# Patient Record
Sex: Female | Born: 1935 | Race: White | Hispanic: No | Marital: Married | Smoking: Former smoker
Health system: Southern US, Community
[De-identification: ages and names within clinical notes are randomized; demographics above are authoritative.]

## PROBLEM LIST (undated history)

## (undated) DIAGNOSIS — I5042 Chronic combined systolic (congestive) and diastolic (congestive) heart failure: Secondary | ICD-10-CM

## (undated) DIAGNOSIS — I493 Ventricular premature depolarization: Secondary | ICD-10-CM

## (undated) DIAGNOSIS — D649 Anemia, unspecified: Secondary | ICD-10-CM

## (undated) DIAGNOSIS — I10 Essential (primary) hypertension: Secondary | ICD-10-CM

## (undated) DIAGNOSIS — T7840XA Allergy, unspecified, initial encounter: Secondary | ICD-10-CM

## (undated) DIAGNOSIS — C50919 Malignant neoplasm of unspecified site of unspecified female breast: Secondary | ICD-10-CM

## (undated) DIAGNOSIS — M1990 Unspecified osteoarthritis, unspecified site: Secondary | ICD-10-CM

## (undated) DIAGNOSIS — Z85828 Personal history of other malignant neoplasm of skin: Secondary | ICD-10-CM

## (undated) DIAGNOSIS — E039 Hypothyroidism, unspecified: Secondary | ICD-10-CM

## (undated) DIAGNOSIS — I499 Cardiac arrhythmia, unspecified: Secondary | ICD-10-CM

## (undated) DIAGNOSIS — C801 Malignant (primary) neoplasm, unspecified: Secondary | ICD-10-CM

## (undated) HISTORY — DX: Chronic combined systolic (congestive) and diastolic (congestive) heart failure: I50.42

## (undated) HISTORY — PX: OTHER SURGICAL HISTORY: SHX169

## (undated) HISTORY — PX: EYE SURGERY: SHX253

## (undated) HISTORY — PX: KNEE ARTHROSCOPY: SHX127

## (undated) HISTORY — PX: APPENDECTOMY: SHX54

## (undated) HISTORY — PX: TONSILLECTOMY: SUR1361

## (undated) HISTORY — PX: CHOLECYSTECTOMY: SHX55

## (undated) HISTORY — PX: ELBOW SURGERY: SHX618

## (undated) HISTORY — DX: Essential (primary) hypertension: I10

---

## 1971-07-26 HISTORY — PX: THYROIDECTOMY: SHX17

## 1988-07-25 HISTORY — PX: ABDOMINAL HYSTERECTOMY: SHX81

## 1998-06-08 ENCOUNTER — Encounter: Admission: RE | Admit: 1998-06-08 | Discharge: 1998-06-08 | Payer: Self-pay | Admitting: Sports Medicine

## 1998-10-16 ENCOUNTER — Ambulatory Visit (HOSPITAL_COMMUNITY): Admission: RE | Admit: 1998-10-16 | Discharge: 1998-10-16 | Payer: Self-pay | Admitting: Obstetrics and Gynecology

## 1998-10-16 ENCOUNTER — Encounter: Payer: Self-pay | Admitting: Obstetrics and Gynecology

## 1999-10-25 ENCOUNTER — Encounter: Payer: Self-pay | Admitting: Obstetrics and Gynecology

## 1999-10-25 ENCOUNTER — Ambulatory Visit (HOSPITAL_COMMUNITY): Admission: RE | Admit: 1999-10-25 | Discharge: 1999-10-25 | Payer: Self-pay | Admitting: Obstetrics and Gynecology

## 2000-11-01 ENCOUNTER — Encounter: Payer: Self-pay | Admitting: Obstetrics and Gynecology

## 2000-11-01 ENCOUNTER — Ambulatory Visit (HOSPITAL_COMMUNITY): Admission: RE | Admit: 2000-11-01 | Discharge: 2000-11-01 | Payer: Self-pay | Admitting: Obstetrics and Gynecology

## 2001-11-15 ENCOUNTER — Encounter: Payer: Self-pay | Admitting: Obstetrics and Gynecology

## 2001-11-15 ENCOUNTER — Ambulatory Visit (HOSPITAL_COMMUNITY): Admission: RE | Admit: 2001-11-15 | Discharge: 2001-11-15 | Payer: Self-pay | Admitting: Obstetrics and Gynecology

## 2002-11-18 ENCOUNTER — Encounter: Payer: Self-pay | Admitting: Obstetrics and Gynecology

## 2002-11-18 ENCOUNTER — Ambulatory Visit (HOSPITAL_COMMUNITY): Admission: RE | Admit: 2002-11-18 | Discharge: 2002-11-18 | Payer: Self-pay | Admitting: Obstetrics and Gynecology

## 2003-12-02 ENCOUNTER — Ambulatory Visit (HOSPITAL_COMMUNITY): Admission: RE | Admit: 2003-12-02 | Discharge: 2003-12-02 | Payer: Self-pay | Admitting: Obstetrics and Gynecology

## 2003-12-09 ENCOUNTER — Encounter: Admission: RE | Admit: 2003-12-09 | Discharge: 2003-12-09 | Payer: Self-pay | Admitting: Internal Medicine

## 2003-12-09 IMAGING — US US SOFT TISSUE HEAD/NECK
1 series · 14 of 14 positions shown · non-contrast
Comparison: none

CLINICAL DATA: Thyroid dysfunction. Patient has a history of a left thyroidectomy and patient is on thyroid medication. 
 THYROID ULTRASOUND 
 No thyroid tissue was identified.  No definite neck mass.  There is a prominent right jugular vein noted. 
 IMPRESSION
 1.  No thyroid tissue is visualized.

[Series 1: unknown · 0.11mm/px · 14 of 14 slices shown]
[im 1/14]
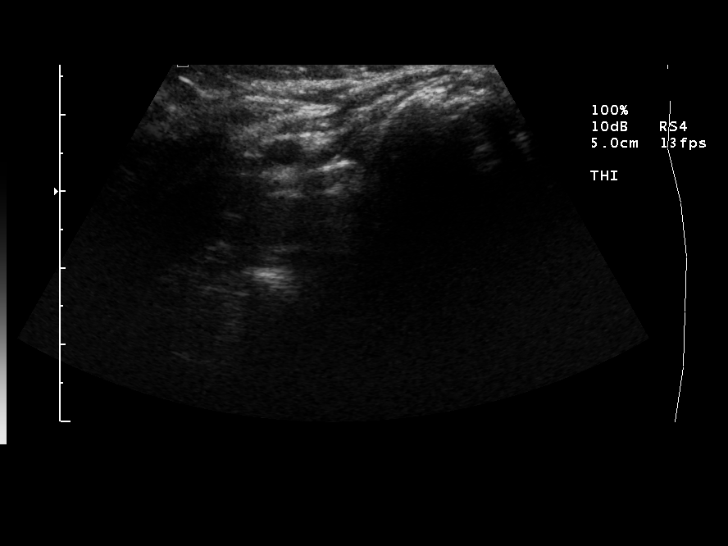
[im 2/14]
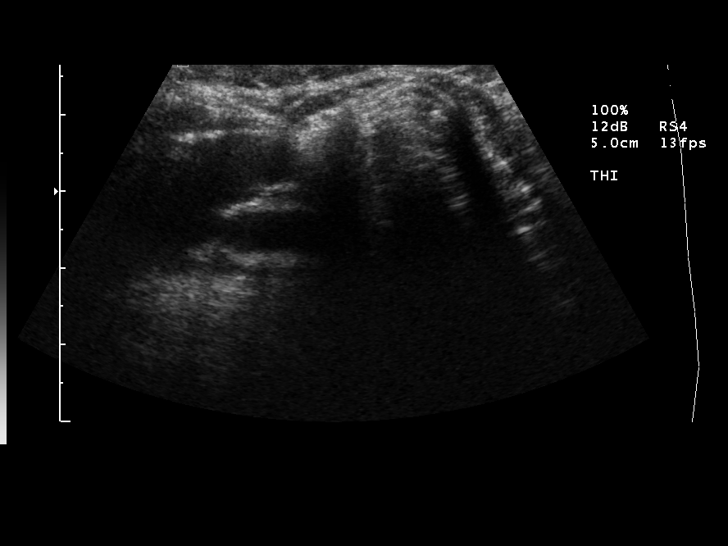
[im 3/14]
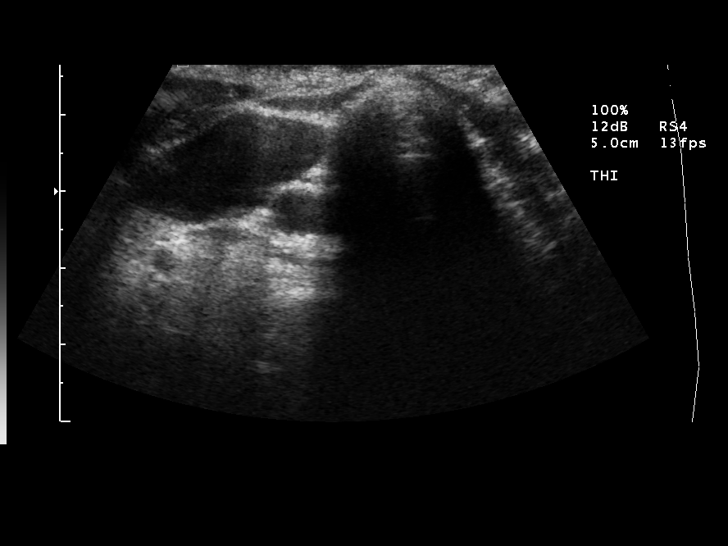
[im 4/14]
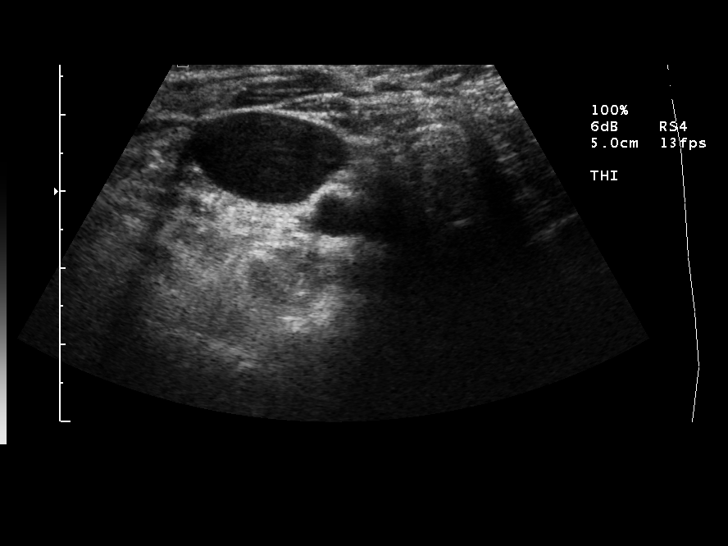
[im 5/14]
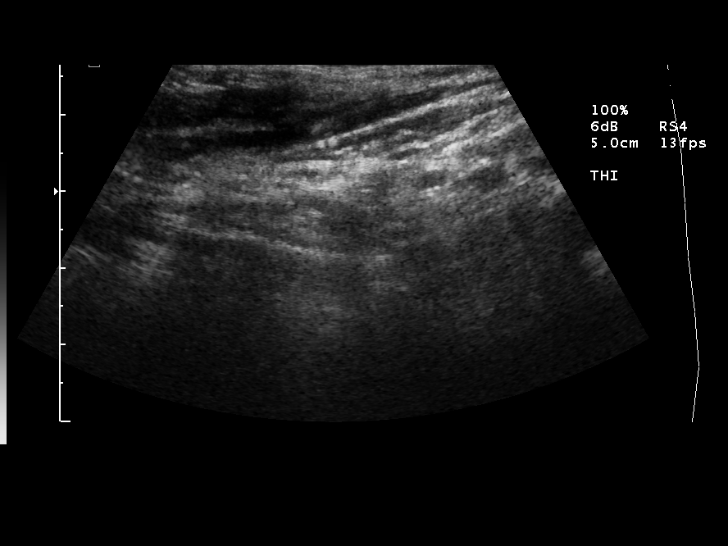
[im 6/14]
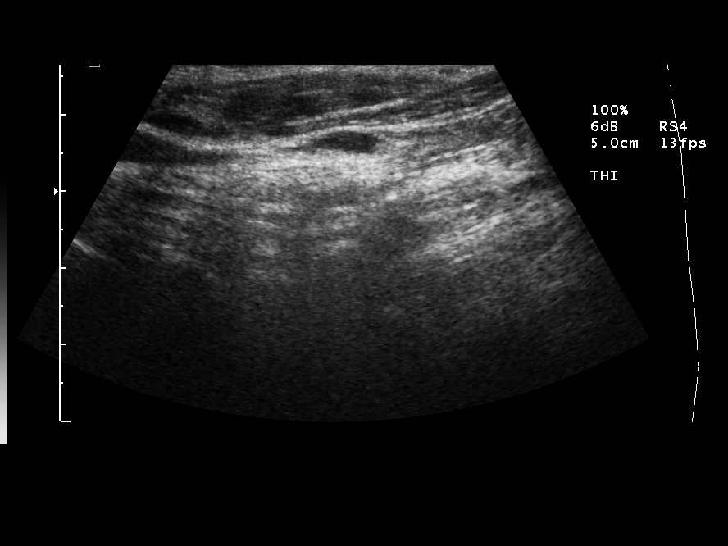
[im 7/14]
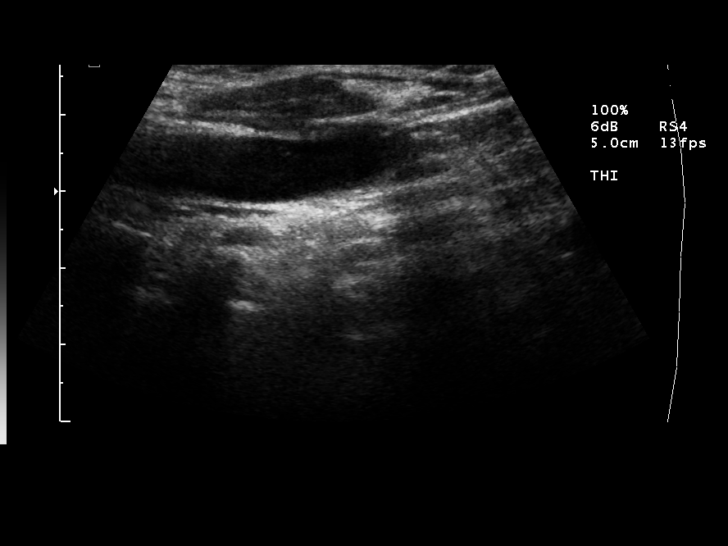
[im 8/14]
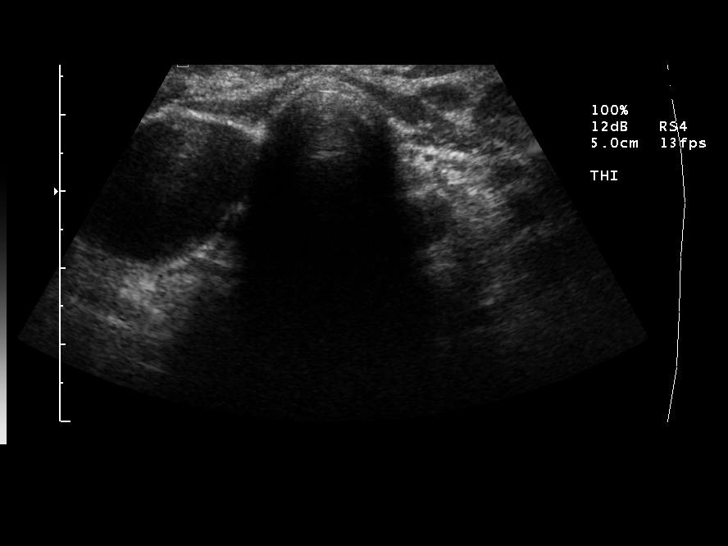
[im 9/14]
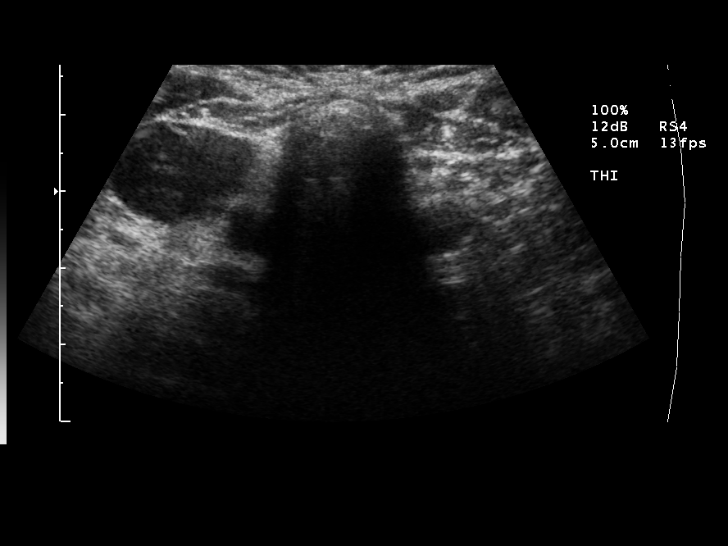
[im 10/14]
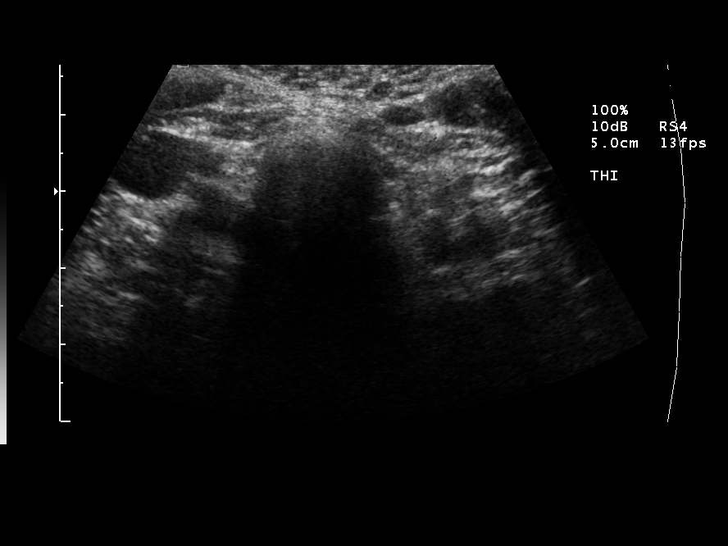
[im 11/14]
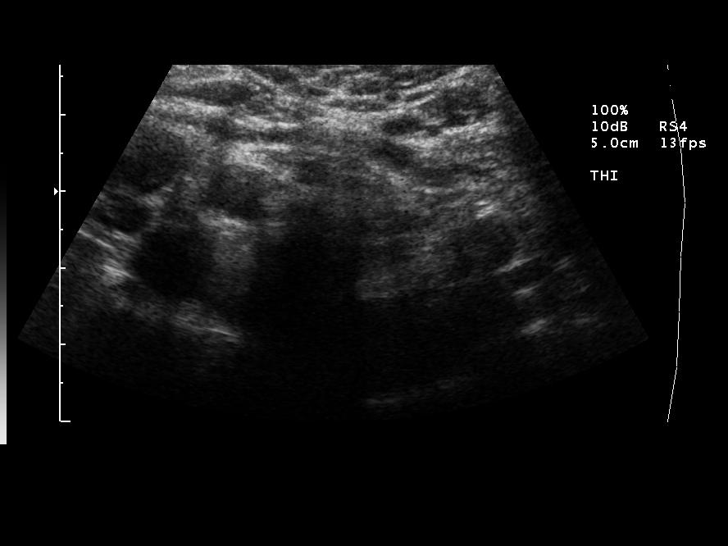
[im 12/14]
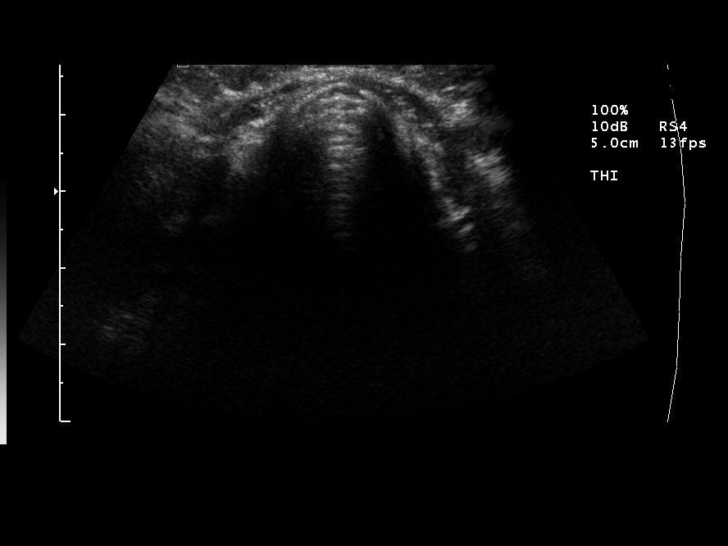
[im 13/14]
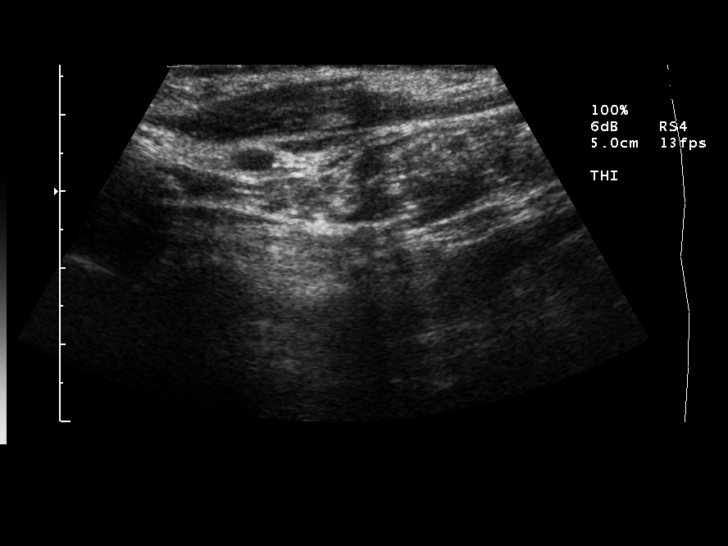
[im 14/14]
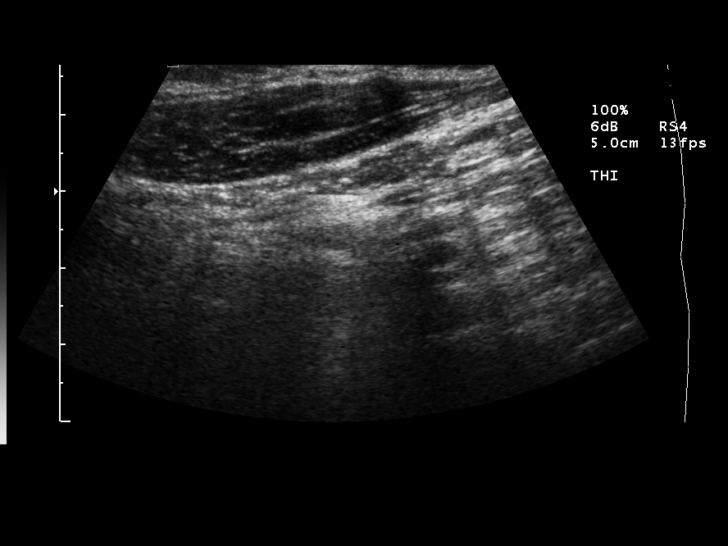

[14 of 14 positions shown; findings below may reference images not displayed]

## 2004-04-12 ENCOUNTER — Encounter (INDEPENDENT_AMBULATORY_CARE_PROVIDER_SITE_OTHER): Payer: Self-pay | Admitting: *Deleted

## 2004-04-12 ENCOUNTER — Ambulatory Visit (HOSPITAL_COMMUNITY): Admission: RE | Admit: 2004-04-12 | Discharge: 2004-04-12 | Payer: Self-pay | Admitting: Gastroenterology

## 2004-12-22 ENCOUNTER — Ambulatory Visit (HOSPITAL_COMMUNITY): Admission: RE | Admit: 2004-12-22 | Discharge: 2004-12-22 | Payer: Self-pay | Admitting: Obstetrics and Gynecology

## 2005-12-26 ENCOUNTER — Ambulatory Visit (HOSPITAL_COMMUNITY): Admission: RE | Admit: 2005-12-26 | Discharge: 2005-12-26 | Payer: Self-pay | Admitting: Obstetrics and Gynecology

## 2005-12-26 IMAGING — MG MM DIGITAL SCREENING BILAT
4 series · 4 of 4 positions shown · non-contrast
Comparison: none

DG SCREEN MAMMOGRAM BILATERAL
Bilateral CC and MLO view(s) were taken.

SCREENING MAMMOGRAM:
There is a fibroglandular pattern.  No masses or malignant type calcifications are identified.  
Compared with prior studies.

[R CC]
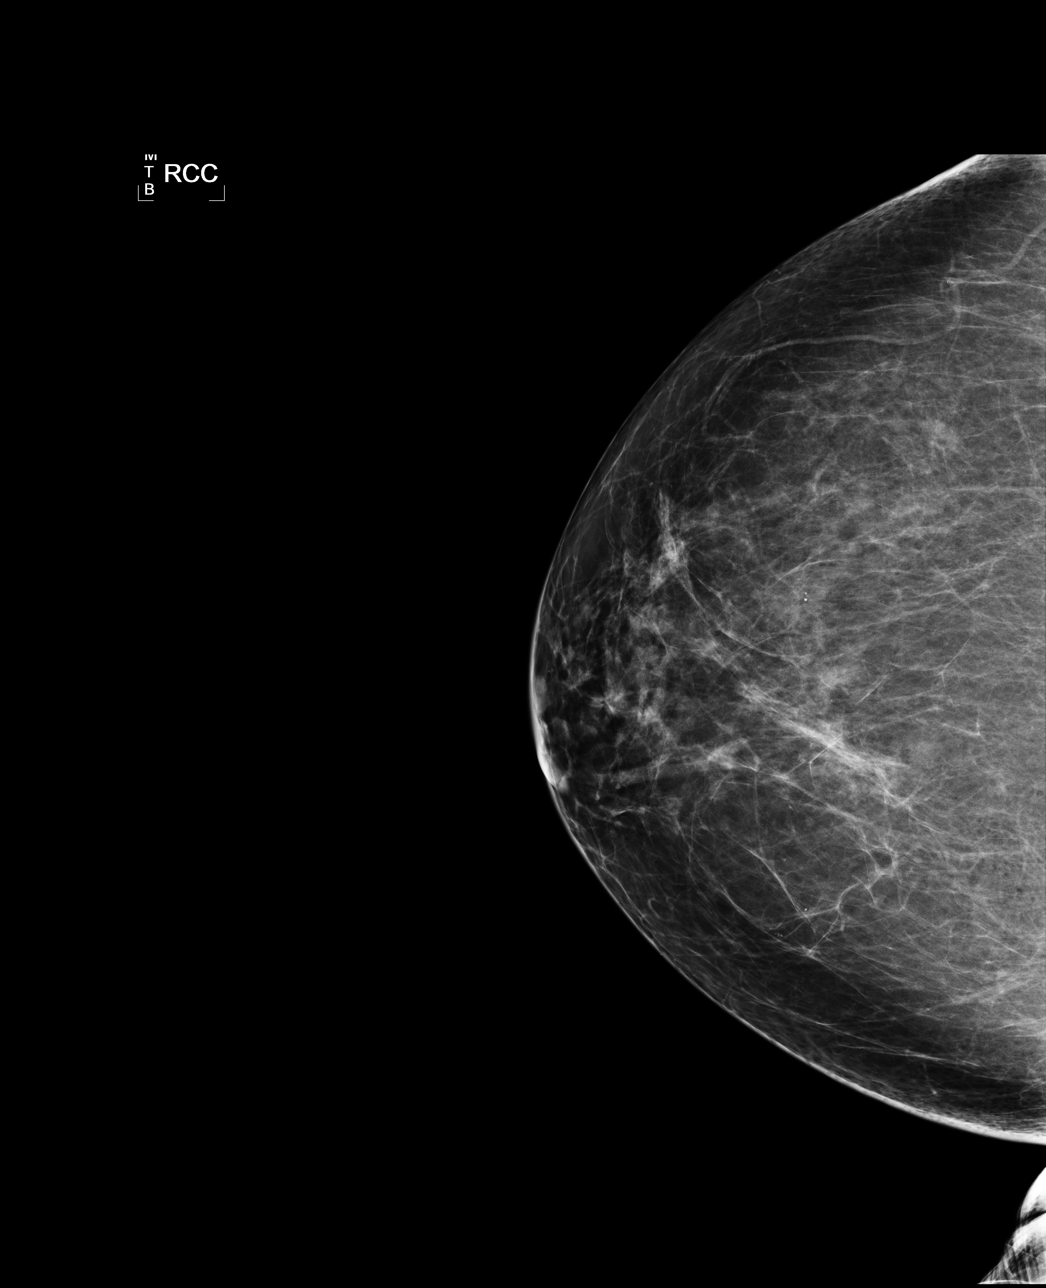

[R MLO]
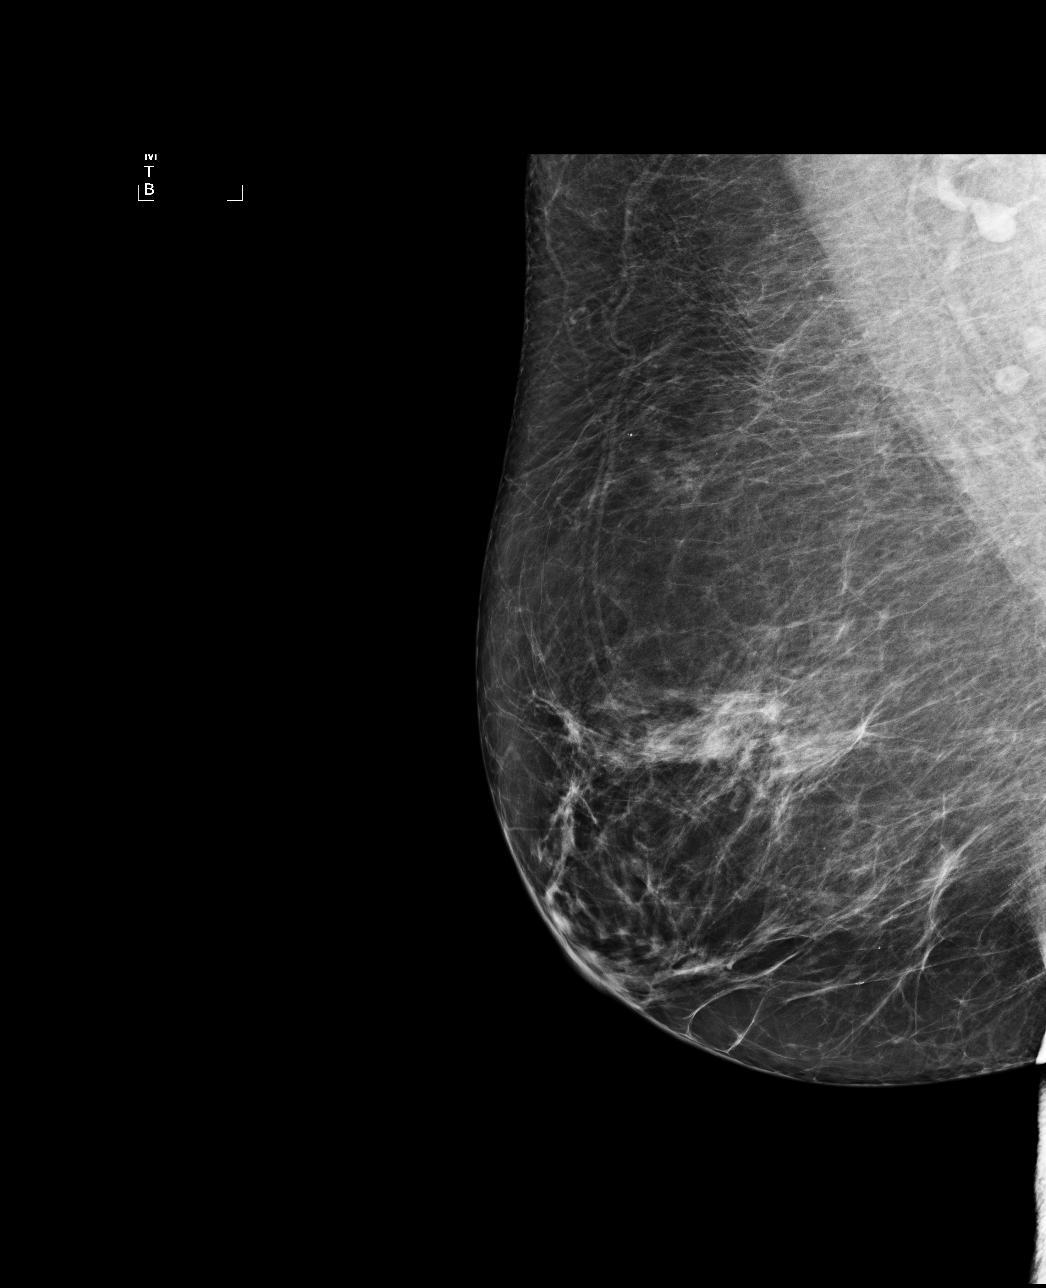

[L CC]
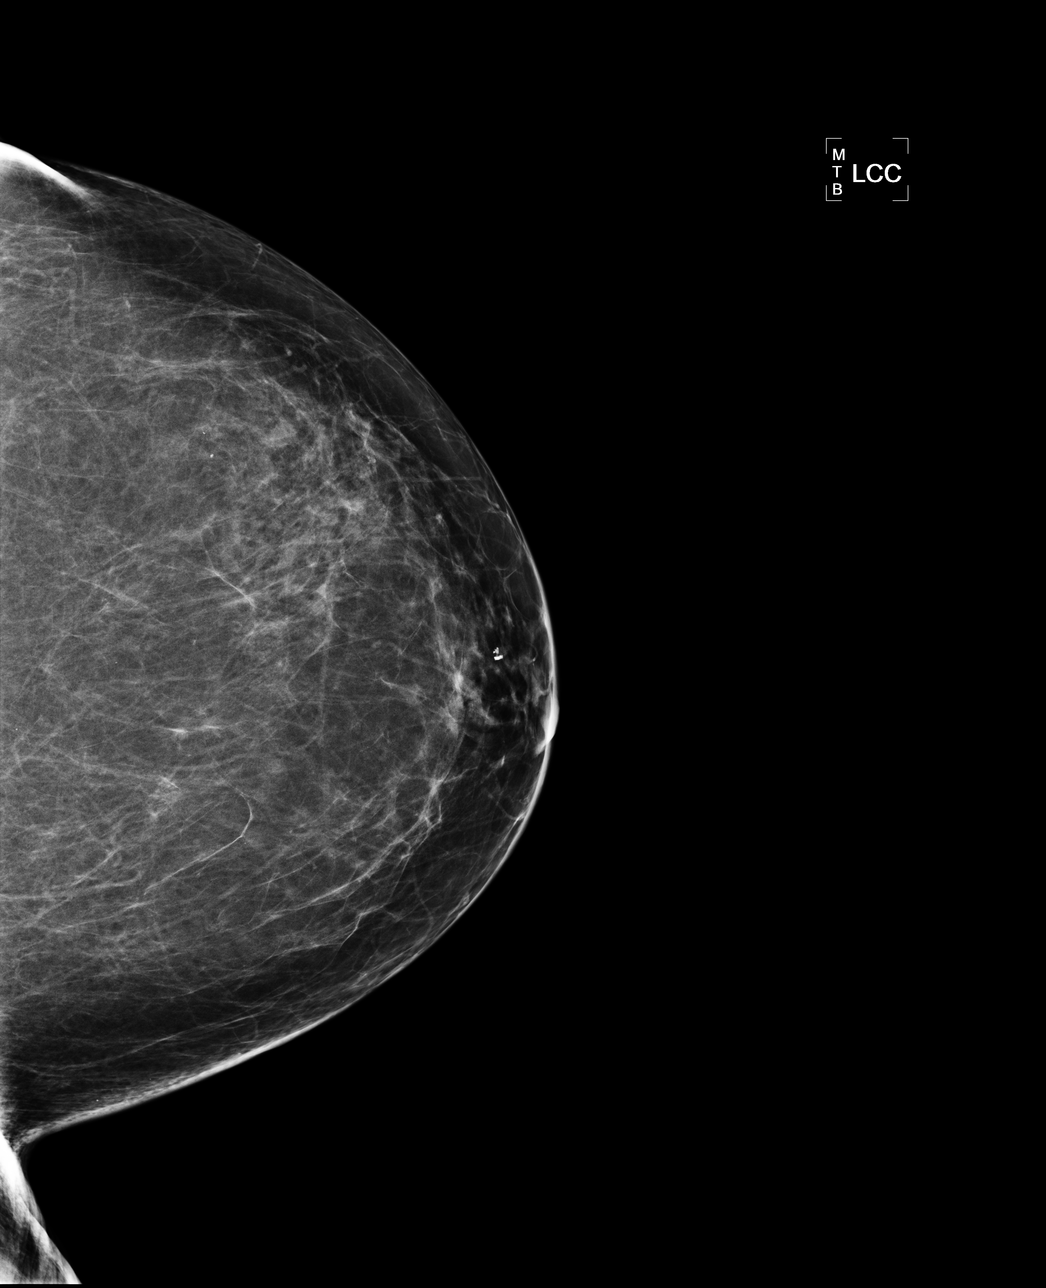

[L MLO]
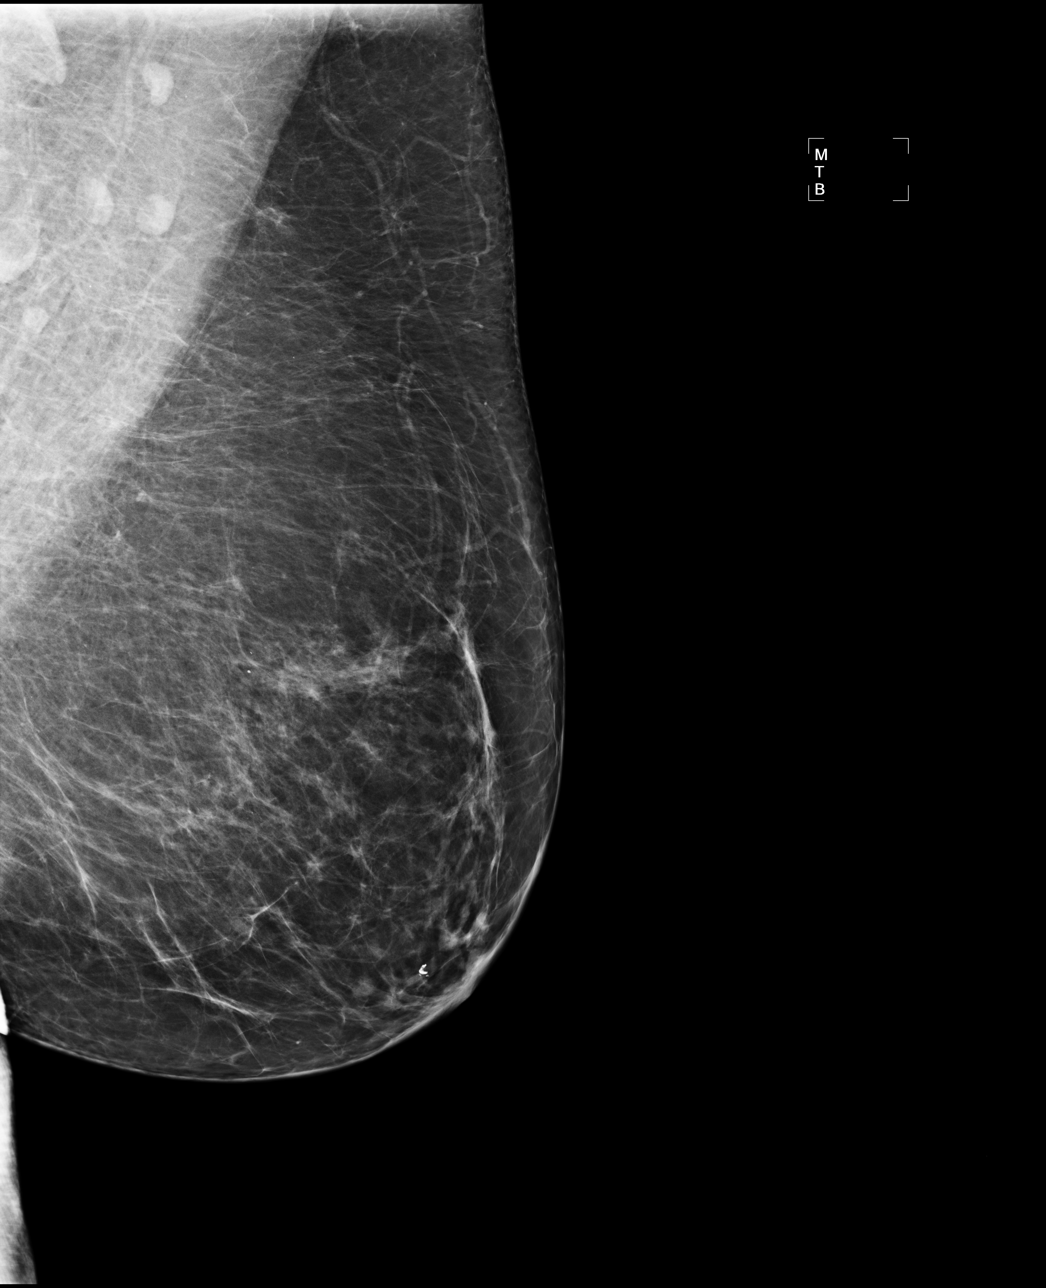

[4 of 4 positions shown; findings below may reference images not displayed]

IMPRESSION: No specific mammographic evidence of malignancy.  Next screening mammogram is recommended in one 
year.

ASSESSMENT: Negative - BI-RADS 1

Screening mammogram in 1 year.
ANALYZED BY COMPUTER AIDED DETECTION. , THIS PROCEDURE WAS A DIGITAL MAMMOGRAM.

## 2006-12-28 ENCOUNTER — Ambulatory Visit (HOSPITAL_COMMUNITY): Admission: RE | Admit: 2006-12-28 | Discharge: 2006-12-28 | Payer: Self-pay | Admitting: Obstetrics and Gynecology

## 2006-12-28 IMAGING — MG MM DIGITAL SCREENING BILAT
4 series · 4 of 4 positions shown · non-contrast
Comparison: none

DG SCREEN MAMMOGRAM BILATERAL
Bilateral CC and MLO view(s) were taken.

DIGITAL SCREENING MAMMOGRAM WITH CAD:
There is a fibroglandular pattern.  No masses or malignant type calcifications are identified.  
Compared with prior studies.

[R CC]
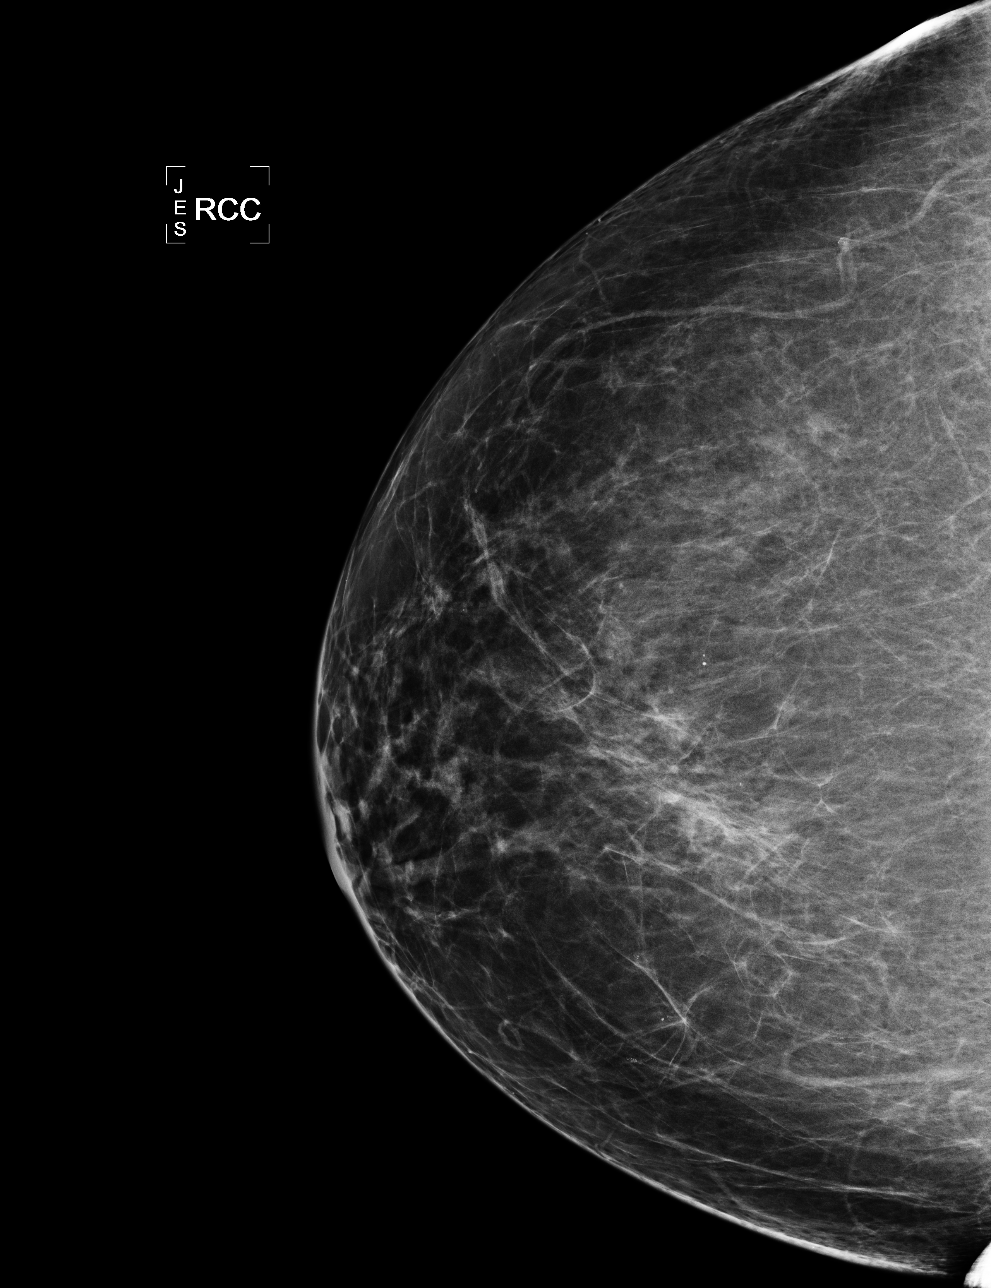

[R MLO]
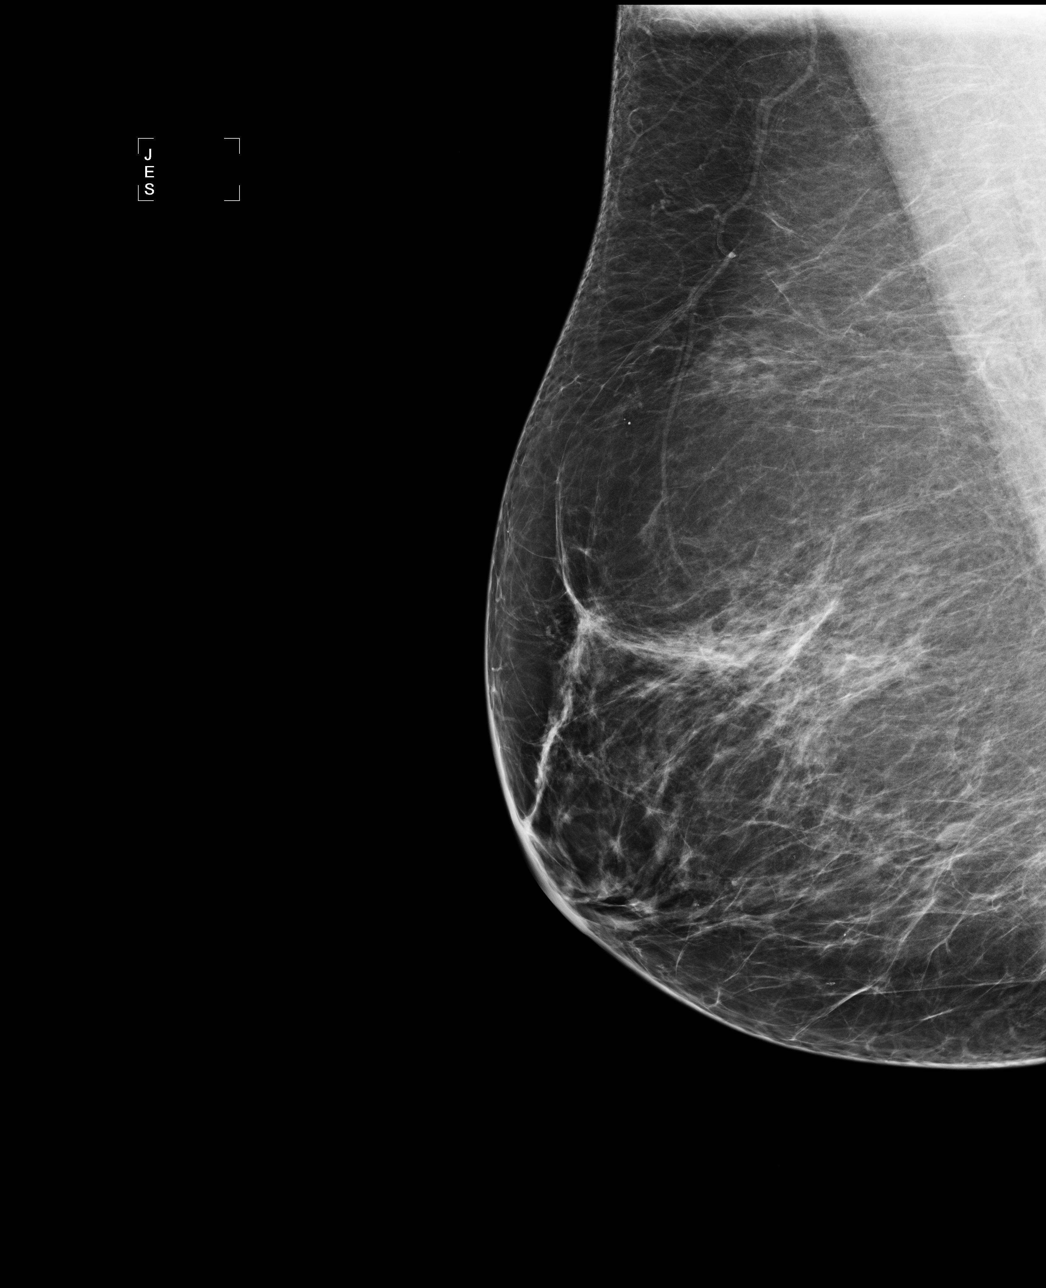

[L CC]
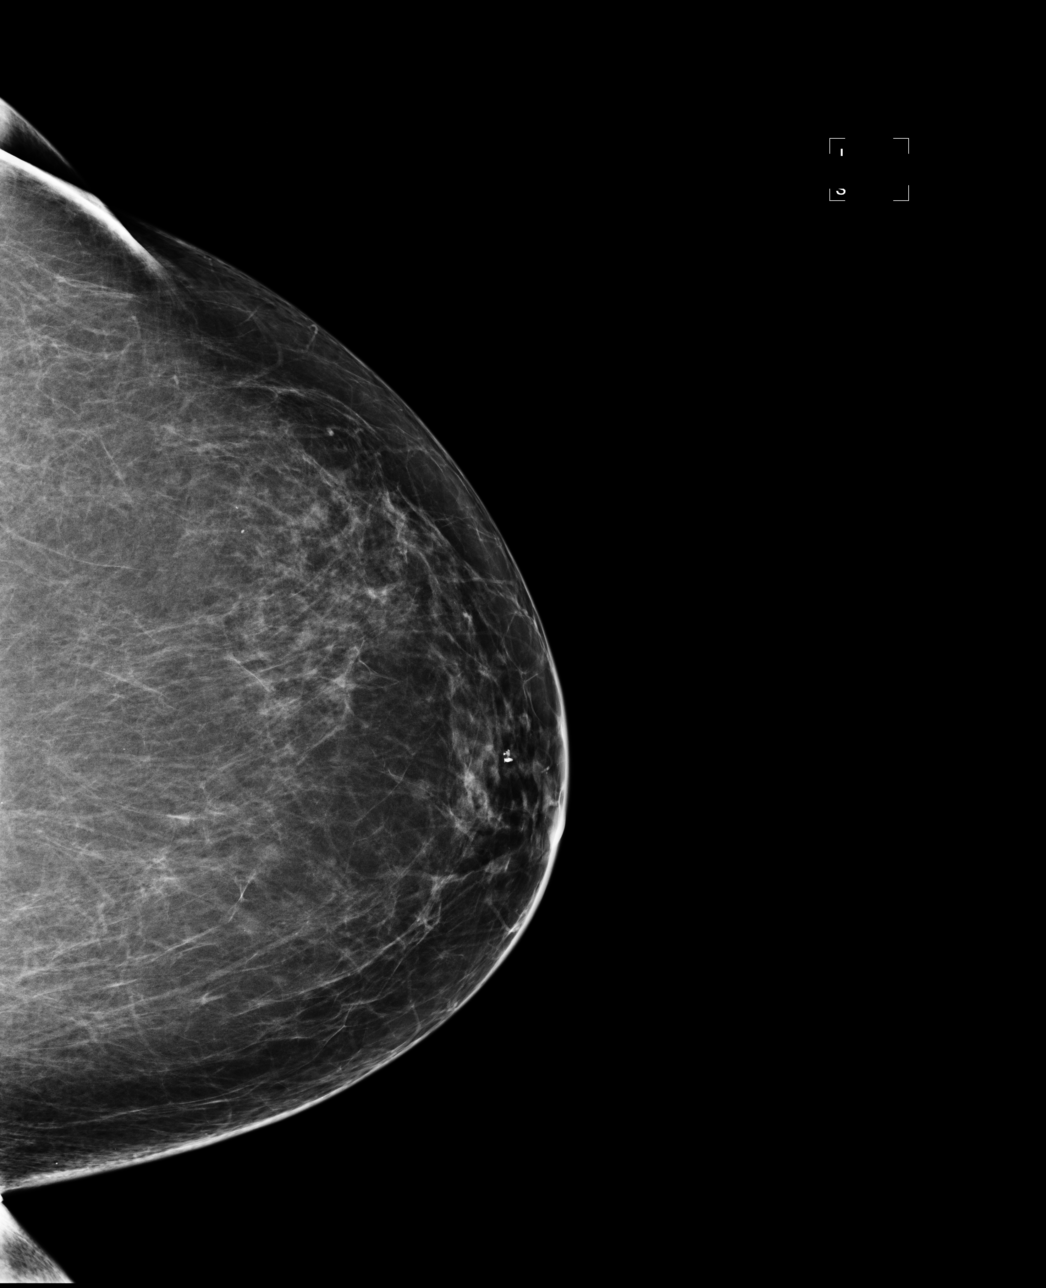

[L MLO]
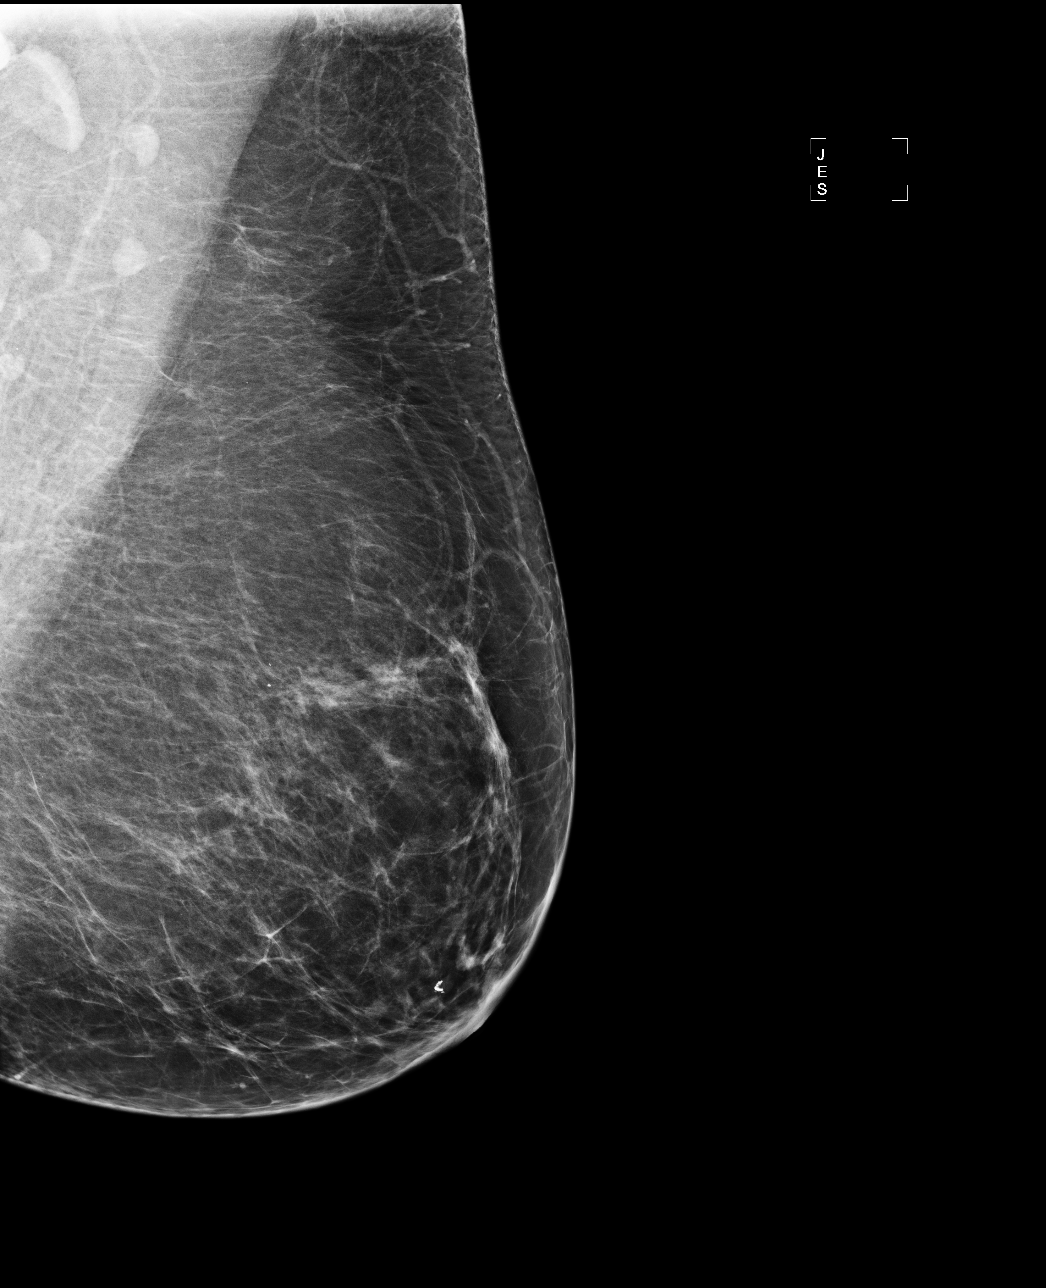

[4 of 4 positions shown; findings below may reference images not displayed]

IMPRESSION: No specific mammographic evidence of malignancy.  Next screening mammogram is recommended in one 
year.

ASSESSMENT: Negative - BI-RADS 1

Screening mammogram in 1 year.
ANALYZED BY COMPUTER AIDED DETECTION. , THIS PROCEDURE WAS A DIGITAL MAMMOGRAM.

## 2007-01-31 ENCOUNTER — Ambulatory Visit (HOSPITAL_COMMUNITY): Admission: RE | Admit: 2007-01-31 | Discharge: 2007-01-31 | Payer: Self-pay | Admitting: Internal Medicine

## 2007-01-31 IMAGING — RF DG UGI W/ HIGH DENSITY W/KUB
15 of 18 series · 15 of 18 positions shown · non-contrast
Comparison: none

CLINICAL DATA: Left upper quadrant pain radiating to back. 
UPPER GI SERIES WITH HIGH DENSITY WITH KUB:

[Series 1: run · 1 of 1 slices shown (1 of 14)]
[im 1/1]
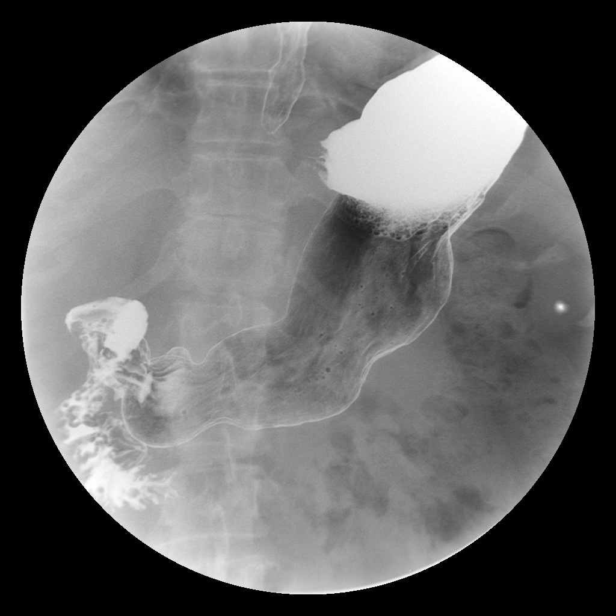

[Series 2: run · 1 of 1 slices shown (2 of 14)]
[im 1/1]
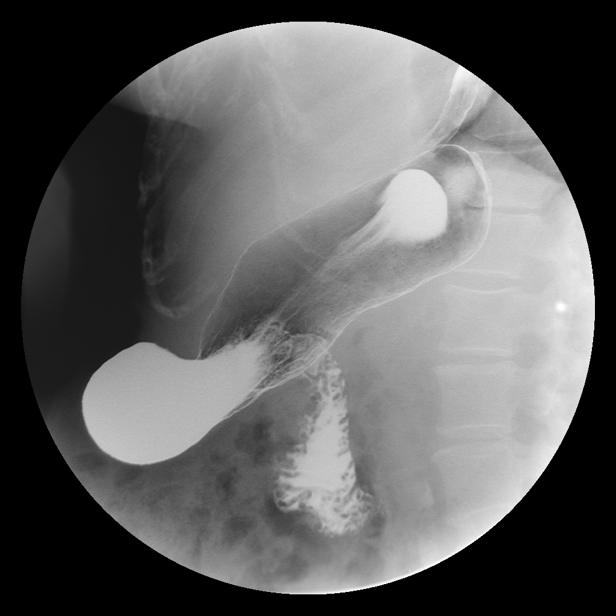

[Series 4: run · 1 of 1 slices shown (3 of 14)]
[im 1/1]
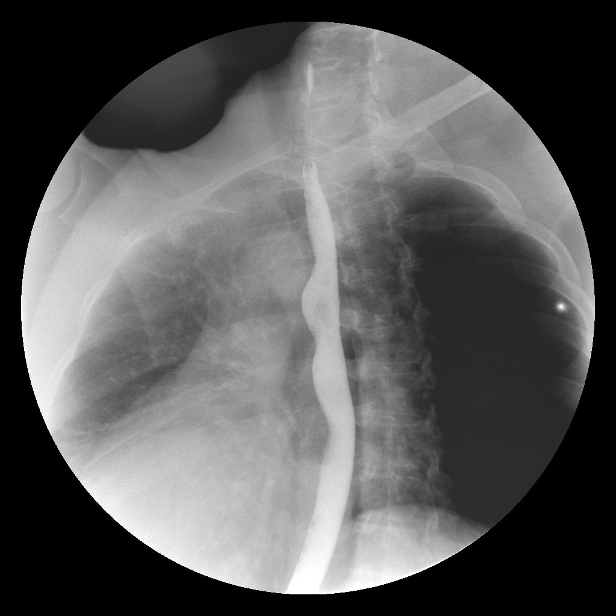

[Series 5: run · 1 of 1 slices shown (4 of 14)]
[im 1/1]
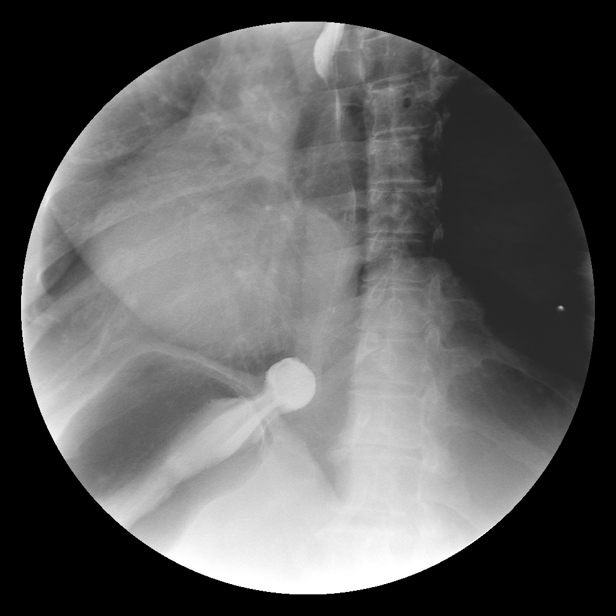

[Series 6: run · 1 of 1 slices shown (5 of 14)]
[im 1/1]
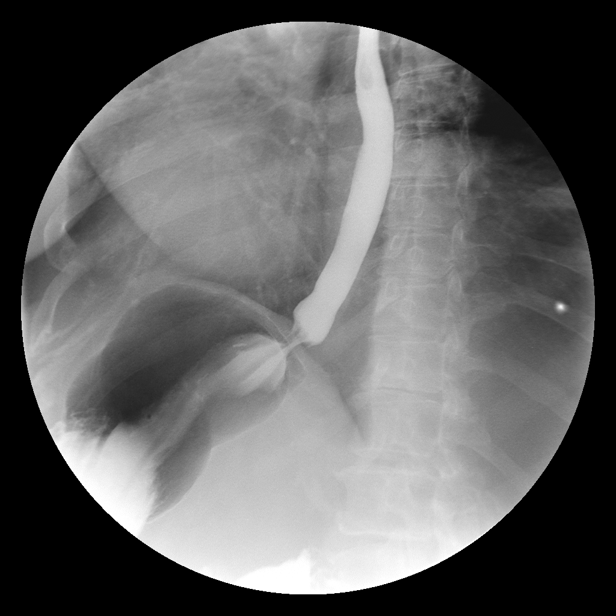

[Series 7: run · 1 of 1 slices shown (6 of 14)]
[im 1/1]
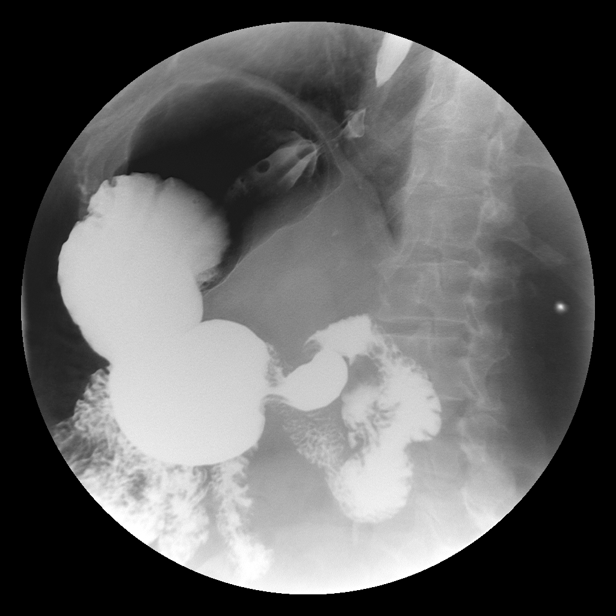

[Series 8: run · 1 of 1 slices shown (7 of 14)]
[im 1/1]
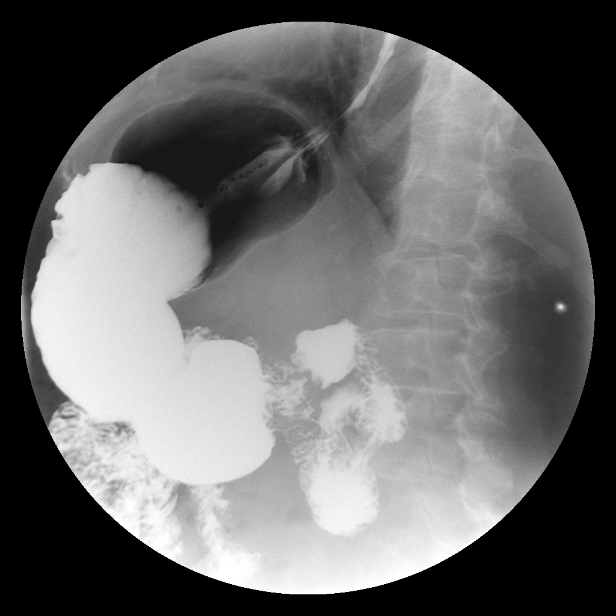

[Series 10: run · 1 of 1 slices shown (8 of 14)]
[im 1/1]
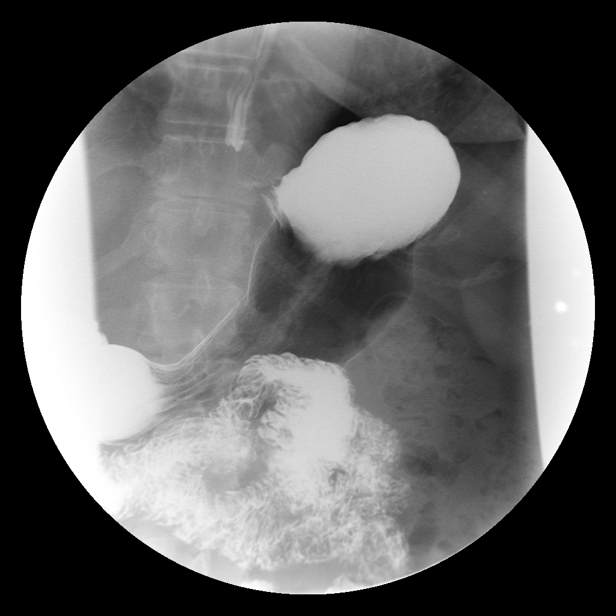

[Series 11: run · 1 of 1 slices shown (9 of 14)]
[im 1/1]
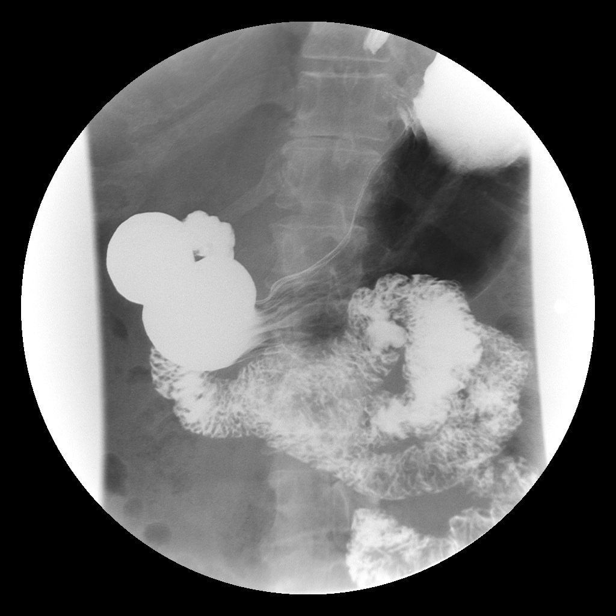

[Series 12: run · 1 of 1 slices shown (10 of 14)]
[im 1/1]
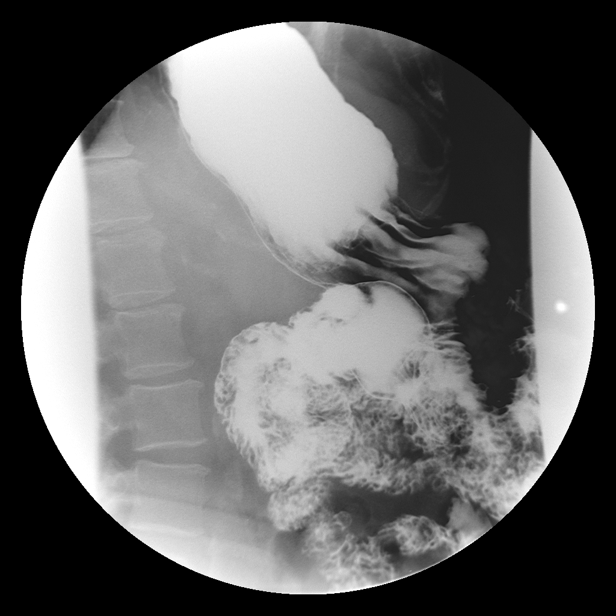

[Series 13: run · 1 of 1 slices shown (11 of 14)]
[im 1/1]
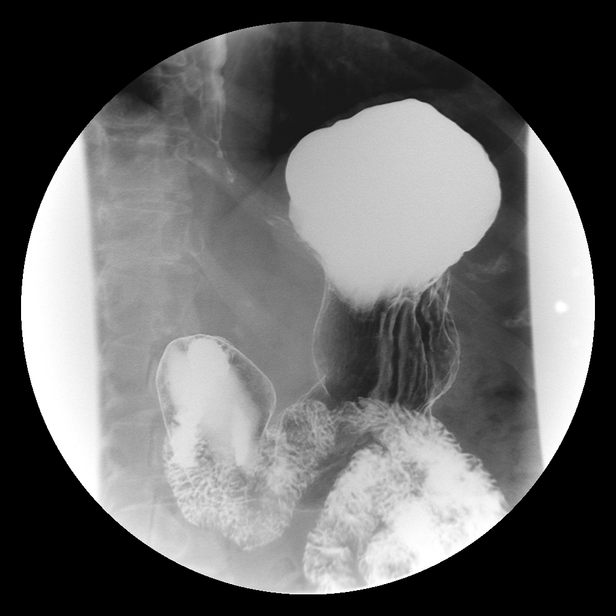

[Series 14: run · 1 of 1 slices shown (12 of 14)]
[im 1/1]
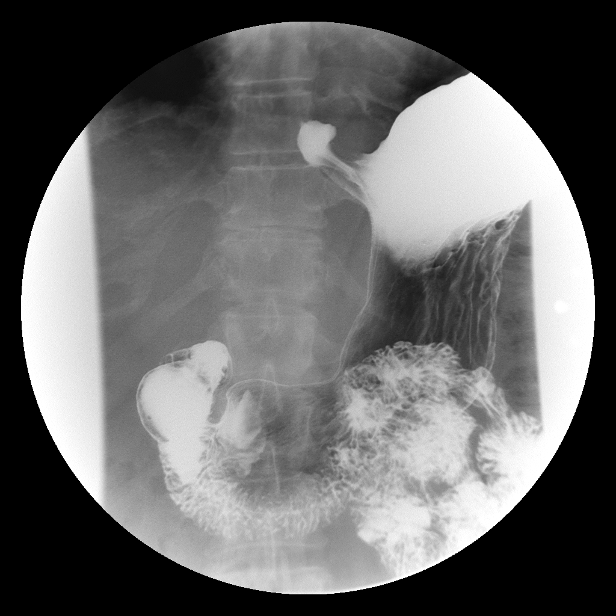

[Series 16: run · 1 of 1 slices shown (13 of 14)]
[im 1/1]
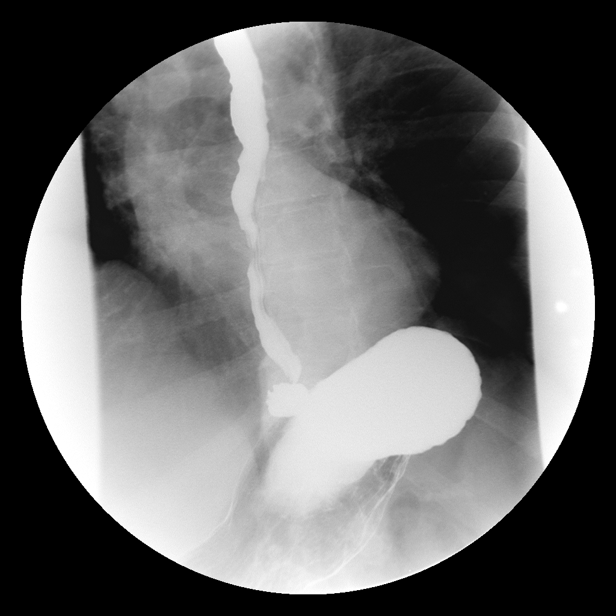

[Series 17: run · 1 of 1 slices shown (14 of 14)]
[im 1/1]
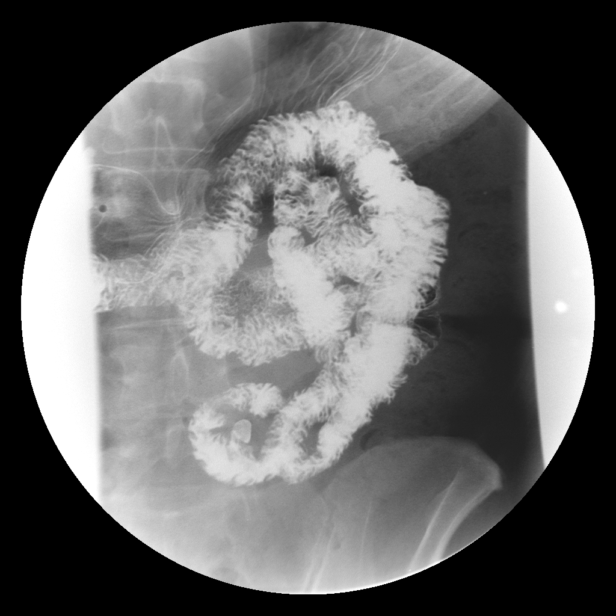

[Series 1001: view not recorded · 0.20mm/px · 1 of 1 slices shown]
[im 1/1]
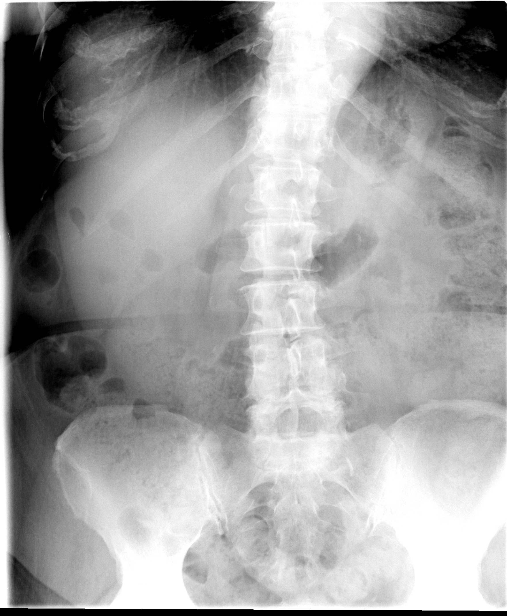

[15 of 18 positions shown; findings below may reference images not displayed]

FINDINGS: Preliminary scout film reveals a generous amount of stool in the colon.  The bowel gas pattern is unremarkable.  Esophageal motility and contour are normal.  Minimal hiatal hernia.  Marked gastroesophageal reflux.  Barium refluxes from the stomach up into the proximal esophagus.  No gastric or duodenal mass or ulceration.  Diverticulum extends off the medial aspect of the descending duodenum.  There is also a small diverticulum in the proximal small bowel.
IMPRESSION: Question constipation.  Minimal hiatal hernia.  Marked gastroesophageal reflux.  No stricture.  No ulcer.  The are a couple of benign diverticula, one arising from the duodenum and the other from the proximal small bowel.

## 2008-01-10 ENCOUNTER — Encounter: Admission: RE | Admit: 2008-01-10 | Discharge: 2008-01-10 | Payer: Self-pay | Admitting: Obstetrics and Gynecology

## 2008-01-10 IMAGING — MG MM SCREEN MAMMOGRAM BILATERAL
5 series · 5 of 5 positions shown · non-contrast
Comparison: none

DG SCREEN MAMMOGRAM BILATERAL
Bilateral CC and MLO view(s) were taken.

DIGITAL SCREENING MAMMOGRAM WITH CAD:
There are scattered fibroglandular densities.  No masses or malignant type calcifications are 
identified.  Compared with prior studies.

[R CC]
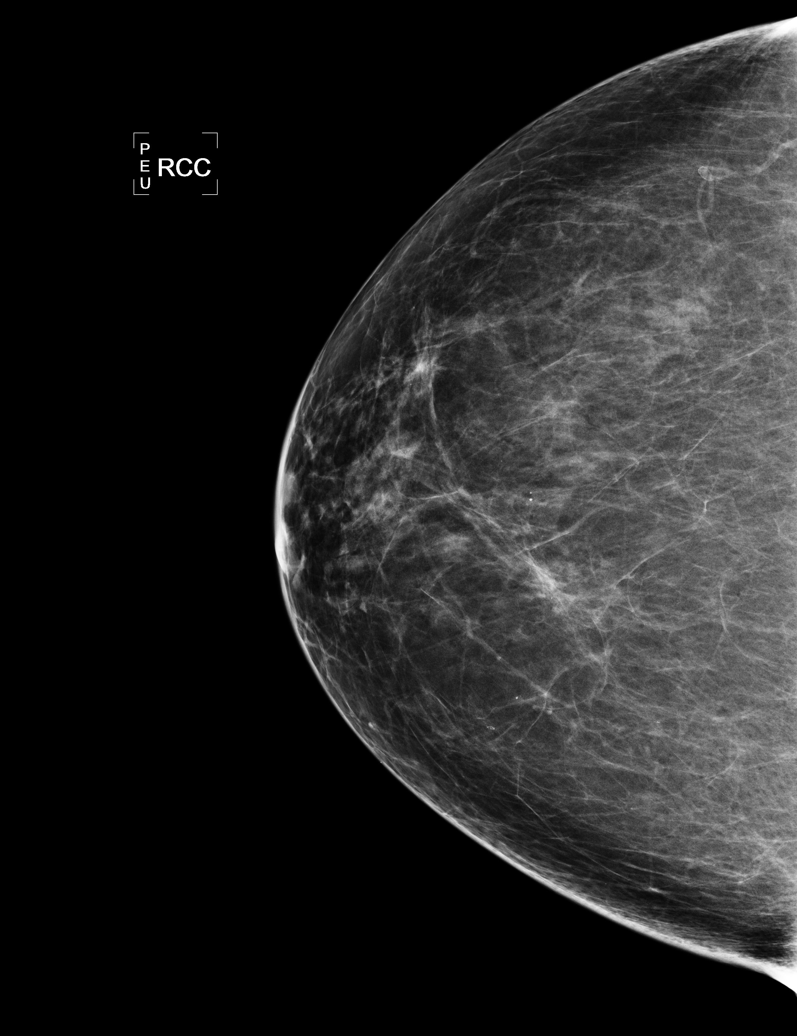

[L CC]
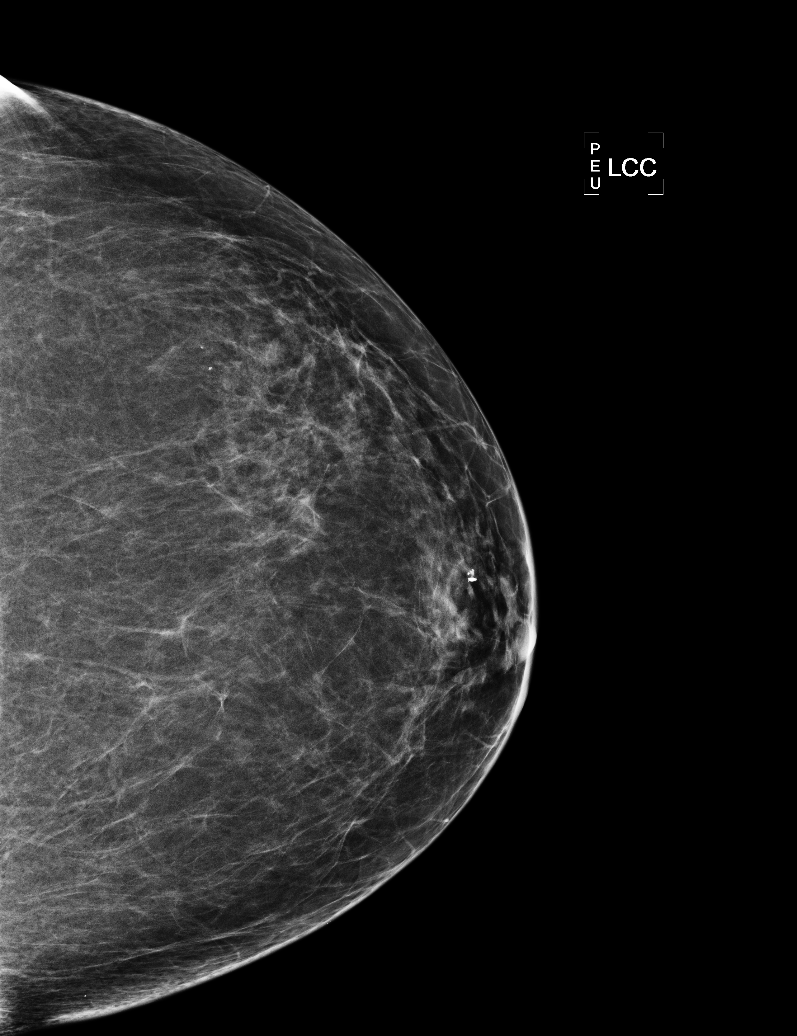

[L MLO]
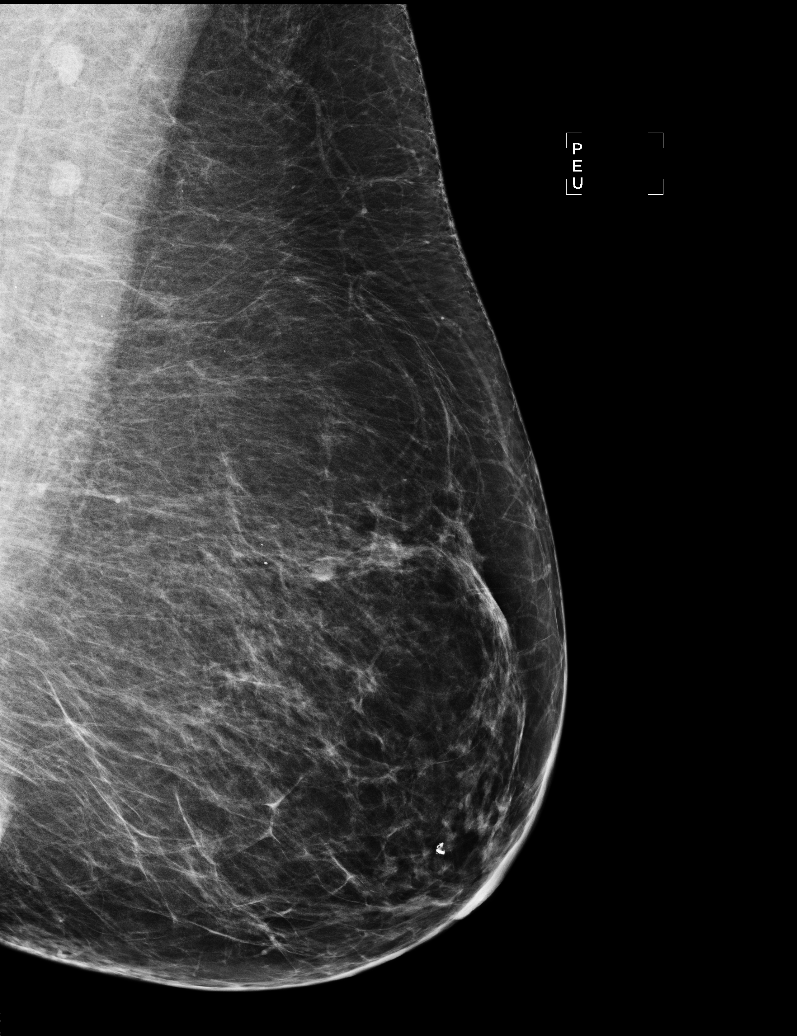

[R MLO (1 of 2)]
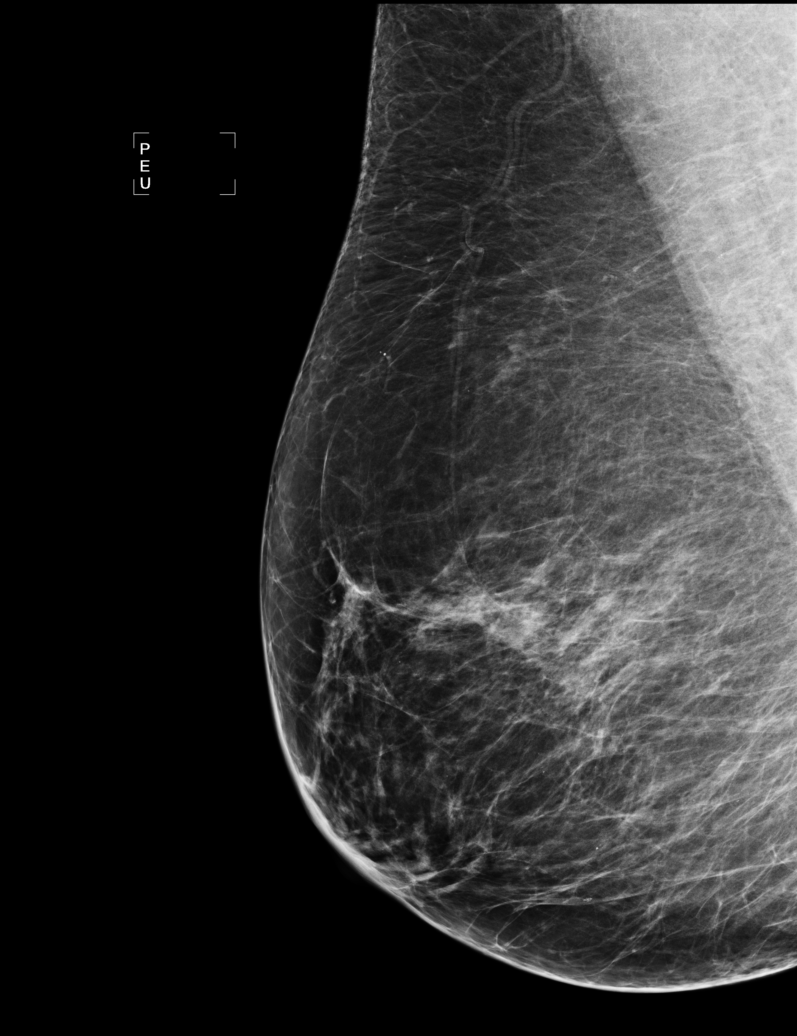

[R MLO (2 of 2)]
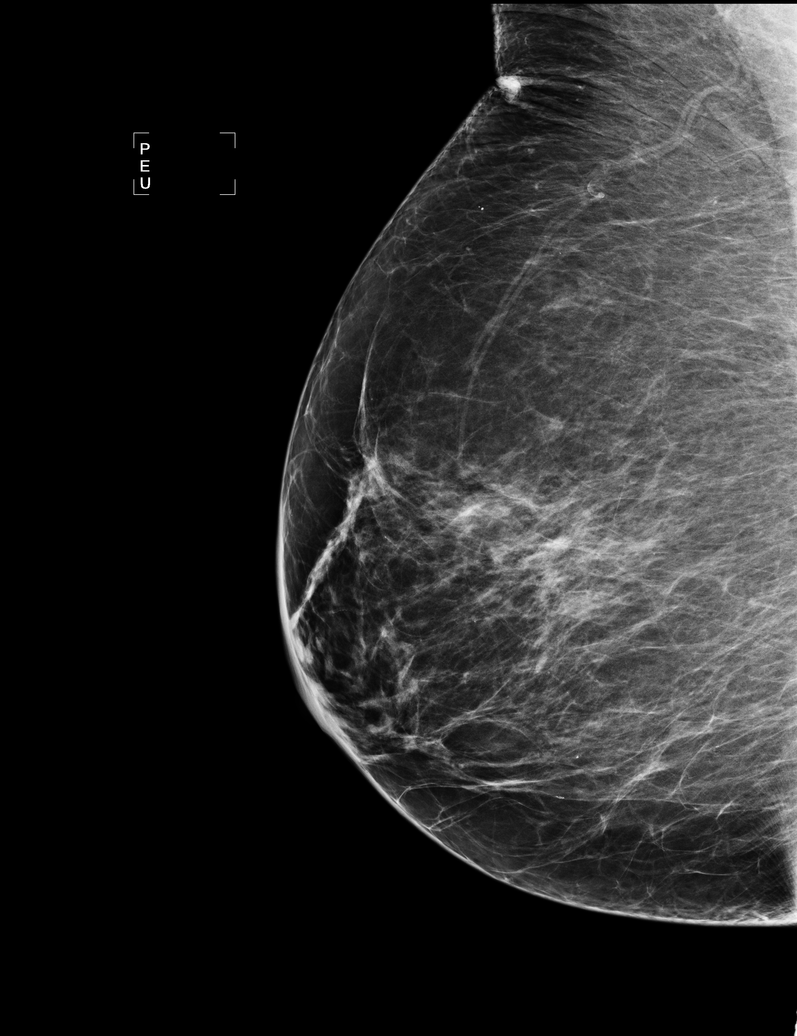

[5 of 5 positions shown; findings below may reference images not displayed]

IMPRESSION: No specific mammographic evidence of malignancy.  Next screening mammogram is recommended in one 
year.

ASSESSMENT: Negative - BI-RADS 1

Screening mammogram in 1 year.
ANALYZED BY COMPUTER AIDED DETECTION. , THIS PROCEDURE WAS A DIGITAL MAMMOGRAM.

## 2008-08-08 LAB — HM PAP SMEAR: HM Pap smear: NORMAL

## 2009-01-27 ENCOUNTER — Encounter: Admission: RE | Admit: 2009-01-27 | Discharge: 2009-01-27 | Payer: Self-pay | Admitting: Obstetrics and Gynecology

## 2009-01-27 IMAGING — MG MM SCREEN MAMMOGRAM BILATERAL
4 series · 4 of 4 positions shown · non-contrast
Comparison: none

DG SCREEN MAMMOGRAM BILATERAL
Bilateral CC and MLO view(s) were taken.

DIGITAL SCREENING MAMMOGRAM WITH CAD:
There are scattered fibroglandular densities.  No masses or malignant type calcifications are 
identified.  Compared with prior studies.
Images were processed with CAD.

[R CC]
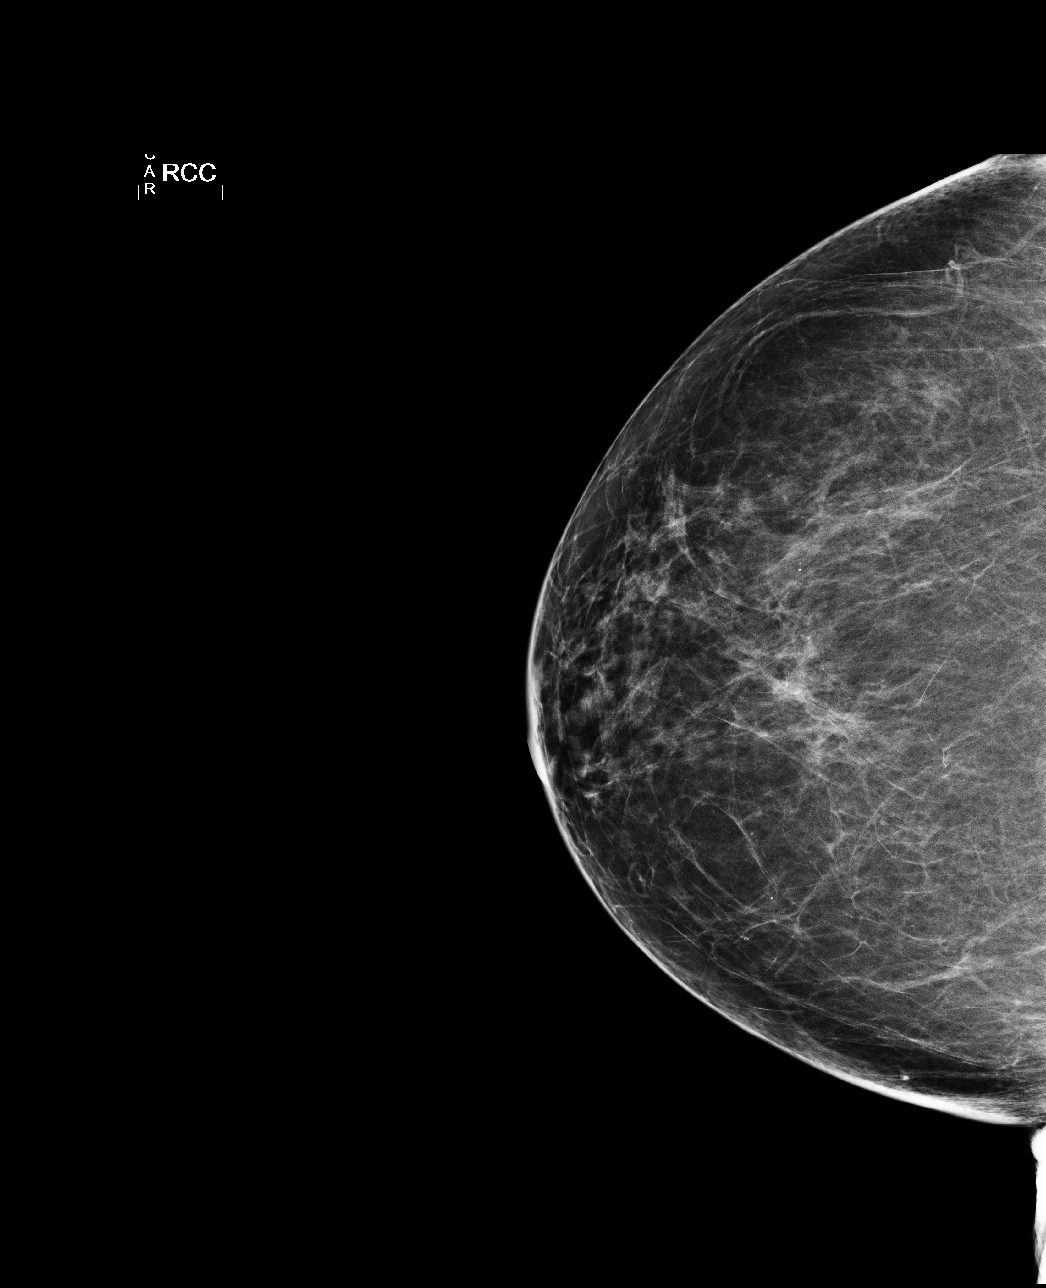

[L CC]
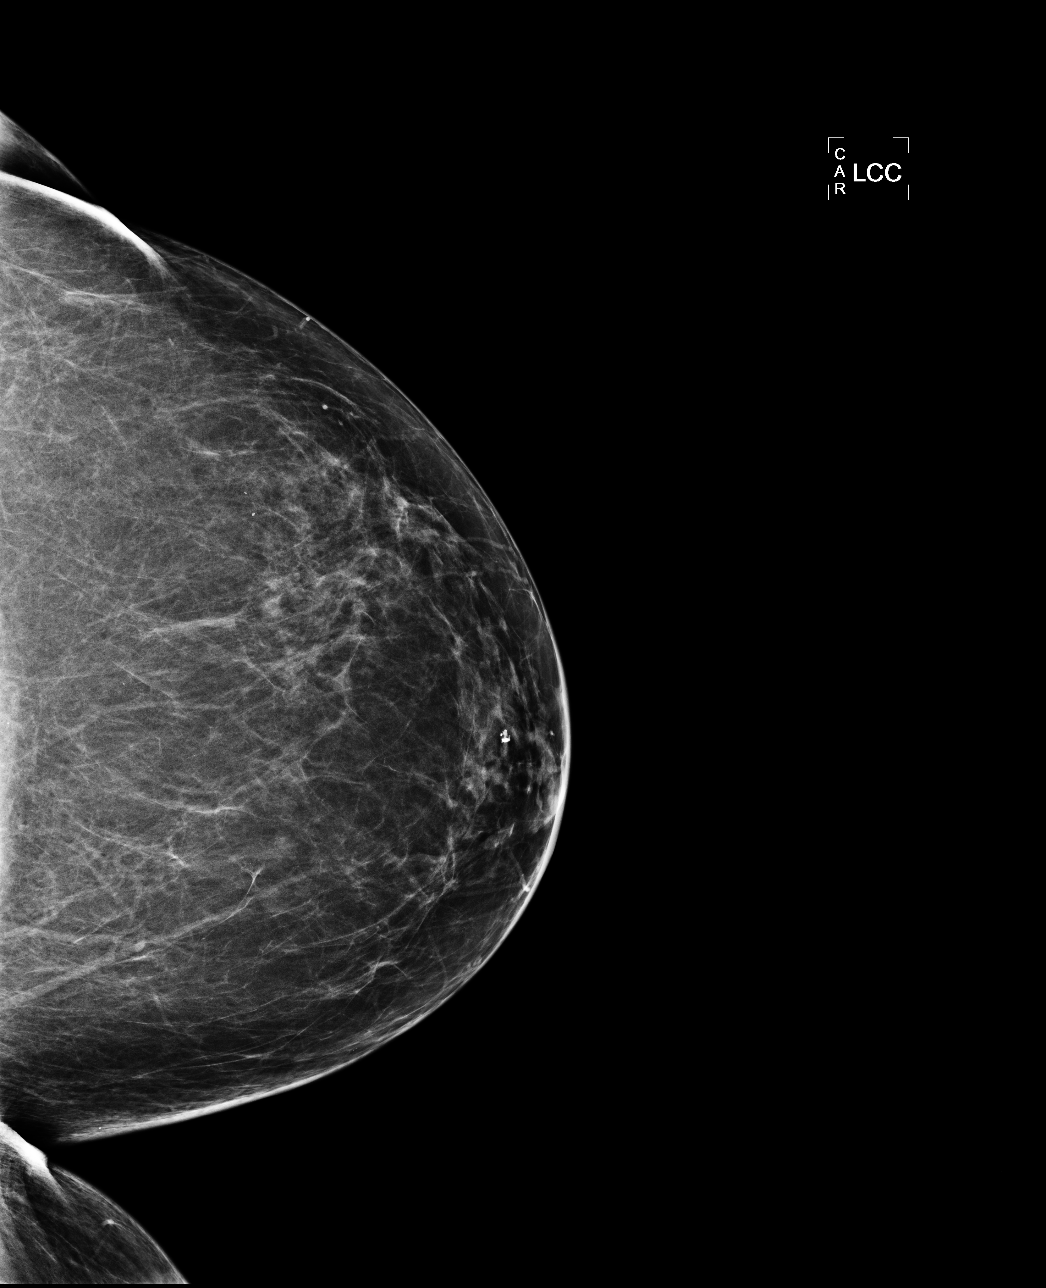

[L MLO]
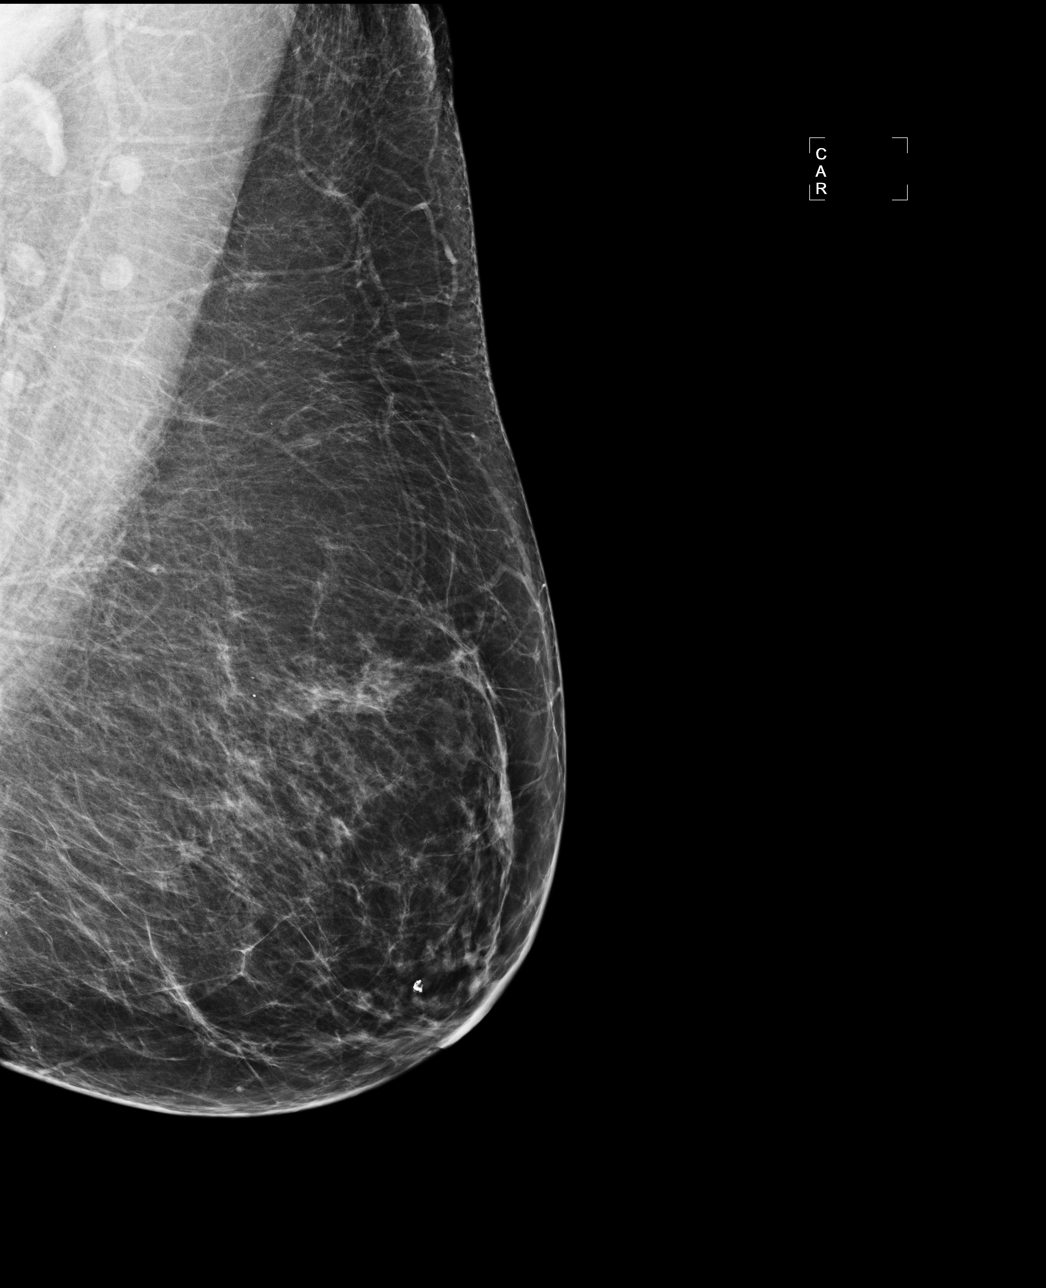

[R MLO]
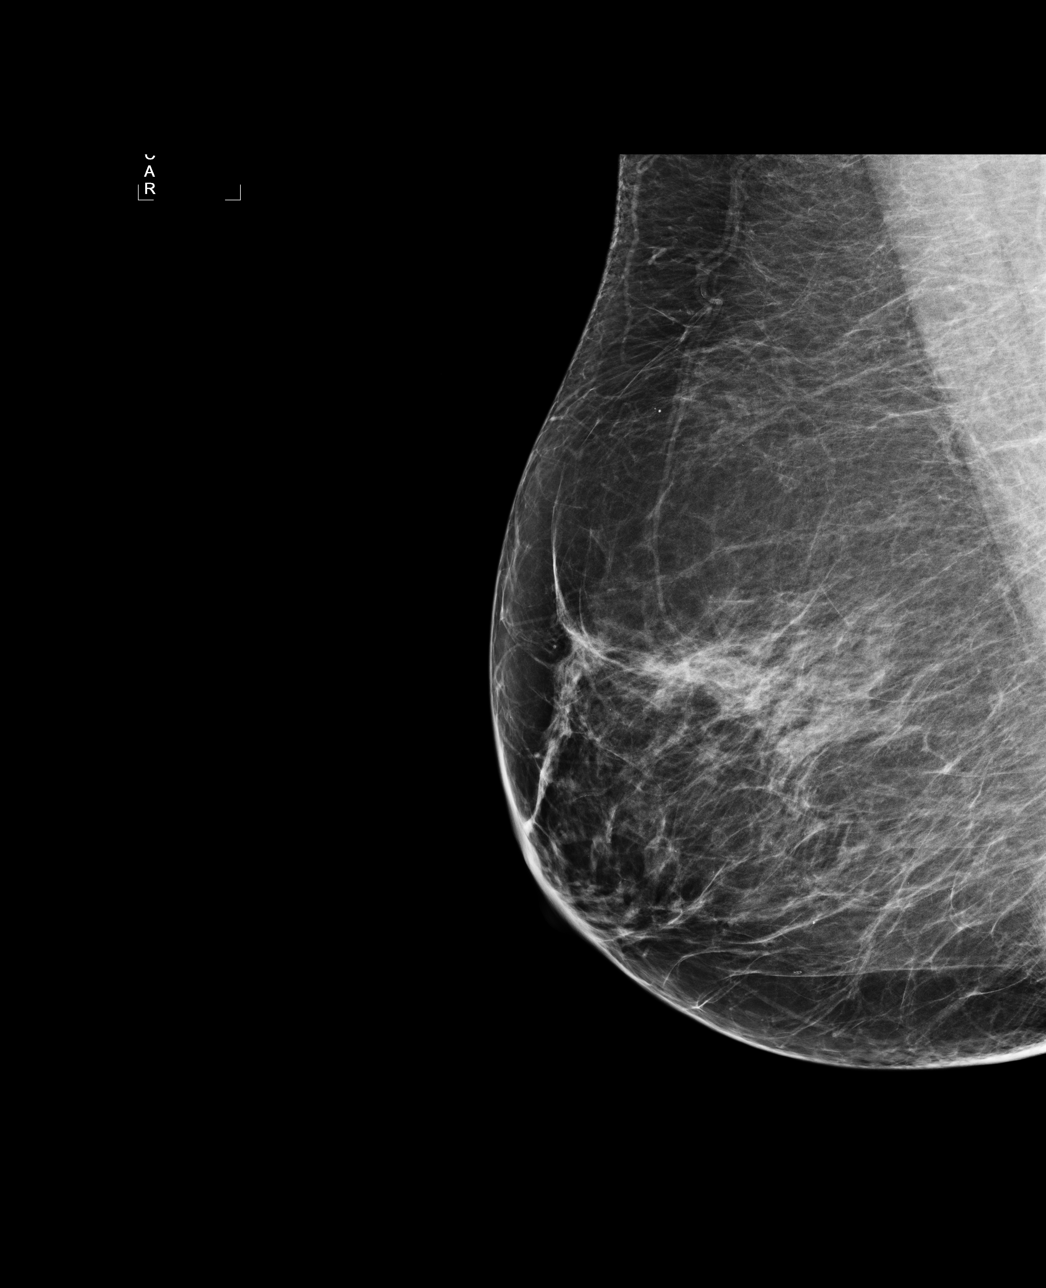

[4 of 4 positions shown; findings below may reference images not displayed]

IMPRESSION: No specific mammographic evidence of malignancy.  Next screening mammogram is recommended in one 
year.

A result letter of this screening mammogram will be mailed directly to the patient.

ASSESSMENT: Negative - BI-RADS 1

Screening mammogram in 1 year.
ANALYZED BY COMPUTER AIDED DETECTION. , THIS PROCEDURE WAS A DIGITAL MAMMOGRAM.

## 2010-02-01 ENCOUNTER — Encounter: Admission: RE | Admit: 2010-02-01 | Discharge: 2010-02-01 | Payer: Self-pay | Admitting: Obstetrics and Gynecology

## 2010-02-01 LAB — HM MAMMOGRAPHY: HM Mammogram: NORMAL

## 2010-02-01 IMAGING — MG MM DIGITAL SCREENING
4 series · 4 of 4 positions shown · non-contrast
Comparison: Prior studies.

DG SCREEN MAMMOGRAM BILATERAL
Bilateral CC and MLO view(s) were taken.
Prior study comparison: December 28, 2006, DG screen mammogram bilateral, performed at [facility]
at [facility]

DIGITAL SCREENING MAMMOGRAM WITH CAD:

[R CC]
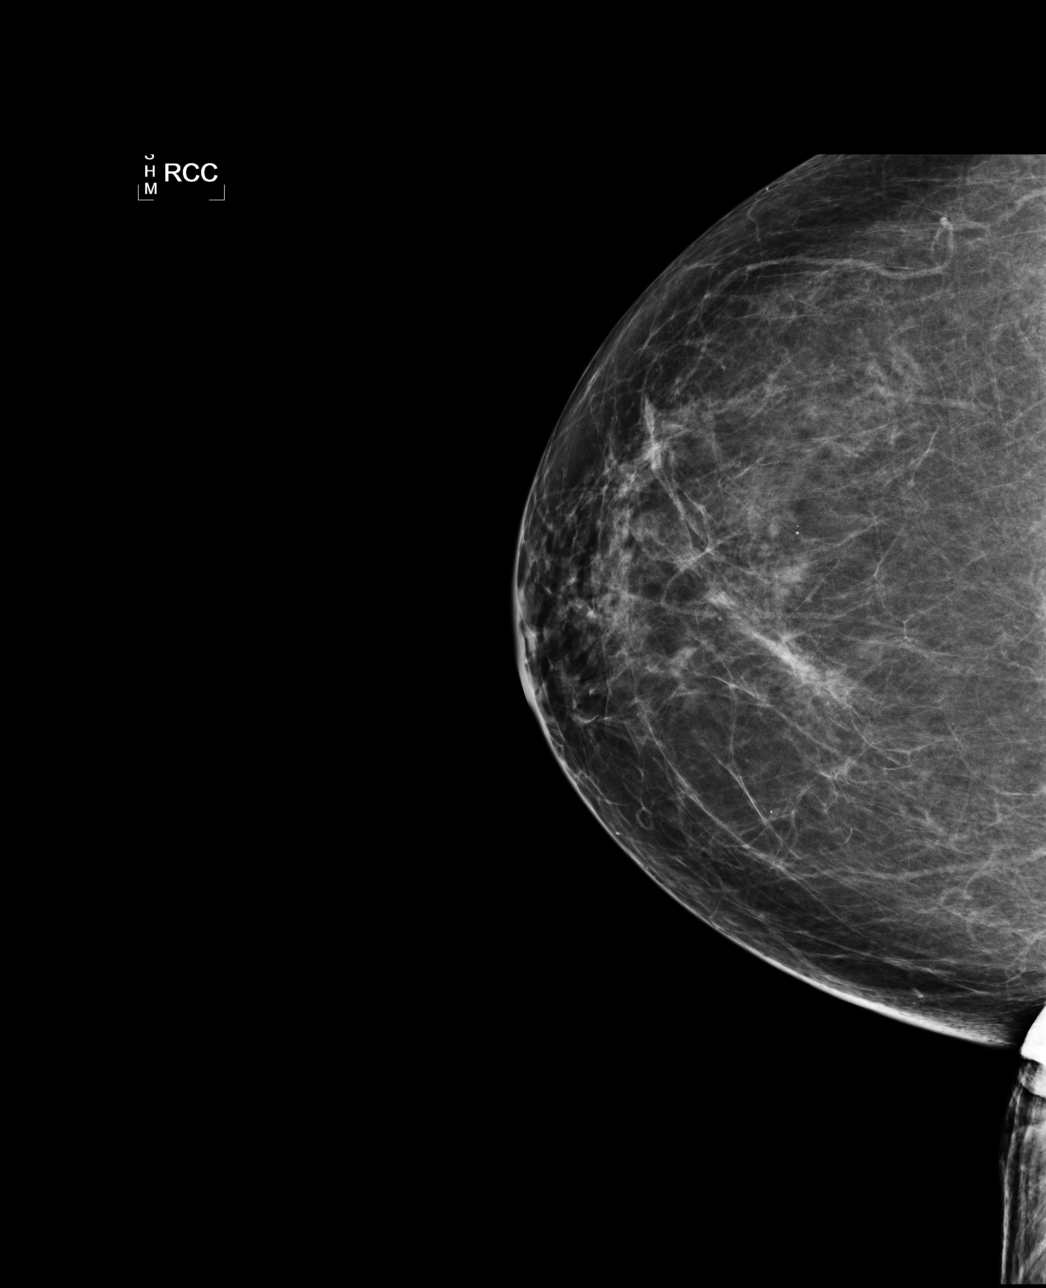

[L CC]
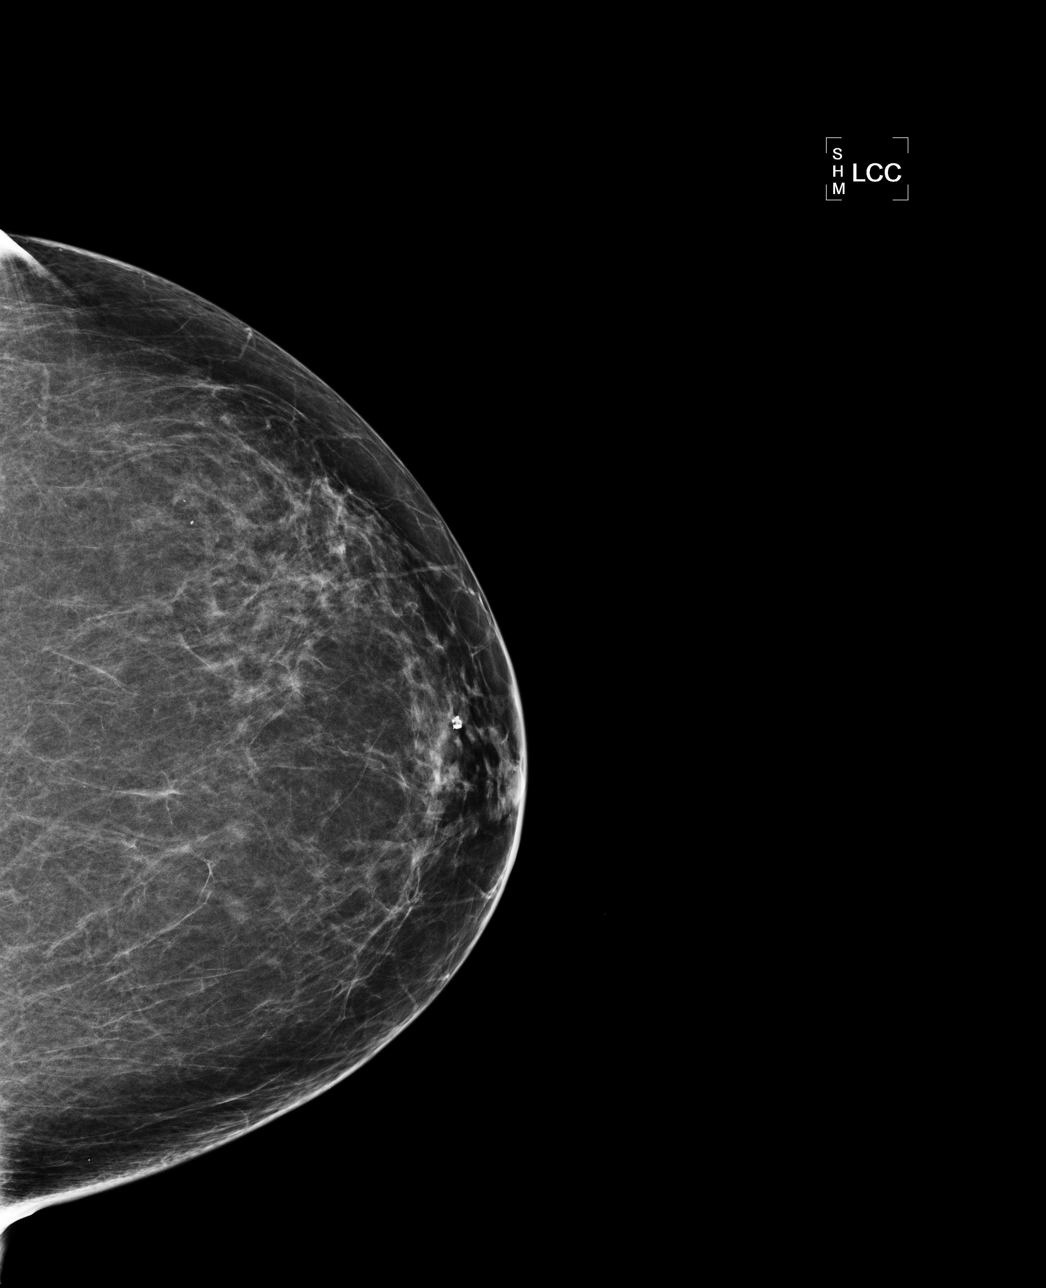

[L MLO]
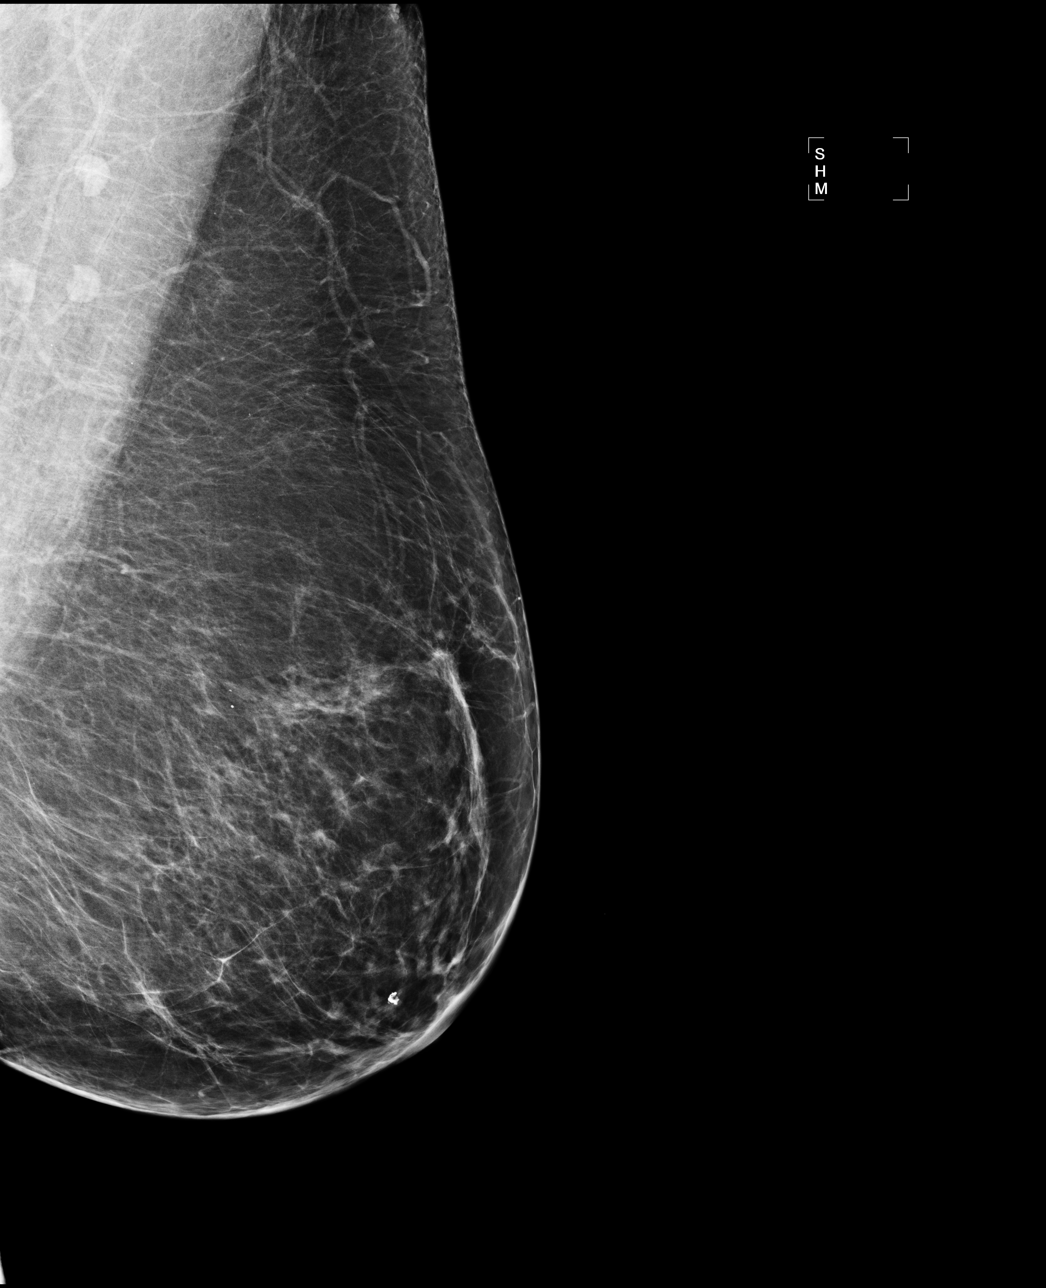

[R MLO]
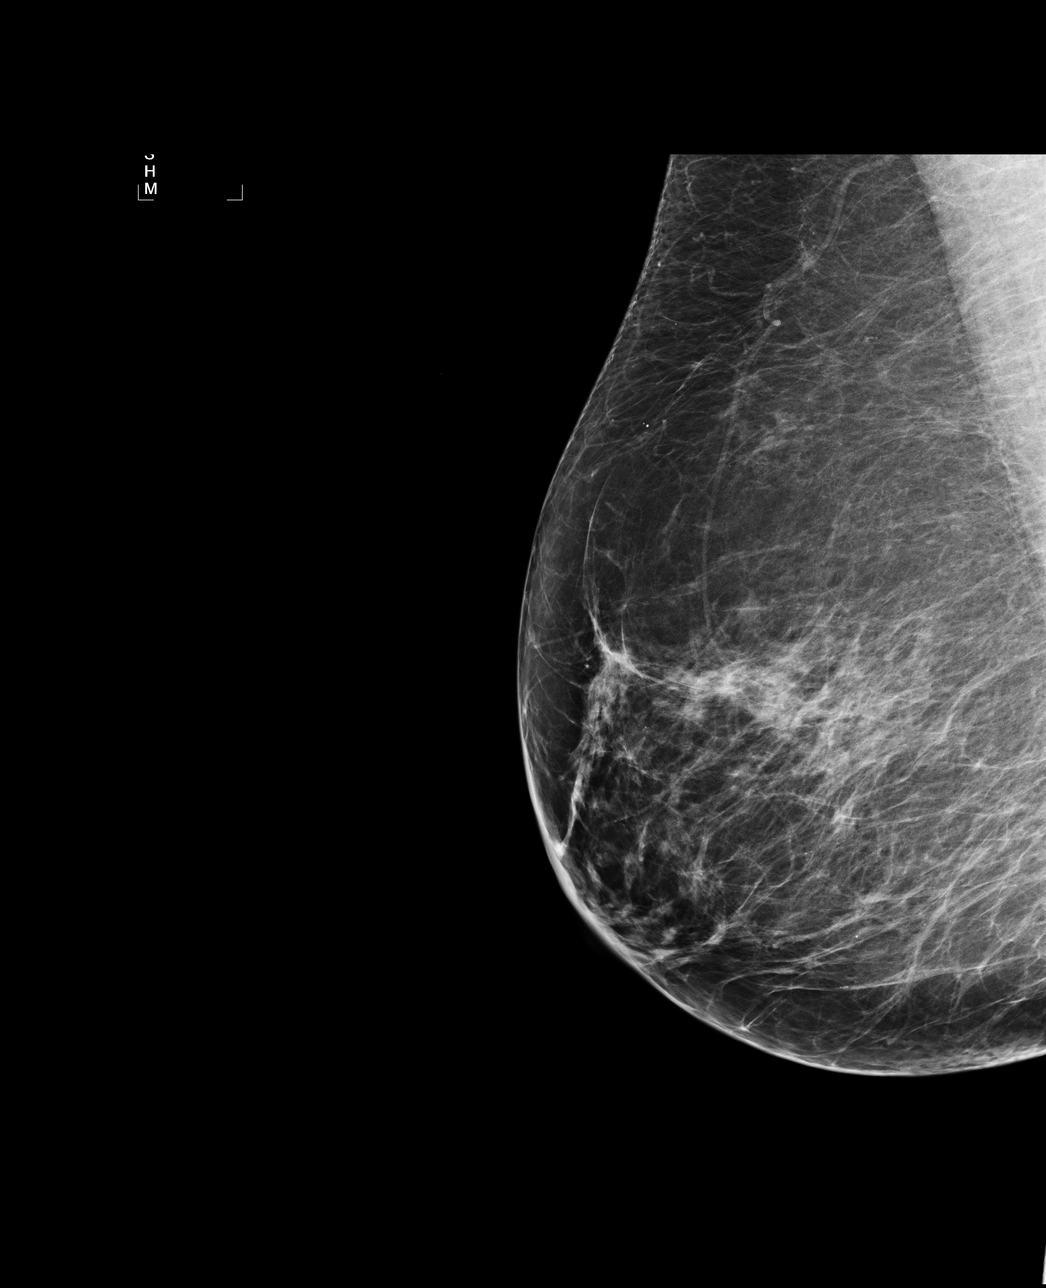

[4 of 4 positions shown; findings below may reference images not displayed]

The breast tissue is almost entirely fatty.  There is no dominant mass, architectural distortion or
calcification to suggest malignancy.

Images were processed with CAD.
IMPRESSION: No mammographic evidence of malignancy.  Suggest yearly screening mammography.

A result letter of this screening mammogram will be mailed directly to the patient.

ASSESSMENT: Negative - BI-RADS 1

Screening mammogram in 1 year.
,

## 2010-12-10 NOTE — Op Note (Signed)
NAME:  Heidi Adams, Heidi Adams                           ACCOUNT NO.:  [id number]   MEDICAL RECORD NO.:  [id number]                   PATIENT TYPE:  AMB   LOCATION:  ENDO                                 FACILITY:  [facility]   PHYSICIAN:  Bernette Redbird, M.D.                DATE OF BIRTH:  09-06-1935   DATE OF PROCEDURE:  DATE OF DISCHARGE:                                 OPERATIVE REPORT   DATE OF PROCEDURE:  April 12, 2004.   PROCEDURE PERFORMED:  Colonoscopy with biopsies.   ENDOSCOPIST:  Bernette Redbird, MD.   INDICATION:  This is a 75 year old female with a family history of colon  polyps in her father, which apparently necessitated a colon resection.  Her  paternal aunt and uncle have both had colon cancer.  She has not previously  had a colonoscopy, but I believe she had a flexible sigmoidoscopy some years  ago by me that was unrevealing.   FINDINGS:  Diminutive rectal polyps removed.   PROCEDURE:  The procedure had been discussed with the patient, and she  provided written consent.  Sedation was Fentanyl 60 mcg and Versed 6 mg IV  without arrhythmias or desaturation.  The Olympus adult video colonoscope  was advanced without significant difficulty to the cecum as identified by  visualization of the appendiceal orifice, using some external abdominal  compression and placing the patient in the supine position to facilitate  advancement.  Pull-back was then performed.  The quality of the prep was  excellent, and it was felt that all areas were well seen.   The only abnormality on this exam was the presence of 2 diminutive (2 mm)  sessile polyps in the rectum removed by cold biopsy technique.  No large  polyps, cancer, colitis, vascular malformations, or diverticular disease  were observed.  Retroflexion in the rectum was otherwise unremarkable as was  free inspection of the rectum and pullout through the anal canal.  Small,  internal hemorrhoids were  seen.  The patient has a history  of some  intermittent rectal bleeding, but no obvious source for it on this exam was  identified so it is presumed to be of hemorrhoidal origin.   The patient tolerated the procedure well, and there were no apparent  complications.   IMPRESSION:  Diminutive rectal polyps (211.4).   PLAN:  Await pathology results.      RB/MEDQ  D:  04/12/2004  T:  04/12/2004  Job:  [id number]   cc:   Gwen Pounds, M.D.  Fax: [phone]

## 2011-01-04 ENCOUNTER — Other Ambulatory Visit: Payer: Self-pay | Admitting: Internal Medicine

## 2011-01-04 DIAGNOSIS — Z1231 Encounter for screening mammogram for malignant neoplasm of breast: Secondary | ICD-10-CM

## 2011-01-27 ENCOUNTER — Encounter: Payer: Self-pay | Admitting: Family Medicine

## 2011-02-03 ENCOUNTER — Ambulatory Visit: Payer: Self-pay

## 2011-02-10 ENCOUNTER — Ambulatory Visit: Payer: Self-pay

## 2011-02-21 ENCOUNTER — Ambulatory Visit: Payer: Self-pay

## 2011-02-23 ENCOUNTER — Ambulatory Visit
Admission: RE | Admit: 2011-02-23 | Discharge: 2011-02-23 | Disposition: A | Discharge status: Home / Self Care | Payer: Medicare Other | Source: Ambulatory Visit | Attending: Internal Medicine | Admitting: Internal Medicine

## 2011-02-23 DIAGNOSIS — Z1231 Encounter for screening mammogram for malignant neoplasm of breast: Secondary | ICD-10-CM

## 2011-05-12 ENCOUNTER — Telehealth: Payer: Self-pay | Admitting: Internal Medicine

## 2011-05-12 NOTE — Telephone Encounter (Signed)
Called and spoke with pt and she stated that all she needed was to find out how much stress doing a PFt does to someone.  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  i explained to her that these men should have an ok from their doctors to have these tests done.  i explained to her that i could not say it was ok for her to give these tests to these people or not.  She was just concerned about doing these test.

## 2011-07-21 ENCOUNTER — Ambulatory Visit: Payer: Medicare Other | Admitting: Gynecology

## 2011-08-05 ENCOUNTER — Encounter: Payer: Self-pay | Admitting: Gynecology

## 2011-08-05 ENCOUNTER — Ambulatory Visit (INDEPENDENT_AMBULATORY_CARE_PROVIDER_SITE_OTHER): Payer: Medicare Other | Admitting: Gynecology

## 2011-08-05 VITALS — BP 140/70 | Ht 64.0 in | Wt 170.0 lb

## 2011-08-05 DIAGNOSIS — A499 Bacterial infection, unspecified: Secondary | ICD-10-CM

## 2011-08-05 DIAGNOSIS — N760 Acute vaginitis: Secondary | ICD-10-CM

## 2011-08-05 DIAGNOSIS — B373 Candidiasis of vulva and vagina: Secondary | ICD-10-CM

## 2011-08-05 DIAGNOSIS — L293 Anogenital pruritus, unspecified: Secondary | ICD-10-CM

## 2011-08-05 DIAGNOSIS — N898 Other specified noninflammatory disorders of vagina: Secondary | ICD-10-CM

## 2011-08-05 DIAGNOSIS — N952 Postmenopausal atrophic vaginitis: Secondary | ICD-10-CM

## 2011-08-05 DIAGNOSIS — B9689 Other specified bacterial agents as the cause of diseases classified elsewhere: Secondary | ICD-10-CM

## 2011-08-05 MED ORDER — METRONIDAZOLE 500 MG PO TABS: 500.0000 mg | ORAL_TABLET | Freq: Two times a day (BID) | ORAL | Status: AC

## 2011-08-05 MED ORDER — TERCONAZOLE 0.8 % VA CREA: 1.0000 | TOPICAL_CREAM | Freq: Every day | VAGINAL | Status: AC

## 2011-08-05 MED ORDER — FLUCONAZOLE 200 MG PO TABS: 200.0000 mg | ORAL_TABLET | Freq: Every day | ORAL | Status: AC

## 2011-08-05 NOTE — Progress Notes (Signed)
Addended by: Richardson Chiquito on: 08/05/2011 02:58 PM   Modules accepted: Orders

## 2011-08-05 NOTE — Progress Notes (Signed)
Heidi Adams 1936-05-13 [id number]        76 y.o.  New patient complaining of vaginal irritation.  Has been going on for over a month. No history of this previously.  Past medical history,surgical history, medications, allergies, family history and social history were all reviewed and documented in the EPIC chart. ROS:  Was performed and pertinent positives and negatives are included in the history.  Exam: Sherri chaperone present Filed Vitals:   08/05/11 1205  BP: 140/70   General appearance  Normal Skin grossly normal Head/Neck normal with no cervical or supraclavicular adenopathy thyroid normal Lungs  clear Cardiac RR, without RMG Abdominal  soft, nontender, without masses, organomegaly or hernia Breasts  examined lying and sitting without masses, retractions, discharge or axillary adenopathy. Pelvic  Ext/BUS/vagina with atrophic changes and a generalized vulvitis with some linear irritative cracking along the upper right labia minora.  Adnexa  Without masses or tenderness    Anus and perineum  normal   Rectovaginal  normal sphincter tone without palpated masses or tenderness.    Assessment/Plan:  76 y.o. female the following issues. 1. Vulvovaginitis. Wet prep is positive for BV. Given the intense irritation with cracking of her to cover her for yeast also. We'll treat with Flagyl 500 twice a day x7 days alcohol avoidance reviewed. Terazol three-day externally and Diflucan 200 daily x3 days. The patient did not use the Terazol internally. Follow up if symptoms persist or recur 2. Pap smear. No Pap smear was done today. I reviewed current guidelines. She has no history of abnormal Pap smears in the past and has been getting them routinely. She is status post hysterectomy over the age of 9. Patient's comfortable and doing no further Pap smears. 3. Breast health. SBE monthly reviewed. Had mammography June 2012 will continue with annual mammography. 4. Bone health. She follows up with  Dr. Chiyeko Ferre Lasso who does her bone densities she'll continue to see him for this. 5. Colonoscopy she is up-to-date with her colonoscopy is. 6. Health maintenance. She sees Dr. Lind Ausley Lasso for routine healthcare and no blood work was done today.    Dara Lords MD, 1:03 PM 08/05/2011

## 2011-08-05 NOTE — Patient Instructions (Signed)
Use antibiotics as prescribed. If symptoms persist call the office. Otherwise follow up in one year.

## 2011-12-09 ENCOUNTER — Ambulatory Visit (INDEPENDENT_AMBULATORY_CARE_PROVIDER_SITE_OTHER): Payer: Medicare Other | Admitting: Gynecology

## 2011-12-09 ENCOUNTER — Encounter: Payer: Self-pay | Admitting: Gynecology

## 2011-12-09 DIAGNOSIS — N898 Other specified noninflammatory disorders of vagina: Secondary | ICD-10-CM

## 2011-12-09 DIAGNOSIS — N762 Acute vulvitis: Secondary | ICD-10-CM

## 2011-12-09 DIAGNOSIS — N9089 Other specified noninflammatory disorders of vulva and perineum: Secondary | ICD-10-CM

## 2011-12-09 DIAGNOSIS — B9689 Other specified bacterial agents as the cause of diseases classified elsewhere: Secondary | ICD-10-CM

## 2011-12-09 DIAGNOSIS — A499 Bacterial infection, unspecified: Secondary | ICD-10-CM

## 2011-12-09 DIAGNOSIS — N760 Acute vaginitis: Secondary | ICD-10-CM

## 2011-12-09 DIAGNOSIS — L293 Anogenital pruritus, unspecified: Secondary | ICD-10-CM

## 2011-12-09 LAB — WET PREP FOR TRICH, YEAST, CLUE: Trich, Wet Prep: NONE SEEN

## 2011-12-09 MED ORDER — FLUCONAZOLE 200 MG PO TABS: 200.0000 mg | ORAL_TABLET | Freq: Every day | ORAL | Status: AC

## 2011-12-09 MED ORDER — METRONIDAZOLE 500 MG PO TABS: 500.0000 mg | ORAL_TABLET | Freq: Two times a day (BID) | ORAL | Status: AC

## 2011-12-09 NOTE — Progress Notes (Signed)
Patient presents complaining of vulvar irritation of the last several weeks. She has a sore area on her upper left labia it feels nodular to her to comes when she has this irritation. She was treated in January for a bacterial vulvitis with Flagyl 500 twice a day x7 days and Diflucan 200 daily for 3 days and the symptoms totally resolved but now have recurred. She has no precipitating events with no recent treatment with antibiotics or other vaginal treatments. She also noted a little bit of bleeding from this irritated area.  Exam was can chaperone present Abdomen soft nontender without masses guarding rebound organomegaly. Pelvic external with mild generalized vulvitis involving labia minora and peri-rectal area. Upper left labia minora with superficial excoriated area. No specific lesion but irritated.  Left labia is larger than the right labia.  BUS vagina with white discharge. Bimanual without palpable masses or tenderness.  Assessment and plan: Exam and wet prep consistent with BV. We'll treat with Flagyl 500 twice a day x7 days. Alcohol avoidance reviewed. Will cover with Diflucan 200 for 3 days. Did not see yeast on the wet prep but again with the inflammatory perianal changes I want to cover her for this. I've asked her to return in 2 weeks after treatment so I can reinspect her vulva, particularly the left upper labia and if any residual changes I may biopsy this area.  Her left labia is larger than her right labia which she states has always been that way.  If totally resolves we will follow expectantly.

## 2011-12-09 NOTE — Patient Instructions (Signed)
Take Flagyl (metronidazole) twice daily for 7 days. Avoid alcohol. Take Diflucan (fluconazole) daily for 3 days. Follow up with me in 2 weeks for exam.

## 2011-12-30 ENCOUNTER — Ambulatory Visit: Payer: Medicare Other | Admitting: Gynecology

## 2012-02-23 ENCOUNTER — Other Ambulatory Visit: Payer: Self-pay | Admitting: Internal Medicine

## 2012-02-23 DIAGNOSIS — Z1231 Encounter for screening mammogram for malignant neoplasm of breast: Secondary | ICD-10-CM

## 2012-03-07 ENCOUNTER — Ambulatory Visit: Payer: Medicare Other

## 2012-03-20 ENCOUNTER — Ambulatory Visit
Admission: RE | Admit: 2012-03-20 | Discharge: 2012-03-20 | Disposition: A | Discharge status: Home / Self Care | Payer: Medicare Other | Source: Ambulatory Visit | Attending: Internal Medicine | Admitting: Internal Medicine

## 2012-03-20 DIAGNOSIS — Z1231 Encounter for screening mammogram for malignant neoplasm of breast: Secondary | ICD-10-CM

## 2012-03-20 IMAGING — MG MM DIGITAL SCREENING BILAT
5 series · 5 of 5 positions shown · non-contrast
Comparison: Previous exams.

CLINICAL DATA: Screening.

DIGITAL BILATERAL SCREENING MAMMOGRAM WITH CAD

[R CC]
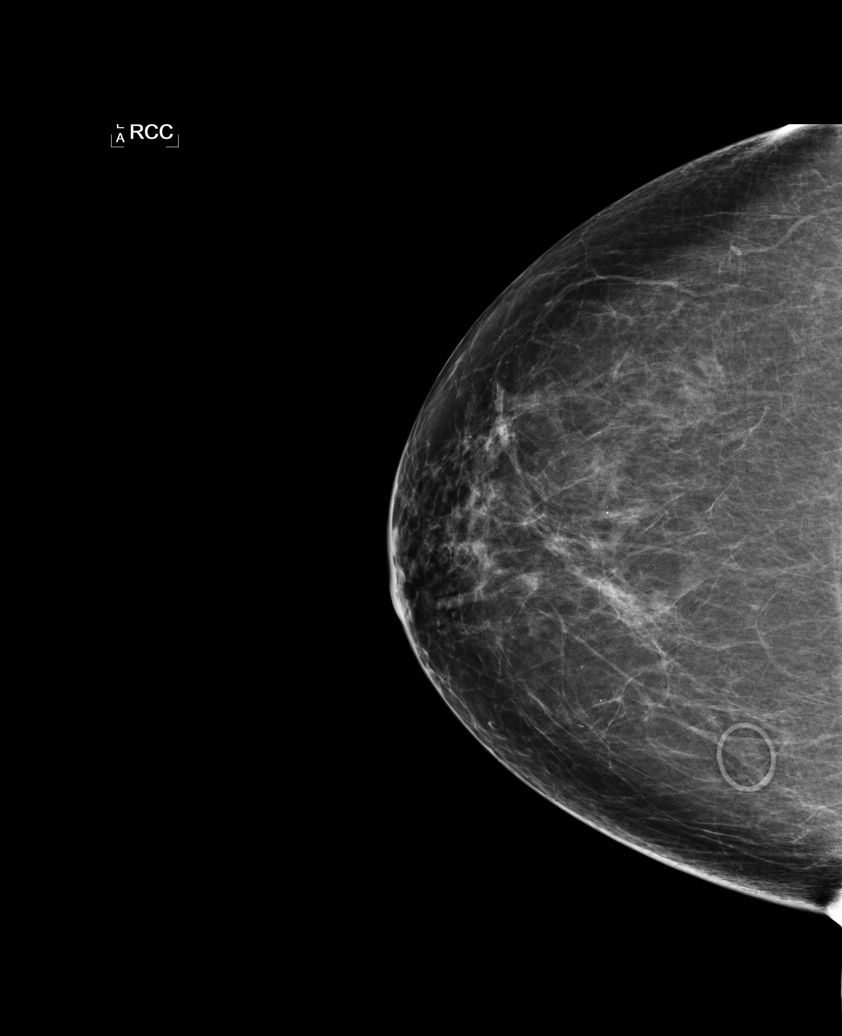

[L CC]
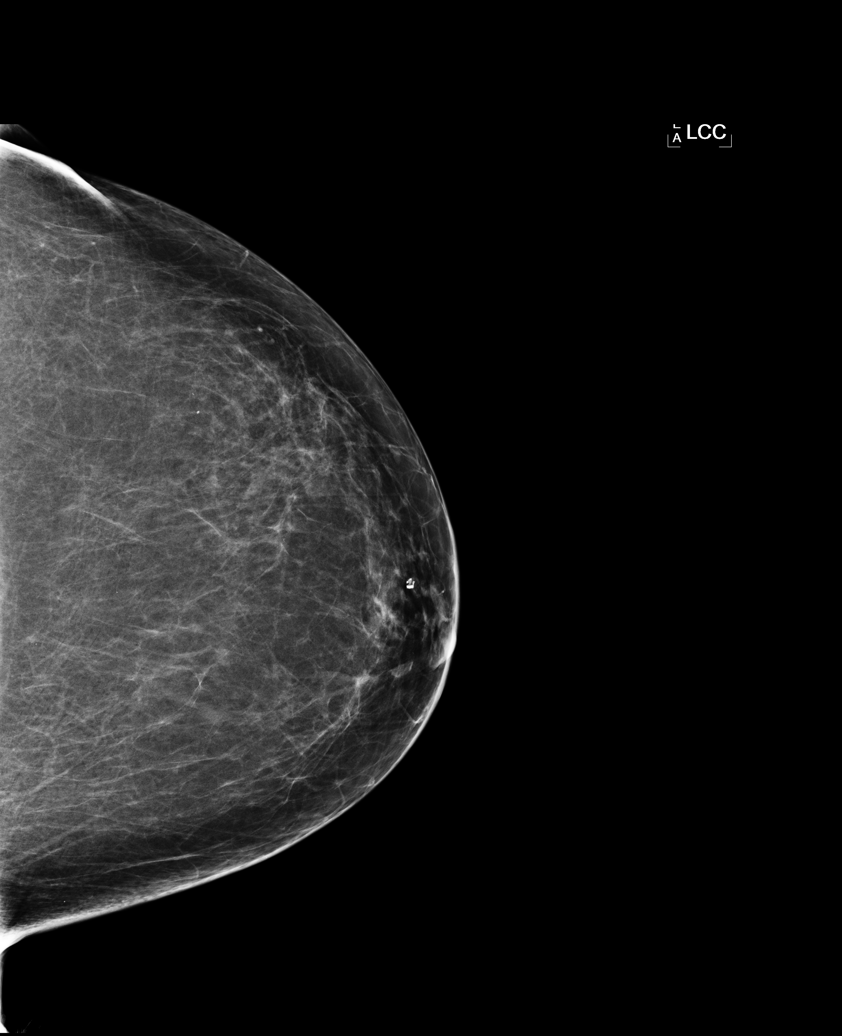

[L MLO]
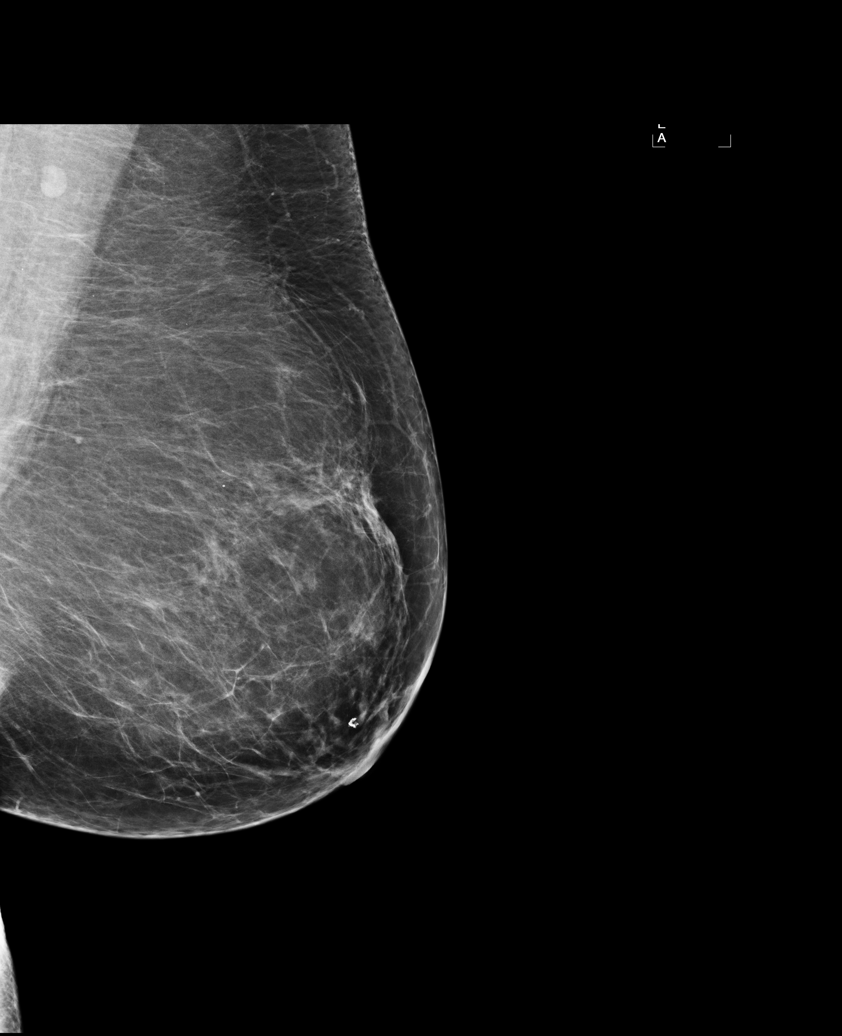

[R MLO (1 of 2)]
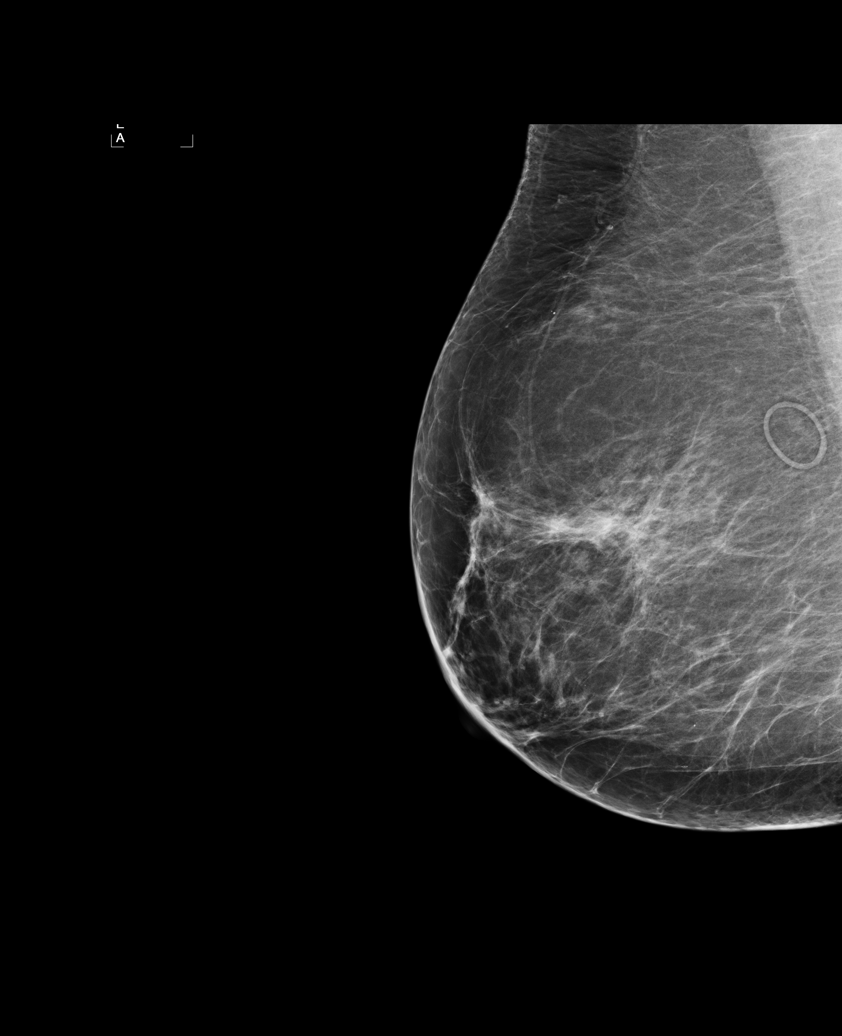

[R MLO (2 of 2)]
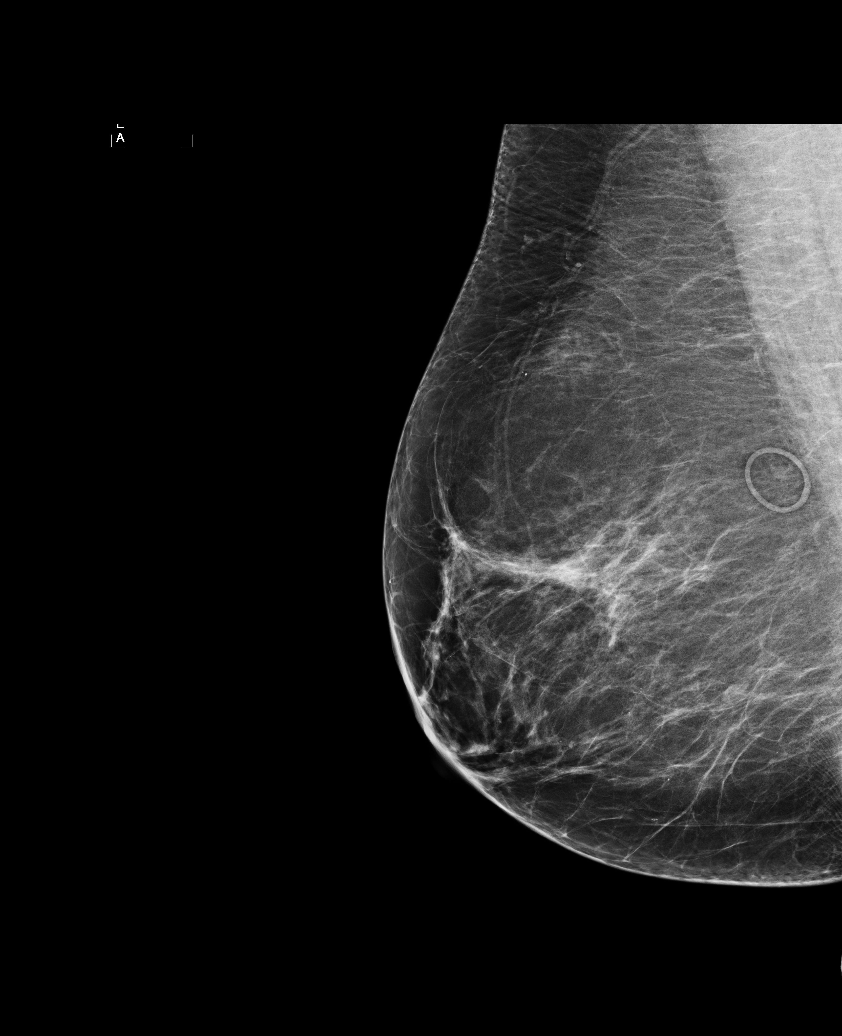

[5 of 5 positions shown; findings below may reference images not displayed]

FINDINGS: There are scattered fibroglandular densities. No
suspicious masses, architectural distortion, or calcifications are
present.

Images were processed with CAD.
IMPRESSION: No mammographic evidence of malignancy.

A result letter of this screening mammogram will be mailed directly
to the patient.

RECOMMENDATION:
Screening mammogram in one year. (Code:FV-[id number])

BI-RADS CATEGORY 1:  Negative.

## 2012-09-13 ENCOUNTER — Other Ambulatory Visit: Payer: Self-pay

## 2013-02-28 ENCOUNTER — Other Ambulatory Visit: Payer: Self-pay

## 2013-02-28 DIAGNOSIS — Z1231 Encounter for screening mammogram for malignant neoplasm of breast: Secondary | ICD-10-CM

## 2013-04-01 ENCOUNTER — Ambulatory Visit
Admission: RE | Admit: 2013-04-01 | Discharge: 2013-04-01 | Disposition: A | Discharge status: Home / Self Care | Payer: Medicare Other | Source: Ambulatory Visit

## 2013-04-01 DIAGNOSIS — Z1231 Encounter for screening mammogram for malignant neoplasm of breast: Secondary | ICD-10-CM

## 2014-03-07 DIAGNOSIS — L900 Lichen sclerosus et atrophicus: Secondary | ICD-10-CM | POA: Insufficient documentation

## 2014-04-22 ENCOUNTER — Other Ambulatory Visit: Payer: Self-pay

## 2014-04-22 DIAGNOSIS — Z1231 Encounter for screening mammogram for malignant neoplasm of breast: Secondary | ICD-10-CM

## 2014-05-06 ENCOUNTER — Ambulatory Visit
Admission: RE | Admit: 2014-05-06 | Discharge: 2014-05-06 | Disposition: A | Discharge status: Home / Self Care | Payer: Medicare Other | Source: Ambulatory Visit

## 2014-05-06 DIAGNOSIS — Z1231 Encounter for screening mammogram for malignant neoplasm of breast: Secondary | ICD-10-CM

## 2014-05-26 ENCOUNTER — Encounter: Payer: Self-pay | Admitting: Gynecology

## 2015-05-14 ENCOUNTER — Other Ambulatory Visit (HOSPITAL_COMMUNITY)
Admission: RE | Admit: 2015-05-14 | Discharge: 2015-05-14 | Disposition: A | Discharge status: Home / Self Care | Payer: PPO | Source: Ambulatory Visit | Attending: Gynecology | Admitting: Gynecology

## 2015-05-14 ENCOUNTER — Ambulatory Visit (INDEPENDENT_AMBULATORY_CARE_PROVIDER_SITE_OTHER): Payer: PPO | Admitting: Gynecology

## 2015-05-14 ENCOUNTER — Encounter: Payer: Self-pay | Admitting: Gynecology

## 2015-05-14 VITALS — BP 130/74 | Ht 62.0 in | Wt 183.0 lb

## 2015-05-14 DIAGNOSIS — Z1272 Encounter for screening for malignant neoplasm of vagina: Secondary | ICD-10-CM

## 2015-05-14 DIAGNOSIS — Z124 Encounter for screening for malignant neoplasm of cervix: Secondary | ICD-10-CM | POA: Insufficient documentation

## 2015-05-14 DIAGNOSIS — Z01419 Encounter for gynecological examination (general) (routine) without abnormal findings: Secondary | ICD-10-CM | POA: Diagnosis not present

## 2015-05-14 DIAGNOSIS — N760 Acute vaginitis: Secondary | ICD-10-CM | POA: Diagnosis not present

## 2015-05-14 DIAGNOSIS — N952 Postmenopausal atrophic vaginitis: Secondary | ICD-10-CM | POA: Diagnosis not present

## 2015-05-14 LAB — WET PREP FOR TRICH, YEAST, CLUE
Clue Cells Wet Prep HPF POC: NONE SEEN
Trich, Wet Prep: NONE SEEN
Yeast Wet Prep HPF POC: NONE SEEN

## 2015-05-14 MED ORDER — NYSTATIN-TRIAMCINOLONE 100000-0.1 UNIT/GM-% EX OINT: 1.0000 "application " | TOPICAL_OINTMENT | Freq: Two times a day (BID) | CUTANEOUS | Status: DC

## 2015-05-14 MED ORDER — FLUCONAZOLE 200 MG PO TABS: 200.0000 mg | ORAL_TABLET | Freq: Every day | ORAL | Status: DC

## 2015-05-14 NOTE — Patient Instructions (Signed)
Take the Diflucan pills daily for 5 days Apply the prescribed cream twice daily to the rash area. Follow up if your symptoms persist, worsen or recur.

## 2015-05-14 NOTE — Addendum Note (Signed)
Addended by: Joaquin Music on: 05/14/2015 03:11 PM   Modules accepted: Orders

## 2015-05-14 NOTE — Addendum Note (Signed)
Addended by: Nelva Nay on: 05/14/2015 03:12 PM   Modules accepted: Orders

## 2015-05-14 NOTE — Progress Notes (Signed)
Heidi Adams 1935/07/28 [id number]        79 y.o.  G2P2 for breast and pelvic exam. Several issues noted below.  Past medical history,surgical history, problem list, medications, allergies, family history and social history were all reviewed and documented as reviewed in the EPIC chart.  ROS:  Performed with pertinent positives and negatives included in the history, assessment and plan.   Additional significant findings :  none   Exam: Kim Counsellor Vitals:   05/14/15 1421  BP: 130/74  Height: 5\' 2"  (1.575 m)  Weight: 183 lb (83.008 kg)   General appearance:  Normal affect, orientation and appearance. Skin: Grossly normal HEENT: Without gross lesions.  No cervical or supraclavicular adenopathy. Thyroid normal.  Lungs:  Clear without wheezing, rales or rhonchi Cardiac: RR, without RMG Abdominal:  Soft, nontender, without masses, guarding, rebound, organomegaly or hernia Breasts:  Examined lying and sitting without masses, retractions, discharge or axillary adenopathy. Pelvic:  Ext/BUS/vagina with generalized atrophic changes. Diffuse rash at the groin creases laterally. Consistent with fungal. White discharge noted. Pap smear of the cuff done  Adnexa  Without masses or tenderness    Anus and perineum  Normal   Rectovaginal  Normal sphincter tone without palpated masses or tenderness.    Assessment/Plan:  79 y.o. G2P2 female for breast and pelvic exam.  1. Postmenopausal/atrophic genital changes. Status post TAH/BSO in the past.  Without significant hot flushes, night sweats, vaginal dryness. 2. Vulvovaginitis. Been present for months. Consistent with fungal historically as it gets worse during the warm weather and sweating. Also has episodes of rash underneath both breasts although none now. Wet prep was negative but I'm going to cover her with Diflucan 200 mg daily 5 days and Mytrex equivalent cream twice a day as needed. Follow up if symptoms persist, worsen or  recur. 3. Mammography scheduled and she'll follow up for this. SBE monthly reviewed. 4. Pap smear of vaginal cuff today. No history of abnormal Pap smears previously. Status post hysterectomy in the past. I did one today as we do not have copies of all of her prior Pap smears she is a recently new patient. 5. DEXA reportedly done through Dr. Keane Police office over the past 1-2 years. She'll follow up with him in reference to bone health follow up. 6. Colonoscopy 2011. Repeat at their recommended interval. 7. Health maintenance. No routine blood work done as patient reports is done at Dr. Keane Police office. Follow up in one year, sooner if rash continues.   Anastasio Auerbach MD, 2:52 PM 05/14/2015

## 2015-05-18 LAB — CYTOLOGY - PAP

## 2015-06-04 ENCOUNTER — Other Ambulatory Visit: Payer: Self-pay

## 2015-06-04 MED ORDER — FLUCONAZOLE 200 MG PO TABS: 200.0000 mg | ORAL_TABLET | Freq: Every day | ORAL | Status: DC

## 2015-08-14 DIAGNOSIS — M25562 Pain in left knee: Secondary | ICD-10-CM | POA: Diagnosis not present

## 2015-08-27 ENCOUNTER — Other Ambulatory Visit (HOSPITAL_BASED_OUTPATIENT_CLINIC_OR_DEPARTMENT_OTHER): Payer: Self-pay | Admitting: Internal Medicine

## 2015-08-27 DIAGNOSIS — Z1231 Encounter for screening mammogram for malignant neoplasm of breast: Secondary | ICD-10-CM

## 2015-08-31 ENCOUNTER — Ambulatory Visit (HOSPITAL_BASED_OUTPATIENT_CLINIC_OR_DEPARTMENT_OTHER)
Admission: RE | Admit: 2015-08-31 | Discharge: 2015-08-31 | Disposition: A | Discharge status: Home / Self Care | Payer: PPO | Source: Ambulatory Visit | Attending: Internal Medicine | Admitting: Internal Medicine

## 2015-08-31 DIAGNOSIS — Z1231 Encounter for screening mammogram for malignant neoplasm of breast: Secondary | ICD-10-CM

## 2015-08-31 IMAGING — MG MM DIGITAL SCREENING BILAT W/ TOMO W/ CAD
10 of 13 series · 10 of 29 positions shown · non-contrast
Comparison: Previous exam(s).

CLINICAL DATA: Screening.

EXAM:
DIGITAL SCREENING BILATERAL MAMMOGRAM WITH 3D TOMO WITH CAD

[R CV]
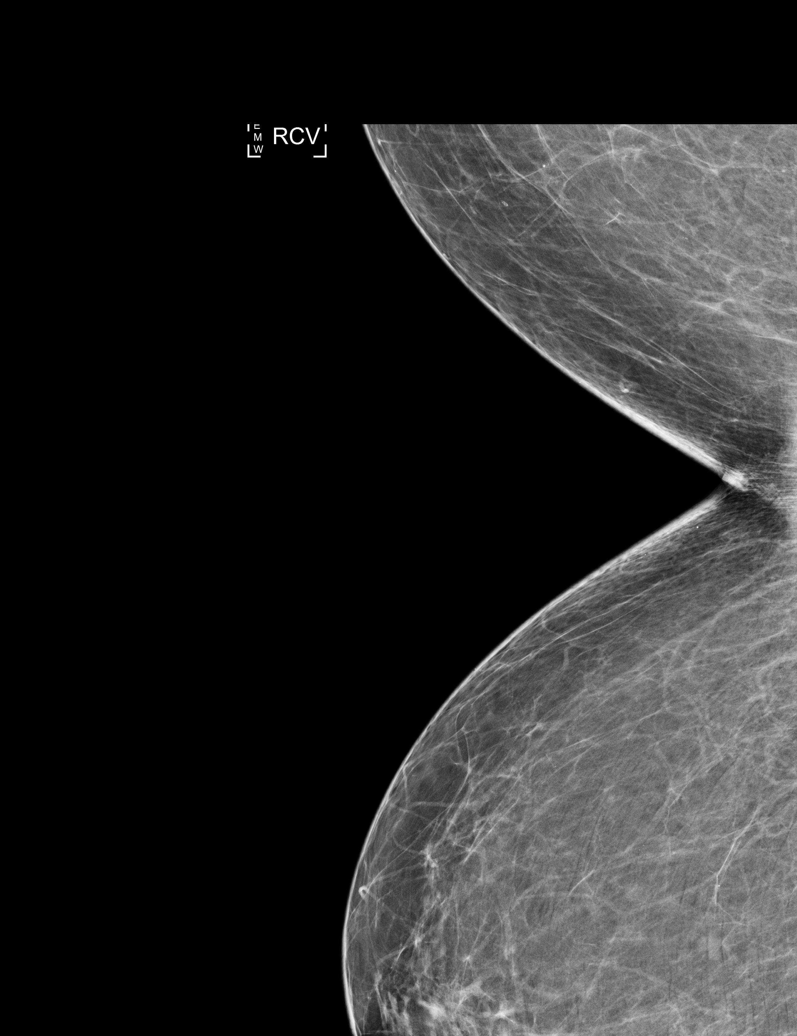

[R CC]
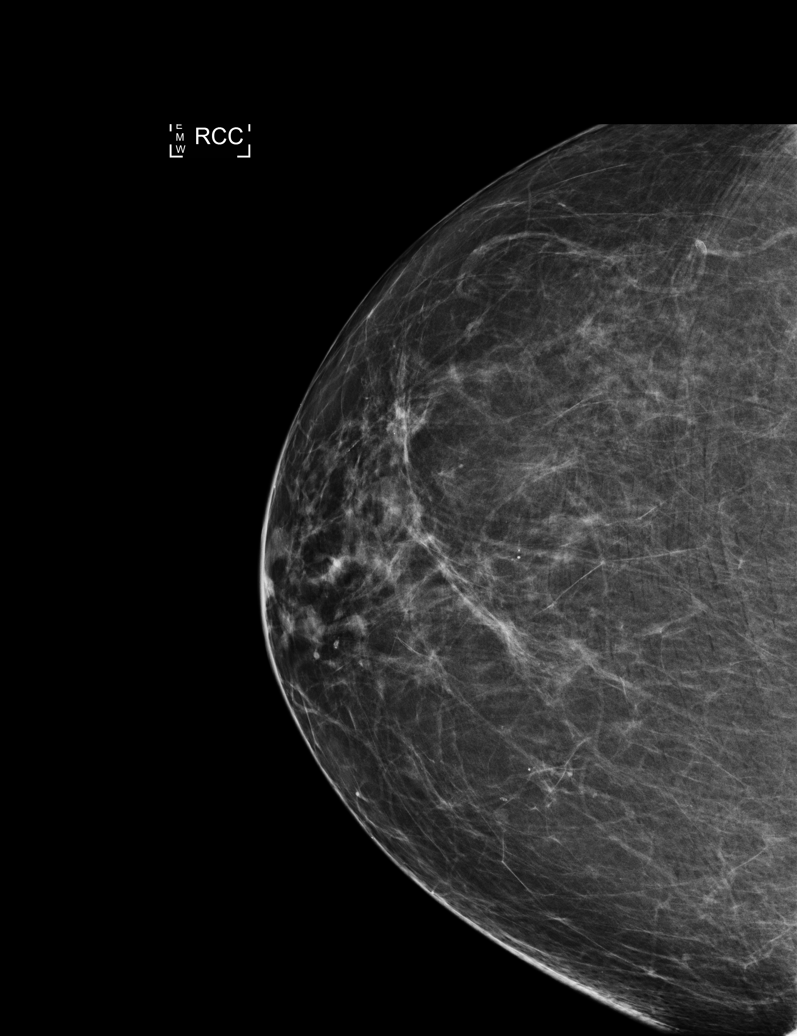

[L CC]
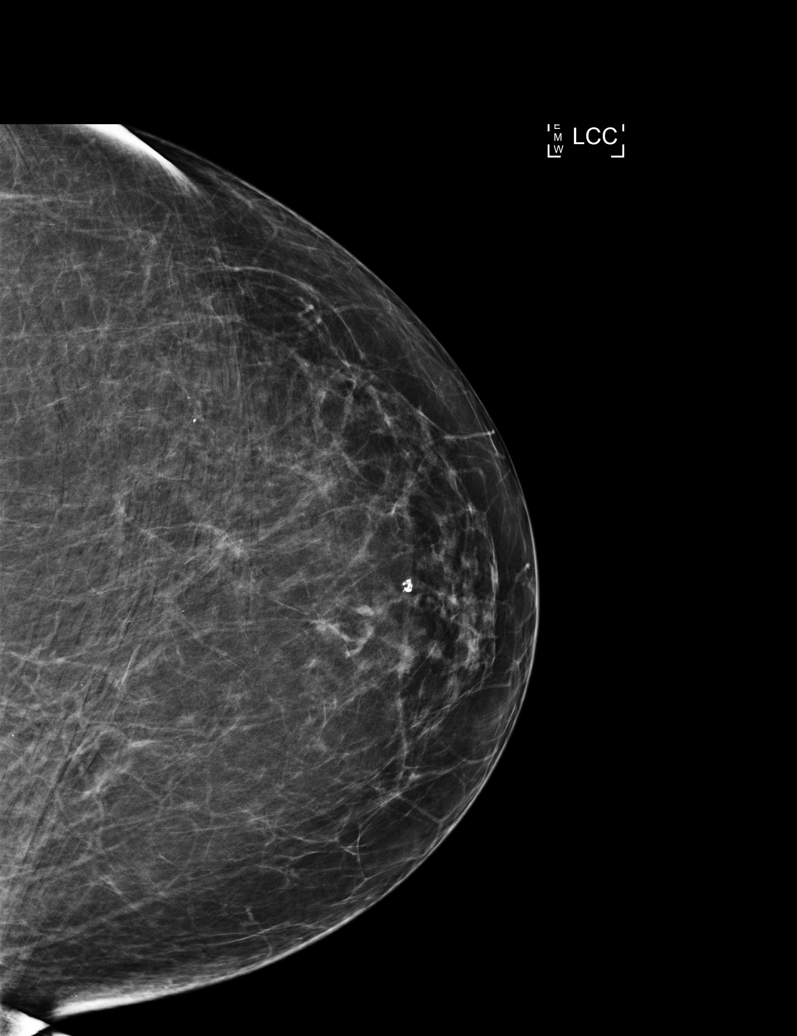

[R MLO]
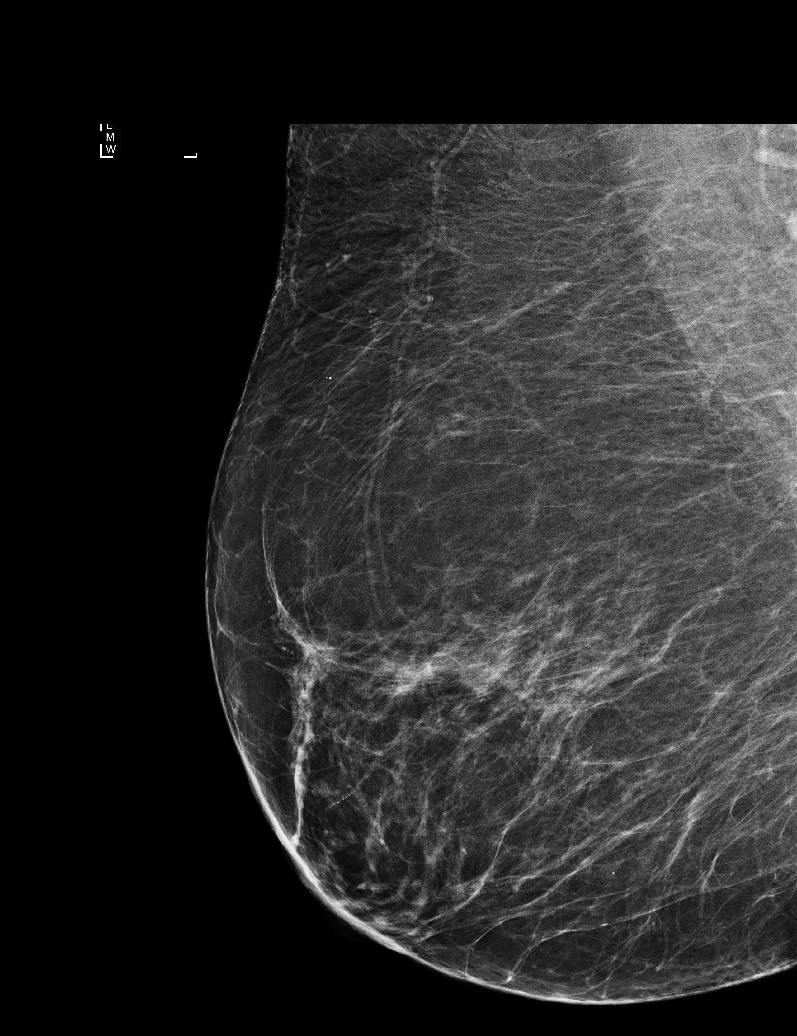

[L MLO]
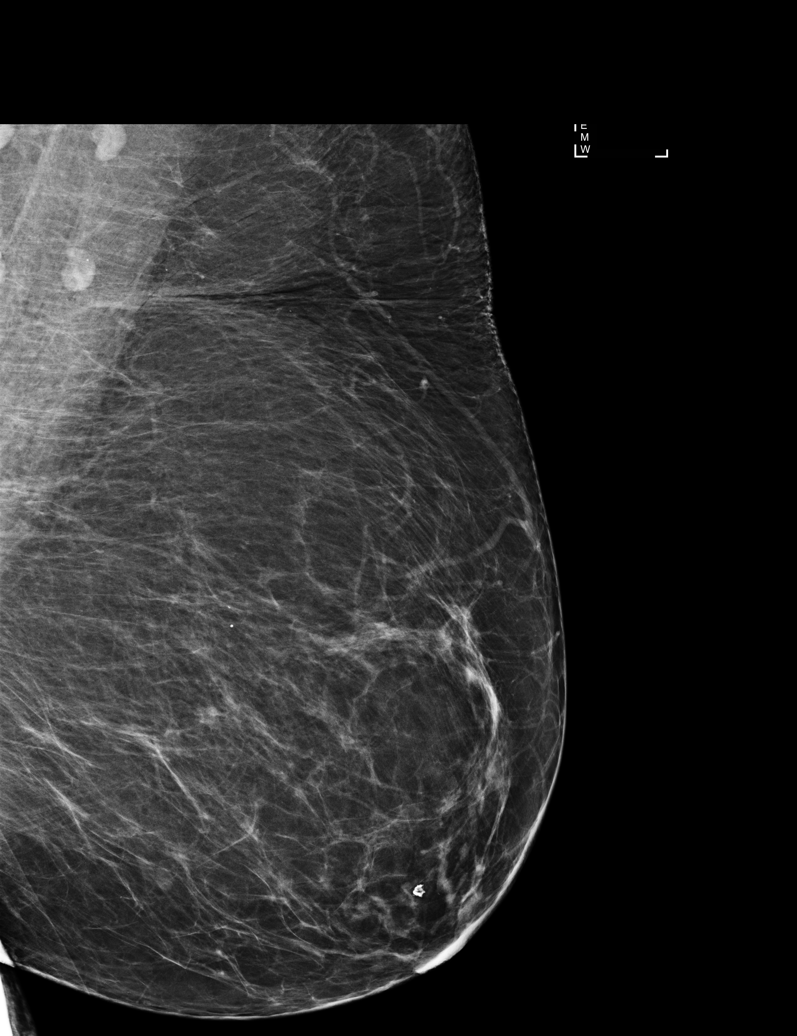

[L CC tomo]
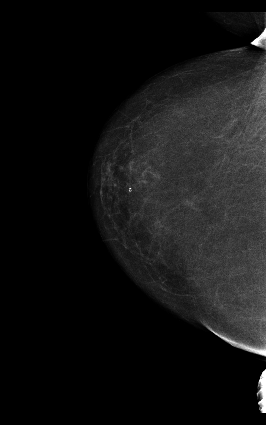

[R CC tomo]
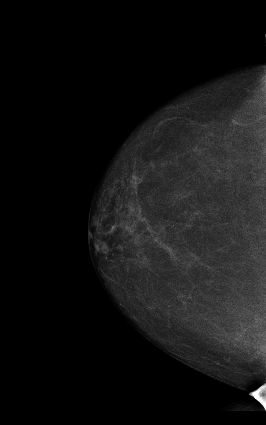

[R MLO tomo]
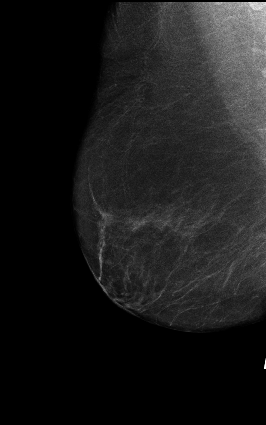

[L MLO tomo (1 of 2)]
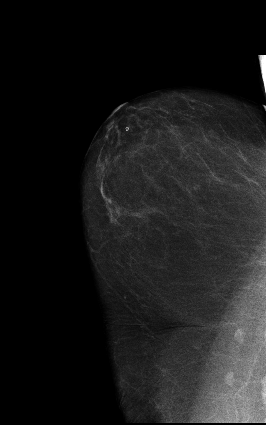

[L MLO tomo (2 of 2) · tomo slice 41/82.0]
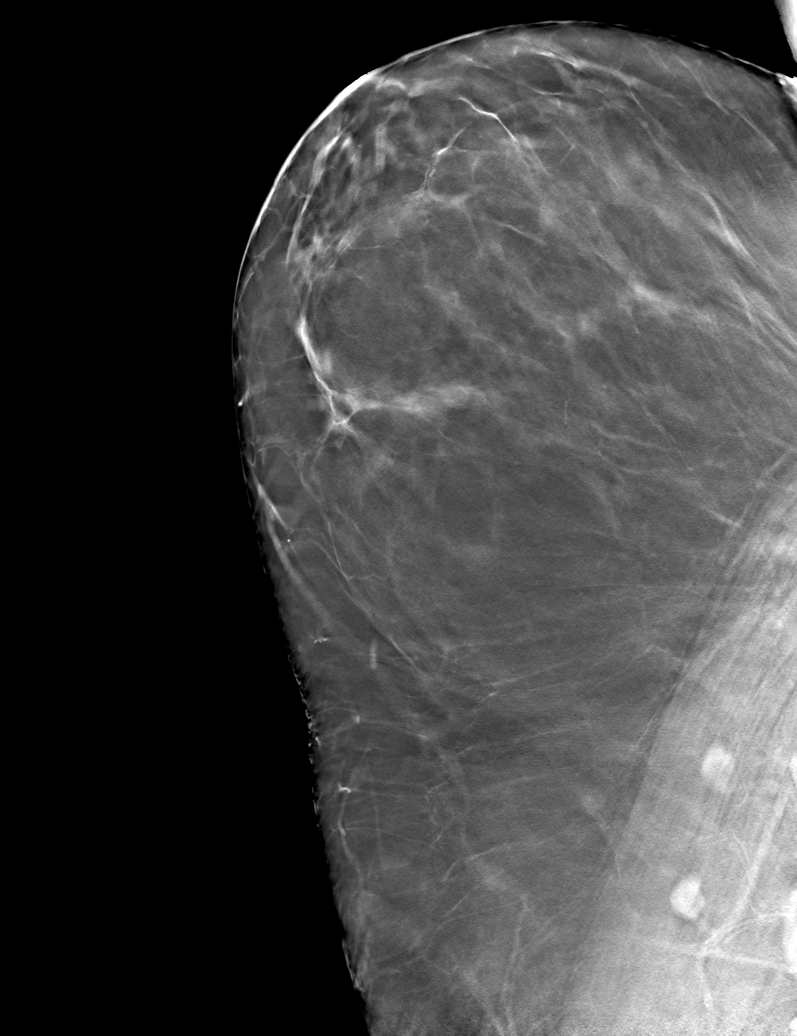

[10 of 29 positions shown; findings below may reference images not displayed]

ACR Breast Density Category b: There are scattered areas of
fibroglandular density.
FINDINGS: There are no findings suspicious for malignancy. Images were
processed with CAD.
IMPRESSION: No mammographic evidence of malignancy. A result letter of this
screening mammogram will be mailed directly to the patient.

RECOMMENDATION:
Screening mammogram in one year. (Code:55-L-23V)

BI-RADS CATEGORY  1: Negative.

## 2015-09-16 DIAGNOSIS — M25552 Pain in left hip: Secondary | ICD-10-CM | POA: Diagnosis not present

## 2015-09-16 DIAGNOSIS — M25551 Pain in right hip: Secondary | ICD-10-CM | POA: Diagnosis not present

## 2015-09-17 NOTE — H&P (Signed)
TOTAL KNEE ADMISSION H&P  Patient is being admitted for left total knee arthroplasty.  Subjective:  Chief Complaint:    Left knee primary OA / pain  HPI: Heidi Adams, 80 y.o. female, has a history of pain and functional disability in the left knee due to arthritis and has failed non-surgical conservative treatments for greater than 12 weeks to includeNSAID's and/or analgesics, corticosteriod injections, viscosupplementation injections and activity modification.  Onset of symptoms was gradual, starting >10 years ago with gradually worsening course since that time. The patient noted prior procedures on the knee to include  arthroscopy on the left knee(s).  Patient currently rates pain in the left knee(s) at 7 out of 10 with activity. Patient has worsening of pain with activity and weight bearing, pain that interferes with activities of daily living, pain with passive range of motion, crepitus and joint swelling.  Patient has evidence of periarticular osteophytes and joint space narrowing by imaging studies.  There is no active infection.   Risks, benefits and expectations were discussed with the patient.  Risks including but not limited to the risk of anesthesia, blood clots, nerve damage, blood vessel damage, failure of the prosthesis, infection and up to and including death.  Patient understand the risks, benefits and expectations and wishes to proceed with surgery.   PCP: Precious Reel, MD  D/C Plans:      Home with HHPT  Post-op Meds:       No Rx given  Tranexamic Acid:      To be given - IV   Decadron:      Is to be given  FYI:     Xarelto (unable to tolerate ASA)  Oxycodone (patient states she is ok to use)     Past Medical History  Diagnosis Date  . Thyroiditis 1973  . Hypertension     Past Surgical History  Procedure Laterality Date  . Thyroidectomy  1973  . Knee arthroscopy      left  . Elbow surgery    . Abdominal hysterectomy  89    TAH BSO  . Oophorectomy      BSO    No prescriptions prior to admission   Allergies  Allergen Reactions  . Epinephrine     Tachcardyia, chest pains tremors  . Tenormin [Atenolol] Rash    Social History  Substance Use Topics  . Smoking status: Former Smoker    Quit date: 07/26/1987  . Smokeless tobacco: Never Used  . Alcohol Use: No    Family History  Problem Relation Age of Onset  . Diabetes Father   . Hypertension Father      Review of Systems  Constitutional: Negative.   HENT: Negative.   Eyes: Negative.   Respiratory: Negative.   Cardiovascular: Negative.   Gastrointestinal: Negative.   Genitourinary: Negative.   Musculoskeletal: Positive for joint pain.  Skin: Negative.   Neurological: Negative.   Endo/Heme/Allergies: Negative.   Psychiatric/Behavioral: Negative.     Objective:  Physical Exam  Constitutional: She is oriented to person, place, and time. She appears well-developed.  HENT:  Head: Normocephalic.  Eyes: Pupils are equal, round, and reactive to light.  Neck: Neck supple. No JVD present. No tracheal deviation present. No thyromegaly present.  Cardiovascular: Normal rate, regular rhythm, normal heart sounds and intact distal pulses.   Respiratory: Effort normal and breath sounds normal. No stridor. No respiratory distress. She has no wheezes.  GI: Soft. There is no tenderness. There is no guarding.  Musculoskeletal:  Left knee: She exhibits decreased range of motion, swelling and bony tenderness. She exhibits no ecchymosis, no deformity, no laceration and no erythema. Tenderness found.  Lymphadenopathy:    She has no cervical adenopathy.  Neurological: She is alert and oriented to person, place, and time.  Skin: Skin is warm and dry.  Psychiatric: She has a normal mood and affect.      Labs:  Estimated body mass index is 33.46 kg/(m^2) as calculated from the following:   Height as of 05/14/15: 5\' 2"  (1.575 m).   Weight as of 05/14/15: 83.008 kg (183 lb).   Imaging  Review Plain radiographs demonstrate severe degenerative joint disease of the left knee(s).  The bone quality appears to be good for age and reported activity level.  Assessment/Plan:  End stage arthritis, left knee   The patient history, physical examination, clinical judgment of the provider and imaging studies are consistent with end stage degenerative joint disease of the left knee(s) and total knee arthroplasty is deemed medically necessary. The treatment options including medical management, injection therapy arthroscopy and arthroplasty were discussed at length. The risks and benefits of total knee arthroplasty were presented and reviewed. The risks due to aseptic loosening, infection, stiffness, patella tracking problems, thromboembolic complications and other imponderables were discussed. The patient acknowledged the explanation, agreed to proceed with the plan and consent was signed. Patient is being admitted for inpatient treatment for surgery, pain control, PT, OT, prophylactic antibiotics, VTE prophylaxis, progressive ambulation and ADL's and discharge planning. The patient is planning to be discharged home with home health services.      [place] Pugh Degan Hanser   PA-C  09/17/2015, 8:18 AM

## 2015-09-17 NOTE — Patient Instructions (Addendum)
YOUR PROCEDURE IS SCHEDULED ON :  09/28/15  REPORT TO [facility] MAIN ENTRANCE FOLLOW SIGNS TO EAST ELEVATOR - GO TO 3rd FLOOR CHECK IN AT 3 EAST NURSES STATION (SHORT STAY) AT:  5:30 AM  CALL THIS NUMBER IF YOU HAVE PROBLEMS THE MORNING OF SURGERY [phone]  REMEMBER:ONLY 1 PER PERSON MAY GO TO SHORT STAY WITH YOU TO GET READY THE MORNING OF YOUR SURGERY  DO NOT EAT FOOD OR DRINK LIQUIDS AFTER MIDNIGHT  TAKE THESE MEDICINES THE MORNING OF SURGERY: LEVOTHYROXINE  YOU MAY NOT HAVE ANY METAL ON YOUR BODY INCLUDING HAIR PINS AND PIERCING'S. DO NOT WEAR JEWELRY, MAKEUP, LOTIONS, POWDERS OR PERFUMES. DO NOT WEAR NAIL POLISH. DO NOT SHAVE 48 HRS PRIOR TO SURGERY. MEN MAY SHAVE FACE AND NECK.  DO NOT [place] IS NOT RESPONSIBLE FOR VALUABLES.  CONTACTS, DENTURES OR PARTIALS MAY NOT BE WORN TO SURGERY. LEAVE SUITCASE IN CAR. CAN BE BROUGHT TO ROOM AFTER SURGERY.  PATIENTS DISCHARGED THE DAY OF SURGERY WILL NOT BE ALLOWED TO DRIVE HOME.  PLEASE READ OVER THE FOLLOWING INSTRUCTION SHEETS _________________________________________________________________________________                                           - PREPARING FOR SURGERY  Before surgery, you can play an important role.  Because skin is not sterile, your skin needs to be as free of germs as possible.  You can reduce the number of germs on your skin by washing with CHG (chlorahexidine gluconate) soap before surgery.  CHG is an antiseptic cleaner which kills germs and bonds with the skin to continue killing germs even after washing. Please DO NOT use if you have an allergy to CHG or antibacterial soaps.  If your skin becomes reddened/irritated stop using the CHG and inform your nurse when you arrive at Short Stay. Do not shave (including legs and underarms) for at least 48 hours prior to the first CHG shower.  You may shave your face. Please follow these instructions  carefully:   1.  Shower with CHG Soap the night before surgery and the  morning of Surgery.   2.  If you choose to wash your hair, wash your hair first as usual with your  normal  Shampoo.   3.  After you shampoo, rinse your hair and body thoroughly to remove the  shampoo.                                         4.  Use CHG as you would any other liquid soap.  You can apply chg directly  to the skin and wash . Gently wash with scrungie or clean wascloth    5.  Apply the CHG Soap to your body ONLY FROM THE NECK DOWN.   Do not use on open                           Wound or open sores. Avoid contact with eyes, ears mouth and genitals (private parts).                        Genitals (private parts) with your normal soap.  6.  Wash thoroughly, paying special attention to the area where your surgery  will be performed.   7.  Thoroughly rinse your body with warm water from the neck down.   8.  DO NOT shower/wash with your normal soap after using and rinsing off  the CHG Soap .                9.  Pat yourself dry with a clean towel.             10.  Wear clean night clothes to bed after shower             11.  Place clean sheets on your bed the night of your first shower and do not  sleep with pets.  Day of Surgery : Do not apply any lotions/deodorants the morning of surgery.  Please wear clean clothes to the hospital/surgery center.  FAILURE TO FOLLOW THESE INSTRUCTIONS MAY RESULT IN THE CANCELLATION OF YOUR SURGERY    PATIENT SIGNATURE_________________________________  ______________________________________________________________________     [name]  An incentive spirometer is a tool that can help keep your lungs clear and active. This tool measures how well you are filling your lungs with each breath. Taking long deep breaths may help reverse or decrease the chance of developing breathing (pulmonary) problems (especially infection) following:  A long  period of time when you are unable to move or be active. BEFORE THE PROCEDURE   If the spirometer includes an indicator to show your best effort, your nurse or respiratory therapist will set it to a desired goal.  If possible, sit up straight or lean slightly forward. Try not to slouch.  Hold the incentive spirometer in an upright position. INSTRUCTIONS FOR USE   Sit on the edge of your bed if possible, or sit up as far as you can in bed or on a chair.  Hold the incentive spirometer in an upright position.  Breathe out normally.  Place the mouthpiece in your mouth and seal your lips tightly around it.  Breathe in slowly and as deeply as possible, raising the piston or the ball toward the top of the column.  Hold your breath for 3-5 seconds or for as long as possible. Allow the piston or ball to fall to the bottom of the column.  Remove the mouthpiece from your mouth and breathe out normally.  Rest for a few seconds and repeat Steps 1 through 7 at least 10 times every 1-2 hours when you are awake. Take your time and take a few normal breaths between deep breaths.  The spirometer may include an indicator to show your best effort. Use the indicator as a goal to work toward during each repetition.  After each set of 10 deep breaths, practice coughing to be sure your lungs are clear. If you have an incision (the cut made at the time of surgery), support your incision when coughing by placing a pillow or rolled up towels firmly against it. Once you are able to get out of bed, walk around indoors and cough well. You may stop using the incentive spirometer when instructed by your caregiver.  RISKS AND COMPLICATIONS  Take your time so you do not get dizzy or light-headed.  If you are in pain, you may need to take or ask for pain medication before doing incentive spirometry. It is harder to take a deep breath if you are having pain. AFTER USE  Rest and breathe slowly and easily.  It can  be helpful to keep track of a log of your progress. Your caregiver can provide you with a simple table to help with this. If you are using the spirometer at home, follow these instructions: [place] IF:   You are having difficultly using the spirometer.  You have trouble using the spirometer as often as instructed.  Your pain medication is not giving enough relief while using the spirometer.  You develop fever of 100.5 F (38.1 C) or higher. SEEK IMMEDIATE MEDICAL CARE IF:   You cough up bloody sputum that had not been present before.  You develop fever of 102 F (38.9 C) or greater.  You develop worsening pain at or near the incision site. MAKE SURE YOU:   Understand these instructions.  Will watch your condition.  Will get help right away if you are not doing well or get worse. Document Released: 11/21/2006 Document Revised: 10/03/2011 Document Reviewed: 01/22/2007 ExitCare Patient Information 2014 Exit[employer], [place].   ________________________________________________________________________  WHAT IS A BLOOD TRANSFUSION? Blood Transfusion Information  A transfusion is the replacement of blood or some of its parts. Blood is made up of multiple cells which provide different functions.  Red blood cells carry oxygen and are used for blood loss replacement.  White blood cells fight against infection.  Platelets control bleeding.  Plasma helps clot blood.  Other blood products are available for specialized needs, such as hemophilia or other clotting disorders. BEFORE THE TRANSFUSION  Who gives blood for transfusions?   Healthy volunteers who are fully evaluated to make sure their blood is safe. This is blood bank blood. Transfusion therapy is the safest it has ever been in the practice of medicine. Before blood is taken from a donor, a complete history is taken to make sure that person has no history of diseases nor engages in risky social behavior (examples are  intravenous drug use or sexual activity with multiple partners). The donor's travel history is screened to minimize risk of transmitting infections, such as malaria. The donated blood is tested for signs of infectious diseases, such as HIV and hepatitis. The blood is then tested to be sure it is compatible with you in order to minimize the chance of a transfusion reaction. If you or a relative donates blood, this is often done in anticipation of surgery and is not appropriate for emergency situations. It takes many days to process the donated blood. RISKS AND COMPLICATIONS Although transfusion therapy is very safe and saves many lives, the main dangers of transfusion include:   Getting an infectious disease.  Developing a transfusion reaction. This is an allergic reaction to something in the blood you were given. Every precaution is taken to prevent this. The decision to have a blood transfusion has been considered carefully by your caregiver before blood is given. Blood is not given unless the benefits outweigh the risks. AFTER THE TRANSFUSION  Right after receiving a blood transfusion, you will usually feel much better and more energetic. This is especially true if your red blood cells have gotten low (anemic). The transfusion raises the level of the red blood cells which carry oxygen, and this usually causes an energy increase.  The nurse administering the transfusion will monitor you carefully for complications. HOME CARE INSTRUCTIONS  No special instructions are needed after a transfusion. You may find your energy is better. Speak with your caregiver about any limitations on activity for underlying diseases you may have. SEEK MEDICAL CARE IF:   Your  condition is not improving after your transfusion.  You develop redness or irritation at the intravenous (IV) site. SEEK IMMEDIATE MEDICAL CARE IF:  Any of the following symptoms occur over the next 12 hours:  Shaking chills.  You have a  temperature by mouth above 102 F (38.9 C), not controlled by medicine.  Chest, back, or muscle pain.  People around you feel you are not acting correctly or are confused.  Shortness of breath or difficulty breathing.  Dizziness and fainting.  You get a rash or develop hives.  You have a decrease in urine output.  Your urine turns a dark color or changes to pink, red, or brown. Any of the following symptoms occur over the next 10 days:  You have a temperature by mouth above 102 F (38.9 C), not controlled by medicine.  Shortness of breath.  Weakness after normal activity.  The white part of the eye turns yellow (jaundice).  You have a decrease in the amount of urine or are urinating less often.  Your urine turns a dark color or changes to pink, red, or brown. Document Released: 07/08/2000 Document Revised: 10/03/2011 Document Reviewed: 02/25/2008 [facility] Patient Information 2014 [place], [place].  _______________________________________________________________________

## 2015-09-21 ENCOUNTER — Encounter (HOSPITAL_COMMUNITY)
Admission: RE | Admit: 2015-09-21 | Discharge: 2015-09-21 | Disposition: A | Discharge status: Home / Self Care | Payer: PPO | Source: Ambulatory Visit | Attending: Orthopedic Surgery | Admitting: Orthopedic Surgery

## 2015-09-21 ENCOUNTER — Encounter (HOSPITAL_COMMUNITY): Payer: Self-pay

## 2015-09-21 DIAGNOSIS — I10 Essential (primary) hypertension: Secondary | ICD-10-CM | POA: Diagnosis not present

## 2015-09-21 DIAGNOSIS — Z01818 Encounter for other preprocedural examination: Secondary | ICD-10-CM | POA: Insufficient documentation

## 2015-09-21 DIAGNOSIS — M1712 Unilateral primary osteoarthritis, left knee: Secondary | ICD-10-CM | POA: Insufficient documentation

## 2015-09-21 DIAGNOSIS — R9431 Abnormal electrocardiogram [ECG] [EKG]: Secondary | ICD-10-CM | POA: Diagnosis not present

## 2015-09-21 DIAGNOSIS — Z01812 Encounter for preprocedural laboratory examination: Secondary | ICD-10-CM | POA: Insufficient documentation

## 2015-09-21 DIAGNOSIS — Z0183 Encounter for blood typing: Secondary | ICD-10-CM | POA: Insufficient documentation

## 2015-09-21 HISTORY — DX: Ventricular premature depolarization: I49.3

## 2015-09-21 HISTORY — DX: Unspecified osteoarthritis, unspecified site: M19.90

## 2015-09-21 HISTORY — DX: Personal history of other malignant neoplasm of skin: Z85.828

## 2015-09-21 LAB — CBC
HCT: 45.3 % (ref 36.0–46.0)
Hemoglobin: 14.8 g/dL (ref 12.0–15.0)
MCH: 31.2 pg (ref 26.0–34.0)
MCHC: 32.7 g/dL (ref 30.0–36.0)
MCV: 95.6 fL (ref 78.0–100.0)
PLATELETS: 246 10*3/uL (ref 150–400)
RBC: 4.74 MIL/uL (ref 3.87–5.11)
RDW: 12.8 % (ref 11.5–15.5)
WBC: 11.3 10*3/uL — AB (ref 4.0–10.5)

## 2015-09-21 LAB — SURGICAL PCR SCREEN
MRSA, PCR: NEGATIVE
Staphylococcus aureus: NEGATIVE

## 2015-09-21 LAB — URINALYSIS, ROUTINE W REFLEX MICROSCOPIC
BILIRUBIN URINE: NEGATIVE
GLUCOSE, UA: NEGATIVE mg/dL
HGB URINE DIPSTICK: NEGATIVE
Ketones, ur: NEGATIVE mg/dL
Leukocytes, UA: NEGATIVE
Nitrite: NEGATIVE
PH: 7 (ref 5.0–8.0)
Protein, ur: NEGATIVE mg/dL
SPECIFIC GRAVITY, URINE: 1.02 (ref 1.005–1.030)

## 2015-09-21 LAB — PROTIME-INR
INR: 1.12 (ref 0.00–1.49)
Prothrombin Time: 14.1 seconds (ref 11.6–15.2)

## 2015-09-21 LAB — ABO/RH: ABO/RH(D): AB NEG

## 2015-09-21 LAB — APTT: aPTT: 25 seconds (ref 24–37)

## 2015-09-21 LAB — BASIC METABOLIC PANEL
Anion gap: 9 (ref 5–15)
BUN: 26 mg/dL — ABNORMAL HIGH (ref 6–20)
CHLORIDE: 106 mmol/L (ref 101–111)
CO2: 28 mmol/L (ref 22–32)
CREATININE: 0.9 mg/dL (ref 0.44–1.00)
Calcium: 9.2 mg/dL (ref 8.9–10.3)
Glucose, Bld: 91 mg/dL (ref 65–99)
POTASSIUM: 3.6 mmol/L (ref 3.5–5.1)
SODIUM: 143 mmol/L (ref 135–145)

## 2015-09-28 ENCOUNTER — Encounter (HOSPITAL_COMMUNITY): Payer: Self-pay | Admitting: Anesthesiology

## 2015-09-28 ENCOUNTER — Inpatient Hospital Stay (HOSPITAL_COMMUNITY): Payer: PPO | Admitting: Anesthesiology

## 2015-09-28 ENCOUNTER — Inpatient Hospital Stay (HOSPITAL_COMMUNITY)
Admission: RE | Admit: 2015-09-28 | Discharge: 2015-09-30 | DRG: 470 | Disposition: A | Discharge status: Home Health | Payer: PPO | Source: Ambulatory Visit | Attending: Orthopedic Surgery | Admitting: Orthopedic Surgery

## 2015-09-28 ENCOUNTER — Encounter (HOSPITAL_COMMUNITY)
Admission: RE | Disposition: A | Discharge status: Home Health | Payer: Self-pay | Source: Ambulatory Visit | Attending: Orthopedic Surgery

## 2015-09-28 DIAGNOSIS — Z6830 Body mass index (BMI) 30.0-30.9, adult: Secondary | ICD-10-CM

## 2015-09-28 DIAGNOSIS — M659 Synovitis and tenosynovitis, unspecified: Secondary | ICD-10-CM | POA: Diagnosis present

## 2015-09-28 DIAGNOSIS — M1712 Unilateral primary osteoarthritis, left knee: Principal | ICD-10-CM | POA: Diagnosis present

## 2015-09-28 DIAGNOSIS — Z96652 Presence of left artificial knee joint: Secondary | ICD-10-CM

## 2015-09-28 DIAGNOSIS — Z87891 Personal history of nicotine dependence: Secondary | ICD-10-CM | POA: Diagnosis not present

## 2015-09-28 DIAGNOSIS — E039 Hypothyroidism, unspecified: Secondary | ICD-10-CM | POA: Diagnosis not present

## 2015-09-28 DIAGNOSIS — M25562 Pain in left knee: Secondary | ICD-10-CM | POA: Diagnosis not present

## 2015-09-28 DIAGNOSIS — E669 Obesity, unspecified: Secondary | ICD-10-CM | POA: Diagnosis not present

## 2015-09-28 DIAGNOSIS — R32 Unspecified urinary incontinence: Secondary | ICD-10-CM | POA: Diagnosis present

## 2015-09-28 DIAGNOSIS — I10 Essential (primary) hypertension: Secondary | ICD-10-CM | POA: Diagnosis not present

## 2015-09-28 DIAGNOSIS — Z01812 Encounter for preprocedural laboratory examination: Secondary | ICD-10-CM

## 2015-09-28 DIAGNOSIS — M179 Osteoarthritis of knee, unspecified: Secondary | ICD-10-CM | POA: Diagnosis not present

## 2015-09-28 DIAGNOSIS — Z96659 Presence of unspecified artificial knee joint: Secondary | ICD-10-CM

## 2015-09-28 HISTORY — PX: TOTAL KNEE ARTHROPLASTY: SHX125

## 2015-09-28 LAB — TYPE AND SCREEN
ABO/RH(D): AB NEG
ANTIBODY SCREEN: NEGATIVE

## 2015-09-28 SURGERY — ARTHROPLASTY, KNEE, TOTAL
Anesthesia: General | Site: Knee | Laterality: Left

## 2015-09-28 MED ORDER — POLYETHYLENE GLYCOL 3350 17 G PO PACK
17.0000 g | PACK | Freq: Two times a day (BID) | ORAL | Status: DC
  Administered 2015-09-28 – 2015-09-30 (×3): 17 g via ORAL

## 2015-09-28 MED ORDER — MAGNESIUM CITRATE PO SOLN: 1.0000 | Freq: Once | ORAL | Status: DC | PRN

## 2015-09-28 MED ORDER — HYDROMORPHONE HCL 1 MG/ML IJ SOLN
0.5000 mg | INTRAMUSCULAR | Status: DC | PRN
  Administered 2015-09-28: 2 mg via INTRAVENOUS
  Administered 2015-09-28: 1 mg via INTRAVENOUS
  Administered 2015-09-28: 0.5 mg via INTRAVENOUS
  Administered 2015-09-29 (×2): 1 mg via INTRAVENOUS
  Filled 2015-09-28 (×2): qty 1
  Filled 2015-09-28: qty 2
  Filled 2015-09-28 (×2): qty 1

## 2015-09-28 MED ORDER — HYDROMORPHONE HCL 1 MG/ML IJ SOLN
0.2500 mg | INTRAMUSCULAR | Status: DC | PRN
  Administered 2015-09-28: 0.5 mg via INTRAVENOUS
  Administered 2015-09-28 (×2): 0.25 mg via INTRAVENOUS

## 2015-09-28 MED ORDER — FERROUS SULFATE 325 (65 FE) MG PO TABS
325.0000 mg | ORAL_TABLET | Freq: Three times a day (TID) | ORAL | Status: DC
  Administered 2015-09-28 – 2015-09-30 (×6): 325 mg via ORAL
  Filled 2015-09-28 (×8): qty 1

## 2015-09-28 MED ORDER — ONDANSETRON HCL 4 MG/2ML IJ SOLN
INTRAMUSCULAR | Status: DC | PRN
  Administered 2015-09-28: 4 mg via INTRAVENOUS

## 2015-09-28 MED ORDER — SODIUM CHLORIDE 0.9 % IJ SOLN
INTRAMUSCULAR | Status: AC
  Filled 2015-09-28: qty 10

## 2015-09-28 MED ORDER — PHENOL 1.4 % MT LIQD
1.0000 | OROMUCOSAL | Status: DC | PRN
  Filled 2015-09-28: qty 177

## 2015-09-28 MED ORDER — TRANEXAMIC ACID 1000 MG/10ML IV SOLN
1000.0000 mg | Freq: Once | INTRAVENOUS | Status: AC
  Administered 2015-09-28: 1000 mg via INTRAVENOUS
  Filled 2015-09-28: qty 10

## 2015-09-28 MED ORDER — KETOROLAC TROMETHAMINE 30 MG/ML IJ SOLN
INTRAMUSCULAR | Status: DC | PRN
  Administered 2015-09-28: 30 mg via INTRAVENOUS

## 2015-09-28 MED ORDER — SODIUM CHLORIDE 0.9 % IJ SOLN
INTRAMUSCULAR | Status: AC
  Filled 2015-09-28: qty 50

## 2015-09-28 MED ORDER — OXYCODONE HCL 5 MG PO TABS: 5.0000 mg | ORAL_TABLET | Freq: Once | ORAL | Status: DC | PRN

## 2015-09-28 MED ORDER — METHOCARBAMOL 1000 MG/10ML IJ SOLN
500.0000 mg | Freq: Four times a day (QID) | INTRAMUSCULAR | Status: DC | PRN
  Administered 2015-09-28: 500 mg via INTRAVENOUS
  Filled 2015-09-28 (×2): qty 5

## 2015-09-28 MED ORDER — DEXAMETHASONE SODIUM PHOSPHATE 10 MG/ML IJ SOLN: 10.0000 mg | Freq: Once | INTRAMUSCULAR | Status: DC

## 2015-09-28 MED ORDER — EPHEDRINE SULFATE 50 MG/ML IJ SOLN
INTRAMUSCULAR | Status: DC | PRN
  Administered 2015-09-28: 10 mg via INTRAVENOUS

## 2015-09-28 MED ORDER — BISACODYL 10 MG RE SUPP: 10.0000 mg | Freq: Every day | RECTAL | Status: DC | PRN

## 2015-09-28 MED ORDER — ORLISTAT 60 MG PO CAPS: 60.0000 mg | ORAL_CAPSULE | Freq: Every morning | ORAL | Status: DC

## 2015-09-28 MED ORDER — BUPIVACAINE HCL (PF) 0.25 % IJ SOLN
INTRAMUSCULAR | Status: AC
  Filled 2015-09-28: qty 30

## 2015-09-28 MED ORDER — SODIUM CHLORIDE 0.9 % IV SOLN
INTRAVENOUS | Status: DC
  Administered 2015-09-28 (×2): via INTRAVENOUS
  Filled 2015-09-28 (×7): qty 1000

## 2015-09-28 MED ORDER — PROPOFOL 10 MG/ML IV BOLUS
INTRAVENOUS | Status: DC | PRN
  Administered 2015-09-28: 150 mg via INTRAVENOUS

## 2015-09-28 MED ORDER — PROPOFOL 10 MG/ML IV BOLUS
INTRAVENOUS | Status: AC
  Filled 2015-09-28: qty 60

## 2015-09-28 MED ORDER — KETOROLAC TROMETHAMINE 30 MG/ML IJ SOLN
INTRAMUSCULAR | Status: AC
  Filled 2015-09-28: qty 1

## 2015-09-28 MED ORDER — FENTANYL CITRATE (PF) 100 MCG/2ML IJ SOLN
INTRAMUSCULAR | Status: AC
  Filled 2015-09-28: qty 2

## 2015-09-28 MED ORDER — HYDROMORPHONE HCL 2 MG/ML IJ SOLN
INTRAMUSCULAR | Status: AC
  Filled 2015-09-28: qty 1

## 2015-09-28 MED ORDER — DIPHENHYDRAMINE HCL 25 MG PO CAPS: 25.0000 mg | ORAL_CAPSULE | Freq: Four times a day (QID) | ORAL | Status: DC | PRN

## 2015-09-28 MED ORDER — ALUM & MAG HYDROXIDE-SIMETH 200-200-20 MG/5ML PO SUSP
30.0000 mL | ORAL | Status: DC | PRN
  Administered 2015-09-29: 30 mL via ORAL
  Filled 2015-09-28: qty 30

## 2015-09-28 MED ORDER — HYDROMORPHONE HCL 1 MG/ML IJ SOLN
INTRAMUSCULAR | Status: AC
  Filled 2015-09-28: qty 1

## 2015-09-28 MED ORDER — PROPOFOL 500 MG/50ML IV EMUL
INTRAVENOUS | Status: DC | PRN
  Administered 2015-09-28: 140 ug/kg/min via INTRAVENOUS

## 2015-09-28 MED ORDER — EPHEDRINE SULFATE 50 MG/ML IJ SOLN
INTRAMUSCULAR | Status: AC
  Filled 2015-09-28: qty 1

## 2015-09-28 MED ORDER — CHLORHEXIDINE GLUCONATE 4 % EX LIQD: 60.0000 mL | Freq: Once | CUTANEOUS | Status: DC

## 2015-09-28 MED ORDER — ONDANSETRON HCL 4 MG/2ML IJ SOLN
INTRAMUSCULAR | Status: AC
  Filled 2015-09-28: qty 2

## 2015-09-28 MED ORDER — CELECOXIB 200 MG PO CAPS
200.0000 mg | ORAL_CAPSULE | Freq: Two times a day (BID) | ORAL | Status: DC
  Administered 2015-09-28 – 2015-09-30 (×4): 200 mg via ORAL
  Filled 2015-09-28 (×4): qty 1

## 2015-09-28 MED ORDER — ACETAMINOPHEN 10 MG/ML IV SOLN
INTRAVENOUS | Status: AC
Start: 2015-09-28 — End: 2015-09-28
  Filled 2015-09-28: qty 100

## 2015-09-28 MED ORDER — FENTANYL CITRATE (PF) 100 MCG/2ML IJ SOLN
INTRAMUSCULAR | Status: DC | PRN
  Administered 2015-09-28 (×5): 50 ug via INTRAVENOUS

## 2015-09-28 MED ORDER — LIDOCAINE HCL (CARDIAC) 20 MG/ML IV SOLN
INTRAVENOUS | Status: AC
  Filled 2015-09-28: qty 5

## 2015-09-28 MED ORDER — HYDROCHLOROTHIAZIDE 25 MG PO TABS
25.0000 mg | ORAL_TABLET | Freq: Every day | ORAL | Status: DC
  Administered 2015-09-28 – 2015-09-30 (×2): 25 mg via ORAL
  Filled 2015-09-28 (×3): qty 1

## 2015-09-28 MED ORDER — PHENYLEPHRINE 40 MCG/ML (10ML) SYRINGE FOR IV PUSH (FOR BLOOD PRESSURE SUPPORT)
PREFILLED_SYRINGE | INTRAVENOUS | Status: AC
Start: 2015-09-28 — End: 2015-09-28
  Filled 2015-09-28: qty 10

## 2015-09-28 MED ORDER — MENTHOL 3 MG MT LOZG: 1.0000 | LOZENGE | OROMUCOSAL | Status: DC | PRN

## 2015-09-28 MED ORDER — SODIUM CHLORIDE 0.9 % IR SOLN
Status: DC | PRN
  Administered 2015-09-28: 1000 mL

## 2015-09-28 MED ORDER — DOCUSATE SODIUM 100 MG PO CAPS
100.0000 mg | ORAL_CAPSULE | Freq: Two times a day (BID) | ORAL | Status: DC
  Administered 2015-09-28 – 2015-09-30 (×4): 100 mg via ORAL

## 2015-09-28 MED ORDER — DEXAMETHASONE SODIUM PHOSPHATE 10 MG/ML IJ SOLN
INTRAMUSCULAR | Status: AC
  Filled 2015-09-28: qty 1

## 2015-09-28 MED ORDER — BUPIVACAINE-EPINEPHRINE (PF) 0.25% -1:200000 IJ SOLN
INTRAMUSCULAR | Status: AC
  Filled 2015-09-28: qty 30

## 2015-09-28 MED ORDER — ACETAMINOPHEN 160 MG/5ML PO SOLN: 325.0000 mg | ORAL | Status: DC | PRN

## 2015-09-28 MED ORDER — OXYCODONE HCL 5 MG/5ML PO SOLN
5.0000 mg | Freq: Once | ORAL | Status: DC | PRN
  Filled 2015-09-28: qty 5

## 2015-09-28 MED ORDER — LEVOTHYROXINE SODIUM 125 MCG PO TABS
125.0000 ug | ORAL_TABLET | Freq: Every day | ORAL | Status: DC
  Administered 2015-09-29 – 2015-09-30 (×2): 125 ug via ORAL
  Filled 2015-09-28 (×3): qty 1

## 2015-09-28 MED ORDER — BUPIVACAINE HCL (PF) 0.25 % IJ SOLN
INTRAMUSCULAR | Status: DC | PRN
  Administered 2015-09-28: 30 mL

## 2015-09-28 MED ORDER — RIVAROXABAN 10 MG PO TABS
10.0000 mg | ORAL_TABLET | ORAL | Status: DC
  Administered 2015-09-29 – 2015-09-30 (×2): 10 mg via ORAL
  Filled 2015-09-28 (×3): qty 1

## 2015-09-28 MED ORDER — MIDAZOLAM HCL 2 MG/2ML IJ SOLN
INTRAMUSCULAR | Status: AC
  Filled 2015-09-28: qty 2

## 2015-09-28 MED ORDER — MIDAZOLAM HCL 5 MG/5ML IJ SOLN
INTRAMUSCULAR | Status: DC | PRN
  Administered 2015-09-28 (×2): 1 mg via INTRAVENOUS

## 2015-09-28 MED ORDER — CEFAZOLIN SODIUM-DEXTROSE 2-3 GM-% IV SOLR
2.0000 g | INTRAVENOUS | Status: AC
  Administered 2015-09-28: 2 g via INTRAVENOUS

## 2015-09-28 MED ORDER — METOCLOPRAMIDE HCL 5 MG/ML IJ SOLN: 5.0000 mg | Freq: Three times a day (TID) | INTRAMUSCULAR | Status: DC | PRN

## 2015-09-28 MED ORDER — METHOCARBAMOL 500 MG PO TABS
500.0000 mg | ORAL_TABLET | Freq: Four times a day (QID) | ORAL | Status: DC | PRN
  Administered 2015-09-29 – 2015-09-30 (×4): 500 mg via ORAL
  Filled 2015-09-28 (×4): qty 1

## 2015-09-28 MED ORDER — ONDANSETRON HCL 4 MG/2ML IJ SOLN: 4.0000 mg | Freq: Four times a day (QID) | INTRAMUSCULAR | Status: DC | PRN

## 2015-09-28 MED ORDER — ACETAMINOPHEN 10 MG/ML IV SOLN
INTRAVENOUS | Status: DC | PRN
  Administered 2015-09-28: 1000 mg via INTRAVENOUS

## 2015-09-28 MED ORDER — BUPIVACAINE IN DEXTROSE 0.75-8.25 % IT SOLN
INTRATHECAL | Status: DC | PRN
  Administered 2015-09-28: 1.8 mL via INTRATHECAL

## 2015-09-28 MED ORDER — CEFAZOLIN SODIUM-DEXTROSE 2-3 GM-% IV SOLR
2.0000 g | Freq: Four times a day (QID) | INTRAVENOUS | Status: AC
  Administered 2015-09-28 (×2): 2 g via INTRAVENOUS
  Filled 2015-09-28 (×2): qty 50

## 2015-09-28 MED ORDER — ACETAMINOPHEN 325 MG PO TABS: 325.0000 mg | ORAL_TABLET | ORAL | Status: DC | PRN

## 2015-09-28 MED ORDER — TRANEXAMIC ACID 1000 MG/10ML IV SOLN: 1000.0000 mg | Freq: Once | INTRAVENOUS | Status: DC

## 2015-09-28 MED ORDER — DEXAMETHASONE SODIUM PHOSPHATE 10 MG/ML IJ SOLN
10.0000 mg | Freq: Once | INTRAMUSCULAR | Status: AC
  Administered 2015-09-29: 10 mg via INTRAVENOUS
  Filled 2015-09-28: qty 1

## 2015-09-28 MED ORDER — SODIUM CHLORIDE 0.9 % IJ SOLN
INTRAMUSCULAR | Status: DC | PRN
  Administered 2015-09-28: 30 mL

## 2015-09-28 MED ORDER — ONDANSETRON HCL 4 MG PO TABS: 4.0000 mg | ORAL_TABLET | Freq: Four times a day (QID) | ORAL | Status: DC | PRN

## 2015-09-28 MED ORDER — METOCLOPRAMIDE HCL 10 MG PO TABS: 5.0000 mg | ORAL_TABLET | Freq: Three times a day (TID) | ORAL | Status: DC | PRN

## 2015-09-28 MED ORDER — HYDROMORPHONE HCL 1 MG/ML IJ SOLN
INTRAMUSCULAR | Status: DC | PRN
  Administered 2015-09-28: 0.5 mg via INTRAVENOUS
  Administered 2015-09-28 (×2): .4 mg via INTRAVENOUS

## 2015-09-28 MED ORDER — DEXAMETHASONE SODIUM PHOSPHATE 10 MG/ML IJ SOLN
10.0000 mg | Freq: Once | INTRAMUSCULAR | Status: AC
  Administered 2015-09-28: 10 mg via INTRAVENOUS

## 2015-09-28 MED ORDER — CEFAZOLIN SODIUM-DEXTROSE 2-3 GM-% IV SOLR
INTRAVENOUS | Status: AC
  Filled 2015-09-28: qty 50

## 2015-09-28 MED ORDER — LACTATED RINGERS IV SOLN
INTRAVENOUS | Status: DC | PRN
  Administered 2015-09-28 (×2): via INTRAVENOUS

## 2015-09-28 MED ORDER — OXYCODONE HCL 5 MG PO TABS
5.0000 mg | ORAL_TABLET | ORAL | Status: DC
  Administered 2015-09-28: 10 mg via ORAL
  Administered 2015-09-28 – 2015-09-29 (×3): 15 mg via ORAL
  Administered 2015-09-29: 5 mg via ORAL
  Administered 2015-09-29: 15 mg via ORAL
  Administered 2015-09-29: 10 mg via ORAL
  Administered 2015-09-29: 15 mg via ORAL
  Administered 2015-09-29 – 2015-09-30 (×4): 10 mg via ORAL
  Filled 2015-09-28 (×2): qty 2
  Filled 2015-09-28: qty 1
  Filled 2015-09-28: qty 2
  Filled 2015-09-28: qty 3
  Filled 2015-09-28: qty 2
  Filled 2015-09-28 (×4): qty 3
  Filled 2015-09-28 (×2): qty 2

## 2015-09-28 SURGICAL SUPPLY — 47 items
BAG DECANTER FOR FLEXI CONT (MISCELLANEOUS) IMPLANT
BAG ZIPLOCK 12X15 (MISCELLANEOUS) IMPLANT
BANDAGE ACE 6X5 VEL STRL LF (GAUZE/BANDAGES/DRESSINGS) ×2 IMPLANT
BLADE SAW SGTL 13.0X1.19X90.0M (BLADE) ×2 IMPLANT
BOWL SMART MIX CTS (DISPOSABLE) ×2 IMPLANT
CAPT KNEE TOTAL 3 ATTUNE ×2 IMPLANT
CEMENT HV SMART SET (Cement) ×4 IMPLANT
CLOTH BEACON ORANGE TIMEOUT ST (SAFETY) ×2 IMPLANT
CUFF TOURN SGL QUICK 34 (TOURNIQUET CUFF) ×1
CUFF TRNQT CYL 34X4X40X1 (TOURNIQUET CUFF) ×1 IMPLANT
DECANTER SPIKE VIAL GLASS SM (MISCELLANEOUS) ×2 IMPLANT
DRAPE U-SHAPE 47X51 STRL (DRAPES) ×2 IMPLANT
DRESSING AQUACEL AG SP 3.5X10 (GAUZE/BANDAGES/DRESSINGS) ×1 IMPLANT
DRSG AQUACEL AG ADV 3.5X10 (GAUZE/BANDAGES/DRESSINGS) ×2 IMPLANT
DRSG AQUACEL AG SP 3.5X10 (GAUZE/BANDAGES/DRESSINGS) ×2
DURAPREP 26ML APPLICATOR (WOUND CARE) ×4 IMPLANT
ELECT REM PT RETURN 9FT ADLT (ELECTROSURGICAL) ×2
ELECTRODE REM PT RTRN 9FT ADLT (ELECTROSURGICAL) ×1 IMPLANT
GLOVE BIOGEL M 7.0 STRL (GLOVE) IMPLANT
GLOVE BIOGEL PI IND STRL 7.5 (GLOVE) ×1 IMPLANT
GLOVE BIOGEL PI IND STRL 8.5 (GLOVE) ×1 IMPLANT
GLOVE BIOGEL PI INDICATOR 7.5 (GLOVE) ×1
GLOVE BIOGEL PI INDICATOR 8.5 (GLOVE) ×1
GLOVE ECLIPSE 8.0 STRL XLNG CF (GLOVE) ×2 IMPLANT
GLOVE ORTHO TXT STRL SZ7.5 (GLOVE) ×4 IMPLANT
GOWN STRL REUS W/TWL LRG LVL3 (GOWN DISPOSABLE) ×2 IMPLANT
GOWN STRL REUS W/TWL XL LVL3 (GOWN DISPOSABLE) ×2 IMPLANT
HANDPIECE INTERPULSE COAX TIP (DISPOSABLE) ×1
LIQUID BAND (GAUZE/BANDAGES/DRESSINGS) ×2 IMPLANT
MANIFOLD NEPTUNE II (INSTRUMENTS) ×2 IMPLANT
PACK TOTAL KNEE CUSTOM (KITS) ×2 IMPLANT
POSITIONER SURGICAL ARM (MISCELLANEOUS) ×2 IMPLANT
SET HNDPC FAN SPRY TIP SCT (DISPOSABLE) ×1 IMPLANT
SET PAD KNEE POSITIONER (MISCELLANEOUS) ×2 IMPLANT
SUCTION FRAZIER HANDLE 12FR (TUBING) ×1
SUCTION TUBE FRAZIER 12FR DISP (TUBING) ×1 IMPLANT
SUT MNCRL AB 4-0 PS2 18 (SUTURE) ×2 IMPLANT
SUT VIC AB 1 CT1 36 (SUTURE) ×2 IMPLANT
SUT VIC AB 2-0 CT1 27 (SUTURE) ×3
SUT VIC AB 2-0 CT1 TAPERPNT 27 (SUTURE) ×3 IMPLANT
SUT VLOC 180 0 24IN GS25 (SUTURE) ×2 IMPLANT
SYR 50ML LL SCALE MARK (SYRINGE) ×2 IMPLANT
TRAY FOLEY W/METER SILVER 14FR (SET/KITS/TRAYS/PACK) ×2 IMPLANT
TRAY FOLEY W/METER SILVER 16FR (SET/KITS/TRAYS/PACK) ×2 IMPLANT
WATER STERILE IRR 1500ML POUR (IV SOLUTION) ×2 IMPLANT
WRAP KNEE MAXI GEL POST OP (GAUZE/BANDAGES/DRESSINGS) ×2 IMPLANT
YANKAUER SUCT BULB TIP 10FT TU (MISCELLANEOUS) ×2 IMPLANT

## 2015-09-28 NOTE — Transfer of Care (Signed)
Immediate Anesthesia Transfer of Care Note  Patient: Heidi Adams  Procedure(s) Performed: Procedure(s): LEFT TOTAL KNEE ARTHROPLASTY (Left)  Patient Location: PACU  Anesthesia Type:General  Level of Consciousness: awake  Airway & Oxygen Therapy: Patient Spontanous Breathing and Patient connected to face mask oxygen  Post-op Assessment: Report given to RN and Post -op Vital signs reviewed and stable  Post vital signs: Reviewed and stable  Last Vitals:  Filed Vitals:   09/28/15 0520  BP: 138/77  Pulse: 78  Temp: 36.7 C  Resp: 18    Complications: No apparent anesthesia complications

## 2015-09-28 NOTE — Op Note (Signed)
NAME:  Heidi Adams                      MEDICAL RECORD NO.:  [id number]                             FACILITY:  [facility]      PHYSICIAN:  Pietro Cassis. Alvan Dame, M.D.  DATE OF BIRTH:  [birth year]      DATE OF PROCEDURE:  09/28/2015                                     OPERATIVE REPORT         PREOPERATIVE DIAGNOSIS:  Left knee osteoarthritis.      POSTOPERATIVE DIAGNOSIS:  Left knee osteoarthritis.      FINDINGS:  The patient was noted to have complete loss of cartilage and   bone-on-bone arthritis with associated osteophytes in the lateral and patellofemoral compartments of   the knee with a significant synovitis and associated effusion.      PROCEDURE:  Left total knee replacement.      COMPONENTS USED:  DePuy Attune rotating platform posterior stabilized knee   system, a size 4N femur, 4 tibia, size 5 mm PS AOX insert, and 35 anatomic patellar   button.      SURGEON:  Pietro Cassis. Alvan Dame, M.D.      ASSISTANT:  Danae Orleans, PA-C.      ANESTHESIA:  General and Spinal.      SPECIMENS:  None.      COMPLICATION:  None.      DRAINS:  None  EBL: <50cc      TOURNIQUET TIME:   Total Tourniquet Time Documented: Thigh (Left) - 32 minutes Total: Thigh (Left) - 32 minutes  .      The patient was stable to the recovery room.      INDICATION FOR PROCEDURE:  Heidi Adams is a 80 y.o. female patient of   mine.  The patient had been seen, evaluated, and treated conservatively in the   office with medication, activity modification, and injections.  The patient had   radiographic changes of bone-on-bone arthritis with endplate sclerosis and osteophytes noted.      The patient failed conservative measures including medication, injections, and activity modification, and at this point was ready for more definitive measures.   Based on the radiographic changes and failed conservative measures, the patient   decided to proceed with total knee replacement.  Risks of infection,   DVT, component  failure, need for revision surgery, postop course, and   expectations were all   discussed and reviewed.  Consent was obtained for benefit of pain   relief.      PROCEDURE IN DETAIL:  The patient was brought to the operative theater.   Once adequate anesthesia, preoperative antibiotics, 2 gm of Ancef, 1 gm of tranexamic Acid, and 10 mg of Decadron administered, the patient was positioned supine with the left thigh tourniquet placed.  The  left lower extremity was prepped and draped in sterile fashion.  A time-   out was performed identifying the patient, planned procedure, and   extremity.      The left lower extremity was placed in the [facility] leg holder.  The leg was   exsanguinated, tourniquet elevated to 250 mmHg.  A midline incision was  made followed by median parapatellar arthrotomy.  Following initial   exposure, attention was first directed to the patella.  Precut   measurement was noted to be 22 mm.  I resected down to 14 mm and used a   35 anatomic patellar button to restore patellar height as well as cover the cut   surface.      The lug holes were drilled and a metal shim was placed to protect the   patella from retractors and saw blades.      At this point, attention was now directed to the femur.  The femoral   canal was opened with a drill, irrigated to try to prevent fat emboli.  An   intramedullary rod was passed at 5 degrees valgus, 9 mm of bone was   resected off the distal femur.  Following this resection, the tibia was   subluxated anteriorly.  Using the extramedullary guide, 4 (2 initially then re-cut an additional 2) mm of bone was resected off   the proximal latertal tibia.  We confirmed the gap would be   stable medially and laterally with a size 5 mm insert as well as confirmed   the cut was perpendicular in the coronal plane, checking with an alignment rod.      Once this was done, I sized the femur to be a size 4 in the anterior-   posterior dimension,  chose a narrow component based on medial and   lateral dimension.  The size 4 rotation block was then pinned in   position anterior referenced using the C-clamp to set rotation.  The   anterior, posterior, and  chamfer cuts were made without difficulty nor   notching making certain that I was along the anterior cortex to help   with flexion gap stability.      The final box cut was made off the lateral aspect of distal femur.      At this point, the tibia was sized to be a size 4, the size 4 tray was   then pinned in position through the medial third of the tubercle,   drilled, and keel punched.  Trial reduction was now carried with a 4 femur,  4 tibia, a size 5 mm insert, and the 35 patella botton.  The knee was brought to   extension, full extension with good flexion stability with the patella   tracking through the trochlea without application of pressure.  Given   all these findings the femoral lug holes were drilled and then the trial components removed.  Final components were   opened and cement was mixed.  The knee was irrigated with normal saline   solution and pulse lavage.  The synovial lining was   then injected with 30 cc of  0.25% Marcaine with epinephrine and 1 cc of Toradol plus 30 cc of NS for a  total of 61 cc.      The knee was irrigated.  Final implants were then cemented onto clean and   dried cut surfaces of bone with the knee brought to extension with a size 5 mm trial insert.      Once the cement had fully cured, the excess cement was removed   throughout the knee.  I confirmed I was satisfied with the range of   motion and stability, and the final size 5 mm PS AOX insert was chosen.  It was   placed into the knee.      The  tourniquet had been let down at 32 minutes.  No significant   hemostasis required.  The   extensor mechanism was then reapproximated using #1 Vicryl and #0 V-lock sutures with the knee   in flexion.  The   remaining wound was closed with 2-0  Vicryl and running 4-0 Monocryl.   The knee was cleaned, dried, dressed sterilely using Dermabond and   Aquacel dressing.  The patient was then   brought to recovery room in stable condition, tolerating the procedure   well.   Please note that Physician Assistant, Danae Orleans, PA-C, was present for the entirety of the case, and was utilized for pre-operative positioning, peri-operative retractor management, general facilitation of the procedure.  He was also utilized for primary wound closure at the end of the case.              Pietro Cassis Alvan Dame, M.D.    09/28/2015 8:54 AM

## 2015-09-28 NOTE — Progress Notes (Signed)
6th floor notified pt will be in 1610 in 20 minutes-needs bed.

## 2015-09-28 NOTE — Evaluation (Signed)
Physical Therapy Evaluation Patient Details Name: Heidi Adams MRN: [id number] DOB: 11/23/1935 Today's Date: 09/28/2015   History of Present Illness  [name]  Clinical Impression  Pt is s/p TKA resulting in the deficits listed below (see PT Problem List).  Pt will benefit from skilled PT to increase their independence and safety with mobility to allow discharge to the venue listed below. Pt needs RW and HHPT     Follow Up Recommendations Home health PT    Equipment Recommendations  Rolling walker with 5" wheels    Recommendations for Other Services       Precautions / Restrictions Precautions Precautions: Knee Restrictions Weight Bearing Restrictions: No Other Position/Activity Restrictions: WBAT      Mobility  Bed Mobility Overal bed mobility: Needs Assistance Bed Mobility: Supine to Sit     Supine to sit: Min assist     General bed mobility comments: with LLE, cues to self assist  Transfers Overall transfer level: Needs assistance Equipment used: Rolling walker (2 wheeled) Transfers: Sit to/from Stand Sit to Stand: Min assist         General transfer comment: assist to rise and steady in standing, cues for hand placement  Ambulation/Gait Ambulation/Gait assistance: Min assist Ambulation Distance (Feet):  (pt dizzy with OOB/gait but unchanging during distance, RN reports pt c/o same in bed earlier) Assistive device: Rolling walker (2 wheeled) Gait Pattern/deviations: Step-to pattern;Antalgic;Trunk flexed Gait velocity: decr   General Gait Details: slow speed, cues for sequence and RW position from self  Stairs            Wheelchair Mobility    Modified Rankin (Stroke Patients Only)       Balance Overall balance assessment: Needs assistance   Sitting balance-Leahy Scale: Fair     Standing balance support: Bilateral upper extremity supported Standing balance-Leahy Scale: Poor                               Pertinent  Vitals/Pain Pain Assessment: 0-10 Pain Score: 5  Pain Location: L knee Pain Descriptors / Indicators: Sore Pain Intervention(s): Limited activity within patient's tolerance;Monitored during session;Premedicated before session;Repositioned    Home Living Family/patient expects to be discharged to:: Private residence Living Arrangements: Spouse/significant other Available Help at Discharge: Available PRN/intermittently Type of Home: House Home Access: Stairs to enter Entrance Stairs-Rails: None Entrance Stairs-Number of Steps: 1 Home Layout: One level Home Equipment: None Additional Comments: pt works as Arts development officer, 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 as Engineer, manufacturing systems Level of Independence: Independent               Journalist, newspaper        Extremity/Trunk Assessment   Upper Extremity Assessment: Defer to OT evaluation;Overall WFL for tasks assessed           Lower Extremity Assessment: LLE deficits/detail         Communication   Communication: No difficulties  Cognition Arousal/Alertness: Awake/alert Behavior During Therapy: WFL for tasks assessed/performed Overall Cognitive Status: Within Functional Limits for tasks assessed                      General Comments      Exercises Total Joint Exercises Ankle Circles/Pumps: AROM;Both;10 reps Quad Sets: 10 reps;Both;AROM;Strengthening      Assessment/Plan    PT Assessment Patient needs continued PT services  PT Diagnosis Difficulty walking   PT Problem List Decreased range  of motion;Decreased strength;Decreased activity tolerance;Decreased mobility;Decreased knowledge of use of DME  PT Treatment Interventions DME instruction;Gait training;Functional mobility training;Therapeutic activities;Patient/family education;Therapeutic exercise;Stair training   PT Goals (Current goals can be found in the Care Plan section) Acute Rehab PT Goals Patient Stated Goal: back to work, knee stronger PT Goal  Formulation: With patient Time For Goal Achievement: 10/05/15 Potential to Achieve Goals: Good    Frequency 7X/week   Barriers to discharge        Co-evaluation               End of Session Equipment Utilized During Treatment: Gait belt Activity Tolerance: Patient tolerated treatment well Patient left: in chair;with call bell/phone within reach;with chair alarm set           Time: 1530-1550 PT Time Calculation (min) (ACUTE ONLY): 20 min   Charges:   PT Evaluation $PT Eval Low Complexity: 1 Procedure     PT G Codes:        Heidi Adams 2015/10/27, 3:51 PM

## 2015-09-28 NOTE — Interval H&P Note (Signed)
History and Physical Interval Note:  09/28/2015 7:10 AM  Heidi Adams  has presented today for surgery, with the diagnosis of left knee osteoarthritis  The various methods of treatment have been discussed with the patient and family. After consideration of risks, benefits and other options for treatment, the patient has consented to  Procedure(s): LEFT TOTAL KNEE ARTHROPLASTY (Left) as a surgical intervention .  The patient's history has been reviewed, patient examined, no change in status, stable for surgery.  I have reviewed the patient's chart and labs.  Questions were answered to the patient's satisfaction.     [name]

## 2015-09-28 NOTE — Progress Notes (Signed)
Utilization review completed.  

## 2015-09-28 NOTE — Anesthesia Preprocedure Evaluation (Signed)
Anesthesia Evaluation  Patient identified by MRN, date of birth, ID band Patient awake    Reviewed: Allergy & Precautions, NPO status , Patient's Chart, lab work & pertinent test results  History of Anesthesia Complications Negative for: history of anesthetic complications  Airway Mallampati: II  TM Distance: >3 FB Neck ROM: Full    Dental  (+) Teeth Intact   Pulmonary neg shortness of breath, neg sleep apnea, neg COPD, neg recent URI, former smoker,    breath sounds clear to auscultation       Cardiovascular hypertension, Pt. on medications  Rhythm:Regular     Neuro/Psych negative neurological ROS  negative psych ROS   GI/Hepatic Neg liver ROS,   Endo/Other  Hypothyroidism   Renal/GU negative Renal ROS     Musculoskeletal  (+) Arthritis ,   Abdominal   Peds  Hematology negative hematology ROS (+)   Anesthesia Other Findings   Reproductive/Obstetrics                             Anesthesia Physical Anesthesia Plan  ASA: II  Anesthesia Plan: Spinal   Post-op Pain Management:    Induction: Intravenous  Airway Management Planned: Nasal Cannula, Simple Face Mask and Natural Airway  Additional Equipment: None  Intra-op Plan:   Post-operative Plan:   Informed Consent: I have reviewed the patients History and Physical, chart, labs and discussed the procedure including the risks, benefits and alternatives for the proposed anesthesia with the patient or authorized representative who has indicated his/her understanding and acceptance.   Dental advisory given  Plan Discussed with: CRNA and Surgeon  Anesthesia Plan Comments:         Anesthesia Quick Evaluation

## 2015-09-28 NOTE — Anesthesia Procedure Notes (Addendum)
Spinal Patient location during procedure: OR Staffing Anesthesiologist: MOSER, CHRISTOPHER Preanesthetic Checklist Completed: patient identified, surgical consent, pre-op evaluation, timeout performed, IV checked, risks and benefits discussed and monitors and equipment checked Spinal Block Patient position: sitting Prep: site prepped and draped and DuraPrep Patient monitoring: heart rate, cardiac monitor, continuous pulse ox and blood pressure Approach: midline Location: L4-5 Injection technique: single-shot Needle Needle type: Quincke  Needle gauge: 22 G Needle length: 10 cm Assessment Sensory level: T8 Additional Notes Patient sitting prepped and draped by CRNA, CRNA with multiple attempts without CSF return. Moser with attempt x1. Positive CSF on aspiration. No paraesthesia.   Procedure Name: LMA Insertion Date/Time: 09/28/2015 8:01 AM Performed by: Danley Danker L Patient Re-evaluated:Patient Re-evaluated prior to inductionOxygen Delivery Method: Circle system utilized Preoxygenation: Pre-oxygenation with 100% oxygen Intubation Type: IV induction LMA: LMA inserted LMA Size: 4.0 Number of attempts: 1 Placement Confirmation: breath sounds checked- equal and bilateral and positive ETCO2 Tube secured with: Tape Dental Injury: Teeth and Oropharynx as per pre-operative assessment

## 2015-09-28 NOTE — Anesthesia Postprocedure Evaluation (Addendum)
Anesthesia Post Note  Patient: Heidi Adams  Procedure(s) Performed: Procedure(s) (LRB): LEFT TOTAL KNEE ARTHROPLASTY (Left)  Patient location during evaluation: PACU Anesthesia Type: Spinal and General Level of consciousness: awake Pain management: pain level controlled Vital Signs Assessment: post-procedure vital signs reviewed and stable Respiratory status: spontaneous breathing and respiratory function stable Cardiovascular status: stable Postop Assessment: spinal receding and no signs of nausea or vomiting Anesthetic complications: no    Last Vitals:  Filed Vitals:   09/28/15 1309 09/28/15 1359  BP: 116/55 126/57  Pulse: 75 77  Temp: 36.6 C 36.7 C  Resp: 15 14    Last Pain:  Filed Vitals:   09/28/15 1700  PainSc: 9                  Alyscia Carmon

## 2015-09-28 NOTE — Addendum Note (Signed)
Addendum  created 09/28/15 1801 by Oleta Mouse, MD   Modules edited: Clinical Notes   Clinical Notes:  File: [id number]

## 2015-09-29 LAB — BASIC METABOLIC PANEL
ANION GAP: 6 (ref 5–15)
BUN: 28 mg/dL — AB (ref 6–20)
CHLORIDE: 104 mmol/L (ref 101–111)
CO2: 29 mmol/L (ref 22–32)
CREATININE: 0.81 mg/dL (ref 0.44–1.00)
Calcium: 8.8 mg/dL — ABNORMAL LOW (ref 8.9–10.3)
GFR calc non Af Amer: >60 mL/min (ref >60)
Glucose, Bld: 109 mg/dL — ABNORMAL HIGH (ref 65–99)
Potassium: 4.2 mmol/L (ref 3.5–5.1)
Sodium: 139 mmol/L (ref 135–145)

## 2015-09-29 LAB — CBC
HCT: 39.5 % (ref 36.0–46.0)
Hemoglobin: 12.2 g/dL (ref 12.0–15.0)
MCH: 30.3 pg (ref 26.0–34.0)
MCHC: 30.9 g/dL (ref 30.0–36.0)
MCV: 98.3 fL (ref 78.0–100.0)
PLATELETS: 197 10*3/uL (ref 150–400)
RBC: 4.02 MIL/uL (ref 3.87–5.11)
RDW: 13.1 % (ref 11.5–15.5)
WBC: 12.8 10*3/uL — AB (ref 4.0–10.5)

## 2015-09-29 MED ORDER — PANTOPRAZOLE SODIUM 40 MG PO TBEC
40.0000 mg | DELAYED_RELEASE_TABLET | Freq: Every day | ORAL | Status: DC
  Administered 2015-09-29 – 2015-09-30 (×2): 40 mg via ORAL
  Filled 2015-09-29 (×2): qty 1

## 2015-09-29 MED ORDER — KETOROLAC TROMETHAMINE 15 MG/ML IJ SOLN
7.5000 mg | Freq: Four times a day (QID) | INTRAMUSCULAR | Status: DC
  Administered 2015-09-29 – 2015-09-30 (×4): 7.5 mg via INTRAVENOUS
  Filled 2015-09-29 (×7): qty 1

## 2015-09-29 NOTE — Progress Notes (Signed)
Physical Therapy Treatment Patient Details Name: Heidi Adams MRN: [id number] DOB: April 01, 1936 Today's Date: 09/29/2015    History of Present Illness [name]    PT Comments    Progressing slowly, pain incr with activity, pt was premedicated, she is also dizzy from her meds  Follow Up Recommendations  Home health PT;Supervision for mobility/OOB     Equipment Recommendations  Rolling walker with 5" wheels    Recommendations for Other Services       Precautions / Restrictions Precautions Precautions: Knee Restrictions Weight Bearing Restrictions: No Other Position/Activity Restrictions: WBAT    Mobility  Bed Mobility Overal bed mobility: Needs Assistance Bed Mobility: Supine to Sit     Supine to sit: Min assist Sit to supine: Min assist   General bed mobility comments: assist for LLE, incr time  Transfers Overall transfer level: Needs assistance Equipment used: Rolling walker (2 wheeled) Transfers: Sit to/from Stand Sit to Stand: Min assist         General transfer comment: assist to rise and stabilize.  Cues for UE/LE placement  Ambulation/Gait Ambulation/Gait assistance: Min assist Ambulation Distance (Feet): 20 Feet Assistive device: Rolling walker (2 wheeled) Gait Pattern/deviations: Step-to pattern Gait velocity: decr   General Gait Details: slow speed, cues for sequence and RW position from self (pt continues to be dizzy d/t meds)   Stairs            Wheelchair Mobility    Modified Rankin (Stroke Patients Only)       Balance           Standing balance support: Bilateral upper extremity supported;During functional activity Standing balance-Leahy Scale: Poor                      Cognition Arousal/Alertness: Awake/alert Behavior During Therapy: WFL for tasks assessed/performed Overall Cognitive Status: Impaired/Different from baseline Area of Impairment: Memory     Memory: Decreased short-term memory         General  Comments: likely med related STM loss    Exercises Total Joint Exercises Ankle Circles/Pumps: AROM;Both;10 reps Quad Sets: 10 reps;Both;AROM;Strengthening Heel Slides: AAROM;Left;10 reps;Supine    General Comments        Pertinent Vitals/Pain Pain Assessment: 0-10 Pain Score: 9  Pain Location: L knee with activity Pain Descriptors / Indicators: Sore;Aching Pain Intervention(s): Limited activity within patient's tolerance;Monitored during session;Premedicated before session;Repositioned    Home Living Family/patient expects to be discharged to:: Private residence Living Arrangements: Spouse/significant other Available Help at Discharge: Available PRN/intermittently         Home Equipment: None      Prior Function Level of Independence: Independent      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   PT Goals (current goals can now be found in the care plan section) Acute Rehab PT Goals Patient Stated Goal: back to work, knee stronger PT Goal Formulation: With patient Time For Goal Achievement: 10/05/15 Potential to Achieve Goals: Good Progress towards PT goals: Progressing toward goals    Frequency  7X/week    PT Plan Current plan remains appropriate    Co-evaluation             End of Session Equipment Utilized During Treatment: Gait belt Activity Tolerance: Patient tolerated treatment well Patient left: in chair;with call bell/phone within reach;with chair alarm set     Time: [id number] PT Time Calculation (min) (ACUTE ONLY): 18 min  Charges:  $Gait Training: 8-22 mins  G CodesKenyon Ana 09/29/2015, 12:38 PM

## 2015-09-29 NOTE — Care Management Note (Signed)
Case Management Note  Patient Details  Name: Heidi Adams MRN: [id number] Date of Birth: May 10, 1936  Subjective/Objective:                  LEFT TOTAL KNEE ARTHROPLASTY (Left) Action/Plan: Discharge planning Expected Discharge Date:  09/30/15               Expected Discharge Plan:  [place]  In-House Referral:     Discharge planning Services  CM Consult  Post Acute Care Choice:    Choice offered to:  Patient  DME Arranged:  3-N-1, Walker rolling DME Agency:     HH Arranged:  PT HH Agency:  [place]  Status of Service:  Completed, signed off  Medicare Important Message Given:    Date Medicare IM Given:    Medicare IM give by:    Date Additional Medicare IM Given:    Additional Medicare Important Message give by:     If discussed at [place] of Stay Meetings, dates discussed:    Additional Comments: CM met with pt in room to offer choice of home health agency.  Pt chooses [facility] to render HHPT.  Referral given to [employer], Tim.  CM called [facility] DME rep, Lecretia to please deliver the rolling walker and 3n1 to room prior to discharge.  No other CM needs were communicated. Dellie Catholic, RN 09/29/2015, 9:32 AM

## 2015-09-29 NOTE — Progress Notes (Signed)
Physical Therapy Treatment Patient Details Name: Heidi Adams MRN: [id number] DOB: 18-Dec-1935 Today's Date: 09/29/2015    History of Present Illness [name]    PT Comments    POD # 1 pm session. Assisted out of recliner to amb a second time.  Pt required increased time with mild c/o nausea.  Assisted back to bed. Pt plans to D/C back home with assist from spouse.   Follow Up Recommendations  Home health PT;Supervision for mobility/OOB     Equipment Recommendations  Rolling walker with 5" wheels    Recommendations for Other Services       Precautions / Restrictions Precautions Precautions: Knee Precaution Comments: instructed on NO pillow under knee Restrictions Weight Bearing Restrictions: No Other Position/Activity Restrictions: WBAT    Mobility  Bed Mobility Overal bed mobility: Needs Assistance Bed Mobility: Sit to Supine     Supine to sit: Min assist Sit to supine: Min assist   General bed mobility comments: assist for LLE, incr time  Transfers Overall transfer level: Needs assistance Equipment used: Rolling walker (2 wheeled) Transfers: Sit to/from Stand Sit to Stand: Min assist         General transfer comment: assist to rise and stabilize.  Cues for UE/LE placement plus increased time  Ambulation/Gait Ambulation/Gait assistance: Min guard;Min assist Ambulation Distance (Feet): 22 Feet Assistive device: Rolling walker (2 wheeled) Gait Pattern/deviations: Step-to pattern;Decreased step length - left Gait velocity: decr   General Gait Details: slow speed, cues for sequence and RW position from self   Stairs            Wheelchair Mobility    Modified Rankin (Stroke Patients Only)       Balance           Standing balance support: Bilateral upper extremity supported;During functional activity Standing balance-Leahy Scale: Poor                      Cognition Arousal/Alertness: Awake/alert Behavior During Therapy: WFL for  tasks assessed/performed Overall Cognitive Status: Within Functional Limits for tasks assessed Area of Impairment: Memory     Memory: Decreased short-term memory         General Comments: likely med related STM loss    Exercises Total Joint Exercises Ankle Circles/Pumps: AROM;Both;10 reps Quad Sets: 10 reps;Both;AROM;Strengthening Heel Slides: AAROM;Left;10 reps;Supine    General Comments        Pertinent Vitals/Pain Pain Assessment: 0-10 Pain Score: 8  Pain Location: L knee with activity Pain Descriptors / Indicators: Tightness Pain Intervention(s): Monitored during session;Premedicated before session;Repositioned;Ice applied    Home Living Family/patient expects to be discharged to:: Private residence Living Arrangements: Spouse/significant other Available Help at Discharge: Available PRN/intermittently         Home Equipment: None      Prior Function Level of Independence: Independent      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   PT Goals (current goals can now be found in the care plan section) Acute Rehab PT Goals Patient Stated Goal: back to work, knee stronger PT Goal Formulation: With patient Time For Goal Achievement: 10/05/15 Potential to Achieve Goals: Good Progress towards PT goals: Progressing toward goals    Frequency  7X/week    PT Plan Current plan remains appropriate    Co-evaluation             End of Session Equipment Utilized During Treatment: Gait belt Activity Tolerance: Patient tolerated treatment well Patient left: in bed;with  call bell/phone within reach;with bed alarm set     Time: 1310-1335 PT Time Calculation (min) (ACUTE ONLY): 25 min  Charges:  $Gait Training: 8-22 mins $Therapeutic Activity: 8-22 mins                    G Codes:      Rica Koyanagi  PTA [facility]  Acute  Rehab Pager      [phone]

## 2015-09-29 NOTE — Evaluation (Signed)
Occupational Therapy Evaluation Patient Details Name: Heidi Adams MRN: [id number] DOB: [birth year] Today's Date: 09/29/2015    History of Present Illness [name]   Clinical Impression   This 80 year old female was admitted for the above surgery. At time of evaluation, she was limited by pain and fatique. Will follow in acute setting with min A level goals.    Follow Up Recommendations  Supervision/Assistance - 24 hour    Equipment Recommendations  3 in 1 bedside comode    Recommendations for Other Services       Precautions / Restrictions Precautions Precautions: Knee Restrictions Weight Bearing Restrictions: No Other Position/Activity Restrictions: WBAT      Mobility Bed Mobility           Sit to supine: Min assist   General bed mobility comments: assist for LLE  Transfers   Equipment used: Rolling walker (2 wheeled) Transfers: Sit to/from Stand Sit to Stand: Min assist         General transfer comment: assist to rise and stabilize.  Cues for UE/LE placement    Balance                                            ADL Overall ADL's : Needs assistance/impaired     Grooming: Set up;Sitting   Upper Body Bathing: Set up;Sitting   Lower Body Bathing: Minimal assistance;Sit to/from stand   Upper Body Dressing : Set up;Sitting   Lower Body Dressing: Moderate assistance;Sit to/from stand                 General ADL Comments: pt sidestepped up [facility] with increased pain (8/10) and nausea.  She tends to move good leg, heel/toe to step over.  Explained AE--pt states husband can assist her at home     Vision     Perception     Praxis      Pertinent Vitals/Pain Pain Assessment: 0-10 Pain Score: 8  Pain Location: L knee with movement Pain Descriptors / Indicators: Aching Pain Intervention(s): Limited activity within patient's tolerance;Monitored during session;Premedicated before session;Repositioned     Hand Dominance      Extremity/Trunk Assessment Upper Extremity Assessment Upper Extremity Assessment: Overall WFL for tasks assessed (has tendon problems, R > L:  OK for ADLs)           Communication Communication Communication: No difficulties   Cognition Arousal/Alertness: Awake/alert Behavior During Therapy: WFL for tasks assessed/performed Overall Cognitive Status: Impaired/Different from baseline Area of Impairment: Memory     Memory: Decreased short-term memory         General Comments: likely medication related--not remembering things she asked (epidural)   General Comments       Exercises       Shoulder Instructions      Home Living Family/patient expects to be discharged to:: Private residence Living Arrangements: Spouse/significant other Available Help at Discharge: Available PRN/intermittently               Bathroom Shower/Tub: Walk-in Psychologist, prison and probation services: Standard     Home Equipment: None          Prior Functioning/Environment Level of Independence: Independent        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    OT Diagnosis: Generalized weakness;Acute pain   OT Problem List: Decreased strength;Decreased activity tolerance;Decreased knowledge of use  of DME or AE;Pain   OT Treatment/Interventions: Self-care/ADL training;DME and/or AE instruction;Patient/family education (memory likely medication related)    OT Goals(Current goals can be found in the care plan section) Acute Rehab OT Goals Patient Stated Goal: back to work, knee stronger OT Goal Formulation: With patient Time For Goal Achievement: 10/06/15 Potential to Achieve Goals: Good ADL Goals Pt Will Perform Grooming: with supervision;standing Pt Will Transfer to Toilet: with min guard assist;ambulating;bedside commode Pt Will Perform Tub/Shower Transfer: Shower transfer;with min guard assist;3 in 1;ambulating (vs verbalizing sequence, if not ready) Additional ADL Goal #1: pt will  verbalize use of AE for ADLs, if desired  OT Frequency: Min 2X/week   Barriers to D/C:            Co-evaluation              End of Session    Activity Tolerance: Patient limited by fatigue;Patient limited by pain Patient left: in bed;with call bell/phone within reach;with bed alarm set   Time: [id number] OT Time Calculation (min): 22 min Charges:  OT General Charges $OT Visit: 1 Procedure OT Evaluation $OT Eval Low Complexity: 1 Procedure G-Codes:    Arley Salamone 10-18-2015, 10:25 AM Lesle Chris, OTR/L [phone] 2015-10-18

## 2015-09-29 NOTE — Progress Notes (Signed)
Patient ID: Heidi Adams, female   DOB: 08-01-1935, 79 y.o.   MRN: [id number] Subjective: 1 Day Post-Op Procedure(s) (LRB): LEFT TOTAL KNEE ARTHROPLASTY (Left)    Patient reports pain as moderate.  Doing ok.  Significant history of urinary frequency, incontinence  Objective:   VITALS:   Filed Vitals:   09/29/15 0901 09/29/15 1250  BP: 119/51 127/79  Pulse: 66 83  Temp: 98.5 F (36.9 C) 98 F (36.7 C)  Resp: 16 15    Neurovascular intact Incision: dressing C/D/I  LABS  Recent Labs  09/29/15 0338  HGB 12.2  HCT 39.5  WBC 12.8*  PLT 197     Recent Labs  09/29/15 0338  NA 139  K 4.2  BUN 28*  CREATININE 0.81  GLUCOSE 109*    No results for input(s): LABPT, INR in the last 72 hours.   Assessment/Plan: 1 Day Post-Op Procedure(s) (LRB): LEFT TOTAL KNEE ARTHROPLASTY (Left)   Advance diet Up with therapy Continue foley due to urinary output monitoring and decrease mobility post-operatively  Plan for discharge tomorrow Foley out in am

## 2015-09-29 NOTE — Discharge Instructions (Addendum)
Information on my medicine - XARELTO® (Rivaroxaban) ° °This medication education was reviewed with me or my healthcare representative as part of my discharge preparation.  °Why was Xarelto® prescribed for you? °Xarelto® was prescribed for you to reduce the risk of blood clots forming after orthopedic surgery. The medical term for these abnormal blood clots is venous thromboembolism (VTE). ° °What do you need to know about xarelto® ? °Take your Xarelto® ONCE DAILY at the same time every day. °You may take it either with or without food. ° °If you have difficulty swallowing the tablet whole, you may crush it and mix in applesauce just prior to taking your dose. ° °Take Xarelto® exactly as prescribed by your doctor and DO NOT stop taking Xarelto® without talking to the doctor who prescribed the medication.  Stopping without other VTE prevention medication to take the place of Xarelto® may increase your risk of developing a clot. ° °After discharge, you should have regular check-up appointments with your healthcare provider that is prescribing your Xarelto®.   ° °What do you do if you miss a dose? °If you miss a dose, take it as soon as you remember on the same day then continue your regularly scheduled once daily regimen the next day. Do not take two doses of Xarelto® on the same day.  ° °Important Safety Information °A possible side effect of Xarelto® is bleeding. You should call your healthcare provider right away if you experience any of the following: °? Bleeding from an injury or your nose that does not stop. °? Unusual colored urine (red or dark brown) or unusual colored stools (red or black). °? Unusual bruising for unknown reasons. °? A serious fall or if you hit your head (even if there is no bleeding). ° °Some medicines may interact with Xarelto® and might increase your risk of bleeding while on Xarelto®. To help avoid this, consult your healthcare provider or pharmacist prior to using any new prescription or  non-prescription medications, including herbals, vitamins, non-steroidal anti-inflammatory drugs (NSAIDs) and supplements. ° °This website has more information on Xarelto®: www.xarelto.com. ° °INSTRUCTIONS AFTER JOINT REPLACEMENT  ° °o Remove items at home which could result in a fall. This includes throw rugs or furniture in walking pathways °o ICE to the affected joint every three hours while awake for 30 minutes at a time, for at least the first 3-5 days, and then as needed for pain and swelling.  Continue to use ice for pain and swelling. You may notice swelling that will progress down to the foot and ankle.  This is normal after surgery.  Elevate your leg when you are not up walking on it.   °o Continue to use the breathing machine you got in the hospital (incentive spirometer) which will help keep your temperature down.  It is common for your temperature to cycle up and down following surgery, especially at night when you are not up moving around and exerting yourself.  The breathing machine keeps your lungs expanded and your temperature down. ° ° °DIET:  As you were doing prior to hospitalization, we recommend a well-balanced diet. ° °DRESSING / WOUND CARE / SHOWERING ° °Keep the surgical dressing until follow up.  The dressing is water proof, so you can shower without any extra covering.  IF THE DRESSING FALLS OFF or the wound gets wet inside, change the dressing with sterile gauze.  Please use good hand washing techniques before changing the dressing.  Do not use any lotions or   creams on the incision until instructed by your surgeon.   ° °ACTIVITY ° °o Increase activity slowly as tolerated, but follow the weight bearing instructions below.   °o No driving for 6 weeks or until further direction given by your physician.  You cannot drive while taking narcotics.  °o No lifting or carrying greater than 10 lbs. until further directed by your surgeon. °o Avoid periods of inactivity such as sitting longer than an  hour when not asleep. This helps prevent blood clots.  °o You may return to work once you are authorized by your doctor.  ° ° ° °WEIGHT BEARING  ° °Weight bearing as tolerated with assist device (walker, cane, etc) as directed, use it as long as suggested by your surgeon or therapist, typically at least 4-6 weeks. ° ° °EXERCISES ° °Results after joint replacement surgery are often greatly improved when you follow the exercise, range of motion and muscle strengthening exercises prescribed by your doctor. Safety measures are also important to protect the joint from further injury. Any time any of these exercises cause you to have increased pain or swelling, decrease what you are doing until you are comfortable again and then slowly increase them. If you have problems or questions, call your caregiver or physical therapist for advice.  ° °Rehabilitation is important following a joint replacement. After just a few days of immobilization, the muscles of the leg can become weakened and shrink (atrophy).  These exercises are designed to build up the tone and strength of the thigh and leg muscles and to improve motion. Often times heat used for twenty to thirty minutes before working out will loosen up your tissues and help with improving the range of motion but do not use heat for the first two weeks following surgery (sometimes heat can increase post-operative swelling).  ° °These exercises can be done on a training (exercise) mat, on the floor, on a table or on a bed. Use whatever works the best and is most comfortable for you.    Use music or television while you are exercising so that the exercises are a pleasant break in your day. This will make your life better with the exercises acting as a break in your routine that you can look forward to.   Perform all exercises about fifteen times, three times per day or as directed.  You should exercise both the operative leg and the other leg as well. ° °Exercises include: °    °• Quad Sets - Tighten up the muscle on the front of the thigh (Quad) and hold for 5-10 seconds.   °• Straight Leg Raises - With your knee straight (if you were given a brace, keep it on), lift the leg to 60 degrees, hold for 3 seconds, and slowly lower the leg.  Perform this exercise against resistance later as your leg gets stronger.  °• Leg Slides: Lying on your back, slowly slide your foot toward your buttocks, bending your knee up off the floor (only go as far as is comfortable). Then slowly slide your foot back down until your leg is flat on the floor again.  °• [place] Wings: Lying on your back spread your legs to the side as far apart as you can without causing discomfort.  °• Hamstring Strength:  Lying on your back, push your heel against the floor with your leg straight by tightening up the muscles of your buttocks.  Repeat, but this time bend your knee to a comfortable angle,   and push your heel against the floor.  You may put a pillow under the heel to make it more comfortable if necessary.  ° °A rehabilitation program following joint replacement surgery can speed recovery and prevent re-injury in the future due to weakened muscles. Contact your doctor or a physical therapist for more information on knee rehabilitation.  ° ° °CONSTIPATION ° °Constipation is defined medically as fewer than three stools per week and severe constipation as less than one stool per week.  Even if you have a regular bowel pattern at home, your normal regimen is likely to be disrupted due to multiple reasons following surgery.  Combination of anesthesia, postoperative narcotics, change in appetite and fluid intake all can affect your bowels.  ° °YOU MUST use at least one of the following options; they are listed in order of increasing strength to get the job done.  They are all available over the counter, and you may need to use some, POSSIBLY even all of these options:   ° °Drink plenty of fluids (prune juice may be helpful) and  high fiber foods °Colace 100 mg by mouth twice a day  °Senokot for constipation as directed and as needed Dulcolax (bisacodyl), take with full glass of water  °Miralax (polyethylene glycol) once or twice a day as needed. ° °If you have tried all these things and are unable to have a bowel movement in the first 3-4 days after surgery call either your surgeon or your primary doctor.   ° °If you experience loose stools or diarrhea, hold the medications until you stool forms back up.  If your symptoms do not get better within 1 week or if they get worse, check with your doctor.  If you experience "the worst abdominal pain ever" or develop nausea or vomiting, please contact the office immediately for further recommendations for treatment. ° ° °ITCHING:  If you experience itching with your medications, try taking only a single pain pill, or even half a pain pill at a time.  You can also use Benadryl over the counter for itching or also to help with sleep.  ° °TED HOSE STOCKINGS:  Use stockings on both legs until for at least 2 weeks or as directed by physician office. They may be removed at night for sleeping. ° °MEDICATIONS:  See your medication summary on the “After Visit Summary” that nursing will review with you.  You may have some home medications which will be placed on hold until you complete the course of blood thinner medication.  It is important for you to complete the blood thinner medication as prescribed. ° °PRECAUTIONS:  If you experience chest pain or shortness of breath - call 911 immediately for transfer to the hospital emergency department.  ° °If you develop a fever greater that 101 F, purulent drainage from wound, increased redness or drainage from wound, foul odor from the wound/dressing, or calf pain - CONTACT YOUR SURGEON.   °                                                °FOLLOW-UP APPOINTMENTS:  If you do not already have a post-op appointment, please call the office for an appointment to be seen  by your surgeon.  Guidelines for how soon to be seen are listed in your “After Visit Summary”, but are typically between 1-4   weeks after surgery. ° °OTHER INSTRUCTIONS:  ° °Knee Replacement:  Do not place pillow under knee, focus on keeping the knee straight while resting.  ° °MAKE SURE YOU:  °• Understand these instructions.  °• Get help right away if you are not doing well or get worse.  ° ° °Thank you for letting us be a part of your medical care team.  It is a privilege we respect greatly.  We hope these instructions will help you stay on track for a fast and full recovery!  °  °

## 2015-09-30 DIAGNOSIS — M179 Osteoarthritis of knee, unspecified: Secondary | ICD-10-CM | POA: Diagnosis not present

## 2015-09-30 LAB — CBC
HCT: 34.4 % — ABNORMAL LOW (ref 36.0–46.0)
Hemoglobin: 11.1 g/dL — ABNORMAL LOW (ref 12.0–15.0)
MCH: 31 pg (ref 26.0–34.0)
MCHC: 32.3 g/dL (ref 30.0–36.0)
MCV: 96.1 fL (ref 78.0–100.0)
PLATELETS: 159 10*3/uL (ref 150–400)
RBC: 3.58 MIL/uL — ABNORMAL LOW (ref 3.87–5.11)
RDW: 12.9 % (ref 11.5–15.5)
WBC: 10 10*3/uL (ref 4.0–10.5)

## 2015-09-30 LAB — BASIC METABOLIC PANEL
ANION GAP: 5 (ref 5–15)
BUN: 22 mg/dL — ABNORMAL HIGH (ref 6–20)
CALCIUM: 8.5 mg/dL — AB (ref 8.9–10.3)
CO2: 29 mmol/L (ref 22–32)
Chloride: 103 mmol/L (ref 101–111)
Creatinine, Ser: 0.7 mg/dL (ref 0.44–1.00)
GLUCOSE: 98 mg/dL (ref 65–99)
Potassium: 3.8 mmol/L (ref 3.5–5.1)
Sodium: 137 mmol/L (ref 135–145)

## 2015-09-30 MED ORDER — RIVAROXABAN 10 MG PO TABS: 10.0000 mg | ORAL_TABLET | ORAL | Status: DC

## 2015-09-30 MED ORDER — OXYCODONE HCL 5 MG PO TABS: 5.0000 mg | ORAL_TABLET | ORAL | Status: DC | PRN

## 2015-09-30 MED ORDER — DOCUSATE SODIUM 100 MG PO CAPS: 100.0000 mg | ORAL_CAPSULE | Freq: Two times a day (BID) | ORAL | Status: DC

## 2015-09-30 MED ORDER — TIZANIDINE HCL 4 MG PO TABS: 4.0000 mg | ORAL_TABLET | Freq: Four times a day (QID) | ORAL | Status: DC | PRN

## 2015-09-30 MED ORDER — POLYETHYLENE GLYCOL 3350 17 G PO PACK: 17.0000 g | PACK | Freq: Two times a day (BID) | ORAL | Status: DC

## 2015-09-30 MED ORDER — FERROUS SULFATE 325 (65 FE) MG PO TABS: 325.0000 mg | ORAL_TABLET | Freq: Three times a day (TID) | ORAL | Status: DC

## 2015-09-30 NOTE — Progress Notes (Signed)
Occupational Therapy Treatment Patient Details Name: DANAIJAH FARLER MRN: [id number] DOB: 09/17/1935 Today's Date: 09/30/2015    History of present illness [name]   OT comments  Pt limited by nausea when walking but tolerated walking to bathroom.  Follow Up Recommendations  Supervision/Assistance - 24 hour    Equipment Recommendations  3 in 1 bedside comode    Recommendations for Other Services      Precautions / Restrictions Precautions Precautions: Knee Restrictions Weight Bearing Restrictions: No       Mobility Bed Mobility         Supine to sit: Min assist Sit to supine: Min assist   General bed mobility comments: assist for LLE  Transfers   Equipment used: Rolling walker (2 wheeled) Transfers: Sit to/from Stand Sit to Stand: Min guard         General transfer comment: for safety.  Cues for UE placement    Balance                                   ADL Overall ADL's : Needs assistance/impaired                         Toilet Transfer: Min guard;Ambulation;BSC;RW             General ADL Comments: ambulated to bathroom and used commode.  Demonstrated shower transfer, but she did not want to practice today. Pt reports that she gets nauseous whenever she walks. Returned to bed:  did not want anti-nausea medication, ginger ale or cold wash cloth.  Informed RN.  Pt did not want to try leg lifter/AE      Vision                     Perception     Praxis      Cognition   Behavior During Therapy: [facility] for tasks assessed/performed Overall Cognitive Status: Within Functional Limits for tasks assessed                       Extremity/Trunk Assessment               Exercises     Shoulder Instructions       General Comments      Pertinent Vitals/ Pain       Pain Score: 6  Pain Location: L knee Pain Descriptors / Indicators: Aching Pain Intervention(s): Limited activity within patient's  tolerance;Monitored during session;Premedicated before session;Repositioned  Home Living                                          Prior Functioning/Environment              Frequency Min 2X/week     Progress Toward Goals  OT Goals(current goals can now be found in the care plan section)  Progress towards OT goals: Progressing toward goals     Plan      Co-evaluation                 End of Session     Activity Tolerance Patient limited by pain (and nausea)   Patient Left in bed;with call bell/phone within reach;with bed alarm set   Nurse Communication  Time: [id number] OT Time Calculation (min): 15 min  Charges: OT General Charges $OT Visit: 1 Procedure OT Treatments $Self Care/Home Management : 8-22 mins  Tereka Thorley 09/30/2015, 12:06 PM  Lesle Chris, OTR/L [phone] 09/30/2015

## 2015-09-30 NOTE — Progress Notes (Signed)
Patient reports feeling nauseous after working with OT. She received oxycodone 10mg  this morning but had not eaten breakfast per her choice. Encouraged patient to order lunch and take oxycodone afterwards. Refuses Zofran or any other anti-emetic. Will continue to monitor.

## 2015-09-30 NOTE — Progress Notes (Signed)
     Subjective: 2 Days Post-Op Procedure(s) (LRB): LEFT TOTAL KNEE ARTHROPLASTY (Left)   Seen by Dr. Alvan Dame. Patient reports pain as mild, pain controlled.  No events throughout the night. Ready to be discharged home if she does well with PT and pain stays controlled.   Objective:   VITALS:   Filed Vitals:   09/29/15 2235 09/30/15 0510  BP: 134/73 167/69  Pulse: 71 83  Temp: 98.6 F (37 C) 98.6 F (37 C)  Resp: 15 16    Dorsiflexion/Plantar flexion intact Incision: dressing C/D/I No cellulitis present Compartment soft  LABS  Recent Labs  09/29/15 0338 09/30/15 0357  HGB 12.2 11.1*  HCT 39.5 34.4*  WBC 12.8* 10.0  PLT 197 159     Recent Labs  09/29/15 0338 09/30/15 0357  NA 139 137  K 4.2 3.8  BUN 28* 22*  CREATININE 0.81 0.70  GLUCOSE 109* 98     Assessment/Plan: 2 Days Post-Op Procedure(s) (LRB): LEFT TOTAL KNEE ARTHROPLASTY (Left) Up with therapy Discharge home with home health  Follow up in 2 weeks at [facility] [place]. Follow up with OLIN,Kemuel Buchmann D in 2 weeks.  Contact information:  [facility] [address], [place] [id number]    Obese (BMI 30-39.9) Estimated body mass index is 30.37 kg/(m^2) as calculated from the following:   Height as of this encounter: 5\' 4"  (1.626 m).   Weight as of this encounter: 80.287 kg (177 lb). Patient also counseled that weight may inhibit the healing process Patient counseled that losing weight will help with future health issues        [place] Pugh. Arrin Pintor   PAC  09/30/2015, 9:04 AM

## 2015-09-30 NOTE — Progress Notes (Signed)
Physical Therapy Treatment Patient Details Name: Heidi Adams MRN: [id number] DOB: [birth year] Today's Date: 09/30/2015    History of Present Illness [name]    PT Comments    POD # 2 am session limited by 10/10 knee pain and nausea.  Pt not feeling as well as yesterday.  Only able to tolerate OOB to bathroom then back to bed.  Will return later to perform stair training.  Follow Up Recommendations  Home health PT;Supervision for mobility/OOB     Equipment Recommendations  Rolling walker with 5" wheels    Recommendations for Other Services       Precautions / Restrictions Precautions Precautions: Knee Precaution Comments: instructed on NO pillow under knee Restrictions Weight Bearing Restrictions: No Other Position/Activity Restrictions: WBAT    Mobility  Bed Mobility Overal bed mobility: Needs Assistance Bed Mobility: Supine to Sit;Sit to Supine     Supine to sit: Min guard;Min assist Sit to supine: Min assist   General bed mobility comments: assist for LLE plus increased time  Transfers Overall transfer level: Needs assistance Equipment used: Rolling walker (2 wheeled) Transfers: Sit to/from Stand Sit to Stand: Supervision;Min guard         General transfer comment: increased time and one VC for turn completion prior to sit  Ambulation/Gait Ambulation/Gait assistance: Supervision;Min guard Ambulation Distance (Feet): 18 Feet Assistive device: Rolling walker (2 wheeled) Gait Pattern/deviations: Step-to pattern;Decreased stance time - left     General Gait Details: slow speed, cues for sequence and RW position from self   Stairs            Wheelchair Mobility    Modified Rankin (Stroke Patients Only)       Balance                                    Cognition Arousal/Alertness: Awake/alert Behavior During Therapy: WFL for tasks assessed/performed Overall Cognitive Status: Within Functional Limits for tasks assessed                       Exercises      General Comments        Pertinent Vitals/Pain Pain Assessment: 0-10 Pain Score: 10-Worst pain ever Pain Location: L knee Pain Descriptors / Indicators: Grimacing Pain Intervention(s): Monitored during session;Repositioned;Ice applied;Patient requesting pain meds-RN notified    Home Living                      Prior Function            PT Goals (current goals can now be found in the care plan section) Progress towards PT goals: Progressing toward goals    Frequency  7X/week    PT Plan Current plan remains appropriate    Co-evaluation             End of Session Equipment Utilized During Treatment: Gait belt Activity Tolerance: Other (comment);Patient limited by pain (limited by nausea) Patient left: in bed;with call bell/phone within reach;with bed alarm set     Time: [id number] PT Time Calculation (min) (ACUTE ONLY): 16 min  Charges:  $Gait Training: 8-22 mins                    G Codes:      Rica Koyanagi  PTA [facility]  Acute  Rehab Pager      [phone]

## 2015-09-30 NOTE — Progress Notes (Signed)
Physical Therapy Treatment Patient Details Name: Heidi Adams MRN: [place]:[id number] DOB: 1935/11/27 Today's Date: 09/30/2015    History of Present Illness [name]    PT Comments    POD # 2 pm session.  Pt feeling "somewhat" batter.  Tolerated lunch.  Assisted to bathroom at Supervision level and then practiced stairs.  Slow but steady gait.  Pt plans to D/C to home today.  Follow Up Recommendations  Home health PT;Supervision for mobility/OOB     Equipment Recommendations  Rolling walker with 5" wheels    Recommendations for Other Services       Precautions / Restrictions Precautions Precautions: Knee Precaution Comments: instructed on NO pillow under knee Restrictions Weight Bearing Restrictions: No Other Position/Activity Restrictions: WBAT    Mobility  Bed Mobility Overal bed mobility: Needs Assistance Bed Mobility: Supine to Sit;Sit to Supine     Supine to sit: Min guard;Min assist Sit to supine: Min assist   General bed mobility comments: assist for LLE plus increased time  Transfers Overall transfer level: Needs assistance Equipment used: Rolling walker (2 wheeled) Transfers: Sit to/from Stand Sit to Stand: Supervision;Min guard         General transfer comment: increased time and one VC for turn completion prior to sit  Ambulation/Gait Ambulation/Gait assistance: Supervision;Min guard Ambulation Distance (Feet): 25 Feet Assistive device: Rolling walker (2 wheeled) Gait Pattern/deviations: Step-to pattern;Decreased stance time - left     General Gait Details: slow speed, cues for sequence and RW position from self   Stairs Stairs: Yes Stairs assistance: Supervision Stair Management: Two rails;Step to pattern;Forwards Number of Stairs: 2 General stair comments: only one initial VC needed for proper sequencing and safety.  Performed well.   Wheelchair Mobility    Modified Rankin (Stroke Patients Only)       Balance                                     Cognition Arousal/Alertness: Awake/alert Behavior During Therapy: WFL for tasks assessed/performed Overall Cognitive Status: Within Functional Limits for tasks assessed                      Exercises      General Comments        Pertinent Vitals/Pain Pain Assessment: 0-10 Pain Score: 10-Worst pain ever Pain Location: L knee Pain Descriptors / Indicators: Grimacing Pain Intervention(s): Monitored during session;Repositioned;Ice applied;Patient requesting pain meds-RN notified    Home Living                      Prior Function            PT Goals (current goals can now be found in the care plan section) Progress towards PT goals: Progressing toward goals    Frequency  7X/week    PT Plan Current plan remains appropriate    Co-evaluation             End of Session Equipment Utilized During Treatment: Gait belt Activity Tolerance: Other (comment);Patient limited by pain (limited by nausea) Patient left: in bed;with call bell/phone within reach;with bed alarm set     Time: [id number] PT Time Calculation (min) (ACUTE ONLY): 15 min  Charges:  $Gait Training: 8-22 mins                    G Codes:  Rica Koyanagi  PTA [facility]  Acute  Rehab Pager      [phone]

## 2015-10-01 DIAGNOSIS — Z6830 Body mass index (BMI) 30.0-30.9, adult: Secondary | ICD-10-CM | POA: Diagnosis not present

## 2015-10-01 DIAGNOSIS — E890 Postprocedural hypothyroidism: Secondary | ICD-10-CM | POA: Diagnosis not present

## 2015-10-01 DIAGNOSIS — Z471 Aftercare following joint replacement surgery: Secondary | ICD-10-CM | POA: Diagnosis not present

## 2015-10-01 DIAGNOSIS — Z79891 Long term (current) use of opiate analgesic: Secondary | ICD-10-CM | POA: Diagnosis not present

## 2015-10-01 DIAGNOSIS — Z7901 Long term (current) use of anticoagulants: Secondary | ICD-10-CM | POA: Diagnosis not present

## 2015-10-01 DIAGNOSIS — I10 Essential (primary) hypertension: Secondary | ICD-10-CM | POA: Diagnosis not present

## 2015-10-01 DIAGNOSIS — Z87891 Personal history of nicotine dependence: Secondary | ICD-10-CM | POA: Diagnosis not present

## 2015-10-01 DIAGNOSIS — E669 Obesity, unspecified: Secondary | ICD-10-CM | POA: Diagnosis not present

## 2015-10-01 DIAGNOSIS — Z96652 Presence of left artificial knee joint: Secondary | ICD-10-CM | POA: Diagnosis not present

## 2015-10-05 NOTE — Discharge Summary (Signed)
Physician Discharge Summary  Patient ID: Heidi Adams MRN: [id number] DOB/AGE: 1936-07-16 80 y.o.  Admit date: 09/28/2015 Discharge date: 09/30/2015   Procedures:  Procedure(s) (LRB): LEFT TOTAL KNEE ARTHROPLASTY (Left)  Attending Physician:  Dr. Paralee Cancel   Admission Diagnoses:   Left knee primary OA / pain  Discharge Diagnoses:  Principal Problem:   S/P left TKA Active Problems:   S/P knee replacement  Past Medical History  Diagnosis Date  . Thyroiditis 1973  . Hypertension   . PVC (premature ventricular contraction)   . Arthritis   . History of skin cancer     HPI:    Heidi Adams, 80 y.o. female, has a history of pain and functional disability in the left knee due to arthritis and has failed non-surgical conservative treatments for greater than 12 weeks to includeNSAID's and/or analgesics, corticosteriod injections, viscosupplementation injections and activity modification. Onset of symptoms was gradual, starting >10 years ago with gradually worsening course since that time. The patient noted prior procedures on the knee to include arthroscopy on the left knee(s). Patient currently rates pain in the left knee(s) at 7 out of 10 with activity. Patient has worsening of pain with activity and weight bearing, pain that interferes with activities of daily living, pain with passive range of motion, crepitus and joint swelling. Patient has evidence of periarticular osteophytes and joint space narrowing by imaging studies. There is no active infection. Risks, benefits and expectations were discussed with the patient. Risks including but not limited to the risk of anesthesia, blood clots, nerve damage, blood vessel damage, failure of the prosthesis, infection and up to and including death. Patient understand the risks, benefits and expectations and wishes to proceed with surgery.  PCP: Heidi Reel, MD   Discharged Condition: good  Hospital Course:  Patient underwent the  above stated procedure on 09/28/2015. Patient tolerated the procedure well and brought to the recovery room in good condition and subsequently to the floor.  POD #1 BP: 127/79 ; Pulse: 83 ; Temp: 98 F (36.7 C) ; Resp: 15 Patient reports pain as moderate. Doing ok. Significant history of urinary frequency, incontinence. Neurovascular intact and incision: dressing C/D/I  LABS  Basename    HGB     12.2  HCT     39.5   POD #2  BP: 167/69 ; Pulse: 83 ; Temp: 98.6 F (37 C) ; Resp: 16 Patient reports pain as mild, pain controlled. No events throughout the night. Ready to be discharged home. Dorsiflexion/plantar flexion intact, incision: dressing C/D/I, no cellulitis present and compartment soft.   LABS  Basename    HGB     11.1  HCT     34.4    Discharge Exam: General appearance: alert, cooperative and no distress Extremities: Homans sign is negative, no sign of DVT, no edema, redness or tenderness in the calves or thighs and no ulcers, gangrene or trophic changes  Disposition: Home with follow up in 2 weeks   Follow-up Information    Follow up with [facility].   Why:  rolling walker and 3n1 (over the commode seat)   Contact information:   [address] [place] [address] [phone]       Follow up with [facility].   Why:  home health physical therapy   Contact information:   [address] [address] [phone]       Follow up with [name], MD. Schedule  an appointment as soon as possible for a visit in 2 weeks.   Specialty:  Orthopedic Surgery   Contact information:   [address] [place] [address] [id number]       Discharge Instructions    Call MD / Call 911    Complete by:  As directed   If you experience chest pain or shortness of breath, CALL 911 and be transported to the hospital emergency room.  If you develope a fever above 101 F, pus (white drainage) or  increased drainage or redness at the wound, or calf pain, call your surgeon's office.     Change dressing    Complete by:  As directed   Maintain surgical dressing until follow up in the clinic. If the edges start to pull up, may reinforce with tape. If the dressing is no longer working, may remove and cover with gauze and tape, but must keep the area dry and clean.  Call with any questions or concerns.     Constipation Prevention    Complete by:  As directed   Drink plenty of fluids.  Prune juice may be helpful.  You may use a stool softener, such as Colace (over the counter) 100 mg twice a day.  Use MiraLax (over the counter) for constipation as needed.     Diet - low sodium heart healthy    Complete by:  As directed      Discharge instructions    Complete by:  As directed   Maintain surgical dressing until follow up in the clinic. If the edges start to pull up, may reinforce with tape. If the dressing is no longer working, may remove and cover with gauze and tape, but must keep the area dry and clean.  Follow up in 2 weeks at [facility]. Call with any questions or concerns.     Increase activity slowly as tolerated    Complete by:  As directed   Weight bearing as tolerated with assist device (walker, cane, etc) as directed, use it as long as suggested by your surgeon or therapist, typically at least 4-6 weeks.     TED hose    Complete by:  As directed   Use stockings (TED hose) for 2 weeks on both leg(s).  You may remove them at night for sleeping.             Medication List    STOP taking these medications        fluconazole 200 MG tablet  Commonly known as:  DIFLUCAN     nystatin-triamcinolone ointment  Commonly known as:  MYCOLOG      TAKE these medications        cholecalciferol 1000 units tablet  Commonly known as:  VITAMIN D  Take 1,000 Units by mouth daily.     docusate sodium 100 MG capsule  Commonly known as:  COLACE  Take 1 capsule (100 mg total)  by mouth 2 (two) times daily.     ferrous sulfate 325 (65 FE) MG tablet  Take 1 tablet (325 mg total) by mouth 3 (three) times daily after meals.     hydrochlorothiazide 25 MG tablet  Commonly known as:  HYDRODIURIL  Take 25 mg by mouth daily.     levothyroxine 125 MCG tablet  Commonly known as:  SYNTHROID, LEVOTHROID  Take 125 mcg by mouth daily.     orlistat 60 MG capsule  Commonly known as:  ALLI  Take 60 mg  by mouth every morning.     oxyCODONE 5 MG immediate release tablet  Commonly known as:  Oxy IR/ROXICODONE  Take 1-3 tablets (5-15 mg total) by mouth every 4 (four) hours as needed for severe pain.     polyethylene glycol packet  Commonly known as:  MIRALAX / GLYCOLAX  Take 17 g by mouth 2 (two) times daily.     rivaroxaban 10 MG Tabs tablet  Commonly known as:  XARELTO  Take 1 tablet (10 mg total) by mouth daily.     tiZANidine 4 MG tablet  Commonly known as:  ZANAFLEX  Take 1 tablet (4 mg total) by mouth every 6 (six) hours as needed for muscle spasms.     TURMERIC PO  Take 1 tablet by mouth daily.         Signed: [place] Pugh. Ellory Khurana   PA-C  10/05/2015, 9:48 AM

## 2015-10-06 DIAGNOSIS — I10 Essential (primary) hypertension: Secondary | ICD-10-CM | POA: Diagnosis not present

## 2015-10-06 DIAGNOSIS — Z6830 Body mass index (BMI) 30.0-30.9, adult: Secondary | ICD-10-CM | POA: Diagnosis not present

## 2015-10-06 DIAGNOSIS — Z471 Aftercare following joint replacement surgery: Secondary | ICD-10-CM | POA: Diagnosis not present

## 2015-10-06 DIAGNOSIS — Z79891 Long term (current) use of opiate analgesic: Secondary | ICD-10-CM | POA: Diagnosis not present

## 2015-10-06 DIAGNOSIS — Z7901 Long term (current) use of anticoagulants: Secondary | ICD-10-CM | POA: Diagnosis not present

## 2015-10-06 DIAGNOSIS — E669 Obesity, unspecified: Secondary | ICD-10-CM | POA: Diagnosis not present

## 2015-10-06 DIAGNOSIS — E890 Postprocedural hypothyroidism: Secondary | ICD-10-CM | POA: Diagnosis not present

## 2015-10-06 DIAGNOSIS — Z87891 Personal history of nicotine dependence: Secondary | ICD-10-CM | POA: Diagnosis not present

## 2015-10-06 DIAGNOSIS — Z96652 Presence of left artificial knee joint: Secondary | ICD-10-CM | POA: Diagnosis not present

## 2015-10-14 DIAGNOSIS — E669 Obesity, unspecified: Secondary | ICD-10-CM | POA: Diagnosis not present

## 2015-10-14 DIAGNOSIS — Z7901 Long term (current) use of anticoagulants: Secondary | ICD-10-CM | POA: Diagnosis not present

## 2015-10-14 DIAGNOSIS — Z96652 Presence of left artificial knee joint: Secondary | ICD-10-CM | POA: Diagnosis not present

## 2015-10-14 DIAGNOSIS — E890 Postprocedural hypothyroidism: Secondary | ICD-10-CM | POA: Diagnosis not present

## 2015-10-14 DIAGNOSIS — Z471 Aftercare following joint replacement surgery: Secondary | ICD-10-CM | POA: Diagnosis not present

## 2015-10-14 DIAGNOSIS — Z87891 Personal history of nicotine dependence: Secondary | ICD-10-CM | POA: Diagnosis not present

## 2015-10-14 DIAGNOSIS — I10 Essential (primary) hypertension: Secondary | ICD-10-CM | POA: Diagnosis not present

## 2015-10-14 DIAGNOSIS — Z79891 Long term (current) use of opiate analgesic: Secondary | ICD-10-CM | POA: Diagnosis not present

## 2015-10-14 DIAGNOSIS — Z6830 Body mass index (BMI) 30.0-30.9, adult: Secondary | ICD-10-CM | POA: Diagnosis not present

## 2015-10-19 DIAGNOSIS — R262 Difficulty in walking, not elsewhere classified: Secondary | ICD-10-CM | POA: Diagnosis not present

## 2015-10-19 DIAGNOSIS — M25562 Pain in left knee: Secondary | ICD-10-CM | POA: Diagnosis not present

## 2015-10-19 DIAGNOSIS — M1712 Unilateral primary osteoarthritis, left knee: Secondary | ICD-10-CM | POA: Diagnosis not present

## 2015-10-22 DIAGNOSIS — M1712 Unilateral primary osteoarthritis, left knee: Secondary | ICD-10-CM | POA: Diagnosis not present

## 2015-10-22 DIAGNOSIS — R262 Difficulty in walking, not elsewhere classified: Secondary | ICD-10-CM | POA: Diagnosis not present

## 2015-10-22 DIAGNOSIS — M25562 Pain in left knee: Secondary | ICD-10-CM | POA: Diagnosis not present

## 2015-10-23 DIAGNOSIS — R262 Difficulty in walking, not elsewhere classified: Secondary | ICD-10-CM | POA: Diagnosis not present

## 2015-10-23 DIAGNOSIS — M25562 Pain in left knee: Secondary | ICD-10-CM | POA: Diagnosis not present

## 2015-10-23 DIAGNOSIS — M1712 Unilateral primary osteoarthritis, left knee: Secondary | ICD-10-CM | POA: Diagnosis not present

## 2015-10-27 DIAGNOSIS — M25562 Pain in left knee: Secondary | ICD-10-CM | POA: Diagnosis not present

## 2015-10-27 DIAGNOSIS — R262 Difficulty in walking, not elsewhere classified: Secondary | ICD-10-CM | POA: Diagnosis not present

## 2015-10-27 DIAGNOSIS — M1712 Unilateral primary osteoarthritis, left knee: Secondary | ICD-10-CM | POA: Diagnosis not present

## 2015-10-28 DIAGNOSIS — R262 Difficulty in walking, not elsewhere classified: Secondary | ICD-10-CM | POA: Diagnosis not present

## 2015-10-28 DIAGNOSIS — M1712 Unilateral primary osteoarthritis, left knee: Secondary | ICD-10-CM | POA: Diagnosis not present

## 2015-10-28 DIAGNOSIS — M25562 Pain in left knee: Secondary | ICD-10-CM | POA: Diagnosis not present

## 2015-10-30 DIAGNOSIS — R262 Difficulty in walking, not elsewhere classified: Secondary | ICD-10-CM | POA: Diagnosis not present

## 2015-10-30 DIAGNOSIS — M1712 Unilateral primary osteoarthritis, left knee: Secondary | ICD-10-CM | POA: Diagnosis not present

## 2015-10-30 DIAGNOSIS — M25562 Pain in left knee: Secondary | ICD-10-CM | POA: Diagnosis not present

## 2015-11-02 DIAGNOSIS — R262 Difficulty in walking, not elsewhere classified: Secondary | ICD-10-CM | POA: Diagnosis not present

## 2015-11-02 DIAGNOSIS — M25562 Pain in left knee: Secondary | ICD-10-CM | POA: Diagnosis not present

## 2015-11-02 DIAGNOSIS — M1712 Unilateral primary osteoarthritis, left knee: Secondary | ICD-10-CM | POA: Diagnosis not present

## 2015-11-05 DIAGNOSIS — R262 Difficulty in walking, not elsewhere classified: Secondary | ICD-10-CM | POA: Diagnosis not present

## 2015-11-05 DIAGNOSIS — M1712 Unilateral primary osteoarthritis, left knee: Secondary | ICD-10-CM | POA: Diagnosis not present

## 2015-11-05 DIAGNOSIS — M25562 Pain in left knee: Secondary | ICD-10-CM | POA: Diagnosis not present

## 2015-11-09 DIAGNOSIS — R55 Syncope and collapse: Secondary | ICD-10-CM | POA: Diagnosis not present

## 2015-11-09 DIAGNOSIS — R002 Palpitations: Secondary | ICD-10-CM | POA: Diagnosis not present

## 2015-11-09 DIAGNOSIS — I951 Orthostatic hypotension: Secondary | ICD-10-CM | POA: Diagnosis not present

## 2015-11-09 DIAGNOSIS — E876 Hypokalemia: Secondary | ICD-10-CM | POA: Diagnosis not present

## 2015-11-09 DIAGNOSIS — H6691 Otitis media, unspecified, right ear: Secondary | ICD-10-CM | POA: Diagnosis not present

## 2015-11-09 DIAGNOSIS — I10 Essential (primary) hypertension: Secondary | ICD-10-CM | POA: Diagnosis not present

## 2015-11-09 DIAGNOSIS — E038 Other specified hypothyroidism: Secondary | ICD-10-CM | POA: Diagnosis not present

## 2015-11-09 DIAGNOSIS — Z6828 Body mass index (BMI) 28.0-28.9, adult: Secondary | ICD-10-CM | POA: Diagnosis not present

## 2015-11-13 DIAGNOSIS — I10 Essential (primary) hypertension: Secondary | ICD-10-CM | POA: Diagnosis not present

## 2015-11-13 DIAGNOSIS — I951 Orthostatic hypotension: Secondary | ICD-10-CM | POA: Diagnosis not present

## 2015-11-13 DIAGNOSIS — R42 Dizziness and giddiness: Secondary | ICD-10-CM | POA: Diagnosis not present

## 2015-11-13 DIAGNOSIS — H6691 Otitis media, unspecified, right ear: Secondary | ICD-10-CM | POA: Diagnosis not present

## 2015-11-13 DIAGNOSIS — R002 Palpitations: Secondary | ICD-10-CM | POA: Diagnosis not present

## 2015-11-13 DIAGNOSIS — E876 Hypokalemia: Secondary | ICD-10-CM | POA: Diagnosis not present

## 2015-11-18 DIAGNOSIS — Z96652 Presence of left artificial knee joint: Secondary | ICD-10-CM | POA: Diagnosis not present

## 2015-11-18 DIAGNOSIS — Z471 Aftercare following joint replacement surgery: Secondary | ICD-10-CM | POA: Diagnosis not present

## 2015-11-23 DIAGNOSIS — R262 Difficulty in walking, not elsewhere classified: Secondary | ICD-10-CM | POA: Diagnosis not present

## 2015-11-23 DIAGNOSIS — M25562 Pain in left knee: Secondary | ICD-10-CM | POA: Diagnosis not present

## 2015-11-23 DIAGNOSIS — M1712 Unilateral primary osteoarthritis, left knee: Secondary | ICD-10-CM | POA: Diagnosis not present

## 2015-11-24 ENCOUNTER — Other Ambulatory Visit (HOSPITAL_COMMUNITY): Payer: Self-pay | Admitting: Internal Medicine

## 2015-11-24 DIAGNOSIS — R55 Syncope and collapse: Secondary | ICD-10-CM

## 2015-11-25 DIAGNOSIS — H6691 Otitis media, unspecified, right ear: Secondary | ICD-10-CM | POA: Diagnosis not present

## 2015-11-25 DIAGNOSIS — Z6829 Body mass index (BMI) 29.0-29.9, adult: Secondary | ICD-10-CM | POA: Diagnosis not present

## 2015-11-25 DIAGNOSIS — I10 Essential (primary) hypertension: Secondary | ICD-10-CM | POA: Diagnosis not present

## 2015-11-25 DIAGNOSIS — R42 Dizziness and giddiness: Secondary | ICD-10-CM | POA: Diagnosis not present

## 2015-11-26 DIAGNOSIS — F419 Anxiety disorder, unspecified: Secondary | ICD-10-CM | POA: Diagnosis not present

## 2015-11-26 DIAGNOSIS — M1712 Unilateral primary osteoarthritis, left knee: Secondary | ICD-10-CM | POA: Diagnosis not present

## 2015-11-26 DIAGNOSIS — Z79899 Other long term (current) drug therapy: Secondary | ICD-10-CM | POA: Diagnosis not present

## 2015-11-26 DIAGNOSIS — I10 Essential (primary) hypertension: Secondary | ICD-10-CM | POA: Insufficient documentation

## 2015-11-26 DIAGNOSIS — I951 Orthostatic hypotension: Secondary | ICD-10-CM | POA: Diagnosis not present

## 2015-11-26 DIAGNOSIS — I493 Ventricular premature depolarization: Secondary | ICD-10-CM | POA: Diagnosis not present

## 2015-11-26 DIAGNOSIS — I771 Stricture of artery: Secondary | ICD-10-CM | POA: Diagnosis not present

## 2015-11-26 DIAGNOSIS — Z96652 Presence of left artificial knee joint: Secondary | ICD-10-CM | POA: Diagnosis not present

## 2015-11-26 DIAGNOSIS — R42 Dizziness and giddiness: Secondary | ICD-10-CM | POA: Diagnosis not present

## 2015-11-26 DIAGNOSIS — E039 Hypothyroidism, unspecified: Secondary | ICD-10-CM | POA: Diagnosis not present

## 2015-11-26 DIAGNOSIS — R1310 Dysphagia, unspecified: Secondary | ICD-10-CM | POA: Diagnosis not present

## 2015-11-26 DIAGNOSIS — Z90710 Acquired absence of both cervix and uterus: Secondary | ICD-10-CM | POA: Diagnosis not present

## 2015-11-26 DIAGNOSIS — R000 Tachycardia, unspecified: Secondary | ICD-10-CM | POA: Diagnosis not present

## 2015-11-26 DIAGNOSIS — M25562 Pain in left knee: Secondary | ICD-10-CM | POA: Diagnosis not present

## 2015-11-26 DIAGNOSIS — R262 Difficulty in walking, not elsewhere classified: Secondary | ICD-10-CM | POA: Diagnosis not present

## 2015-11-26 DIAGNOSIS — Z87891 Personal history of nicotine dependence: Secondary | ICD-10-CM | POA: Diagnosis not present

## 2015-11-26 DIAGNOSIS — E876 Hypokalemia: Secondary | ICD-10-CM | POA: Diagnosis not present

## 2015-11-27 ENCOUNTER — Ambulatory Visit (HOSPITAL_COMMUNITY): Admission: RE | Admit: 2015-11-27 | Payer: PPO | Source: Ambulatory Visit

## 2015-11-27 DIAGNOSIS — I10 Essential (primary) hypertension: Secondary | ICD-10-CM | POA: Diagnosis not present

## 2015-11-27 DIAGNOSIS — E039 Hypothyroidism, unspecified: Secondary | ICD-10-CM | POA: Diagnosis not present

## 2015-11-27 DIAGNOSIS — E876 Hypokalemia: Secondary | ICD-10-CM | POA: Diagnosis not present

## 2015-11-27 DIAGNOSIS — I509 Heart failure, unspecified: Secondary | ICD-10-CM | POA: Diagnosis not present

## 2015-11-28 DIAGNOSIS — F419 Anxiety disorder, unspecified: Secondary | ICD-10-CM | POA: Diagnosis not present

## 2015-11-28 DIAGNOSIS — E039 Hypothyroidism, unspecified: Secondary | ICD-10-CM | POA: Diagnosis not present

## 2015-11-28 DIAGNOSIS — I10 Essential (primary) hypertension: Secondary | ICD-10-CM | POA: Diagnosis not present

## 2015-11-28 DIAGNOSIS — E876 Hypokalemia: Secondary | ICD-10-CM | POA: Diagnosis not present

## 2015-11-28 DIAGNOSIS — R05 Cough: Secondary | ICD-10-CM | POA: Diagnosis not present

## 2015-11-28 DIAGNOSIS — I951 Orthostatic hypotension: Secondary | ICD-10-CM | POA: Diagnosis not present

## 2015-11-29 DIAGNOSIS — R1310 Dysphagia, unspecified: Secondary | ICD-10-CM | POA: Diagnosis not present

## 2015-11-29 DIAGNOSIS — E038 Other specified hypothyroidism: Secondary | ICD-10-CM | POA: Diagnosis not present

## 2015-11-29 DIAGNOSIS — I951 Orthostatic hypotension: Secondary | ICD-10-CM | POA: Diagnosis not present

## 2015-11-29 DIAGNOSIS — E876 Hypokalemia: Secondary | ICD-10-CM | POA: Diagnosis not present

## 2015-11-29 DIAGNOSIS — R000 Tachycardia, unspecified: Secondary | ICD-10-CM | POA: Diagnosis not present

## 2015-11-29 DIAGNOSIS — E039 Hypothyroidism, unspecified: Secondary | ICD-10-CM | POA: Diagnosis not present

## 2015-11-29 DIAGNOSIS — F419 Anxiety disorder, unspecified: Secondary | ICD-10-CM | POA: Diagnosis not present

## 2015-11-29 DIAGNOSIS — I10 Essential (primary) hypertension: Secondary | ICD-10-CM | POA: Diagnosis not present

## 2015-11-29 DIAGNOSIS — R42 Dizziness and giddiness: Secondary | ICD-10-CM | POA: Diagnosis not present

## 2015-11-30 DIAGNOSIS — E039 Hypothyroidism, unspecified: Secondary | ICD-10-CM | POA: Diagnosis not present

## 2015-11-30 DIAGNOSIS — I10 Essential (primary) hypertension: Secondary | ICD-10-CM | POA: Diagnosis not present

## 2015-11-30 DIAGNOSIS — E876 Hypokalemia: Secondary | ICD-10-CM | POA: Diagnosis not present

## 2015-12-02 DIAGNOSIS — M25562 Pain in left knee: Secondary | ICD-10-CM | POA: Diagnosis not present

## 2015-12-02 DIAGNOSIS — M1712 Unilateral primary osteoarthritis, left knee: Secondary | ICD-10-CM | POA: Diagnosis not present

## 2015-12-02 DIAGNOSIS — R262 Difficulty in walking, not elsewhere classified: Secondary | ICD-10-CM | POA: Diagnosis not present

## 2015-12-03 DIAGNOSIS — R42 Dizziness and giddiness: Secondary | ICD-10-CM | POA: Diagnosis not present

## 2015-12-03 DIAGNOSIS — I951 Orthostatic hypotension: Secondary | ICD-10-CM | POA: Diagnosis not present

## 2015-12-03 DIAGNOSIS — H6981 Other specified disorders of Eustachian tube, right ear: Secondary | ICD-10-CM | POA: Diagnosis not present

## 2015-12-03 DIAGNOSIS — I10 Essential (primary) hypertension: Secondary | ICD-10-CM | POA: Diagnosis not present

## 2015-12-07 ENCOUNTER — Other Ambulatory Visit (HOSPITAL_COMMUNITY): Payer: PPO

## 2015-12-07 DIAGNOSIS — R262 Difficulty in walking, not elsewhere classified: Secondary | ICD-10-CM | POA: Diagnosis not present

## 2015-12-07 DIAGNOSIS — M1712 Unilateral primary osteoarthritis, left knee: Secondary | ICD-10-CM | POA: Diagnosis not present

## 2015-12-07 DIAGNOSIS — M25562 Pain in left knee: Secondary | ICD-10-CM | POA: Diagnosis not present

## 2015-12-08 DIAGNOSIS — I10 Essential (primary) hypertension: Secondary | ICD-10-CM | POA: Diagnosis not present

## 2015-12-09 DIAGNOSIS — M1712 Unilateral primary osteoarthritis, left knee: Secondary | ICD-10-CM | POA: Diagnosis not present

## 2015-12-09 DIAGNOSIS — R262 Difficulty in walking, not elsewhere classified: Secondary | ICD-10-CM | POA: Diagnosis not present

## 2015-12-09 DIAGNOSIS — M25562 Pain in left knee: Secondary | ICD-10-CM | POA: Diagnosis not present

## 2015-12-14 DIAGNOSIS — M1712 Unilateral primary osteoarthritis, left knee: Secondary | ICD-10-CM | POA: Diagnosis not present

## 2015-12-14 DIAGNOSIS — M25562 Pain in left knee: Secondary | ICD-10-CM | POA: Diagnosis not present

## 2015-12-14 DIAGNOSIS — R262 Difficulty in walking, not elsewhere classified: Secondary | ICD-10-CM | POA: Diagnosis not present

## 2015-12-15 DIAGNOSIS — H8111 Benign paroxysmal vertigo, right ear: Secondary | ICD-10-CM | POA: Insufficient documentation

## 2015-12-15 DIAGNOSIS — R1314 Dysphagia, pharyngoesophageal phase: Secondary | ICD-10-CM | POA: Diagnosis not present

## 2015-12-15 DIAGNOSIS — R42 Dizziness and giddiness: Secondary | ICD-10-CM | POA: Diagnosis not present

## 2015-12-16 ENCOUNTER — Other Ambulatory Visit: Payer: Self-pay | Admitting: Otolaryngology

## 2015-12-16 DIAGNOSIS — R1314 Dysphagia, pharyngoesophageal phase: Secondary | ICD-10-CM

## 2015-12-24 ENCOUNTER — Ambulatory Visit
Admission: RE | Admit: 2015-12-24 | Discharge: 2015-12-24 | Disposition: A | Discharge status: Home / Self Care | Payer: PPO | Source: Ambulatory Visit | Attending: Otolaryngology | Admitting: Otolaryngology

## 2015-12-24 DIAGNOSIS — R1314 Dysphagia, pharyngoesophageal phase: Secondary | ICD-10-CM

## 2015-12-24 DIAGNOSIS — K219 Gastro-esophageal reflux disease without esophagitis: Secondary | ICD-10-CM | POA: Diagnosis not present

## 2015-12-24 DIAGNOSIS — R1310 Dysphagia, unspecified: Secondary | ICD-10-CM | POA: Diagnosis not present

## 2015-12-24 IMAGING — RF DG ESOPHAGUS
15 of 19 series · 19 of 24 positions shown · non-contrast
Comparison: None.

CLINICAL DATA: Pharyngo esophageal dysphagia

EXAM:
ESOPHOGRAM/BARIUM SWALLOW
TECHNIQUE: Single contrast examination was performed using  thin barium.
FLUOROSCOPY TIME:  Radiation Exposure Index (as provided by the
fluoroscopic device): 78 deciGy per square cm
If the device does not provide the exposure index:
Fluoroscopy Time:  1 minutes 54 second
Number of Acquired Images:

[Series 1: run · 4 of 17 slices shown (1 of 15)]
[im 1/17]
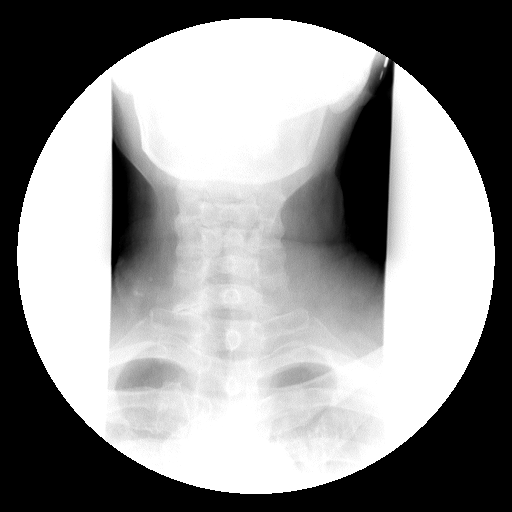
[im 5/17]
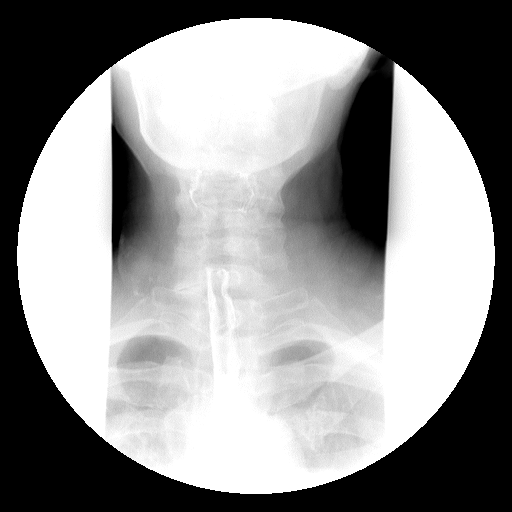
[im 13/17]
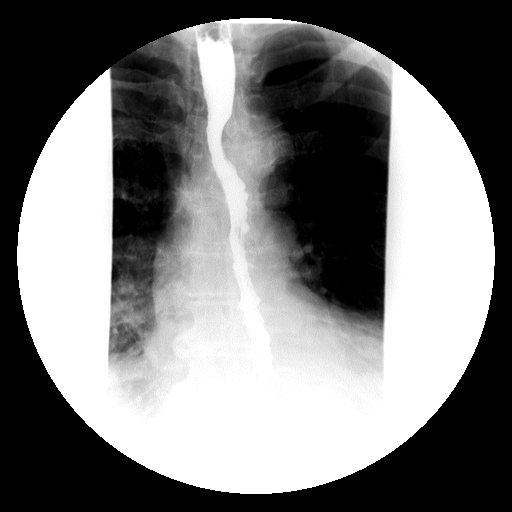
[im 17/17]
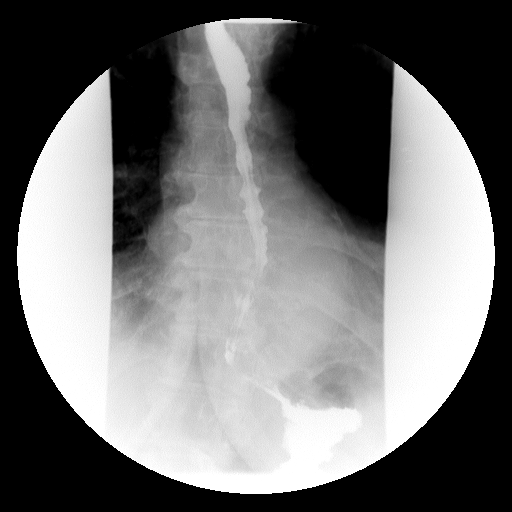

[Series 2: run · 2 of 12 slices shown (2 of 15)]
[im 1/12]
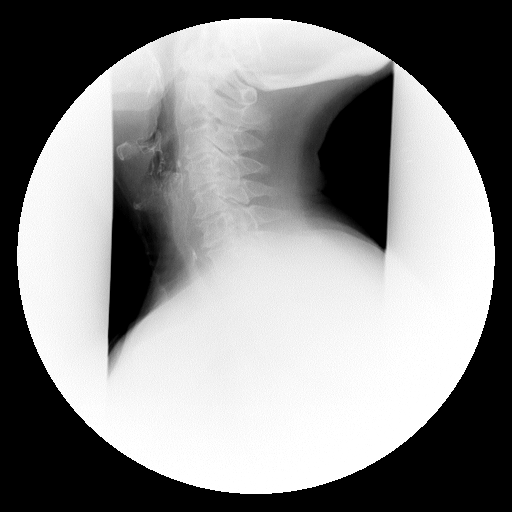
[im 12/12]
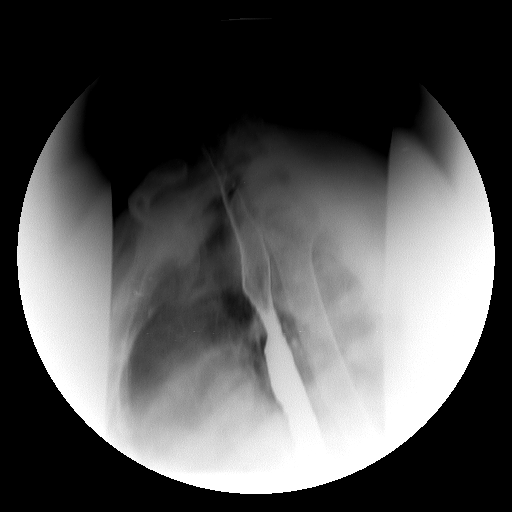

[Series 4: run · 1 of 1 slices shown (3 of 15)]
[im 1/1]
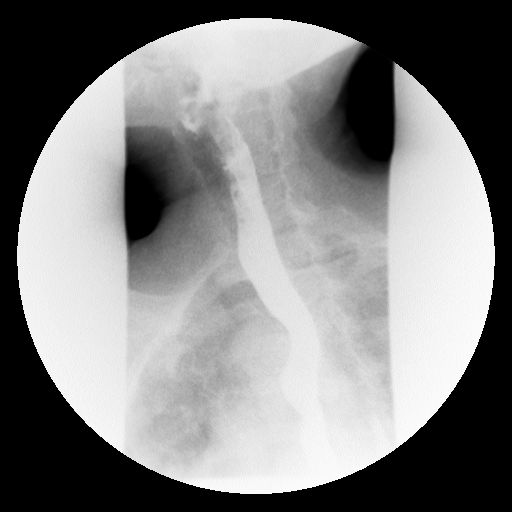

[Series 5: run · 1 of 1 slices shown (4 of 15)]
[im 1/1]
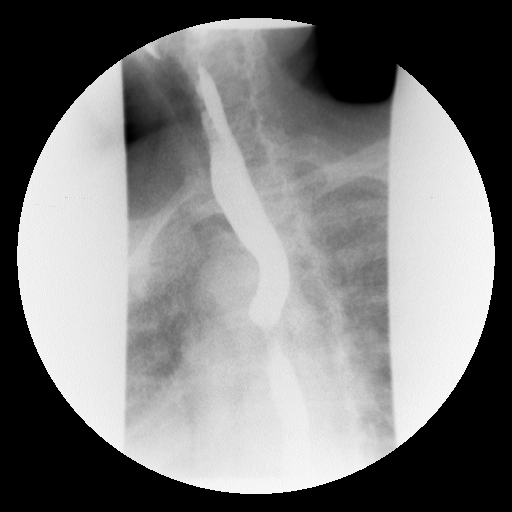

[Series 6: run · 1 of 1 slices shown (5 of 15)]
[im 1/1]
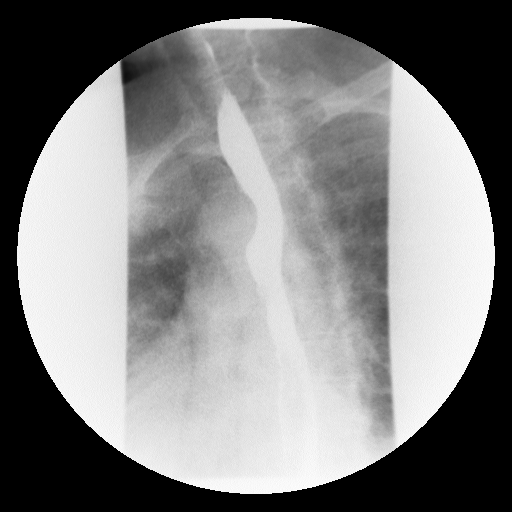

[Series 8: run · 1 of 1 slices shown (6 of 15)]
[im 1/1]
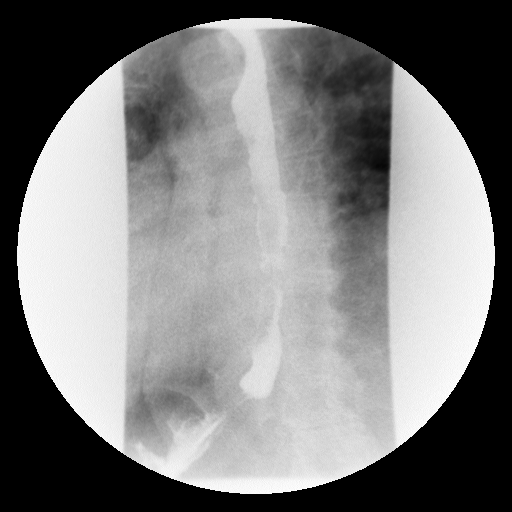

[Series 9: run · 1 of 1 slices shown (7 of 15)]
[im 1/1]
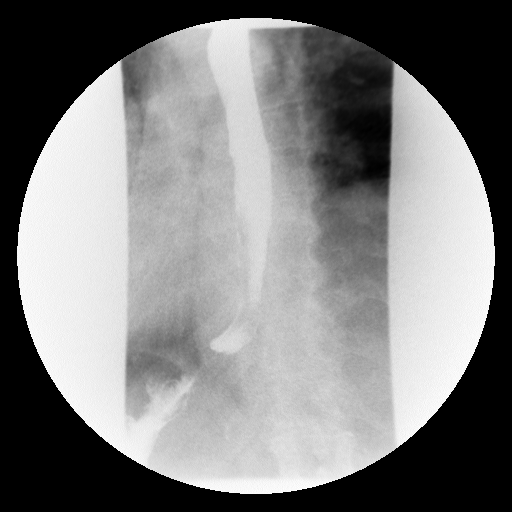

[Series 10: run · 1 of 1 slices shown (8 of 15)]
[im 1/1]
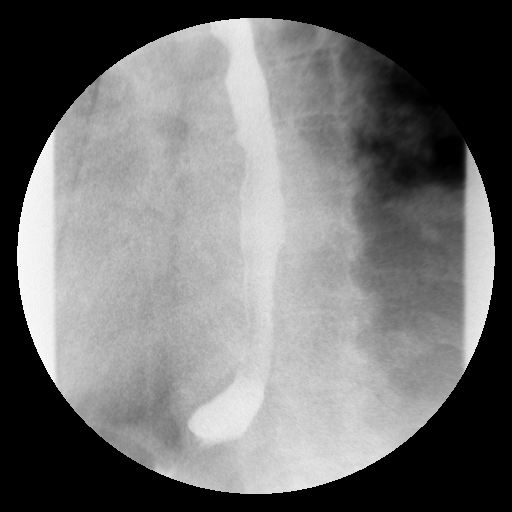

[Series 11: run · 1 of 1 slices shown (9 of 15)]
[im 1/1]
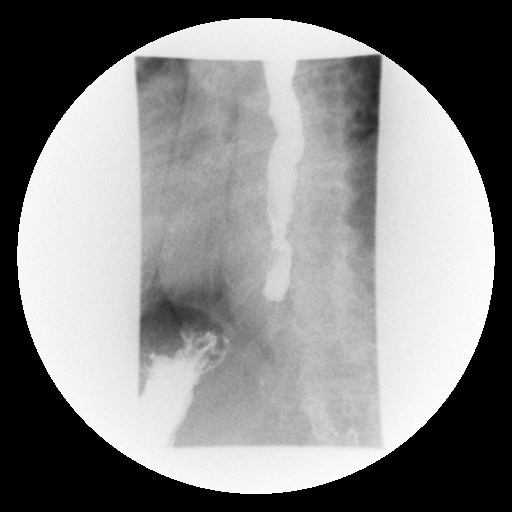

[Series 13: run · 1 of 1 slices shown (10 of 15)]
[im 1/1]
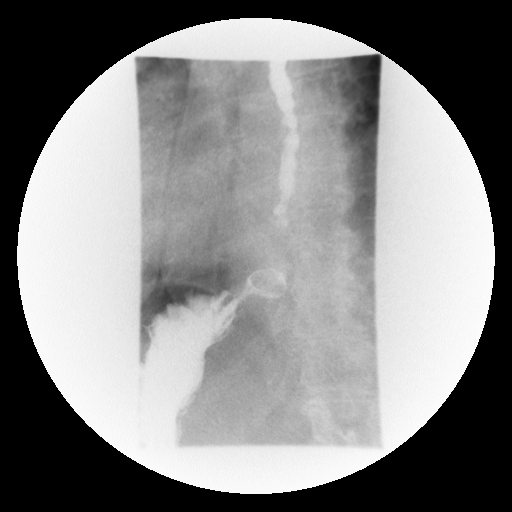

[Series 14: run · 1 of 1 slices shown (11 of 15)]
[im 1/1]
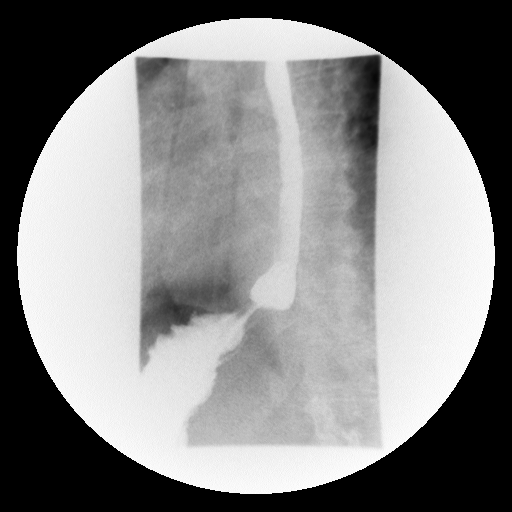

[Series 15: run · 1 of 1 slices shown (12 of 15)]
[im 1/1]
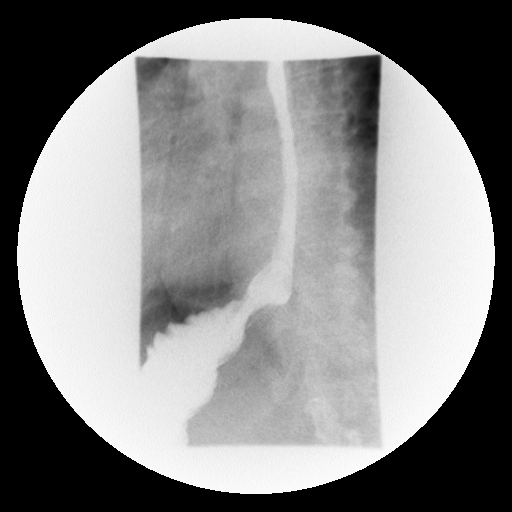

[Series 16: run · 1 of 1 slices shown (13 of 15)]
[im 1/1]
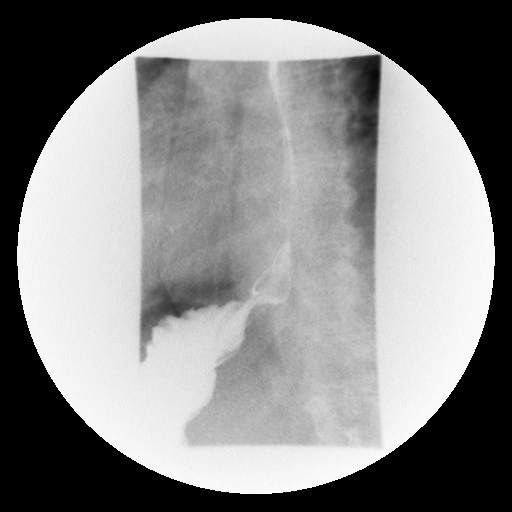

[Series 18: run · 1 of 1 slices shown (14 of 15)]
[im 1/1]
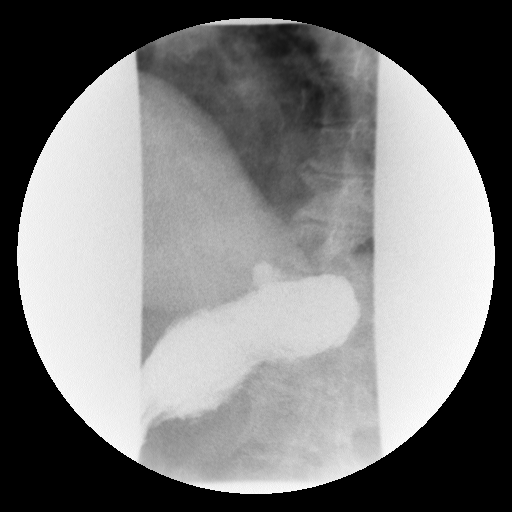

[Series 19: run · 1 of 1 slices shown (15 of 15)]
[im 1/1]
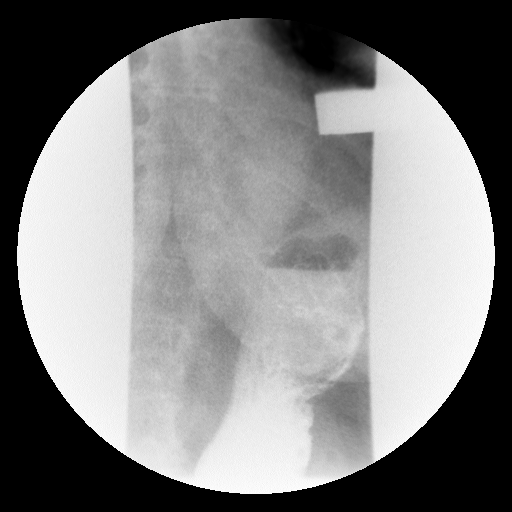

[19 of 24 positions shown; findings below may reference images not displayed]

FINDINGS: Initially rapid sequence spot films of the cervical esophagus were
performed showing the swallowing mechanism to be normal. No
aspiration or penetration is seen. There are moderate tertiary
contractions in the mid and distal esophagus. No definite hiatal
hernia is noted although a tiny hiatal hernia cannot be excluded.
Only faint gastroesophageal reflux is seen. The barium pill was
given at the end the study which did delay in passing into the
stomach just above the gastroesophageal junction, but after
additional water and barium was given the pill did pass intact. This
may indicate a mild narrowing of the distal esophagus.
IMPRESSION: 1. Moderate tertiary contractions in the mid and distal esophagus.
2. Faint gastroesophageal reflux.
3. Barium pill does delay somewhat in passing into the stomach which
may indicate a mild narrowing of the distal esophagus.

## 2016-01-01 DIAGNOSIS — I951 Orthostatic hypotension: Secondary | ICD-10-CM | POA: Diagnosis not present

## 2016-01-01 DIAGNOSIS — I10 Essential (primary) hypertension: Secondary | ICD-10-CM | POA: Diagnosis not present

## 2016-01-01 DIAGNOSIS — E668 Other obesity: Secondary | ICD-10-CM | POA: Diagnosis not present

## 2016-01-01 DIAGNOSIS — Z6827 Body mass index (BMI) 27.0-27.9, adult: Secondary | ICD-10-CM | POA: Diagnosis not present

## 2016-01-01 DIAGNOSIS — R42 Dizziness and giddiness: Secondary | ICD-10-CM | POA: Diagnosis not present

## 2016-01-01 DIAGNOSIS — K5900 Constipation, unspecified: Secondary | ICD-10-CM | POA: Diagnosis not present

## 2016-01-01 DIAGNOSIS — E038 Other specified hypothyroidism: Secondary | ICD-10-CM | POA: Diagnosis not present

## 2016-01-07 DIAGNOSIS — R002 Palpitations: Secondary | ICD-10-CM | POA: Diagnosis not present

## 2016-01-07 DIAGNOSIS — Z6827 Body mass index (BMI) 27.0-27.9, adult: Secondary | ICD-10-CM | POA: Diagnosis not present

## 2016-01-07 DIAGNOSIS — I10 Essential (primary) hypertension: Secondary | ICD-10-CM | POA: Diagnosis not present

## 2016-01-07 DIAGNOSIS — E038 Other specified hypothyroidism: Secondary | ICD-10-CM | POA: Diagnosis not present

## 2016-01-07 DIAGNOSIS — H9319 Tinnitus, unspecified ear: Secondary | ICD-10-CM | POA: Diagnosis not present

## 2016-01-07 DIAGNOSIS — I951 Orthostatic hypotension: Secondary | ICD-10-CM | POA: Diagnosis not present

## 2016-01-07 DIAGNOSIS — R42 Dizziness and giddiness: Secondary | ICD-10-CM | POA: Diagnosis not present

## 2016-01-08 DIAGNOSIS — H8111 Benign paroxysmal vertigo, right ear: Secondary | ICD-10-CM | POA: Diagnosis not present

## 2016-01-08 DIAGNOSIS — R51 Headache: Secondary | ICD-10-CM | POA: Diagnosis not present

## 2016-01-08 DIAGNOSIS — R1314 Dysphagia, pharyngoesophageal phase: Secondary | ICD-10-CM | POA: Diagnosis not present

## 2016-01-08 DIAGNOSIS — R42 Dizziness and giddiness: Secondary | ICD-10-CM | POA: Diagnosis not present

## 2016-01-13 ENCOUNTER — Other Ambulatory Visit: Payer: Self-pay | Admitting: Otolaryngology

## 2016-01-13 DIAGNOSIS — R51 Headache: Secondary | ICD-10-CM

## 2016-01-13 DIAGNOSIS — R42 Dizziness and giddiness: Secondary | ICD-10-CM

## 2016-01-13 DIAGNOSIS — R519 Headache, unspecified: Secondary | ICD-10-CM

## 2016-01-18 DIAGNOSIS — Z471 Aftercare following joint replacement surgery: Secondary | ICD-10-CM | POA: Diagnosis not present

## 2016-01-18 DIAGNOSIS — M7071 Other bursitis of hip, right hip: Secondary | ICD-10-CM | POA: Diagnosis not present

## 2016-01-18 DIAGNOSIS — Z96652 Presence of left artificial knee joint: Secondary | ICD-10-CM | POA: Diagnosis not present

## 2016-01-20 DIAGNOSIS — R1314 Dysphagia, pharyngoesophageal phase: Secondary | ICD-10-CM | POA: Diagnosis not present

## 2016-01-20 DIAGNOSIS — Z800 Family history of malignant neoplasm of digestive organs: Secondary | ICD-10-CM | POA: Diagnosis not present

## 2016-01-20 DIAGNOSIS — R933 Abnormal findings on diagnostic imaging of other parts of digestive tract: Secondary | ICD-10-CM | POA: Diagnosis not present

## 2016-01-20 DIAGNOSIS — K224 Dyskinesia of esophagus: Secondary | ICD-10-CM | POA: Diagnosis not present

## 2016-01-20 DIAGNOSIS — K222 Esophageal obstruction: Secondary | ICD-10-CM | POA: Diagnosis not present

## 2016-01-22 ENCOUNTER — Ambulatory Visit
Admission: RE | Admit: 2016-01-22 | Discharge: 2016-01-22 | Disposition: A | Discharge status: Home / Self Care | Payer: PPO | Source: Ambulatory Visit | Attending: Otolaryngology | Admitting: Otolaryngology

## 2016-01-22 DIAGNOSIS — R519 Headache, unspecified: Secondary | ICD-10-CM

## 2016-01-22 DIAGNOSIS — R51 Headache: Secondary | ICD-10-CM

## 2016-01-22 DIAGNOSIS — H9201 Otalgia, right ear: Secondary | ICD-10-CM | POA: Diagnosis not present

## 2016-01-22 DIAGNOSIS — R42 Dizziness and giddiness: Secondary | ICD-10-CM

## 2016-01-22 IMAGING — MR MR HEAD WO/W CM
10 of 11 series · 41 of 48 positions shown · IV contrast (Multihance 14ml)
Comparison: 11/26/2015 CT

CLINICAL DATA: Pain in the right ear. Disequilibrium. Symptoms
began [REDACTED].

Creatinine was obtained on site at [HOSPITAL] at [HOSPITAL].
Results: Creatinine 0.9 mg/dL.
EXAM:
MRI HEAD WITHOUT AND WITH CONTRAST
TECHNIQUE: Multiplanar, multiecho pulse sequences of the brain and surrounding
structures were obtained without and with intravenous contrast.
CONTRAST:  14mL MULTIHANCE GADOBENATE DIMEGLUMINE 529 MG/ML IV SOLN

[Series 5: T1 · sagittal · 4.0mm · 0.72mm/px · 2 of 26 slices shown (1 of 3)]
[im 1/26]
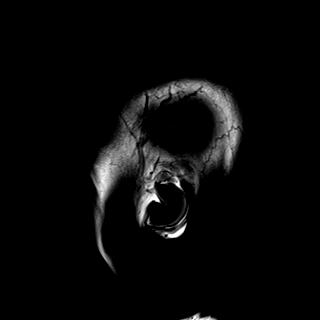
[im 26/26]
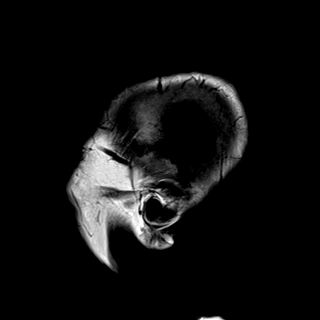

[Series 6: DWI · axial · 3.0mm · 1.44mm/px · z∈[-45,+99]mm · 7 of 76 slices shown (1 of 2)]
[im 1/76]
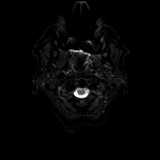
[im 13/76]
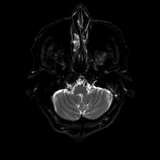
[im 26/76]
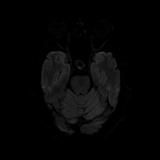
[im 38/76]
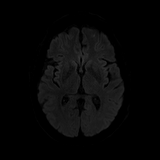
[im 51/76]
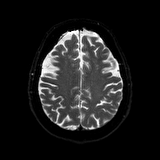
[im 63/76]
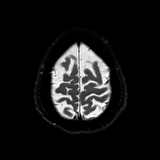
[im 76/76]
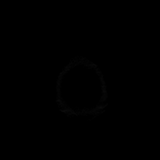

[Series 7: DWI · axial · 3.0mm · 1.44mm/px · z∈[-45,+99]mm · 3 of 37 slices shown (2 of 2)]
[im 1/37]
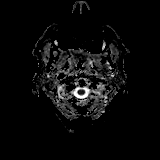
[im 19/37]
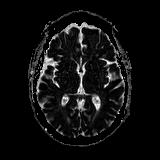
[im 37/37]
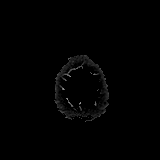

[Series 8: T2 · axial · 4.0mm · 0.36mm/px · z∈[-40,+95]mm · 3 of 27 slices shown]
[im 1/27]
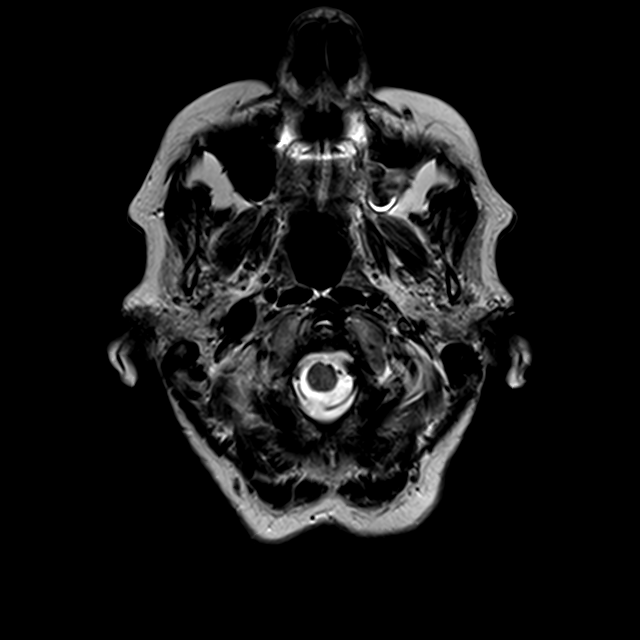
[im 14/27]
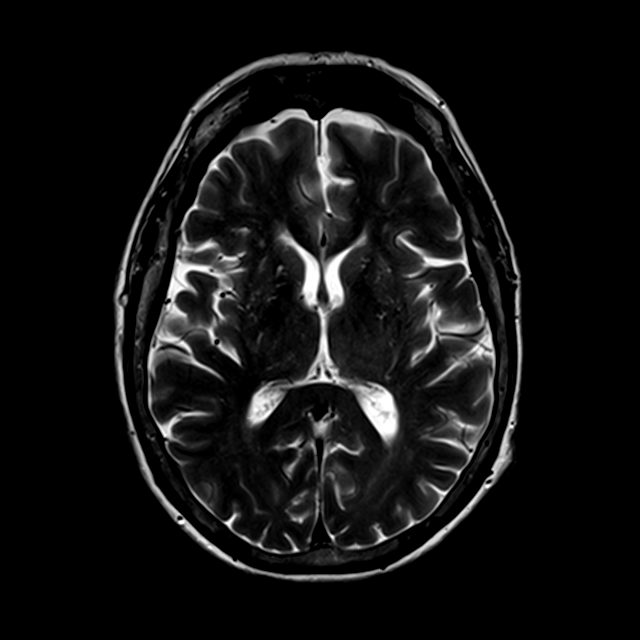
[im 27/27]
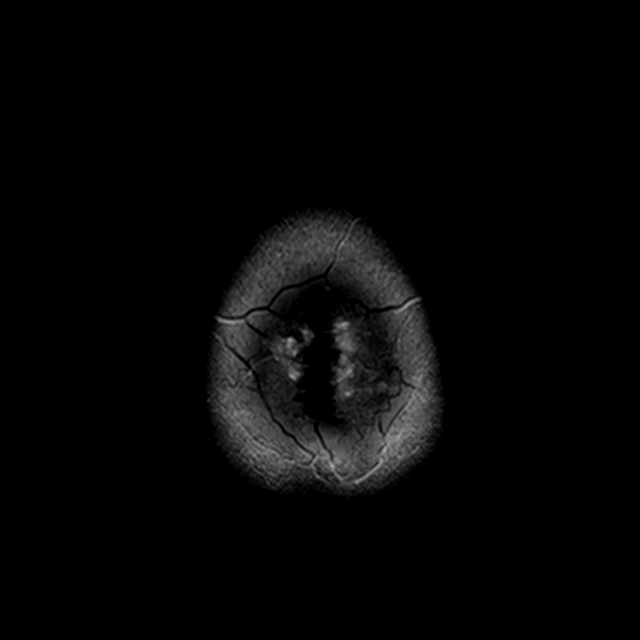

[Series 9: FLAIR · axial · 4.0mm · 0.72mm/px · z∈[-40,+95]mm · 3 of 27 slices shown]
[im 1/27]
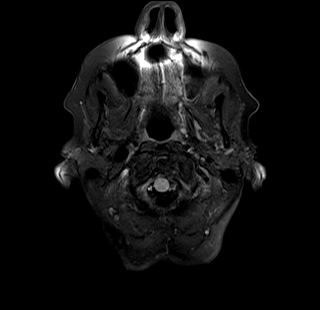
[im 14/27]
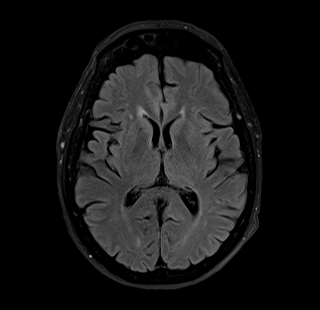
[im 27/27]
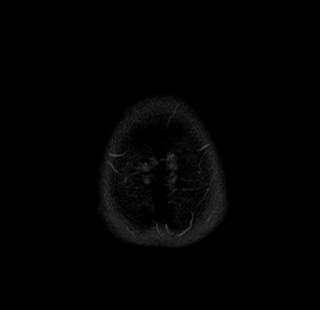

[Series 10: T1 · coronal · 2.5mm · 0.56mm/px · 2 of 15 slices shown (2 of 3)]
[im 1/15]
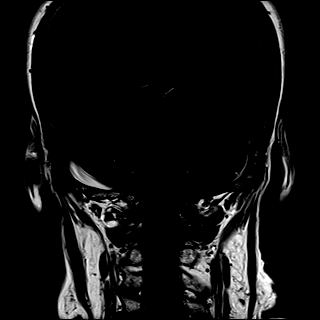
[im 15/15]
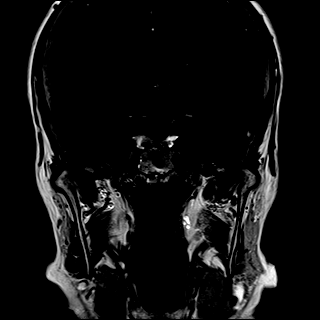

[Series 11: T1 · axial · 2.5mm · 0.59mm/px · z∈[-41,-4]mm · 2 of 15 slices shown (3 of 3)]
[im 1/15]
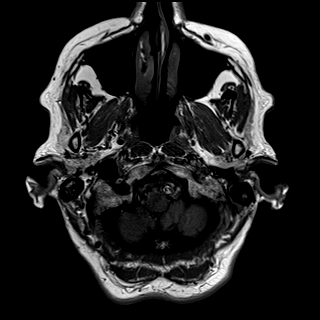
[im 15/15]
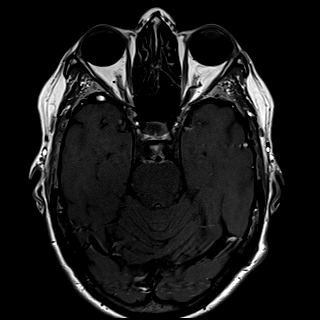

[Series 13: T1 post-contrast · coronal · 2.5mm · 0.56mm/px · 2 of 15 slices shown (1 of 3)]
[im 1/15]
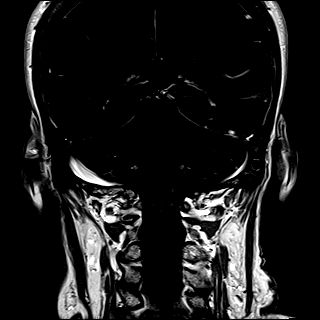
[im 15/15]
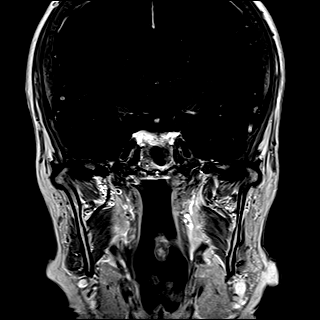

[Series 14: T1 post-contrast · axial · 2.5mm · 0.59mm/px · z∈[-41,-4]mm · 2 of 15 slices shown (2 of 3)]
[im 1/15]
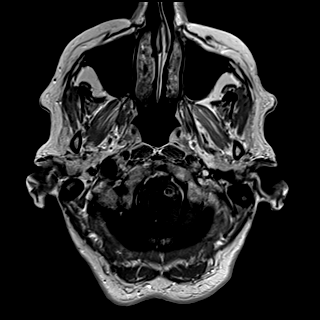
[im 15/15]
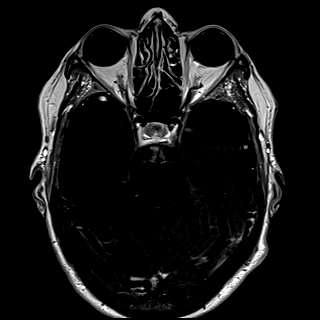

[Series 15: T1 post-contrast · axial · 1.0mm · 0.90mm/px · z∈[-44,+99]mm · 15 of 144 slices shown (3 of 3)]
[im 1/144]
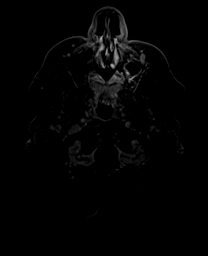
[im 11/144]
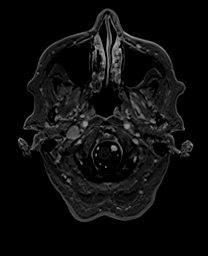
[im 21/144]
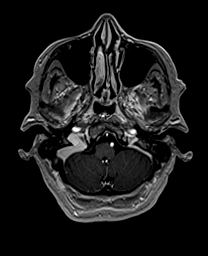
[im 31/144]
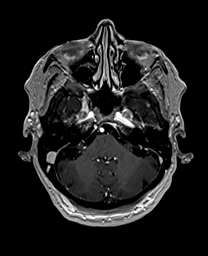
[im 41/144]
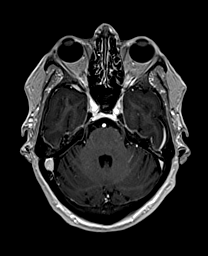
[im 52/144]
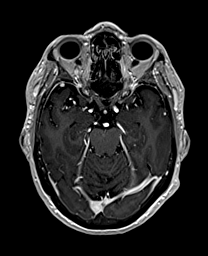
[im 62/144]
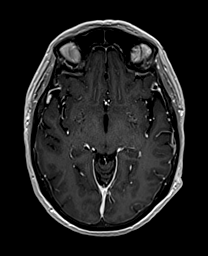
[im 72/144]
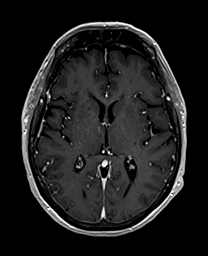
[im 82/144]
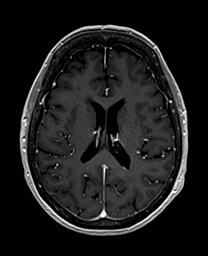
[im 92/144]
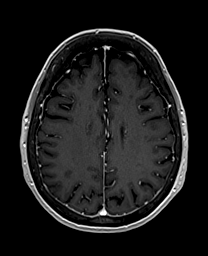
[im 103/144]
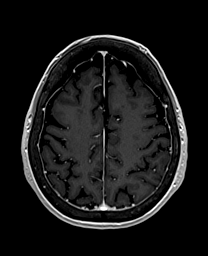
[im 113/144]
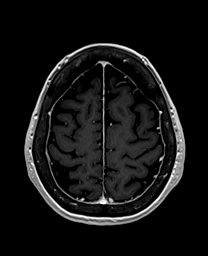
[im 123/144]
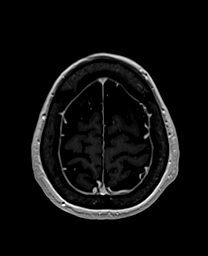
[im 133/144]
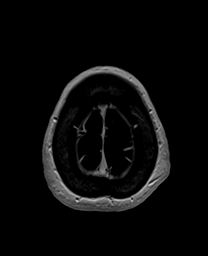
[im 144/144]
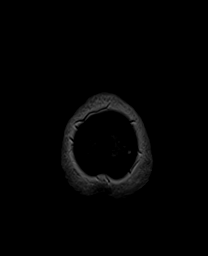

[41 of 48 positions shown; findings below may reference images not displayed]

FINDINGS: Diffusion imaging does not show any acute or subacute infarction.
The brainstem and cerebellum are normal. Cerebral hemispheres show
mild chronic small-vessel ischemic change within the deep and
subcortical white matter, fairly typical for age. No cortical or
large vessel territory infarction. No intra-axial mass lesion,
hemorrhage, hydrocephalus or extra-axial collection. CP angle
regions are normal. No vestibular schwannoma. No abnormal brain
enhancement occurs. Paranasal sinuses are clear. There are mastoid
effusions, more on the left than the right. Major vessels at the
base of the brain show flow.
IMPRESSION: No cause of the presenting symptoms is identified. Mild chronic
small-vessel ischemic change of the cerebral hemispheric white
matter, fairly typical for age. No vestibular schwannoma.

Small mastoid effusions, left more than right.

## 2016-01-22 MED ORDER — GADOBENATE DIMEGLUMINE 529 MG/ML IV SOLN
14.0000 mL | Freq: Once | INTRAVENOUS | Status: AC | PRN
  Administered 2016-01-22: 14 mL via INTRAVENOUS

## 2016-01-25 DIAGNOSIS — H04123 Dry eye syndrome of bilateral lacrimal glands: Secondary | ICD-10-CM | POA: Diagnosis not present

## 2016-01-25 DIAGNOSIS — H259 Unspecified age-related cataract: Secondary | ICD-10-CM | POA: Diagnosis not present

## 2016-01-27 DIAGNOSIS — I493 Ventricular premature depolarization: Secondary | ICD-10-CM | POA: Diagnosis not present

## 2016-01-27 DIAGNOSIS — E038 Other specified hypothyroidism: Secondary | ICD-10-CM | POA: Diagnosis not present

## 2016-01-27 DIAGNOSIS — I10 Essential (primary) hypertension: Secondary | ICD-10-CM | POA: Diagnosis not present

## 2016-01-27 DIAGNOSIS — R002 Palpitations: Secondary | ICD-10-CM | POA: Insufficient documentation

## 2016-01-27 DIAGNOSIS — E876 Hypokalemia: Secondary | ICD-10-CM | POA: Diagnosis not present

## 2016-02-08 DIAGNOSIS — Z96652 Presence of left artificial knee joint: Secondary | ICD-10-CM | POA: Diagnosis not present

## 2016-02-08 DIAGNOSIS — M7061 Trochanteric bursitis, right hip: Secondary | ICD-10-CM | POA: Diagnosis not present

## 2016-02-08 DIAGNOSIS — Z471 Aftercare following joint replacement surgery: Secondary | ICD-10-CM | POA: Diagnosis not present

## 2016-02-09 DIAGNOSIS — R32 Unspecified urinary incontinence: Secondary | ICD-10-CM | POA: Diagnosis not present

## 2016-02-09 DIAGNOSIS — N390 Urinary tract infection, site not specified: Secondary | ICD-10-CM | POA: Diagnosis not present

## 2016-02-09 DIAGNOSIS — R002 Palpitations: Secondary | ICD-10-CM | POA: Diagnosis not present

## 2016-02-13 DIAGNOSIS — Z87891 Personal history of nicotine dependence: Secondary | ICD-10-CM | POA: Diagnosis not present

## 2016-02-13 DIAGNOSIS — R350 Frequency of micturition: Secondary | ICD-10-CM | POA: Diagnosis not present

## 2016-02-13 DIAGNOSIS — R3915 Urgency of urination: Secondary | ICD-10-CM | POA: Diagnosis not present

## 2016-02-13 DIAGNOSIS — E079 Disorder of thyroid, unspecified: Secondary | ICD-10-CM | POA: Diagnosis not present

## 2016-02-13 DIAGNOSIS — R110 Nausea: Secondary | ICD-10-CM | POA: Diagnosis not present

## 2016-02-13 DIAGNOSIS — I10 Essential (primary) hypertension: Secondary | ICD-10-CM | POA: Diagnosis not present

## 2016-02-16 DIAGNOSIS — R350 Frequency of micturition: Secondary | ICD-10-CM | POA: Diagnosis not present

## 2016-02-16 DIAGNOSIS — N390 Urinary tract infection, site not specified: Secondary | ICD-10-CM | POA: Diagnosis not present

## 2016-02-22 DIAGNOSIS — R002 Palpitations: Secondary | ICD-10-CM | POA: Diagnosis not present

## 2016-02-22 DIAGNOSIS — E876 Hypokalemia: Secondary | ICD-10-CM | POA: Diagnosis not present

## 2016-02-22 DIAGNOSIS — I493 Ventricular premature depolarization: Secondary | ICD-10-CM | POA: Diagnosis not present

## 2016-02-24 DIAGNOSIS — E890 Postprocedural hypothyroidism: Secondary | ICD-10-CM | POA: Diagnosis not present

## 2016-02-24 DIAGNOSIS — I493 Ventricular premature depolarization: Secondary | ICD-10-CM | POA: Diagnosis not present

## 2016-02-24 DIAGNOSIS — I491 Atrial premature depolarization: Secondary | ICD-10-CM | POA: Diagnosis not present

## 2016-02-24 DIAGNOSIS — R002 Palpitations: Secondary | ICD-10-CM | POA: Diagnosis not present

## 2016-02-24 DIAGNOSIS — I10 Essential (primary) hypertension: Secondary | ICD-10-CM | POA: Diagnosis not present

## 2016-02-26 DIAGNOSIS — Z8371 Family history of colonic polyps: Secondary | ICD-10-CM | POA: Diagnosis not present

## 2016-02-26 DIAGNOSIS — R1314 Dysphagia, pharyngoesophageal phase: Secondary | ICD-10-CM | POA: Diagnosis not present

## 2016-02-26 DIAGNOSIS — K5901 Slow transit constipation: Secondary | ICD-10-CM | POA: Diagnosis not present

## 2016-03-03 DIAGNOSIS — R1310 Dysphagia, unspecified: Secondary | ICD-10-CM | POA: Diagnosis not present

## 2016-03-03 DIAGNOSIS — R634 Abnormal weight loss: Secondary | ICD-10-CM | POA: Diagnosis not present

## 2016-03-10 DIAGNOSIS — Z96652 Presence of left artificial knee joint: Secondary | ICD-10-CM | POA: Diagnosis not present

## 2016-03-10 DIAGNOSIS — Z471 Aftercare following joint replacement surgery: Secondary | ICD-10-CM | POA: Diagnosis not present

## 2016-03-10 DIAGNOSIS — M7061 Trochanteric bursitis, right hip: Secondary | ICD-10-CM | POA: Diagnosis not present

## 2016-03-10 DIAGNOSIS — M25551 Pain in right hip: Secondary | ICD-10-CM | POA: Diagnosis not present

## 2016-03-14 ENCOUNTER — Ambulatory Visit (INDEPENDENT_AMBULATORY_CARE_PROVIDER_SITE_OTHER): Payer: PPO | Admitting: Family Medicine

## 2016-03-14 ENCOUNTER — Encounter: Payer: Self-pay | Admitting: Family Medicine

## 2016-03-14 VITALS — BP 165/93 | HR 76 | Ht 64.0 in | Wt 150.8 lb

## 2016-03-14 DIAGNOSIS — M2141 Flat foot [pes planus] (acquired), right foot: Secondary | ICD-10-CM

## 2016-03-14 DIAGNOSIS — M25551 Pain in right hip: Secondary | ICD-10-CM | POA: Diagnosis not present

## 2016-03-14 DIAGNOSIS — M2142 Flat foot [pes planus] (acquired), left foot: Secondary | ICD-10-CM | POA: Diagnosis not present

## 2016-03-14 DIAGNOSIS — N390 Urinary tract infection, site not specified: Secondary | ICD-10-CM | POA: Diagnosis not present

## 2016-03-14 DIAGNOSIS — R269 Unspecified abnormalities of gait and mobility: Secondary | ICD-10-CM

## 2016-03-14 DIAGNOSIS — R350 Frequency of micturition: Secondary | ICD-10-CM | POA: Diagnosis not present

## 2016-03-14 DIAGNOSIS — E039 Hypothyroidism, unspecified: Secondary | ICD-10-CM | POA: Diagnosis not present

## 2016-03-14 MED ORDER — METHYLPREDNISOLONE ACETATE 40 MG/ML IJ SUSP
40.0000 mg | Freq: Once | INTRAMUSCULAR | Status: AC
  Administered 2016-03-14: 40 mg via INTRA_ARTICULAR

## 2016-03-14 NOTE — Patient Instructions (Signed)
You have trochanteric bursitis Avoid painful activities as much as possible. Ice over area of pain 3-4 times a day for 15 minutes at a time Hip side raise exercise 3 sets of 10-15 once a day - add weights if this becomes too easy. Stretches - pick 2 and hold for 20-30 seconds x 3 - do once or twice a day. Start physical therapy as you're scheduled to. If not improving, can consider physical therapy and/or steroid injection.  You also have hip arthritis. These are the 4 classes of medication you can take for this: Tylenol 500mg  1-2 tabs three times a day for pain. Glucosamine sulfate 750mg  twice a day is a supplement that may help. Capsaicin, aspercreme, or biofreeze topically up to four times a day may also help with pain. Ibuprofen as needed (minimize how much you use this). Cortisone injections into the joint are an option. It's important that you continue to stay active. Straight leg raises, knee extensions 3 sets of 10 once a day (add ankle weight if these become too easy). Consider physical therapy to strengthen muscles around the joint that hurts to take pressure off of the joint itself. Shoe inserts with good arch support may be helpful (we made you orthotics today). Walker or cane if needed. Heat or ice 15 minutes at a time 3-4 times a day as needed to help with pain. Water aerobics and cycling with low resistance are the best two types of exercise for arthritis. Let me know in a week how you're doing with the orthotics and if we need to make any adjustments.

## 2016-03-15 DIAGNOSIS — M1611 Unilateral primary osteoarthritis, right hip: Secondary | ICD-10-CM | POA: Diagnosis not present

## 2016-03-15 DIAGNOSIS — M25551 Pain in right hip: Secondary | ICD-10-CM | POA: Diagnosis not present

## 2016-03-15 DIAGNOSIS — M25651 Stiffness of right hip, not elsewhere classified: Secondary | ICD-10-CM | POA: Diagnosis not present

## 2016-03-16 ENCOUNTER — Ambulatory Visit (INDEPENDENT_AMBULATORY_CARE_PROVIDER_SITE_OTHER): Payer: PPO | Admitting: Family Medicine

## 2016-03-16 DIAGNOSIS — R269 Unspecified abnormalities of gait and mobility: Secondary | ICD-10-CM | POA: Insufficient documentation

## 2016-03-16 DIAGNOSIS — M2142 Flat foot [pes planus] (acquired), left foot: Secondary | ICD-10-CM

## 2016-03-16 DIAGNOSIS — M25551 Pain in right hip: Secondary | ICD-10-CM | POA: Insufficient documentation

## 2016-03-16 DIAGNOSIS — M2141 Flat foot [pes planus] (acquired), right foot: Secondary | ICD-10-CM

## 2016-03-16 DIAGNOSIS — M2140 Flat foot [pes planus] (acquired), unspecified foot: Secondary | ICD-10-CM | POA: Insufficient documentation

## 2016-03-16 NOTE — Assessment & Plan Note (Signed)
2/2 combination of arthritis and trochanteric bursitis.  Shown home exercises and stretches to do daily.  Given injection for bursitis today.  Will start physical therapy tomorrow.  We discussed tylenol, icing, glucosamine, topical medications, topical vioxx (minimize oral nsaids).  Custom orthotics made as well.  After informed written consent patient was lying on left side on exam table.  Area of maximal pain over right trochanteric bursa prepped with alcohol swab then injected with 6:2 marcaine: depomedrol.  Patient tolerated procedure well without immediate complications.  Patient was fitted for a : standard, cushioned, semi-rigid orthotic. The orthotic was heated and afterward the patient stood on the orthotic blank positioned on the orthotic stand. The patient was positioned in subtalar neutral position and 10 degrees of ankle dorsiflexion in a weight bearing stance. After completion of molding, a stable base was applied to the orthotic blank. The blank was ground to a stable position for weight bearing. Size: 10 blue swirl Base: blue med density eva Posting: none Additional orthotic padding: none Total visit time 60 minutes

## 2016-03-16 NOTE — Progress Notes (Signed)
PCP: Precious Reel, MD  Subjective:   HPI: Patient is a 80 y.o. female here for right hip pain, orthotics.  Patient reports she was given custom orthotics about 20 years ago which seemed to help her right hip pain. She has had the pain in right hip come back over past month. Pain is lateral and within right groin. Pain level 0/10 at rest but can be severe, sharp especially with lying on right side, walking. No pain in feet, ankles. Tried topical vioxx. Due to start physical therapy tomorrow. No skin changes, numbness. Has had injections for bursitis in the past for this hip.  Past Medical History:  Diagnosis Date  . Arthritis   . History of skin cancer   . Hypertension   . PVC (premature ventricular contraction)   . Thyroiditis 1973    Current Outpatient Prescriptions on File Prior to Visit  Medication Sig Dispense Refill  . cholecalciferol (VITAMIN D) 1000 units tablet Take 1,000 Units by mouth daily.    Marland Kitchen docusate sodium (COLACE) 100 MG capsule Take 1 capsule (100 mg total) by mouth 2 (two) times daily. 10 capsule 0  . ferrous sulfate 325 (65 FE) MG tablet Take 1 tablet (325 mg total) by mouth 3 (three) times daily after meals.  3  . hydrochlorothiazide 25 MG tablet Take 25 mg by mouth daily.      Marland Kitchen orlistat (ALLI) 60 MG capsule Take 60 mg by mouth every morning.     . rivaroxaban (XARELTO) 10 MG TABS tablet Take 1 tablet (10 mg total) by mouth daily. 14 tablet 0  . tiZANidine (ZANAFLEX) 4 MG tablet Take 1 tablet (4 mg total) by mouth every 6 (six) hours as needed for muscle spasms. 40 tablet 0  . TURMERIC PO Take 1 tablet by mouth daily.     No current facility-administered medications on file prior to visit.     Past Surgical History:  Procedure Laterality Date  . ABDOMINAL HYSTERECTOMY  89   TAH BSO  . ELBOW SURGERY    . KNEE ARTHROSCOPY     left  . THYROIDECTOMY  1973  . TOTAL KNEE ARTHROPLASTY Left 09/28/2015   Procedure: LEFT TOTAL KNEE ARTHROPLASTY;  Surgeon:  Paralee Cancel, MD;  Location: [facility];  Service: Orthopedics;  Laterality: Left;    Allergies  Allergen Reactions  . Epinephrine     Tachcardyia, chest pains tremors  . Tenormin [Atenolol] Rash    Social History   Social History  . Marital status: Married    Spouse name: N/A  . Number of children: N/A  . Years of education: N/A   Occupational History  . Not on file.   Social History Main Topics  . Smoking status: Former Smoker    Quit date: 07/26/1987  . Smokeless tobacco: Never Used  . Alcohol use No  . Drug use: No  . Sexual activity: Not Currently     Comment: 1st intercourse 42 yo-1 partner   Other Topics Concern  . Not on file   Social History Narrative  . No narrative on file    Family History  Problem Relation Age of Onset  . Diabetes Father   . Hypertension Father     BP (!) 165/93   Pulse 76   Ht 5\' 4"  (1.626 m)   Wt 150 lb 12.8 oz (68.4 kg)   LMP 07/25/1988   BMI 25.88 kg/m   Review of Systems: See HPI above.    Objective:  Physical Exam:  Gen: NAD, comfortable in exam room  Right hip: No gross deformity, swelling, bruising. TTP over greater trochanter.  No other tenderness. Only 10 degrees IR compared to 20 on left, painful. Strength 5/5 all motions except 3/5 with hip abduction. Pain in groin on attempted fabers, piriformis stretching. NVI distally.  Bilateral feet/ankles: Pes planus. Transverse arch collapse R > L with spacing between digits. Leg lengths equal. No hallux valgus. FROM ankles without pain. No TTP NV intact distally.    Assessment & Plan:  1. Right hip pain - 2/2 combination of arthritis and trochanteric bursitis.  Shown home exercises and stretches to do daily.  Given injection for bursitis today.  Will start physical therapy tomorrow.  We discussed tylenol, icing, glucosamine, topical medications, topical vioxx (minimize oral nsaids).  Custom orthotics made as well.  After informed written consent patient was  lying on left side on exam table.  Area of maximal pain over right trochanteric bursa prepped with alcohol swab then injected with 6:2 marcaine: depomedrol.  Patient tolerated procedure well without immediate complications.  Patient was fitted for a : standard, cushioned, semi-rigid orthotic. The orthotic was heated and afterward the patient stood on the orthotic blank positioned on the orthotic stand. The patient was positioned in subtalar neutral position and 10 degrees of ankle dorsiflexion in a weight bearing stance. After completion of molding, a stable base was applied to the orthotic blank. The blank was ground to a stable position for weight bearing. Size: 10 blue swirl Base: blue med density eva Posting: none Additional orthotic padding: none Total visit time 50 minutes

## 2016-03-17 DIAGNOSIS — M25651 Stiffness of right hip, not elsewhere classified: Secondary | ICD-10-CM | POA: Diagnosis not present

## 2016-03-17 DIAGNOSIS — M25551 Pain in right hip: Secondary | ICD-10-CM | POA: Diagnosis not present

## 2016-03-17 DIAGNOSIS — M1611 Unilateral primary osteoarthritis, right hip: Secondary | ICD-10-CM | POA: Diagnosis not present

## 2016-03-17 NOTE — Progress Notes (Signed)
Patient came in today with issues with right orthotic.  Reports lump in middle of arch, would like more curl to posterior aspect of orthotic as it is relatively flat across.  She is both concerned about foot rolling in too much but also about having too much arch support that it puts pressure on inside of arch.  We discussed some of these are competing complaints (especially the latter one noted above).  Went ahead and made her a completely new right orthotic that felt better in the office.  Some concern she was too far forward on other right orthotic we made - today's looked more molded posteriorly.

## 2016-03-21 ENCOUNTER — Ambulatory Visit (INDEPENDENT_AMBULATORY_CARE_PROVIDER_SITE_OTHER): Payer: PPO | Admitting: Family Medicine

## 2016-03-21 DIAGNOSIS — M25651 Stiffness of right hip, not elsewhere classified: Secondary | ICD-10-CM | POA: Diagnosis not present

## 2016-03-21 DIAGNOSIS — M1611 Unilateral primary osteoarthritis, right hip: Secondary | ICD-10-CM | POA: Diagnosis not present

## 2016-03-21 DIAGNOSIS — M25551 Pain in right hip: Secondary | ICD-10-CM | POA: Diagnosis not present

## 2016-03-21 DIAGNOSIS — R269 Unspecified abnormalities of gait and mobility: Secondary | ICD-10-CM

## 2016-03-21 NOTE — Patient Instructions (Signed)
Spenco is the brand of orthotic I would consider if these custom ones don't work.

## 2016-03-22 ENCOUNTER — Telehealth: Payer: Self-pay | Admitting: Family Medicine

## 2016-03-22 NOTE — Telephone Encounter (Signed)
Like I told her when we saw her yesterday I think that was the last adjustment to try - this is the second pair of custom orthotics we have tried and made adjustments on and she just doesn't tolerate them - I don't think a third set will be any different.  I encouraged her at that visit if they're not working to consider trying something like spenco orthotics.  We even tried her in our green insoles with scaphoid pads and without scaphoid pads and she didn't tolerate these - said they bothered her too much.

## 2016-03-23 DIAGNOSIS — M25651 Stiffness of right hip, not elsewhere classified: Secondary | ICD-10-CM | POA: Diagnosis not present

## 2016-03-23 DIAGNOSIS — M1611 Unilateral primary osteoarthritis, right hip: Secondary | ICD-10-CM | POA: Diagnosis not present

## 2016-03-23 DIAGNOSIS — M25551 Pain in right hip: Secondary | ICD-10-CM | POA: Diagnosis not present

## 2016-03-23 NOTE — Progress Notes (Signed)
Patient returned today to adjust orthotics.  She reports right orthotic feels like the heel is too high and her foot is coming out of shoe.  Also feels small ridges within arch of orthotic she would like smoothed out.  We adjusted the ridges.  I placed left and right side by side to illustrate they are the same height as one another - advised against grinding down the heel and that she will break these in over time.  Advised if she still has problems she may want to consider spenco orthotics.  She had tried our sports insoles with and without scaphoid pads and felt uncomfortable with both.

## 2016-03-29 DIAGNOSIS — E039 Hypothyroidism, unspecified: Secondary | ICD-10-CM | POA: Diagnosis not present

## 2016-03-30 DIAGNOSIS — M25551 Pain in right hip: Secondary | ICD-10-CM | POA: Diagnosis not present

## 2016-03-30 DIAGNOSIS — M25651 Stiffness of right hip, not elsewhere classified: Secondary | ICD-10-CM | POA: Diagnosis not present

## 2016-03-30 DIAGNOSIS — M1611 Unilateral primary osteoarthritis, right hip: Secondary | ICD-10-CM | POA: Diagnosis not present

## 2016-04-01 DIAGNOSIS — R5383 Other fatigue: Secondary | ICD-10-CM | POA: Diagnosis not present

## 2016-04-01 DIAGNOSIS — Z1389 Encounter for screening for other disorder: Secondary | ICD-10-CM | POA: Diagnosis not present

## 2016-04-01 DIAGNOSIS — E038 Other specified hypothyroidism: Secondary | ICD-10-CM | POA: Diagnosis not present

## 2016-04-01 DIAGNOSIS — I951 Orthostatic hypotension: Secondary | ICD-10-CM | POA: Diagnosis not present

## 2016-04-01 DIAGNOSIS — Z6826 Body mass index (BMI) 26.0-26.9, adult: Secondary | ICD-10-CM | POA: Diagnosis not present

## 2016-04-01 DIAGNOSIS — I10 Essential (primary) hypertension: Secondary | ICD-10-CM | POA: Diagnosis not present

## 2016-04-01 DIAGNOSIS — E668 Other obesity: Secondary | ICD-10-CM | POA: Diagnosis not present

## 2016-04-01 DIAGNOSIS — M1990 Unspecified osteoarthritis, unspecified site: Secondary | ICD-10-CM | POA: Diagnosis not present

## 2016-04-04 DIAGNOSIS — M25551 Pain in right hip: Secondary | ICD-10-CM | POA: Diagnosis not present

## 2016-04-04 DIAGNOSIS — M1611 Unilateral primary osteoarthritis, right hip: Secondary | ICD-10-CM | POA: Diagnosis not present

## 2016-04-04 DIAGNOSIS — M25651 Stiffness of right hip, not elsewhere classified: Secondary | ICD-10-CM | POA: Diagnosis not present

## 2016-04-05 DIAGNOSIS — Z471 Aftercare following joint replacement surgery: Secondary | ICD-10-CM | POA: Diagnosis not present

## 2016-04-05 DIAGNOSIS — M545 Low back pain: Secondary | ICD-10-CM | POA: Diagnosis not present

## 2016-04-05 DIAGNOSIS — M7061 Trochanteric bursitis, right hip: Secondary | ICD-10-CM | POA: Diagnosis not present

## 2016-04-05 DIAGNOSIS — M5416 Radiculopathy, lumbar region: Secondary | ICD-10-CM | POA: Diagnosis not present

## 2016-04-05 DIAGNOSIS — Z96652 Presence of left artificial knee joint: Secondary | ICD-10-CM | POA: Diagnosis not present

## 2016-04-07 DIAGNOSIS — R51 Headache: Secondary | ICD-10-CM | POA: Diagnosis not present

## 2016-04-07 DIAGNOSIS — R002 Palpitations: Secondary | ICD-10-CM | POA: Diagnosis not present

## 2016-04-07 DIAGNOSIS — Z87891 Personal history of nicotine dependence: Secondary | ICD-10-CM | POA: Diagnosis not present

## 2016-04-07 DIAGNOSIS — I10 Essential (primary) hypertension: Secondary | ICD-10-CM | POA: Diagnosis not present

## 2016-04-07 DIAGNOSIS — E079 Disorder of thyroid, unspecified: Secondary | ICD-10-CM | POA: Diagnosis not present

## 2016-04-11 DIAGNOSIS — I10 Essential (primary) hypertension: Secondary | ICD-10-CM | POA: Diagnosis not present

## 2016-04-12 DIAGNOSIS — Z96652 Presence of left artificial knee joint: Secondary | ICD-10-CM | POA: Diagnosis not present

## 2016-04-12 DIAGNOSIS — M25551 Pain in right hip: Secondary | ICD-10-CM | POA: Diagnosis not present

## 2016-04-12 DIAGNOSIS — M7061 Trochanteric bursitis, right hip: Secondary | ICD-10-CM | POA: Diagnosis not present

## 2016-04-12 DIAGNOSIS — Z471 Aftercare following joint replacement surgery: Secondary | ICD-10-CM | POA: Diagnosis not present

## 2016-04-14 NOTE — Progress Notes (Signed)
Coming in for pre op/ please add orders  In epic  thanks

## 2016-04-15 DIAGNOSIS — I10 Essential (primary) hypertension: Secondary | ICD-10-CM | POA: Diagnosis not present

## 2016-04-19 DIAGNOSIS — Z6826 Body mass index (BMI) 26.0-26.9, adult: Secondary | ICD-10-CM | POA: Diagnosis not present

## 2016-04-19 DIAGNOSIS — E038 Other specified hypothyroidism: Secondary | ICD-10-CM | POA: Diagnosis not present

## 2016-04-19 DIAGNOSIS — I10 Essential (primary) hypertension: Secondary | ICD-10-CM | POA: Diagnosis not present

## 2016-04-19 DIAGNOSIS — E668 Other obesity: Secondary | ICD-10-CM | POA: Diagnosis not present

## 2016-04-19 DIAGNOSIS — R5383 Other fatigue: Secondary | ICD-10-CM | POA: Diagnosis not present

## 2016-04-19 DIAGNOSIS — R42 Dizziness and giddiness: Secondary | ICD-10-CM | POA: Diagnosis not present

## 2016-04-19 DIAGNOSIS — E784 Other hyperlipidemia: Secondary | ICD-10-CM | POA: Diagnosis not present

## 2016-04-19 DIAGNOSIS — M1990 Unspecified osteoarthritis, unspecified site: Secondary | ICD-10-CM | POA: Diagnosis not present

## 2016-04-19 DIAGNOSIS — R002 Palpitations: Secondary | ICD-10-CM | POA: Diagnosis not present

## 2016-04-28 NOTE — H&P (Signed)
TOTAL HIP ADMISSION H&P  Patient is admitted for right total hip arthroplasty, anterior approach.  Subjective:  Chief Complaint:    Right hip primary OA / pain  HPI: Heidi Adams, 80 y.o. female, has a history of pain and functional disability in the right hip(s) due to arthritis and patient has failed non-surgical conservative treatments for greater than 12 weeks to include NSAID's and/or analgesics, corticosteriod injections, use of assistive devices and activity modification.  Onset of symptoms was gradual starting 2+ years ago with gradually worsening course since that time.The patient noted no past surgery on the right hip(s).  Patient currently rates pain in the right hip at 10 out of 10 with activity. Patient has night pain, worsening of pain with activity and weight bearing, trendelenberg gait, pain that interfers with activities of daily living and pain with passive range of motion. Patient has evidence of periarticular osteophytes and joint space narrowing by imaging studies. This condition presents safety issues increasing the risk of falls.  There is no current active infection.   Risks, benefits and expectations were discussed with the patient.  Risks including but not limited to the risk of anesthesia, blood clots, nerve damage, blood vessel damage, failure of the prosthesis, infection and up to and including death.  Patient understand the risks, benefits and expectations and wishes to proceed with surgery.   PCP: Precious Reel, MD  D/C Plans:      Home with HHPT  Post-op Meds:       No Rx given  Tranexamic Acid:      To be given - IV   Decadron:      Is to be given  FYI:     ASA  Percocet (ok per patient) also able to use tramadol   Patient Active Problem List   Diagnosis Date Noted  . Right hip pain 03/16/2016  . Pes planus 03/16/2016  . Abnormality of gait 03/16/2016  . PAC (premature atrial contraction) 02/24/2016  . PVC (premature ventricular contraction) 02/24/2016   . Palpitations 01/27/2016  . Benign paroxysmal positional vertigo of right ear 12/15/2015  . Pharyngoesophageal dysphagia 12/15/2015  . Hypothyroidism 11/26/2015  . Essential hypertension 11/26/2015  . S/P left TKA 09/28/2015  . S/P knee replacement 09/28/2015  . Lichen sclerosus [id number]   Past Medical History:  Diagnosis Date  . Arthritis   . History of skin cancer   . Hypertension   . PVC (premature ventricular contraction)   . Thyroiditis 1973    Past Surgical History:  Procedure Laterality Date  . ABDOMINAL HYSTERECTOMY  90   TAH BSO  . ELBOW SURGERY    . KNEE ARTHROSCOPY     left  . THYROIDECTOMY  1973  . TOTAL KNEE ARTHROPLASTY Left 09/28/2015   Procedure: LEFT TOTAL KNEE ARTHROPLASTY;  Surgeon: Paralee Cancel, MD;  Location: [facility];  Service: Orthopedics;  Laterality: Left;    No prescriptions prior to admission.   Allergies  Allergen Reactions  . Epinephrine     Tachcardyia, chest pains tremors  . Tenormin [Atenolol] Rash    Social History  Substance Use Topics  . Smoking status: Former Smoker    Quit date: 07/26/1987  . Smokeless tobacco: Never Used  . Alcohol use No    Family History  Problem Relation Age of Onset  . Diabetes Father   . Hypertension Father      Review of Systems  Constitutional: Negative.   HENT: Negative.   Eyes: Negative.   Respiratory: Negative.  Cardiovascular: Negative.   Gastrointestinal: Negative.   Genitourinary: Negative.   Musculoskeletal: Positive for joint pain.  Skin: Negative.   Neurological: Negative.   Endo/Heme/Allergies: Negative.   Psychiatric/Behavioral: Negative.     Objective:  Physical Exam  Constitutional: She is oriented to person, place, and time. She appears well-developed.  HENT:  Head: Normocephalic.  Eyes: Pupils are equal, round, and reactive to light.  Neck: Neck supple. No JVD present. No tracheal deviation present. No thyromegaly present.  Cardiovascular: Normal rate, regular rhythm,  normal heart sounds and intact distal pulses.   Respiratory: Effort normal and breath sounds normal. No respiratory distress. She has no wheezes.  GI: Soft. There is no tenderness. There is no guarding.  Musculoskeletal:       Right hip: She exhibits decreased range of motion, decreased strength, tenderness and bony tenderness. She exhibits no swelling, no deformity and no laceration.  Lymphadenopathy:    She has no cervical adenopathy.  Neurological: She is alert and oriented to person, place, and time.  Skin: Skin is warm and dry.  Psychiatric: She has a normal mood and affect.      Labs:  Estimated body mass index is 25.88 kg/m as calculated from the following:   Height as of 03/14/16: 5\' 4"  (1.626 m).   Weight as of 03/14/16: 68.4 kg (150 lb 12.8 oz).   Imaging Review Plain radiographs demonstrate severe degenerative joint disease of the right hip(s). The bone quality appears to be good for age and reported activity level.  Assessment/Plan:  End stage arthritis, right hip(s)  The patient history, physical examination, clinical judgement of the provider and imaging studies are consistent with end stage degenerative joint disease of the right hip(s) and total hip arthroplasty is deemed medically necessary. The treatment options including medical management, injection therapy, arthroscopy and arthroplasty were discussed at length. The risks and benefits of total hip arthroplasty were presented and reviewed. The risks due to aseptic loosening, infection, stiffness, dislocation/subluxation,  thromboembolic complications and other imponderables were discussed.  The patient acknowledged the explanation, agreed to proceed with the plan and consent was signed. Patient is being admitted for inpatient treatment for surgery, pain control, PT, OT, prophylactic antibiotics, VTE prophylaxis, progressive ambulation and ADL's and discharge planning.The patient is planning to be discharged home with  home health services.      [place] Koray Soter   PA-C  04/28/2016, 1:38 PM

## 2016-04-29 ENCOUNTER — Encounter (HOSPITAL_COMMUNITY): Payer: Self-pay

## 2016-05-02 DIAGNOSIS — I10 Essential (primary) hypertension: Secondary | ICD-10-CM | POA: Diagnosis not present

## 2016-05-02 NOTE — Progress Notes (Signed)
lov with clearance 9/17 dr Virgina Jock on chart, cmp 04/20/16 chart

## 2016-05-02 NOTE — Patient Instructions (Addendum)
NATORIA WEATHERLEY  05/02/2016   Your procedure is scheduled on: 05/10/16 Report to [facility] Main  Entrance take [facility]  elevators to 3rd floor to  [place] at 11:20 AM.  Call this number if you have problems the morning of surgery [phone]   Remember: ONLY 1 PERSON MAY GO WITH YOU TO SHORT STAY TO GET  READY MORNING OF [place].  Do not eat food:After Midnight.--- MAY HAVE CLEAR LIQUIDS MORNING OF SURGERY UNTIL 08:20AM--THEN NOTHING BY MOUTH     Take these medicines the morning of surgery with A SIP OF WATER:AMLODIPINE, , Levothyroxine, Pantoprazole  DO NOT TAKE ANY DIABETIC MEDICATIONS DAY OF YOUR SURGERY                               You may not have any metal on your body including hair pins and              piercings  Do not wear jewelry, make-up, lotions, powders or perfumes, deodorant             Do not wear nail polish.  Do not shave  48 hours prior to surgery.              Men may shave face and neck.   Do not bring valuables to the hospital. [place].  Contacts, dentures or bridgework may not be worn into surgery.  Leave suitcase in the car. After surgery it may be brought to your room.              [place] - Preparing for Surgery Before surgery, you can play an important role.  Because skin is not sterile, your skin needs to be as free of germs as possible.  You can reduce the number of germs on your skin by washing with CHG (chlorahexidine gluconate) soap before surgery.  CHG is an antiseptic cleaner which kills germs and bonds with the skin to continue killing germs even after washing. Please DO NOT use if you have an allergy to CHG or antibacterial soaps.  If your skin becomes reddened/irritated stop using the CHG and inform your nurse when you arrive at Short Stay. Do not shave (including legs and underarms) for at least 48 hours prior to the first CHG shower.  You may shave your  face/neck. Please follow these instructions carefully:  1.  Shower with CHG Soap the night before surgery and the  morning of Surgery.  2.  If you choose to wash your hair, wash your hair first as usual with your  normal  shampoo.  3.  After you shampoo, rinse your hair and body thoroughly to remove the  shampoo.                           4.  Use CHG as you would any other liquid soap.  You can apply chg directly  to the skin and wash                       Gently with a scrungie or clean washcloth.  5.  Apply the CHG Soap to your body ONLY FROM THE NECK DOWN.  Do not use on face/ open                           Wound or open sores. Avoid contact with eyes, ears mouth and genitals (private parts).                       Wash face,  Genitals (private parts) with your normal soap.             6.  Wash thoroughly, paying special attention to the area where your surgery  will be performed.  7.  Thoroughly rinse your body with warm water from the neck down.  8.  DO NOT shower/wash with your normal soap after using and rinsing off  the CHG Soap.                9.  Pat yourself dry with a clean towel.            10.  Wear clean pajamas.            11.  Place clean sheets on your bed the night of your first shower and do not  sleep with pets. Day of Surgery : Do not apply any lotions/deodorants the morning of surgery.  Please wear clean clothes to the hospital/surgery center.  FAILURE TO FOLLOW THESE INSTRUCTIONS MAY RESULT IN THE CANCELLATION OF YOUR SURGERY PATIENT SIGNATURE_________________________________  NURSE SIGNATURE__________________________________  ________________________________________________________________________    CLEAR LIQUID DIET   Foods Allowed                                                                     Foods Excluded  Coffee and tea, regular and decaf                             liquids that you cannot  Plain Jell-O in any flavor                                              see through such as: Fruit ices (not with fruit pulp)                                     milk, soups, orange juice  Iced Popsicles                                    All solid food Carbonated beverages, regular and diet                                    Cranberry, grape and apple juices Sports drinks like Gatorade Lightly seasoned clear broth or consume(fat free) Sugar, honey syrup  Sample Menu Breakfast  Lunch                                     Supper Cranberry juice                    Beef broth                            Chicken broth Jell-O                                     Grape juice                           Apple juice Coffee or tea                        Jell-O                                      Popsicle                                                Coffee or tea                        Coffee or tea  _____________________________________________________________________   WHAT IS A BLOOD TRANSFUSION? Blood Transfusion Information  A transfusion is the replacement of blood or some of its parts. Blood is made up of multiple cells which provide different functions.  Red blood cells carry oxygen and are used for blood loss replacement.  White blood cells fight against infection.  Platelets control bleeding.  Plasma helps clot blood.  Other blood products are available for specialized needs, such as hemophilia or other clotting disorders. BEFORE THE TRANSFUSION  Who gives blood for transfusions?   Healthy volunteers who are fully evaluated to make sure their blood is safe. This is blood bank blood. Transfusion therapy is the safest it has ever been in the practice of medicine. Before blood is taken from a donor, a complete history is taken to make sure that person has no history of diseases nor engages in risky social behavior (examples are intravenous drug use or sexual activity with multiple partners). The donor's travel  history is screened to minimize risk of transmitting infections, such as malaria. The donated blood is tested for signs of infectious diseases, such as HIV and hepatitis. The blood is then tested to be sure it is compatible with you in order to minimize the chance of a transfusion reaction. If you or a relative donates blood, this is often done in anticipation of surgery and is not appropriate for emergency situations. It takes many days to process the donated blood. RISKS AND COMPLICATIONS Although transfusion therapy is very safe and saves many lives, the main dangers of transfusion include:   Getting an infectious disease.  Developing a transfusion reaction. This is an allergic reaction to something in the blood you were given. Every precaution is taken to prevent this. The decision to have  a blood transfusion has been considered carefully by your caregiver before blood is given. Blood is not given unless the benefits outweigh the risks. AFTER THE TRANSFUSION  Right after receiving a blood transfusion, you will usually feel much better and more energetic. This is especially true if your red blood cells have gotten low (anemic). The transfusion raises the level of the red blood cells which carry oxygen, and this usually causes an energy increase.  The nurse administering the transfusion will monitor you carefully for complications. HOME CARE INSTRUCTIONS  No special instructions are needed after a transfusion. You may find your energy is better. Speak with your caregiver about any limitations on activity for underlying diseases you may have. SEEK MEDICAL CARE IF:   Your condition is not improving after your transfusion.  You develop redness or irritation at the intravenous (IV) site. SEEK IMMEDIATE MEDICAL CARE IF:  Any of the following symptoms occur over the next 12 hours:  Shaking chills.  You have a temperature by mouth above 102 F (38.9 C), not controlled by medicine.  Chest,  back, or muscle pain.  People around you feel you are not acting correctly or are confused.  Shortness of breath or difficulty breathing.  Dizziness and fainting.  You get a rash or develop hives.  You have a decrease in urine output.  Your urine turns a dark color or changes to pink, red, or brown. Any of the following symptoms occur over the next 10 days:  You have a temperature by mouth above 102 F (38.9 C), not controlled by medicine.  Shortness of breath.  Weakness after normal activity.  The white part of the eye turns yellow (jaundice).  You have a decrease in the amount of urine or are urinating less often.  Your urine turns a dark color or changes to pink, red, or brown. Document Released: 07/08/2000 Document Revised: 10/03/2011 Document Reviewed: 02/25/2008 ExitCare Patient Information 2014 ExitCare, [place].  _______________________________________________________________________   CLEAR LIQUID DIET   Foods Allowed                                                                     Foods Excluded  Coffee and tea, regular and decaf                             liquids that you cannot  Plain Jell-O in any flavor                                             see through such as: Fruit ices (not with fruit pulp)                                     milk, soups, orange juice  Iced Popsicles                                    All solid food Carbonated beverages, regular and diet  Cranberry, grape and apple juices Sports drinks like Gatorade Lightly seasoned clear broth or consume(fat free) Sugar, honey syrup  Sample Menu Breakfast                                Lunch                                     Supper Cranberry juice                    Beef broth                            Chicken broth Jell-O                                     Grape juice                           Apple juice Coffee or tea                        Jell-O                                       Popsicle                                                Coffee or tea                        Coffee or tea  _____________________________________________________________________

## 2016-05-03 ENCOUNTER — Encounter (HOSPITAL_COMMUNITY)
Admission: RE | Admit: 2016-05-03 | Discharge: 2016-05-03 | Disposition: A | Discharge status: Home / Self Care | Payer: PPO | Source: Ambulatory Visit | Attending: Orthopedic Surgery | Admitting: Orthopedic Surgery

## 2016-05-03 ENCOUNTER — Encounter (HOSPITAL_COMMUNITY): Payer: Self-pay

## 2016-05-03 ENCOUNTER — Encounter (INDEPENDENT_AMBULATORY_CARE_PROVIDER_SITE_OTHER): Payer: Self-pay

## 2016-05-03 DIAGNOSIS — M1611 Unilateral primary osteoarthritis, right hip: Secondary | ICD-10-CM | POA: Insufficient documentation

## 2016-05-03 DIAGNOSIS — Z01812 Encounter for preprocedural laboratory examination: Secondary | ICD-10-CM | POA: Diagnosis not present

## 2016-05-03 DIAGNOSIS — M25551 Pain in right hip: Secondary | ICD-10-CM | POA: Insufficient documentation

## 2016-05-03 LAB — SURGICAL PCR SCREEN
MRSA, PCR: NEGATIVE
STAPHYLOCOCCUS AUREUS: NEGATIVE

## 2016-05-03 LAB — CBC
HEMATOCRIT: 36.3 % (ref 36.0–46.0)
HEMOGLOBIN: 11.4 g/dL — AB (ref 12.0–15.0)
MCH: 28.9 pg (ref 26.0–34.0)
MCHC: 31.4 g/dL (ref 30.0–36.0)
MCV: 92.1 fL (ref 78.0–100.0)
Platelets: 406 10*3/uL — ABNORMAL HIGH (ref 150–400)
RBC: 3.94 MIL/uL (ref 3.87–5.11)
RDW: 14.1 % (ref 11.5–15.5)
WBC: 11.5 10*3/uL — ABNORMAL HIGH (ref 4.0–10.5)

## 2016-05-03 NOTE — Progress Notes (Signed)
[facility] 10/17 on chart, ekg 5/17 on chart, tee 5/17 chart

## 2016-05-10 ENCOUNTER — Inpatient Hospital Stay (HOSPITAL_COMMUNITY): Payer: PPO

## 2016-05-10 ENCOUNTER — Inpatient Hospital Stay (HOSPITAL_COMMUNITY)
Admission: RE | Admit: 2016-05-10 | Discharge: 2016-05-12 | DRG: 470 | Disposition: A | Discharge status: Home Health | Payer: PPO | Source: Ambulatory Visit | Attending: Orthopedic Surgery | Admitting: Orthopedic Surgery

## 2016-05-10 ENCOUNTER — Inpatient Hospital Stay (HOSPITAL_COMMUNITY): Payer: PPO | Admitting: Anesthesiology

## 2016-05-10 ENCOUNTER — Encounter (HOSPITAL_COMMUNITY): Payer: Self-pay | Admitting: *Deleted

## 2016-05-10 ENCOUNTER — Encounter (HOSPITAL_COMMUNITY)
Admission: RE | Disposition: A | Discharge status: Home Health | Payer: Self-pay | Source: Ambulatory Visit | Attending: Orthopedic Surgery

## 2016-05-10 DIAGNOSIS — I10 Essential (primary) hypertension: Secondary | ICD-10-CM | POA: Diagnosis present

## 2016-05-10 DIAGNOSIS — Z96652 Presence of left artificial knee joint: Secondary | ICD-10-CM | POA: Diagnosis not present

## 2016-05-10 DIAGNOSIS — Z791 Long term (current) use of non-steroidal anti-inflammatories (NSAID): Secondary | ICD-10-CM | POA: Diagnosis not present

## 2016-05-10 DIAGNOSIS — Z96649 Presence of unspecified artificial hip joint: Secondary | ICD-10-CM

## 2016-05-10 DIAGNOSIS — Z471 Aftercare following joint replacement surgery: Secondary | ICD-10-CM | POA: Diagnosis not present

## 2016-05-10 DIAGNOSIS — Z87891 Personal history of nicotine dependence: Secondary | ICD-10-CM | POA: Diagnosis not present

## 2016-05-10 DIAGNOSIS — E039 Hypothyroidism, unspecified: Secondary | ICD-10-CM | POA: Diagnosis not present

## 2016-05-10 DIAGNOSIS — M1611 Unilateral primary osteoarthritis, right hip: Principal | ICD-10-CM | POA: Diagnosis present

## 2016-05-10 DIAGNOSIS — K219 Gastro-esophageal reflux disease without esophagitis: Secondary | ICD-10-CM | POA: Diagnosis present

## 2016-05-10 DIAGNOSIS — Z8679 Personal history of other diseases of the circulatory system: Secondary | ICD-10-CM

## 2016-05-10 DIAGNOSIS — Z888 Allergy status to other drugs, medicaments and biological substances status: Secondary | ICD-10-CM

## 2016-05-10 DIAGNOSIS — Z96641 Presence of right artificial hip joint: Secondary | ICD-10-CM | POA: Diagnosis not present

## 2016-05-10 DIAGNOSIS — E663 Overweight: Secondary | ICD-10-CM | POA: Diagnosis not present

## 2016-05-10 DIAGNOSIS — Z6825 Body mass index (BMI) 25.0-25.9, adult: Secondary | ICD-10-CM

## 2016-05-10 HISTORY — PX: TOTAL HIP ARTHROPLASTY: SHX124

## 2016-05-10 LAB — TYPE AND SCREEN
ABO/RH(D): AB NEG
Antibody Screen: NEGATIVE

## 2016-05-10 IMAGING — DX DG HIP (WITH OR WITHOUT PELVIS) 1V PORT*R*
2 series · 2 of 2 positions shown · non-contrast
Comparison: None.

CLINICAL DATA: Status post right hip replacement

EXAM:
DG HIP (WITH OR WITHOUT PELVIS) 1V PORT RIGHT

[pelvis ap]
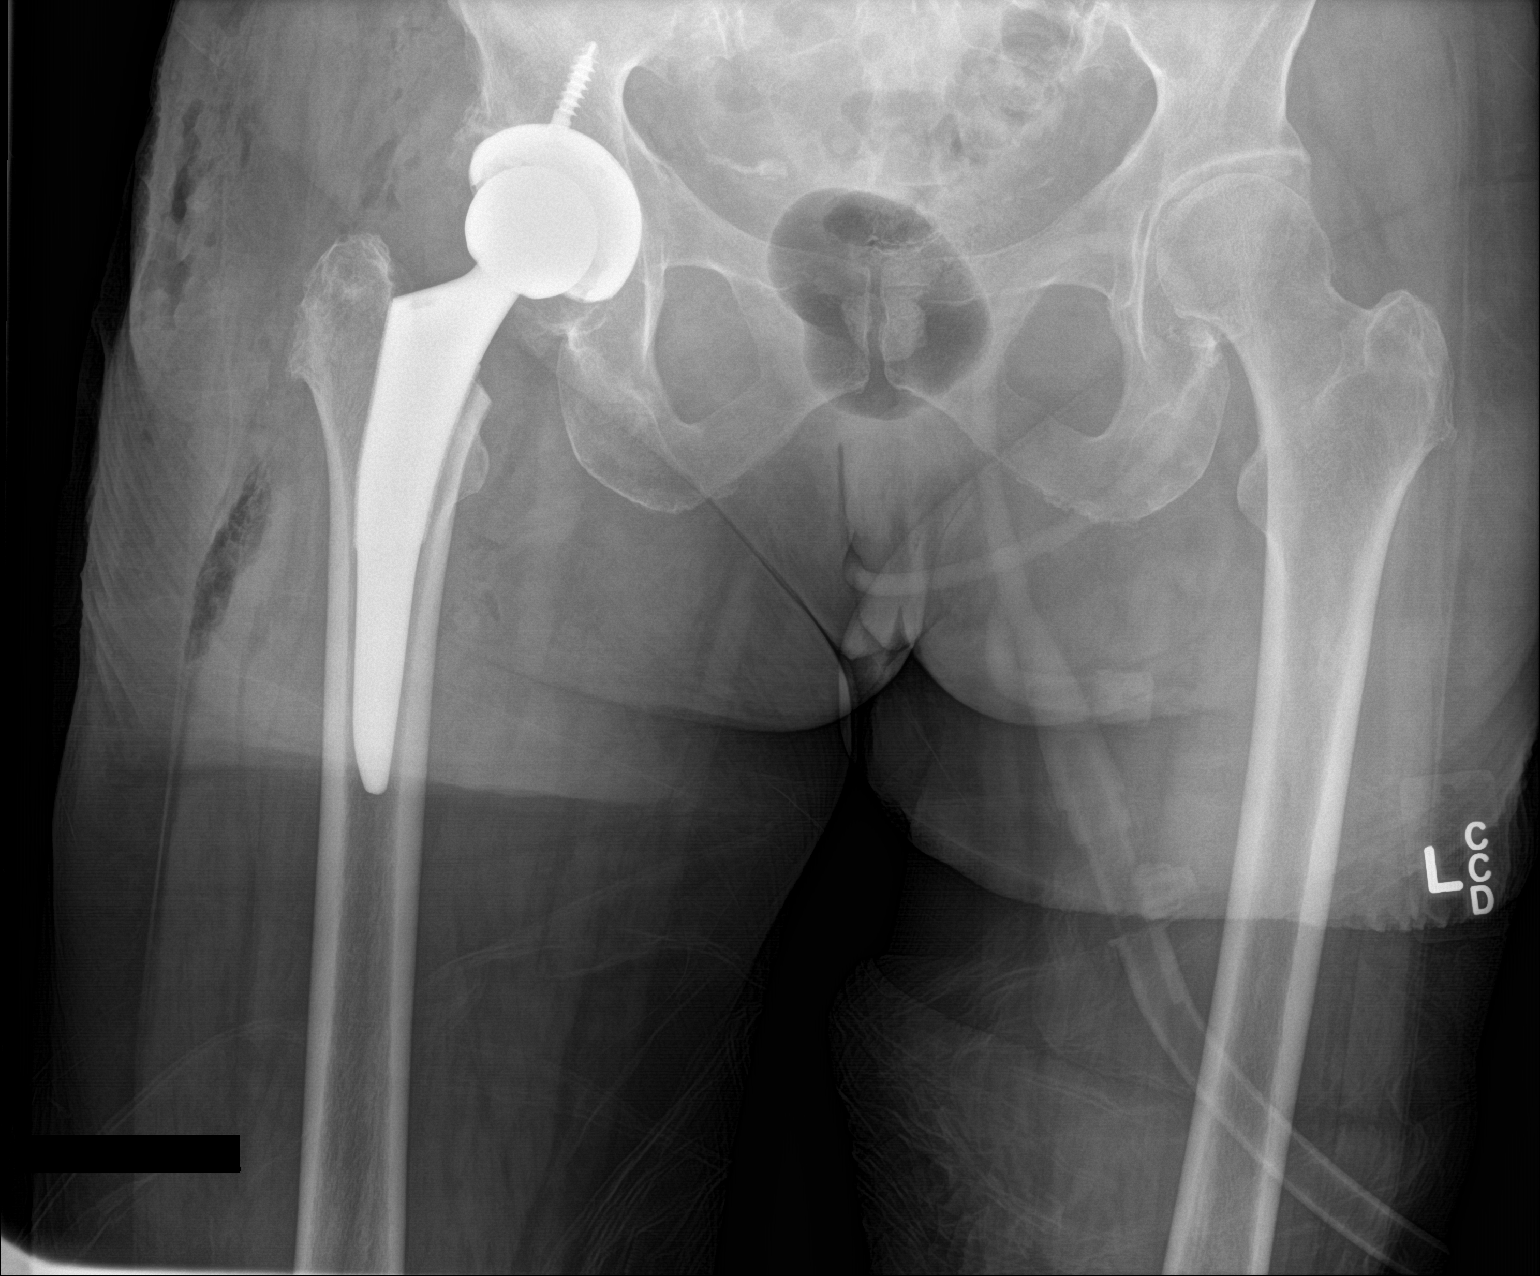

[hip frog leg]
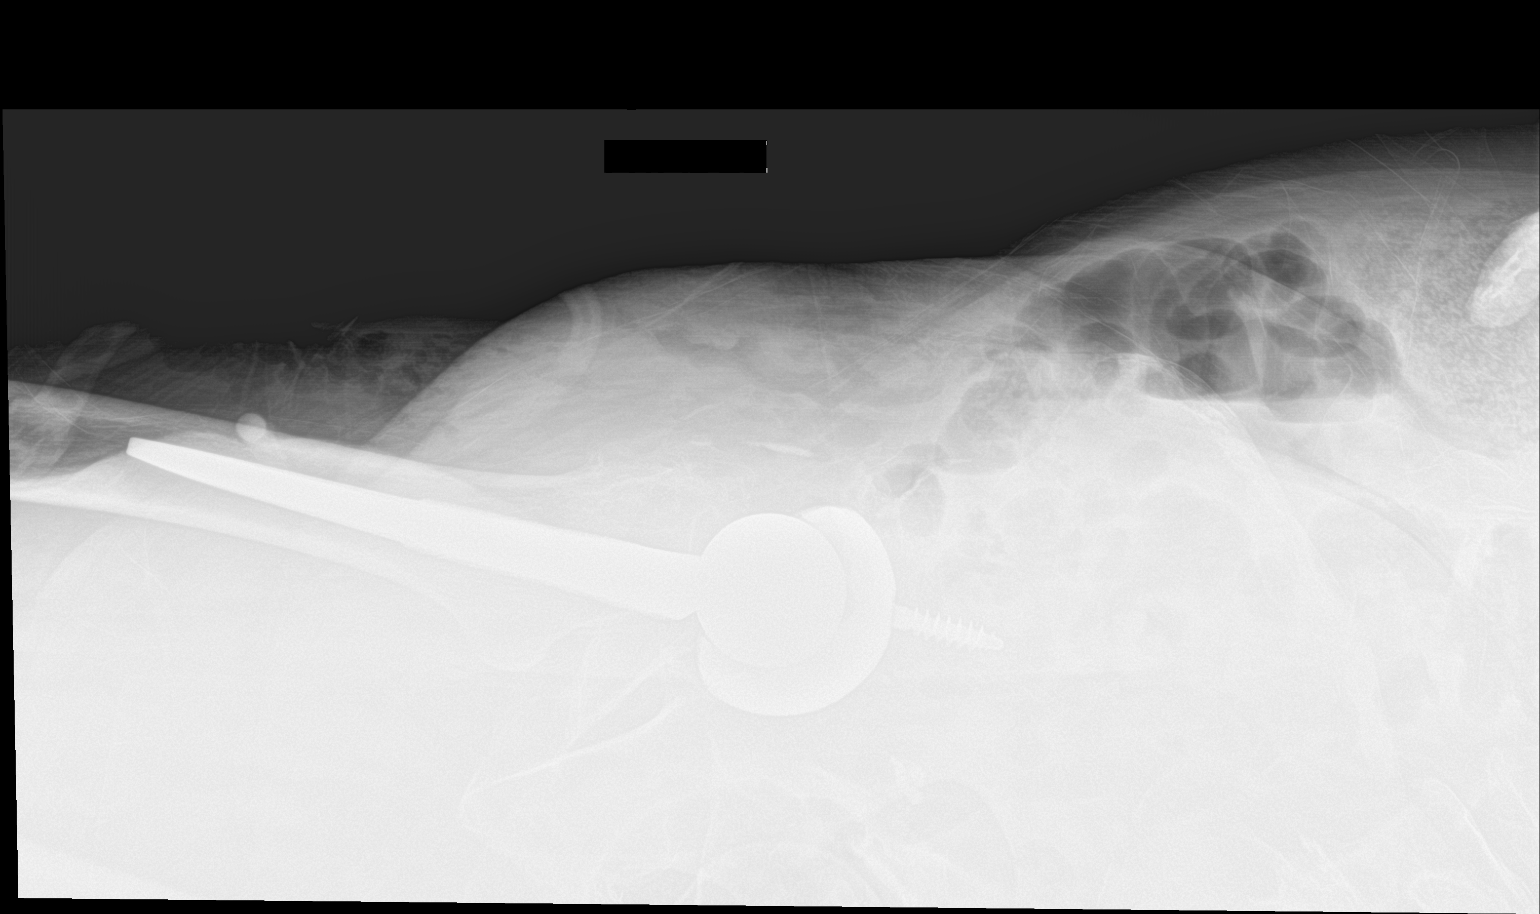

[2 of 2 positions shown; findings below may reference images not displayed]

FINDINGS: Right hip replacement is seen in satisfactory position. No acute
bony abnormality is noted. Air is noted in the surgical bed.
IMPRESSION: Status post right hip replacement.  No acute abnormality noted.

## 2016-05-10 SURGERY — ARTHROPLASTY, HIP, TOTAL, ANTERIOR APPROACH
Anesthesia: General | Site: Hip | Laterality: Right

## 2016-05-10 MED ORDER — NADOLOL 20 MG PO TABS: 5.0000 mg | ORAL_TABLET | Freq: Every day | ORAL | Status: DC

## 2016-05-10 MED ORDER — METOCLOPRAMIDE HCL 5 MG PO TABS: 5.0000 mg | ORAL_TABLET | Freq: Three times a day (TID) | ORAL | Status: DC | PRN

## 2016-05-10 MED ORDER — DEXAMETHASONE SODIUM PHOSPHATE 10 MG/ML IJ SOLN
10.0000 mg | Freq: Once | INTRAMUSCULAR | Status: DC
  Filled 2016-05-10: qty 1

## 2016-05-10 MED ORDER — SODIUM CHLORIDE 0.9 % IV SOLN
1000.0000 mg | INTRAVENOUS | Status: AC
  Administered 2016-05-10: 1000 mg via INTRAVENOUS
  Filled 2016-05-10: qty 1100

## 2016-05-10 MED ORDER — LIDOCAINE 2% (20 MG/ML) 5 ML SYRINGE
INTRAMUSCULAR | Status: AC
  Filled 2016-05-10: qty 5

## 2016-05-10 MED ORDER — METHOCARBAMOL 500 MG PO TABS
500.0000 mg | ORAL_TABLET | Freq: Four times a day (QID) | ORAL | Status: DC | PRN
  Administered 2016-05-11: 500 mg via ORAL
  Filled 2016-05-10: qty 1

## 2016-05-10 MED ORDER — CEFAZOLIN SODIUM-DEXTROSE 2-4 GM/100ML-% IV SOLN
2.0000 g | Freq: Four times a day (QID) | INTRAVENOUS | Status: AC
  Administered 2016-05-10 – 2016-05-11 (×2): 2 g via INTRAVENOUS
  Filled 2016-05-10 (×2): qty 100

## 2016-05-10 MED ORDER — PROMETHAZINE HCL 25 MG/ML IJ SOLN: 6.2500 mg | INTRAMUSCULAR | Status: DC | PRN

## 2016-05-10 MED ORDER — HYDROMORPHONE HCL 2 MG/ML IJ SOLN
INTRAMUSCULAR | Status: AC
  Filled 2016-05-10: qty 1

## 2016-05-10 MED ORDER — PROPOFOL 10 MG/ML IV BOLUS
INTRAVENOUS | Status: DC | PRN
  Administered 2016-05-10: 120 mg via INTRAVENOUS

## 2016-05-10 MED ORDER — ALUM & MAG HYDROXIDE-SIMETH 200-200-20 MG/5ML PO SUSP: 30.0000 mL | ORAL | Status: DC | PRN

## 2016-05-10 MED ORDER — DOCUSATE SODIUM 100 MG PO CAPS
100.0000 mg | ORAL_CAPSULE | Freq: Two times a day (BID) | ORAL | Status: DC
  Administered 2016-05-10 – 2016-05-11 (×3): 100 mg via ORAL
  Filled 2016-05-10 (×3): qty 1

## 2016-05-10 MED ORDER — STERILE WATER FOR IRRIGATION IR SOLN
Status: DC | PRN
  Administered 2016-05-10: 3000 mL

## 2016-05-10 MED ORDER — DEXAMETHASONE SODIUM PHOSPHATE 10 MG/ML IJ SOLN
INTRAMUSCULAR | Status: AC
  Filled 2016-05-10: qty 1

## 2016-05-10 MED ORDER — SUGAMMADEX SODIUM 200 MG/2ML IV SOLN
INTRAVENOUS | Status: AC
  Filled 2016-05-10: qty 2

## 2016-05-10 MED ORDER — HYDROMORPHONE HCL 1 MG/ML IJ SOLN
INTRAMUSCULAR | Status: DC | PRN
  Administered 2016-05-10: 0.5 mg via INTRAVENOUS
  Administered 2016-05-10: 1 mg via INTRAVENOUS
  Administered 2016-05-10: 0.5 mg via INTRAVENOUS

## 2016-05-10 MED ORDER — PROPOFOL 10 MG/ML IV BOLUS
INTRAVENOUS | Status: AC
  Filled 2016-05-10: qty 40

## 2016-05-10 MED ORDER — CHLORHEXIDINE GLUCONATE 4 % EX LIQD: 60.0000 mL | Freq: Once | CUTANEOUS | Status: DC

## 2016-05-10 MED ORDER — MIDAZOLAM HCL 2 MG/2ML IJ SOLN
INTRAMUSCULAR | Status: AC
  Filled 2016-05-10: qty 2

## 2016-05-10 MED ORDER — LEVOTHYROXINE SODIUM 112 MCG PO TABS
112.0000 ug | ORAL_TABLET | Freq: Every day | ORAL | Status: DC
  Administered 2016-05-11 – 2016-05-12 (×2): 112 ug via ORAL
  Filled 2016-05-10 (×2): qty 1

## 2016-05-10 MED ORDER — ACETAMINOPHEN 500 MG PO TABS
1000.0000 mg | ORAL_TABLET | Freq: Three times a day (TID) | ORAL | Status: DC
  Administered 2016-05-10 – 2016-05-12 (×5): 1000 mg via ORAL
  Filled 2016-05-10 (×5): qty 2

## 2016-05-10 MED ORDER — FENTANYL CITRATE (PF) 100 MCG/2ML IJ SOLN
INTRAMUSCULAR | Status: AC
  Filled 2016-05-10: qty 2

## 2016-05-10 MED ORDER — FERROUS SULFATE 325 (65 FE) MG PO TABS
325.0000 mg | ORAL_TABLET | Freq: Three times a day (TID) | ORAL | Status: DC
  Administered 2016-05-10 – 2016-05-12 (×3): 325 mg via ORAL
  Filled 2016-05-10 (×3): qty 1

## 2016-05-10 MED ORDER — ONDANSETRON HCL 4 MG/2ML IJ SOLN
INTRAMUSCULAR | Status: AC
  Filled 2016-05-10: qty 2

## 2016-05-10 MED ORDER — CELECOXIB 200 MG PO CAPS
200.0000 mg | ORAL_CAPSULE | Freq: Two times a day (BID) | ORAL | Status: DC
  Administered 2016-05-10 – 2016-05-11 (×3): 200 mg via ORAL
  Filled 2016-05-10 (×3): qty 1

## 2016-05-10 MED ORDER — SODIUM CHLORIDE 0.9 % IR SOLN
Status: DC | PRN
  Administered 2016-05-10: 1000 mL

## 2016-05-10 MED ORDER — ROCURONIUM BROMIDE 50 MG/5ML IV SOSY
PREFILLED_SYRINGE | INTRAVENOUS | Status: AC
  Filled 2016-05-10: qty 5

## 2016-05-10 MED ORDER — LIDOCAINE HCL (CARDIAC) 20 MG/ML IV SOLN
INTRAVENOUS | Status: DC | PRN
  Administered 2016-05-10: 50 mg via INTRAVENOUS

## 2016-05-10 MED ORDER — AMLODIPINE BESYLATE 5 MG PO TABS
5.0000 mg | ORAL_TABLET | Freq: Two times a day (BID) | ORAL | Status: DC
  Administered 2016-05-10 – 2016-05-12 (×4): 5 mg via ORAL
  Filled 2016-05-10 (×4): qty 1

## 2016-05-10 MED ORDER — ROCURONIUM BROMIDE 100 MG/10ML IV SOLN
INTRAVENOUS | Status: DC | PRN
  Administered 2016-05-10: 30 mg via INTRAVENOUS

## 2016-05-10 MED ORDER — PANTOPRAZOLE SODIUM 40 MG PO TBEC
40.0000 mg | DELAYED_RELEASE_TABLET | Freq: Every day | ORAL | Status: DC | PRN
  Administered 2016-05-11: 40 mg via ORAL
  Filled 2016-05-10: qty 1

## 2016-05-10 MED ORDER — HYDROMORPHONE HCL 1 MG/ML IJ SOLN
INTRAMUSCULAR | Status: AC
  Filled 2016-05-10: qty 1

## 2016-05-10 MED ORDER — CEFAZOLIN SODIUM-DEXTROSE 2-4 GM/100ML-% IV SOLN
INTRAVENOUS | Status: AC
  Filled 2016-05-10: qty 100

## 2016-05-10 MED ORDER — SUGAMMADEX SODIUM 200 MG/2ML IV SOLN
INTRAVENOUS | Status: DC | PRN
  Administered 2016-05-10: 150 mg via INTRAVENOUS

## 2016-05-10 MED ORDER — MENTHOL 3 MG MT LOZG: 1.0000 | LOZENGE | OROMUCOSAL | Status: DC | PRN

## 2016-05-10 MED ORDER — METHOCARBAMOL 1000 MG/10ML IJ SOLN
500.0000 mg | Freq: Four times a day (QID) | INTRAMUSCULAR | Status: DC | PRN
  Administered 2016-05-10: 500 mg via INTRAVENOUS
  Filled 2016-05-10: qty 550
  Filled 2016-05-10: qty 5

## 2016-05-10 MED ORDER — NON FORMULARY: 5.0000 mg | Freq: Every day | Status: DC

## 2016-05-10 MED ORDER — PHENOL 1.4 % MT LIQD: 1.0000 | OROMUCOSAL | Status: DC | PRN

## 2016-05-10 MED ORDER — DEXAMETHASONE SODIUM PHOSPHATE 10 MG/ML IJ SOLN
10.0000 mg | Freq: Once | INTRAMUSCULAR | Status: AC
  Administered 2016-05-10: 10 mg via INTRAVENOUS

## 2016-05-10 MED ORDER — OXYCODONE HCL 5 MG PO TABS
5.0000 mg | ORAL_TABLET | ORAL | Status: DC
  Administered 2016-05-10 (×3): 10 mg via ORAL
  Administered 2016-05-11 (×3): 5 mg via ORAL
  Administered 2016-05-11 (×2): 10 mg via ORAL
  Administered 2016-05-12 (×2): 5 mg via ORAL
  Filled 2016-05-10: qty 2
  Filled 2016-05-10 (×3): qty 1
  Filled 2016-05-10 (×5): qty 2

## 2016-05-10 MED ORDER — NONFORMULARY OR COMPOUNDED ITEM: 5.0000 mg | Freq: Every day | Status: DC

## 2016-05-10 MED ORDER — ONDANSETRON HCL 4 MG PO TABS: 4.0000 mg | ORAL_TABLET | Freq: Four times a day (QID) | ORAL | Status: DC | PRN

## 2016-05-10 MED ORDER — ONDANSETRON HCL 4 MG/2ML IJ SOLN: 4.0000 mg | Freq: Four times a day (QID) | INTRAMUSCULAR | Status: DC | PRN

## 2016-05-10 MED ORDER — DIPHENHYDRAMINE HCL 25 MG PO CAPS: 25.0000 mg | ORAL_CAPSULE | Freq: Four times a day (QID) | ORAL | Status: DC | PRN

## 2016-05-10 MED ORDER — SODIUM CHLORIDE 0.9 % IV SOLN
100.0000 mL/h | INTRAVENOUS | Status: DC
  Administered 2016-05-10: 100 mL/h via INTRAVENOUS
  Filled 2016-05-10 (×5): qty 1000

## 2016-05-10 MED ORDER — ONDANSETRON HCL 4 MG/2ML IJ SOLN
INTRAMUSCULAR | Status: DC | PRN
  Administered 2016-05-10: 4 mg via INTRAVENOUS

## 2016-05-10 MED ORDER — OXYCODONE HCL 5 MG PO TABS
ORAL_TABLET | ORAL | Status: AC
  Administered 2016-05-10: 10 mg via ORAL
  Filled 2016-05-10: qty 2

## 2016-05-10 MED ORDER — LACTATED RINGERS IV SOLN
INTRAVENOUS | Status: DC
  Administered 2016-05-10 (×2): via INTRAVENOUS

## 2016-05-10 MED ORDER — POLYETHYLENE GLYCOL 3350 17 G PO PACK
17.0000 g | PACK | Freq: Two times a day (BID) | ORAL | Status: DC
  Administered 2016-05-10 – 2016-05-11 (×2): 17 g via ORAL
  Filled 2016-05-10 (×3): qty 1

## 2016-05-10 MED ORDER — CEFAZOLIN SODIUM-DEXTROSE 2-4 GM/100ML-% IV SOLN
2.0000 g | INTRAVENOUS | Status: AC
  Administered 2016-05-10: 2 g via INTRAVENOUS
  Filled 2016-05-10: qty 100

## 2016-05-10 MED ORDER — ASPIRIN 81 MG PO CHEW
81.0000 mg | CHEWABLE_TABLET | Freq: Two times a day (BID) | ORAL | Status: DC
  Filled 2016-05-10 (×2): qty 1

## 2016-05-10 MED ORDER — BISACODYL 10 MG RE SUPP: 10.0000 mg | Freq: Every day | RECTAL | Status: DC | PRN

## 2016-05-10 MED ORDER — HYDROMORPHONE HCL 1 MG/ML IJ SOLN
0.2500 mg | INTRAMUSCULAR | Status: DC | PRN
Start: 2016-05-10 — End: 2016-05-10
  Administered 2016-05-10 (×4): 0.5 mg via INTRAVENOUS

## 2016-05-10 MED ORDER — METOCLOPRAMIDE HCL 5 MG/ML IJ SOLN: 5.0000 mg | Freq: Three times a day (TID) | INTRAMUSCULAR | Status: DC | PRN

## 2016-05-10 MED ORDER — FENTANYL CITRATE (PF) 100 MCG/2ML IJ SOLN
INTRAMUSCULAR | Status: DC | PRN
  Administered 2016-05-10 (×2): 50 ug via INTRAVENOUS

## 2016-05-10 MED ORDER — HYDROMORPHONE HCL 1 MG/ML IJ SOLN: 0.5000 mg | INTRAMUSCULAR | Status: DC | PRN

## 2016-05-10 MED ORDER — MIDAZOLAM HCL 5 MG/5ML IJ SOLN
INTRAMUSCULAR | Status: DC | PRN
  Administered 2016-05-10: 2 mg via INTRAVENOUS

## 2016-05-10 MED ORDER — MAGNESIUM CITRATE PO SOLN: 1.0000 | Freq: Once | ORAL | Status: DC | PRN

## 2016-05-10 SURGICAL SUPPLY — 36 items
BAG DECANTER FOR FLEXI CONT (MISCELLANEOUS) IMPLANT
BAG ZIPLOCK 12X15 (MISCELLANEOUS) IMPLANT
CAPT HIP TOTAL 2 ×2 IMPLANT
CLOTH BEACON ORANGE TIMEOUT ST (SAFETY) ×2 IMPLANT
COVER PERINEAL POST (MISCELLANEOUS) ×2 IMPLANT
DERMABOND ADVANCED (GAUZE/BANDAGES/DRESSINGS) ×1
DERMABOND ADVANCED .7 DNX12 (GAUZE/BANDAGES/DRESSINGS) ×1 IMPLANT
DRAPE STERI IOBAN 125X83 (DRAPES) ×2 IMPLANT
DRAPE U-SHAPE 47X51 STRL (DRAPES) ×4 IMPLANT
DRESSING AQUACEL AG SP 3.5X10 (GAUZE/BANDAGES/DRESSINGS) ×1 IMPLANT
DRSG AQUACEL AG ADV 3.5X10 (GAUZE/BANDAGES/DRESSINGS) ×2 IMPLANT
DRSG AQUACEL AG SP 3.5X10 (GAUZE/BANDAGES/DRESSINGS) ×2
DURAPREP 26ML APPLICATOR (WOUND CARE) ×2 IMPLANT
ELECT REM PT RETURN 15FT ADLT (MISCELLANEOUS) IMPLANT
ELECT REM PT RETURN 9FT ADLT (ELECTROSURGICAL) ×2
ELECTRODE REM PT RTRN 9FT ADLT (ELECTROSURGICAL) ×1 IMPLANT
GLOVE BIOGEL M STRL SZ7.5 (GLOVE) IMPLANT
GLOVE BIOGEL PI IND STRL 7.5 (GLOVE) ×6 IMPLANT
GLOVE BIOGEL PI IND STRL 8.5 (GLOVE) ×1 IMPLANT
GLOVE BIOGEL PI INDICATOR 7.5 (GLOVE) ×6
GLOVE BIOGEL PI INDICATOR 8.5 (GLOVE) ×1
GLOVE ECLIPSE 8.0 STRL XLNG CF (GLOVE) ×4 IMPLANT
GLOVE ORTHO TXT STRL SZ7.5 (GLOVE) ×2 IMPLANT
GOWN STRL REUS W/TWL LRG LVL3 (GOWN DISPOSABLE) ×4 IMPLANT
GOWN STRL REUS W/TWL XL LVL3 (GOWN DISPOSABLE) ×4 IMPLANT
HOLDER FOLEY CATH W/STRAP (MISCELLANEOUS) ×2 IMPLANT
PACK ANTERIOR HIP CUSTOM (KITS) ×2 IMPLANT
SAW OSC TIP CART 19.5X105X1.3 (SAW) ×2 IMPLANT
SUT MNCRL AB 4-0 PS2 18 (SUTURE) ×2 IMPLANT
SUT VIC AB 1 CT1 36 (SUTURE) ×6 IMPLANT
SUT VIC AB 2-0 CT1 27 (SUTURE) ×2
SUT VIC AB 2-0 CT1 TAPERPNT 27 (SUTURE) ×2 IMPLANT
SUT VLOC 180 0 24IN GS25 (SUTURE) ×2 IMPLANT
TRAY FOLEY W/METER SILVER 16FR (SET/KITS/TRAYS/PACK) IMPLANT
WATER STERILE IRR 1500ML POUR (IV SOLUTION) ×2 IMPLANT
YANKAUER SUCT BULB TIP 10FT TU (MISCELLANEOUS) IMPLANT

## 2016-05-10 NOTE — Op Note (Addendum)
NAME:  Heidi Adams NO.: [id number]      MEDICAL RECORD NO.: [place]:[id number]      FACILITY:  [facility]      PHYSICIAN:  Paralee Cancel D  DATE OF BIRTH:  February 25, 1936     DATE OF PROCEDURE:  05/10/2016                                 OPERATIVE REPORT         PREOPERATIVE DIAGNOSIS: Right  hip osteoarthritis.      POSTOPERATIVE DIAGNOSIS:  Right hip osteoarthritis.      PROCEDURE:  Right total hip replacement through an anterior approach   utilizing DePuy THR system, component size 16mm pinnacle cup, a size 32+4 neutral   Altrex liner, a size 5 Hi Tri Lock stem with a 32+1 delta ceramic   ball.      SURGEON:  Pietro Cassis. Alvan Dame, M.D.      ASSISTANT:  Molli Barrows, PA-C     ANESTHESIA:  General.      SPECIMENS:  None.      COMPLICATIONS:  None.      BLOOD LOSS:  300 cc     DRAINS:  None.      INDICATION OF THE PROCEDURE:  Heidi Adams is a 80 y.o. female who had   presented to office for evaluation of right hip pain.  Radiographs revealed   progressive degenerative changes with bone-on-bone   articulation to the  hip joint.  The patient had painful limited range of   motion significantly affecting their overall quality of life.  The patient was failing to    respond to conservative measures, and at this point was ready   to proceed with more definitive measures.  The patient has noted progressive   degenerative changes in his hip, progressive problems and dysfunction   with regarding the hip prior to surgery.  Consent was obtained for   benefit of pain relief.  Specific risk of infection, DVT, component   failure, dislocation, need for revision surgery, as well discussion of   the anterior versus posterior approach were reviewed.  Consent was   obtained for benefit of anterior pain relief through an anterior   approach.      PROCEDURE IN DETAIL:  The patient was brought to operative theater.   Once adequate anesthesia, preoperative  antibiotics, 2gm of Ancef, 1 gm of Tranexamic Acid, and 10 mg of Decadron administered.   The patient was positioned supine on the OSI [place] table.  Once adequate   padding of boney process was carried out, we had predraped out the hip, and  used fluoroscopy to confirm orientation of the pelvis and position.      The right hip was then prepped and draped from proximal iliac crest to   mid thigh with shower curtain technique.      Time-out was performed identifying the patient, planned procedure, and   extremity.     An incision was then made 2 cm distal and lateral to the   anterior superior iliac spine extending over the orientation of the   tensor fascia lata muscle and sharp dissection was carried down to the   fascia of the muscle and protractor placed in the soft tissues.      The fascia was  then incised.  The muscle belly was identified and swept   laterally and retractor placed along the superior neck.  Following   cauterization of the circumflex vessels and removing some pericapsular   fat, a second cobra retractor was placed on the inferior neck.  A third   retractor was placed on the anterior acetabulum after elevating the   anterior rectus.  A L-capsulotomy was along the line of the   superior neck to the trochanteric fossa, then extended proximally and   distally.  Tag sutures were placed and the retractors were then placed   intracapsular.  We then identified the trochanteric fossa and   orientation of my neck cut, confirmed this radiographically   and then made a neck osteotomy with the femur on traction.  The femoral   head was removed without difficulty or complication.  Traction was let   off and retractors were placed posterior and anterior around the   acetabulum.      The labrum and foveal tissue were debrided.  I began reaming with a 56mm   reamer and reamed up to 46mm reamer with good bony bed preparation and a 47mm   cup was chosen.  The final 80mm Pinnacle cup  was then impacted under fluoroscopy  to confirm the depth of penetration and orientation with respect to   abduction.  A screw was placed followed by the hole eliminator.  The final   32+4 neutral Altrex liner was impacted with good visualized rim fit.  The cup was positioned anatomically within the acetabular portion of the pelvis.      At this point, the femur was rolled at 80 degrees.  Further capsule was   released off the inferior aspect of the femoral neck.  I then   released the superior capsule proximally.  The hook was placed laterally   along the femur and elevated manually and held in position with the bed   hook.  The leg was then extended and adducted with the leg rolled to 100   degrees of external rotation.  Once the proximal femur was fully   exposed, I used a box osteotome to set orientation.  I then began   broaching with the starting chili pepper broach and passed this by hand and then broached up to 5.  With the 5 broach in place I chose a high offset neck and did trial reductions.  The offset was appropriate, leg lengths   appeared to be equal best matched with the +1 head ball confirmed radiographically.   Given these findings, I went ahead and dislocated the hip, repositioned all   retractors and positioned the right hip in the extended and abducted position.  The final 5 Hi Tri Lock stem was   chosen and it was impacted down to the level of neck cut.  Based on this   and the trial reduction, a 32+1 delta ceramic ball was chosen and   impacted onto a clean and dry trunnion, and the hip was reduced.  The   hip had been irrigated throughout the case again at this point.  I did   reapproximate the superior capsular leaflet to the anterior leaflet   using #1 Vicryl.  The fascia of the   tensor fascia lata muscle was then reapproximated using #1 Vicryl and #1 Stratfix sutures.  The   remaining wound was closed with 2-0 Vicryl and running 4-0 Monocryl.   The hip was cleaned,  dried, and dressed  sterilely using Dermabond and   Aquacel dressing.  She was then brought   to recovery room in stable condition tolerating the procedure well.    Molli Barrows, PA-C was present for the entirety of the case involved from   preoperative positioning, perioperative retractor management, general   facilitation of the case, as well as primary wound closure as assistant.            Pietro Cassis Alvan Dame, M.D.        05/10/2016 2:19 PM

## 2016-05-10 NOTE — Anesthesia Procedure Notes (Signed)
Procedure Name: Intubation Date/Time: 05/10/2016 1:10 PM Performed by: Glory Buff Pre-anesthesia Checklist: Patient identified, Emergency Drugs available, Suction available and Patient being monitored Patient Re-evaluated:Patient Re-evaluated prior to inductionOxygen Delivery Method: Circle system utilized Preoxygenation: Pre-oxygenation with 100% oxygen Intubation Type: IV induction Ventilation: Mask ventilation without difficulty Laryngoscope Size: Miller and 3 Grade View: Grade I Tube type: Oral Tube size: 7.0 mm Number of attempts: 1 Airway Equipment and Method: Stylet and Oral airway Placement Confirmation: ETT inserted through vocal cords under direct vision,  positive ETCO2 and breath sounds checked- equal and bilateral Secured at: 20 cm Tube secured with: Tape Dental Injury: Teeth and Oropharynx as per pre-operative assessment

## 2016-05-10 NOTE — Anesthesia Preprocedure Evaluation (Addendum)
Anesthesia Evaluation  Patient identified by MRN, date of birth, ID band Patient awake    Reviewed: Allergy & Precautions, NPO status , Patient's Chart, lab work & pertinent test results  History of Anesthesia Complications Negative for: history of anesthetic complications  Airway Mallampati: II  TM Distance: >3 FB Neck ROM: Full    Dental  (+) Teeth Intact   Pulmonary neg shortness of breath, neg sleep apnea, neg COPD, neg recent URI, former smoker,    breath sounds clear to auscultation       Cardiovascular hypertension, Pt. on medications  Rhythm:Regular     Neuro/Psych negative neurological ROS  negative psych ROS   GI/Hepatic Neg liver ROS, GERD  Medicated,  Endo/Other  Hypothyroidism   Renal/GU negative Renal ROS     Musculoskeletal  (+) Arthritis ,   Abdominal   Peds  Hematology negative hematology ROS (+)   Anesthesia Other Findings   Reproductive/Obstetrics                             Anesthesia Physical  Anesthesia Plan  ASA: III  Anesthesia Plan: General   Post-op Pain Management:    Induction: Intravenous  Airway Management Planned: Oral ETT  Additional Equipment:   Intra-op Plan:   Post-operative Plan: Extubation in OR  Informed Consent: I have reviewed the patients History and Physical, chart, labs and discussed the procedure including the risks, benefits and alternatives for the proposed anesthesia with the patient or authorized representative who has indicated his/her understanding and acceptance.   Dental advisory given  Plan Discussed with: CRNA and Surgeon  Anesthesia Plan Comments:        Anesthesia Quick Evaluation

## 2016-05-10 NOTE — Interval H&P Note (Signed)
History and Physical Interval Note:  05/10/2016 12:08 PM  Heidi Adams  has presented today for surgery, with the diagnosis of RIGHT HIP OA  The various methods of treatment have been discussed with the patient and family. After consideration of risks, benefits and other options for treatment, the patient has consented to  Procedure(s): TOTAL HIP ARTHROPLASTY ANTERIOR APPROACH (Right) as a surgical intervention .  The patient's history has been reviewed, patient examined, no change in status, stable for surgery.  I have reviewed the patient's chart and labs.  Questions were answered to the patient's satisfaction.     [name]

## 2016-05-10 NOTE — Transfer of Care (Signed)
Immediate Anesthesia Transfer of Care Note  Patient: Heidi Adams  Procedure(s) Performed: Procedure(s): TOTAL HIP ARTHROPLASTY ANTERIOR APPROACH (Right)  Patient Location: PACU  Anesthesia Type:General  Level of Consciousness: awake, alert  and oriented  Airway & Oxygen Therapy: Patient Spontanous Breathing and Patient connected to face mask oxygen  Post-op Assessment: Report given to RN and Post -op Vital signs reviewed and stable  Post vital signs: Reviewed and stable  Last Vitals:  Vitals:   05/10/16 1110  BP: (!) 169/82  Pulse: 85  Resp: 16  Temp: 36.9 C    Last Pain:  Vitals:   05/10/16 1137  TempSrc:   PainSc: 8       Patients Stated Pain Goal: 3 ([id number])  Complications: No apparent anesthesia complications

## 2016-05-10 NOTE — Anesthesia Postprocedure Evaluation (Signed)
Anesthesia Post Note  Patient: Heidi Adams  Procedure(s) Performed: Procedure(s) (LRB): TOTAL HIP ARTHROPLASTY ANTERIOR APPROACH (Right)  Patient location during evaluation: PACU Anesthesia Type: General Level of consciousness: sedated Pain management: pain level controlled Vital Signs Assessment: post-procedure vital signs reviewed and stable Respiratory status: spontaneous breathing and respiratory function stable Cardiovascular status: stable Anesthetic complications: no    Last Vitals:  Vitals:   05/10/16 1110 05/10/16 1446  BP: (!) 169/82 (!) 157/69  Pulse: 85 80  Resp: 16 11  Temp: 36.9 C 37.1 C    Last Pain:  Vitals:   05/10/16 1545  TempSrc:   PainSc: Asleep                 [name]

## 2016-05-11 ENCOUNTER — Encounter (HOSPITAL_COMMUNITY): Payer: Self-pay | Admitting: Orthopedic Surgery

## 2016-05-11 DIAGNOSIS — E663 Overweight: Secondary | ICD-10-CM | POA: Diagnosis present

## 2016-05-11 DIAGNOSIS — R269 Unspecified abnormalities of gait and mobility: Secondary | ICD-10-CM | POA: Diagnosis not present

## 2016-05-11 DIAGNOSIS — M179 Osteoarthritis of knee, unspecified: Secondary | ICD-10-CM | POA: Diagnosis not present

## 2016-05-11 LAB — BASIC METABOLIC PANEL
ANION GAP: 6 (ref 5–15)
BUN: 15 mg/dL (ref 6–20)
CALCIUM: 8.5 mg/dL — AB (ref 8.9–10.3)
CO2: 27 mmol/L (ref 22–32)
Chloride: 105 mmol/L (ref 101–111)
Creatinine, Ser: 0.7 mg/dL (ref 0.44–1.00)
GLUCOSE: 152 mg/dL — AB (ref 65–99)
POTASSIUM: 3.5 mmol/L (ref 3.5–5.1)
Sodium: 138 mmol/L (ref 135–145)

## 2016-05-11 LAB — CBC
HEMATOCRIT: 26.5 % — AB (ref 36.0–46.0)
Hemoglobin: 8.5 g/dL — ABNORMAL LOW (ref 12.0–15.0)
MCH: 28.1 pg (ref 26.0–34.0)
MCHC: 32.1 g/dL (ref 30.0–36.0)
MCV: 87.7 fL (ref 78.0–100.0)
PLATELETS: 318 10*3/uL (ref 150–400)
RBC: 3.02 MIL/uL — AB (ref 3.87–5.11)
RDW: 13.5 % (ref 11.5–15.5)
WBC: 13.4 10*3/uL — AB (ref 4.0–10.5)

## 2016-05-11 MED ORDER — ASPIRIN 81 MG PO TBEC: 81.0000 mg | DELAYED_RELEASE_TABLET | Freq: Two times a day (BID) | ORAL | 0 refills | Status: AC

## 2016-05-11 MED ORDER — ASPIRIN EC 81 MG PO TBEC
81.0000 mg | DELAYED_RELEASE_TABLET | Freq: Two times a day (BID) | ORAL | Status: DC
  Administered 2016-05-11 – 2016-05-12 (×3): 81 mg via ORAL
  Filled 2016-05-11 (×3): qty 1

## 2016-05-11 MED ORDER — DOCUSATE SODIUM 100 MG PO CAPS: 100.0000 mg | ORAL_CAPSULE | Freq: Two times a day (BID) | ORAL | 0 refills | Status: DC

## 2016-05-11 MED ORDER — METHOCARBAMOL 500 MG PO TABS: 500.0000 mg | ORAL_TABLET | Freq: Four times a day (QID) | ORAL | 0 refills | Status: DC | PRN

## 2016-05-11 MED ORDER — OXYCODONE HCL 5 MG PO TABS: 5.0000 mg | ORAL_TABLET | ORAL | 0 refills | Status: DC | PRN

## 2016-05-11 MED ORDER — NONFORMULARY OR COMPOUNDED ITEM: 5.0000 mg | Status: DC

## 2016-05-11 MED ORDER — POLYETHYLENE GLYCOL 3350 17 G PO PACK: 17.0000 g | PACK | Freq: Two times a day (BID) | ORAL | 0 refills | Status: DC

## 2016-05-11 MED ORDER — ACETAMINOPHEN 500 MG PO TABS: 1000.0000 mg | ORAL_TABLET | Freq: Three times a day (TID) | ORAL | 0 refills | Status: DC

## 2016-05-11 MED ORDER — NONFORMULARY OR COMPOUNDED ITEM
5.0000 mg | Status: DC
  Administered 2016-05-11: 5 mg via ORAL

## 2016-05-11 MED ORDER — FERROUS SULFATE 325 (65 FE) MG PO TABS: 325.0000 mg | ORAL_TABLET | Freq: Three times a day (TID) | ORAL | 3 refills | Status: DC

## 2016-05-11 NOTE — Care Management Note (Signed)
Case Management Note  Patient Details  Name: Heidi Adams MRN: [id number] Date of Birth: Jul 03, 1936  Subjective/Objective:                  TOTAL HIP ARTHROPLASTY ANTERIOR APPROACH (Right)  Action/Plan: Discharge planning Expected Discharge Date:                  Expected Discharge Plan:  [place]  In-House Referral:     Discharge planning Services  CM Consult  Post Acute Care Choice:  Home Health Choice offered to:  Patient  DME Arranged:  Walker rolling DME Agency:  [place]:  PT [place] Agency:  [facility] ([facility]  Status of Service:  Completed, signed off  If discussed at [employer] of Stay Meetings, dates discussed:    Additional Comments: CM met with pt in room to offer choice of home health agency.  Pt chooses [facility] to render HHPT.  Referral given to [facility] rep, Tim. Pt states she has a 3n1 but gave away her rolling walker and verbalized understanding she may have to pay out of pocket.  CM notified [facility] DME rep, of need for rolling walker and willingness to play out of pocket and to please deliver the rolling walker prior to discharge.  No other CM needs were communicated. Dellie Catholic, RN 05/11/2016, 11:11 AM

## 2016-05-11 NOTE — Evaluation (Signed)
Physical Therapy Evaluation Patient Details Name: Heidi Adams MRN: [id number] DOB: 1935-08-20 Today's Date: 05/11/2016   History of Present Illness  Pt s/p R THR and with hx of recent L TKR (3/17)  Clinical Impression  Pt s/p R THR presents with decreased R LE strength/ROM and post op pain limiting functional mobility.  Pt should progress to dc home with family assist and HHPT follow up.    Follow Up Recommendations Home health PT    Equipment Recommendations  Rolling walker with 5" wheels    Recommendations for Other Services OT consult     Precautions / Restrictions Precautions Precautions: Fall Restrictions Weight Bearing Restrictions: No Other Position/Activity Restrictions: WBAT      Mobility  Bed Mobility Overal bed mobility: Needs Assistance Bed Mobility: Supine to Sit     Supine to sit: Min assist     General bed mobility comments: cues for sequence and use of L LE to self assist  Transfers Overall transfer level: Needs assistance Equipment used: Rolling walker (2 wheeled) Transfers: Sit to/from Stand Sit to Stand: Min assist         General transfer comment: cues for LE management and use of UEs to self assist  Ambulation/Gait Ambulation/Gait assistance: Min assist Ambulation Distance (Feet): 75 Feet Assistive device: Rolling walker (2 wheeled) Gait Pattern/deviations: Step-to pattern;Step-through pattern;Decreased step length - right;Decreased step length - left;Shuffle;Trunk flexed Gait velocity: decr Gait velocity interpretation: Below normal speed for age/gender General Gait Details: cues for posture, position from RW and initial sequence.    Stairs            Wheelchair Mobility    Modified Rankin (Stroke Patients Only)       Balance                                             Pertinent Vitals/Pain Pain Assessment: 0-10 Pain Score: 4  Pain Location: R hip/thigh Pain Descriptors / Indicators:  Aching;Sore Pain Intervention(s): Limited activity within patient's tolerance;Monitored during session;Premedicated before session;Ice applied    Home Living Family/patient expects to be discharged to:: Private residence Living Arrangements: Spouse/significant other Available Help at Discharge: Available PRN/intermittently Type of Home: House Home Access: Stairs to enter Entrance Stairs-Rails: None Entrance Stairs-Number of Steps: 2+1 Home Layout: One level Home Equipment: None      Prior Function Level of Independence: Independent               Hand Dominance        Extremity/Trunk Assessment   Upper Extremity Assessment: Overall WFL for tasks assessed           Lower Extremity Assessment: RLE deficits/detail RLE Deficits / Details: Strength at hip 2+/5 with AAROM at hip to 90 flex and 20 abd    Cervical / Trunk Assessment: Normal  Communication   Communication: No difficulties  Cognition Arousal/Alertness: Awake/alert Behavior During Therapy: WFL for tasks assessed/performed Overall Cognitive Status: Within Functional Limits for tasks assessed                      General Comments      Exercises Total Joint Exercises Ankle Circles/Pumps: AROM;Both;20 reps;Supine Quad Sets: AROM;Both;10 reps;Supine Heel Slides: AAROM;Right;20 reps;Supine Hip ABduction/ADduction: AAROM;Right;15 reps;Supine   Assessment/Plan    PT Assessment Patient needs continued PT services  PT Problem List Decreased strength;Decreased range of  motion;Decreased activity tolerance;Decreased mobility;Decreased knowledge of use of DME;Pain;Decreased knowledge of precautions          PT Treatment Interventions DME instruction;Gait training;Stair training;Functional mobility training;Therapeutic activities;Therapeutic exercise;Patient/family education    PT Goals (Current goals can be found in the Care Plan section)  Acute Rehab PT Goals Patient Stated Goal: Regain IND PT  Goal Formulation: With patient Time For Goal Achievement: 05/13/16 Potential to Achieve Goals: Good    Frequency 7X/week   Barriers to discharge        Co-evaluation               End of Session Equipment Utilized During Treatment: Gait belt Activity Tolerance: Patient tolerated treatment well Patient left: in chair;with call bell/phone within reach Nurse Communication: Mobility status         Time: 0823-0908 PT Time Calculation (min) (ACUTE ONLY): 45 min   Charges:   PT Evaluation $PT Eval Low Complexity: 1 Procedure PT Treatments $Gait Training: 8-22 mins $Therapeutic Exercise: 8-22 mins   PT G Codes:        Vetta Couzens 05-27-16, 12:04 PM

## 2016-05-11 NOTE — Progress Notes (Signed)
     Subjective: 1 Day Post-Op Procedure(s) (LRB): TOTAL HIP ARTHROPLASTY ANTERIOR APPROACH (Right)   Patient reports pain as mild, pain controlled. Worried about how she will do with PT.  Planning on discharge home once ready.  Objective:   VITALS:   Vitals:   05/11/16 0245 05/11/16 0543  BP: 109/60 133/70  Pulse: 72 73  Resp: 18 16  Temp: 98.4 F (36.9 C) 98.5 F (36.9 C)    Dorsiflexion/Plantar flexion intact Incision: dressing C/D/I No cellulitis present Compartment soft  LABS  Recent Labs  05/11/16 0416  HGB 8.5*  HCT 26.5*  WBC 13.4*  PLT 318     Recent Labs  05/11/16 0416  NA 138  K 3.5  BUN 15  CREATININE 0.70  GLUCOSE 152*     Assessment/Plan: 1 Day Post-Op Procedure(s) (LRB): TOTAL HIP ARTHROPLASTY ANTERIOR APPROACH (Right) Keep foley cath today, pull tomorrow morning.  Advance diet Up with therapy D/C IV fluids Discharge home with home health, when ready  Overweight (BMI 25-29.9) Estimated body mass index is 25.23 kg/m as calculated from the following:   Height as of this encounter: 5\' 4"  (1.626 m).   Weight as of this encounter: 66.7 kg (147 lb). Patient also counseled that weight may inhibit the healing process Patient counseled that losing weight will help with future health issues      [place]. Yailyn Strack   PAC  05/11/2016, 9:42 AM

## 2016-05-11 NOTE — Progress Notes (Signed)
Physical Therapy Treatment Patient Details Name: Heidi Adams MRN: [id number] DOB: 03-19-1936 Today's Date: 05/11/2016    History of Present Illness Pt s/p R THR and with hx of recent L TKR (3/17)    PT Comments    Pt progressing well with mobility but repeatedly expressing concern that one leg is longer than the other.  Follow Up Recommendations  Home health PT     Equipment Recommendations  Rolling walker with 5" wheels    Recommendations for Other Services OT consult     Precautions / Restrictions Precautions Precautions: Fall Restrictions Weight Bearing Restrictions: No Other Position/Activity Restrictions: WBAT    Mobility  Bed Mobility Overal bed mobility: Needs Assistance Bed Mobility: Sit to Supine       Sit to supine: Min assist   General bed mobility comments: cues for sequence and use of L LE to self assist  Transfers Overall transfer level: Needs assistance Equipment used: Rolling walker (2 wheeled) Transfers: Sit to/from Stand Sit to Stand: Min assist         General transfer comment: cues for LE management and use of UEs to self assist  Ambulation/Gait Ambulation/Gait assistance: Min assist;Min guard Ambulation Distance (Feet): 300 Feet Assistive device: Rolling walker (2 wheeled) Gait Pattern/deviations: Step-to pattern;Step-through pattern;Decreased step length - right;Decreased step length - left;Shuffle;Trunk flexed Gait velocity: decr Gait velocity interpretation: Below normal speed for age/gender General Gait Details: cues for posture, position from RW and initial sequence.     Stairs            Wheelchair Mobility    Modified Rankin (Stroke Patients Only)       Balance                                    Cognition Arousal/Alertness: Awake/alert Behavior During Therapy: WFL for tasks assessed/performed Overall Cognitive Status: Within Functional Limits for tasks assessed                      Exercises      General Comments        Pertinent Vitals/Pain Pain Assessment: 0-10 Pain Score: 2  Pain Location: R hip/thigh Pain Descriptors / Indicators: Aching;Sore Pain Intervention(s): Limited activity within patient's tolerance;Monitored during session;Ice applied    Home Living Family/patient expects to be discharged to:: Private residence Living Arrangements: Spouse/significant other Available Help at Discharge: Available PRN/intermittently Type of Home: House Home Access: Stairs to enter Entrance Stairs-Rails: None Home Layout: One level Home Equipment: Bedside commode Additional Comments: reports she needs RW; case manager made aware    Prior Function Level of Independence: Independent          PT Goals (current goals can now be found in the care plan section) Acute Rehab PT Goals Patient Stated Goal: Regain IND PT Goal Formulation: With patient Time For Goal Achievement: 05/13/16 Potential to Achieve Goals: Good Progress towards PT goals: Progressing toward goals    Frequency    7X/week      PT Plan Current plan remains appropriate    Co-evaluation             End of Session Equipment Utilized During Treatment: Gait belt Activity Tolerance: Patient tolerated treatment well Patient left: in bed;with call bell/phone within reach;with bed alarm set     Time: [id number] PT Time Calculation (min) (ACUTE ONLY): 26 min  Charges:  $Gait Training: 23-37 mins  G Codes:      Brenan Modesto 2016-06-02, 3:26 PM

## 2016-05-11 NOTE — Progress Notes (Signed)
Occupational Therapy Evaluation Patient Details Name: Heidi Adams MRN: [id number] DOB: 1936-05-10 Today's Date: 05/11/2016    History of Present Illness Pt s/p R THR and with hx of recent L TKR (3/17)   Clinical Impression   All OT education completed and pt questions answered. No further OT needs identified; will sign off.    Follow Up Recommendations  No OT follow up;Supervision - Intermittent    Equipment Recommendations  None recommended by OT;Other (comment) (informed case manager that pt needed RW)    Recommendations for Other Services       Precautions / Restrictions Precautions Precautions: Fall Restrictions Weight Bearing Restrictions: No Other Position/Activity Restrictions: WBAT      Mobility Bed Mobility              Transfers                 Balance                                            ADL Overall ADL's : Needs assistance/impaired                                       General ADL Comments: Reviewed and demonstrated LB dressing techniques with patient. She verbalized understanding. Verbal education and demonstration of walk-in shower transfer. Patient verbalized understanding but declined to practice, stating she recalls how to perform after her previous [name]. Patient reports she has been toileting with nurse tech without difficulty. Reminded pt to put 3 in 1 over toilet at home. Patient verbalized understanding.     Vision     Perception     Praxis      Pertinent Vitals/Pain Pain Assessment: 0-10 Pain Score: 3  Pain Location: R hip/thigh Pain Descriptors / Indicators: Aching;Sore Pain Intervention(s): Limited activity within patient's tolerance;Monitored during session     Hand Dominance     Extremity/Trunk Assessment Upper Extremity Assessment Upper Extremity Assessment: Overall WFL for tasks assessed   Lower Extremity Assessment Lower Extremity Assessment: Defer to PT  evaluation    Cervical / Trunk Assessment Cervical / Trunk Assessment: Normal   Communication Communication Communication: No difficulties   Cognition Arousal/Alertness: Awake/alert Behavior During Therapy: WFL for tasks assessed/performed Overall Cognitive Status: Within Functional Limits for tasks assessed                     General Comments       Exercises      Shoulder Instructions      Home Living Family/patient expects to be discharged to:: Private residence Living Arrangements: Spouse/significant other Available Help at Discharge: Available PRN/intermittently Type of Home: House Home Access: Stairs to enter Technical brewer of Steps: 2+1 Entrance Stairs-Rails: None Home Layout: One level     Bathroom Shower/Tub: Occupational psychologist: Standard     Home Equipment: Bedside commode   Additional Comments: reports she needs RW; case manager made aware      Prior Functioning/Environment Level of Independence: Independent                 OT Problem List: Decreased strength;Decreased activity tolerance;Decreased range of motion;Decreased knowledge of use of DME or AE;Pain   OT Treatment/Interventions:      OT  Goals(Current goals can be found in the care plan section) Acute Rehab OT Goals Patient Stated Goal: Regain IND OT Goal Formulation: All assessment and education complete, DC therapy  OT Frequency:     Barriers to D/C:            Co-evaluation              End of Session    Activity Tolerance: Patient tolerated treatment well Patient left: in chair;with call bell/phone within reach   Time: 0920-0933 OT Time Calculation (min): 13 min Charges:  OT General Charges $OT Visit: 1 Procedure OT Evaluation $OT Eval Low Complexity: 1 Procedure G-Codes:    Heidi Adams A 05/28/16, 12:22 PM

## 2016-05-11 NOTE — Discharge Instructions (Signed)

## 2016-05-12 LAB — CBC
HCT: 26.3 % — ABNORMAL LOW (ref 36.0–46.0)
HEMOGLOBIN: 8.2 g/dL — AB (ref 12.0–15.0)
MCH: 28.5 pg (ref 26.0–34.0)
MCHC: 31.2 g/dL (ref 30.0–36.0)
MCV: 91.3 fL (ref 78.0–100.0)
Platelets: 298 10*3/uL (ref 150–400)
RBC: 2.88 MIL/uL — ABNORMAL LOW (ref 3.87–5.11)
RDW: 14 % (ref 11.5–15.5)
WBC: 11.6 10*3/uL — ABNORMAL HIGH (ref 4.0–10.5)

## 2016-05-12 LAB — BASIC METABOLIC PANEL
Anion gap: 4 — ABNORMAL LOW (ref 5–15)
BUN: 18 mg/dL (ref 6–20)
CHLORIDE: 105 mmol/L (ref 101–111)
CO2: 29 mmol/L (ref 22–32)
CREATININE: 0.65 mg/dL (ref 0.44–1.00)
Calcium: 8.6 mg/dL — ABNORMAL LOW (ref 8.9–10.3)
GFR calc Af Amer: >60 mL/min (ref >60)
GFR calc non Af Amer: >60 mL/min (ref >60)
GLUCOSE: 96 mg/dL (ref 65–99)
Potassium: 3.5 mmol/L (ref 3.5–5.1)
SODIUM: 138 mmol/L (ref 135–145)

## 2016-05-12 NOTE — Progress Notes (Signed)
     Subjective: 2 Days Post-Op Procedure(s) (LRB): TOTAL HIP ARTHROPLASTY ANTERIOR APPROACH (Right)   Patient reports pain as mild, pain controlled. Feels that the pain is already much better than it was prior to surgery.  Questions were encouraged and answered.  Ready to be discharged home.   Objective:   VITALS:   Vitals:   05/11/16 2129 05/12/16 0541  BP: (!) 115/52 138/60  Pulse: 72 76  Resp: 16 16  Temp: 98.9 F (37.2 C) 99.3 F (37.4 C)    Dorsiflexion/Plantar flexion intact Incision: dressing C/D/I No cellulitis present Compartment soft  LABS  Recent Labs  05/11/16 0416 05/12/16 0420  HGB 8.5* 8.2*  HCT 26.5* 26.3*  WBC 13.4* 11.6*  PLT 318 298     Recent Labs  05/11/16 0416 05/12/16 0420  NA 138 138  K 3.5 3.5  BUN 15 18  CREATININE 0.70 0.65  GLUCOSE 152* 96     Assessment/Plan: 2 Days Post-Op Procedure(s) (LRB): TOTAL HIP ARTHROPLASTY ANTERIOR APPROACH (Right) Up with therapy Discharge home with home health  Follow up in 2 weeks at [facility]. Follow up with OLIN,Lory Galan D in 2 weeks.  Contact information:  [facility] [address], [address] [place]   PAC  05/12/2016, 8:42 AM

## 2016-05-12 NOTE — Progress Notes (Signed)
Physical Therapy Treatment Patient Details Name: SHALAUNDA SUBASIC MRN: [id number] DOB: June 21, 1936 Today's Date: 05/12/2016    History of Present Illness Pt s/p R THR and with hx of recent L TKR (3/17)    PT Comments    POD # 2  With spouse present assisted with amb, stair training and HEP.  Follow Up Recommendations  Home health PT     Equipment Recommendations  Rolling walker with 5" wheels    Recommendations for Other Services       Precautions / Restrictions Precautions Precautions: Fall Restrictions Weight Bearing Restrictions: No Other Position/Activity Restrictions: WBAT    Mobility  Bed Mobility Overal bed mobility: Needs Assistance Bed Mobility: Supine to Sit     Supine to sit: Supervision;Min guard     General bed mobility comments: cues for sequence and use of L LE to self assist  Transfers Overall transfer level: Needs assistance Equipment used: Rolling walker (2 wheeled) Transfers: Sit to/from Stand Sit to Stand: Supervision;Min guard         General transfer comment: cues for LE management and use of UEs to self assist  Ambulation/Gait Ambulation/Gait assistance: Supervision;Min guard Ambulation Distance (Feet): 75 Feet Assistive device: Rolling walker (2 wheeled) Gait Pattern/deviations: Step-to pattern Gait velocity: decr   General Gait Details: cues for posture, position from RW and initial sequence.     Stairs Stairs: Yes Stairs assistance: Min guard Stair Management: No rails;Step to pattern;Forwards;With walker Number of Stairs: 2 General stair comments: with spouse present and 25% VC's on proper sequencing and tech  Wheelchair Mobility    Modified Rankin (Stroke Patients Only)       Balance                                    Cognition Arousal/Alertness: Awake/alert Behavior During Therapy: WFL for tasks assessed/performed Overall Cognitive Status: Within Functional Limits for tasks assessed                       Exercises      General Comments        Pertinent Vitals/Pain Pain Assessment: 0-10 Pain Score: 4  Pain Location: R hip Pain Descriptors / Indicators: Aching;Sore Pain Intervention(s): Monitored during session;Repositioned    Home Living                      Prior Function            PT Goals (current goals can now be found in the care plan section) Progress towards PT goals: Progressing toward goals    Frequency    7X/week      PT Plan Current plan remains appropriate    Co-evaluation             End of Session Equipment Utilized During Treatment: Gait belt Activity Tolerance: Patient tolerated treatment well Patient left: in bed;with call bell/phone within reach;with bed alarm set     Time: 0915-1000 PT Time Calculation (min) (ACUTE ONLY): 45 min  Charges:  $Gait Training: 8-22 mins $Therapeutic Exercise: 8-22 mins $Therapeutic Activity: 8-22 mins                    G Codes:      Rica Koyanagi  PTA [facility]  Acute  Rehab Pager      [phone]

## 2016-05-13 DIAGNOSIS — Z96641 Presence of right artificial hip joint: Secondary | ICD-10-CM | POA: Diagnosis not present

## 2016-05-13 DIAGNOSIS — I493 Ventricular premature depolarization: Secondary | ICD-10-CM | POA: Diagnosis not present

## 2016-05-13 DIAGNOSIS — Z6825 Body mass index (BMI) 25.0-25.9, adult: Secondary | ICD-10-CM | POA: Diagnosis not present

## 2016-05-13 DIAGNOSIS — Z471 Aftercare following joint replacement surgery: Secondary | ICD-10-CM | POA: Diagnosis not present

## 2016-05-13 DIAGNOSIS — Z96652 Presence of left artificial knee joint: Secondary | ICD-10-CM | POA: Diagnosis not present

## 2016-05-13 DIAGNOSIS — I10 Essential (primary) hypertension: Secondary | ICD-10-CM | POA: Diagnosis not present

## 2016-05-13 DIAGNOSIS — Z87891 Personal history of nicotine dependence: Secondary | ICD-10-CM | POA: Diagnosis not present

## 2016-05-13 DIAGNOSIS — E890 Postprocedural hypothyroidism: Secondary | ICD-10-CM | POA: Diagnosis not present

## 2016-05-13 DIAGNOSIS — H8111 Benign paroxysmal vertigo, right ear: Secondary | ICD-10-CM | POA: Diagnosis not present

## 2016-05-13 DIAGNOSIS — E663 Overweight: Secondary | ICD-10-CM | POA: Diagnosis not present

## 2016-05-13 DIAGNOSIS — Z79891 Long term (current) use of opiate analgesic: Secondary | ICD-10-CM | POA: Diagnosis not present

## 2016-05-13 DIAGNOSIS — Z7982 Long term (current) use of aspirin: Secondary | ICD-10-CM | POA: Diagnosis not present

## 2016-05-13 NOTE — Discharge Summary (Signed)
Physician Discharge Summary  Patient ID: Heidi Adams MRN: [id number] DOB/AGE: 08/22/1935 80 y.o.  Admit date: 05/10/2016 Discharge date: 05/12/2016   Procedures:  Procedure(s) (LRB): TOTAL HIP ARTHROPLASTY ANTERIOR APPROACH (Right)  Attending Physician:  Dr. Paralee Cancel   Admission Diagnoses:   Right hip primary OA / pain  Discharge Diagnoses:  Principal Problem:   S/P right THA, AA Active Problems:   Overweight (BMI 25.0-29.9)  Past Medical History:  Diagnosis Date  . Arthritis   . GERD (gastroesophageal reflux disease)   . History of skin cancer   . Hypertension   . PVC (premature ventricular contraction)   . Thyroiditis 1973    HPI:     Heidi Adams, 80 y.o. female, has a history of pain and functional disability in the right hip(s) due to arthritis and patient has failed non-surgical conservative treatments for greater than 12 weeks to include NSAID's and/or analgesics, corticosteriod injections, use of assistive devices and activity modification.  Onset of symptoms was gradual starting 2+ years ago with gradually worsening course since that time.The patient noted no past surgery on the right hip(s).  Patient currently rates pain in the right hip at 10 out of 10 with activity. Patient has night pain, worsening of pain with activity and weight bearing, trendelenberg gait, pain that interfers with activities of daily living and pain with passive range of motion. Patient has evidence of periarticular osteophytes and joint space narrowing by imaging studies. This condition presents safety issues increasing the risk of falls. There is no current active infection.   Risks, benefits and expectations were discussed with the patient.  Risks including but not limited to the risk of anesthesia, blood clots, nerve damage, blood vessel damage, failure of the prosthesis, infection and up to and including death.  Patient understand the risks, benefits and expectations and wishes to  proceed with surgery.   PCP: Precious Reel, MD   Discharged Condition: good  Hospital Course:  Patient underwent the above stated procedure on 05/10/2016. Patient tolerated the procedure well and brought to the recovery room in good condition and subsequently to the floor.  POD #1 BP: 133/70 ; Pulse: 73 ; Temp: 98.5 F (36.9 C) ; Resp: 16 Patient reports pain as mild, pain controlled. Worried about how she will do with PT.  Planning on discharge home once ready. Dorsiflexion/plantar flexion intact, incision: dressing C/D/I, no cellulitis present and compartment soft.   LABS  Basename    HGB     8.5  HCT     26.5   POD #2  BP: 138/60 ; Pulse: 76 ; Temp: 99.3 F (37.4 C) ; Resp: 16 Patient reports pain as mild, pain controlled. Feels that the pain is already much better than it was prior to surgery.  Questions were encouraged and answered.  Ready to be discharged home. Dorsiflexion/plantar flexion intact, incision: dressing C/D/I, no cellulitis present and compartment soft.   LABS  Basename    HGB     8.2  HCT     26.3    Discharge Exam: General appearance: alert, cooperative and no distress Extremities: Homans sign is negative, no sign of DVT, no edema, redness or tenderness in the calves or thighs and no ulcers, gangrene or trophic changes  Disposition: Home with follow up in 2 weeks   Follow-up Information    [facility] .   Specialty:  Home Health Services Why:  home health physical therapy Contact information: [place]  [address] [place] [address] [phone]        [facility] - [facility] .   Why:  rolling walker Contact information: [address] [place] [address] [phone]        [name], MD. Schedule an appointment as soon as possible for a visit in 2 week(s).   Specialty:  Orthopedic Surgery Contact information: [address] [place] [address] [id number]           Discharge  Instructions    Call MD / Call 911    Complete by:  As directed    If you experience chest pain or shortness of breath, CALL 911 and be transported to the hospital emergency room.  If you develope a fever above 101 F, pus (white drainage) or increased drainage or redness at the wound, or calf pain, call your surgeon's office.   Change dressing    Complete by:  As directed    Maintain surgical dressing until follow up in the clinic. If the edges start to pull up, may reinforce with tape. If the dressing is no longer working, may remove and cover with gauze and tape, but must keep the area dry and clean.  Call with any questions or concerns.   Constipation Prevention    Complete by:  As directed    Drink plenty of fluids.  Prune juice may be helpful.  You may use a stool softener, such as Colace (over the counter) 100 mg twice a day.  Use MiraLax (over the counter) for constipation as needed.   Diet - low sodium heart healthy    Complete by:  As directed    Discharge instructions    Complete by:  As directed    Maintain surgical dressing until follow up in the clinic. If the edges start to pull up, may reinforce with tape. If the dressing is no longer working, may remove and cover with gauze and tape, but must keep the area dry and clean.  Follow up in 2 weeks at [facility]. Call with any questions or concerns.   Increase activity slowly as tolerated    Complete by:  As directed    Weight bearing as tolerated with assist device (walker, cane, etc) as directed, use it as long as suggested by your surgeon or therapist, typically at least 4-6 weeks.   TED hose    Complete by:  As directed    Use stockings (TED hose) for 2 weeks on both leg(s).  You may remove them at night for sleeping.        Medication List    STOP taking these medications   naproxen sodium 220 MG tablet Commonly known as:  ANAPROX   traMADol 50 MG tablet Commonly known as:  ULTRAM     TAKE these  medications   acetaminophen 500 MG tablet Commonly known as:  TYLENOL Take 2 tablets (1,000 mg total) by mouth every 8 (eight) hours. What changed:  when to take this  reasons to take this   amLODipine 5 MG tablet Commonly known as:  NORVASC Take 5 mg by mouth 2 (two) times daily.   aspirin 81 MG EC tablet Take 1 tablet (81 mg total) by mouth 2 (two) times daily. Take for 4 weeks.   docusate sodium 100 MG capsule Commonly known as:  COLACE Take 1 capsule (100 mg total) by mouth 2 (two) times daily. What changed:  when to take this  reasons to  take this   ferrous sulfate 325 (65 FE) MG tablet Take 1 tablet (325 mg total) by mouth 3 (three) times daily after meals.   levothyroxine 112 MCG tablet Commonly known as:  SYNTHROID, LEVOTHROID Take 112 mcg by mouth daily before breakfast.   methocarbamol 500 MG tablet Commonly known as:  ROBAXIN Take 1 tablet (500 mg total) by mouth every 6 (six) hours as needed for muscle spasms.   nadolol 20 MG tablet Commonly known as:  CORGARD Take 5 mg by mouth daily. evening   oxyCODONE 5 MG immediate release tablet Commonly known as:  Oxy IR/ROXICODONE Take 1-3 tablets (5-15 mg total) by mouth every 4 (four) hours as needed for severe pain.   pantoprazole 40 MG tablet Commonly known as:  PROTONIX Take 40 mg by mouth daily as needed (indigestion).   polyethylene glycol packet Commonly known as:  MIRALAX / GLYCOLAX Take 17 g by mouth 2 (two) times daily.        Signed: West Pugh. Gisell Buehrle   PA-C  05/13/2016, 8:56 AM

## 2016-05-16 DIAGNOSIS — E039 Hypothyroidism, unspecified: Secondary | ICD-10-CM | POA: Diagnosis not present

## 2016-05-16 DIAGNOSIS — Z96649 Presence of unspecified artificial hip joint: Secondary | ICD-10-CM | POA: Diagnosis not present

## 2016-05-16 DIAGNOSIS — I10 Essential (primary) hypertension: Secondary | ICD-10-CM | POA: Diagnosis not present

## 2016-05-16 DIAGNOSIS — I959 Hypotension, unspecified: Secondary | ICD-10-CM | POA: Diagnosis not present

## 2016-05-16 DIAGNOSIS — Z87891 Personal history of nicotine dependence: Secondary | ICD-10-CM | POA: Diagnosis not present

## 2016-05-16 DIAGNOSIS — I4891 Unspecified atrial fibrillation: Secondary | ICD-10-CM | POA: Diagnosis not present

## 2016-05-16 DIAGNOSIS — Z23 Encounter for immunization: Secondary | ICD-10-CM | POA: Diagnosis not present

## 2016-05-16 DIAGNOSIS — I493 Ventricular premature depolarization: Secondary | ICD-10-CM | POA: Diagnosis not present

## 2016-05-16 DIAGNOSIS — E538 Deficiency of other specified B group vitamins: Secondary | ICD-10-CM | POA: Diagnosis not present

## 2016-05-16 DIAGNOSIS — R404 Transient alteration of awareness: Secondary | ICD-10-CM | POA: Diagnosis not present

## 2016-05-16 DIAGNOSIS — Z7982 Long term (current) use of aspirin: Secondary | ICD-10-CM | POA: Diagnosis not present

## 2016-05-16 DIAGNOSIS — D72829 Elevated white blood cell count, unspecified: Secondary | ICD-10-CM | POA: Diagnosis not present

## 2016-05-16 DIAGNOSIS — R42 Dizziness and giddiness: Secondary | ICD-10-CM | POA: Diagnosis not present

## 2016-05-16 DIAGNOSIS — R002 Palpitations: Secondary | ICD-10-CM | POA: Diagnosis not present

## 2016-05-16 DIAGNOSIS — I491 Atrial premature depolarization: Secondary | ICD-10-CM | POA: Diagnosis not present

## 2016-05-16 DIAGNOSIS — E876 Hypokalemia: Secondary | ICD-10-CM | POA: Diagnosis not present

## 2016-05-16 DIAGNOSIS — R531 Weakness: Secondary | ICD-10-CM | POA: Diagnosis not present

## 2016-05-17 DIAGNOSIS — E039 Hypothyroidism, unspecified: Secondary | ICD-10-CM | POA: Diagnosis not present

## 2016-05-17 DIAGNOSIS — D519 Vitamin B12 deficiency anemia, unspecified: Secondary | ICD-10-CM | POA: Diagnosis not present

## 2016-05-17 DIAGNOSIS — I4891 Unspecified atrial fibrillation: Secondary | ICD-10-CM | POA: Diagnosis not present

## 2016-05-17 DIAGNOSIS — I10 Essential (primary) hypertension: Secondary | ICD-10-CM | POA: Diagnosis not present

## 2016-05-17 DIAGNOSIS — E876 Hypokalemia: Secondary | ICD-10-CM | POA: Diagnosis not present

## 2016-05-17 DIAGNOSIS — D72829 Elevated white blood cell count, unspecified: Secondary | ICD-10-CM | POA: Diagnosis not present

## 2016-05-18 DIAGNOSIS — I4891 Unspecified atrial fibrillation: Secondary | ICD-10-CM | POA: Diagnosis not present

## 2016-05-18 DIAGNOSIS — I491 Atrial premature depolarization: Secondary | ICD-10-CM | POA: Diagnosis not present

## 2016-05-18 DIAGNOSIS — E538 Deficiency of other specified B group vitamins: Secondary | ICD-10-CM | POA: Diagnosis not present

## 2016-05-18 DIAGNOSIS — I10 Essential (primary) hypertension: Secondary | ICD-10-CM | POA: Diagnosis not present

## 2016-05-18 DIAGNOSIS — I480 Paroxysmal atrial fibrillation: Secondary | ICD-10-CM | POA: Diagnosis not present

## 2016-05-18 DIAGNOSIS — D519 Vitamin B12 deficiency anemia, unspecified: Secondary | ICD-10-CM | POA: Diagnosis not present

## 2016-05-18 DIAGNOSIS — E876 Hypokalemia: Secondary | ICD-10-CM | POA: Diagnosis not present

## 2016-05-18 DIAGNOSIS — I959 Hypotension, unspecified: Secondary | ICD-10-CM | POA: Diagnosis not present

## 2016-05-18 DIAGNOSIS — Z7982 Long term (current) use of aspirin: Secondary | ICD-10-CM | POA: Diagnosis not present

## 2016-05-18 DIAGNOSIS — Z23 Encounter for immunization: Secondary | ICD-10-CM | POA: Diagnosis not present

## 2016-05-18 DIAGNOSIS — D72829 Elevated white blood cell count, unspecified: Secondary | ICD-10-CM | POA: Diagnosis not present

## 2016-05-18 DIAGNOSIS — Z87891 Personal history of nicotine dependence: Secondary | ICD-10-CM | POA: Diagnosis not present

## 2016-05-18 DIAGNOSIS — I493 Ventricular premature depolarization: Secondary | ICD-10-CM | POA: Diagnosis not present

## 2016-05-18 DIAGNOSIS — E039 Hypothyroidism, unspecified: Secondary | ICD-10-CM | POA: Diagnosis not present

## 2016-05-18 DIAGNOSIS — Z96649 Presence of unspecified artificial hip joint: Secondary | ICD-10-CM | POA: Diagnosis not present

## 2016-05-19 DIAGNOSIS — I493 Ventricular premature depolarization: Secondary | ICD-10-CM | POA: Diagnosis not present

## 2016-05-19 DIAGNOSIS — E890 Postprocedural hypothyroidism: Secondary | ICD-10-CM | POA: Diagnosis not present

## 2016-05-19 DIAGNOSIS — I10 Essential (primary) hypertension: Secondary | ICD-10-CM | POA: Diagnosis not present

## 2016-05-19 DIAGNOSIS — Z96641 Presence of right artificial hip joint: Secondary | ICD-10-CM | POA: Diagnosis not present

## 2016-05-19 DIAGNOSIS — Z96652 Presence of left artificial knee joint: Secondary | ICD-10-CM | POA: Diagnosis not present

## 2016-05-19 DIAGNOSIS — H8111 Benign paroxysmal vertigo, right ear: Secondary | ICD-10-CM | POA: Diagnosis not present

## 2016-05-19 DIAGNOSIS — Z471 Aftercare following joint replacement surgery: Secondary | ICD-10-CM | POA: Diagnosis not present

## 2016-05-19 DIAGNOSIS — Z79891 Long term (current) use of opiate analgesic: Secondary | ICD-10-CM | POA: Diagnosis not present

## 2016-05-19 DIAGNOSIS — Z87891 Personal history of nicotine dependence: Secondary | ICD-10-CM | POA: Diagnosis not present

## 2016-05-19 DIAGNOSIS — E663 Overweight: Secondary | ICD-10-CM | POA: Diagnosis not present

## 2016-05-19 DIAGNOSIS — Z6825 Body mass index (BMI) 25.0-25.9, adult: Secondary | ICD-10-CM | POA: Diagnosis not present

## 2016-05-19 DIAGNOSIS — Z7982 Long term (current) use of aspirin: Secondary | ICD-10-CM | POA: Diagnosis not present

## 2016-05-23 DIAGNOSIS — Z7982 Long term (current) use of aspirin: Secondary | ICD-10-CM | POA: Diagnosis not present

## 2016-05-23 DIAGNOSIS — E890 Postprocedural hypothyroidism: Secondary | ICD-10-CM | POA: Diagnosis not present

## 2016-05-23 DIAGNOSIS — H8111 Benign paroxysmal vertigo, right ear: Secondary | ICD-10-CM | POA: Diagnosis not present

## 2016-05-23 DIAGNOSIS — E663 Overweight: Secondary | ICD-10-CM | POA: Diagnosis not present

## 2016-05-23 DIAGNOSIS — Z96641 Presence of right artificial hip joint: Secondary | ICD-10-CM | POA: Diagnosis not present

## 2016-05-23 DIAGNOSIS — I10 Essential (primary) hypertension: Secondary | ICD-10-CM | POA: Diagnosis not present

## 2016-05-23 DIAGNOSIS — I493 Ventricular premature depolarization: Secondary | ICD-10-CM | POA: Diagnosis not present

## 2016-05-23 DIAGNOSIS — Z471 Aftercare following joint replacement surgery: Secondary | ICD-10-CM | POA: Diagnosis not present

## 2016-05-23 DIAGNOSIS — Z96652 Presence of left artificial knee joint: Secondary | ICD-10-CM | POA: Diagnosis not present

## 2016-05-23 DIAGNOSIS — Z79891 Long term (current) use of opiate analgesic: Secondary | ICD-10-CM | POA: Diagnosis not present

## 2016-05-23 DIAGNOSIS — Z6825 Body mass index (BMI) 25.0-25.9, adult: Secondary | ICD-10-CM | POA: Diagnosis not present

## 2016-05-23 DIAGNOSIS — Z87891 Personal history of nicotine dependence: Secondary | ICD-10-CM | POA: Diagnosis not present

## 2016-05-24 DIAGNOSIS — M7062 Trochanteric bursitis, left hip: Secondary | ICD-10-CM | POA: Diagnosis not present

## 2016-05-25 DIAGNOSIS — M25561 Pain in right knee: Secondary | ICD-10-CM | POA: Diagnosis not present

## 2016-05-25 DIAGNOSIS — M25551 Pain in right hip: Secondary | ICD-10-CM | POA: Diagnosis not present

## 2016-05-25 DIAGNOSIS — Z96642 Presence of left artificial hip joint: Secondary | ICD-10-CM | POA: Diagnosis not present

## 2016-05-25 DIAGNOSIS — R2689 Other abnormalities of gait and mobility: Secondary | ICD-10-CM | POA: Diagnosis not present

## 2016-05-27 DIAGNOSIS — Z96642 Presence of left artificial hip joint: Secondary | ICD-10-CM | POA: Diagnosis not present

## 2016-05-27 DIAGNOSIS — R2689 Other abnormalities of gait and mobility: Secondary | ICD-10-CM | POA: Diagnosis not present

## 2016-05-27 DIAGNOSIS — M25551 Pain in right hip: Secondary | ICD-10-CM | POA: Diagnosis not present

## 2016-05-27 DIAGNOSIS — M25561 Pain in right knee: Secondary | ICD-10-CM | POA: Diagnosis not present

## 2016-05-30 DIAGNOSIS — M25551 Pain in right hip: Secondary | ICD-10-CM | POA: Diagnosis not present

## 2016-05-30 DIAGNOSIS — M25561 Pain in right knee: Secondary | ICD-10-CM | POA: Diagnosis not present

## 2016-05-30 DIAGNOSIS — Z96642 Presence of left artificial hip joint: Secondary | ICD-10-CM | POA: Diagnosis not present

## 2016-05-30 DIAGNOSIS — R2689 Other abnormalities of gait and mobility: Secondary | ICD-10-CM | POA: Diagnosis not present

## 2016-05-31 DIAGNOSIS — R5383 Other fatigue: Secondary | ICD-10-CM | POA: Diagnosis not present

## 2016-06-01 DIAGNOSIS — Z96642 Presence of left artificial hip joint: Secondary | ICD-10-CM | POA: Diagnosis not present

## 2016-06-01 DIAGNOSIS — R2689 Other abnormalities of gait and mobility: Secondary | ICD-10-CM | POA: Diagnosis not present

## 2016-06-01 DIAGNOSIS — M25561 Pain in right knee: Secondary | ICD-10-CM | POA: Diagnosis not present

## 2016-06-01 DIAGNOSIS — M25551 Pain in right hip: Secondary | ICD-10-CM | POA: Diagnosis not present

## 2016-06-03 DIAGNOSIS — M25551 Pain in right hip: Secondary | ICD-10-CM | POA: Diagnosis not present

## 2016-06-03 DIAGNOSIS — Z96642 Presence of left artificial hip joint: Secondary | ICD-10-CM | POA: Diagnosis not present

## 2016-06-03 DIAGNOSIS — R2689 Other abnormalities of gait and mobility: Secondary | ICD-10-CM | POA: Diagnosis not present

## 2016-06-03 DIAGNOSIS — M25561 Pain in right knee: Secondary | ICD-10-CM | POA: Diagnosis not present

## 2016-06-03 NOTE — Progress Notes (Signed)
Pt is being scheduled for preop appt; please place surgical orders in epic. Thanks.  

## 2016-06-06 DIAGNOSIS — I4891 Unspecified atrial fibrillation: Secondary | ICD-10-CM | POA: Diagnosis not present

## 2016-06-08 DIAGNOSIS — Z96642 Presence of left artificial hip joint: Secondary | ICD-10-CM | POA: Diagnosis not present

## 2016-06-08 DIAGNOSIS — M25551 Pain in right hip: Secondary | ICD-10-CM | POA: Diagnosis not present

## 2016-06-08 DIAGNOSIS — R2689 Other abnormalities of gait and mobility: Secondary | ICD-10-CM | POA: Diagnosis not present

## 2016-06-08 DIAGNOSIS — M25561 Pain in right knee: Secondary | ICD-10-CM | POA: Diagnosis not present

## 2016-06-10 DIAGNOSIS — E668 Other obesity: Secondary | ICD-10-CM | POA: Diagnosis not present

## 2016-06-10 DIAGNOSIS — M1990 Unspecified osteoarthritis, unspecified site: Secondary | ICD-10-CM | POA: Diagnosis not present

## 2016-06-10 DIAGNOSIS — E876 Hypokalemia: Secondary | ICD-10-CM | POA: Diagnosis not present

## 2016-06-10 DIAGNOSIS — I10 Essential (primary) hypertension: Secondary | ICD-10-CM | POA: Diagnosis not present

## 2016-06-10 DIAGNOSIS — K5909 Other constipation: Secondary | ICD-10-CM | POA: Diagnosis not present

## 2016-06-10 DIAGNOSIS — E038 Other specified hypothyroidism: Secondary | ICD-10-CM | POA: Diagnosis not present

## 2016-06-10 DIAGNOSIS — Z6826 Body mass index (BMI) 26.0-26.9, adult: Secondary | ICD-10-CM | POA: Diagnosis not present

## 2016-06-10 DIAGNOSIS — M25561 Pain in right knee: Secondary | ICD-10-CM | POA: Diagnosis not present

## 2016-06-10 DIAGNOSIS — R2689 Other abnormalities of gait and mobility: Secondary | ICD-10-CM | POA: Diagnosis not present

## 2016-06-10 DIAGNOSIS — E784 Other hyperlipidemia: Secondary | ICD-10-CM | POA: Diagnosis not present

## 2016-06-10 DIAGNOSIS — Z96642 Presence of left artificial hip joint: Secondary | ICD-10-CM | POA: Diagnosis not present

## 2016-06-10 DIAGNOSIS — M25551 Pain in right hip: Secondary | ICD-10-CM | POA: Diagnosis not present

## 2016-06-10 DIAGNOSIS — I480 Paroxysmal atrial fibrillation: Secondary | ICD-10-CM | POA: Diagnosis not present

## 2016-06-21 DIAGNOSIS — I480 Paroxysmal atrial fibrillation: Secondary | ICD-10-CM | POA: Diagnosis not present

## 2016-06-21 DIAGNOSIS — R002 Palpitations: Secondary | ICD-10-CM | POA: Diagnosis not present

## 2016-06-23 ENCOUNTER — Encounter (HOSPITAL_COMMUNITY): Payer: PPO

## 2016-07-04 DIAGNOSIS — I480 Paroxysmal atrial fibrillation: Secondary | ICD-10-CM | POA: Diagnosis not present

## 2016-07-04 DIAGNOSIS — E890 Postprocedural hypothyroidism: Secondary | ICD-10-CM | POA: Diagnosis not present

## 2016-07-04 DIAGNOSIS — R42 Dizziness and giddiness: Secondary | ICD-10-CM | POA: Diagnosis not present

## 2016-07-22 DIAGNOSIS — M19022 Primary osteoarthritis, left elbow: Secondary | ICD-10-CM | POA: Diagnosis not present

## 2016-07-22 NOTE — H&P (Signed)
TOTAL KNEE ADMISSION H&P  Patient is being admitted for right total knee arthroplasty.  Subjective:  Chief Complaint:    Right knee primary OA / pain  HPI: Heidi Adams, 80 y.o. female, has a history of pain and functional disability in the right knee due to arthritis and has failed non-surgical conservative treatments for greater than 12 weeks to include NSAID's and/or analgesics, use of assistive devices and activity modification.  Onset of symptoms was abrupt, starting in March 2018  with gradually worsening course since that time. The patient noted no past surgery on the right knee(s).  Patient currently rates pain in the right knee(s) at 10 out of 10 with activity. Patient has night pain, worsening of pain with activity and weight bearing, pain that interferes with activities of daily living, pain with passive range of motion, crepitus and joint swelling.  Patient has evidence of periarticular osteophytes and joint space narrowing by imaging studies.  There is no active infection.  Risks, benefits and expectations were discussed with the patient.  Risks including but not limited to the risk of anesthesia, blood clots, nerve damage, blood vessel damage, failure of the prosthesis, infection and up to and including death.  Patient understand the risks, benefits and expectations and wishes to proceed with surgery.   PCP: Precious Reel, MD  D/C Plans:       Home   Post-op Meds:       No Rx given   Tranexamic Acid:      To be given - IV   Decadron:      Is to be given  FYI:     ASA  Norco    Patient Active Problem List   Diagnosis Date Noted  . Overweight (BMI 25.0-29.9) 05/11/2016  . S/P right THA, AA 05/10/2016  . Right hip pain 03/16/2016  . Pes planus 03/16/2016  . Abnormality of gait 03/16/2016  . PAC (premature atrial contraction) 02/24/2016  . PVC (premature ventricular contraction) 02/24/2016  . Palpitations 01/27/2016  . Benign paroxysmal positional vertigo of right ear  12/15/2015  . Pharyngoesophageal dysphagia 12/15/2015  . Hypothyroidism 11/26/2015  . Essential hypertension 11/26/2015  . S/P left TKA 09/28/2015  . Lichen sclerosus [id number]   Past Medical History:  Diagnosis Date  . Arthritis   . GERD (gastroesophageal reflux disease)   . History of skin cancer   . Hypertension   . PVC (premature ventricular contraction)   . Thyroiditis 1973    Past Surgical History:  Procedure Laterality Date  . ABDOMINAL HYSTERECTOMY  89   TAH BSO  . APPENDECTOMY    . ELBOW SURGERY    . EYE SURGERY     "on my eyelids" years ago  . KNEE ARTHROSCOPY     left  . THYROIDECTOMY  1973  . TONSILLECTOMY    . TOTAL HIP ARTHROPLASTY Right 05/10/2016   Procedure: TOTAL HIP ARTHROPLASTY ANTERIOR APPROACH;  Surgeon: Paralee Cancel, MD;  Location: [facility];  Service: Orthopedics;  Laterality: Right;  . TOTAL KNEE ARTHROPLASTY Left 09/28/2015   Procedure: LEFT TOTAL KNEE ARTHROPLASTY;  Surgeon: Paralee Cancel, MD;  Location: [facility];  Service: Orthopedics;  Laterality: Left;    No prescriptions prior to admission.   Allergies  Allergen Reactions  . Epinephrine     Tachcardyia, chest pains tremors  . Tenormin [Atenolol] Rash    Social History  Substance Use Topics  . Smoking status: Former Smoker    Quit date: 07/26/1987  . Smokeless tobacco: Never  Used  . Alcohol use No    Family History  Problem Relation Age of Onset  . Diabetes Father   . Hypertension Father      Review of Systems  Constitutional: Negative.   HENT: Negative.   Eyes: Negative.   Respiratory: Negative.   Cardiovascular: Negative.   Gastrointestinal: Positive for heartburn.  Genitourinary: Negative.   Musculoskeletal: Positive for joint pain.  Skin: Negative.   Neurological: Negative.   Endo/Heme/Allergies: Negative.   Psychiatric/Behavioral: Negative.     Objective:  Physical Exam  Constitutional: She is oriented to person, place, and time. She appears well-developed.  HENT:   Head: Normocephalic.  Eyes: Pupils are equal, round, and reactive to light.  Neck: Neck supple. No JVD present. No tracheal deviation present. No thyromegaly present.  Cardiovascular: Normal rate, regular rhythm, normal heart sounds and intact distal pulses.   Respiratory: Effort normal and breath sounds normal. No respiratory distress. She has no wheezes.  GI: Soft. There is no tenderness. There is no guarding.  Musculoskeletal:       Right knee: She exhibits decreased range of motion, swelling, abnormal alignment and bony tenderness. She exhibits no ecchymosis, no deformity, no laceration and no erythema. Tenderness found.  Lymphadenopathy:    She has no cervical adenopathy.  Neurological: She is alert and oriented to person, place, and time.  Skin: Skin is warm and dry.  Psychiatric: She has a normal mood and affect.      Labs:  Estimated body mass index is 25.23 kg/m as calculated from the following:   Height as of 05/10/16: 5\' 4"  (1.626 m).   Weight as of 05/10/16: 66.7 kg (147 lb).   Imaging Review Plain radiographs demonstrate severe degenerative joint disease of the right knee, especially in the lateral compartment.  The lateral compartment has severe joint space loss with spurring of both the tibia and fibula.  The overall alignment is valgus which has increased significantly from previous films.  Patient also having increase posterior slope of the tibia do to osteoarthritis advancement.  Assessment/Plan:  End stage arthritis, right knee   The patient history, physical examination, clinical judgment of the provider and imaging studies are consistent with end stage degenerative joint disease of the right knee(s) and total knee arthroplasty is deemed medically necessary. The treatment options including medical management, injection therapy arthroscopy and arthroplasty were discussed at length. The risks and benefits of total knee arthroplasty were presented and reviewed. The  risks due to aseptic loosening, infection, stiffness, patella tracking problems, thromboembolic complications and other imponderables were discussed. The patient acknowledged the explanation, agreed to proceed with the plan and consent was signed. Patient is being admitted for inpatient treatment for surgery, pain control, PT, OT, prophylactic antibiotics, VTE prophylaxis, progressive ambulation and ADL's and discharge planning. The patient is planning to be discharged home.      West Pugh Tajae Rybicki   PA-C  07/22/2016, 1:25 PM

## 2016-08-02 ENCOUNTER — Encounter (HOSPITAL_COMMUNITY): Payer: Self-pay

## 2016-08-02 ENCOUNTER — Encounter (HOSPITAL_COMMUNITY)
Admission: RE | Admit: 2016-08-02 | Discharge: 2016-08-02 | Disposition: A | Discharge status: Home / Self Care | Payer: PPO | Source: Ambulatory Visit | Attending: Orthopedic Surgery | Admitting: Orthopedic Surgery

## 2016-08-02 ENCOUNTER — Encounter (INDEPENDENT_AMBULATORY_CARE_PROVIDER_SITE_OTHER): Payer: Self-pay

## 2016-08-02 DIAGNOSIS — Z01818 Encounter for other preprocedural examination: Secondary | ICD-10-CM | POA: Diagnosis not present

## 2016-08-02 DIAGNOSIS — M1711 Unilateral primary osteoarthritis, right knee: Secondary | ICD-10-CM | POA: Insufficient documentation

## 2016-08-02 HISTORY — DX: Cardiac arrhythmia, unspecified: I49.9

## 2016-08-02 LAB — SURGICAL PCR SCREEN
MRSA, PCR: NEGATIVE
STAPHYLOCOCCUS AUREUS: NEGATIVE

## 2016-08-02 LAB — CBC
HEMATOCRIT: 31.4 % — AB (ref 36.0–46.0)
HEMOGLOBIN: 9.3 g/dL — AB (ref 12.0–15.0)
MCH: 24.7 pg — ABNORMAL LOW (ref 26.0–34.0)
MCHC: 29.6 g/dL — AB (ref 30.0–36.0)
MCV: 83.5 fL (ref 78.0–100.0)
Platelets: 327 10*3/uL (ref 150–400)
RBC: 3.76 MIL/uL — ABNORMAL LOW (ref 3.87–5.11)
RDW: 17.2 % — ABNORMAL HIGH (ref 11.5–15.5)
WBC: 9 10*3/uL (ref 4.0–10.5)

## 2016-08-02 LAB — BASIC METABOLIC PANEL
ANION GAP: 6 (ref 5–15)
BUN: 19 mg/dL (ref 6–20)
CHLORIDE: 106 mmol/L (ref 101–111)
CO2: 26 mmol/L (ref 22–32)
Calcium: 8.5 mg/dL — ABNORMAL LOW (ref 8.9–10.3)
Creatinine, Ser: 0.62 mg/dL (ref 0.44–1.00)
GFR calc Af Amer: >60 mL/min (ref >60)
GLUCOSE: 85 mg/dL (ref 65–99)
POTASSIUM: 3.6 mmol/L (ref 3.5–5.1)
Sodium: 138 mmol/L (ref 135–145)

## 2016-08-02 NOTE — Patient Instructions (Addendum)
Heidi Adams  08/02/2016   Your procedure is scheduled TD:[id number] 08/09/2016   Report to [facility]  Entrance take [facility]  elevators to 3rd floor to  [place] at 1120 AM.  Call this number if you have problems the morning of surgery [phone]   Remember: ONLY 1 PERSON MAY GO WITH YOU TO SHORT STAY TO GET  READY MORNING OF [place].   Do not eat food  :After Midnight. MAY HAVE CLEAR LIQUIDS FROM MIDNIGHT UP UNTIL 0820 AM THEN NOTHING UNTIL AFTER SURGERY!     CLEAR LIQUID DIET   Foods Allowed                                                                     Foods Excluded  Coffee and tea, regular and decaf                             liquids that you cannot  Plain Jell-O in any flavor                                             see through such as: Fruit ices (not with fruit pulp)                                     milk, soups, orange juice  Iced Popsicles                                    All solid food Carbonated beverages, regular and diet                                    Cranberry, grape and apple juices Sports drinks like Gatorade Lightly seasoned clear broth or consume(fat free) Sugar, honey syrup  Sample Menu Breakfast                                Lunch                                     Supper Cranberry juice                    Beef broth                            Chicken broth Jell-O                                     Grape juice  Apple juice Coffee or tea                        Jell-O                                      Popsicle                                                Coffee or tea                        Coffee or tea  _____________________________________________________________________     Take these medicines the morning of surgery with A SIP OF WATER:LEVOTHYROXINE                                 You may not have any metal on your body including hair pins and               piercings  Do not wear jewelry, make-up, lotions, powders or perfumes, deodorant             Do not wear nail polish.  Do not shave  48 hours prior to surgery.              Men may shave face and neck.   Do not bring valuables to the hospital. [place].  Contacts, dentures or bridgework may not be worn into surgery.  Leave suitcase in the car. After surgery it may be brought to your room.                  Please read over the following fact sheets you were given: _____________________________________________________________________             [facility] - Preparing for Surgery Before surgery, you can play an important role.  Because skin is not sterile, your skin needs to be as free of germs as possible.  You can reduce the number of germs on your skin by washing with CHG (chlorahexidine gluconate) soap before surgery.  CHG is an antiseptic cleaner which kills germs and bonds with the skin to continue killing germs even after washing. Please DO NOT use if you have an allergy to CHG or antibacterial soaps.  If your skin becomes reddened/irritated stop using the CHG and inform your nurse when you arrive at Short Stay. Do not shave (including legs and underarms) for at least 48 hours prior to the first CHG shower.  You may shave your face/neck. Please follow these instructions carefully:  1.  Shower with CHG Soap the night before surgery and the  morning of Surgery.  2.  If you choose to wash your hair, wash your hair first as usual with your  normal  shampoo.  3.  After you shampoo, rinse your hair and body thoroughly to remove the  shampoo.                           4.  Use CHG as you would any other  liquid soap.  You can apply chg directly  to the skin and wash                       Gently with a scrungie or clean washcloth.  5.  Apply the CHG Soap to your body ONLY FROM THE NECK DOWN.   Do not use on face/ open                            Wound or open sores. Avoid contact with eyes, ears mouth and genitals (private parts).                       Wash face,  Genitals (private parts) with your normal soap.             6.  Wash thoroughly, paying special attention to the area where your surgery  will be performed.  7.  Thoroughly rinse your body with warm water from the neck down.  8.  DO NOT shower/wash with your normal soap after using and rinsing off  the CHG Soap.                9.  Pat yourself dry with a clean towel.            10.  Wear clean pajamas.            11.  Place clean sheets on your bed the night of your first shower and do not  sleep with pets. Day of Surgery : Do not apply any lotions/deodorants the morning of surgery.  Please wear clean clothes to the hospital/surgery center.  FAILURE TO FOLLOW THESE INSTRUCTIONS MAY RESULT IN THE CANCELLATION OF YOUR SURGERY PATIENT SIGNATURE_________________________________  NURSE SIGNATURE__________________________________  ________________________________________________________________________   Heidi Adams  An incentive spirometer is a tool that can help keep your lungs clear and active. This tool measures how well you are filling your lungs with each breath. Taking long deep breaths may help reverse or decrease the chance of developing breathing (pulmonary) problems (especially infection) following:  A long period of time when you are unable to move or be active. BEFORE THE PROCEDURE   If the spirometer includes an indicator to show your best effort, your nurse or respiratory therapist will set it to a desired goal.  If possible, sit up straight or lean slightly forward. Try not to slouch.  Hold the incentive spirometer in an upright position. INSTRUCTIONS FOR USE  1. Sit on the edge of your bed if possible, or sit up as far as you can in bed or on a chair. 2. Hold the incentive spirometer in an upright position. 3. Breathe out normally. 4. Place  the mouthpiece in your mouth and seal your lips tightly around it. 5. Breathe in slowly and as deeply as possible, raising the piston or the ball toward the top of the column. 6. Hold your breath for 3-5 seconds or for as long as possible. Allow the piston or ball to fall to the bottom of the column. 7. Remove the mouthpiece from your mouth and breathe out normally. 8. Rest for a few seconds and repeat Steps 1 through 7 at least 10 times every 1-2 hours when you are awake. Take your time and take a few normal breaths between deep breaths. 9. The spirometer may include an indicator to show your best effort. Use the indicator as a  goal to work toward during each repetition. 10. After each set of 10 deep breaths, practice coughing to be sure your lungs are clear. If you have an incision (the cut made at the time of surgery), support your incision when coughing by placing a pillow or rolled up towels firmly against it. Once you are able to get out of bed, walk around indoors and cough well. You may stop using the incentive spirometer when instructed by your caregiver.  RISKS AND COMPLICATIONS  Take your time so you do not get dizzy or light-headed.  If you are in pain, you may need to take or ask for pain medication before doing incentive spirometry. It is harder to take a deep breath if you are having pain. AFTER USE  Rest and breathe slowly and easily.  It can be helpful to keep track of a log of your progress. Your caregiver can provide you with a simple table to help with this. If you are using the spirometer at home, follow these instructions: [place] IF:   You are having difficultly using the spirometer.  You have trouble using the spirometer as often as instructed.  Your pain medication is not giving enough relief while using the spirometer.  You develop fever of 100.5 F (38.1 C) or higher. SEEK IMMEDIATE MEDICAL CARE IF:   You cough up bloody sputum that had not been  present before.  You develop fever of 102 F (38.9 C) or greater.  You develop worsening pain at or near the incision site. MAKE SURE YOU:   Understand these instructions.  Will watch your condition.  Will get help right away if you are not doing well or get worse. Document Released: 11/21/2006 Document Revised: 10/03/2011 Document Reviewed: 01/22/2007 ExitCare Patient Information 2014 Exit[employer], [place].   ________________________________________________________________________  WHAT IS A BLOOD TRANSFUSION? Blood Transfusion Information  A transfusion is the replacement of blood or some of its parts. Blood is made up of multiple cells which provide different functions.  Red blood cells carry oxygen and are used for blood loss replacement.  White blood cells fight against infection.  Platelets control bleeding.  Plasma helps clot blood.  Other blood products are available for specialized needs, such as hemophilia or other clotting disorders. BEFORE THE TRANSFUSION  Who gives blood for transfusions?   Healthy volunteers who are fully evaluated to make sure their blood is safe. This is blood bank blood. Transfusion therapy is the safest it has ever been in the practice of medicine. Before blood is taken from a donor, a complete history is taken to make sure that person has no history of diseases nor engages in risky social behavior (examples are intravenous drug use or sexual activity with multiple partners). The donor's travel history is screened to minimize risk of transmitting infections, such as malaria. The donated blood is tested for signs of infectious diseases, such as HIV and hepatitis. The blood is then tested to be sure it is compatible with you in order to minimize the chance of a transfusion reaction. If you or a relative donates blood, this is often done in anticipation of surgery and is not appropriate for emergency situations. It takes many days to process the donated  blood. RISKS AND COMPLICATIONS Although transfusion therapy is very safe and saves many lives, the main dangers of transfusion include:   Getting an infectious disease.  Developing a transfusion reaction. This is an allergic reaction to something in the blood you were given. Every  precaution is taken to prevent this. The decision to have a blood transfusion has been considered carefully by your caregiver before blood is given. Blood is not given unless the benefits outweigh the risks. AFTER THE TRANSFUSION  Right after receiving a blood transfusion, you will usually feel much better and more energetic. This is especially true if your red blood cells have gotten low (anemic). The transfusion raises the level of the red blood cells which carry oxygen, and this usually causes an energy increase.  The nurse administering the transfusion will monitor you carefully for complications. HOME CARE INSTRUCTIONS  No special instructions are needed after a transfusion. You may find your energy is better. Speak with your caregiver about any limitations on activity for underlying diseases you may have. SEEK MEDICAL CARE IF:   Your condition is not improving after your transfusion.  You develop redness or irritation at the intravenous (IV) site. SEEK IMMEDIATE MEDICAL CARE IF:  Any of the following symptoms occur over the next 12 hours:  Shaking chills.  You have a temperature by mouth above 102 F (38.9 C), not controlled by medicine.  Chest, back, or muscle pain.  People around you feel you are not acting correctly or are confused.  Shortness of breath or difficulty breathing.  Dizziness and fainting.  You get a rash or develop hives.  You have a decrease in urine output.  Your urine turns a dark color or changes to pink, red, or brown. Any of the following symptoms occur over the next 10 days:  You have a temperature by mouth above 102 F (38.9 C), not controlled by  medicine.  Shortness of breath.  Weakness after normal activity.  The white part of the eye turns yellow (jaundice).  You have a decrease in the amount of urine or are urinating less often.  Your urine turns a dark color or changes to pink, red, or brown. Document Released: 07/08/2000 Document Revised: 10/03/2011 Document Reviewed: 02/25/2008 [facility] Patient Information 2014 [place] [employer], [place].  _______________________________________________________________________

## 2016-08-04 DIAGNOSIS — I10 Essential (primary) hypertension: Secondary | ICD-10-CM | POA: Diagnosis not present

## 2016-08-09 ENCOUNTER — Encounter (HOSPITAL_COMMUNITY)
Admission: RE | Disposition: A | Discharge status: Home / Self Care | Payer: Self-pay | Source: Ambulatory Visit | Attending: Orthopedic Surgery

## 2016-08-09 ENCOUNTER — Inpatient Hospital Stay (HOSPITAL_COMMUNITY): Payer: PPO | Admitting: Anesthesiology

## 2016-08-09 ENCOUNTER — Encounter (HOSPITAL_COMMUNITY): Payer: Self-pay | Admitting: *Deleted

## 2016-08-09 ENCOUNTER — Inpatient Hospital Stay (HOSPITAL_COMMUNITY)
Admission: RE | Admit: 2016-08-09 | Discharge: 2016-08-11 | DRG: 470 | Disposition: A | Discharge status: Home / Self Care | Payer: PPO | Source: Ambulatory Visit | Attending: Orthopedic Surgery | Admitting: Orthopedic Surgery

## 2016-08-09 DIAGNOSIS — D649 Anemia, unspecified: Secondary | ICD-10-CM | POA: Diagnosis not present

## 2016-08-09 DIAGNOSIS — Z90710 Acquired absence of both cervix and uterus: Secondary | ICD-10-CM | POA: Diagnosis not present

## 2016-08-09 DIAGNOSIS — E039 Hypothyroidism, unspecified: Secondary | ICD-10-CM | POA: Diagnosis present

## 2016-08-09 DIAGNOSIS — Z85828 Personal history of other malignant neoplasm of skin: Secondary | ICD-10-CM | POA: Diagnosis not present

## 2016-08-09 DIAGNOSIS — I10 Essential (primary) hypertension: Secondary | ICD-10-CM | POA: Diagnosis not present

## 2016-08-09 DIAGNOSIS — Z833 Family history of diabetes mellitus: Secondary | ICD-10-CM | POA: Diagnosis not present

## 2016-08-09 DIAGNOSIS — Z96641 Presence of right artificial hip joint: Secondary | ICD-10-CM | POA: Diagnosis not present

## 2016-08-09 DIAGNOSIS — Z888 Allergy status to other drugs, medicaments and biological substances status: Secondary | ICD-10-CM

## 2016-08-09 DIAGNOSIS — Z6825 Body mass index (BMI) 25.0-25.9, adult: Secondary | ICD-10-CM

## 2016-08-09 DIAGNOSIS — Z96652 Presence of left artificial knee joint: Secondary | ICD-10-CM | POA: Diagnosis not present

## 2016-08-09 DIAGNOSIS — M1711 Unilateral primary osteoarthritis, right knee: Secondary | ICD-10-CM | POA: Diagnosis not present

## 2016-08-09 DIAGNOSIS — G8918 Other acute postprocedural pain: Secondary | ICD-10-CM | POA: Diagnosis not present

## 2016-08-09 DIAGNOSIS — Z96651 Presence of right artificial knee joint: Secondary | ICD-10-CM

## 2016-08-09 DIAGNOSIS — K219 Gastro-esophageal reflux disease without esophagitis: Secondary | ICD-10-CM | POA: Diagnosis not present

## 2016-08-09 DIAGNOSIS — Z87891 Personal history of nicotine dependence: Secondary | ICD-10-CM | POA: Diagnosis not present

## 2016-08-09 DIAGNOSIS — Z8249 Family history of ischemic heart disease and other diseases of the circulatory system: Secondary | ICD-10-CM

## 2016-08-09 DIAGNOSIS — E663 Overweight: Secondary | ICD-10-CM | POA: Diagnosis not present

## 2016-08-09 HISTORY — PX: TOTAL KNEE ARTHROPLASTY: SHX125

## 2016-08-09 LAB — TYPE AND SCREEN
ABO/RH(D): AB NEG
Antibody Screen: NEGATIVE

## 2016-08-09 SURGERY — ARTHROPLASTY, KNEE, TOTAL
Anesthesia: Spinal | Site: Knee | Laterality: Right

## 2016-08-09 MED ORDER — TRIAMTERENE-HCTZ 37.5-25 MG PO TABS
1.0000 | ORAL_TABLET | Freq: Every day | ORAL | Status: DC
  Administered 2016-08-10 – 2016-08-11 (×2): 1 via ORAL
  Filled 2016-08-09 (×2): qty 1

## 2016-08-09 MED ORDER — FENTANYL CITRATE (PF) 100 MCG/2ML IJ SOLN
INTRAMUSCULAR | Status: AC
  Filled 2016-08-09: qty 2

## 2016-08-09 MED ORDER — CEFAZOLIN SODIUM-DEXTROSE 2-4 GM/100ML-% IV SOLN
2.0000 g | INTRAVENOUS | Status: AC
  Administered 2016-08-09: 2 g via INTRAVENOUS
  Filled 2016-08-09: qty 100

## 2016-08-09 MED ORDER — CHLORHEXIDINE GLUCONATE 4 % EX LIQD: 60.0000 mL | Freq: Once | CUTANEOUS | Status: DC

## 2016-08-09 MED ORDER — 0.9 % SODIUM CHLORIDE (POUR BTL) OPTIME
TOPICAL | Status: DC | PRN
  Administered 2016-08-09: 1000 mL

## 2016-08-09 MED ORDER — MENTHOL 3 MG MT LOZG: 1.0000 | LOZENGE | OROMUCOSAL | Status: DC | PRN

## 2016-08-09 MED ORDER — ROPIVACAINE HCL 7.5 MG/ML IJ SOLN
INTRAMUSCULAR | Status: DC | PRN
  Administered 2016-08-09: 20 mL via PERINEURAL

## 2016-08-09 MED ORDER — MIDAZOLAM HCL 5 MG/5ML IJ SOLN
1.0000 mg | INTRAMUSCULAR | Status: DC | PRN
  Administered 2016-08-09: 1 mg via INTRAVENOUS

## 2016-08-09 MED ORDER — ONDANSETRON HCL 4 MG/2ML IJ SOLN
INTRAMUSCULAR | Status: AC
  Filled 2016-08-09: qty 2

## 2016-08-09 MED ORDER — MAGNESIUM CITRATE PO SOLN: 1.0000 | Freq: Once | ORAL | Status: DC | PRN

## 2016-08-09 MED ORDER — FERROUS SULFATE 325 (65 FE) MG PO TABS
325.0000 mg | ORAL_TABLET | Freq: Three times a day (TID) | ORAL | Status: DC
  Administered 2016-08-10 – 2016-08-11 (×5): 325 mg via ORAL
  Filled 2016-08-09 (×5): qty 1

## 2016-08-09 MED ORDER — PANTOPRAZOLE SODIUM 40 MG PO TBEC: 40.0000 mg | DELAYED_RELEASE_TABLET | Freq: Every day | ORAL | Status: DC | PRN

## 2016-08-09 MED ORDER — BISACODYL 10 MG RE SUPP: 10.0000 mg | Freq: Every day | RECTAL | Status: DC | PRN

## 2016-08-09 MED ORDER — ROPIVACAINE HCL 7.5 MG/ML IJ SOLN
INTRAMUSCULAR | Status: AC
  Filled 2016-08-09: qty 20

## 2016-08-09 MED ORDER — DIPHENHYDRAMINE HCL 25 MG PO CAPS: 25.0000 mg | ORAL_CAPSULE | Freq: Four times a day (QID) | ORAL | Status: DC | PRN

## 2016-08-09 MED ORDER — DEXAMETHASONE SODIUM PHOSPHATE 10 MG/ML IJ SOLN
10.0000 mg | Freq: Once | INTRAMUSCULAR | Status: AC
  Administered 2016-08-09: 10 mg via INTRAVENOUS

## 2016-08-09 MED ORDER — PROPOFOL 10 MG/ML IV BOLUS
INTRAVENOUS | Status: AC
  Filled 2016-08-09: qty 40

## 2016-08-09 MED ORDER — DEXAMETHASONE SODIUM PHOSPHATE 10 MG/ML IJ SOLN
INTRAMUSCULAR | Status: AC
  Filled 2016-08-09: qty 1

## 2016-08-09 MED ORDER — METOCLOPRAMIDE HCL 5 MG PO TABS: 5.0000 mg | ORAL_TABLET | Freq: Three times a day (TID) | ORAL | Status: DC | PRN

## 2016-08-09 MED ORDER — CEFAZOLIN SODIUM-DEXTROSE 2-4 GM/100ML-% IV SOLN
INTRAVENOUS | Status: AC
  Filled 2016-08-09: qty 100

## 2016-08-09 MED ORDER — ONDANSETRON HCL 4 MG PO TABS: 4.0000 mg | ORAL_TABLET | Freq: Four times a day (QID) | ORAL | Status: DC | PRN

## 2016-08-09 MED ORDER — MIDAZOLAM HCL 2 MG/2ML IJ SOLN
INTRAMUSCULAR | Status: AC
  Filled 2016-08-09: qty 2

## 2016-08-09 MED ORDER — DOCUSATE SODIUM 100 MG PO CAPS
100.0000 mg | ORAL_CAPSULE | Freq: Two times a day (BID) | ORAL | Status: DC
  Administered 2016-08-09 – 2016-08-11 (×4): 100 mg via ORAL
  Filled 2016-08-09 (×4): qty 1

## 2016-08-09 MED ORDER — CELECOXIB 200 MG PO CAPS
200.0000 mg | ORAL_CAPSULE | Freq: Two times a day (BID) | ORAL | Status: DC
  Administered 2016-08-09 – 2016-08-11 (×4): 200 mg via ORAL
  Filled 2016-08-09 (×4): qty 1

## 2016-08-09 MED ORDER — KETOROLAC TROMETHAMINE 30 MG/ML IJ SOLN
INTRAMUSCULAR | Status: DC | PRN
  Administered 2016-08-09: 30 mg

## 2016-08-09 MED ORDER — ONDANSETRON HCL 4 MG/2ML IJ SOLN
4.0000 mg | Freq: Four times a day (QID) | INTRAMUSCULAR | Status: DC | PRN
  Filled 2016-08-09: qty 2

## 2016-08-09 MED ORDER — METHOCARBAMOL 500 MG PO TABS
500.0000 mg | ORAL_TABLET | Freq: Four times a day (QID) | ORAL | Status: DC | PRN
  Administered 2016-08-11: 500 mg via ORAL
  Filled 2016-08-09: qty 1

## 2016-08-09 MED ORDER — LEVOTHYROXINE SODIUM 125 MCG PO TABS
125.0000 ug | ORAL_TABLET | Freq: Every day | ORAL | Status: DC
  Administered 2016-08-10 – 2016-08-11 (×2): 125 ug via ORAL
  Filled 2016-08-09 (×2): qty 1

## 2016-08-09 MED ORDER — HYDROCODONE-ACETAMINOPHEN 7.5-325 MG PO TABS: 1.0000 | ORAL_TABLET | ORAL | Status: DC

## 2016-08-09 MED ORDER — HYDROMORPHONE HCL 1 MG/ML IJ SOLN: 0.5000 mg | INTRAMUSCULAR | Status: DC | PRN

## 2016-08-09 MED ORDER — CEFAZOLIN SODIUM-DEXTROSE 2-4 GM/100ML-% IV SOLN
2.0000 g | Freq: Four times a day (QID) | INTRAVENOUS | Status: AC
  Administered 2016-08-09 – 2016-08-10 (×2): 2 g via INTRAVENOUS
  Filled 2016-08-09 (×2): qty 100

## 2016-08-09 MED ORDER — SODIUM CHLORIDE 0.9 % IJ SOLN
INTRAMUSCULAR | Status: AC
  Filled 2016-08-09: qty 50

## 2016-08-09 MED ORDER — LACTATED RINGERS IV SOLN
INTRAVENOUS | Status: DC
  Administered 2016-08-09 (×2): via INTRAVENOUS

## 2016-08-09 MED ORDER — MIDAZOLAM HCL 5 MG/5ML IJ SOLN
INTRAMUSCULAR | Status: DC | PRN
  Administered 2016-08-09 (×2): 1 mg via INTRAVENOUS

## 2016-08-09 MED ORDER — PHENOL 1.4 % MT LIQD: 1.0000 | OROMUCOSAL | Status: DC | PRN

## 2016-08-09 MED ORDER — ONDANSETRON HCL 4 MG/2ML IJ SOLN
INTRAMUSCULAR | Status: DC | PRN
  Administered 2016-08-09: 4 mg via INTRAVENOUS

## 2016-08-09 MED ORDER — ALUM & MAG HYDROXIDE-SIMETH 200-200-20 MG/5ML PO SUSP: 30.0000 mL | ORAL | Status: DC | PRN

## 2016-08-09 MED ORDER — PROPOFOL 500 MG/50ML IV EMUL
INTRAVENOUS | Status: DC | PRN
  Administered 2016-08-09: 75 ug/kg/min via INTRAVENOUS

## 2016-08-09 MED ORDER — POTASSIUM CHLORIDE 2 MEQ/ML IV SOLN
INTRAVENOUS | Status: DC
  Administered 2016-08-09: 21:00:00 via INTRAVENOUS
  Filled 2016-08-09 (×6): qty 1000

## 2016-08-09 MED ORDER — TRANEXAMIC ACID 1000 MG/10ML IV SOLN
1000.0000 mg | INTRAVENOUS | Status: AC
  Administered 2016-08-09: 1000 mg via INTRAVENOUS
  Filled 2016-08-09: qty 10

## 2016-08-09 MED ORDER — POLYETHYLENE GLYCOL 3350 17 G PO PACK
17.0000 g | PACK | Freq: Two times a day (BID) | ORAL | Status: DC
  Administered 2016-08-09 – 2016-08-10 (×3): 17 g via ORAL
  Filled 2016-08-09 (×3): qty 1

## 2016-08-09 MED ORDER — FENTANYL CITRATE (PF) 100 MCG/2ML IJ SOLN: 25.0000 ug | INTRAMUSCULAR | Status: DC | PRN

## 2016-08-09 MED ORDER — OXYCODONE HCL 5 MG PO TABS
5.0000 mg | ORAL_TABLET | ORAL | Status: DC | PRN
  Administered 2016-08-09: 5 mg via ORAL
  Administered 2016-08-10: 15 mg via ORAL
  Administered 2016-08-10 (×3): 10 mg via ORAL
  Administered 2016-08-11: 5 mg via ORAL
  Administered 2016-08-11 (×3): 10 mg via ORAL
  Filled 2016-08-09 (×2): qty 2
  Filled 2016-08-09: qty 3
  Filled 2016-08-09: qty 2
  Filled 2016-08-09: qty 3
  Filled 2016-08-09: qty 2
  Filled 2016-08-09 (×2): qty 1
  Filled 2016-08-09: qty 2

## 2016-08-09 MED ORDER — AMIODARONE HCL 200 MG PO TABS
200.0000 mg | ORAL_TABLET | Freq: Every day | ORAL | Status: DC
  Filled 2016-08-09 (×2): qty 1

## 2016-08-09 MED ORDER — DEXAMETHASONE SODIUM PHOSPHATE 10 MG/ML IJ SOLN
10.0000 mg | Freq: Once | INTRAMUSCULAR | Status: AC
  Administered 2016-08-10: 10 mg via INTRAVENOUS
  Filled 2016-08-09: qty 1

## 2016-08-09 MED ORDER — BUPIVACAINE HCL (PF) 0.25 % IJ SOLN
INTRAMUSCULAR | Status: DC | PRN
  Administered 2016-08-09: 30 mL

## 2016-08-09 MED ORDER — KETOROLAC TROMETHAMINE 30 MG/ML IJ SOLN
INTRAMUSCULAR | Status: AC
  Filled 2016-08-09: qty 1

## 2016-08-09 MED ORDER — ASPIRIN 81 MG PO CHEW
81.0000 mg | CHEWABLE_TABLET | Freq: Two times a day (BID) | ORAL | Status: DC
  Administered 2016-08-09 – 2016-08-11 (×4): 81 mg via ORAL
  Filled 2016-08-09 (×4): qty 1

## 2016-08-09 MED ORDER — ACETAMINOPHEN 500 MG PO TABS
1000.0000 mg | ORAL_TABLET | Freq: Four times a day (QID) | ORAL | Status: DC
  Administered 2016-08-09 – 2016-08-11 (×8): 1000 mg via ORAL
  Filled 2016-08-09 (×8): qty 2

## 2016-08-09 MED ORDER — METHOCARBAMOL 1000 MG/10ML IJ SOLN
500.0000 mg | Freq: Four times a day (QID) | INTRAVENOUS | Status: DC | PRN
  Administered 2016-08-09: 500 mg via INTRAVENOUS
  Filled 2016-08-09: qty 5
  Filled 2016-08-09: qty 550

## 2016-08-09 MED ORDER — SODIUM CHLORIDE 0.9 % IR SOLN
Status: DC | PRN
Start: 2016-08-09 — End: 2016-08-09
  Administered 2016-08-09: 1000 mL

## 2016-08-09 MED ORDER — SODIUM CHLORIDE 0.9 % IJ SOLN
INTRAMUSCULAR | Status: DC | PRN
  Administered 2016-08-09: 29 mL

## 2016-08-09 MED ORDER — STERILE WATER FOR IRRIGATION IR SOLN
Status: DC | PRN
  Administered 2016-08-09: 2000 mL

## 2016-08-09 MED ORDER — BUPIVACAINE HCL (PF) 0.25 % IJ SOLN
INTRAMUSCULAR | Status: AC
  Filled 2016-08-09: qty 30

## 2016-08-09 MED ORDER — FENTANYL CITRATE (PF) 100 MCG/2ML IJ SOLN
50.0000 ug | INTRAMUSCULAR | Status: DC | PRN
  Administered 2016-08-09: 50 ug via INTRAVENOUS

## 2016-08-09 MED ORDER — METOCLOPRAMIDE HCL 5 MG/ML IJ SOLN: 5.0000 mg | Freq: Three times a day (TID) | INTRAMUSCULAR | Status: DC | PRN

## 2016-08-09 SURGICAL SUPPLY — 46 items
BAG SPEC THK2 15X12 ZIP CLS (MISCELLANEOUS) ×1
BAG ZIPLOCK 12X15 (MISCELLANEOUS) ×2 IMPLANT
BANDAGE ACE 6X5 VEL STRL LF (GAUZE/BANDAGES/DRESSINGS) ×2 IMPLANT
BLADE SAW SGTL 13.0X1.19X90.0M (BLADE) ×2 IMPLANT
BOWL SMART MIX CTS (DISPOSABLE) ×2 IMPLANT
CAPT KNEE TOTAL 3 ATTUNE ×2 IMPLANT
CEMENT HV SMART SET (Cement) ×2 IMPLANT
CLOTH BEACON ORANGE TIMEOUT ST (SAFETY) ×2 IMPLANT
CUFF TOURN SGL QUICK 34 (TOURNIQUET CUFF) ×1
CUFF TRNQT CYL 34X4X40X1 (TOURNIQUET CUFF) ×1 IMPLANT
DECANTER SPIKE VIAL GLASS SM (MISCELLANEOUS) ×2 IMPLANT
DERMABOND ADVANCED (GAUZE/BANDAGES/DRESSINGS) ×1
DERMABOND ADVANCED .7 DNX12 (GAUZE/BANDAGES/DRESSINGS) ×1 IMPLANT
DRAPE U-SHAPE 47X51 STRL (DRAPES) ×2 IMPLANT
DRESSING AQUACEL AG SP 3.5X10 (GAUZE/BANDAGES/DRESSINGS) ×1 IMPLANT
DRSG AQUACEL AG ADV 3.5X10 (GAUZE/BANDAGES/DRESSINGS) ×2 IMPLANT
DRSG AQUACEL AG SP 3.5X10 (GAUZE/BANDAGES/DRESSINGS) ×2
DURAPREP 26ML APPLICATOR (WOUND CARE) ×4 IMPLANT
ELECT REM PT RETURN 9FT ADLT (ELECTROSURGICAL) ×2
ELECTRODE REM PT RTRN 9FT ADLT (ELECTROSURGICAL) ×1 IMPLANT
GLOVE BIOGEL M 7.0 STRL (GLOVE) ×4 IMPLANT
GLOVE BIOGEL PI IND STRL 7.5 (GLOVE) ×6 IMPLANT
GLOVE BIOGEL PI IND STRL 8.5 (GLOVE) ×1 IMPLANT
GLOVE BIOGEL PI INDICATOR 7.5 (GLOVE) ×6
GLOVE BIOGEL PI INDICATOR 8.5 (GLOVE) ×1
GLOVE ECLIPSE 8.0 STRL XLNG CF (GLOVE) ×4 IMPLANT
GLOVE ORTHO TXT STRL SZ7.5 (GLOVE) ×2 IMPLANT
GLOVE SURG SS PI 7.5 STRL IVOR (GLOVE) ×2 IMPLANT
GOWN SPEC L3 XXLG W/TWL (GOWN DISPOSABLE) ×2 IMPLANT
GOWN STRL REUS W/TWL LRG LVL3 (GOWN DISPOSABLE) ×2 IMPLANT
GOWN STRL REUS W/TWL XL LVL3 (GOWN DISPOSABLE) ×4 IMPLANT
HANDPIECE INTERPULSE COAX TIP (DISPOSABLE) ×1
MANIFOLD NEPTUNE II (INSTRUMENTS) ×2 IMPLANT
PACK TOTAL KNEE CUSTOM (KITS) ×2 IMPLANT
POSITIONER SURGICAL ARM (MISCELLANEOUS) ×2 IMPLANT
SET HNDPC FAN SPRY TIP SCT (DISPOSABLE) ×1 IMPLANT
SET PAD KNEE POSITIONER (MISCELLANEOUS) ×2 IMPLANT
SUT MNCRL AB 4-0 PS2 18 (SUTURE) ×2 IMPLANT
SUT VIC AB 1 CT1 36 (SUTURE) ×2 IMPLANT
SUT VIC AB 2-0 CT1 27 (SUTURE) ×6
SUT VIC AB 2-0 CT1 TAPERPNT 27 (SUTURE) ×3 IMPLANT
SUT VLOC 180 0 24IN GS25 (SUTURE) ×2 IMPLANT
SYR 50ML LL SCALE MARK (SYRINGE) ×2 IMPLANT
TRAY FOLEY CATH SILVER 14FR (SET/KITS/TRAYS/PACK) ×2 IMPLANT
WRAP KNEE MAXI GEL POST OP (GAUZE/BANDAGES/DRESSINGS) ×2 IMPLANT
YANKAUER SUCT BULB TIP 10FT TU (MISCELLANEOUS) ×2 IMPLANT

## 2016-08-09 NOTE — Anesthesia Procedure Notes (Signed)
Spinal  Patient location during procedure: OR Preanesthetic Checklist Completed: patient identified, site marked, surgical consent, pre-op evaluation, timeout performed, IV checked, risks and benefits discussed and monitors and equipment checked Spinal Block Patient position: sitting Prep: DuraPrep Patient monitoring: heart rate, cardiac monitor, continuous pulse ox and blood pressure Approach: midline Location: L3-4 Injection technique: single-shot Needle Needle type: Sprotte  Needle gauge: 24 G Needle length: 9 cm Assessment Sensory level: T4 Additional Notes Spinal Dosage in OR  Bupivicaine ml       1.6 RLD x 3 min

## 2016-08-09 NOTE — Interval H&P Note (Signed)
History and Physical Interval Note:  08/09/2016 12:39 PM  Heidi Adams  has presented today for surgery, with the diagnosis of right knee osteoarthritis  The various methods of treatment have been discussed with the patient and family. After consideration of risks, benefits and other options for treatment, the patient has consented to  Procedure(s): RIGHT TOTAL KNEE ARTHROPLASTY (Right) as a surgical intervention .  The patient's history has been reviewed, patient examined, no change in status, stable for surgery.  I have reviewed the patient's chart and labs.  Questions were answered to the patient's satisfaction.     [name]

## 2016-08-09 NOTE — Transfer of Care (Signed)
Immediate Anesthesia Transfer of Care Note  Patient: Heidi Adams  Procedure(s) Performed: Procedure(s) with comments: RIGHT TOTAL KNEE ARTHROPLASTY (Right) - Adductor Block  Patient Location: PACU  Anesthesia Type:MAC and Spinal  Level of Consciousness: awake, alert  and oriented  Airway & Oxygen Therapy: Patient Spontanous Breathing and Patient connected to face mask oxygen  Post-op Assessment: Report given to RN and Post -op Vital signs reviewed and stable  Post vital signs: Reviewed and stable  Last Vitals:  Vitals:   08/09/16 1348 08/09/16 1350  BP:    Pulse: 79 78  Resp: 13 13  Temp:      Last Pain:  Vitals:   08/09/16 1130  TempSrc: Oral      Patients Stated Pain Goal: 3 (17/79/39 0300)  Complications: No apparent anesthesia complications

## 2016-08-09 NOTE — Anesthesia Procedure Notes (Signed)
Anesthesia Regional Block:  Adductor canal block  Pre-Anesthetic Checklist: ,, timeout performed, Correct Patient, Correct Site, Correct Laterality, Correct Procedure, Correct Position, site marked, Risks and benefits discussed, pre-op evaluation,  At surgeon's request and post-op pain management  Laterality: Right  Prep: chloraprep       Needles:   Needle Type: Echogenic Needle     Needle Length: 9cm 9 cm Needle Gauge: 21 and 21 G    Additional Needles:  Procedures: ultrasound guided (picture in chart) Adductor canal block Narrative:  Start time: 08/09/2016 1:12 PM End time: 08/09/2016 1:16 PM Injection made incrementally with aspirations every 5 mL. Anesthesiologist: Lyndle Herrlich  Additional Notes: 20cc .75% Naropin

## 2016-08-09 NOTE — Progress Notes (Signed)
AssistedDr. Kyle Jackson with right, ultrasound guided, adductor canal block. Side rails up, monitors on throughout procedure. See vital signs in flow sheet. Tolerated Procedure well.  

## 2016-08-09 NOTE — Op Note (Signed)
NAME:  Heidi Adams                      MEDICAL RECORD NO.:  [id number]                             FACILITY:  [facility]      PHYSICIAN:  Pietro Cassis. Alvan Dame, M.D.  DATE OF BIRTH:  12/05/1935      DATE OF PROCEDURE:  08/09/2016                                     OPERATIVE REPORT         PREOPERATIVE DIAGNOSIS:  Right knee osteoarthritis.      POSTOPERATIVE DIAGNOSIS:  Right knee osteoarthritis.      FINDINGS:  The patient was noted to have complete loss of cartilage and   bone-on-bone arthritis with associated osteophytes in the lateral and patellofemoral compartments of   the knee with a significant synovitis and associated effusion.      PROCEDURE:  Right total knee replacement.      COMPONENTS USED:  DePuy Attune rotating platform posterior stabilized knee   system, a size 4N femur, 4 tibia, size 6 PS AOX insert, and 35 anatomic patellar   button.      SURGEON:  Pietro Cassis. Alvan Dame, M.D.      ASSISTANT:  Nehemiah Massed, PA-C.      ANESTHESIA:  Spinal.      SPECIMENS:  None.      COMPLICATION:  None.      DRAINS:  None.  EBL: <150cc      TOURNIQUET TIME:   Total Tourniquet Time Documented: Thigh (Right) - 22 minutes Total: Thigh (Right) - 22 minutes  .      The patient was stable to the recovery room.      INDICATION FOR PROCEDURE:  Heidi Adams is a 81 y.o. female patient of   mine.  The patient had been seen, evaluated, and treated conservatively in the   office with medication, activity modification, and injections.  The patient had   radiographic changes of bone-on-bone arthritis with endplate sclerosis and osteophytes noted.      The patient failed conservative measures including medication, injections, and activity modification, and at this point was ready for more definitive measures.   Based on the radiographic changes and failed conservative measures, the patient   decided to proceed with total knee replacement.  Risks of infection,   DVT, component failure,  need for revision surgery, postop course, and   expectations were all   discussed and reviewed.  Consent was obtained for benefit of pain   relief.      PROCEDURE IN DETAIL:  The patient was brought to the operative theater.   Once adequate anesthesia, preoperative antibiotics, 2 gm of Ancef, 1 gm of Tranexamic Acid, and 10 mg of Decadron administered, the patient was positioned supine with the right thigh tourniquet placed.  The  right lower extremity was prepped and draped in sterile fashion.  A time-   out was performed identifying the patient, planned procedure, and   extremity.      The right lower extremity was placed in the [facility] leg holder.  The leg was   exsanguinated, tourniquet elevated to 250 mmHg.  A midline incision was   made followed  by median parapatellar arthrotomy.  Following initial   exposure, attention was first directed to the patella.  Precut   measurement was noted to be 22 mm.  I resected down to 13 mm and used a   35 anatomic patellar button to restore patellar height as well as cover the cut   surface.      The lug holes were drilled and a metal shim was placed to protect the   patella from retractors and saw blades.      At this point, attention was now directed to the femur.  The femoral   canal was opened with a drill, irrigated to try to prevent fat emboli.  An   intramedullary rod was passed at 3 degrees valgus, 9 mm of bone was   resected off the distal femur.  Following this resection, the tibia was   subluxated anteriorly.  Using the extramedullary guide, 2 mm of bone was resected off   the proximal medial tibia.  We confirmed the gap would be   stable medially and laterally with a size 5 spacer block as well as confirmed   the cut was perpendicular in the coronal plane, checking with an alignment rod.      Once this was done, I sized the femur to be a size 4 in the anterior-   posterior dimension, chose a narrow component based on medial and    lateral dimension.  The size 4 rotation block was then pinned in   position anterior referenced using the C-clamp to set rotation.  The   anterior, posterior, and  chamfer cuts were made without difficulty nor   notching making certain that I was along the anterior cortex to help   with flexion gap stability.      The final box cut was made off the lateral aspect of distal femur.      At this point, the tibia was sized to be a size 4, the size 4 tray was   then pinned in position through the medial third of the tubercle,   drilled, and keel punched.  Trial reduction was now carried with a 4 femur,  4 tibia, a size 6 PS insert, and the 35 anatomic patella botton.  The knee was brought to   extension, full extension with good flexion stability with the patella   tracking through the trochlea without application of pressure.  Given   all these findings the femoral lug holes were drilled and then the trial components removed.  Final components were   opened and cement was mixed.  The knee was irrigated with normal saline   solution and pulse lavage.  The synovial lining was   then injected with 30 cc of 0.25% Marcaine without epinephrine and 1 cc of Toradol plus 30 cc of NS for a total of 61 cc.      The knee was irrigated.  Final implants were then cemented onto clean and   dried cut surfaces of bone with the knee brought to extension with a size 6 PS trial insert.      Once the cement had fully cured, the excess cement was removed   throughout the knee.  I confirmed I was satisfied with the range of   motion and stability, and the final size 6 PS AOX insert was chosen.  It was   placed into the knee.      The tourniquet had been let down at 22 minutes.  No significant  hemostasis required.  The   extensor mechanism was then reapproximated using #1 Vicryl and #0 V-lock sutures with the knee   in flexion.  The   remaining wound was closed with 2-0 Vicryl and running 4-0 Monocryl.   The  knee was cleaned, dried, dressed sterilely using Dermabond and   Aquacel dressing.  The patient was then   brought to recovery room in stable condition, tolerating the procedure   well.   Please note that Physician Assistant, Nehemiah Massed, PA-C, was present for the entirety of the case, and was utilized for pre-operative positioning, peri-operative retractor management, general facilitation of the procedure.  He was also utilized for primary wound closure at the end of the case.              Pietro Cassis Alvan Dame, M.D.    08/09/2016 3:16 PM

## 2016-08-09 NOTE — Anesthesia Preprocedure Evaluation (Addendum)
Anesthesia Evaluation  Patient identified by MRN, date of birth, ID band Patient awake    Reviewed: Allergy & Precautions, H&P , Patient's Chart, lab work & pertinent test results, reviewed documented beta blocker date and time   Airway Mallampati: II  TM Distance: >3 FB Neck ROM: full    Dental no notable dental hx.    Pulmonary former smoker,    Pulmonary exam normal breath sounds clear to auscultation       Cardiovascular hypertension,  Rhythm:regular Rate:Normal     Neuro/Psych    GI/Hepatic   Endo/Other    Renal/GU      Musculoskeletal   Abdominal   Peds  Hematology  (+) anemia ,   Anesthesia Other Findings Pt denies taking anticoagulant Anemia HTN  Reproductive/Obstetrics                            Anesthesia Physical Anesthesia Plan  ASA: II  Anesthesia Plan: Spinal   Post-op Pain Management:    Induction:   Airway Management Planned:   Additional Equipment:   Intra-op Plan:   Post-operative Plan:   Informed Consent: I have reviewed the patients History and Physical, chart, labs and discussed the procedure including the risks, benefits and alternatives for the proposed anesthesia with the patient or authorized representative who has indicated his/her understanding and acceptance.   Dental Advisory Given  Plan Discussed with: CRNA  Anesthesia Plan Comments: (Lab work and procedure  confirmed with CRNA in room; platelets okay. Discussed spinal anesthetic, and patient consents to the procedure:  included risk of possible headache,backache, failed block, allergic reaction, and nerve injury. This patient was asked if she had any questions or concerns before the procedure started.  )        Anesthesia Quick Evaluation

## 2016-08-09 NOTE — Anesthesia Postprocedure Evaluation (Signed)
Anesthesia Post Note  Patient: Heidi Adams  Procedure(s) Performed: Procedure(s) (LRB): RIGHT TOTAL KNEE ARTHROPLASTY (Right)  Patient location during evaluation: PACU Anesthesia Type: Spinal Level of consciousness: awake Pain management: satisfactory to patient Vital Signs Assessment: post-procedure vital signs reviewed and stable Respiratory status: spontaneous breathing Cardiovascular status: blood pressure returned to baseline Postop Assessment: no headache and spinal receding Anesthetic complications: no       Last Vitals:  Vitals:   08/09/16 1730 08/09/16 1854  BP: (!) 180/71 (!) 166/69  Pulse: 74 72  Resp: 15 18  Temp: 36.4 C 36.4 C    Last Pain:  Vitals:   08/09/16 1854  TempSrc: Oral  PainSc:                  Riccardo Dubin

## 2016-08-09 NOTE — Discharge Instructions (Signed)

## 2016-08-10 DIAGNOSIS — M1711 Unilateral primary osteoarthritis, right knee: Secondary | ICD-10-CM | POA: Diagnosis not present

## 2016-08-10 DIAGNOSIS — Z8249 Family history of ischemic heart disease and other diseases of the circulatory system: Secondary | ICD-10-CM | POA: Diagnosis not present

## 2016-08-10 DIAGNOSIS — E663 Overweight: Secondary | ICD-10-CM | POA: Diagnosis not present

## 2016-08-10 DIAGNOSIS — Z833 Family history of diabetes mellitus: Secondary | ICD-10-CM | POA: Diagnosis not present

## 2016-08-10 DIAGNOSIS — Z6825 Body mass index (BMI) 25.0-25.9, adult: Secondary | ICD-10-CM | POA: Diagnosis not present

## 2016-08-10 DIAGNOSIS — D649 Anemia, unspecified: Secondary | ICD-10-CM | POA: Diagnosis not present

## 2016-08-10 DIAGNOSIS — Z96641 Presence of right artificial hip joint: Secondary | ICD-10-CM | POA: Diagnosis not present

## 2016-08-10 DIAGNOSIS — Z87891 Personal history of nicotine dependence: Secondary | ICD-10-CM | POA: Diagnosis not present

## 2016-08-10 DIAGNOSIS — K219 Gastro-esophageal reflux disease without esophagitis: Secondary | ICD-10-CM | POA: Diagnosis not present

## 2016-08-10 DIAGNOSIS — Z85828 Personal history of other malignant neoplasm of skin: Secondary | ICD-10-CM | POA: Diagnosis not present

## 2016-08-10 DIAGNOSIS — E039 Hypothyroidism, unspecified: Secondary | ICD-10-CM | POA: Diagnosis not present

## 2016-08-10 DIAGNOSIS — I10 Essential (primary) hypertension: Secondary | ICD-10-CM | POA: Diagnosis not present

## 2016-08-10 DIAGNOSIS — Z96652 Presence of left artificial knee joint: Secondary | ICD-10-CM | POA: Diagnosis not present

## 2016-08-10 DIAGNOSIS — Z90710 Acquired absence of both cervix and uterus: Secondary | ICD-10-CM | POA: Diagnosis not present

## 2016-08-10 DIAGNOSIS — Z888 Allergy status to other drugs, medicaments and biological substances status: Secondary | ICD-10-CM | POA: Diagnosis not present

## 2016-08-10 LAB — BASIC METABOLIC PANEL
ANION GAP: 7 (ref 5–15)
BUN: 21 mg/dL — AB (ref 6–20)
CO2: 27 mmol/L (ref 22–32)
Calcium: 8.4 mg/dL — ABNORMAL LOW (ref 8.9–10.3)
Chloride: 103 mmol/L (ref 101–111)
Creatinine, Ser: 0.65 mg/dL (ref 0.44–1.00)
GFR calc Af Amer: >60 mL/min (ref >60)
Glucose, Bld: 137 mg/dL — ABNORMAL HIGH (ref 65–99)
POTASSIUM: 3.8 mmol/L (ref 3.5–5.1)
SODIUM: 137 mmol/L (ref 135–145)

## 2016-08-10 LAB — CBC
HCT: 28.8 % — ABNORMAL LOW (ref 36.0–46.0)
Hemoglobin: 8.9 g/dL — ABNORMAL LOW (ref 12.0–15.0)
MCH: 25.6 pg — ABNORMAL LOW (ref 26.0–34.0)
MCHC: 30.9 g/dL (ref 30.0–36.0)
MCV: 82.8 fL (ref 78.0–100.0)
PLATELETS: 381 10*3/uL (ref 150–400)
RBC: 3.48 MIL/uL — AB (ref 3.87–5.11)
RDW: 16.6 % — ABNORMAL HIGH (ref 11.5–15.5)
WBC: 12.2 10*3/uL — AB (ref 4.0–10.5)

## 2016-08-10 MED ORDER — POLYETHYLENE GLYCOL 3350 17 G PO PACK: 17.0000 g | PACK | Freq: Two times a day (BID) | ORAL | 0 refills | Status: DC

## 2016-08-10 MED ORDER — ASPIRIN 81 MG PO CHEW: 81.0000 mg | CHEWABLE_TABLET | Freq: Two times a day (BID) | ORAL | 0 refills | Status: DC

## 2016-08-10 MED ORDER — ACETAMINOPHEN 500 MG PO TABS: 1000.0000 mg | ORAL_TABLET | Freq: Four times a day (QID) | ORAL | 0 refills | Status: DC

## 2016-08-10 MED ORDER — METHOCARBAMOL 500 MG PO TABS: 500.0000 mg | ORAL_TABLET | Freq: Four times a day (QID) | ORAL | 0 refills | Status: DC | PRN

## 2016-08-10 MED ORDER — OXYCODONE HCL 5 MG PO TABS: 5.0000 mg | ORAL_TABLET | ORAL | 0 refills | Status: DC | PRN

## 2016-08-10 NOTE — Evaluation (Signed)
Occupational Therapy Evaluation Patient Details Name: Heidi Adams MRN: [place]:[id number] DOB: 06-03-1936 Today's Date: 08/10/2016    History of Present Illness Pt is s/p R TKA, h/o L in 3/17   Clinical Impression   This 81 year old female was admitted for the above sx.  She will benefit from one more session of OT to increase safety and independence with bathroom transfers. Goals are for supervision to min guard level    Follow Up Recommendations  No OT follow up    Equipment Recommendations  None recommended by OT    Recommendations for Other Services       Precautions / Restrictions Precautions Precautions: Knee;Fall Restrictions Weight Bearing Restrictions: No      Mobility Bed Mobility Overal bed mobility: Needs Assistance Bed Mobility: Sit to Supine       Sit to supine: Min assist   General bed mobility comments: assist for RLE  Transfers Overall transfer level: Needs assistance   Transfers: Sit to/from Stand Sit to Stand: Min guard         General transfer comment: for safety and cues for UE placement    Balance                                            ADL Overall ADL's : Needs assistance/impaired                     Lower Body Dressing: Maximal assistance;Sit to/from stand   Toilet Transfer: Min guard;Stand-pivot;RW (recliner to bed)   Toileting- Water quality scientist and Hygiene: Min guard;Sit to/from stand         General ADL Comments: pt wanted to return to bed.  Donned underwear but she did not want to complete ADL at this time     Vision     Perception     Praxis      Pertinent Vitals/Pain       Hand Dominance     Extremity/Trunk Assessment Upper Extremity Assessment Upper Extremity Assessment: RUE deficits/detail;LUE deficits/detail RUE Deficits / Details: WFLs for adls; c/o popping in shoulders at times LUE Deficits / Details: wfls for adls; has had wrist swelling but no edema at this  time           Communication Communication Communication: No difficulties   Cognition Arousal/Alertness: Awake/alert Behavior During Therapy: WFL for tasks assessed/performed Overall Cognitive Status: Within Functional Limits for tasks assessed                     General Comments       Exercises       Shoulder Instructions      Home Living Family/patient expects to be discharged to:: Private residence Living Arrangements: Spouse/significant other Available Help at Discharge: Available PRN/intermittently               Bathroom Shower/Tub: Walk-in Psychologist, prison and probation services: Standard     Home Equipment: Bedside commode          Prior Functioning/Environment Level of Independence: Independent                 OT Problem List: Decreased strength;Decreased activity tolerance;Pain;Decreased knowledge of use of DME or AE   OT Treatment/Interventions: Self-care/ADL training;DME and/or AE instruction;Patient/family education;Therapeutic activities    OT Goals(Current goals can be found in the care plan  section) Acute Rehab OT Goals Patient Stated Goal: home OT Goal Formulation: With patient Time For Goal Achievement: 08/13/16 Potential to Achieve Goals: Good ADL Goals Pt Will Transfer to Toilet: with supervision;ambulating;bedside commode Pt Will Perform Tub/Shower Transfer: Shower transfer;with min guard assist;ambulating;shower seat  OT Frequency: Min 2X/week   Barriers to D/C:            Co-evaluation              End of Session    Activity Tolerance: Patient tolerated treatment well Patient left: in bed;with call bell/phone within reach   Time: 1122-1145 OT Time Calculation (min): 23 min Charges:  OT General Charges $OT Visit: 1 Procedure OT Evaluation $OT Eval Low Complexity: 1 Procedure G-Codes:    Shatoria [place]ooksbury Sep 06, 2016, 12:22 PM Lesle Chris, OTR/L [phone] Sep 06, 2016

## 2016-08-10 NOTE — Progress Notes (Signed)
Physical Therapy Treatment Patient Details Name: Heidi Adams MRN: [place]:[id number] DOB: 1936/02/28 Today's Date: 08/10/2016    History of Present Illness Pt is s/p R TKA, h/o L in 3/17    PT Comments    POD # 1 pm session Assisted OOB to amb to bathroom then back to bed to perform TKR TE's.  Instructed on proper sequencing and freq as well as use of ICE.    Follow Up Recommendations  Outpatient PT     Equipment Recommendations  Rolling walker with 5" wheels    Recommendations for Other Services OT consult     Precautions / Restrictions Precautions Precautions: Knee;Fall Restrictions Weight Bearing Restrictions: No    Mobility  Bed Mobility Overal bed mobility: Needs Assistance Bed Mobility: Supine to Sit;Sit to Supine     Supine to sit: Min assist Sit to supine: Min assist   General bed mobility comments: assist with R LE and increased time  Transfers Overall transfer level: Needs assistance Equipment used: Rolling walker (2 wheeled) Transfers: Sit to/from Stand Sit to Stand: Min assist         General transfer comment: cues for LE management and use of UEs to self assist  Ambulation/Gait Ambulation/Gait assistance: Min assist Ambulation Distance (Feet): 22 Feet (to and from bathroom) Assistive device: Rolling walker (2 wheeled) Gait Pattern/deviations: Step-to pattern;Decreased step length - right;Decreased step length - left;Shuffle;Trunk flexed Gait velocity: decr Gait velocity interpretation: Below normal speed for age/gender General Gait Details: cues for posture, position from RW and sequence.   Amb to and from bathroom   Stairs            Wheelchair Mobility    Modified Rankin (Stroke Patients Only)       Balance                                    Cognition Arousal/Alertness: Awake/alert Behavior During Therapy: WFL for tasks assessed/performed Overall Cognitive Status: Within Functional Limits for tasks assessed                       Exercises Total Knee Replacement TE's 10 reps B LE ankle pumps 10 reps towel squeezes 10 reps knee presses 10 reps heel slides  10 reps SAQ's 10 reps SLR's 10 reps ABD Followed by ICE   General Comments        Pertinent Vitals/Pain Pain Assessment: 0-10 Pain Score: 5  Pain Location: R knee Pain Descriptors / Indicators: Operative site guarding Pain Intervention(s): Monitored during session;Repositioned;Ice applied    Home Living Family/patient expects to be discharged to:: Private residence Living Arrangements: Spouse/significant other Available Help at Discharge: Available PRN/intermittently Type of Home: House Home Access: Stairs to enter Entrance Stairs-Rails: None Home Layout: One level Home Equipment: Bedside commode;Walker - 2 wheels      Prior Function Level of Independence: Independent          PT Goals (current goals can now be found in the care plan section) Acute Rehab PT Goals Patient Stated Goal: home PT Goal Formulation: With patient Time For Goal Achievement: 08/15/16 Potential to Achieve Goals: Good Progress towards PT goals: Progressing toward goals    Frequency    7X/week      PT Plan Current plan remains appropriate    Co-evaluation             End of Session Equipment Utilized During  Treatment: Gait belt Activity Tolerance: Patient limited by fatigue Patient left: in bed;with family/visitor present;with nursing/sitter in room;with call bell/phone within reach     Time: 1430-1515 PT Time Calculation (min) (ACUTE ONLY): 45 min  Charges:  $Gait Training: 8-22 mins $Therapeutic Exercise: 8-22 mins $Therapeutic Activity: 8-22 mins                    G Codes:      Rica Koyanagi  PTA [facility]  Acute  Rehab Pager      [phone]

## 2016-08-10 NOTE — Evaluation (Signed)
Physical Therapy Evaluation Patient Details Name: Heidi Adams MRN: [id number] DOB: [birth year] Today's Date: 08/10/2016   History of Present Illness  Pt is s/p R TKA, h/o L in 3/17  Clinical Impression  Pt s/p R TKR presents with decreased R LE strength/ROM and post op pain limiting functional mobility.  Pt should progress to dc home with family assist and has requested OP PT follow up vs HHPT.    Follow Up Recommendations Outpatient PT (at pt request)    Equipment Recommendations  Rolling walker with 5" wheels    Recommendations for Other Services OT consult     Precautions / Restrictions Precautions Precautions: Knee;Fall Restrictions Weight Bearing Restrictions: No      Mobility  Bed Mobility Overal bed mobility: Needs Assistance Bed Mobility: Supine to Sit     Supine to sit: Min assist;Mod assist Sit to supine: Min assist   General bed mobility comments: cues for sequence and use of L LE to self assist.  Physical assist to manage R LE and to bring trunk to upright  Transfers Overall transfer level: Needs assistance Equipment used: Rolling walker (2 wheeled) Transfers: Sit to/from Stand Sit to Stand: Min assist         General transfer comment: cues for LE management and use of UEs to self assist  Ambulation/Gait Ambulation/Gait assistance: Min assist Ambulation Distance (Feet): 25 Feet Assistive device: Rolling walker (2 wheeled) Gait Pattern/deviations: Step-to pattern;Decreased step length - right;Decreased step length - left;Shuffle;Trunk flexed Gait velocity: decr Gait velocity interpretation: Below normal speed for age/gender General Gait Details: cues for posture, position from RW and sequence.  Distance ltd by fatigue adn c/o dizziness - BP 133/65  Stairs            Wheelchair Mobility    Modified Rankin (Stroke Patients Only)       Balance                                             Pertinent Vitals/Pain       Home Living Family/patient expects to be discharged to:: Private residence Living Arrangements: Spouse/significant other Available Help at Discharge: Available PRN/intermittently Type of Home: House Home Access: Stairs to enter Entrance Stairs-Rails: None Technical brewer of Steps: 2+1 Home Layout: One level Home Equipment: Bedside commode;Walker - 2 wheels      Prior Function Level of Independence: Independent               Hand Dominance        Extremity/Trunk Assessment   Upper Extremity Assessment Upper Extremity Assessment: Defer to OT evaluation RUE Deficits / Details: WFLs for adls; c/o popping in shoulders at times LUE Deficits / Details: wfls for adls; has had wrist swelling but no edema at this time    Lower Extremity Assessment Lower Extremity Assessment: RLE deficits/detail RLE Deficits / Details: Quad strength 3-/5 with AAROM at knee -10 - 90    Cervical / Trunk Assessment Cervical / Trunk Assessment: Kyphotic  Communication   Communication: No difficulties  Cognition Arousal/Alertness: Awake/alert Behavior During Therapy: WFL for tasks assessed/performed Overall Cognitive Status: Within Functional Limits for tasks assessed                      General Comments      Exercises Total Joint Exercises Ankle Circles/Pumps: AROM;15 reps;Supine;Both Quad Sets: AROM;Both;Supine;10  reps Heel Slides: AAROM;Right;15 reps;Supine Straight Leg Raises: AAROM;10 reps;Supine;Right   Assessment/Plan    PT Assessment Patient needs continued PT services  PT Problem List Decreased strength;Decreased range of motion;Decreased activity tolerance;Decreased mobility;Pain;Decreased knowledge of use of DME          PT Treatment Interventions DME instruction;Gait training;Stair training;Functional mobility training;Therapeutic activities;Therapeutic exercise;Patient/family education    PT Goals (Current goals can be found in the Care Plan  section)  Acute Rehab PT Goals Patient Stated Goal: home PT Goal Formulation: With patient Time For Goal Achievement: 08/15/16 Potential to Achieve Goals: Good    Frequency 7X/week   Barriers to discharge        Co-evaluation               End of Session Equipment Utilized During Treatment: Gait belt Activity Tolerance: Patient limited by fatigue Patient left: in chair;with call bell/phone within reach;with family/visitor present Nurse Communication: Mobility status         Time: [id number] PT Time Calculation (min) (ACUTE ONLY): 50 min   Charges:   PT Evaluation $PT Eval Low Complexity: 1 Procedure PT Treatments $Gait Training: 8-22 mins $Therapeutic Exercise: 8-22 mins   PT G Codes:        Michai Dieppa 08-23-16, 1:01 PM

## 2016-08-10 NOTE — Progress Notes (Addendum)
     Subjective: 1 Day Post-Op Procedure(s) (LRB): RIGHT TOTAL KNEE ARTHROPLASTY (Right)   Patient reports pain as mild, pain controlled. No events through out the night. Many question asked and answered.  Discussed recovery of her H&H, use of iron. Will monitor  She did not do well with PT and would be unsafe to be discharged home today.   Objective:   VITALS:   Vitals:   08/10/16 0410 08/10/16 0551  BP:  (!) 152/69  Pulse: 65 71  Resp: 15 16  Temp:  97.5 F (36.4 C)    Dorsiflexion/Plantar flexion intact Incision: dressing C/D/I No cellulitis present Compartment soft  LABS  Recent Labs  08/10/16 0434  HGB 8.9*  HCT 28.8*  WBC 12.2*  PLT 381     Recent Labs  08/10/16 0434  NA 137  K 3.8  BUN 21*  CREATININE 0.65  GLUCOSE 137*     Assessment/Plan: 1 Day Post-Op Procedure(s) (LRB): RIGHT TOTAL KNEE ARTHROPLASTY (Right) Foley cath d/c'ed Advance diet Up with therapy D/C IV fluids Discharge home when ready.   Overweight (BMI 25-29.9) Estimated body mass index is 25.7 kg/m as calculated from the following:   Height as of this encounter: 5\' 4"  (1.626 m).   Weight as of this encounter: 67.9 kg (149 lb 11.2 oz). Patient also counseled that weight may inhibit the healing process Patient counseled that losing weight will help with future health issues       [place] Pugh. Jenaye Rickert   PAC  08/10/2016, 9:25 AM

## 2016-08-11 LAB — BASIC METABOLIC PANEL
Anion gap: 9 (ref 5–15)
BUN: 24 mg/dL — AB (ref 6–20)
CHLORIDE: 100 mmol/L — AB (ref 101–111)
CO2: 27 mmol/L (ref 22–32)
Calcium: 8.9 mg/dL (ref 8.9–10.3)
Creatinine, Ser: 0.86 mg/dL (ref 0.44–1.00)
GFR calc Af Amer: >60 mL/min (ref >60)
GFR calc non Af Amer: >60 mL/min (ref >60)
GLUCOSE: 123 mg/dL — AB (ref 65–99)
POTASSIUM: 3.6 mmol/L (ref 3.5–5.1)
Sodium: 136 mmol/L (ref 135–145)

## 2016-08-11 LAB — CBC
HEMATOCRIT: 28 % — AB (ref 36.0–46.0)
HEMOGLOBIN: 8.6 g/dL — AB (ref 12.0–15.0)
MCH: 25.3 pg — AB (ref 26.0–34.0)
MCHC: 30.7 g/dL (ref 30.0–36.0)
MCV: 82.4 fL (ref 78.0–100.0)
Platelets: 416 10*3/uL — ABNORMAL HIGH (ref 150–400)
RBC: 3.4 MIL/uL — AB (ref 3.87–5.11)
RDW: 16.5 % — ABNORMAL HIGH (ref 11.5–15.5)
WBC: 20.5 10*3/uL — ABNORMAL HIGH (ref 4.0–10.5)

## 2016-08-11 NOTE — Progress Notes (Signed)
Occupational Therapy Treatment Patient Details Name: NIYAH FLAMMER MRN: [place]:[id number] DOB: [birth year] Today's Date: 08/11/2016    History of present illness Pt is s/p R TKA, h/o L in 3/17   OT comments  All education completed.    Follow Up Recommendations  No OT follow up    Equipment Recommendations  None recommended by OT    Recommendations for Other Services      Precautions / Restrictions Precautions Precautions: Knee;Fall       Mobility Bed Mobility         Supine to sit: Min assist Sit to supine: Min assist   General bed mobility comments: assist with R LE and increased time  Transfers   Equipment used: Rolling walker (2 wheeled)   Sit to Stand: Supervision              Balance                                   ADL                           Toilet Transfer: Supervision/safety;Ambulation;BSC;RW   Toileting- Clothing Manipulation and Hygiene: Supervision/safety;Sit to/from stand         General ADL Comments: educated on shower transfer. She says she doesn't have room to sidestep through bathroom door to shower; she will have HHPT look at this. She has walked in without walker in the past, which I didn't recommend.  Stood at brushed teeth at supervision level also      Tourist information centre manager   Behavior During Therapy: [facility] for tasks assessed/performed Overall Cognitive Status: Within Functional Limits for tasks assessed                       Extremity/Trunk Assessment               Exercises     Shoulder Instructions       General Comments      Pertinent Vitals/ Pain       Pain Score: 5  Pain Location: R knee Pain Descriptors / Indicators: Operative site guarding Pain Intervention(s): Limited activity within patient's tolerance;Monitored during session;Premedicated before session;Repositioned;Ice applied  Home Living                                          Prior Functioning/Environment              Frequency           Progress Toward Goals  OT Goals(current goals can now be found in the care plan section)  Progress towards OT goals: Progressing toward goals (no further OT is needed)     Plan      Co-evaluation                 End of Session     Activity Tolerance Patient tolerated treatment well   Patient Left in bed;with call bell/phone within reach;with family/visitor present   Nurse Communication          Time: [id number] OT Time Calculation (min):  19 min  Charges: OT General Charges $OT Visit: 1 Procedure OT Treatments $Self Care/Home Management : 8-22 mins  Shakayla Hickox 08/11/2016, 9:52 AM Lesle Chris, OTR/L [phone] 08/11/2016

## 2016-08-11 NOTE — Progress Notes (Signed)
Physical Therapy Treatment Patient Details Name: Heidi Adams MRN: [id number] DOB: 07/23/1936 Today's Date: 08/11/2016    History of Present Illness Pt is s/p R TKA, h/o L in 3/17    PT Comments    POD # 2 Assisted OOB to bathroom then in hallway.  Spouse present and educated "hands on" safe handling with all mobility.  Practed 2 steps backward due to no rails and one step forward.  Performed all supine TE's and applied ICE. Pt ready for D/C to home  Follow Up Recommendations  Home health PT (MD changed to [facility] per pt request)     Equipment Recommendations  Rolling walker with 5" wheels    Recommendations for Other Services       Precautions / Restrictions Precautions Precautions: Knee;Fall Restrictions Weight Bearing Restrictions: No    Mobility  Bed Mobility Overal bed mobility: Needs Assistance Bed Mobility: Supine to Sit;Sit to Supine     Supine to sit: Min guard Sit to supine: Min guard   General bed mobility comments: assist with R LE and increased time  Transfers Overall transfer level: Needs assistance Equipment used: Rolling walker (2 wheeled) Transfers: Sit to/from Stand Sit to Stand: Supervision         General transfer comment: cues for LE management and use of UEs to self assist  Ambulation/Gait Ambulation/Gait assistance: Supervision;Min guard Ambulation Distance (Feet): 85 Feet Assistive device: Rolling walker (2 wheeled) Gait Pattern/deviations: Step-to pattern;Decreased step length - right;Decreased step length - left;Shuffle;Trunk flexed Gait velocity: decr   General Gait Details: cues for posture, position from RW and sequence.   Amb to and from bathroom plus in hallway.  Spouse present so educated on safe handling.     Stairs    2 steps backward Min Assist with spouse One step forward with spouse         Wheelchair Mobility    Modified Rankin (Stroke Patients Only)       Balance                                    Cognition Arousal/Alertness: Awake/alert Behavior During Therapy: WFL for tasks assessed/performed Overall Cognitive Status: Within Functional Limits for tasks assessed                      Exercises      General Comments        Pertinent Vitals/Pain Pain Assessment: 0-10 Pain Score: 6  Pain Location: R knee Pain Descriptors / Indicators: Operative site guarding;Discomfort Pain Intervention(s): Monitored during session;Repositioned;Ice applied    Home Living                      Prior Function            PT Goals (current goals can now be found in the care plan section) Progress towards PT goals: Progressing toward goals    Frequency    7X/week      PT Plan Current plan remains appropriate    Co-evaluation             End of Session Equipment Utilized During Treatment: Gait belt Activity Tolerance: Patient limited by fatigue Patient left: in bed;with call bell/phone within reach;with family/visitor present     Time: [id number] PT Time Calculation (min) (ACUTE ONLY): 43 min  Charges:  $Gait Training: 8-22 mins $Therapeutic Exercise: 8-22 mins $Therapeutic Activity:  8-22 mins                    G Codes:      Rica Koyanagi  PTA [facility]  Acute  Rehab Pager      [phone]

## 2016-08-11 NOTE — Progress Notes (Signed)
     Subjective: 2 Days Post-Op Procedure(s) (LRB): RIGHT TOTAL KNEE ARTHROPLASTY (Right)   Patient reports pain as mild, pain controlled. No events throughout the night. Feels that she did well with PT.  Ready to be discharged home.   Objective:   VITALS:   Vitals:   08/10/16 2151 08/11/16 0705  BP: 107/63 (!) 156/66  Pulse: 70 70  Resp: 16 16  Temp: 97.6 F (36.4 C) 98.1 F (36.7 C)    Dorsiflexion/Plantar flexion intact Incision: dressing C/D/I No cellulitis present Compartment soft  LABS  Recent Labs  08/10/16 0434 08/11/16 0435  HGB 8.9* 8.6*  HCT 28.8* 28.0*  WBC 12.2* 20.5*  PLT 381 416*     Recent Labs  08/10/16 0434 08/11/16 0435  NA 137 136  K 3.8 3.6  BUN 21* 24*  CREATININE 0.65 0.86  GLUCOSE 137* 123*     Assessment/Plan: 2 Days Post-Op Procedure(s) (LRB): RIGHT TOTAL KNEE ARTHROPLASTY (Right) Up with therapy Discharge home Follow up in 2 weeks at [facility] Follow up with OLIN,Radha Coggins D in 2 weeks.  Contact information:  [facility] [address] [place] Timarion Agcaoili   PAC  08/11/2016, 12:48 PM

## 2016-08-14 DIAGNOSIS — Z7982 Long term (current) use of aspirin: Secondary | ICD-10-CM | POA: Diagnosis not present

## 2016-08-14 DIAGNOSIS — Z85828 Personal history of other malignant neoplasm of skin: Secondary | ICD-10-CM | POA: Diagnosis not present

## 2016-08-14 DIAGNOSIS — Z96641 Presence of right artificial hip joint: Secondary | ICD-10-CM | POA: Diagnosis not present

## 2016-08-14 DIAGNOSIS — Z96653 Presence of artificial knee joint, bilateral: Secondary | ICD-10-CM | POA: Diagnosis not present

## 2016-08-14 DIAGNOSIS — I10 Essential (primary) hypertension: Secondary | ICD-10-CM | POA: Diagnosis not present

## 2016-08-14 DIAGNOSIS — Z471 Aftercare following joint replacement surgery: Secondary | ICD-10-CM | POA: Diagnosis not present

## 2016-08-14 DIAGNOSIS — K219 Gastro-esophageal reflux disease without esophagitis: Secondary | ICD-10-CM | POA: Diagnosis not present

## 2016-08-14 DIAGNOSIS — Z87891 Personal history of nicotine dependence: Secondary | ICD-10-CM | POA: Diagnosis not present

## 2016-08-14 DIAGNOSIS — E039 Hypothyroidism, unspecified: Secondary | ICD-10-CM | POA: Diagnosis not present

## 2016-08-14 DIAGNOSIS — Z9181 History of falling: Secondary | ICD-10-CM | POA: Diagnosis not present

## 2016-08-14 DIAGNOSIS — Z79891 Long term (current) use of opiate analgesic: Secondary | ICD-10-CM | POA: Diagnosis not present

## 2016-08-14 DIAGNOSIS — M1991 Primary osteoarthritis, unspecified site: Secondary | ICD-10-CM | POA: Diagnosis not present

## 2016-08-16 NOTE — Discharge Summary (Signed)
Physician Discharge Summary  Patient ID: Heidi Adams MRN: [id number] DOB/AGE: January 10, 1936 81 y.o.  Admit date: 08/09/2016 Discharge date: 08/11/2016   Procedures:  Procedure(s) (LRB): RIGHT TOTAL KNEE ARTHROPLASTY (Right)  Attending Physician:  Dr. Paralee Cancel   Admission Diagnoses:   Right knee primary OA / pain  Discharge Diagnoses:  Active Problems:   Overweight (BMI 25.0-29.9)   S/P total knee replacement, right  Past Medical History:  Diagnosis Date  . Arthritis   . Dysrhythmia    went into Atrial Fibrillation after last surgery  . History of skin cancer   . Hypertension   . PVC (premature ventricular contraction)   . Thyroiditis 1973    HPI:    Heidi Adams, 81 y.o. female, has a history of pain and functional disability in the right knee due to arthritis and has failed non-surgical conservative treatments for greater than 12 weeks to include NSAID's and/or analgesics, use of assistive devices and activity modification.  Onset of symptoms was abrupt, starting in March 2018  with gradually worsening course since that time. The patient noted no past surgery on the right knee(s).  Patient currently rates pain in the right knee(s) at 10 out of 10 with activity. Patient has night pain, worsening of pain with activity and weight bearing, pain that interferes with activities of daily living, pain with passive range of motion, crepitus and joint swelling.  Patient has evidence of periarticular osteophytes and joint space narrowing by imaging studies.  There is no active infection.  Risks, benefits and expectations were discussed with the patient.  Risks including but not limited to the risk of anesthesia, blood clots, nerve damage, blood vessel damage, failure of the prosthesis, infection and up to and including death.  Patient understand the risks, benefits and expectations and wishes to proceed with surgery.  PCP: Precious Reel, MD   Discharged Condition: good  Hospital  Course:  Patient underwent the above stated procedure on 08/09/2016. Patient tolerated the procedure well and brought to the recovery room in good condition and subsequently to the floor.  POD #1 BP: 152/69 ; Pulse: 71 ; Temp: 97.5 F (36.4 C) ; Resp: 16 Patient reports pain as mild, pain controlled. No events through out the night. Many question asked and answered.  Discussed recovery of her H&H, use of iron. Will monitor  She did not do well with PT and would be unsafe to be discharged home today.  Dorsiflexion/plantar flexion intact, incision: dressing C/D/I, no cellulitis present and compartment soft.   LABS  Basename    HGB     8.9  HCT     28.8   POD #2  BP: 156/66 ; Pulse: 70 ; Temp: 98.1 F (36.7 C) ; Resp: 16 Patient reports pain as mild, pain controlled. No events throughout the night. Feels that she did well with PT.  Ready to be discharged home. Dorsiflexion/plantar flexion intact, incision: dressing C/D/I, no cellulitis present and compartment soft.   LABS  Basename    HGB     8.6  HCT     28.0    Discharge Exam: General appearance: alert, cooperative and no distress Extremities: Homans sign is negative, no sign of DVT, no edema, redness or tenderness in the calves or thighs and no ulcers, gangrene or trophic changes  Disposition: Home with follow up in 2 weeks   Follow-up Information    Heidi Pole, MD. Schedule an appointment as soon as possible for a visit in 2  week(s).   Specialty:  Orthopedic Surgery Contact information: [address] [place] [address] [id number]           Discharge Instructions    Call MD / Call 911    Complete by:  As directed    If you experience chest pain or shortness of breath, CALL 911 and be transported to the hospital emergency room.  If you develope a fever above 101 F, pus (white drainage) or increased drainage or redness at the wound, or calf pain, call your surgeon's office.   Change dressing     Complete by:  As directed    Maintain surgical dressing until follow up in the clinic. If the edges start to pull up, may reinforce with tape. If the dressing is no longer working, may remove and cover with gauze and tape, but must keep the area dry and clean.  Call with any questions or concerns.   Constipation Prevention    Complete by:  As directed    Drink plenty of fluids.  Prune juice may be helpful.  You may use a stool softener, such as Colace (over the counter) 100 mg twice a day.  Use MiraLax (over the counter) for constipation as needed.   Diet - low sodium heart healthy    Complete by:  As directed    Discharge instructions    Complete by:  As directed    Maintain surgical dressing until follow up in the clinic. If the edges start to pull up, may reinforce with tape. If the dressing is no longer working, may remove and cover with gauze and tape, but must keep the area dry and clean.  Follow up in 2 weeks at [facility]. Call with any questions or concerns.   Increase activity slowly as tolerated    Complete by:  As directed    Weight bearing as tolerated with assist device (walker, cane, etc) as directed, use it as long as suggested by your surgeon or therapist, typically at least 4-6 weeks.   TED hose    Complete by:  As directed    Use stockings (TED hose) for 2 weeks on both leg(s).  You may remove them at night for sleeping.      Allergies as of 08/11/2016      Reactions   Codeine Nausea Only   Epinephrine    Tachcardyia, chest pains tremors   Tenormin [atenolol] Rash      Medication List    STOP taking these medications   aspirin EC 81 MG tablet Replaced by:  aspirin 81 MG chewable tablet     TAKE these medications   acetaminophen 500 MG tablet Commonly known as:  TYLENOL Take 2 tablets (1,000 mg total) by mouth every 6 (six) hours. What changed:  when to take this   amiodarone 200 MG tablet Commonly known as:  PACERONE Take 200 mg by mouth at  bedtime. Last dose was Saturday 07/30/2016 until see Cardiologist on 08/04/2016- trying to see what medication had ill effects on her   aspirin 81 MG chewable tablet Chew 1 tablet (81 mg total) by mouth 2 (two) times daily. Take for 4 weeks, then resume regular dose. Replaces:  aspirin EC 81 MG tablet   docusate sodium 100 MG capsule Commonly known as:  COLACE Take 1 capsule (100 mg total) by mouth 2 (two) times daily. What changed:  when to take this  reasons to take this   ferrous sulfate 325 (65  FE) MG tablet Take 1 tablet (325 mg total) by mouth 3 (three) times daily after meals.   levothyroxine 125 MCG tablet Commonly known as:  SYNTHROID, LEVOTHROID Take 125 mcg by mouth daily.   methocarbamol 500 MG tablet Commonly known as:  ROBAXIN Take 1 tablet (500 mg total) by mouth every 6 (six) hours as needed for muscle spasms.   oxyCODONE 5 MG immediate release tablet Commonly known as:  Oxy IR/ROXICODONE Take 1-3 tablets (5-15 mg total) by mouth every 4 (four) hours as needed for moderate pain. What changed:  reasons to take this   pantoprazole 40 MG tablet Commonly known as:  PROTONIX Take 40 mg by mouth daily as needed (indigestion).   polyethylene glycol packet Commonly known as:  MIRALAX / GLYCOLAX Take 17 g by mouth 2 (two) times daily. What changed:  when to take this  reasons to take this   triamterene-hydrochlorothiazide 37.5-25 MG tablet Commonly known as:  MAXZIDE-25 Take 1 tablet by mouth daily.        Signed: West Pugh. Kwane Rohl   PA-C  08/16/2016, 9:38 AM

## 2016-08-22 DIAGNOSIS — Z87891 Personal history of nicotine dependence: Secondary | ICD-10-CM | POA: Diagnosis not present

## 2016-08-22 DIAGNOSIS — R202 Paresthesia of skin: Secondary | ICD-10-CM | POA: Diagnosis not present

## 2016-08-22 DIAGNOSIS — R200 Anesthesia of skin: Secondary | ICD-10-CM | POA: Diagnosis not present

## 2016-08-22 DIAGNOSIS — M1991 Primary osteoarthritis, unspecified site: Secondary | ICD-10-CM | POA: Diagnosis not present

## 2016-08-22 DIAGNOSIS — Z7982 Long term (current) use of aspirin: Secondary | ICD-10-CM | POA: Diagnosis not present

## 2016-08-22 DIAGNOSIS — Z9181 History of falling: Secondary | ICD-10-CM | POA: Diagnosis not present

## 2016-08-22 DIAGNOSIS — Z96641 Presence of right artificial hip joint: Secondary | ICD-10-CM | POA: Diagnosis not present

## 2016-08-22 DIAGNOSIS — E039 Hypothyroidism, unspecified: Secondary | ICD-10-CM | POA: Diagnosis not present

## 2016-08-22 DIAGNOSIS — Z96653 Presence of artificial knee joint, bilateral: Secondary | ICD-10-CM | POA: Diagnosis not present

## 2016-08-22 DIAGNOSIS — Z471 Aftercare following joint replacement surgery: Secondary | ICD-10-CM | POA: Diagnosis not present

## 2016-08-22 DIAGNOSIS — K219 Gastro-esophageal reflux disease without esophagitis: Secondary | ICD-10-CM | POA: Diagnosis not present

## 2016-08-22 DIAGNOSIS — Z79891 Long term (current) use of opiate analgesic: Secondary | ICD-10-CM | POA: Diagnosis not present

## 2016-08-22 DIAGNOSIS — M19022 Primary osteoarthritis, left elbow: Secondary | ICD-10-CM | POA: Diagnosis not present

## 2016-08-22 DIAGNOSIS — Z85828 Personal history of other malignant neoplasm of skin: Secondary | ICD-10-CM | POA: Diagnosis not present

## 2016-08-22 DIAGNOSIS — I10 Essential (primary) hypertension: Secondary | ICD-10-CM | POA: Diagnosis not present

## 2016-08-25 DIAGNOSIS — I10 Essential (primary) hypertension: Secondary | ICD-10-CM | POA: Diagnosis not present

## 2016-08-25 DIAGNOSIS — E876 Hypokalemia: Secondary | ICD-10-CM | POA: Diagnosis not present

## 2016-08-29 DIAGNOSIS — M25561 Pain in right knee: Secondary | ICD-10-CM | POA: Diagnosis not present

## 2016-08-29 DIAGNOSIS — Z96651 Presence of right artificial knee joint: Secondary | ICD-10-CM | POA: Diagnosis not present

## 2016-08-29 DIAGNOSIS — R2689 Other abnormalities of gait and mobility: Secondary | ICD-10-CM | POA: Diagnosis not present

## 2016-08-29 DIAGNOSIS — M62551 Muscle wasting and atrophy, not elsewhere classified, right thigh: Secondary | ICD-10-CM | POA: Diagnosis not present

## 2016-09-01 DIAGNOSIS — M25561 Pain in right knee: Secondary | ICD-10-CM | POA: Diagnosis not present

## 2016-09-01 DIAGNOSIS — R2689 Other abnormalities of gait and mobility: Secondary | ICD-10-CM | POA: Diagnosis not present

## 2016-09-01 DIAGNOSIS — M62551 Muscle wasting and atrophy, not elsewhere classified, right thigh: Secondary | ICD-10-CM | POA: Diagnosis not present

## 2016-09-01 DIAGNOSIS — Z96651 Presence of right artificial knee joint: Secondary | ICD-10-CM | POA: Diagnosis not present

## 2016-09-05 DIAGNOSIS — R55 Syncope and collapse: Secondary | ICD-10-CM | POA: Diagnosis not present

## 2016-09-05 DIAGNOSIS — I2510 Atherosclerotic heart disease of native coronary artery without angina pectoris: Secondary | ICD-10-CM | POA: Diagnosis not present

## 2016-09-05 DIAGNOSIS — Z87891 Personal history of nicotine dependence: Secondary | ICD-10-CM | POA: Diagnosis not present

## 2016-09-05 DIAGNOSIS — I700 Atherosclerosis of aorta: Secondary | ICD-10-CM | POA: Diagnosis not present

## 2016-09-05 DIAGNOSIS — I517 Cardiomegaly: Secondary | ICD-10-CM | POA: Diagnosis not present

## 2016-09-05 DIAGNOSIS — J9811 Atelectasis: Secondary | ICD-10-CM | POA: Diagnosis not present

## 2016-09-05 DIAGNOSIS — R1110 Vomiting, unspecified: Secondary | ICD-10-CM | POA: Diagnosis not present

## 2016-09-05 DIAGNOSIS — I493 Ventricular premature depolarization: Secondary | ICD-10-CM | POA: Diagnosis not present

## 2016-09-05 DIAGNOSIS — Z96641 Presence of right artificial hip joint: Secondary | ICD-10-CM | POA: Diagnosis not present

## 2016-09-05 DIAGNOSIS — E079 Disorder of thyroid, unspecified: Secondary | ICD-10-CM | POA: Diagnosis not present

## 2016-09-05 DIAGNOSIS — K5900 Constipation, unspecified: Secondary | ICD-10-CM | POA: Diagnosis not present

## 2016-09-05 DIAGNOSIS — I10 Essential (primary) hypertension: Secondary | ICD-10-CM | POA: Diagnosis not present

## 2016-09-05 DIAGNOSIS — R1011 Right upper quadrant pain: Secondary | ICD-10-CM | POA: Diagnosis not present

## 2016-09-05 DIAGNOSIS — R932 Abnormal findings on diagnostic imaging of liver and biliary tract: Secondary | ICD-10-CM | POA: Diagnosis not present

## 2016-09-05 DIAGNOSIS — K5730 Diverticulosis of large intestine without perforation or abscess without bleeding: Secondary | ICD-10-CM | POA: Diagnosis not present

## 2016-09-06 DIAGNOSIS — M62551 Muscle wasting and atrophy, not elsewhere classified, right thigh: Secondary | ICD-10-CM | POA: Diagnosis not present

## 2016-09-06 DIAGNOSIS — Z96651 Presence of right artificial knee joint: Secondary | ICD-10-CM | POA: Diagnosis not present

## 2016-09-06 DIAGNOSIS — M25561 Pain in right knee: Secondary | ICD-10-CM | POA: Diagnosis not present

## 2016-09-06 DIAGNOSIS — R2689 Other abnormalities of gait and mobility: Secondary | ICD-10-CM | POA: Diagnosis not present

## 2016-09-07 DIAGNOSIS — K8000 Calculus of gallbladder with acute cholecystitis without obstruction: Secondary | ICD-10-CM | POA: Diagnosis not present

## 2016-09-07 DIAGNOSIS — D649 Anemia, unspecified: Secondary | ICD-10-CM | POA: Diagnosis not present

## 2016-09-07 DIAGNOSIS — K811 Chronic cholecystitis: Secondary | ICD-10-CM | POA: Diagnosis not present

## 2016-09-07 DIAGNOSIS — I493 Ventricular premature depolarization: Secondary | ICD-10-CM | POA: Diagnosis not present

## 2016-09-07 DIAGNOSIS — M545 Low back pain: Secondary | ICD-10-CM | POA: Diagnosis not present

## 2016-09-07 DIAGNOSIS — K828 Other specified diseases of gallbladder: Secondary | ICD-10-CM | POA: Diagnosis not present

## 2016-09-07 DIAGNOSIS — K810 Acute cholecystitis: Secondary | ICD-10-CM | POA: Diagnosis not present

## 2016-09-07 DIAGNOSIS — R1013 Epigastric pain: Secondary | ICD-10-CM | POA: Diagnosis not present

## 2016-09-07 DIAGNOSIS — K8080 Other cholelithiasis without obstruction: Secondary | ICD-10-CM | POA: Diagnosis not present

## 2016-09-07 DIAGNOSIS — Z87891 Personal history of nicotine dependence: Secondary | ICD-10-CM | POA: Diagnosis not present

## 2016-09-07 DIAGNOSIS — Z90710 Acquired absence of both cervix and uterus: Secondary | ICD-10-CM | POA: Diagnosis not present

## 2016-09-07 DIAGNOSIS — Z96649 Presence of unspecified artificial hip joint: Secondary | ICD-10-CM | POA: Diagnosis not present

## 2016-09-07 DIAGNOSIS — Z7982 Long term (current) use of aspirin: Secondary | ICD-10-CM | POA: Diagnosis not present

## 2016-09-07 DIAGNOSIS — I10 Essential (primary) hypertension: Secondary | ICD-10-CM | POA: Diagnosis not present

## 2016-09-07 DIAGNOSIS — Z9889 Other specified postprocedural states: Secondary | ICD-10-CM | POA: Diagnosis not present

## 2016-09-07 DIAGNOSIS — E039 Hypothyroidism, unspecified: Secondary | ICD-10-CM | POA: Diagnosis not present

## 2016-09-07 DIAGNOSIS — R1011 Right upper quadrant pain: Secondary | ICD-10-CM | POA: Diagnosis not present

## 2016-09-07 DIAGNOSIS — K8012 Calculus of gallbladder with acute and chronic cholecystitis without obstruction: Secondary | ICD-10-CM | POA: Diagnosis not present

## 2016-09-07 DIAGNOSIS — K8020 Calculus of gallbladder without cholecystitis without obstruction: Secondary | ICD-10-CM | POA: Diagnosis not present

## 2016-09-07 DIAGNOSIS — Z79899 Other long term (current) drug therapy: Secondary | ICD-10-CM | POA: Diagnosis not present

## 2016-09-12 DIAGNOSIS — M62551 Muscle wasting and atrophy, not elsewhere classified, right thigh: Secondary | ICD-10-CM | POA: Diagnosis not present

## 2016-09-12 DIAGNOSIS — Z96651 Presence of right artificial knee joint: Secondary | ICD-10-CM | POA: Diagnosis not present

## 2016-09-12 DIAGNOSIS — M25561 Pain in right knee: Secondary | ICD-10-CM | POA: Diagnosis not present

## 2016-09-12 DIAGNOSIS — R2689 Other abnormalities of gait and mobility: Secondary | ICD-10-CM | POA: Diagnosis not present

## 2016-09-13 DIAGNOSIS — E876 Hypokalemia: Secondary | ICD-10-CM | POA: Diagnosis not present

## 2016-09-15 DIAGNOSIS — M25561 Pain in right knee: Secondary | ICD-10-CM | POA: Diagnosis not present

## 2016-09-15 DIAGNOSIS — R2689 Other abnormalities of gait and mobility: Secondary | ICD-10-CM | POA: Diagnosis not present

## 2016-09-15 DIAGNOSIS — M62551 Muscle wasting and atrophy, not elsewhere classified, right thigh: Secondary | ICD-10-CM | POA: Diagnosis not present

## 2016-09-15 DIAGNOSIS — Z96651 Presence of right artificial knee joint: Secondary | ICD-10-CM | POA: Diagnosis not present

## 2016-09-16 DIAGNOSIS — I491 Atrial premature depolarization: Secondary | ICD-10-CM | POA: Diagnosis not present

## 2016-09-16 DIAGNOSIS — I10 Essential (primary) hypertension: Secondary | ICD-10-CM | POA: Diagnosis not present

## 2016-09-16 DIAGNOSIS — I480 Paroxysmal atrial fibrillation: Secondary | ICD-10-CM | POA: Diagnosis not present

## 2016-09-19 DIAGNOSIS — Z96651 Presence of right artificial knee joint: Secondary | ICD-10-CM | POA: Diagnosis not present

## 2016-09-19 DIAGNOSIS — M62551 Muscle wasting and atrophy, not elsewhere classified, right thigh: Secondary | ICD-10-CM | POA: Diagnosis not present

## 2016-09-19 DIAGNOSIS — R2689 Other abnormalities of gait and mobility: Secondary | ICD-10-CM | POA: Diagnosis not present

## 2016-09-19 DIAGNOSIS — M25561 Pain in right knee: Secondary | ICD-10-CM | POA: Diagnosis not present

## 2016-09-21 DIAGNOSIS — Z471 Aftercare following joint replacement surgery: Secondary | ICD-10-CM | POA: Diagnosis not present

## 2016-09-21 DIAGNOSIS — Z96653 Presence of artificial knee joint, bilateral: Secondary | ICD-10-CM | POA: Diagnosis not present

## 2016-09-22 DIAGNOSIS — M62551 Muscle wasting and atrophy, not elsewhere classified, right thigh: Secondary | ICD-10-CM | POA: Diagnosis not present

## 2016-09-22 DIAGNOSIS — Z96651 Presence of right artificial knee joint: Secondary | ICD-10-CM | POA: Diagnosis not present

## 2016-09-22 DIAGNOSIS — M25561 Pain in right knee: Secondary | ICD-10-CM | POA: Diagnosis not present

## 2016-09-22 DIAGNOSIS — R2689 Other abnormalities of gait and mobility: Secondary | ICD-10-CM | POA: Diagnosis not present

## 2016-09-27 DIAGNOSIS — E038 Other specified hypothyroidism: Secondary | ICD-10-CM | POA: Diagnosis not present

## 2016-09-27 DIAGNOSIS — R2689 Other abnormalities of gait and mobility: Secondary | ICD-10-CM | POA: Diagnosis not present

## 2016-09-27 DIAGNOSIS — R634 Abnormal weight loss: Secondary | ICD-10-CM | POA: Diagnosis not present

## 2016-09-27 DIAGNOSIS — M25561 Pain in right knee: Secondary | ICD-10-CM | POA: Diagnosis not present

## 2016-09-27 DIAGNOSIS — Z96651 Presence of right artificial knee joint: Secondary | ICD-10-CM | POA: Diagnosis not present

## 2016-09-27 DIAGNOSIS — M62551 Muscle wasting and atrophy, not elsewhere classified, right thigh: Secondary | ICD-10-CM | POA: Diagnosis not present

## 2016-09-29 DIAGNOSIS — M62551 Muscle wasting and atrophy, not elsewhere classified, right thigh: Secondary | ICD-10-CM | POA: Diagnosis not present

## 2016-09-29 DIAGNOSIS — M25561 Pain in right knee: Secondary | ICD-10-CM | POA: Diagnosis not present

## 2016-09-29 DIAGNOSIS — Z96651 Presence of right artificial knee joint: Secondary | ICD-10-CM | POA: Diagnosis not present

## 2016-09-29 DIAGNOSIS — R2689 Other abnormalities of gait and mobility: Secondary | ICD-10-CM | POA: Diagnosis not present

## 2016-10-04 DIAGNOSIS — M62551 Muscle wasting and atrophy, not elsewhere classified, right thigh: Secondary | ICD-10-CM | POA: Diagnosis not present

## 2016-10-04 DIAGNOSIS — Z96651 Presence of right artificial knee joint: Secondary | ICD-10-CM | POA: Diagnosis not present

## 2016-10-04 DIAGNOSIS — M25561 Pain in right knee: Secondary | ICD-10-CM | POA: Diagnosis not present

## 2016-10-04 DIAGNOSIS — R2689 Other abnormalities of gait and mobility: Secondary | ICD-10-CM | POA: Diagnosis not present

## 2016-10-06 DIAGNOSIS — R2689 Other abnormalities of gait and mobility: Secondary | ICD-10-CM | POA: Diagnosis not present

## 2016-10-06 DIAGNOSIS — M62551 Muscle wasting and atrophy, not elsewhere classified, right thigh: Secondary | ICD-10-CM | POA: Diagnosis not present

## 2016-10-06 DIAGNOSIS — Z96651 Presence of right artificial knee joint: Secondary | ICD-10-CM | POA: Diagnosis not present

## 2016-10-06 DIAGNOSIS — M25561 Pain in right knee: Secondary | ICD-10-CM | POA: Diagnosis not present

## 2016-10-07 DIAGNOSIS — I10 Essential (primary) hypertension: Secondary | ICD-10-CM | POA: Diagnosis not present

## 2016-10-07 DIAGNOSIS — E038 Other specified hypothyroidism: Secondary | ICD-10-CM | POA: Diagnosis not present

## 2016-10-07 DIAGNOSIS — E784 Other hyperlipidemia: Secondary | ICD-10-CM | POA: Diagnosis not present

## 2016-10-07 DIAGNOSIS — R634 Abnormal weight loss: Secondary | ICD-10-CM | POA: Diagnosis not present

## 2016-10-07 DIAGNOSIS — I878 Other specified disorders of veins: Secondary | ICD-10-CM | POA: Diagnosis not present

## 2016-10-07 DIAGNOSIS — M1990 Unspecified osteoarthritis, unspecified site: Secondary | ICD-10-CM | POA: Diagnosis not present

## 2016-10-07 DIAGNOSIS — Z6825 Body mass index (BMI) 25.0-25.9, adult: Secondary | ICD-10-CM | POA: Diagnosis not present

## 2016-10-07 DIAGNOSIS — I480 Paroxysmal atrial fibrillation: Secondary | ICD-10-CM | POA: Diagnosis not present

## 2016-10-11 DIAGNOSIS — Z96651 Presence of right artificial knee joint: Secondary | ICD-10-CM | POA: Diagnosis not present

## 2016-10-11 DIAGNOSIS — M25561 Pain in right knee: Secondary | ICD-10-CM | POA: Diagnosis not present

## 2016-10-11 DIAGNOSIS — R2689 Other abnormalities of gait and mobility: Secondary | ICD-10-CM | POA: Diagnosis not present

## 2016-10-11 DIAGNOSIS — M62551 Muscle wasting and atrophy, not elsewhere classified, right thigh: Secondary | ICD-10-CM | POA: Diagnosis not present

## 2016-10-13 DIAGNOSIS — R2689 Other abnormalities of gait and mobility: Secondary | ICD-10-CM | POA: Diagnosis not present

## 2016-10-13 DIAGNOSIS — M25561 Pain in right knee: Secondary | ICD-10-CM | POA: Diagnosis not present

## 2016-10-13 DIAGNOSIS — M62551 Muscle wasting and atrophy, not elsewhere classified, right thigh: Secondary | ICD-10-CM | POA: Diagnosis not present

## 2016-10-13 DIAGNOSIS — Z96651 Presence of right artificial knee joint: Secondary | ICD-10-CM | POA: Diagnosis not present

## 2016-10-14 DIAGNOSIS — Z6826 Body mass index (BMI) 26.0-26.9, adult: Secondary | ICD-10-CM | POA: Diagnosis not present

## 2016-10-14 DIAGNOSIS — I480 Paroxysmal atrial fibrillation: Secondary | ICD-10-CM | POA: Diagnosis not present

## 2016-10-14 DIAGNOSIS — R634 Abnormal weight loss: Secondary | ICD-10-CM | POA: Diagnosis not present

## 2016-10-14 DIAGNOSIS — E038 Other specified hypothyroidism: Secondary | ICD-10-CM | POA: Diagnosis not present

## 2016-10-14 DIAGNOSIS — E668 Other obesity: Secondary | ICD-10-CM | POA: Diagnosis not present

## 2016-10-14 DIAGNOSIS — I10 Essential (primary) hypertension: Secondary | ICD-10-CM | POA: Diagnosis not present

## 2016-10-14 DIAGNOSIS — Z1389 Encounter for screening for other disorder: Secondary | ICD-10-CM | POA: Diagnosis not present

## 2016-10-14 DIAGNOSIS — Z0000 Encounter for general adult medical examination without abnormal findings: Secondary | ICD-10-CM | POA: Diagnosis not present

## 2016-10-14 DIAGNOSIS — E784 Other hyperlipidemia: Secondary | ICD-10-CM | POA: Diagnosis not present

## 2016-10-14 DIAGNOSIS — I878 Other specified disorders of veins: Secondary | ICD-10-CM | POA: Diagnosis not present

## 2016-10-14 DIAGNOSIS — I951 Orthostatic hypotension: Secondary | ICD-10-CM | POA: Diagnosis not present

## 2016-10-14 DIAGNOSIS — D649 Anemia, unspecified: Secondary | ICD-10-CM | POA: Diagnosis not present

## 2016-10-18 DIAGNOSIS — M62551 Muscle wasting and atrophy, not elsewhere classified, right thigh: Secondary | ICD-10-CM | POA: Diagnosis not present

## 2016-10-18 DIAGNOSIS — R2689 Other abnormalities of gait and mobility: Secondary | ICD-10-CM | POA: Diagnosis not present

## 2016-10-18 DIAGNOSIS — Z96653 Presence of artificial knee joint, bilateral: Secondary | ICD-10-CM | POA: Diagnosis not present

## 2016-10-18 DIAGNOSIS — M25561 Pain in right knee: Secondary | ICD-10-CM | POA: Diagnosis not present

## 2016-10-18 DIAGNOSIS — Z96651 Presence of right artificial knee joint: Secondary | ICD-10-CM | POA: Diagnosis not present

## 2016-10-20 DIAGNOSIS — M25561 Pain in right knee: Secondary | ICD-10-CM | POA: Diagnosis not present

## 2016-10-20 DIAGNOSIS — R2689 Other abnormalities of gait and mobility: Secondary | ICD-10-CM | POA: Diagnosis not present

## 2016-10-20 DIAGNOSIS — M62551 Muscle wasting and atrophy, not elsewhere classified, right thigh: Secondary | ICD-10-CM | POA: Diagnosis not present

## 2016-10-20 DIAGNOSIS — Z96651 Presence of right artificial knee joint: Secondary | ICD-10-CM | POA: Diagnosis not present

## 2016-10-25 ENCOUNTER — Encounter: Payer: Self-pay | Admitting: Podiatry

## 2016-10-25 ENCOUNTER — Ambulatory Visit: Payer: PPO | Admitting: Podiatry

## 2016-10-25 ENCOUNTER — Ambulatory Visit (INDEPENDENT_AMBULATORY_CARE_PROVIDER_SITE_OTHER): Payer: PPO | Admitting: Podiatry

## 2016-10-25 DIAGNOSIS — M25561 Pain in right knee: Secondary | ICD-10-CM | POA: Diagnosis not present

## 2016-10-25 DIAGNOSIS — M2170 Unequal limb length (acquired), unspecified site: Secondary | ICD-10-CM | POA: Diagnosis not present

## 2016-10-25 DIAGNOSIS — R2689 Other abnormalities of gait and mobility: Secondary | ICD-10-CM | POA: Diagnosis not present

## 2016-10-25 DIAGNOSIS — Z96651 Presence of right artificial knee joint: Secondary | ICD-10-CM | POA: Diagnosis not present

## 2016-10-25 DIAGNOSIS — M62551 Muscle wasting and atrophy, not elsewhere classified, right thigh: Secondary | ICD-10-CM | POA: Diagnosis not present

## 2016-10-25 NOTE — Progress Notes (Signed)
   Subjective:    Patient ID: Heidi Adams, female    DOB: 1936-07-12, 81 y.o.   MRN: [id number]  HPI  81 year old female presents the office today for concerns of a limb length difference. She states that she has had both knee replaced as well as hip replaced and she believes that one leg is shorter than the other which causes intermittent pain to her legs. She was given a heel lift by orthopedic surgeon and she states this has not been helping and she would've discussed possible orthotics to see if this will help. She denies any pain to her feet denies he swelling or redness. She has no other complaints today.  Review of Systems  All other systems reviewed and are negative.      Objective:   Physical Exam General: AAO x3, NAD  Dermatological: Skin is warm, dry and supple bilateral. Nails x 10 are well manicured; remaining integument appears unremarkable at this time. There are no open sores, no preulcerative lesions, no rash or signs of infection present.  Vascular: Dorsalis Pedis artery and Posterior Tibial artery pedal pulses are 2/4 bilateral with immedate capillary fill time. There is no pain with calf compression, swelling, warmth, erythema.   Neruologic: Grossly intact via light touch bilateral. Vibratory intact via tuning fork bilateral. Protective threshold with Semmes Wienstein monofilament intact to all pedal sites bilateral.   Musculoskeletal: On the right side the leg measures 87.5 cemented and left 86 cm from the umbilicus to the medial malleolus. There is no area of tenderness bilateral lower extremities there is no swelling. Range of motion intact. Upon walking she does have a genu valgus on the right side. Muscular strength 5/5 in all groups tested bilateral.  Gait: Unassisted, Nonantalgic.     Assessment & Plan:  81 year old female with limb length discrepancy -Treatment options discussed including all alternatives, risks, and complications -Etiology of symptoms were  discussed -I discussed options for limited difference. She would like to try something different than the heel lifts as it has been helping. I discussed with her and a comminuted insert with a lift. I discussed doing a custom versus over-the-counter with modifications. I will have to see her back in the [place] office for measurement and possible modifications and over-the-counter insert. We will reappoint her for later this week at her convenience.  Celesta Gentile, DPM

## 2016-10-27 DIAGNOSIS — Z96651 Presence of right artificial knee joint: Secondary | ICD-10-CM | POA: Diagnosis not present

## 2016-10-27 DIAGNOSIS — R2689 Other abnormalities of gait and mobility: Secondary | ICD-10-CM | POA: Diagnosis not present

## 2016-10-27 DIAGNOSIS — M25561 Pain in right knee: Secondary | ICD-10-CM | POA: Diagnosis not present

## 2016-10-27 DIAGNOSIS — M62551 Muscle wasting and atrophy, not elsewhere classified, right thigh: Secondary | ICD-10-CM | POA: Diagnosis not present

## 2016-10-31 ENCOUNTER — Other Ambulatory Visit: Payer: PPO

## 2016-11-01 DIAGNOSIS — Z96651 Presence of right artificial knee joint: Secondary | ICD-10-CM | POA: Diagnosis not present

## 2016-11-01 DIAGNOSIS — R2689 Other abnormalities of gait and mobility: Secondary | ICD-10-CM | POA: Diagnosis not present

## 2016-11-01 DIAGNOSIS — M25561 Pain in right knee: Secondary | ICD-10-CM | POA: Diagnosis not present

## 2016-11-01 DIAGNOSIS — M62551 Muscle wasting and atrophy, not elsewhere classified, right thigh: Secondary | ICD-10-CM | POA: Diagnosis not present

## 2016-11-04 DIAGNOSIS — R2689 Other abnormalities of gait and mobility: Secondary | ICD-10-CM | POA: Diagnosis not present

## 2016-11-04 DIAGNOSIS — Z96651 Presence of right artificial knee joint: Secondary | ICD-10-CM | POA: Diagnosis not present

## 2016-11-04 DIAGNOSIS — M62551 Muscle wasting and atrophy, not elsewhere classified, right thigh: Secondary | ICD-10-CM | POA: Diagnosis not present

## 2016-11-04 DIAGNOSIS — M25561 Pain in right knee: Secondary | ICD-10-CM | POA: Diagnosis not present

## 2016-11-09 DIAGNOSIS — M25561 Pain in right knee: Secondary | ICD-10-CM | POA: Diagnosis not present

## 2016-11-09 DIAGNOSIS — Z96651 Presence of right artificial knee joint: Secondary | ICD-10-CM | POA: Diagnosis not present

## 2016-11-09 DIAGNOSIS — M62551 Muscle wasting and atrophy, not elsewhere classified, right thigh: Secondary | ICD-10-CM | POA: Diagnosis not present

## 2016-11-09 DIAGNOSIS — R2689 Other abnormalities of gait and mobility: Secondary | ICD-10-CM | POA: Diagnosis not present

## 2016-11-11 DIAGNOSIS — R2689 Other abnormalities of gait and mobility: Secondary | ICD-10-CM | POA: Diagnosis not present

## 2016-11-11 DIAGNOSIS — M25561 Pain in right knee: Secondary | ICD-10-CM | POA: Diagnosis not present

## 2016-11-11 DIAGNOSIS — Z96651 Presence of right artificial knee joint: Secondary | ICD-10-CM | POA: Diagnosis not present

## 2016-11-11 DIAGNOSIS — M62551 Muscle wasting and atrophy, not elsewhere classified, right thigh: Secondary | ICD-10-CM | POA: Diagnosis not present

## 2016-11-14 DIAGNOSIS — D649 Anemia, unspecified: Secondary | ICD-10-CM | POA: Diagnosis not present

## 2016-11-14 DIAGNOSIS — R5383 Other fatigue: Secondary | ICD-10-CM | POA: Diagnosis not present

## 2016-11-14 DIAGNOSIS — E038 Other specified hypothyroidism: Secondary | ICD-10-CM | POA: Diagnosis not present

## 2016-11-15 NOTE — Progress Notes (Signed)
Please place orders in EPIC as patient is being scheduled for a pre-op appointment! Thank You! 

## 2016-11-17 DIAGNOSIS — M62551 Muscle wasting and atrophy, not elsewhere classified, right thigh: Secondary | ICD-10-CM | POA: Diagnosis not present

## 2016-11-17 DIAGNOSIS — R2689 Other abnormalities of gait and mobility: Secondary | ICD-10-CM | POA: Diagnosis not present

## 2016-11-17 DIAGNOSIS — Z96651 Presence of right artificial knee joint: Secondary | ICD-10-CM | POA: Diagnosis not present

## 2016-11-17 DIAGNOSIS — M25561 Pain in right knee: Secondary | ICD-10-CM | POA: Diagnosis not present

## 2016-11-21 ENCOUNTER — Ambulatory Visit (HOSPITAL_COMMUNITY)
Admission: RE | Admit: 2016-11-21 | Discharge: 2016-11-21 | Disposition: A | Discharge status: Home / Self Care | Payer: PPO | Source: Ambulatory Visit | Attending: Internal Medicine | Admitting: Internal Medicine

## 2016-11-21 DIAGNOSIS — D649 Anemia, unspecified: Secondary | ICD-10-CM | POA: Insufficient documentation

## 2016-11-21 MED ORDER — SODIUM CHLORIDE 0.9 % IV SOLN
510.0000 mg | Freq: Once | INTRAVENOUS | Status: AC
  Administered 2016-11-21: 510 mg via INTRAVENOUS
  Filled 2016-11-21: qty 17

## 2016-11-21 NOTE — Discharge Instructions (Signed)

## 2016-11-22 DIAGNOSIS — Z96651 Presence of right artificial knee joint: Secondary | ICD-10-CM | POA: Diagnosis not present

## 2016-11-22 DIAGNOSIS — R2689 Other abnormalities of gait and mobility: Secondary | ICD-10-CM | POA: Diagnosis not present

## 2016-11-22 DIAGNOSIS — M25561 Pain in right knee: Secondary | ICD-10-CM | POA: Diagnosis not present

## 2016-11-22 DIAGNOSIS — M62551 Muscle wasting and atrophy, not elsewhere classified, right thigh: Secondary | ICD-10-CM | POA: Diagnosis not present

## 2016-11-23 ENCOUNTER — Other Ambulatory Visit: Payer: PPO

## 2016-11-23 DIAGNOSIS — M1612 Unilateral primary osteoarthritis, left hip: Secondary | ICD-10-CM | POA: Diagnosis not present

## 2016-11-24 NOTE — H&P (Signed)
TOTAL HIP ADMISSION H&P  Patient is admitted for left total hip arthroplasty, anterior approach.  Subjective:  Chief Complaint:     Left hip primary OA / pain  HPI: DIVA LEMBERGER, 81 y.o. female, has a history of pain and functional disability in the left hip(s) due to arthritis and patient has failed non-surgical conservative treatments for greater than 12 weeks to include NSAID's and/or analgesics, corticosteriod injections, use of assistive devices and activity modification.  Onset of symptoms was gradual starting 2+ years ago with gradually worsening course since that time.The patient noted no past surgery on the left hip(s).  Patient currently rates pain in the left hip at 10 out of 10 with activity. Patient has night pain, worsening of pain with activity and weight bearing, trendelenberg gait, pain that interfers with activities of daily living and pain with passive range of motion. Patient has evidence of periarticular osteophytes and joint space narrowing by imaging studies. This condition presents safety issues increasing the risk of falls.  There is no current active infection.  Risks, benefits and expectations were discussed with the patient.  Risks including but not limited to the risk of anesthesia, blood clots, nerve damage, blood vessel damage, failure of the prosthesis, infection and up to and including death.  Patient understand the risks, benefits and expectations and wishes to proceed with surgery.   PCP: Precious Reel, MD  D/C Plans:       Home   Post-op Meds:       No Rx given  Tranexamic Acid:      To be given - IV   Decadron:      Is to be given  FYI:     ASA  Oxycodone (can't tolerate Norco = N&V)  DME:   Pt already has equipment   PT:   No PT    Patient Active Problem List   Diagnosis Date Noted  . S/P total knee replacement, right 08/09/2016  . Overweight (BMI 25.0-29.9) 05/11/2016  . S/P right THA, AA 05/10/2016  . Right hip pain 03/16/2016  . Pes planus  03/16/2016  . Abnormality of gait 03/16/2016  . PAC (premature atrial contraction) 02/24/2016  . PVC (premature ventricular contraction) 02/24/2016  . Palpitations 01/27/2016  . Benign paroxysmal positional vertigo of right ear 12/15/2015  . Pharyngoesophageal dysphagia 12/15/2015  . Hypothyroidism 11/26/2015  . Essential hypertension 11/26/2015  . S/P left TKA 09/28/2015  . Lichen sclerosus 61/44/3154   Past Medical History:  Diagnosis Date  . Arthritis   . Dysrhythmia    went into Atrial Fibrillation after last surgery  . History of skin cancer   . Hypertension   . PVC (premature ventricular contraction)   . Thyroiditis 1973    Past Surgical History:  Procedure Laterality Date  . ABDOMINAL HYSTERECTOMY  89   TAH BSO  . APPENDECTOMY    . ELBOW SURGERY    . EYE SURGERY     "on my eyelids" years ago  . KNEE ARTHROSCOPY     left  . THYROIDECTOMY  1973  . TONSILLECTOMY    . TOTAL HIP ARTHROPLASTY Right 05/10/2016   Procedure: TOTAL HIP ARTHROPLASTY ANTERIOR APPROACH;  Surgeon: Paralee Cancel, MD;  Location: [facility];  Service: Orthopedics;  Laterality: Right;  . TOTAL KNEE ARTHROPLASTY Left 09/28/2015   Procedure: LEFT TOTAL KNEE ARTHROPLASTY;  Surgeon: Paralee Cancel, MD;  Location: [facility];  Service: Orthopedics;  Laterality: Left;  . TOTAL KNEE ARTHROPLASTY Right 08/09/2016   Procedure: RIGHT  TOTAL KNEE ARTHROPLASTY;  Surgeon: Paralee Cancel, MD;  Location: [facility];  Service: Orthopedics;  Laterality: Right;  Adductor Block    No prescriptions prior to admission.   Allergies  Allergen Reactions  . Codeine Nausea Only  . Epinephrine     Tachcardyia, chest pains tremors  . Tenormin [Atenolol] Rash    Social History  Substance Use Topics  . Smoking status: Former Smoker    Quit date: 07/26/1987  . Smokeless tobacco: Never Used  . Alcohol use No    Family History  Problem Relation Age of Onset  . Diabetes Father   . Hypertension Father      Review of Systems   Constitutional: Negative.   HENT: Negative.   Eyes: Negative.   Respiratory: Negative.   Cardiovascular: Negative.   Gastrointestinal: Negative.   Genitourinary: Negative.   Musculoskeletal: Positive for joint pain.  Skin: Negative.   Neurological: Negative.   Endo/Heme/Allergies: Negative.   Psychiatric/Behavioral: Negative.     Objective:  Physical Exam  Constitutional: She is oriented to person, place, and time. She appears well-developed.  HENT:  Head: Normocephalic.  Eyes: Pupils are equal, round, and reactive to light.  Neck: Neck supple. No JVD present. No tracheal deviation present. No thyromegaly present.  Cardiovascular: Normal rate, regular rhythm and intact distal pulses.   Respiratory: Effort normal and breath sounds normal. No respiratory distress. She has no wheezes.  GI: Soft. There is no tenderness. There is no guarding.  Musculoskeletal:       Left hip: She exhibits decreased range of motion, decreased strength, tenderness and bony tenderness. She exhibits no swelling, no deformity and no laceration.  Lymphadenopathy:    She has no cervical adenopathy.  Neurological: She is alert and oriented to person, place, and time.  Skin: Skin is warm and dry.  Psychiatric: She has a normal mood and affect.      Labs:  Estimated body mass index is 25.7 kg/m as calculated from the following:   Height as of 08/09/16: 5\' 4"  (1.626 m).   Weight as of 08/09/16: 67.9 kg (149 lb 11.2 oz).   Imaging Review Plain radiographs demonstrate severe degenerative joint disease of the left hip(s). The bone quality appears to be good for age and reported activity level.  Assessment/Plan:  End stage arthritis, left hip(s)  The patient history, physical examination, clinical judgement of the provider and imaging studies are consistent with end stage degenerative joint disease of the left hip(s) and total hip arthroplasty is deemed medically necessary. The treatment options  including medical management, injection therapy, arthroscopy and arthroplasty were discussed at length. The risks and benefits of total hip arthroplasty were presented and reviewed. The risks due to aseptic loosening, infection, stiffness, dislocation/subluxation,  thromboembolic complications and other imponderables were discussed.  The patient acknowledged the explanation, agreed to proceed with the plan and consent was signed. Patient is being admitted for inpatient treatment for surgery, pain control, PT, OT, prophylactic antibiotics, VTE prophylaxis, progressive ambulation and ADL's and discharge planning.The patient is planning to be discharged home.     West Pugh Keyaria Lawson   PA-C  11/24/2016, 9:42 AM

## 2016-11-25 ENCOUNTER — Encounter (HOSPITAL_COMMUNITY): Payer: PPO

## 2016-11-29 ENCOUNTER — Encounter (HOSPITAL_COMMUNITY): Payer: Self-pay

## 2016-11-30 ENCOUNTER — Encounter (HOSPITAL_COMMUNITY)
Admission: RE | Admit: 2016-11-30 | Discharge: 2016-11-30 | Disposition: A | Discharge status: Home / Self Care | Payer: PPO | Source: Ambulatory Visit | Attending: Orthopedic Surgery | Admitting: Orthopedic Surgery

## 2016-11-30 ENCOUNTER — Encounter (HOSPITAL_COMMUNITY): Payer: Self-pay

## 2016-11-30 DIAGNOSIS — Z01812 Encounter for preprocedural laboratory examination: Secondary | ICD-10-CM | POA: Insufficient documentation

## 2016-11-30 LAB — BASIC METABOLIC PANEL
Anion gap: 10 (ref 5–15)
BUN: 26 mg/dL — AB (ref 6–20)
CO2: 27 mmol/L (ref 22–32)
CREATININE: 0.7 mg/dL (ref 0.44–1.00)
Calcium: 9.1 mg/dL (ref 8.9–10.3)
Chloride: 102 mmol/L (ref 101–111)
GFR calc Af Amer: >60 mL/min (ref >60)
GLUCOSE: 92 mg/dL (ref 65–99)
Potassium: 3.7 mmol/L (ref 3.5–5.1)
SODIUM: 139 mmol/L (ref 135–145)

## 2016-11-30 LAB — SURGICAL PCR SCREEN
MRSA, PCR: NEGATIVE
Staphylococcus aureus: NEGATIVE

## 2016-11-30 LAB — NO BLOOD PRODUCTS

## 2016-11-30 LAB — CBC
HCT: 34.4 % — ABNORMAL LOW (ref 36.0–46.0)
Hemoglobin: 9.9 g/dL — ABNORMAL LOW (ref 12.0–15.0)
MCH: 23.6 pg — AB (ref 26.0–34.0)
MCHC: 28.8 g/dL — AB (ref 30.0–36.0)
MCV: 81.9 fL (ref 78.0–100.0)
PLATELETS: 412 10*3/uL — AB (ref 150–400)
RBC: 4.2 MIL/uL (ref 3.87–5.11)
RDW: 17.4 % — AB (ref 11.5–15.5)
WBC: 9.5 10*3/uL (ref 4.0–10.5)

## 2016-11-30 NOTE — Patient Instructions (Signed)
Heidi Adams  11/30/2016   Your procedure is scheduled on: 12-06-16  Report to [facility] Main  Entrance    Report to admitting at 11:50AM   Call this number if you have problems the morning of surgery  [phone]   Remember: ONLY 1 PERSON MAY GO WITH YOU TO SHORT STAY TO GET  READY MORNING OF Heidi Adams.  Do not eat food  After Midnight. You may have clear liquids from midnight until 8am day of surgery. Nothing by mouth after 8am!!     Take these medicines the morning of surgery with A SIP OF WATER: synthroid, pantoprazole(protonix) as needed, oxycodone as needed, tylenol as needed                                You may not have any metal on your body including hair pins and              piercings  Do not wear jewelry, make-up, lotions, powders or perfumes, deodorant             Do not wear nail polish.  Do not shave  48 hours prior to surgery.     Do not bring valuables to the hospital. Heidi Adams.  Contacts, dentures or bridgework may not be worn into surgery.  Leave suitcase in the car. After surgery it may be brought to your room.               Please read over the following fact sheets you were given: _____________________________________________________________________                CLEAR LIQUID DIET   Foods Allowed                                                                     Foods Excluded  Coffee and tea, regular and decaf                             liquids that you cannot  Plain Jell-O in any flavor                                             see through such as: Fruit ices (not with fruit pulp)                                     milk, soups, orange juice  Iced Popsicles                                    All solid food Carbonated beverages, regular and diet  Cranberry, grape and apple juices Sports drinks like Gatorade Lightly seasoned  clear broth or consume(fat free) Sugar, honey syrup  Sample Menu Breakfast                                Lunch                                     Supper Cranberry juice                    Beef broth                            Chicken broth Jell-O                                     Grape juice                           Apple juice Coffee or tea                        Jell-O                                      Popsicle                                                Coffee or tea                        Coffee or tea  _____________________________________________________________________  [facility] - Preparing for Surgery Before surgery, you can play an important role.  Because skin is not sterile, your skin needs to be as free of germs as possible.  You can reduce the number of germs on your skin by washing with CHG (chlorahexidine gluconate) soap before surgery.  CHG is an antiseptic cleaner which kills germs and bonds with the skin to continue killing germs even after washing. Please DO NOT use if you have an allergy to CHG or antibacterial soaps.  If your skin becomes reddened/irritated stop using the CHG and inform your nurse when you arrive at Short Stay. Do not shave (including legs and underarms) for at least 48 hours prior to the first CHG shower.  You may shave your face/neck. Please follow these instructions carefully:  1.  Shower with CHG Soap the night before surgery and the  morning of Surgery.  2.  If you choose to wash your hair, wash your hair first as usual with your  normal  shampoo.  3.  After you shampoo, rinse your hair and body thoroughly to remove the  shampoo.                           4.  Use CHG as you would any other liquid soap.  You can apply chg directly  to the skin and wash  Gently with a scrungie or clean washcloth.  5.  Apply the CHG Soap to your body ONLY FROM THE NECK DOWN.   Do not use on face/ open                           Wound or  open sores. Avoid contact with eyes, ears mouth and genitals (private parts).                       Wash face,  Genitals (private parts) with your normal soap.             6.  Wash thoroughly, paying special attention to the area where your surgery  will be performed.  7.  Thoroughly rinse your body with warm water from the neck down.  8.  DO NOT shower/wash with your normal soap after using and rinsing off  the CHG Soap.                9.  Pat yourself dry with a clean towel.            10.  Wear clean pajamas.            11.  Place clean sheets on your bed the night of your first shower and do not  sleep with pets. Day of Surgery : Do not apply any lotions/deodorants the morning of surgery.  Please wear clean clothes to the hospital/surgery center.  FAILURE TO FOLLOW THESE INSTRUCTIONS MAY RESULT IN THE CANCELLATION OF YOUR SURGERY PATIENT SIGNATURE_________________________________  NURSE SIGNATURE__________________________________  ________________________________________________________________________   Heidi Adams  An incentive spirometer is a tool that can help keep your lungs clear and active. This tool measures how well you are filling your lungs with each breath. Taking long deep breaths may help reverse or decrease the chance of developing breathing (pulmonary) problems (especially infection) following:  A long period of time when you are unable to move or be active. BEFORE THE PROCEDURE   If the spirometer includes an indicator to show your best effort, your nurse or respiratory therapist will set it to a desired goal.  If possible, sit up straight or lean slightly forward. Try not to slouch.  Hold the incentive spirometer in an upright position. INSTRUCTIONS FOR USE  1. Sit on the edge of your bed if possible, or sit up as far as you can in bed or on a chair. 2. Hold the incentive spirometer in an upright position. 3. Breathe out normally. 4. Place the  mouthpiece in your mouth and seal your lips tightly around it. 5. Breathe in slowly and as deeply as possible, raising the piston or the ball toward the top of the column. 6. Hold your breath for 3-5 seconds or for as long as possible. Allow the piston or ball to fall to the bottom of the column. 7. Remove the mouthpiece from your mouth and breathe out normally. 8. Rest for a few seconds and repeat Steps 1 through 7 at least 10 times every 1-2 hours when you are awake. Take your time and take a few normal breaths between deep breaths. 9. The spirometer may include an indicator to show your best effort. Use the indicator as a goal to work toward during each repetition. 10. After each set of 10 deep breaths, practice coughing to be sure your lungs are clear. If you have an incision (the cut made at the time of  surgery), support your incision when coughing by placing a pillow or rolled up towels firmly against it. Once you are able to get out of bed, walk around indoors and cough well. You may stop using the incentive spirometer when instructed by your caregiver.  RISKS AND COMPLICATIONS  Take your time so you do not get dizzy or light-headed.  If you are in pain, you may need to take or ask for pain medication before doing incentive spirometry. It is harder to take a deep breath if you are having pain. AFTER USE  Rest and breathe slowly and easily.  It can be helpful to keep track of a log of your progress. Your caregiver can provide you with a simple table to help with this. If you are using the spirometer at home, follow these instructions: [place] IF:   You are having difficultly using the spirometer.  You have trouble using the spirometer as often as instructed.  Your pain medication is not giving enough relief while using the spirometer.  You develop fever of 100.5 F (38.1 C) or higher. SEEK IMMEDIATE MEDICAL CARE IF:   You cough up bloody sputum that had not been present  before.  You develop fever of 102 F (38.9 C) or greater.  You develop worsening pain at or near the incision site. MAKE SURE YOU:   Understand these instructions.  Will watch your condition.  Will get help right away if you are not doing well or get worse. Document Released: 11/21/2006 Document Revised: 10/03/2011 Document Reviewed: 01/22/2007 Exit[employer] Patient Information 2014 [employer], [place].   ________________________________________________________________________  WHAT IS A BLOOD TRANSFUSION? Blood Transfusion Information  A transfusion is the replacement of blood or some of its parts. Blood is made up of multiple cells which provide different functions.  Red blood cells carry oxygen and are used for blood loss replacement.  White blood cells fight against infection.  Platelets control bleeding.  Plasma helps clot blood.  Other blood products are available for specialized needs, such as hemophilia or other clotting disorders. BEFORE THE TRANSFUSION  Who gives blood for transfusions?   Healthy volunteers who are fully evaluated to make sure their blood is safe. This is blood bank blood. Transfusion therapy is the safest it has ever been in the practice of medicine. Before blood is taken from a donor, a complete history is taken to make sure that person has no history of diseases nor engages in risky social behavior (examples are intravenous drug use or sexual activity with multiple partners). The donor's travel history is screened to minimize risk of transmitting infections, such as malaria. The donated blood is tested for signs of infectious diseases, such as HIV and hepatitis. The blood is then tested to be sure it is compatible with you in order to minimize the chance of a transfusion reaction. If you or a relative donates blood, this is often done in anticipation of surgery and is not appropriate for emergency situations. It takes many days to process the donated  blood. RISKS AND COMPLICATIONS Although transfusion therapy is very safe and saves many lives, the main dangers of transfusion include:   Getting an infectious disease.  Developing a transfusion reaction. This is an allergic reaction to something in the blood you were given. Every precaution is taken to prevent this. The decision to have a blood transfusion has been considered carefully by your caregiver before blood is given. Blood is not given unless the benefits outweigh the risks. AFTER THE  TRANSFUSION  Right after receiving a blood transfusion, you will usually feel much better and more energetic. This is especially true if your red blood cells have gotten low (anemic). The transfusion raises the level of the red blood cells which carry oxygen, and this usually causes an energy increase.  The nurse administering the transfusion will monitor you carefully for complications. HOME CARE INSTRUCTIONS  No special instructions are needed after a transfusion. You may find your energy is better. Speak with your caregiver about any limitations on activity for underlying diseases you may have. SEEK MEDICAL CARE IF:   Your condition is not improving after your transfusion.  You develop redness or irritation at the intravenous (IV) site. SEEK IMMEDIATE MEDICAL CARE IF:  Any of the following symptoms occur over the next 12 hours:  Shaking chills.  You have a temperature by mouth above 102 F (38.9 C), not controlled by medicine.  Chest, back, or muscle pain.  People around you feel you are not acting correctly or are confused.  Shortness of breath or difficulty breathing.  Dizziness and fainting.  You get a rash or develop hives.  You have a decrease in urine output.  Your urine turns a dark color or changes to pink, red, or brown. Any of the following symptoms occur over the next 10 days:  You have a temperature by mouth above 102 F (38.9 C), not controlled by  medicine.  Shortness of breath.  Weakness after normal activity.  The white part of the eye turns yellow (jaundice).  You have a decrease in the amount of urine or are urinating less often.  Your urine turns a dark color or changes to pink, red, or brown. Document Released: 07/08/2000 Document Revised: 10/03/2011 Document Reviewed: 02/25/2008 [facility] Patient Information 2014 [place], [place].  _______________________________________________________________________

## 2016-11-30 NOTE — Progress Notes (Signed)
Clearance Dr Virgina Jock on chart LOV cardio Dr Alphonzo Cruise 09-16-16 epic ECHO 11-27-15 epic

## 2016-12-01 NOTE — Progress Notes (Signed)
[facility]bc results routed via epic to dr Alvan Dame

## 2016-12-01 NOTE — Progress Notes (Signed)
RN faxed request to medical records  [facility] requesting most recent EKG with tracing

## 2016-12-01 NOTE — Progress Notes (Signed)
Blood refusal document completed  Copy of blood refusal faxed to MD office and blood bank; copy in chart  Refusal documented in MD orders  Noted in FYI  Added to allergies

## 2016-12-02 NOTE — Progress Notes (Signed)
Spoke with patient and instructed her to arrive at Admitting on 12/06/2016 at 0730 am to check in and register and to have Nothing to eat or drink after midnight ! Patient verbalized instructions.

## 2016-12-06 ENCOUNTER — Encounter (HOSPITAL_COMMUNITY): Admission: RE | Payer: Self-pay | Source: Ambulatory Visit

## 2016-12-06 ENCOUNTER — Inpatient Hospital Stay (HOSPITAL_COMMUNITY): Admission: RE | Admit: 2016-12-06 | Payer: PPO | Source: Ambulatory Visit | Admitting: Orthopedic Surgery

## 2016-12-06 SURGERY — ARTHROPLASTY, HIP, TOTAL, ANTERIOR APPROACH
Anesthesia: Spinal | Site: Hip | Laterality: Left

## 2016-12-07 ENCOUNTER — Other Ambulatory Visit (HOSPITAL_COMMUNITY): Payer: Self-pay | Admitting: *Deleted

## 2016-12-08 ENCOUNTER — Ambulatory Visit (HOSPITAL_COMMUNITY)
Admission: RE | Admit: 2016-12-08 | Discharge: 2016-12-08 | Disposition: A | Discharge status: Home / Self Care | Payer: PPO | Source: Ambulatory Visit | Attending: Internal Medicine | Admitting: Internal Medicine

## 2016-12-08 DIAGNOSIS — D649 Anemia, unspecified: Secondary | ICD-10-CM | POA: Insufficient documentation

## 2016-12-08 MED ORDER — SODIUM CHLORIDE 0.9 % IV SOLN
510.0000 mg | Freq: Once | INTRAVENOUS | Status: AC
  Administered 2016-12-08: 510 mg via INTRAVENOUS
  Filled 2016-12-08: qty 17

## 2016-12-09 ENCOUNTER — Other Ambulatory Visit (HOSPITAL_BASED_OUTPATIENT_CLINIC_OR_DEPARTMENT_OTHER): Payer: Self-pay | Admitting: Internal Medicine

## 2016-12-09 DIAGNOSIS — Z1231 Encounter for screening mammogram for malignant neoplasm of breast: Secondary | ICD-10-CM

## 2016-12-12 ENCOUNTER — Other Ambulatory Visit: Payer: Self-pay | Admitting: Internal Medicine

## 2016-12-12 ENCOUNTER — Encounter: Payer: Self-pay | Admitting: Women's Health

## 2016-12-12 ENCOUNTER — Ambulatory Visit (INDEPENDENT_AMBULATORY_CARE_PROVIDER_SITE_OTHER): Payer: PPO | Admitting: Women's Health

## 2016-12-12 VITALS — BP 120/86

## 2016-12-12 DIAGNOSIS — Z1231 Encounter for screening mammogram for malignant neoplasm of breast: Secondary | ICD-10-CM

## 2016-12-12 DIAGNOSIS — N949 Unspecified condition associated with female genital organs and menstrual cycle: Secondary | ICD-10-CM

## 2016-12-12 DIAGNOSIS — B373 Candidiasis of vulva and vagina: Secondary | ICD-10-CM | POA: Diagnosis not present

## 2016-12-12 DIAGNOSIS — B3731 Acute candidiasis of vulva and vagina: Secondary | ICD-10-CM

## 2016-12-12 DIAGNOSIS — L900 Lichen sclerosus et atrophicus: Secondary | ICD-10-CM | POA: Diagnosis not present

## 2016-12-12 LAB — WET PREP FOR TRICH, YEAST, CLUE
Clue Cells Wet Prep HPF POC: NONE SEEN
TRICH WET PREP: NONE SEEN
WBC, Wet Prep HPF POC: NONE SEEN

## 2016-12-12 MED ORDER — CLOBETASOL PROPIONATE 0.05 % EX OINT: 1.0000 "application " | TOPICAL_OINTMENT | Freq: Two times a day (BID) | CUTANEOUS | 0 refills | Status: DC

## 2016-12-12 MED ORDER — FLUCONAZOLE 150 MG PO TABS: 150.0000 mg | ORAL_TABLET | Freq: Once | ORAL | 1 refills | Status: AC

## 2016-12-12 NOTE — Progress Notes (Signed)
Presents with complaint of vaginal irritation and a topical skin burning sensation in the left groin area for the past 2 weeks. States feels like left groin area draining clear fluid. Has been using nystatin and steroid cream from primary care with minimal relief. History of lichen sclerosis. Denies urinary symptoms, leakage of urine or fever. TAH with BSO on no HRT.Marland Kitchen Hypertension, hypothyroidism and arthritis. Walks with a cane. Scheduled for hip replacement surgery next month.  Exam: Appears uncomfortable, slow moving. External genitalia lichen sclerosis at posterior fourchette and left labia. No visible skin irritation, erythema, drainage, to the left groin area. Wet prep done with a Q-tip positive for yeast.  Yeast vaginitis Lichen sclerosis  Plan: Diflucan 150 by mouth today repeat in 3 days if needed. Temovate 0.05% ointment twice daily to external genitalia posterior fourchette and left labia. Instructed to apply small amount to the left groin area. Instructed to call if no relief. Encouraged loose clothing, open to air when able.

## 2016-12-12 NOTE — Patient Instructions (Signed)
Lichen Sclerosus Lichen sclerosus is a skin problem. It can happen on any part of the body. It happens most often in the anal or genital areas. It can cause itching and discomfort. Treatment can help to control symptoms. This skin problem is not passed from one person to another (not contagious). The cause is not known. Follow these instructions at home:  Take over-the-counter and prescription medicines only as told by your doctor.  Use creams or ointments as told by your doctor.  Do not scratch the affected areas of skin.  Women should keep the vagina as clean and dry as they can.  Keep all follow-up visits as told by your doctor. This is important. Contact a doctor if:  Your redness, swelling, or pain gets worse.  You have fluid, blood, or pus coming from the area.  You have new patches (lesions) on your skin.  You have a fever.  You have pain during sex. This information is not intended to replace advice given to you by your health care provider. Make sure you discuss any questions you have with your health care provider. Document Released: 06/23/2008 Document Revised: 12/17/2015 Document Reviewed: 10/06/2014 Elsevier Interactive Patient Education  2017 [employer] Vaginal Yeast infection, Adult Vaginal yeast infection is a condition that causes soreness, swelling, and redness (inflammation) of the vagina. It also causes vaginal discharge. This is a common condition. Some women get this infection frequently. What are the causes? This condition is caused by a change in the normal balance of the yeast (candida) and bacteria that live in the vagina. This change causes an overgrowth of yeast, which causes the inflammation. What increases the risk? This condition is more likely to develop in:  Women who take antibiotic medicines.  Women who have diabetes.  Women who take birth control pills.  Women who are pregnant.  Women who douche often.  Women who have a weak defense  (immune) system.  Women who have been taking steroid medicines for a long time.  Women who frequently wear tight clothing. What are the signs or symptoms? Symptoms of this condition include:  White, thick vaginal discharge.  Swelling, itching, redness, and irritation of the vagina. The lips of the vagina (vulva) may be affected as well.  Pain or a burning feeling while urinating.  Pain during sex. How is this diagnosed? This condition is diagnosed with a medical history and physical exam. This will include a pelvic exam. Your health care provider will examine a sample of your vaginal discharge under a microscope. Your health care provider may send this sample for testing to confirm the diagnosis. How is this treated? This condition is treated with medicine. Medicines may be over-the-counter or prescription. You may be told to use one or more of the following:  Medicine that is taken orally.  Medicine that is applied as a cream.  Medicine that is inserted directly into the vagina (suppository). Follow these instructions at home:  Take or apply over-the-counter and prescription medicines only as told by your health care provider.  Do not have sex until your health care provider has approved. Tell your sex partner that you have a yeast infection. That person should go to his or her health care provider if he or she develops symptoms.  Do not wear tight clothes, such as pantyhose or tight pants.  Avoid using tampons until your health care provider approves.  Eat more yogurt. This may help to keep your yeast infection from returning.  Try taking a  sitz bath to help with discomfort. This is a warm water bath that is taken while you are sitting down. The water should only come up to your hips and should cover your buttocks. Do this 3-4 times per day or as told by your health care provider.  Do not douche.  Wear breathable, cotton underwear.  If you have diabetes, keep your blood  sugar levels under control. Contact a health care provider if:  You have a fever.  Your symptoms go away and then return.  Your symptoms do not get better with treatment.  Your symptoms get worse.  You have new symptoms.  You develop blisters in or around your vagina.  You have blood coming from your vagina and it is not your menstrual period.  You develop pain in your abdomen. This information is not intended to replace advice given to you by your health care provider. Make sure you discuss any questions you have with your health care provider. Document Released: 04/20/2005 Document Revised: 12/23/2015 Document Reviewed: 01/12/2015 Elsevier Interactive Patient Education  2017 [employer].

## 2016-12-15 ENCOUNTER — Ambulatory Visit (HOSPITAL_BASED_OUTPATIENT_CLINIC_OR_DEPARTMENT_OTHER)
Admission: RE | Admit: 2016-12-15 | Discharge: 2016-12-15 | Disposition: A | Discharge status: Home / Self Care | Payer: PPO | Source: Ambulatory Visit | Attending: Internal Medicine | Admitting: Internal Medicine

## 2016-12-15 DIAGNOSIS — D6489 Other specified anemias: Secondary | ICD-10-CM | POA: Diagnosis not present

## 2016-12-15 DIAGNOSIS — Z1231 Encounter for screening mammogram for malignant neoplasm of breast: Secondary | ICD-10-CM | POA: Diagnosis not present

## 2016-12-15 DIAGNOSIS — D649 Anemia, unspecified: Secondary | ICD-10-CM | POA: Diagnosis not present

## 2016-12-20 ENCOUNTER — Other Ambulatory Visit: Payer: Self-pay | Admitting: Internal Medicine

## 2016-12-20 DIAGNOSIS — R928 Other abnormal and inconclusive findings on diagnostic imaging of breast: Secondary | ICD-10-CM

## 2016-12-23 DIAGNOSIS — D649 Anemia, unspecified: Secondary | ICD-10-CM | POA: Diagnosis not present

## 2016-12-27 ENCOUNTER — Other Ambulatory Visit: Payer: PPO

## 2016-12-28 ENCOUNTER — Encounter: Payer: PPO | Admitting: Gynecology

## 2016-12-29 ENCOUNTER — Other Ambulatory Visit (HOSPITAL_COMMUNITY): Payer: Self-pay | Admitting: *Deleted

## 2016-12-30 ENCOUNTER — Ambulatory Visit (HOSPITAL_COMMUNITY)
Admission: RE | Admit: 2016-12-30 | Discharge: 2016-12-30 | Disposition: A | Discharge status: Home / Self Care | Payer: PPO | Source: Ambulatory Visit | Attending: Internal Medicine | Admitting: Internal Medicine

## 2016-12-30 DIAGNOSIS — D649 Anemia, unspecified: Secondary | ICD-10-CM | POA: Diagnosis not present

## 2016-12-30 MED ORDER — SODIUM CHLORIDE 0.9 % IV SOLN
510.0000 mg | Freq: Once | INTRAVENOUS | Status: AC
  Administered 2016-12-30: 510 mg via INTRAVENOUS
  Filled 2016-12-30: qty 17

## 2016-12-30 NOTE — Progress Notes (Signed)
Surgery on 6/19.  Preop on 6/13.  Need orders in epic

## 2017-01-02 NOTE — Patient Instructions (Addendum)
Heidi Adams  01/02/2017   Your procedure is scheduled on: 01/10/17  Report to [facility] Main  Entrance Take [place]  elevators to 3rd floor to  [place] at     [phone] AM.     Call this number if you have problems the morning of surgery [phone]    Remember: ONLY 1 PERSON MAY GO WITH YOU TO SHORT STAY TO GET  READY MORNING OF [place].  Do not eat food or drink liquids :After Midnight.     Take these medicines the morning of surgery with A SIP OF WATER: pantoprazole, levothyroxine, oxycodone if needed                                You may not have any metal on your body including hair pins and              piercings  Do not wear jewelry, make-up, lotions, powders or perfumes, deodorant             Do not wear nail polish.  Do not shave  48 hours prior to surgery.            Do not bring valuables to the hospital. [place].  Contacts, dentures or bridgework may not be worn into surgery.  Leave suitcase in the car. After surgery it may be brought to your room.                  Please read over the following fact sheets you were given: _____________________________________________________________________             [facility] - Preparing for Surgery Before surgery, you can play an important role.  Because skin is not sterile, your skin needs to be as free of germs as possible.  You can reduce the number of germs on your skin by washing with CHG (chlorahexidine gluconate) soap before surgery.  CHG is an antiseptic cleaner which kills germs and bonds with the skin to continue killing germs even after washing. Please DO NOT use if you have an allergy to CHG or antibacterial soaps.  If your skin becomes reddened/irritated stop using the CHG and inform your nurse when you arrive at Short Stay. Do not shave (including legs and underarms) for at least 48 hours prior to the first CHG shower.  You  may shave your face/neck. Please follow these instructions carefully:  1.  Shower with CHG Soap the night before surgery and the  morning of Surgery.  2.  If you choose to wash your hair, wash your hair first as usual with your  normal  shampoo.  3.  After you shampoo, rinse your hair and body thoroughly to remove the  shampoo.                           4.  Use CHG as you would any other liquid soap.  You can apply chg directly  to the skin and wash                       Gently with a scrungie or clean washcloth.  5.  Apply the  CHG Soap to your body ONLY FROM THE NECK DOWN.   Do not use on face/ open                           Wound or open sores. Avoid contact with eyes, ears mouth and genitals (private parts).                       Wash face,  Genitals (private parts) with your normal soap.             6.  Wash thoroughly, paying special attention to the area where your surgery  will be performed.  7.  Thoroughly rinse your body with warm water from the neck down.  8.  DO NOT shower/wash with your normal soap after using and rinsing off  the CHG Soap.                9.  Pat yourself dry with a clean towel.            10.  Wear clean pajamas.            11.  Place clean sheets on your bed the night of your first shower and do not  sleep with pets. Day of Surgery : Do not apply any lotions/deodorants the morning of surgery.  Please wear clean clothes to the hospital/surgery center.  FAILURE TO FOLLOW THESE INSTRUCTIONS MAY RESULT IN THE CANCELLATION OF YOUR SURGERY PATIENT SIGNATURE_________________________________  NURSE SIGNATURE__________________________________  ________________________________________________________________________  WHAT IS A BLOOD TRANSFUSION? Blood Transfusion Information  A transfusion is the replacement of blood or some of its parts. Blood is made up of multiple cells which provide different functions.  Red blood cells carry oxygen and are used for blood loss  replacement.  White blood cells fight against infection.  Platelets control bleeding.  Plasma helps clot blood.  Other blood products are available for specialized needs, such as hemophilia or other clotting disorders. BEFORE THE TRANSFUSION  Who gives blood for transfusions?   Healthy volunteers who are fully evaluated to make sure their blood is safe. This is blood bank blood. Transfusion therapy is the safest it has ever been in the practice of medicine. Before blood is taken from a donor, a complete history is taken to make sure that person has no history of diseases nor engages in risky social behavior (examples are intravenous drug use or sexual activity with multiple partners). The donor's travel history is screened to minimize risk of transmitting infections, such as malaria. The donated blood is tested for signs of infectious diseases, such as HIV and hepatitis. The blood is then tested to be sure it is compatible with you in order to minimize the chance of a transfusion reaction. If you or a relative donates blood, this is often done in anticipation of surgery and is not appropriate for emergency situations. It takes many days to process the donated blood. RISKS AND COMPLICATIONS Although transfusion therapy is very safe and saves many lives, the main dangers of transfusion include:   Getting an infectious disease.  Developing a transfusion reaction. This is an allergic reaction to something in the blood you were given. Every precaution is taken to prevent this. The decision to have a blood transfusion has been considered carefully by your caregiver before blood is given. Blood is not given unless the benefits outweigh the risks. AFTER THE TRANSFUSION  Right after receiving a blood transfusion,  you will usually feel much better and more energetic. This is especially true if your red blood cells have gotten low (anemic). The transfusion raises the level of the red blood cells which  carry oxygen, and this usually causes an energy increase.  The nurse administering the transfusion will monitor you carefully for complications. HOME CARE INSTRUCTIONS  No special instructions are needed after a transfusion. You may find your energy is better. Speak with your caregiver about any limitations on activity for underlying diseases you may have. SEEK MEDICAL CARE IF:   Your condition is not improving after your transfusion.  You develop redness or irritation at the intravenous (IV) site. SEEK IMMEDIATE MEDICAL CARE IF:  Any of the following symptoms occur over the next 12 hours:  Shaking chills.  You have a temperature by mouth above 102 F (38.9 C), not controlled by medicine.  Chest, back, or muscle pain.  People around you feel you are not acting correctly or are confused.  Shortness of breath or difficulty breathing.  Dizziness and fainting.  You get a rash or develop hives.  You have a decrease in urine output.  Your urine turns a dark color or changes to pink, red, or brown. Any of the following symptoms occur over the next 10 days:  You have a temperature by mouth above 102 F (38.9 C), not controlled by medicine.  Shortness of breath.  Weakness after normal activity.  The white part of the eye turns yellow (jaundice).  You have a decrease in the amount of urine or are urinating less often.  Your urine turns a dark color or changes to pink, red, or brown. Document Released: 07/08/2000 Document Revised: 10/03/2011 Document Reviewed: 02/25/2008 ExitCare Patient Information 2014 [place].  _______________________________________________________________________  Incentive Spirometer  An incentive spirometer is a tool that can help keep your lungs clear and active. This tool measures how well you are filling your lungs with each breath. Taking long deep breaths may help reverse or decrease the chance of developing breathing (pulmonary) problems  (especially infection) following:  A long period of time when you are unable to move or be active. BEFORE THE PROCEDURE   If the spirometer includes an indicator to show your best effort, your nurse or respiratory therapist will set it to a desired goal.  If possible, sit up straight or lean slightly forward. Try not to slouch.  Hold the incentive spirometer in an upright position. INSTRUCTIONS FOR USE  1. Sit on the edge of your bed if possible, or sit up as far as you can in bed or on a chair. 2. Hold the incentive spirometer in an upright position. 3. Breathe out normally. 4. Place the mouthpiece in your mouth and seal your lips tightly around it. 5. Breathe in slowly and as deeply as possible, raising the piston or the ball toward the top of the column. 6. Hold your breath for 3-5 seconds or for as long as possible. Allow the piston or ball to fall to the bottom of the column. 7. Remove the mouthpiece from your mouth and breathe out normally. 8. Rest for a few seconds and repeat Steps 1 through 7 at least 10 times every 1-2 hours when you are awake. Take your time and take a few normal breaths between deep breaths. 9. The spirometer may include an indicator to show your best effort. Use the indicator as a goal to work toward during each repetition. 10. After each set of 10 deep breaths,  practice coughing to be sure your lungs are clear. If you have an incision (the cut made at the time of surgery), support your incision when coughing by placing a pillow or rolled up towels firmly against it. Once you are able to get out of bed, walk around indoors and cough well. You may stop using the incentive spirometer when instructed by your caregiver.  RISKS AND COMPLICATIONS  Take your time so you do not get dizzy or light-headed.  If you are in pain, you may need to take or ask for pain medication before doing incentive spirometry. It is harder to take a deep breath if you are having  pain. AFTER USE  Rest and breathe slowly and easily.  It can be helpful to keep track of a log of your progress. Your caregiver can provide you with a simple table to help with this. If you are using the spirometer at home, follow these instructions: [place] IF:   You are having difficultly using the spirometer.  You have trouble using the spirometer as often as instructed.  Your pain medication is not giving enough relief while using the spirometer.  You develop fever of 100.5 F (38.1 C) or higher. SEEK IMMEDIATE MEDICAL CARE IF:   You cough up bloody sputum that had not been present before.  You develop fever of 102 F (38.9 C) or greater.  You develop worsening pain at or near the incision site. MAKE SURE YOU:   Understand these instructions.  Will watch your condition.  Will get help right away if you are not doing well or get worse. Document Released: 11/21/2006 Document Revised: 10/03/2011 Document Reviewed: 01/22/2007 [facility] Patient Information 2014 [place], [place].   ________________________________________________________________________

## 2017-01-03 ENCOUNTER — Encounter (HOSPITAL_COMMUNITY): Payer: PPO

## 2017-01-04 ENCOUNTER — Encounter (HOSPITAL_COMMUNITY)
Admission: RE | Admit: 2017-01-04 | Discharge: 2017-01-04 | Disposition: A | Discharge status: Home / Self Care | Payer: PPO | Source: Ambulatory Visit | Attending: Orthopedic Surgery | Admitting: Orthopedic Surgery

## 2017-01-04 ENCOUNTER — Encounter (HOSPITAL_COMMUNITY): Payer: Self-pay

## 2017-01-04 DIAGNOSIS — Z01818 Encounter for other preprocedural examination: Secondary | ICD-10-CM | POA: Diagnosis not present

## 2017-01-04 DIAGNOSIS — M1612 Unilateral primary osteoarthritis, left hip: Secondary | ICD-10-CM | POA: Diagnosis not present

## 2017-01-04 HISTORY — DX: Malignant (primary) neoplasm, unspecified: C80.1

## 2017-01-04 HISTORY — DX: Anemia, unspecified: D64.9

## 2017-01-04 LAB — CBC
HEMATOCRIT: 34.7 % — AB (ref 36.0–46.0)
HEMOGLOBIN: 10.5 g/dL — AB (ref 12.0–15.0)
MCH: 25.5 pg — ABNORMAL LOW (ref 26.0–34.0)
MCHC: 30.3 g/dL (ref 30.0–36.0)
MCV: 84.2 fL (ref 78.0–100.0)
Platelets: 291 10*3/uL (ref 150–400)
RBC: 4.12 MIL/uL (ref 3.87–5.11)
RDW: 19.4 % — ABNORMAL HIGH (ref 11.5–15.5)
WBC: 8.6 10*3/uL (ref 4.0–10.5)

## 2017-01-04 LAB — BASIC METABOLIC PANEL
ANION GAP: 9 (ref 5–15)
BUN: 22 mg/dL — ABNORMAL HIGH (ref 6–20)
CHLORIDE: 106 mmol/L (ref 101–111)
CO2: 29 mmol/L (ref 22–32)
CREATININE: 0.73 mg/dL (ref 0.44–1.00)
Calcium: 9.2 mg/dL (ref 8.9–10.3)
GFR calc Af Amer: >60 mL/min (ref >60)
GFR calc non Af Amer: >60 mL/min (ref >60)
Glucose, Bld: 94 mg/dL (ref 65–99)
POTASSIUM: 3.5 mmol/L (ref 3.5–5.1)
SODIUM: 144 mmol/L (ref 135–145)

## 2017-01-04 LAB — SURGICAL PCR SCREEN
MRSA, PCR: NEGATIVE
STAPHYLOCOCCUS AUREUS: NEGATIVE

## 2017-01-04 NOTE — Progress Notes (Signed)
Clearance Dr. [name] on chart ekg 08/28/16 on chart

## 2017-01-06 DIAGNOSIS — R300 Dysuria: Secondary | ICD-10-CM | POA: Diagnosis not present

## 2017-01-06 DIAGNOSIS — D649 Anemia, unspecified: Secondary | ICD-10-CM | POA: Diagnosis not present

## 2017-01-06 DIAGNOSIS — R358 Other polyuria: Secondary | ICD-10-CM | POA: Diagnosis not present

## 2017-01-09 NOTE — H&P (Signed)
TOTAL HIP ADMISSION H&P  Patient is admitted for left total hip arthroplasty, anterior approach.  Subjective:  Chief Complaint:   Left hip primary OA / pain  HPI: Heidi Adams, 81 y.o. female, has a history of pain and functional disability in the left hip(s) due to arthritis and patient has failed non-surgical conservative treatments for greater than 12 weeks to include NSAID's and/or analgesics, corticosteriod injections, use of assistive devices and activity modification.  Onset of symptoms was gradual starting 2+ years ago with gradually worsening course since that time.The patient noted no past surgery on the left hip(s).  Patient currently rates pain in the left hip at 10 out of 10 with activity. Patient has night pain, worsening of pain with activity and weight bearing, trendelenberg gait, pain that interfers with activities of daily living and pain with passive range of motion. Patient has evidence of periarticular osteophytes and joint space narrowing by imaging studies. This condition presents safety issues increasing the risk of falls.   There is no current active infection.   Risks, benefits and expectations were discussed with the patient.  Risks including but not limited to the risk of anesthesia, blood clots, nerve damage, blood vessel damage, failure of the prosthesis, infection and up to and including death.  Patient understand the risks, benefits and expectations and wishes to proceed with surgery.   PCP: [name], MD  D/C Plans:       Home  Post-op Meds:       No Rx given  Tranexamic Acid:      To be given - IV  topically  Decadron:      Is to be given  FYI:     ASA  Oxycodone  (unable to tolerate Norco = N&V)  DME:   Pt already has equipment  PT:   No PT    Patient Active Problem List   Diagnosis Date Noted  . S/P total knee replacement, right 08/09/2016  . Overweight (BMI 25.0-29.9) 05/11/2016  . S/P right THA, AA 05/10/2016  . Right hip pain 03/16/2016  .  Pes planus 03/16/2016  . Abnormality of gait 03/16/2016  . PAC (premature atrial contraction) 02/24/2016  . PVC (premature ventricular contraction) 02/24/2016  . Palpitations 01/27/2016  . Benign paroxysmal positional vertigo of right ear 12/15/2015  . Pharyngoesophageal dysphagia 12/15/2015  . Hypothyroidism 11/26/2015  . Essential hypertension 11/26/2015  . S/P left TKA 09/28/2015  . Lichen sclerosus 24/58/0998   Past Medical History:  Diagnosis Date  . Anemia    hx of anemia  . Arthritis   . Cancer ([place])    skin ca nose  . Dysrhythmia    went into Atrial Fibrillation after last surgery  . History of skin cancer   . Hypertension   . PVC (premature ventricular contraction)   . Thyroiditis 1973    Past Surgical History:  Procedure Laterality Date  . ABDOMINAL HYSTERECTOMY  89   TAH BSO  . APPENDECTOMY    . CHOLECYSTECTOMY     dr Georgia Lopes  . ELBOW SURGERY    . EYE SURGERY     "on my eyelids" years ago  . KNEE ARTHROSCOPY     left  . THYROIDECTOMY  1973  . TONSILLECTOMY    . TOTAL HIP ARTHROPLASTY Right 05/10/2016   Procedure: TOTAL HIP ARTHROPLASTY ANTERIOR APPROACH;  Surgeon: Paralee Cancel, MD;  Location: [facility];  Service: Orthopedics;  Laterality: Right;  . TOTAL KNEE ARTHROPLASTY Left 09/28/2015   Procedure: LEFT  TOTAL KNEE ARTHROPLASTY;  Surgeon: Paralee Cancel, MD;  Location: [facility];  Service: Orthopedics;  Laterality: Left;  . TOTAL KNEE ARTHROPLASTY Right 08/09/2016   Procedure: RIGHT TOTAL KNEE ARTHROPLASTY;  Surgeon: Paralee Cancel, MD;  Location: [facility];  Service: Orthopedics;  Laterality: Right;  Adductor Block  . Total Left hip arthroplasty     01/10/17 Dr. Alvan Dame    No prescriptions prior to admission.   Allergies  Allergen Reactions  . Codeine Nausea Only  . Epinephrine     Tachcardyia, chest pains tremors  . Tenormin [Atenolol] Rash    Social History  Substance Use Topics  . Smoking status: Former Smoker    Quit date: 07/26/1987  . Smokeless tobacco: Never  Used  . Alcohol use No    Family History  Problem Relation Age of Onset  . Diabetes Father   . Hypertension Father      Review of Systems  Constitutional: Negative.   HENT: Negative.   Eyes: Negative.   Respiratory: Negative.   Cardiovascular: Negative.   Gastrointestinal: Negative.   Genitourinary: Negative.   Musculoskeletal: Positive for joint pain.  Skin: Negative.   Neurological: Negative.   Endo/Heme/Allergies: Negative.   Psychiatric/Behavioral: Negative.     Objective:  Physical Exam  Constitutional: She is oriented to person, place, and time. She appears well-developed.  HENT:  Head: Normocephalic.  Eyes: Pupils are equal, round, and reactive to light.  Neck: Neck supple. No JVD present. No tracheal deviation present. No thyromegaly present.  Cardiovascular: Normal rate, regular rhythm and intact distal pulses.   Respiratory: Effort normal and breath sounds normal. No respiratory distress. She has no wheezes.  GI: Soft. There is no tenderness. There is no guarding.  Musculoskeletal:       Left hip: She exhibits decreased range of motion, decreased strength, tenderness and bony tenderness. She exhibits no swelling, no deformity and no laceration.  Lymphadenopathy:    She has no cervical adenopathy.  Neurological: She is alert and oriented to person, place, and time.  Skin: Skin is warm and dry.  Psychiatric: She has a normal mood and affect.      Labs:  Estimated body mass index is 24.62 kg/m as calculated from the following:   Height as of 01/04/17: 5\' 3"  (1.6 m).   Weight as of 01/04/17: 63 kg (139 lb).   Imaging Review Plain radiographs demonstrate severe degenerative joint disease of the left hip(s). The bone quality appears to be good for age and reported activity level.  Assessment/Plan:  End stage arthritis, left hip(s)  The patient history, physical examination, clinical judgement of the provider and imaging studies are consistent with end  stage degenerative joint disease of the left hip(s) and total hip arthroplasty is deemed medically necessary. The treatment options including medical management, injection therapy, arthroscopy and arthroplasty were discussed at length. The risks and benefits of total hip arthroplasty were presented and reviewed. The risks due to aseptic loosening, infection, stiffness, dislocation/subluxation,  thromboembolic complications and other imponderables were discussed.  The patient acknowledged the explanation, agreed to proceed with the plan and consent was signed. Patient is being admitted for inpatient treatment for surgery, pain control, PT, OT, prophylactic antibiotics, VTE prophylaxis, progressive ambulation and ADL's and discharge planning.The patient is planning to be discharged home.      [place] Rasheka Denard   PA-C  01/09/2017, 11:09 PM

## 2017-01-10 ENCOUNTER — Inpatient Hospital Stay (HOSPITAL_COMMUNITY): Payer: PPO | Admitting: Certified Registered Nurse Anesthetist

## 2017-01-10 ENCOUNTER — Encounter (HOSPITAL_COMMUNITY): Payer: Self-pay | Admitting: Certified Registered Nurse Anesthetist

## 2017-01-10 ENCOUNTER — Inpatient Hospital Stay (HOSPITAL_COMMUNITY): Payer: PPO

## 2017-01-10 ENCOUNTER — Inpatient Hospital Stay (HOSPITAL_COMMUNITY)
Admission: RE | Admit: 2017-01-10 | Discharge: 2017-01-11 | DRG: 470 | Disposition: A | Discharge status: Home / Self Care | Payer: PPO | Source: Ambulatory Visit | Attending: Orthopedic Surgery | Admitting: Orthopedic Surgery

## 2017-01-10 ENCOUNTER — Encounter (HOSPITAL_COMMUNITY)
Admission: RE | Disposition: A | Discharge status: Home / Self Care | Payer: Self-pay | Source: Ambulatory Visit | Attending: Orthopedic Surgery

## 2017-01-10 DIAGNOSIS — M179 Osteoarthritis of knee, unspecified: Secondary | ICD-10-CM | POA: Diagnosis not present

## 2017-01-10 DIAGNOSIS — R269 Unspecified abnormalities of gait and mobility: Secondary | ICD-10-CM | POA: Diagnosis not present

## 2017-01-10 DIAGNOSIS — Z96642 Presence of left artificial hip joint: Secondary | ICD-10-CM

## 2017-01-10 DIAGNOSIS — Z87891 Personal history of nicotine dependence: Secondary | ICD-10-CM | POA: Diagnosis not present

## 2017-01-10 DIAGNOSIS — Z885 Allergy status to narcotic agent status: Secondary | ICD-10-CM

## 2017-01-10 DIAGNOSIS — Z833 Family history of diabetes mellitus: Secondary | ICD-10-CM | POA: Diagnosis not present

## 2017-01-10 DIAGNOSIS — Z888 Allergy status to other drugs, medicaments and biological substances status: Secondary | ICD-10-CM | POA: Diagnosis not present

## 2017-01-10 DIAGNOSIS — I10 Essential (primary) hypertension: Secondary | ICD-10-CM | POA: Diagnosis present

## 2017-01-10 DIAGNOSIS — Z90710 Acquired absence of both cervix and uterus: Secondary | ICD-10-CM | POA: Diagnosis not present

## 2017-01-10 DIAGNOSIS — Z9049 Acquired absence of other specified parts of digestive tract: Secondary | ICD-10-CM

## 2017-01-10 DIAGNOSIS — Z96641 Presence of right artificial hip joint: Secondary | ICD-10-CM | POA: Diagnosis present

## 2017-01-10 DIAGNOSIS — Z96649 Presence of unspecified artificial hip joint: Secondary | ICD-10-CM

## 2017-01-10 DIAGNOSIS — Z96653 Presence of artificial knee joint, bilateral: Secondary | ICD-10-CM | POA: Diagnosis not present

## 2017-01-10 DIAGNOSIS — M1612 Unilateral primary osteoarthritis, left hip: Secondary | ICD-10-CM | POA: Diagnosis not present

## 2017-01-10 DIAGNOSIS — R1314 Dysphagia, pharyngoesophageal phase: Secondary | ICD-10-CM | POA: Diagnosis present

## 2017-01-10 DIAGNOSIS — D509 Iron deficiency anemia, unspecified: Secondary | ICD-10-CM | POA: Diagnosis present

## 2017-01-10 DIAGNOSIS — Z471 Aftercare following joint replacement surgery: Secondary | ICD-10-CM | POA: Diagnosis not present

## 2017-01-10 DIAGNOSIS — E039 Hypothyroidism, unspecified: Secondary | ICD-10-CM | POA: Diagnosis not present

## 2017-01-10 DIAGNOSIS — R002 Palpitations: Secondary | ICD-10-CM | POA: Diagnosis not present

## 2017-01-10 DIAGNOSIS — Z8249 Family history of ischemic heart disease and other diseases of the circulatory system: Secondary | ICD-10-CM | POA: Diagnosis not present

## 2017-01-10 DIAGNOSIS — M25552 Pain in left hip: Secondary | ICD-10-CM | POA: Diagnosis not present

## 2017-01-10 HISTORY — PX: TOTAL HIP ARTHROPLASTY: SHX124

## 2017-01-10 LAB — TYPE AND SCREEN
ABO/RH(D): AB NEG
ANTIBODY SCREEN: NEGATIVE

## 2017-01-10 IMAGING — DX DG HIP (WITH OR WITHOUT PELVIS) 1V PORT*L*
2 series · 2 of 2 positions shown · non-contrast
Comparison: CT abdomen pelvis of 09/05/2016

CLINICAL DATA: Post left hip replacement

EXAM:
DG HIP (WITH OR WITHOUT PELVIS) 1V PORT LEFT

[pelvis ap]
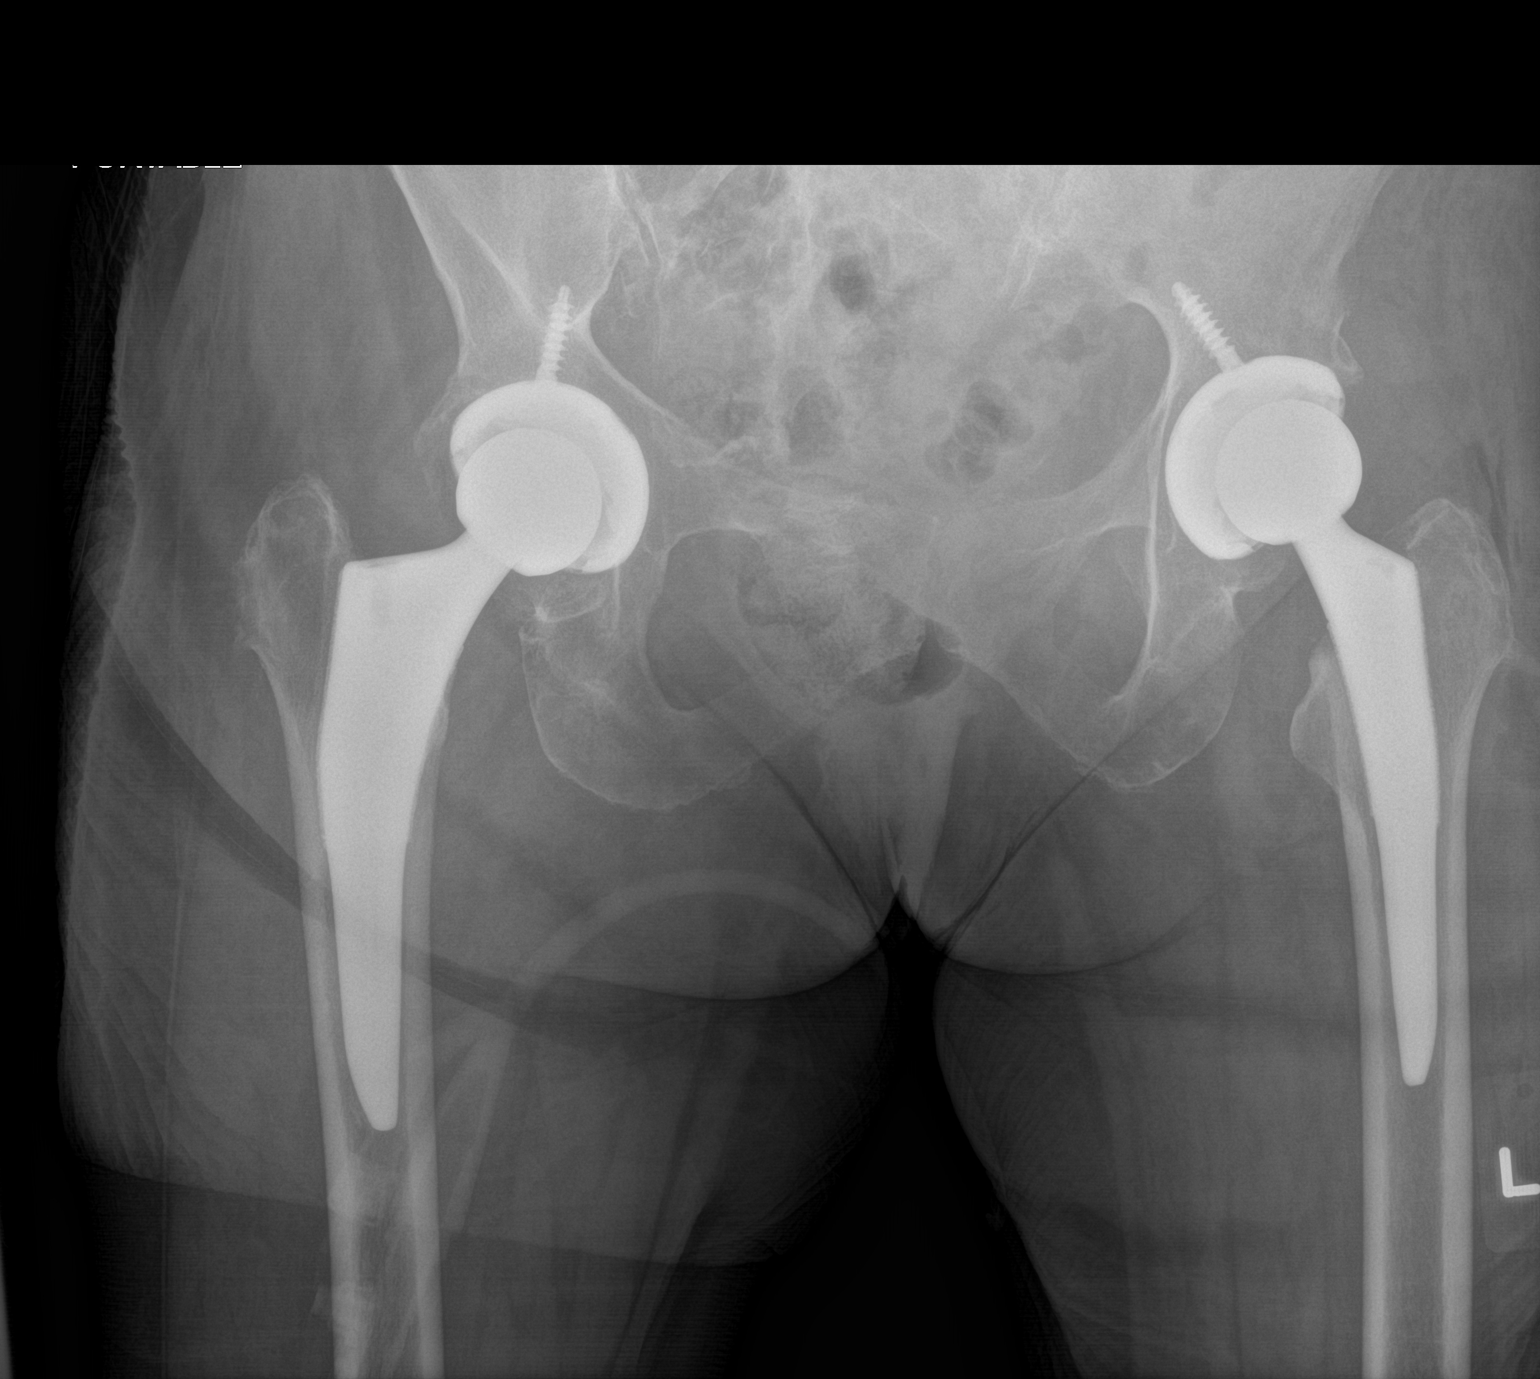

[hip lat]
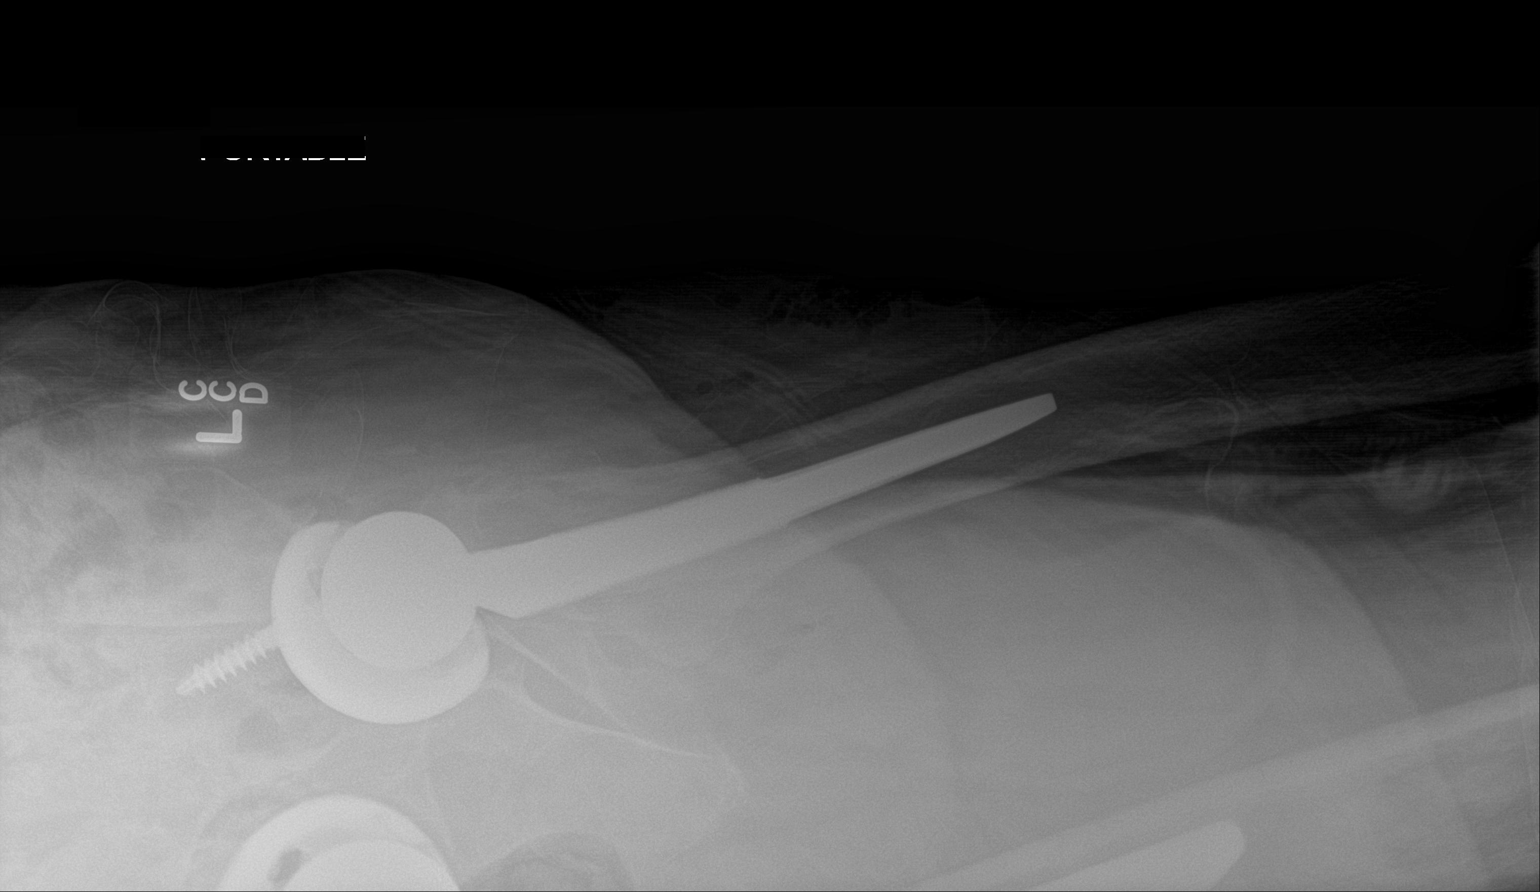

[2 of 2 positions shown; findings below may reference images not displayed]

FINDINGS: Previous right total hip replacement components appear unchanged. A
new left total hip replacement is now present. No complicating
features are seen. Alignment appears grossly normal. The bones do
appear to be diffusely osteopenic.
IMPRESSION: 1. New left total hip replacement components in good position with
no complicating features.
2. Stable previous right total hip replacement components.

## 2017-01-10 SURGERY — ARTHROPLASTY, HIP, TOTAL, ANTERIOR APPROACH
Anesthesia: Spinal | Site: Hip | Laterality: Left

## 2017-01-10 MED ORDER — HYDROMORPHONE HCL 1 MG/ML IJ SOLN
0.2500 mg | INTRAMUSCULAR | Status: DC | PRN
  Administered 2017-01-10 (×2): 0.25 mg via INTRAVENOUS
  Administered 2017-01-10: 0.5 mg via INTRAVENOUS
  Administered 2017-01-10 (×2): 0.25 mg via INTRAVENOUS

## 2017-01-10 MED ORDER — MIDAZOLAM HCL 2 MG/2ML IJ SOLN
INTRAMUSCULAR | Status: AC
  Filled 2017-01-10: qty 2

## 2017-01-10 MED ORDER — PHENOL 1.4 % MT LIQD: 1.0000 | OROMUCOSAL | Status: DC | PRN

## 2017-01-10 MED ORDER — PROPOFOL 10 MG/ML IV BOLUS
INTRAVENOUS | Status: AC
  Filled 2017-01-10: qty 40

## 2017-01-10 MED ORDER — SODIUM CHLORIDE 0.9 % IV SOLN
INTRAVENOUS | Status: DC
  Administered 2017-01-10 – 2017-01-11 (×2): via INTRAVENOUS

## 2017-01-10 MED ORDER — FENTANYL CITRATE (PF) 100 MCG/2ML IJ SOLN
INTRAMUSCULAR | Status: DC | PRN
  Administered 2017-01-10 (×5): 25 ug via INTRAVENOUS
  Administered 2017-01-10: 50 ug via INTRAVENOUS

## 2017-01-10 MED ORDER — ONDANSETRON HCL 4 MG/2ML IJ SOLN
INTRAMUSCULAR | Status: DC | PRN
  Administered 2017-01-10: 4 mg via INTRAVENOUS

## 2017-01-10 MED ORDER — LACTATED RINGERS IV SOLN
INTRAVENOUS | Status: DC | PRN
  Administered 2017-01-10 (×2): via INTRAVENOUS

## 2017-01-10 MED ORDER — CHLORHEXIDINE GLUCONATE 4 % EX LIQD: 60.0000 mL | Freq: Once | CUTANEOUS | Status: DC

## 2017-01-10 MED ORDER — FENTANYL CITRATE (PF) 100 MCG/2ML IJ SOLN
INTRAMUSCULAR | Status: AC
  Filled 2017-01-10: qty 2

## 2017-01-10 MED ORDER — METHOCARBAMOL 500 MG PO TABS
500.0000 mg | ORAL_TABLET | Freq: Four times a day (QID) | ORAL | Status: DC | PRN
  Administered 2017-01-11: 500 mg via ORAL
  Filled 2017-01-10: qty 1

## 2017-01-10 MED ORDER — DEXAMETHASONE SODIUM PHOSPHATE 10 MG/ML IJ SOLN
10.0000 mg | Freq: Once | INTRAMUSCULAR | Status: AC
  Administered 2017-01-11: 10 mg via INTRAVENOUS
  Filled 2017-01-10: qty 1

## 2017-01-10 MED ORDER — CELECOXIB 200 MG PO CAPS
200.0000 mg | ORAL_CAPSULE | Freq: Two times a day (BID) | ORAL | Status: DC
  Administered 2017-01-10 – 2017-01-11 (×3): 200 mg via ORAL
  Filled 2017-01-10 (×3): qty 1

## 2017-01-10 MED ORDER — TRANEXAMIC ACID 1000 MG/10ML IV SOLN
1000.0000 mg | INTRAVENOUS | Status: AC
  Administered 2017-01-10: 1000 mg via INTRAVENOUS
  Filled 2017-01-10: qty 1100

## 2017-01-10 MED ORDER — DOCUSATE SODIUM 100 MG PO CAPS
100.0000 mg | ORAL_CAPSULE | Freq: Two times a day (BID) | ORAL | Status: DC
  Administered 2017-01-10: 100 mg via ORAL
  Filled 2017-01-10 (×2): qty 1

## 2017-01-10 MED ORDER — BISACODYL 10 MG RE SUPP: 10.0000 mg | Freq: Every day | RECTAL | Status: DC | PRN

## 2017-01-10 MED ORDER — DEXAMETHASONE SODIUM PHOSPHATE 10 MG/ML IJ SOLN
INTRAMUSCULAR | Status: AC
  Filled 2017-01-10: qty 1

## 2017-01-10 MED ORDER — ASPIRIN 81 MG PO CHEW: 81.0000 mg | CHEWABLE_TABLET | Freq: Two times a day (BID) | ORAL | 0 refills | Status: AC

## 2017-01-10 MED ORDER — POLYETHYLENE GLYCOL 3350 17 G PO PACK
17.0000 g | PACK | Freq: Two times a day (BID) | ORAL | Status: DC
  Administered 2017-01-10: 17 g via ORAL
  Filled 2017-01-10 (×2): qty 1

## 2017-01-10 MED ORDER — BUPIVACAINE IN DEXTROSE 0.75-8.25 % IT SOLN
INTRATHECAL | Status: DC | PRN
  Administered 2017-01-10: 1.8 mL via INTRATHECAL

## 2017-01-10 MED ORDER — 0.9 % SODIUM CHLORIDE (POUR BTL) OPTIME
TOPICAL | Status: DC | PRN
  Administered 2017-01-10: 1000 mL

## 2017-01-10 MED ORDER — PROPOFOL 500 MG/50ML IV EMUL
INTRAVENOUS | Status: DC | PRN
  Administered 2017-01-10: 100 ug/kg/min via INTRAVENOUS

## 2017-01-10 MED ORDER — CEFAZOLIN SODIUM-DEXTROSE 2-4 GM/100ML-% IV SOLN
2.0000 g | INTRAVENOUS | Status: AC
  Administered 2017-01-10: 2 g via INTRAVENOUS

## 2017-01-10 MED ORDER — FENTANYL CITRATE (PF) 100 MCG/2ML IJ SOLN: 25.0000 ug | INTRAMUSCULAR | Status: DC | PRN

## 2017-01-10 MED ORDER — LEVOTHYROXINE SODIUM 112 MCG PO TABS
112.0000 ug | ORAL_TABLET | Freq: Every day | ORAL | Status: DC
  Administered 2017-01-11: 112 ug via ORAL
  Filled 2017-01-10: qty 1

## 2017-01-10 MED ORDER — CEFAZOLIN SODIUM-DEXTROSE 2-4 GM/100ML-% IV SOLN
INTRAVENOUS | Status: AC
  Filled 2017-01-10: qty 100

## 2017-01-10 MED ORDER — TRANEXAMIC ACID 1000 MG/10ML IV SOLN
1000.0000 mg | Freq: Once | INTRAVENOUS | Status: AC
  Administered 2017-01-10: 1000 mg via INTRAVENOUS
  Filled 2017-01-10: qty 1100
  Filled 2017-01-10: qty 10

## 2017-01-10 MED ORDER — HYDROCHLOROTHIAZIDE 25 MG PO TABS
12.5000 mg | ORAL_TABLET | Freq: Every day | ORAL | Status: DC
  Administered 2017-01-11: 12.5 mg via ORAL
  Filled 2017-01-10: qty 1

## 2017-01-10 MED ORDER — ONDANSETRON HCL 4 MG PO TABS: 4.0000 mg | ORAL_TABLET | Freq: Four times a day (QID) | ORAL | Status: DC | PRN

## 2017-01-10 MED ORDER — STERILE WATER FOR IRRIGATION IR SOLN
Status: DC | PRN
  Administered 2017-01-10: 2000 mL

## 2017-01-10 MED ORDER — DIPHENHYDRAMINE HCL 25 MG PO CAPS: 25.0000 mg | ORAL_CAPSULE | Freq: Four times a day (QID) | ORAL | Status: DC | PRN

## 2017-01-10 MED ORDER — DEXAMETHASONE SODIUM PHOSPHATE 10 MG/ML IJ SOLN: 10.0000 mg | Freq: Once | INTRAMUSCULAR | Status: DC

## 2017-01-10 MED ORDER — PROMETHAZINE HCL 25 MG/ML IJ SOLN: 6.2500 mg | INTRAMUSCULAR | Status: DC | PRN

## 2017-01-10 MED ORDER — LIDOCAINE HCL 1 % IJ SOLN
INTRAMUSCULAR | Status: AC
  Filled 2017-01-10: qty 20

## 2017-01-10 MED ORDER — MIDAZOLAM HCL 5 MG/5ML IJ SOLN
INTRAMUSCULAR | Status: DC | PRN
Start: 2017-01-10 — End: 2017-01-10
  Administered 2017-01-10 (×2): 1 mg via INTRAVENOUS

## 2017-01-10 MED ORDER — LACTATED RINGERS IV SOLN
INTRAVENOUS | Status: DC
  Administered 2017-01-10: 1000 mL via INTRAVENOUS

## 2017-01-10 MED ORDER — ONDANSETRON HCL 4 MG/2ML IJ SOLN: 4.0000 mg | Freq: Four times a day (QID) | INTRAMUSCULAR | Status: DC | PRN

## 2017-01-10 MED ORDER — DOCUSATE SODIUM 100 MG PO CAPS
100.0000 mg | ORAL_CAPSULE | Freq: Two times a day (BID) | ORAL | 0 refills | Status: DC
Start: 2017-01-10 — End: 2017-05-26

## 2017-01-10 MED ORDER — HYDROMORPHONE HCL 1 MG/ML IJ SOLN
INTRAMUSCULAR | Status: AC
  Administered 2017-01-10: 0.25 mg via INTRAVENOUS
  Filled 2017-01-10: qty 1

## 2017-01-10 MED ORDER — POLYETHYLENE GLYCOL 3350 17 G PO PACK: 17.0000 g | PACK | Freq: Two times a day (BID) | ORAL | 0 refills | Status: DC

## 2017-01-10 MED ORDER — METHOCARBAMOL 1000 MG/10ML IJ SOLN
500.0000 mg | Freq: Four times a day (QID) | INTRAVENOUS | Status: DC | PRN
  Administered 2017-01-10: 500 mg via INTRAVENOUS
  Filled 2017-01-10: qty 550

## 2017-01-10 MED ORDER — ALUM & MAG HYDROXIDE-SIMETH 200-200-20 MG/5ML PO SUSP: 15.0000 mL | ORAL | Status: DC | PRN

## 2017-01-10 MED ORDER — CEFAZOLIN SODIUM-DEXTROSE 2-4 GM/100ML-% IV SOLN
2.0000 g | Freq: Four times a day (QID) | INTRAVENOUS | Status: AC
  Administered 2017-01-10 (×2): 2 g via INTRAVENOUS
  Filled 2017-01-10 (×2): qty 100

## 2017-01-10 MED ORDER — METOCLOPRAMIDE HCL 5 MG/ML IJ SOLN: 5.0000 mg | Freq: Three times a day (TID) | INTRAMUSCULAR | Status: DC | PRN

## 2017-01-10 MED ORDER — METHOCARBAMOL 500 MG PO TABS: 500.0000 mg | ORAL_TABLET | Freq: Four times a day (QID) | ORAL | 0 refills | Status: DC | PRN

## 2017-01-10 MED ORDER — ONDANSETRON HCL 4 MG/2ML IJ SOLN
INTRAMUSCULAR | Status: AC
  Filled 2017-01-10: qty 2

## 2017-01-10 MED ORDER — OXYCODONE HCL 5 MG PO TABS
5.0000 mg | ORAL_TABLET | ORAL | Status: DC
  Administered 2017-01-10: 10 mg via ORAL
  Administered 2017-01-10: 15 mg via ORAL
  Administered 2017-01-10: 10 mg via ORAL
  Administered 2017-01-11 (×2): 5 mg via ORAL
  Filled 2017-01-10: qty 1
  Filled 2017-01-10: qty 2
  Filled 2017-01-10: qty 3
  Filled 2017-01-10: qty 1
  Filled 2017-01-10: qty 2
  Filled 2017-01-10: qty 1

## 2017-01-10 MED ORDER — HYDROMORPHONE HCL 1 MG/ML IJ SOLN
INTRAMUSCULAR | Status: AC
  Administered 2017-01-10: 0.5 mg via INTRAVENOUS
  Filled 2017-01-10: qty 0.5

## 2017-01-10 MED ORDER — PROPOFOL 10 MG/ML IV BOLUS
INTRAVENOUS | Status: AC
  Filled 2017-01-10: qty 20

## 2017-01-10 MED ORDER — MAGNESIUM CITRATE PO SOLN: 1.0000 | Freq: Once | ORAL | Status: DC | PRN

## 2017-01-10 MED ORDER — DEXAMETHASONE SODIUM PHOSPHATE 10 MG/ML IJ SOLN
INTRAMUSCULAR | Status: DC | PRN
  Administered 2017-01-10: 10 mg via INTRAVENOUS

## 2017-01-10 MED ORDER — ASPIRIN 81 MG PO CHEW
81.0000 mg | CHEWABLE_TABLET | Freq: Two times a day (BID) | ORAL | Status: DC
  Administered 2017-01-10 – 2017-01-11 (×2): 81 mg via ORAL
  Filled 2017-01-10 (×2): qty 1

## 2017-01-10 MED ORDER — PANTOPRAZOLE SODIUM 40 MG PO TBEC: 40.0000 mg | DELAYED_RELEASE_TABLET | Freq: Every day | ORAL | Status: DC | PRN

## 2017-01-10 MED ORDER — METOCLOPRAMIDE HCL 5 MG PO TABS
5.0000 mg | ORAL_TABLET | Freq: Three times a day (TID) | ORAL | Status: DC | PRN
Start: 2017-01-10 — End: 2017-01-11

## 2017-01-10 MED ORDER — FERROUS SULFATE 325 (65 FE) MG PO TABS
325.0000 mg | ORAL_TABLET | Freq: Three times a day (TID) | ORAL | Status: DC
  Administered 2017-01-11: 325 mg via ORAL
  Filled 2017-01-10: qty 1

## 2017-01-10 MED ORDER — OXYCODONE HCL 5 MG PO TABS: 5.0000 mg | ORAL_TABLET | ORAL | 0 refills | Status: DC | PRN

## 2017-01-10 MED ORDER — MENTHOL 3 MG MT LOZG: 1.0000 | LOZENGE | OROMUCOSAL | Status: DC | PRN

## 2017-01-10 SURGICAL SUPPLY — 33 items
BAG ZIPLOCK 12X15 (MISCELLANEOUS) IMPLANT
BLADE SAG 18X100X1.27 (BLADE) ×2 IMPLANT
CAPT HIP TOTAL 2 ×2 IMPLANT
CLOTH BEACON ORANGE TIMEOUT ST (SAFETY) ×2 IMPLANT
COVER PERINEAL POST (MISCELLANEOUS) ×2 IMPLANT
COVER SURGICAL LIGHT HANDLE (MISCELLANEOUS) ×2 IMPLANT
DERMABOND ADVANCED (GAUZE/BANDAGES/DRESSINGS) ×1
DERMABOND ADVANCED .7 DNX12 (GAUZE/BANDAGES/DRESSINGS) ×1 IMPLANT
DRAPE STERI IOBAN 125X83 (DRAPES) ×2 IMPLANT
DRAPE U-SHAPE 47X51 STRL (DRAPES) ×4 IMPLANT
DRESSING AQUACEL AG SP 3.5X10 (GAUZE/BANDAGES/DRESSINGS) ×1 IMPLANT
DRSG AQUACEL AG SP 3.5X10 (GAUZE/BANDAGES/DRESSINGS) ×2
DURAPREP 26ML APPLICATOR (WOUND CARE) ×2 IMPLANT
ELECT REM PT RETURN 15FT ADLT (MISCELLANEOUS) ×2 IMPLANT
GLOVE BIOGEL M 7.0 STRL (GLOVE) ×2 IMPLANT
GLOVE BIOGEL M STRL SZ7.5 (GLOVE) ×4 IMPLANT
GLOVE BIOGEL PI IND STRL 7.0 (GLOVE) ×1 IMPLANT
GLOVE BIOGEL PI IND STRL 7.5 (GLOVE) ×4 IMPLANT
GLOVE BIOGEL PI INDICATOR 7.0 (GLOVE) ×1
GLOVE BIOGEL PI INDICATOR 7.5 (GLOVE) ×4
GLOVE ORTHO TXT STRL SZ7.5 (GLOVE) ×2 IMPLANT
GOWN STRL REUS W/TWL LRG LVL3 (GOWN DISPOSABLE) ×4 IMPLANT
GOWN STRL REUS W/TWL XL LVL3 (GOWN DISPOSABLE) ×4 IMPLANT
HOLDER FOLEY CATH W/STRAP (MISCELLANEOUS) ×2 IMPLANT
PACK ANTERIOR HIP CUSTOM (KITS) ×2 IMPLANT
SUT MNCRL AB 4-0 PS2 18 (SUTURE) ×2 IMPLANT
SUT STRATAFIX 0 PDS 27 VIOLET (SUTURE) ×2
SUT VIC AB 1 CT1 36 (SUTURE) ×6 IMPLANT
SUT VIC AB 2-0 CT1 27 (SUTURE) ×2
SUT VIC AB 2-0 CT1 TAPERPNT 27 (SUTURE) ×2 IMPLANT
SUTURE STRATFX 0 PDS 27 VIOLET (SUTURE) ×1 IMPLANT
TRAY FOLEY CATH SILVER 14FR (SET/KITS/TRAYS/PACK) ×2 IMPLANT
YANKAUER SUCT BULB TIP 10FT TU (MISCELLANEOUS) ×2 IMPLANT

## 2017-01-10 NOTE — Anesthesia Procedure Notes (Signed)
Spinal  Patient location during procedure: OR Start time: 01/10/2017 8:50 AM End time: 01/10/2017 8:51 AM Staffing Anesthesiologist: Quetzal Meany, Iona Beard Performed: anesthesiologist  Preanesthetic Checklist Completed: patient identified, site marked, surgical consent, pre-op evaluation, timeout performed, IV checked, risks and benefits discussed and monitors and equipment checked Spinal Block Patient position: sitting Prep: Betadine Patient monitoring: heart rate, continuous pulse ox and blood pressure Injection technique: single-shot Needle Needle type: Spinocan  Needle gauge: 22 G Needle length: 9 cm Additional Notes Expiration date of kit checked and confirmed. Patient tolerated procedure well, without complications.

## 2017-01-10 NOTE — Discharge Instructions (Signed)

## 2017-01-10 NOTE — Interval H&P Note (Signed)
History and Physical Interval Note:  01/10/2017 6:44 AM  Heidi Adams  has presented today for surgery, with the diagnosis of Left hip osteoarthritis  The various methods of treatment have been discussed with the patient and family. After consideration of risks, benefits and other options for treatment, the patient has consented to  Procedure(s): LEFT TOTAL HIP ARTHROPLASTY ANTERIOR APPROACH (Left) as a surgical intervention .  The patient's history has been reviewed, patient examined, no change in status, stable for surgery.  I have reviewed the patient's chart and labs.  Questions were answered to the patient's satisfaction.     [name]

## 2017-01-10 NOTE — Anesthesia Postprocedure Evaluation (Signed)
Anesthesia Post Note  Patient: Heidi Adams  Procedure(s) Performed: Procedure(s) (LRB): LEFT TOTAL HIP ARTHROPLASTY ANTERIOR APPROACH (Left)     Patient location during evaluation: PACU Anesthesia Type: Spinal Level of consciousness: oriented and awake and alert Pain management: pain level controlled Vital Signs Assessment: post-procedure vital signs reviewed and stable Respiratory status: spontaneous breathing, respiratory function stable and patient connected to nasal cannula oxygen Cardiovascular status: blood pressure returned to baseline and stable Postop Assessment: no headache and no backache Anesthetic complications: no    Last Vitals:  Vitals:   01/10/17 1145 01/10/17 1200  BP: (!) 170/97 (!) 157/67  Pulse: 85 77  Resp: 15 10  Temp:      Last Pain:  Vitals:   01/10/17 1200  TempSrc:   PainSc: Asleep                 Dalisa Forrer S

## 2017-01-10 NOTE — Progress Notes (Signed)
PT Cancellation Note  Patient Details Name: MIRYAH RALLS MRN: [id number] DOB: 11-24-1935   Cancelled Treatment:    Reason Eval/Treat Not Completed: Patient declined, no reason specified (pt requested PT see her tomorrow morning. Will follow. )   Philomena Doheny 01/10/2017, 3:46 PM [phone]

## 2017-01-10 NOTE — Transfer of Care (Signed)
Immediate Anesthesia Transfer of Care Note  Patient: Heidi Adams  Procedure(s) Performed: Procedure(s): LEFT TOTAL HIP ARTHROPLASTY ANTERIOR APPROACH (Left)  Patient Location: PACU  Anesthesia Type:MAC and Spinal  Level of Consciousness:  sedated, patient cooperative and responds to stimulation  Airway & Oxygen Therapy:Patient Spontanous Breathing and Patient connected to face mask oxgen  Post-op Assessment:  Report given to PACU RN and Post -op Vital signs reviewed and stable  Post vital signs:  Reviewed and stable  Last Vitals:  Vitals:   01/10/17 0623  BP: (!) 158/66  Pulse: 82  Resp: 18  Temp: 25.0 C    Complications: No apparent anesthesia complications

## 2017-01-10 NOTE — Progress Notes (Signed)
Pt doesn't want to take 15 mg of oxycodone as she is afraid it might make her sick. Will waste 5 mg tablet and give 10 mg.

## 2017-01-10 NOTE — Op Note (Signed)
NAME:  Heidi Adams NO.: [id number]      MEDICAL RECORD NO.: [id number]      FACILITY:  [facility]      PHYSICIAN:  Paralee Cancel D  DATE OF BIRTH:  Feb 22, 1936     DATE OF PROCEDURE:  01/10/2017                                 OPERATIVE REPORT         PREOPERATIVE DIAGNOSIS: Left  hip osteoarthritis.      POSTOPERATIVE DIAGNOSIS:  Left hip osteoarthritis.      PROCEDURE:  Left total hip replacement through an anterior approach   utilizing DePuy THR system, component size 73mm pinnacle cup, a size 32+4 neutral   Altrex liner, a size 4 Hi Tri Lock stem with a 32+1 delta ceramic   ball.      SURGEON:  Pietro Cassis. Alvan Dame, M.D.      ASSISTANT:  Nehemiah Massed, PA-C     ANESTHESIA:  Spinal.      SPECIMENS:  None.      COMPLICATIONS:  None.      BLOOD LOSS:  250 cc     DRAINS:  None.      INDICATION OF THE PROCEDURE:  Heidi Adams is a 81 y.o. female who had   presented to office for evaluation of left hip pain.  Radiographs revealed   progressive degenerative changes with bone-on-bone   articulation to the  hip joint.  The patient had painful limited range of   motion significantly affecting their overall quality of life.  The patient was failing to    respond to conservative measures, and at this point was ready   to proceed with more definitive measures.  The patient has noted progressive   degenerative changes in his hip, progressive problems and dysfunction   with regarding the hip prior to surgery.  Consent was obtained for   benefit of pain relief.  Specific risk of infection, DVT, component   failure, dislocation, need for revision surgery, as well discussion of   the anterior versus posterior approach were reviewed.  Consent was   obtained for benefit of anterior pain relief through an anterior   approach.      PROCEDURE IN DETAIL:  The patient was brought to operative theater.   Once adequate anesthesia, preoperative  antibiotics, 2 gm of Ancef, 1 gm of Tranexamic Acid, and 10 mg of Decadron administered.   The patient was positioned supine on the OSI Hanna table.  Once adequate   padding of boney process was carried out, we had predraped out the hip, and  used fluoroscopy to confirm orientation of the pelvis and position.      The left hip was then prepped and draped from proximal iliac crest to   mid thigh with shower curtain technique.      Time-out was performed identifying the patient, planned procedure, and   extremity.     An incision was then made 2 cm distal and lateral to the   anterior superior iliac spine extending over the orientation of the   tensor fascia lata muscle and sharp dissection was carried down to the   fascia of the muscle and protractor placed in the soft tissues.      The fascia  was then incised.  The muscle belly was identified and swept   laterally and retractor placed along the superior neck.  Following   cauterization of the circumflex vessels and removing some pericapsular   fat, a second cobra retractor was placed on the inferior neck.  A third   retractor was placed on the anterior acetabulum after elevating the   anterior rectus.  A L-capsulotomy was along the line of the   superior neck to the trochanteric fossa, then extended proximally and   distally.  Tag sutures were placed and the retractors were then placed   intracapsular.  We then identified the trochanteric fossa and   orientation of my neck cut, confirmed this radiographically   and then made a neck osteotomy with the femur on traction.  The femoral   head was removed without difficulty or complication.  Traction was let   off and retractors were placed posterior and anterior around the   acetabulum.      The labrum and foveal tissue were debrided.  I began reaming with a 83mm   reamer and reamed up to 17mm reamer with good bony bed preparation and a 73mm   cup was chosen.  The final 25mm Pinnacle cup  was then impacted under fluoroscopy  to confirm the depth of penetration and orientation with respect to   abduction.  A screw was placed followed by the hole eliminator.  The final   32+4 neutral Altrex liner was impacted with good visualized rim fit.  The cup was positioned anatomically within the acetabular portion of the pelvis.      At this point, the femur was rolled at 80 degrees.  Further capsule was   released off the inferior aspect of the femoral neck.  I then   released the superior capsule proximally.  The hook was placed laterally   along the femur and elevated manually and held in position with the bed   hook.  The leg was then extended and adducted with the leg rolled to 100   degrees of external rotation.  Once the proximal femur was fully   exposed, I used a box osteotome to set orientation.  I then began   broaching with the starting chili pepper broach and passed this by hand and then broached up to 4.  With the 4 broach in place I chose a high offset neck and did several trial reductions to try and match other hip previously replaced.  The offset was appropriate, leg lengths   appeared to be equal best matched with the +1 head ball confirmed radiographically.   Given these findings, I went ahead and dislocated the hip, repositioned all   retractors and positioned the right hip in the extended and abducted position.  The final 4 Hi Tri Lock stem was   chosen and it was impacted down to the level of neck cut.  Based on this   and the trial reduction, a 32+1 delta ceramic ball was chosen and   impacted onto a clean and dry trunnion, and the hip was reduced.  The   hip had been irrigated throughout the case again at this point.  I did   reapproximate the superior capsular leaflet to the anterior leaflet   using #1 Vicryl.  The fascia of the   tensor fascia lata muscle was then reapproximated using #1 Vicryl and #0 V-lock sutures.  The   remaining wound was closed with 2-0  Vicryl and running 4-0  Monocryl.   The hip was cleaned, dried, and dressed sterilely using Dermabond and   Aquacel dressing.  She was then brought   to recovery room in stable condition tolerating the procedure well.    Nehemiah Massed, PA-C was present for the entirety of the case involved from   preoperative positioning, perioperative retractor management, general   facilitation of the case, as well as primary wound closure as assistant.            Pietro Cassis Alvan Dame, M.D.        01/10/2017 10:16 AM

## 2017-01-10 NOTE — Anesthesia Preprocedure Evaluation (Signed)
Anesthesia Evaluation  Patient identified by MRN, date of birth, ID band Patient awake    Reviewed: Allergy & Precautions, NPO status , Patient's Chart, lab work & pertinent test results  Airway Mallampati: II  TM Distance: >3 FB Neck ROM: Full    Dental no notable dental hx.    Pulmonary neg pulmonary ROS, former smoker,    Pulmonary exam normal breath sounds clear to auscultation       Cardiovascular hypertension, Normal cardiovascular exam+ dysrhythmias Atrial Fibrillation  Rhythm:Regular Rate:Normal     Neuro/Psych negative neurological ROS  negative psych ROS   GI/Hepatic negative GI ROS, Neg liver ROS,   Endo/Other  Hypothyroidism   Renal/GU negative Renal ROS  negative genitourinary   Musculoskeletal negative musculoskeletal ROS (+)   Abdominal   Peds negative pediatric ROS (+)  Hematology negative hematology ROS (+)   Anesthesia Other Findings   Reproductive/Obstetrics negative OB ROS                             Anesthesia Physical Anesthesia Plan  ASA: III  Anesthesia Plan: Spinal   Post-op Pain Management:    Induction: Intravenous  PONV Risk Score and Plan: 1 and Ondansetron and Dexamethasone  Airway Management Planned: Simple Face Mask  Additional Equipment:   Intra-op Plan:   Post-operative Plan:   Informed Consent: I have reviewed the patients History and Physical, chart, labs and discussed the procedure including the risks, benefits and alternatives for the proposed anesthesia with the patient or authorized representative who has indicated his/her understanding and acceptance.   Dental advisory given  Plan Discussed with: CRNA and Surgeon  Anesthesia Plan Comments:         Anesthesia Quick Evaluation

## 2017-01-11 ENCOUNTER — Encounter (HOSPITAL_COMMUNITY): Payer: Self-pay | Admitting: Orthopedic Surgery

## 2017-01-11 LAB — CBC
HCT: 30.2 % — ABNORMAL LOW (ref 36.0–46.0)
HEMOGLOBIN: 9.4 g/dL — AB (ref 12.0–15.0)
MCH: 25.6 pg — AB (ref 26.0–34.0)
MCHC: 31.1 g/dL (ref 30.0–36.0)
MCV: 82.3 fL (ref 78.0–100.0)
Platelets: 266 10*3/uL (ref 150–400)
RBC: 3.67 MIL/uL — AB (ref 3.87–5.11)
RDW: 18.6 % — ABNORMAL HIGH (ref 11.5–15.5)
WBC: 11.4 10*3/uL — ABNORMAL HIGH (ref 4.0–10.5)

## 2017-01-11 LAB — BASIC METABOLIC PANEL
Anion gap: 9 (ref 5–15)
BUN: 16 mg/dL (ref 6–20)
CHLORIDE: 104 mmol/L (ref 101–111)
CO2: 26 mmol/L (ref 22–32)
CREATININE: 0.5 mg/dL (ref 0.44–1.00)
Calcium: 8.8 mg/dL — ABNORMAL LOW (ref 8.9–10.3)
GFR calc Af Amer: >60 mL/min (ref >60)
GFR calc non Af Amer: >60 mL/min (ref >60)
GLUCOSE: 107 mg/dL — AB (ref 65–99)
POTASSIUM: 3.7 mmol/L (ref 3.5–5.1)
Sodium: 139 mmol/L (ref 135–145)

## 2017-01-11 LAB — URINALYSIS, ROUTINE W REFLEX MICROSCOPIC
Bilirubin Urine: NEGATIVE
GLUCOSE, UA: NEGATIVE mg/dL
HGB URINE DIPSTICK: NEGATIVE
Ketones, ur: NEGATIVE mg/dL
Leukocytes, UA: NEGATIVE
Nitrite: NEGATIVE
Protein, ur: NEGATIVE mg/dL
Specific Gravity, Urine: 1.006 (ref 1.005–1.030)
pH: 6 (ref 5.0–8.0)

## 2017-01-11 MED ORDER — ACETAMINOPHEN 325 MG PO TABS
650.0000 mg | ORAL_TABLET | Freq: Four times a day (QID) | ORAL | Status: DC | PRN
  Administered 2017-01-11: 650 mg via ORAL
  Filled 2017-01-11: qty 2

## 2017-01-11 MED ORDER — SODIUM CHLORIDE 0.9 % IV SOLN
510.0000 mg | Freq: Once | INTRAVENOUS | Status: AC
  Administered 2017-01-11: 510 mg via INTRAVENOUS
  Filled 2017-01-11: qty 17

## 2017-01-11 NOTE — Progress Notes (Signed)
Physical Therapy Treatment Patient Details Name: Heidi Adams MRN: [id number] DOB: 1936-03-24 Today's Date: 01/11/2017    History of Present Illness Pt is a 81 year old female s/p L direct anterior THA with hx of bil TKAs    PT Comments    Pt performed a few exercises and verbally reviewed a few exercises.  Pt reports she is familiar with exercises from previous orthopaedic surgeries.  Pt also practiced safe stair technique.  Answered pt's questions within scope of practice and pt feels ready for d/c home later today.    Follow Up Recommendations  DC plan and follow up therapy as arranged by surgeon;Home health PT     Equipment Recommendations  None recommended by PT    Recommendations for Other Services       Precautions / Restrictions Precautions Precautions: Fall Restrictions Other Position/Activity Restrictions: WBAT    Mobility  Bed Mobility Overal bed mobility: Needs Assistance Bed Mobility: Sit to Supine;Supine to Sit     Supine to sit: Min assist Sit to supine: Min assist   General bed mobility comments: assist for L LE per pt request  Transfers Overall transfer level: Needs assistance Equipment used: Rolling walker (2 wheeled) Transfers: Sit to/from Stand Sit to Stand: Min guard         General transfer comment: verbal cues for UE and LE positioning  Ambulation/Gait Ambulation/Gait assistance: Min guard Ambulation Distance (Feet): 60 Feet Assistive device: Rolling walker (2 wheeled) Gait Pattern/deviations: Step-through pattern;Decreased stride length;Antalgic;Decreased stance time - left     General Gait Details: verbal cues for RW positioning, posture, distance to pt tolerance   Stairs Stairs: Yes   Stair Management: One rail Right;Two rails;Step to pattern;Forwards Number of Stairs: 2 General stair comments: verbal cues for sequence and safety, performed first with one rail and HHA (pt has cane), and then pt performed again without cues  and used both rails, performs well  Wheelchair Mobility    Modified Rankin (Stroke Patients Only)       Balance                                            Cognition Arousal/Alertness: Awake/alert Behavior During Therapy: WFL for tasks assessed/performed Overall Cognitive Status: Within Functional Limits for tasks assessed                                        Exercises Total Joint Exercises Heel Slides: AROM;Left;10 reps Straight Leg Raises: AAROM;Left;10 reps Long Arc Quad: AROM;5 reps;Left Marching in Standing: AROM;Left;10 reps Other Exercises Other Exercises: demonstrated and pt performed 1-2 reps of the following exercises: standing hip flexion, hip abduction, hip extension, ham curls, heel raises and squats (aware to use counter top for support) (pt reports familarity with exercises from bil TKAs)    General Comments        Pertinent Vitals/Pain Pain Assessment: 0-10 Pain Score: 5  Pain Location: L hip Pain Descriptors / Indicators: Sore;Aching Pain Intervention(s): Limited activity within patient's tolerance;Monitored during session;Repositioned    Home Living                      Prior Function            PT Goals (current goals can now be  found in the care plan section) Acute Rehab PT Goals PT Goal Formulation: With patient Time For Goal Achievement: 01/13/17 Potential to Achieve Goals: Good Progress towards PT goals: Progressing toward goals    Frequency    7X/week      PT Plan Current plan remains appropriate    Co-evaluation              AM-PAC PT "6 Clicks" Daily Activity  Outcome Measure  Difficulty turning over in bed (including adjusting bedclothes, sheets and blankets)?: A Little Difficulty moving from lying on back to sitting on the side of the bed? : Total Difficulty sitting down on and standing up from a chair with arms (e.g., wheelchair, bedside commode, etc,.)?: Total Help  needed moving to and from a bed to chair (including a wheelchair)?: A Little Help needed walking in hospital room?: A Little Help needed climbing 3-5 steps with a railing? : A Little 6 Click Score: 14    End of Session   Activity Tolerance: Patient tolerated treatment well Patient left: in bed;with call bell/phone within reach;with family/visitor present   PT Visit Diagnosis: Other abnormalities of gait and mobility (R26.89);Pain Pain - Right/Left: Left Pain - part of body: Hip     Time: 1350-1415 PT Time Calculation (min) (ACUTE ONLY): 25 min  Charges:  $Gait Training: 8-22 mins $Therapeutic Exercise: 8-22 mins                    G Codes:       Carmelia Bake, PT, DPT 01/11/2017 Pager: [phone]   York Ram E 01/11/2017, 2:25 PM

## 2017-01-11 NOTE — Progress Notes (Signed)
Discharge planning, no HH needs identified. No PT planned, has DME. [phone] 

## 2017-01-11 NOTE — Progress Notes (Signed)
    Durable Medical Equipment        Start     Ordered   01/11/17 1355  For home use only DME Walker rolling  Once    Question:  Patient needs a walker to treat with the following condition  Answer:  S/P hip replacement   01/11/17 1355    [phone]

## 2017-01-11 NOTE — Progress Notes (Signed)
     Subjective: 1 Day Post-Op Procedure(s) (LRB): LEFT TOTAL HIP ARTHROPLASTY ANTERIOR APPROACH (Left)   Patient reports pain as mild, pain controlled. No events throughout the night.  Mentioned the pain she was having in her knee prior to surgery, hoping the pain will resolve as she heals from this surgery.   Discussed receiving an iron infusion and how Dr Virgina Jock wanted her to have another in the hospital if possible.  This will be given after consulting pharmacy.  She is able to be discharged home if she does well with PT and tolerates her infusion without difficulty.  Objective:   VITALS:   Vitals:   01/11/17 0508 01/11/17 0633  BP:  (!) 145/56  Pulse: 72 72  Resp: 16 16  Temp:  98.2 F (36.8 C)    Dorsiflexion/Plantar flexion intact Incision: dressing C/D/I No cellulitis present Compartment soft  LABS  Recent Labs  01/11/17 0513  HGB 9.4*  HCT 30.2*  WBC 11.4*  PLT 266     Recent Labs  01/11/17 0513  NA 139  K 3.7  BUN 16  CREATININE 0.50  GLUCOSE 107*     Assessment/Plan: 1 Day Post-Op Procedure(s) (LRB): LEFT TOTAL HIP ARTHROPLASTY ANTERIOR APPROACH (Left) Foley cath d/c'ed Advance diet Up with therapy D/C IV fluids Discharge home Follow up in 2 weeks at [facility]. Follow up with OLIN,Ayden Hardwick D in 2 weeks.  Contact information:  [facility] [address], [address] [place] [address] [phone]    Iron deficiency anemia Iron infusion today Continue oral iron at home. Follow up with Dr. Virgina Jock, her PCP     [place] Pugh. Demontray Franta   PAC  01/11/2017, 9:30 AM

## 2017-01-11 NOTE — Progress Notes (Signed)
OT Cancellation Note  Patient Details Name: Heidi Adams MRN: [id number] DOB: 1936-05-02   Cancelled Treatment:    Reason Eval/Treat Not Completed: OT screened, no needs identified, will sign off  Maries, Thereasa Parkin 01/11/2017, 12:00 PM

## 2017-01-11 NOTE — Evaluation (Signed)
Physical Therapy Evaluation Patient Details Name: Heidi Adams MRN: [id number] DOB: 1936/03/26 Today's Date: 01/11/2017   History of Present Illness  Pt is a 81 year old female s/p L direct anterior THA with hx of bil TKAs  Clinical Impression  Pt is s/p THA resulting in the deficits listed below (see PT Problem List).  Pt will benefit from skilled PT to increase their independence and safety with mobility to allow discharge to the venue listed below.  Pt assisted to bathroom and then ambulated short distance in hallway.  Pt hopeful to d/c home later today.     Follow Up Recommendations DC plan and follow up therapy as arranged by surgeon;Home health PT    Equipment Recommendations  None recommended by PT    Recommendations for Other Services       Precautions / Restrictions Precautions Precautions: Fall Restrictions Weight Bearing Restrictions: No Other Position/Activity Restrictions: WBAT      Mobility  Bed Mobility Overal bed mobility: Needs Assistance Bed Mobility: Supine to Sit     Supine to sit: Mod assist     General bed mobility comments: L LE assist and pt requested assist for trunk as well  Transfers Overall transfer level: Needs assistance Equipment used: Rolling walker (2 wheeled) Transfers: Sit to/from Stand           General transfer comment: verbal cues for UE and LE positioning  Ambulation/Gait Ambulation/Gait assistance: Min guard Ambulation Distance (Feet): 40 Feet Assistive device: Rolling walker (2 wheeled) Gait Pattern/deviations: Step-through pattern;Decreased stride length;Antalgic;Decreased stance time - left     General Gait Details: verbal cues for sequencing, RW positioning, distance to pt tolerance  Stairs            Wheelchair Mobility    Modified Rankin (Stroke Patients Only)       Balance                                             Pertinent Vitals/Pain Pain Assessment: 0-10 Pain Score: 6   Pain Location: L hip Pain Descriptors / Indicators: Sore;Aching Pain Intervention(s): Repositioned;Monitored during session;Limited activity within patient's tolerance;Premedicated before session    Home Living Family/patient expects to be discharged to:: Private residence Living Arrangements: Spouse/significant other Available Help at Discharge: Available PRN/intermittently Type of Home: House Home Access: Stairs to enter Entrance Stairs-Rails: None Technical brewer of Steps: 2+1 Home Layout: One level Home Equipment: Bedside commode;Walker - 2 wheels      Prior Function Level of Independence: Independent               Hand Dominance        Extremity/Trunk Assessment        Lower Extremity Assessment Lower Extremity Assessment: LLE deficits/detail LLE Deficits / Details: anticipated post op hip weakness, L knee also tender to palpation however pt reports L knee pain has improved after current surgery       Communication   Communication: No difficulties  Cognition Arousal/Alertness: Awake/alert Behavior During Therapy: WFL for tasks assessed/performed Overall Cognitive Status: Within Functional Limits for tasks assessed                                        General Comments      Exercises  Assessment/Plan    PT Assessment Patient needs continued PT services  PT Problem List Decreased strength;Decreased range of motion;Decreased mobility;Pain       PT Treatment Interventions Functional mobility training;Stair training;Gait training;DME instruction;Therapeutic activities;Therapeutic exercise;Patient/family education    PT Goals (Current goals can be found in the Care Plan section)  Acute Rehab PT Goals PT Goal Formulation: With patient Time For Goal Achievement: 01/13/17 Potential to Achieve Goals: Good    Frequency 7X/week   Barriers to discharge        Co-evaluation               AM-PAC PT "6 Clicks" Daily  Activity  Outcome Measure Difficulty turning over in bed (including adjusting bedclothes, sheets and blankets)?: A Little Difficulty moving from lying on back to sitting on the side of the bed? : A Lot Difficulty sitting down on and standing up from a chair with arms (e.g., wheelchair, bedside commode, etc,.)?: Total Help needed moving to and from a bed to chair (including a wheelchair)?: A Little Help needed walking in hospital room?: A Little Help needed climbing 3-5 steps with a railing? : A Little 6 Click Score: 15    End of Session Equipment Utilized During Treatment: Gait belt Activity Tolerance: Patient tolerated treatment well Patient left: in chair;with call bell/phone within reach;with family/visitor present   PT Visit Diagnosis: Other abnormalities of gait and mobility (R26.89);Pain Pain - Right/Left: Left Pain - part of body: Hip    Time: 7867-5449 PT Time Calculation (min) (ACUTE ONLY): 21 min   Charges:   PT Evaluation $PT Eval Low Complexity: 1 Procedure     PT G Codes:        Heidi Adams, PT, DPT 01/11/2017 Pager: [phone]   York Ram E 01/11/2017, 12:17 PM

## 2017-01-11 NOTE — Progress Notes (Signed)
IV Iron infusion started at 1438, stopped at 14:53 stayed with patient during infusion. No s/s of allergic reaction or s/s of acute distress. Will continue to monitor patient for 30 minutes before discharge. Per Estill Bamberg in Pharmacy IV iron infusion does not require vital signs or premedications or special requirements.

## 2017-01-17 NOTE — Discharge Summary (Signed)
Physician Discharge Summary  Patient ID: Heidi Adams MRN: [id number] DOB/AGE: 1936/06/24 81 y.o.  Admit date: 01/10/2017 Discharge date: 01/11/2017   Procedures:  Procedure(s) (LRB): LEFT TOTAL HIP ARTHROPLASTY ANTERIOR APPROACH (Left)  Attending Physician:  Dr. Paralee Cancel   Admission Diagnoses:   Left hip primary OA / pain  Discharge Diagnoses:  Principal Problem:   S/P left THA, AA Active Problems:   S/P right THA, AA  Past Medical History:  Diagnosis Date  . Anemia    hx of anemia  . Arthritis   . Cancer ([place])    skin ca nose  . Dysrhythmia    went into Atrial Fibrillation after last surgery  . History of skin cancer   . Hypertension   . PVC (premature ventricular contraction)   . Thyroiditis 1973    HPI:    Heidi Adams, 81 y.o. female, has a history of pain and functional disability in the left hip(s) due to arthritis and patient has failed non-surgical conservative treatments for greater than 12 weeks to include NSAID's and/or analgesics, corticosteriod injections, use of assistive devices and activity modification.  Onset of symptoms was gradual starting 2+ years ago with gradually worsening course since that time.The patient noted no past surgery on the left hip(s).  Patient currently rates pain in the left hip at 10 out of 10 with activity. Patient has night pain, worsening of pain with activity and weight bearing, trendelenberg gait, pain that interfers with activities of daily living and pain with passive range of motion. Patient has evidence of periarticular osteophytes and joint space narrowing by imaging studies. This condition presents safety issues increasing the risk of falls.  There is no current active infection.   Risks, benefits and expectations were discussed with the patient.  Risks including but not limited to the risk of anesthesia, blood clots, nerve damage, blood vessel damage, failure of the prosthesis, infection and up to and including death.   Patient understand the risks, benefits and expectations and wishes to proceed with surgery.   PCP: [name], MD   Discharged Condition: good  Hospital Course:  Patient underwent the above stated procedure on 01/10/2017. Patient tolerated the procedure well and brought to the recovery room in good condition and subsequently to the floor.  POD #1 BP: 145/56 ; Pulse: 72 ; Temp: 98.2 F (36.8 C) ; Resp: 16 Patient reports pain as mild, pain controlled. No events throughout the night.  Mentioned the pain she was having in her knee prior to surgery, hoping the pain will resolve as she heals from this surgery.   Discussed receiving an iron infusion and how Dr Virgina Jock wanted her to have another in the hospital if possible.  This will be given after consulting pharmacy.  She is able to be discharged home if she does well with PT and tolerates her infusion without difficulty. Dorsiflexion/plantar flexion intact, incision: dressing C/D/I, no cellulitis present and compartment soft.   LABS  Basename    HGB     9.4  HCT     30.2    Discharge Exam: General appearance: alert, cooperative and no distress Extremities: Homans sign is negative, no sign of DVT, no edema, redness or tenderness in the calves or thighs and no ulcers, gangrene or trophic changes  Disposition: Home with follow up in 2 weeks   Follow-up Information    Paralee Cancel, MD. Schedule an appointment as soon as possible for a visit in 2 week(s).   Specialty:  Orthopedic Surgery Contact information: [address] [place] [address] [phone]           Discharge Instructions    Call MD / Call 911    Complete by:  As directed    If you experience chest pain or shortness of breath, CALL 911 and be transported to the hospital emergency room.  If you develope a fever above 101 F, pus (white drainage) or increased drainage or redness at the wound, or calf pain, call your surgeon's office.   Change dressing     Complete by:  As directed    Maintain surgical dressing until follow up in the clinic. If the edges start to pull up, may reinforce with tape. If the dressing is no longer working, may remove and cover with gauze and tape, but must keep the area dry and clean.  Call with any questions or concerns.   Constipation Prevention    Complete by:  As directed    Drink plenty of fluids.  Prune juice may be helpful.  You may use a stool softener, such as Colace (over the counter) 100 mg twice a day.  Use MiraLax (over the counter) for constipation as needed.   Diet - low sodium heart healthy    Complete by:  As directed    Discharge instructions    Complete by:  As directed    Maintain surgical dressing until follow up in the clinic. If the edges start to pull up, may reinforce with tape. If the dressing is no longer working, may remove and cover with gauze and tape, but must keep the area dry and clean.  Follow up in 2 weeks at [facility]. Call with any questions or concerns.   Increase activity slowly as tolerated    Complete by:  As directed    Weight bearing as tolerated with assist device (walker, cane, etc) as directed, use it as long as suggested by your surgeon or therapist, typically at least 4-6 weeks.   TED hose    Complete by:  As directed    Use stockings (TED hose) for 2 weeks on both leg(s).  You may remove them at night for sleeping.      Allergies as of 01/11/2017      Reactions   Codeine Nausea Only   Epinephrine    Tachcardyia, chest pains tremors   Tenormin [atenolol] Rash      Medication List    STOP taking these medications   acetaminophen 500 MG tablet Commonly known as:  TYLENOL   aspirin EC 81 MG tablet Replaced by:  aspirin 81 MG chewable tablet   ferrous sulfate 325 (65 FE) MG tablet     TAKE these medications   aspirin 81 MG chewable tablet Chew 1 tablet (81 mg total) by mouth 2 (two) times daily. Take for 4 weeks, then resume regular  dose. Replaces:  aspirin EC 81 MG tablet   celecoxib 200 MG capsule Commonly known as:  CELEBREX Take 200 mg by mouth daily. What changed:  Another medication with the same name was removed. Continue taking this medication, and follow the directions you see here.   clobetasol ointment 0.05 % Commonly known as:  TEMOVATE Apply 1 application topically 2 (two) times daily. What changed:  when to take this  reasons to take this   docusate sodium 100 MG capsule Commonly known as:  COLACE Take 1 capsule (100 mg total) by mouth 2 (two) times daily.  hydrochlorothiazide 25 MG tablet Commonly known as:  HYDRODIURIL Take 12.5 mg by mouth daily.   levothyroxine 112 MCG tablet Commonly known as:  SYNTHROID, LEVOTHROID Take 112 mcg by mouth daily before breakfast.   methocarbamol 500 MG tablet Commonly known as:  ROBAXIN Take 1 tablet (500 mg total) by mouth every 6 (six) hours as needed for muscle spasms.   NU-IRON 150 MG capsule Generic drug:  iron polysaccharides Take 150 mg by mouth daily.   oxyCODONE 5 MG immediate release tablet Commonly known as:  Oxy IR/ROXICODONE Take 1-3 tablets (5-15 mg total) by mouth every 4 (four) hours as needed for moderate pain or severe pain. What changed:  reasons to take this   pantoprazole 40 MG tablet Commonly known as:  PROTONIX Take 40 mg by mouth daily as needed (indigestion).   polyethylene glycol packet Commonly known as:  MIRALAX / GLYCOLAX Take 17 g by mouth 2 (two) times daily.   senna 8.6 MG tablet Commonly known as:  SENOKOT Take 34.4 tablets by mouth daily as needed for constipation.   VITAMIN B-12 PO Take 1 tablet by mouth every evening.   Vitamin D 2000 units Caps Take 2,000 Units by mouth daily.        Signed: West Pugh. Ladarren Steiner   PA-C  01/17/2017, 2:12 PM

## 2017-01-19 DIAGNOSIS — R5383 Other fatigue: Secondary | ICD-10-CM | POA: Diagnosis not present

## 2017-01-19 DIAGNOSIS — D649 Anemia, unspecified: Secondary | ICD-10-CM | POA: Diagnosis not present

## 2017-01-20 DIAGNOSIS — M1812 Unilateral primary osteoarthritis of first carpometacarpal joint, left hand: Secondary | ICD-10-CM | POA: Diagnosis not present

## 2017-01-20 DIAGNOSIS — M1811 Unilateral primary osteoarthritis of first carpometacarpal joint, right hand: Secondary | ICD-10-CM | POA: Diagnosis not present

## 2017-01-23 ENCOUNTER — Ambulatory Visit
Admission: RE | Admit: 2017-01-23 | Discharge: 2017-01-23 | Disposition: A | Discharge status: Home / Self Care | Payer: PPO | Source: Ambulatory Visit | Attending: Internal Medicine | Admitting: Internal Medicine

## 2017-01-23 ENCOUNTER — Other Ambulatory Visit: Payer: Self-pay | Admitting: Internal Medicine

## 2017-01-23 DIAGNOSIS — R928 Other abnormal and inconclusive findings on diagnostic imaging of breast: Secondary | ICD-10-CM

## 2017-01-23 DIAGNOSIS — N6489 Other specified disorders of breast: Secondary | ICD-10-CM | POA: Diagnosis not present

## 2017-01-23 DIAGNOSIS — N6320 Unspecified lump in the left breast, unspecified quadrant: Secondary | ICD-10-CM

## 2017-01-26 ENCOUNTER — Other Ambulatory Visit: Payer: PPO

## 2017-01-27 DIAGNOSIS — Z96642 Presence of left artificial hip joint: Secondary | ICD-10-CM | POA: Diagnosis not present

## 2017-01-27 DIAGNOSIS — Z471 Aftercare following joint replacement surgery: Secondary | ICD-10-CM | POA: Diagnosis not present

## 2017-01-29 ENCOUNTER — Emergency Department (HOSPITAL_COMMUNITY): Payer: PPO

## 2017-01-29 ENCOUNTER — Emergency Department (HOSPITAL_COMMUNITY)
Admission: EM | Admit: 2017-01-29 | Discharge: 2017-01-29 | Disposition: A | Discharge status: Home / Self Care | Payer: PPO | Attending: Emergency Medicine | Admitting: Emergency Medicine

## 2017-01-29 ENCOUNTER — Encounter (HOSPITAL_COMMUNITY): Payer: Self-pay

## 2017-01-29 DIAGNOSIS — Z96642 Presence of left artificial hip joint: Secondary | ICD-10-CM | POA: Diagnosis not present

## 2017-01-29 DIAGNOSIS — Z96643 Presence of artificial hip joint, bilateral: Secondary | ICD-10-CM | POA: Diagnosis not present

## 2017-01-29 DIAGNOSIS — E039 Hypothyroidism, unspecified: Secondary | ICD-10-CM | POA: Diagnosis not present

## 2017-01-29 DIAGNOSIS — Z85820 Personal history of malignant melanoma of skin: Secondary | ICD-10-CM | POA: Insufficient documentation

## 2017-01-29 DIAGNOSIS — S73005A Unspecified dislocation of left hip, initial encounter: Secondary | ICD-10-CM

## 2017-01-29 DIAGNOSIS — Z96653 Presence of artificial knee joint, bilateral: Secondary | ICD-10-CM | POA: Diagnosis not present

## 2017-01-29 DIAGNOSIS — M25552 Pain in left hip: Secondary | ICD-10-CM | POA: Diagnosis not present

## 2017-01-29 DIAGNOSIS — Y9222 Religious institution as the place of occurrence of the external cause: Secondary | ICD-10-CM | POA: Diagnosis not present

## 2017-01-29 DIAGNOSIS — X509XXA Other and unspecified overexertion or strenuous movements or postures, initial encounter: Secondary | ICD-10-CM | POA: Insufficient documentation

## 2017-01-29 DIAGNOSIS — Z79899 Other long term (current) drug therapy: Secondary | ICD-10-CM | POA: Diagnosis not present

## 2017-01-29 DIAGNOSIS — I10 Essential (primary) hypertension: Secondary | ICD-10-CM | POA: Diagnosis not present

## 2017-01-29 DIAGNOSIS — Z9889 Other specified postprocedural states: Secondary | ICD-10-CM

## 2017-01-29 DIAGNOSIS — Y998 Other external cause status: Secondary | ICD-10-CM | POA: Diagnosis not present

## 2017-01-29 DIAGNOSIS — Z471 Aftercare following joint replacement surgery: Secondary | ICD-10-CM | POA: Diagnosis not present

## 2017-01-29 DIAGNOSIS — S79912A Unspecified injury of left hip, initial encounter: Secondary | ICD-10-CM | POA: Diagnosis present

## 2017-01-29 DIAGNOSIS — R52 Pain, unspecified: Secondary | ICD-10-CM | POA: Diagnosis not present

## 2017-01-29 DIAGNOSIS — T84021A Dislocation of internal left hip prosthesis, initial encounter: Secondary | ICD-10-CM | POA: Diagnosis not present

## 2017-01-29 DIAGNOSIS — Y939 Activity, unspecified: Secondary | ICD-10-CM | POA: Diagnosis not present

## 2017-01-29 IMAGING — CR DG HIP (WITH OR WITHOUT PELVIS) 2-3V*L*
3 series · 3 of 3 positions shown · non-contrast
Comparison: 01/10/2017

CLINICAL DATA: Left hip pain

EXAM:
DG HIP (WITH OR WITHOUT PELVIS) 2-3V LEFT

[x pelvis]
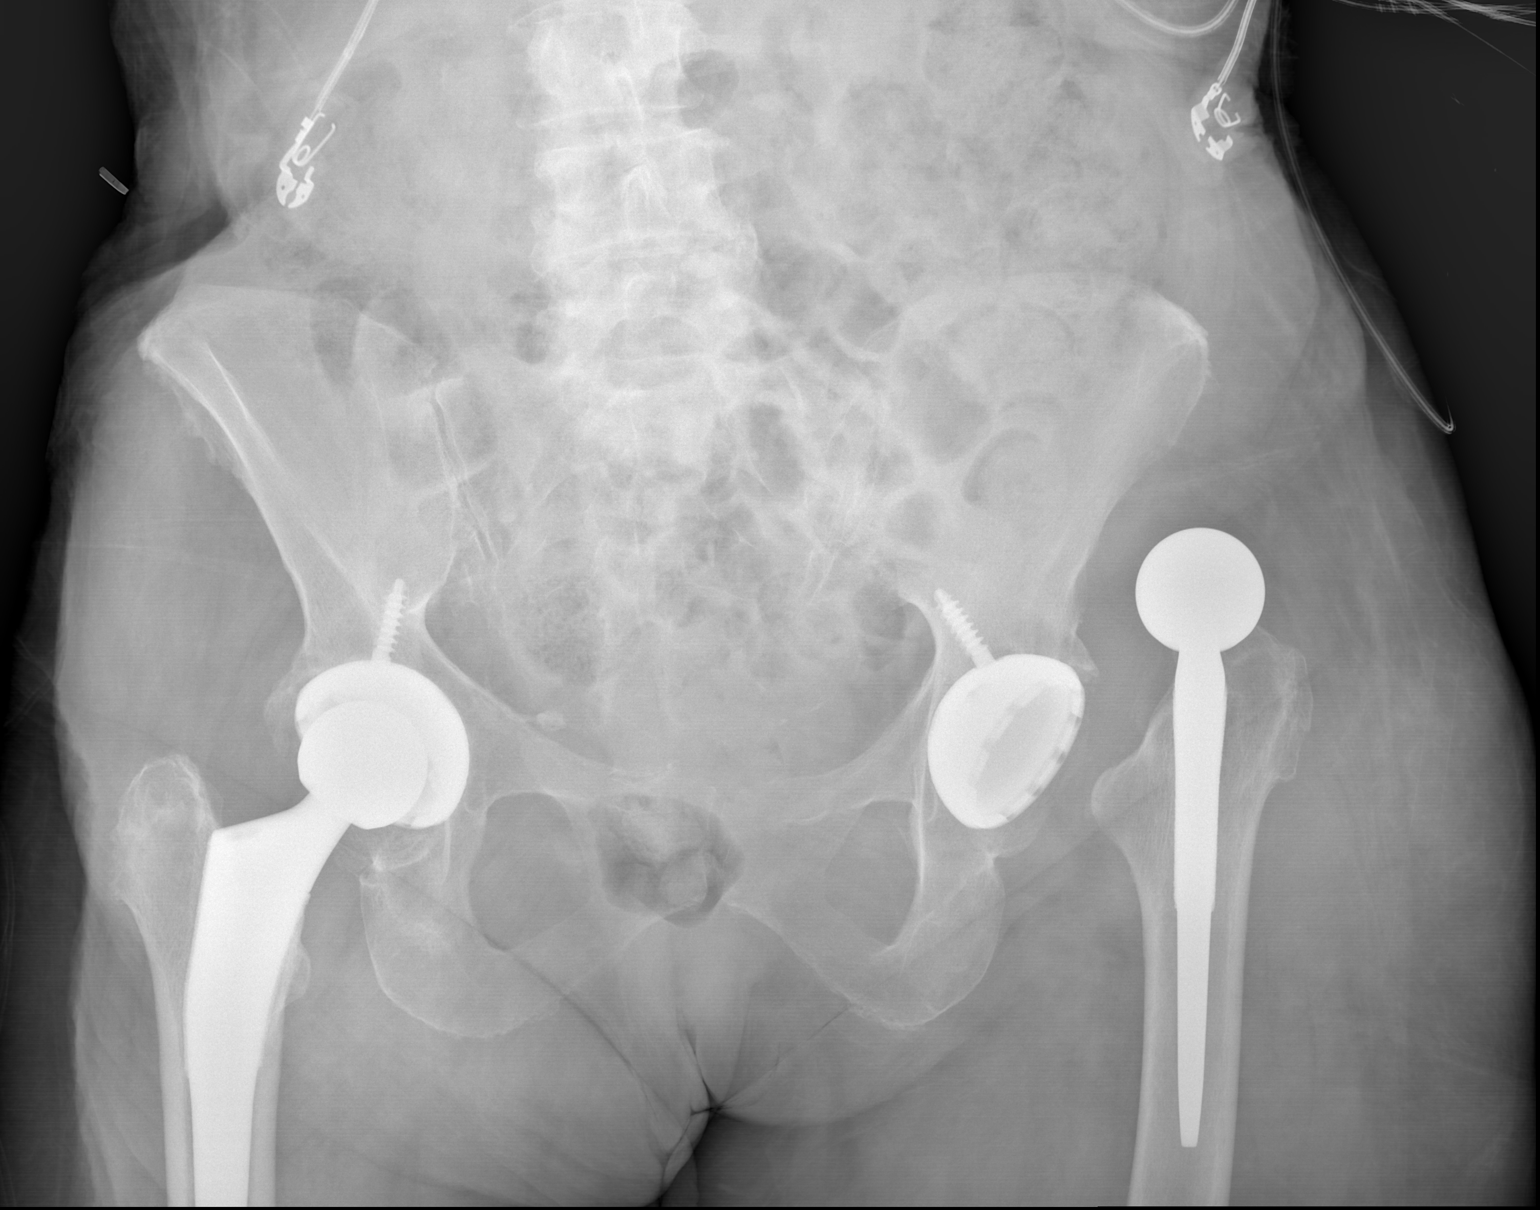

[x hip ap left]
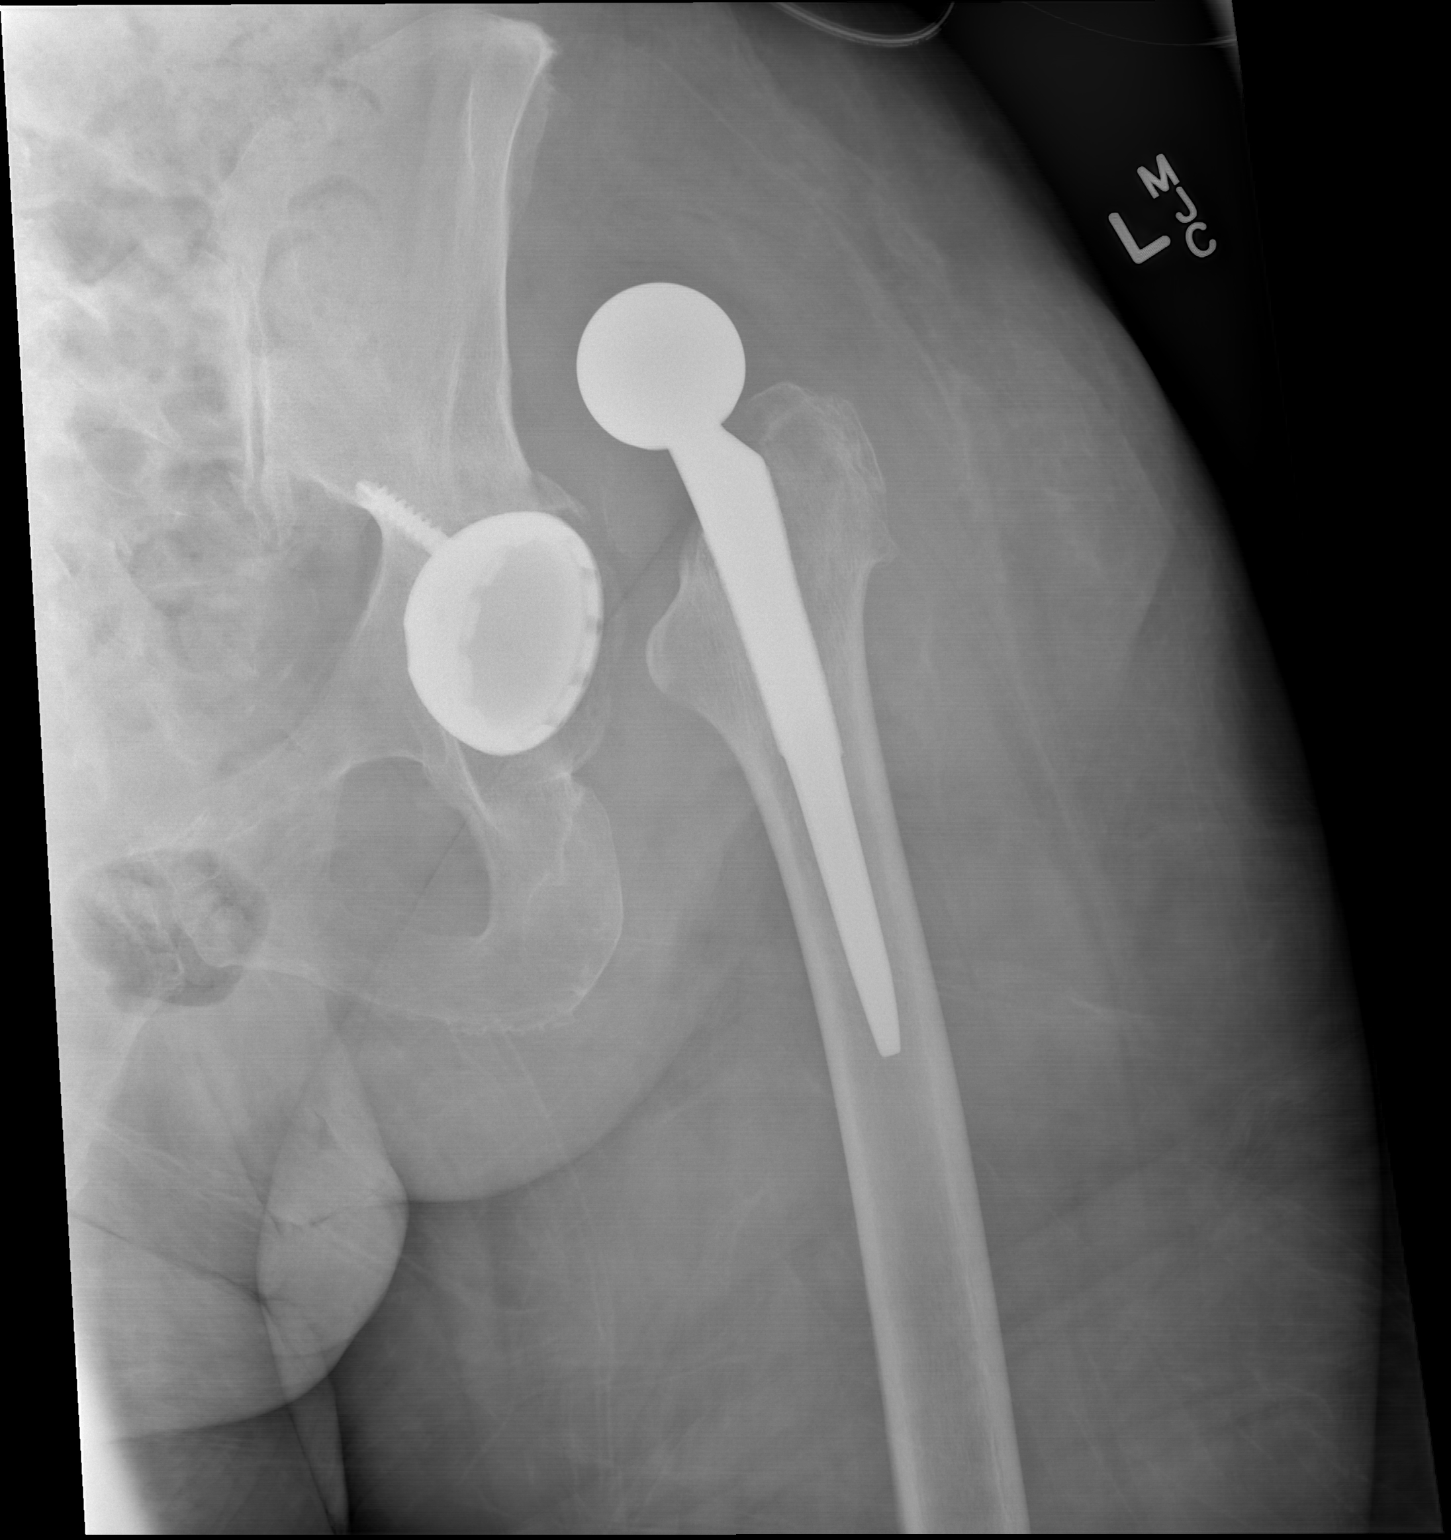

[w hip lat left]
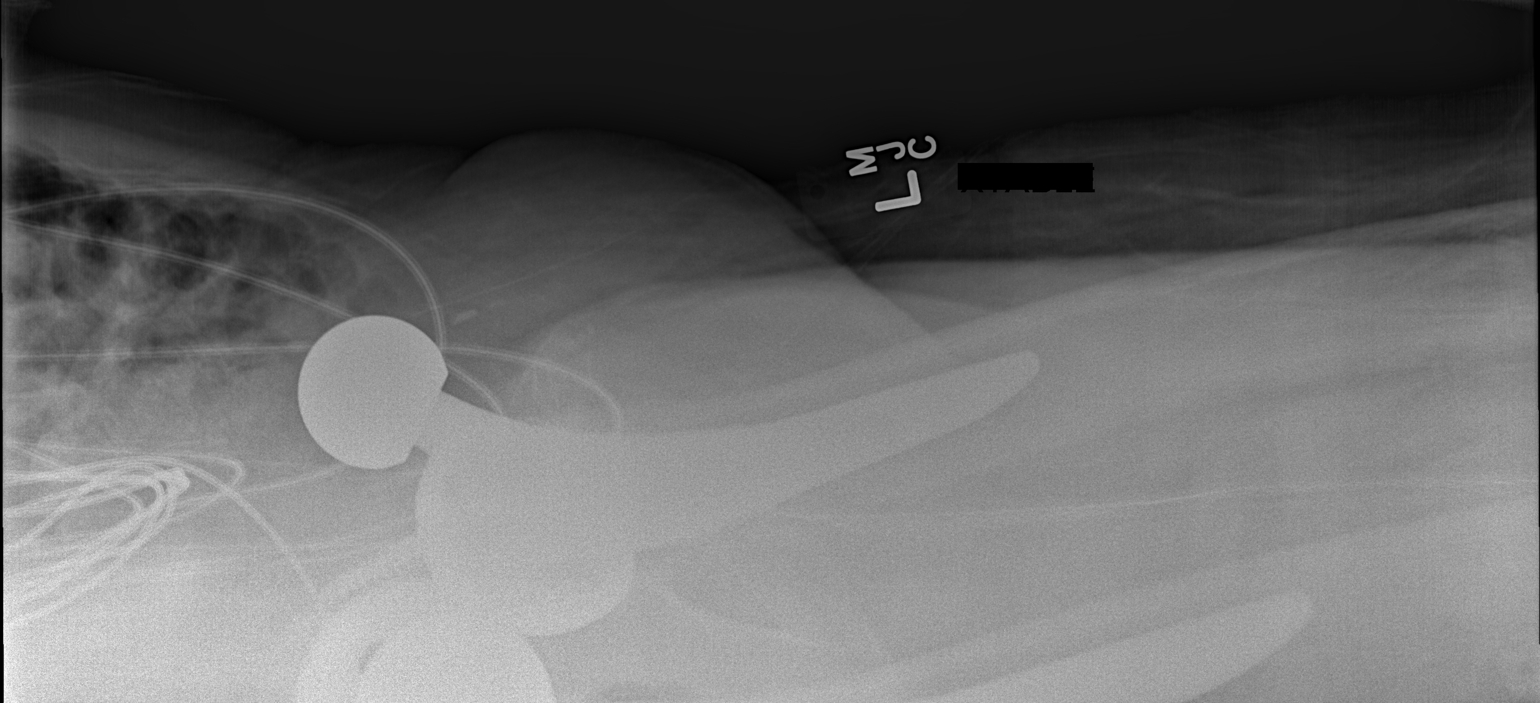

[3 of 3 positions shown; findings below may reference images not displayed]

FINDINGS: Frontal pelvis with AP and cross-table lateral views of the left hip
demonstrate superior left femoral component dislocation in this
patient status post left total hip replacement. No evidence for
periprosthetic fracture. Bones are diffusely demineralized. Patient
also has right total hip replacement.
IMPRESSION: Superior dislocation of the femoral component of the left total hip
replacement.

## 2017-01-29 IMAGING — DX DG PORTABLE PELVIS
1 series · 1 of 1 positions shown · non-contrast
Comparison: Hip radiograph earlier same date.

CLINICAL DATA: Interval reduction dislocated hip prosthesis.

EXAM:
PORTABLE PELVIS 1-2 VIEWS

[pelvis ap]
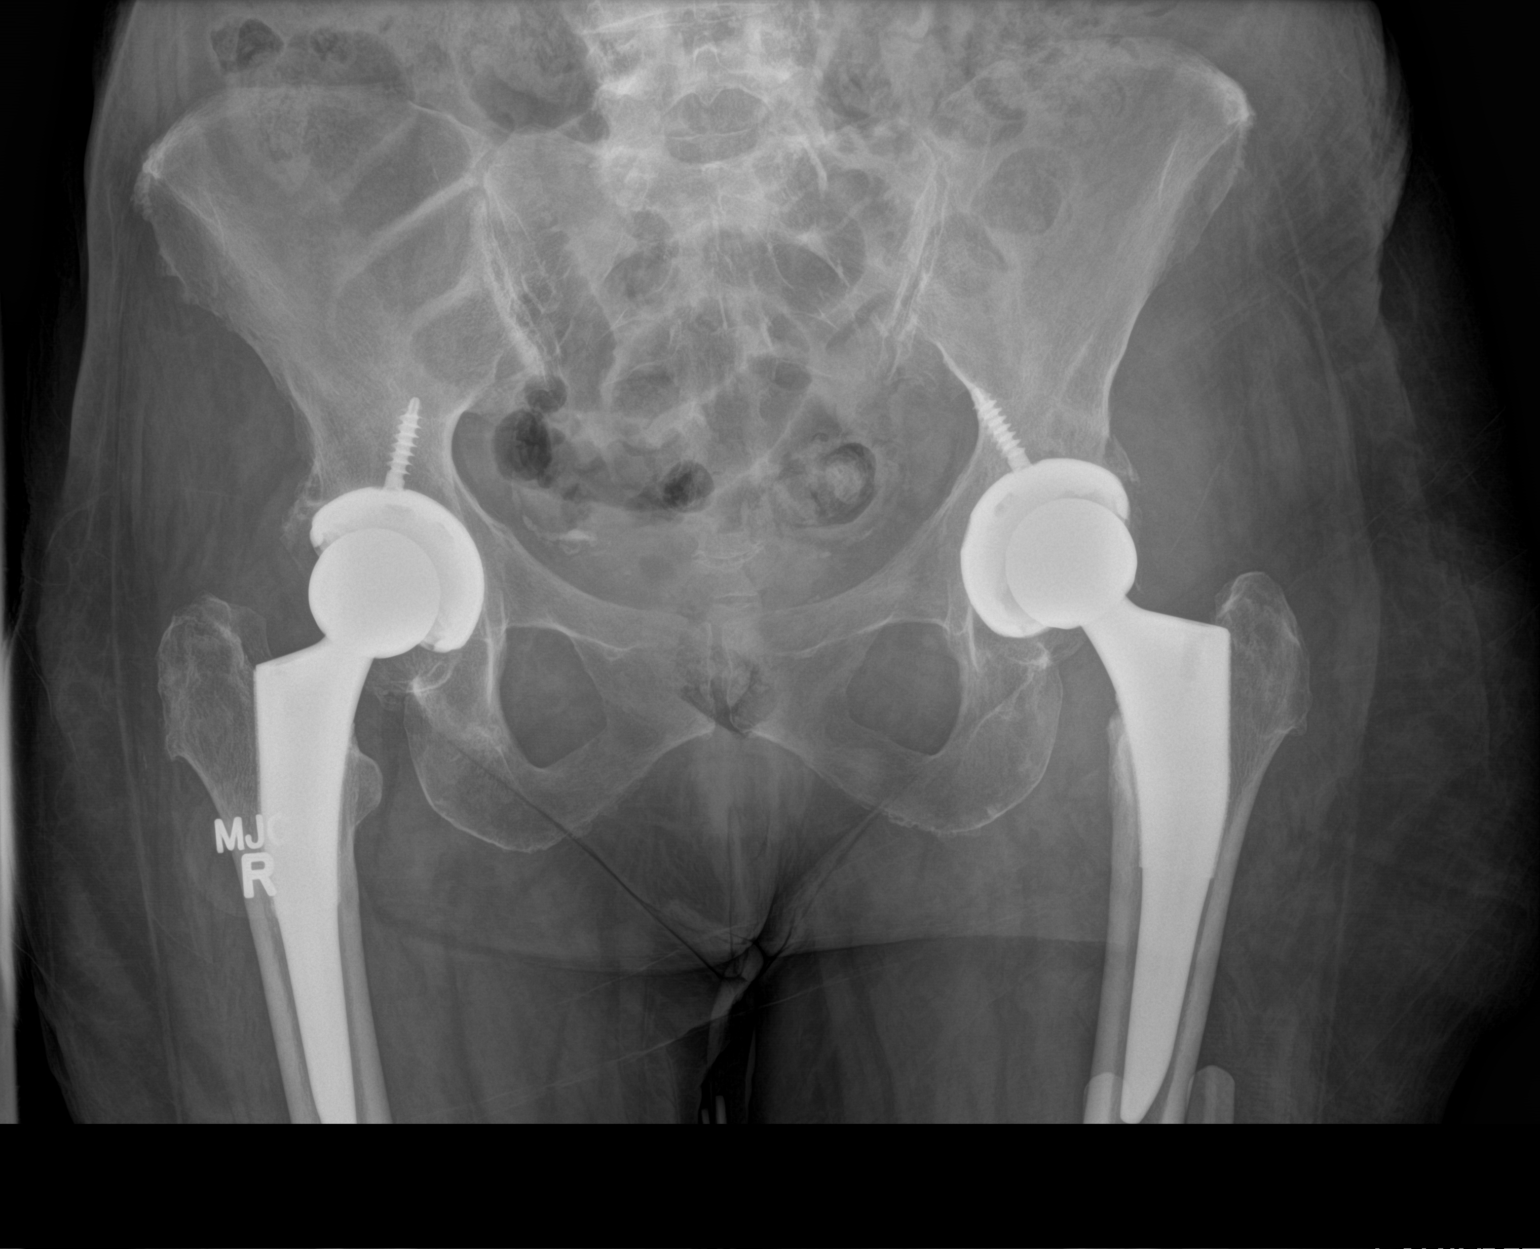

[1 of 1 positions shown; findings below may reference images not displayed]

FINDINGS: Interval relocation of the left hip. Small cortical lucency through
the left superior acetabulum laterally. SI joints are unremarkable.
Lumbar spine degenerative changes. Right hip arthroplasty.
IMPRESSION: Interval relocation left hip.

Small cortical abnormality involving the superior left acetabulum
adjacent to the acetabular component, concerning for fracture.

## 2017-01-29 MED ORDER — PROPOFOL 10 MG/ML IV BOLUS
INTRAVENOUS | Status: DC | PRN
  Administered 2017-01-29: 100 mg via INTRAVENOUS
  Administered 2017-01-29: 20 mg via INTRAVENOUS
  Administered 2017-01-29: 30 mg via INTRAVENOUS

## 2017-01-29 MED ORDER — FENTANYL CITRATE (PF) 100 MCG/2ML IJ SOLN
25.0000 ug | Freq: Once | INTRAMUSCULAR | Status: AC
Start: 2017-01-29 — End: 2017-01-29
  Administered 2017-01-29: 25 ug via INTRAVENOUS
  Filled 2017-01-29: qty 2

## 2017-01-29 MED ORDER — ONDANSETRON HCL 4 MG/2ML IJ SOLN
4.0000 mg | Freq: Once | INTRAMUSCULAR | Status: AC
  Administered 2017-01-29: 4 mg via INTRAVENOUS
  Filled 2017-01-29: qty 2

## 2017-01-29 MED ORDER — PROPOFOL 10 MG/ML IV BOLUS
INTRAVENOUS | Status: AC | PRN
  Administered 2017-01-29: 15 mg via INTRAVENOUS

## 2017-01-29 MED ORDER — OXYCODONE-ACETAMINOPHEN 5-325 MG PO TABS: 1.0000 | ORAL_TABLET | Freq: Four times a day (QID) | ORAL | 0 refills | Status: DC | PRN

## 2017-01-29 MED ORDER — HYDROMORPHONE HCL 1 MG/ML IJ SOLN
1.0000 mg | Freq: Once | INTRAMUSCULAR | Status: AC
  Administered 2017-01-29: 1 mg via INTRAVENOUS
  Filled 2017-01-29: qty 1

## 2017-01-29 MED ORDER — PROPOFOL 10 MG/ML IV BOLUS
100.0000 mg | Freq: Once | INTRAVENOUS | Status: AC
  Administered 2017-01-29: 100 mg via INTRAVENOUS
  Filled 2017-01-29: qty 20

## 2017-01-29 NOTE — ED Notes (Signed)
As I write this, she is awake and alert, visiting with her husband, with her knee immobilizer in place. She has superficial skin tear at right ant. Ankle onto which we apply a sterile dressing.

## 2017-01-29 NOTE — Consult Note (Signed)
ORTHOPAEDIC CONSULTATION  REQUESTING PHYSICIAN: Drenda Freeze, MD  PCP:  [name], MD  Chief Complaint: Left hip pain  HPI: Heidi Adams is a 81 y.o. female who complains of left hip pain. She was sitting at church earlier today. She was about to pivot to her left, when she felt a pop in the left hip. She then experienced severe pain. She was unable to weight-bear. She actually called me from church, and I recommended that she be transported to [facility] emergency department. X-rays were obtained revealing an anterior periprosthetic hip dislocation. There were no falls or other injuries. She is status post left total hip arthroplasty by Dr. Paralee Cancel on 01/10/2017.  Past Medical History:  Diagnosis Date  . Anemia    hx of anemia  . Arthritis   . Cancer ([place])    skin ca nose  . Dysrhythmia    went into Atrial Fibrillation after last surgery  . History of skin cancer   . Hypertension   . PVC (premature ventricular contraction)   . Thyroiditis 1973   Past Surgical History:  Procedure Laterality Date  . ABDOMINAL HYSTERECTOMY  89   TAH BSO  . APPENDECTOMY    . CHOLECYSTECTOMY     dr Georgia Lopes  . ELBOW SURGERY    . EYE SURGERY     "on my eyelids" years ago  . KNEE ARTHROSCOPY     left  . THYROIDECTOMY  1973  . TONSILLECTOMY    . TOTAL HIP ARTHROPLASTY Right 05/10/2016   Procedure: TOTAL HIP ARTHROPLASTY ANTERIOR APPROACH;  Surgeon: Paralee Cancel, MD;  Location: [facility];  Service: Orthopedics;  Laterality: Right;  . TOTAL HIP ARTHROPLASTY Left 01/10/2017   Procedure: LEFT TOTAL HIP ARTHROPLASTY ANTERIOR APPROACH;  Surgeon: Paralee Cancel, MD;  Location: [facility];  Service: Orthopedics;  Laterality: Left;  . TOTAL KNEE ARTHROPLASTY Left 09/28/2015   Procedure: LEFT TOTAL KNEE ARTHROPLASTY;  Surgeon: Paralee Cancel, MD;  Location: [facility];  Service: Orthopedics;  Laterality: Left;  . TOTAL KNEE ARTHROPLASTY Right 08/09/2016   Procedure: RIGHT TOTAL KNEE ARTHROPLASTY;   Surgeon: Paralee Cancel, MD;  Location: [facility];  Service: Orthopedics;  Laterality: Right;  Adductor Block  . Total Left hip arthroplasty     01/10/17 Dr. Alvan Dame   Social History   Social History  . Marital status: Married    Spouse name: N/A  . Number of children: N/A  . Years of education: N/A   Social History Main Topics  . Smoking status: Former Smoker    Quit date: 07/26/1987  . Smokeless tobacco: Never Used  . Alcohol use No  . Drug use: No  . Sexual activity: Not Currently     Comment: 1st intercourse 12 yo-1 partner   Other Topics Concern  . None   Social History Narrative  . None   Family History  Problem Relation Age of Onset  . Diabetes Father   . Hypertension Father    Allergies  Allergen Reactions  . Codeine Nausea Only  . Epinephrine     Tachcardyia, chest pains tremors  . Tenormin [Atenolol] Rash   Prior to Admission medications   Medication Sig Start Date End Date Taking? Authorizing Provider  aspirin 81 MG chewable tablet Chew 1 tablet (81 mg total) by mouth 2 (two) times daily. Take for 4 weeks, then resume regular dose. 01/11/17 02/10/17  Danae Orleans, PA-C  celecoxib (CELEBREX) 200 MG capsule Take 200 mg by mouth daily. 10/18/16  [provider]  Cholecalciferol (VITAMIN D) 2000 units CAPS Take 2,000 Units by mouth daily.    [provider]  clobetasol ointment (TEMOVATE) 3.41 % Apply 1 application topically 2 (two) times daily. Patient taking differently: Apply 1 application topically 2 (two) times daily as needed (rash).  12/12/16   Huel Cote, NP  Cyanocobalamin (VITAMIN B-12 PO) Take 1 tablet by mouth every evening.     [provider]  docusate sodium (COLACE) 100 MG capsule Take 1 capsule (100 mg total) by mouth 2 (two) times daily. 01/10/17   Danae Orleans, PA-C  hydrochlorothiazide (HYDRODIURIL) 25 MG tablet Take 12.5 mg by mouth daily.    [provider]  iron polysaccharides (NU-IRON) 150 MG capsule Take  150 mg by mouth daily.    [provider]  levothyroxine (SYNTHROID, LEVOTHROID) 112 MCG tablet Take 112 mcg by mouth daily before breakfast.    [provider]  methocarbamol (ROBAXIN) 500 MG tablet Take 1 tablet (500 mg total) by mouth every 6 (six) hours as needed for muscle spasms. 01/10/17   Danae Orleans, PA-C  oxyCODONE (OXY IR/ROXICODONE) 5 MG immediate release tablet Take 1-3 tablets (5-15 mg total) by mouth every 4 (four) hours as needed for moderate pain or severe pain. 01/10/17   Danae Orleans, PA-C  pantoprazole (PROTONIX) 40 MG tablet Take 40 mg by mouth daily as needed (indigestion).  01/20/16   [provider]  polyethylene glycol (MIRALAX / GLYCOLAX) packet Take 17 g by mouth 2 (two) times daily. 01/10/17   Danae Orleans, PA-C  senna (SENOKOT) 8.6 MG tablet Take 34.4 tablets by mouth daily as needed for constipation.     [provider]   Dg Hip Unilat W Or Wo Pelvis 2-3 Views Left  Result Date: 01/29/2017 CLINICAL DATA:  Left hip pain EXAM: DG HIP (WITH OR WITHOUT PELVIS) 2-3V LEFT COMPARISON:  01/10/2017 FINDINGS: Frontal pelvis with AP and cross-table lateral views of the left hip demonstrate superior left femoral component dislocation in this patient status post left total hip replacement. No evidence for periprosthetic fracture. Bones are diffusely demineralized. Patient also has right total hip replacement. IMPRESSION: Superior dislocation of the femoral component of the left total hip replacement. Electronically Signed   By: Misty Stanley M.D.   On: 01/29/2017 13:36    Positive ROS: All other systems have been reviewed and were otherwise negative with the exception of those mentioned in the HPI and as above.  Physical Exam: General: Alert, no acute distress Cardiovascular: No pedal edema Respiratory: No cyanosis, no use of accessory musculature GI: No organomegaly, abdomen is soft and non-tender Skin: No lesions in the area of chief  complaint Neurologic: Sensation intact distally Psychiatric: Patient is competent for consent with normal mood and affect Lymphatic: No axillary or cervical lymphadenopathy  MUSCULOSKELETAL: Examination of the left hip reveals a healed incision. The extremity is shortened and externally rotated. She is neurovascularly intact distally.  Assessment: Left periprosthetic hip dislocation, first occurrence  Plan: I discussed the findings with the patient and her husband. I recommended closed reduction of the left hip in the emergency department. We discussed that the risks include failure to reduce the hip and recurrent hip instability. After written consent was obtained, conscious sedation was administered by the emergency department staff. I reduced the left hip with longitudinal traction and internal rotation.  There was an audible and palpable clunk. She did sustain a small skin tear over the distal third of the lower  leg during the reduction. Postreduction exam revealed that her hip was stable. Knee immobilizer was placed. Postreduction x-rays revealed successful closed reduction. We discussed hip precautions. We discussed wound care for her skin tear. She will call the office to schedule an appointment with Dr. Alvan Dame in 1-2 weeks.    Glenys Snader, Horald Pollen, MD Cell [phone]    01/29/2017 2:25 PM

## 2017-01-29 NOTE — ED Provider Notes (Signed)
[facility] DEPT Provider Note   CSN: [id number] Arrival date & time: 01/29/17  1230     History   Chief Complaint Chief Complaint  Patient presents with  . Hip Pain    HPI Heidi Adams is a 81 y.o. female.  The history is provided by the patient. No language interpreter was used.  Hip Pain  This is a new problem. The current episode started 1 to 2 hours ago. The problem occurs constantly. The problem has been gradually worsening. Nothing aggravates the symptoms. Nothing relieves the symptoms. She has tried nothing for the symptoms. The treatment provided moderate relief.  Pt reports she was sitting in church and her left hip popped out.  Pt reports she is unable to walk.    Past Medical History:  Diagnosis Date  . Anemia    hx of anemia  . Arthritis   . Cancer ([place])    skin ca nose  . Dysrhythmia    went into Atrial Fibrillation after last surgery  . History of skin cancer   . Hypertension   . PVC (premature ventricular contraction)   . Thyroiditis 1973    Patient Active Problem List   Diagnosis Date Noted  . S/P left THA, AA 01/10/2017  . Overweight (BMI 25.0-29.9) 05/11/2016  . S/P right THA, AA 05/10/2016  . Right hip pain 03/16/2016  . Pes planus 03/16/2016  . Abnormality of gait 03/16/2016  . PAC (premature atrial contraction) 02/24/2016  . PVC (premature ventricular contraction) 02/24/2016  . Palpitations 01/27/2016  . Benign paroxysmal positional vertigo of right ear 12/15/2015  . Pharyngoesophageal dysphagia 12/15/2015  . Hypothyroidism 11/26/2015  . Essential hypertension 11/26/2015  . S/P left TKA 09/28/2015  . Lichen sclerosus 70/35/0093    Past Surgical History:  Procedure Laterality Date  . ABDOMINAL HYSTERECTOMY  90   TAH BSO  . APPENDECTOMY    . CHOLECYSTECTOMY     dr Georgia Lopes  . ELBOW SURGERY    . EYE SURGERY     "on my eyelids" years ago  . KNEE ARTHROSCOPY     left  . THYROIDECTOMY  1973  . TONSILLECTOMY    . TOTAL HIP  ARTHROPLASTY Right 05/10/2016   Procedure: TOTAL HIP ARTHROPLASTY ANTERIOR APPROACH;  Surgeon: Paralee Cancel, MD;  Location: [facility];  Service: Orthopedics;  Laterality: Right;  . TOTAL HIP ARTHROPLASTY Left 01/10/2017   Procedure: LEFT TOTAL HIP ARTHROPLASTY ANTERIOR APPROACH;  Surgeon: Paralee Cancel, MD;  Location: [facility];  Service: Orthopedics;  Laterality: Left;  . TOTAL KNEE ARTHROPLASTY Left 09/28/2015   Procedure: LEFT TOTAL KNEE ARTHROPLASTY;  Surgeon: Paralee Cancel, MD;  Location: [facility];  Service: Orthopedics;  Laterality: Left;  . TOTAL KNEE ARTHROPLASTY Right 08/09/2016   Procedure: RIGHT TOTAL KNEE ARTHROPLASTY;  Surgeon: Paralee Cancel, MD;  Location: [facility];  Service: Orthopedics;  Laterality: Right;  Adductor Block  . Total Left hip arthroplasty     01/10/17 Dr. Alvan Dame    OB History    Gravida Para Term Preterm AB Living   2 2       2    SAB TAB Ectopic Multiple Live Births                   Home Medications    Prior to Admission medications   Medication Sig Start Date End Date Taking? Authorizing Provider  aspirin 81 MG chewable tablet Chew 1 tablet (81 mg total) by mouth 2 (two) times daily. Take for 4 weeks, then  resume regular dose. 01/11/17 02/10/17  Danae Orleans, PA-C  celecoxib (CELEBREX) 200 MG capsule Take 200 mg by mouth daily. 10/18/16   [provider]  Cholecalciferol (VITAMIN D) 2000 units CAPS Take 2,000 Units by mouth daily.    [provider]  clobetasol ointment (TEMOVATE) 7.56 % Apply 1 application topically 2 (two) times daily. Patient taking differently: Apply 1 application topically 2 (two) times daily as needed (rash).  12/12/16   Huel Cote, NP  Cyanocobalamin (VITAMIN B-12 PO) Take 1 tablet by mouth every evening.     [provider]  docusate sodium (COLACE) 100 MG capsule Take 1 capsule (100 mg total) by mouth 2 (two) times daily. 01/10/17   Danae Orleans, PA-C  hydrochlorothiazide (HYDRODIURIL) 25 MG tablet Take 12.5 mg by  mouth daily.    [provider]  iron polysaccharides (NU-IRON) 150 MG capsule Take 150 mg by mouth daily.    [provider]  levothyroxine (SYNTHROID, LEVOTHROID) 112 MCG tablet Take 112 mcg by mouth daily before breakfast.    [provider]  methocarbamol (ROBAXIN) 500 MG tablet Take 1 tablet (500 mg total) by mouth every 6 (six) hours as needed for muscle spasms. 01/10/17   Danae Orleans, PA-C  oxyCODONE (OXY IR/ROXICODONE) 5 MG immediate release tablet Take 1-3 tablets (5-15 mg total) by mouth every 4 (four) hours as needed for moderate pain or severe pain. 01/10/17   Danae Orleans, PA-C  pantoprazole (PROTONIX) 40 MG tablet Take 40 mg by mouth daily as needed (indigestion).  01/20/16   [provider]  polyethylene glycol (MIRALAX / GLYCOLAX) packet Take 17 g by mouth 2 (two) times daily. 01/10/17   Danae Orleans, PA-C  senna (SENOKOT) 8.6 MG tablet Take 34.4 tablets by mouth daily as needed for constipation.     [provider]    Family History Family History  Problem Relation Age of Onset  . Diabetes Father   . Hypertension Father     Social History Social History  Substance Use Topics  . Smoking status: Former Smoker    Quit date: 07/26/1987  . Smokeless tobacco: Never Used  . Alcohol use No     Allergies   Codeine; Epinephrine; and Tenormin [atenolol]   Review of Systems Review of Systems  All other systems reviewed and are negative.    Physical Exam Updated Vital Signs BP (!) 194/93   Pulse 83   Temp 97.9 F (36.6 C)   Resp 16   LMP 07/25/1988 Comment: full   SpO2 97%   Physical Exam  Constitutional: She appears well-developed and well-nourished. No distress.  HENT:  Head: Normocephalic and atraumatic.  Eyes: Conjunctivae are normal.  Neck: Neck supple.  Cardiovascular: Normal rate and regular rhythm.   No murmur heard. Pulmonary/Chest: Effort normal and breath sounds normal. No respiratory distress.    Abdominal: Soft. There is no tenderness.  Musculoskeletal: She exhibits tenderness and deformity. She exhibits no edema.  Left hip  Neurological: She is alert.  Skin: Skin is warm and dry.  Psychiatric: She has a normal mood and affect.  Nursing note and vitals reviewed.    ED Treatments / Results  Labs (all labs ordered are listed, but only abnormal results are displayed) Labs Reviewed - No data to display  EKG  EKG Interpretation None       Radiology Dg Hip Unilat W Or Wo Pelvis 2-3 Views Left  Result Date: 01/29/2017 CLINICAL DATA:  Left hip pain EXAM: DG  HIP (WITH OR WITHOUT PELVIS) 2-3V LEFT COMPARISON:  01/10/2017 FINDINGS: Frontal pelvis with AP and cross-table lateral views of the left hip demonstrate superior left femoral component dislocation in this patient status post left total hip replacement. No evidence for periprosthetic fracture. Bones are diffusely demineralized. Patient also has right total hip replacement. IMPRESSION: Superior dislocation of the femoral component of the left total hip replacement. Electronically Signed   By: Misty Stanley M.D.   On: 01/29/2017 13:36    Procedures Procedures (including critical care time)  Medications Ordered in ED Medications  propofol (DIPRIVAN) 10 mg/mL bolus/IV push 100 mg (not administered)  HYDROmorphone (DILAUDID) injection 1 mg (1 mg Intravenous Given 01/29/17 1358)  ondansetron (ZOFRAN) injection 4 mg (4 mg Intravenous Given 01/29/17 1358)     Initial Impression / Assessment and Plan / ED Course  I have reviewed the triage vital signs and the nursing notes.  Pertinent labs & imaging results that were available during my care of the patient were reviewed by me and considered in my medical decision making (see chart for details).  Clinical Course as of Jan 30 1555  Sun Jan 29, 2017  1550 DG Hip Malvin Johns or Wo Pelvis 2-3 Views Left [LS]    Clinical Course User Index [LS] Fransico Meadow, [place]    Dr. Darl Householder in to  see.   Dr. Krystal Eaton called and reports he will be in to see and reduce.  Pt counseled on xray and reduction.   Conscious sedation by Dr. Darl Householder.  Pt tolerated wll no complications Final Clinical Impressions(s) / ED Diagnoses   Final diagnoses:  Dislocated hip, left, initial encounter [facility])    New Prescriptions New Prescriptions   No medications on file  Pt placed in knee immbolizer.   Pt's xray reviewed with Dr. Lyla Glassing.  Fracture appears to be osteophyte.    Fransico Meadow, PA-C 01/29/17 [place], [place] 01/29/17 1558    Drenda Freeze, MD 01/30/17 [phone]

## 2017-01-29 NOTE — ED Triage Notes (Signed)
She tells [place] she underwent left hip replacement surgery by Dr. Alvan Dame some two weeks ago. She states that as she was sitting in church (no movement nor activity) she simply felt her left hip "pop out". She phoned Dr. Delfino Lovett, who advised her to come here. She arrives having rec'd. 100 mcg of Fentanyl IV.

## 2017-01-29 NOTE — Discharge Instructions (Signed)
See Dr. Olin for recheck  °

## 2017-01-29 NOTE — Sedation Documentation (Signed)
After the first attempt; it was discovered via x-ray that hip was not in, so 2nd attempt was performed with x-ray which confirmed hip back in.

## 2017-01-29 NOTE — ED Notes (Signed)
Bed: HH83 Expected date:  Expected time:  Means of arrival:  Comments: 81 yo hip dislocation

## 2017-01-31 ENCOUNTER — Encounter: Payer: PPO | Admitting: Gynecology

## 2017-02-02 DIAGNOSIS — Z96642 Presence of left artificial hip joint: Secondary | ICD-10-CM | POA: Diagnosis not present

## 2017-02-02 DIAGNOSIS — Z471 Aftercare following joint replacement surgery: Secondary | ICD-10-CM | POA: Diagnosis not present

## 2017-02-02 DIAGNOSIS — S73015S Posterior dislocation of left hip, sequela: Secondary | ICD-10-CM | POA: Diagnosis not present

## 2017-02-03 ENCOUNTER — Encounter (HOSPITAL_COMMUNITY): Payer: Self-pay | Admitting: Emergency Medicine

## 2017-02-03 ENCOUNTER — Emergency Department (HOSPITAL_COMMUNITY): Payer: PPO

## 2017-02-03 ENCOUNTER — Observation Stay (HOSPITAL_COMMUNITY)
Admission: EM | Admit: 2017-02-03 | Discharge: 2017-02-04 | Disposition: A | Discharge status: Home / Self Care | Payer: PPO | Attending: Orthopedic Surgery | Admitting: Orthopedic Surgery

## 2017-02-03 DIAGNOSIS — Z96653 Presence of artificial knee joint, bilateral: Secondary | ICD-10-CM | POA: Insufficient documentation

## 2017-02-03 DIAGNOSIS — S73005A Unspecified dislocation of left hip, initial encounter: Secondary | ICD-10-CM | POA: Diagnosis not present

## 2017-02-03 DIAGNOSIS — T84021A Dislocation of internal left hip prosthesis, initial encounter: Secondary | ICD-10-CM | POA: Diagnosis not present

## 2017-02-03 DIAGNOSIS — Z96643 Presence of artificial hip joint, bilateral: Secondary | ICD-10-CM | POA: Insufficient documentation

## 2017-02-03 DIAGNOSIS — I10 Essential (primary) hypertension: Secondary | ICD-10-CM | POA: Diagnosis not present

## 2017-02-03 DIAGNOSIS — Z96642 Presence of left artificial hip joint: Secondary | ICD-10-CM | POA: Diagnosis not present

## 2017-02-03 DIAGNOSIS — Z85828 Personal history of other malignant neoplasm of skin: Secondary | ICD-10-CM | POA: Diagnosis not present

## 2017-02-03 DIAGNOSIS — Z7982 Long term (current) use of aspirin: Secondary | ICD-10-CM | POA: Diagnosis not present

## 2017-02-03 DIAGNOSIS — T8484XA Pain due to internal orthopedic prosthetic devices, implants and grafts, initial encounter: Secondary | ICD-10-CM | POA: Diagnosis not present

## 2017-02-03 DIAGNOSIS — Y92510 Bank as the place of occurrence of the external cause: Secondary | ICD-10-CM | POA: Insufficient documentation

## 2017-02-03 DIAGNOSIS — Z87891 Personal history of nicotine dependence: Secondary | ICD-10-CM | POA: Diagnosis not present

## 2017-02-03 DIAGNOSIS — Z9889 Other specified postprocedural states: Secondary | ICD-10-CM

## 2017-02-03 DIAGNOSIS — Z471 Aftercare following joint replacement surgery: Secondary | ICD-10-CM | POA: Diagnosis not present

## 2017-02-03 DIAGNOSIS — I493 Ventricular premature depolarization: Secondary | ICD-10-CM | POA: Insufficient documentation

## 2017-02-03 DIAGNOSIS — M25552 Pain in left hip: Secondary | ICD-10-CM | POA: Diagnosis not present

## 2017-02-03 DIAGNOSIS — Y792 Prosthetic and other implants, materials and accessory orthopedic devices associated with adverse incidents: Secondary | ICD-10-CM | POA: Insufficient documentation

## 2017-02-03 DIAGNOSIS — T148XXA Other injury of unspecified body region, initial encounter: Secondary | ICD-10-CM | POA: Diagnosis not present

## 2017-02-03 DIAGNOSIS — Z79899 Other long term (current) drug therapy: Secondary | ICD-10-CM | POA: Diagnosis not present

## 2017-02-03 LAB — CBC
HEMATOCRIT: 33.1 % — AB (ref 36.0–46.0)
HEMOGLOBIN: 10.2 g/dL — AB (ref 12.0–15.0)
MCH: 27.6 pg (ref 26.0–34.0)
MCHC: 30.8 g/dL (ref 30.0–36.0)
MCV: 89.5 fL (ref 78.0–100.0)
Platelets: 239 10*3/uL (ref 150–400)
RBC: 3.7 MIL/uL — ABNORMAL LOW (ref 3.87–5.11)
RDW: 19.3 % — AB (ref 11.5–15.5)
WBC: 7.7 10*3/uL (ref 4.0–10.5)

## 2017-02-03 LAB — BASIC METABOLIC PANEL
ANION GAP: 8 (ref 5–15)
BUN: 15 mg/dL (ref 6–20)
CALCIUM: 8.9 mg/dL (ref 8.9–10.3)
CHLORIDE: 104 mmol/L (ref 101–111)
CO2: 27 mmol/L (ref 22–32)
CREATININE: 0.56 mg/dL (ref 0.44–1.00)
GFR calc Af Amer: >60 mL/min (ref >60)
GFR calc non Af Amer: >60 mL/min (ref >60)
GLUCOSE: 89 mg/dL (ref 65–99)
Potassium: 3.4 mmol/L — ABNORMAL LOW (ref 3.5–5.1)
Sodium: 139 mmol/L (ref 135–145)

## 2017-02-03 IMAGING — DX DG HIP (WITH OR WITHOUT PELVIS) 1V PORT*L*
1 series · 1 of 1 positions shown · non-contrast
Comparison: 01/29/2017

CLINICAL DATA: Left hip pain.

EXAM:
DG HIP (WITH OR WITHOUT PELVIS) 1V PORT LEFT

[hip ap]
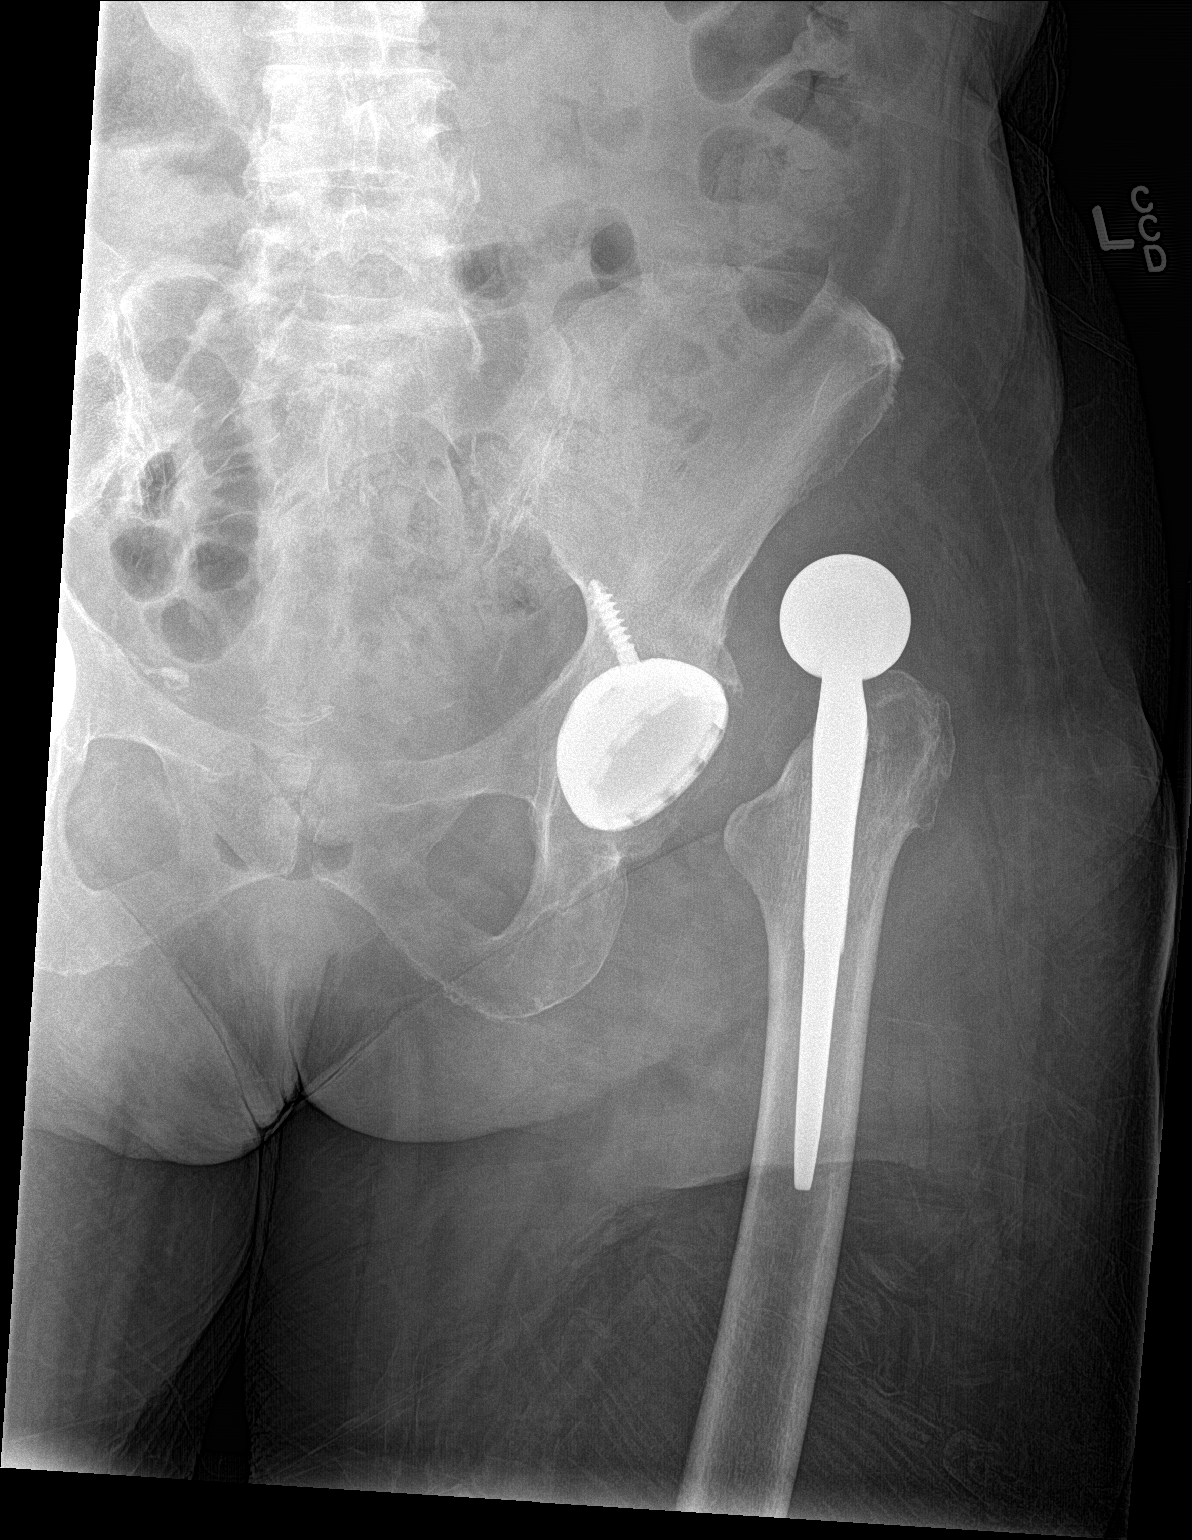

[1 of 1 positions shown; findings below may reference images not displayed]

FINDINGS: There is anterior superolateral dislocation of the femoral component
of the left total hip prosthesis. Acetabular component appears in
good position.
IMPRESSION: Dislocation of the left hip prosthesis as described.

## 2017-02-03 IMAGING — DX DG HIP (WITH OR WITHOUT PELVIS) 1V PORT*L*
3 series · 3 of 3 positions shown · non-contrast
Comparison: One-view left hip from the same day.

CLINICAL DATA: Left hip dislocation and reduction.

EXAM:
DG HIP (WITH OR WITHOUT PELVIS) 1V PORT LEFT

[pelvis ap]
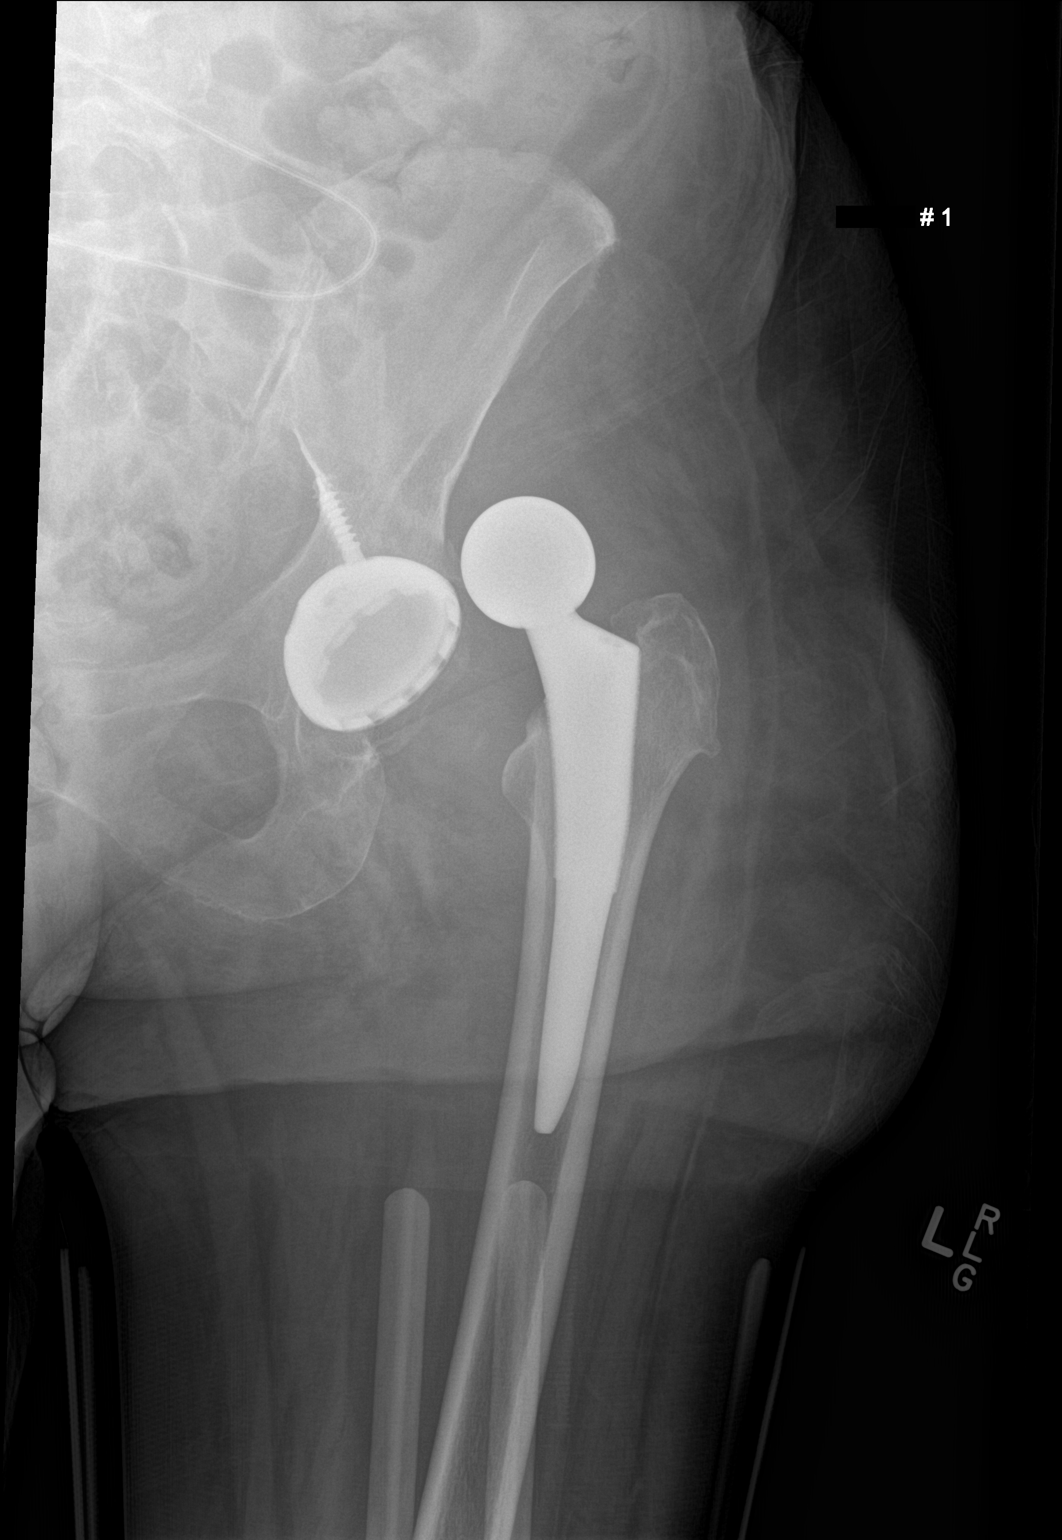

[hip ap (1 of 2)]
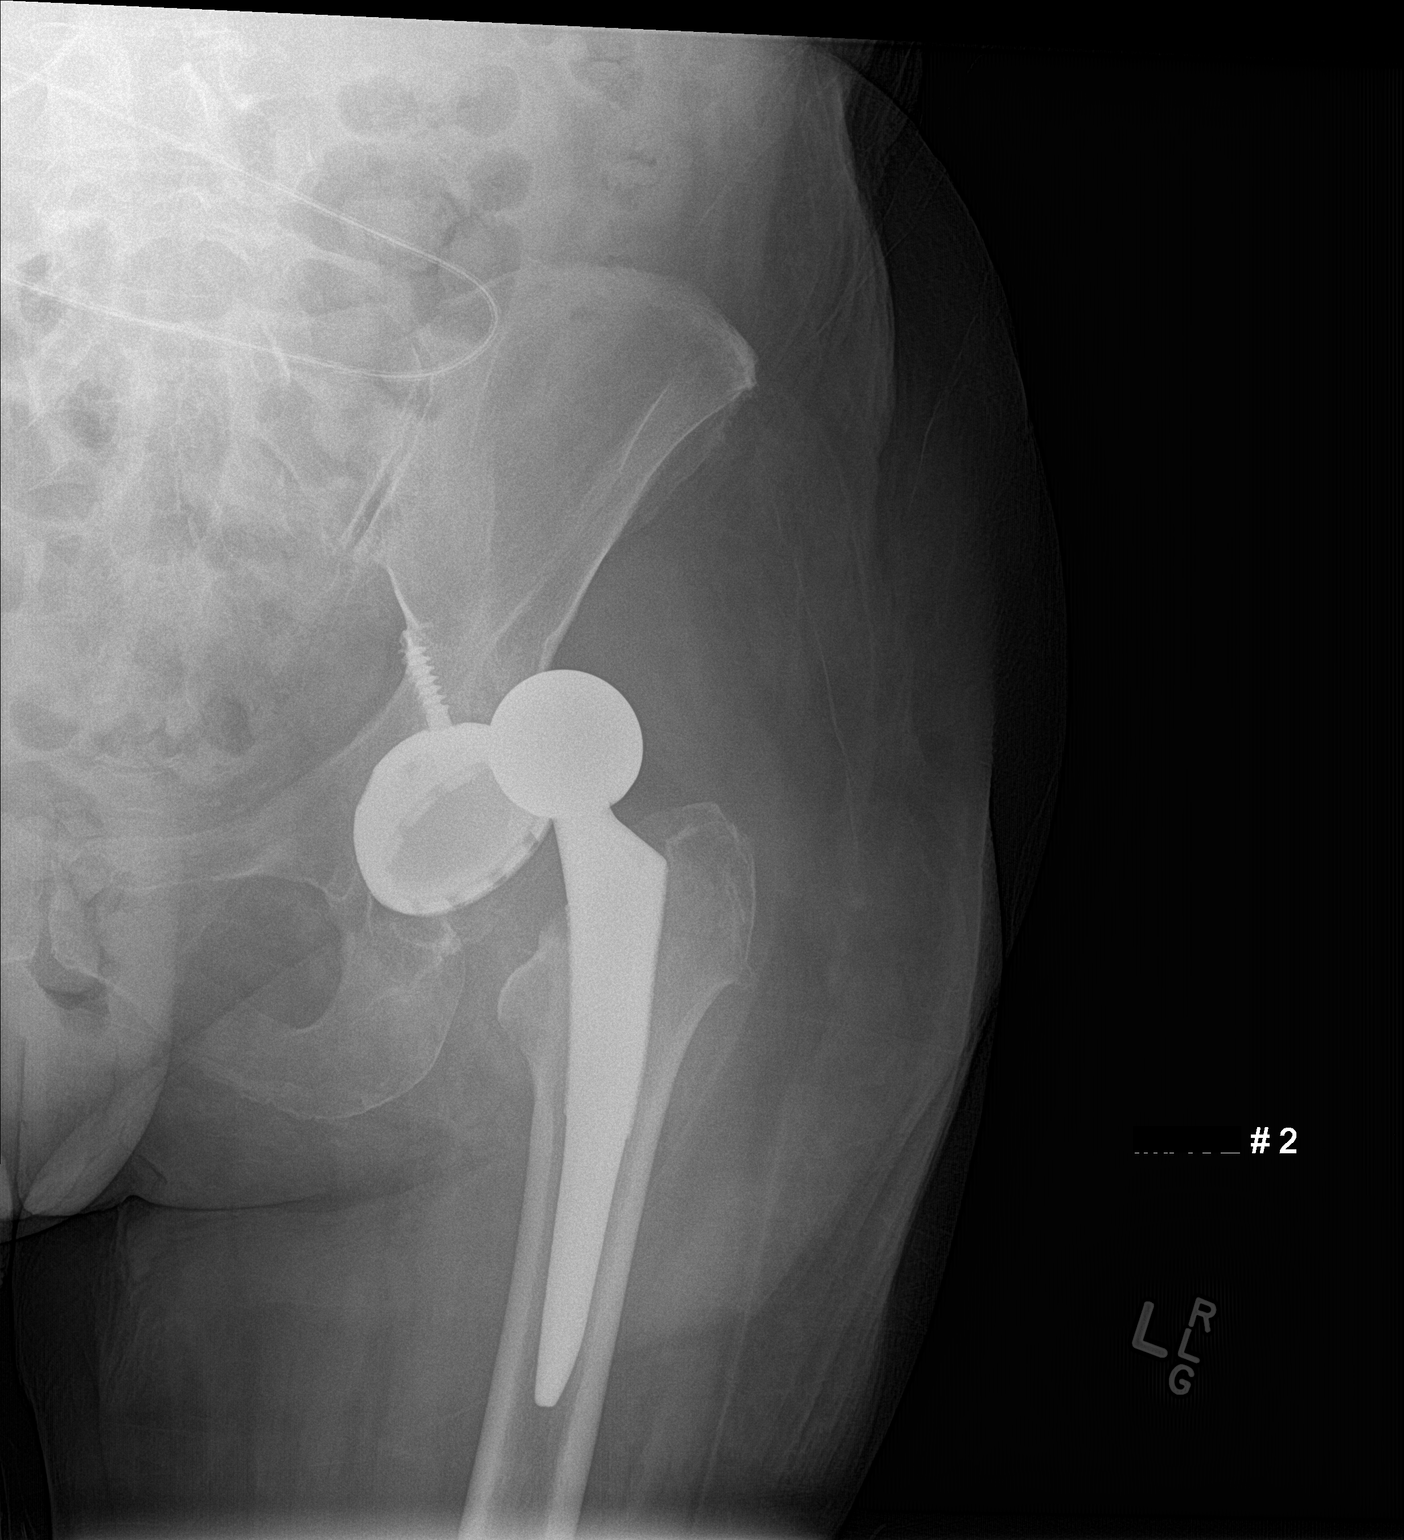

[hip ap (2 of 2)]
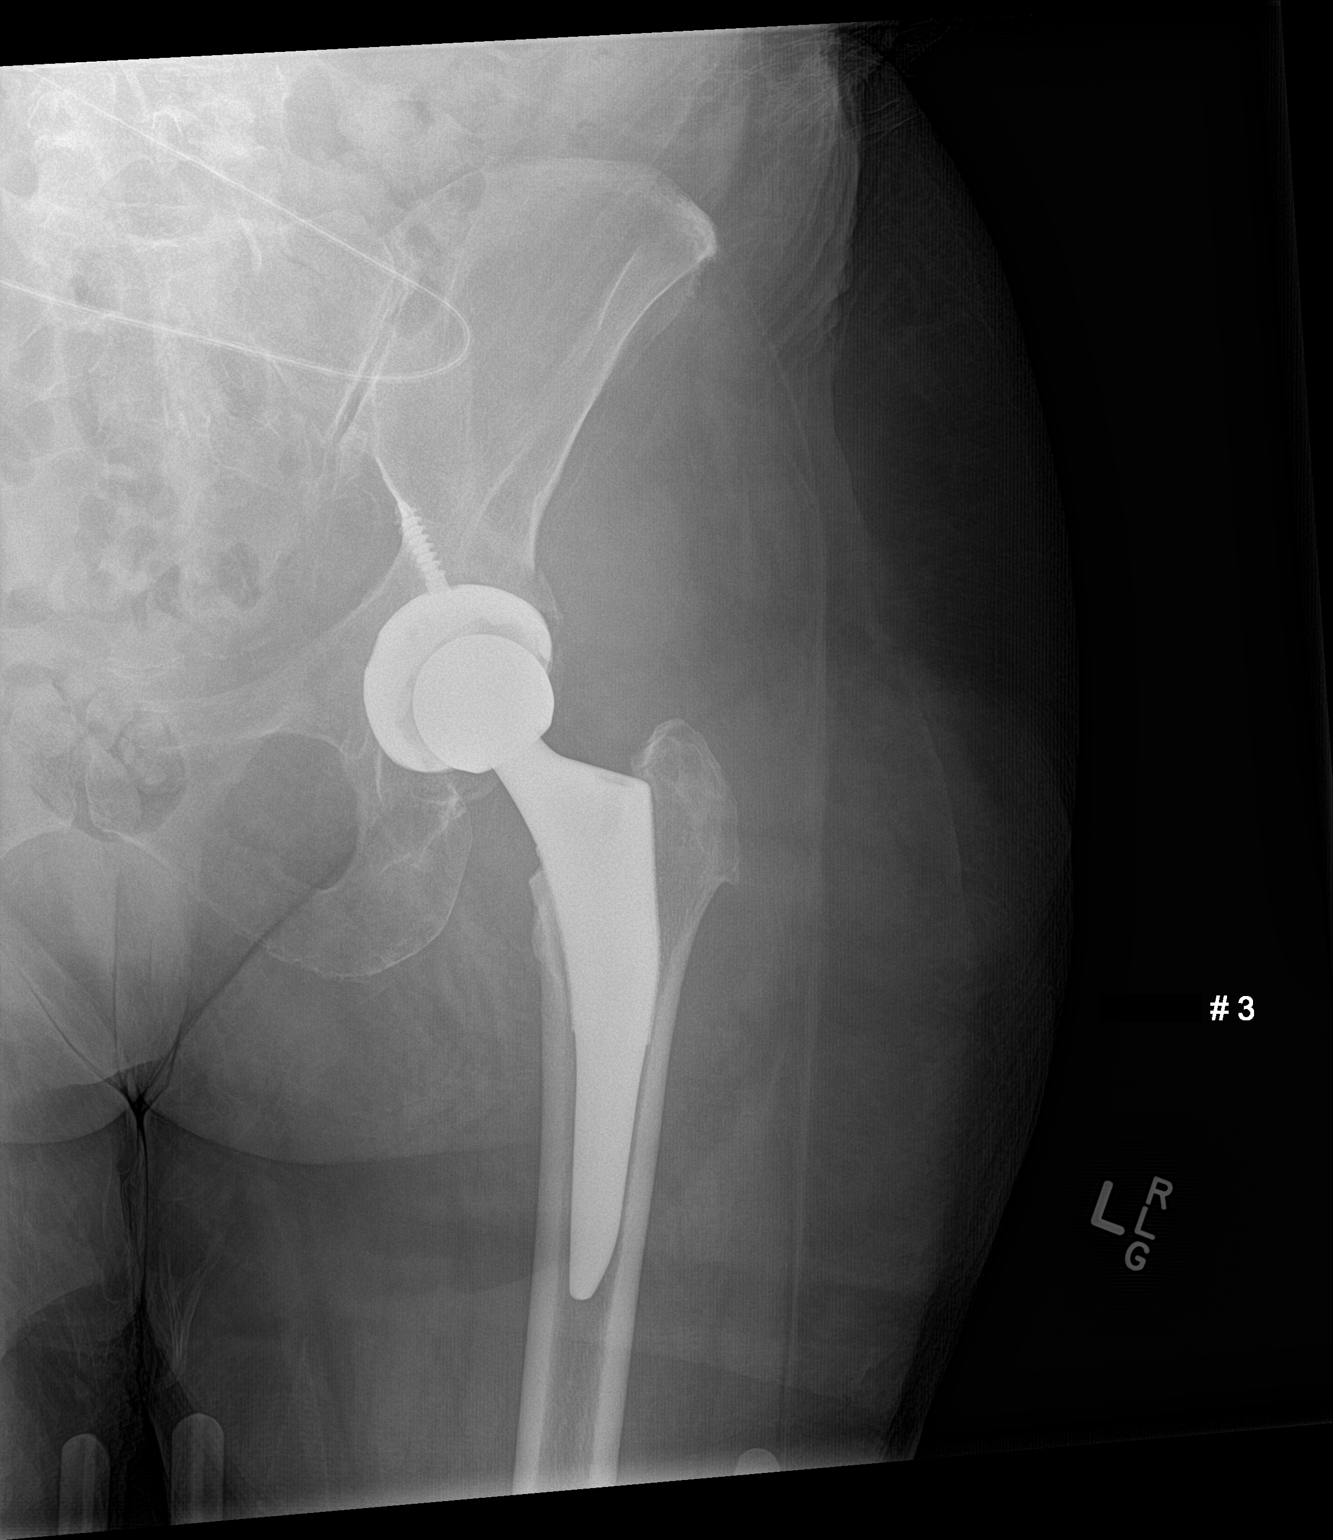

[3 of 3 positions shown; findings below may reference images not displayed]

FINDINGS: 3 AP images of the left hip are submitted. The hip is dislocated on
the first 2 images. The femoral component projects over the
acetabular component on the final image. There is no associated
fracture.
IMPRESSION: 1. Reduction of left hip dislocation without radiographic evidence
for complication.

## 2017-02-03 MED ORDER — FENTANYL CITRATE (PF) 100 MCG/2ML IJ SOLN
50.0000 ug | Freq: Once | INTRAMUSCULAR | Status: AC
  Administered 2017-02-03: 50 ug via INTRAVENOUS
  Filled 2017-02-03: qty 2

## 2017-02-03 MED ORDER — ONDANSETRON HCL 4 MG/2ML IJ SOLN
4.0000 mg | Freq: Once | INTRAMUSCULAR | Status: AC
  Administered 2017-02-03: 4 mg via INTRAVENOUS
  Filled 2017-02-03: qty 2

## 2017-02-03 MED ORDER — ASPIRIN 81 MG PO CHEW
81.0000 mg | CHEWABLE_TABLET | Freq: Two times a day (BID) | ORAL | Status: DC
  Administered 2017-02-04: 81 mg via ORAL
  Filled 2017-02-03: qty 1

## 2017-02-03 MED ORDER — ACETAMINOPHEN 325 MG PO TABS
650.0000 mg | ORAL_TABLET | Freq: Four times a day (QID) | ORAL | Status: DC | PRN
  Administered 2017-02-03: 650 mg via ORAL
  Filled 2017-02-03: qty 2

## 2017-02-03 MED ORDER — DOCUSATE SODIUM 100 MG PO CAPS
100.0000 mg | ORAL_CAPSULE | Freq: Two times a day (BID) | ORAL | Status: DC
  Filled 2017-02-03: qty 1

## 2017-02-03 MED ORDER — PROPOFOL 10 MG/ML IV BOLUS
100.0000 mg | Freq: Once | INTRAVENOUS | Status: AC
Start: 2017-02-03 — End: 2017-02-03
  Administered 2017-02-03: 60 mg via INTRAVENOUS
  Filled 2017-02-03: qty 20

## 2017-02-03 MED ORDER — CELECOXIB 200 MG PO CAPS
200.0000 mg | ORAL_CAPSULE | Freq: Every day | ORAL | Status: DC
  Administered 2017-02-04: 200 mg via ORAL
  Filled 2017-02-03: qty 1

## 2017-02-03 MED ORDER — LEVOTHYROXINE SODIUM 112 MCG PO TABS
112.0000 ug | ORAL_TABLET | Freq: Every day | ORAL | Status: DC
  Administered 2017-02-04: 112 ug via ORAL
  Filled 2017-02-03: qty 1

## 2017-02-03 MED ORDER — ACETAMINOPHEN 650 MG RE SUPP: 650.0000 mg | Freq: Four times a day (QID) | RECTAL | Status: DC | PRN

## 2017-02-03 MED ORDER — POLYETHYLENE GLYCOL 3350 17 G PO PACK
17.0000 g | PACK | Freq: Two times a day (BID) | ORAL | Status: DC
  Filled 2017-02-03: qty 1

## 2017-02-03 MED ORDER — SENNOSIDES 8.6 MG PO TABS
34.4000 mg | ORAL_TABLET | Freq: Every day | ORAL | Status: DC | PRN
  Filled 2017-02-03: qty 4

## 2017-02-03 MED ORDER — OXYCODONE HCL 5 MG PO TABS
5.0000 mg | ORAL_TABLET | ORAL | Status: DC | PRN
  Administered 2017-02-03 (×2): 5 mg via ORAL
  Filled 2017-02-03: qty 1
  Filled 2017-02-03 (×2): qty 2

## 2017-02-03 MED ORDER — HYDROCHLOROTHIAZIDE 25 MG PO TABS
12.5000 mg | ORAL_TABLET | Freq: Every day | ORAL | Status: DC
  Administered 2017-02-04: 12.5 mg via ORAL
  Filled 2017-02-03: qty 1

## 2017-02-03 MED ORDER — VITAMIN D3 25 MCG (1000 UNIT) PO TABS
2000.0000 [IU] | ORAL_TABLET | Freq: Every day | ORAL | Status: DC
  Administered 2017-02-04: 2000 [IU] via ORAL
  Filled 2017-02-03 (×2): qty 2

## 2017-02-03 MED ORDER — PROPOFOL 10 MG/ML IV BOLUS
INTRAVENOUS | Status: AC | PRN
  Administered 2017-02-03: 40 mg via INTRAVENOUS
  Administered 2017-02-03: 20 mg via INTRAVENOUS

## 2017-02-03 MED ORDER — PROPOFOL 10 MG/ML IV BOLUS
INTRAVENOUS | Status: DC | PRN
  Administered 2017-02-03: 20 mg via INTRAVENOUS
  Administered 2017-02-03: 40 mg via INTRAVENOUS

## 2017-02-03 MED ORDER — OXYCODONE-ACETAMINOPHEN 5-325 MG PO TABS: 1.0000 | ORAL_TABLET | Freq: Four times a day (QID) | ORAL | Status: DC | PRN

## 2017-02-03 MED ORDER — SODIUM CHLORIDE 0.9 % IV BOLUS (SEPSIS)
1000.0000 mL | Freq: Once | INTRAVENOUS | Status: AC
  Administered 2017-02-03: 1000 mL via INTRAVENOUS

## 2017-02-03 MED ORDER — PANTOPRAZOLE SODIUM 40 MG PO TBEC: 40.0000 mg | DELAYED_RELEASE_TABLET | Freq: Every day | ORAL | Status: DC | PRN

## 2017-02-03 MED ORDER — METHOCARBAMOL 500 MG PO TABS
500.0000 mg | ORAL_TABLET | Freq: Four times a day (QID) | ORAL | Status: DC | PRN
  Administered 2017-02-03: 500 mg via ORAL
  Filled 2017-02-03: qty 1

## 2017-02-03 NOTE — Sedation Documentation (Signed)
Knee immobilizer placed by ortho tech.  

## 2017-02-03 NOTE — H&P (Signed)
ORTHOPAEDIC CONSULTATION  REQUESTING PHYSICIAN: Nicholes Stairs, MD  PCP:  [name], MD  Chief Complaint: Left hip pain  HPI: Heidi Adams is a 81 y.o. female who complains of left hip pain.  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  She had immediate pain and deformity of the left leg following that event.    Currently she is in the [facility] [facility] ED with confirmed left hip dislocation following left AA THA on 01/10/2017 by Dr. Alvan Dame.  She denies any numbness or paresthesias or motor deficits of the left leg.  Past Medical History:  Diagnosis Date  . Anemia    hx of anemia  . Arthritis   . Cancer ([place])    skin ca nose  . Dysrhythmia    went into Atrial Fibrillation after last surgery  . History of skin cancer   . Hypertension   . PVC (premature ventricular contraction)   . Thyroiditis 1973   Past Surgical History:  Procedure Laterality Date  . ABDOMINAL HYSTERECTOMY  89   TAH BSO  . APPENDECTOMY    . CHOLECYSTECTOMY     dr Georgia Lopes  . ELBOW SURGERY    . EYE SURGERY     "on my eyelids" years ago  . KNEE ARTHROSCOPY     left  . THYROIDECTOMY  1973  . TONSILLECTOMY    . TOTAL HIP ARTHROPLASTY Right 05/10/2016   Procedure: TOTAL HIP ARTHROPLASTY ANTERIOR APPROACH;  Surgeon: Paralee Cancel, MD;  Location: [facility];  Service: Orthopedics;  Laterality: Right;  . TOTAL HIP ARTHROPLASTY Left 01/10/2017   Procedure: LEFT TOTAL HIP ARTHROPLASTY ANTERIOR APPROACH;  Surgeon: Paralee Cancel, MD;  Location: [facility];  Service: Orthopedics;  Laterality: Left;  . TOTAL KNEE ARTHROPLASTY Left 09/28/2015   Procedure: LEFT TOTAL KNEE ARTHROPLASTY;  Surgeon: Paralee Cancel, MD;  Location: [facility];  Service: Orthopedics;  Laterality: Left;  . TOTAL KNEE ARTHROPLASTY Right 08/09/2016   Procedure: RIGHT TOTAL KNEE ARTHROPLASTY;  Surgeon: Paralee Cancel, MD;  Location: [facility];  Service:  Orthopedics;  Laterality: Right;  Adductor Block  . Total Left hip arthroplasty     01/10/17 Dr. Alvan Dame   Social History   Social History  . Marital status: Married    Spouse name: N/A  . Number of children: N/A  . Years of education: N/A   Social History Main Topics  . Smoking status: Former Smoker    Quit date: 07/26/1987  . Smokeless tobacco: Never Used  . Alcohol use No  . Drug use: No  . Sexual activity: Not Currently     Comment: 1st intercourse 80 yo-1 partner   Other Topics Concern  . None   Social History Narrative  . None   Family History  Problem Relation Age of Onset  . Diabetes Father   . Hypertension Father    Allergies  Allergen Reactions  . Codeine Nausea Only  . Epinephrine     Tachcardyia, chest pains tremors  . Tenormin [Atenolol] Rash   Prior to Admission medications   Medication Sig Start Date End Date Taking? Authorizing Provider  aspirin 81 MG chewable tablet Chew 1 tablet (81 mg total) by mouth 2 (two) times daily. Take for 4 weeks, then resume regular dose. 01/11/17 02/10/17 Yes Babish, Rodman Key, PA-C  celecoxib (CELEBREX) 200 MG capsule Take 200 mg by mouth daily. 10/18/16  Yes [provider]  Cholecalciferol (VITAMIN D) 2000 units CAPS Take 2,000 Units by mouth daily.   Yes [provider]  clobetasol ointment (TEMOVATE) 2.40 % Apply 1 application topically 2 (two) times daily. Patient taking differently: Apply 1 application topically 2 (two) times daily as needed (rash).  12/12/16  Yes Huel Cote, NP  Cyanocobalamin (VITAMIN B-12 PO) Take 1 tablet by mouth every evening.    Yes [provider]  hydrochlorothiazide (HYDRODIURIL) 25 MG tablet Take 12.5 mg by mouth daily.   Yes [provider]  iron polysaccharides (NU-IRON) 150 MG capsule Take 150 mg by mouth at bedtime.    Yes [provider]  levothyroxine (SYNTHROID, LEVOTHROID) 112 MCG tablet Take 112 mcg by mouth daily before breakfast.   Yes  [provider]  linaclotide (LINZESS) 72 MCG capsule Take 72 mcg by mouth daily as needed (for constipation).   Yes [provider]  methocarbamol (ROBAXIN) 500 MG tablet Take 1 tablet (500 mg total) by mouth every 6 (six) hours as needed for muscle spasms. 01/10/17  Yes Babish, Rodman Key, PA-C  oxyCODONE (OXY IR/ROXICODONE) 5 MG immediate release tablet Take 1-3 tablets (5-15 mg total) by mouth every 4 (four) hours as needed for moderate pain or severe pain. 01/10/17  Yes Babish, Rodman Key, PA-C  pantoprazole (PROTONIX) 40 MG tablet Take 40 mg by mouth daily as needed (indigestion).  01/20/16  Yes [provider]  senna (SENOKOT) 8.6 MG tablet Take 34.4 tablets by mouth daily as needed for constipation.    Yes [provider]  docusate sodium (COLACE) 100 MG capsule Take 1 capsule (100 mg total) by mouth 2 (two) times daily. Patient not taking: Reported on 02/03/2017 01/10/17   Danae Orleans, PA-C  oxyCODONE-acetaminophen (PERCOCET/ROXICET) 5-325 MG tablet Take 1 tablet by mouth every 6 (six) hours as needed for severe pain. Patient not taking: Reported on 02/03/2017 01/29/17   Caryl Ada K, PA-C  polyethylene glycol [facility] / GLYCOLAX) packet Take 17 g by mouth 2 (two) times daily. Patient not taking: Reported on 02/03/2017 01/10/17   Danae Orleans, PA-C   Dg Hip Port Unilat With Pelvis 1v Left  Result Date: 02/03/2017 CLINICAL DATA:  Left hip pain. EXAM: DG HIP (WITH OR WITHOUT PELVIS) 1V PORT LEFT COMPARISON:  01/29/2017 FINDINGS: There is anterior superolateral dislocation of the femoral component of the left total hip prosthesis. Acetabular component appears in good position. IMPRESSION: Dislocation of the left hip prosthesis as described. Electronically Signed   By: Lorriane Shire M.D.   On: 02/03/2017 13:14    Positive ROS: All other systems have been reviewed and were otherwise negative with the exception of those mentioned in the HPI and as  above.  Physical Exam: General: Alert, no acute distress Cardiovascular: No pedal edema Respiratory: No cyanosis, no use of accessory musculature GI: No organomegaly, abdomen is soft and non-tender Skin: No lesions in the area of chief complaint Neurologic: Sensation intact distally Psychiatric: Patient is competent for consent with normal mood and affect Lymphatic: No axillary or cervical lymphadenopathy  MUSCULOSKELETAL: Examination of the left hip reveals a healed incision. The extremity is shortened and externally rotated. She is neurovascularly intact distally.  Assessment: Left periprosthetic hip dislocation, second occurrence  Plan: I discussed the findings with the patient and her husband. I recommended closed reduction of the left hip in the emergency department. We discussed that the risks include failure to reduce the hip and recurrent hip instability. After written consent was  obtained, conscious sedation was administered by the emergency department staff. On first attempt we were unsuccessful, however on second attempt, I reduced the left hip with longitudinal traction and internal rotation and flexion of the hip.  There was an audible and palpable clunk. There were no immediate procedural complications during the procedure or immediately afterwards.  Postreduction exam revealed that her hip was stable. Knee immobilizer was placed. Postreduction x-rays revealed successful closed reduction. We discussed hip precautions.   I have discussed her case with Dr. Alvan Dame who would prefer an observation admission for PT to assess her once in the am to ensure she is ambulating safely, and then dc back to her home tomorrow am.  Nicholes Stairs, MD Cell [phone]    02/03/2017 4:34 PM

## 2017-02-03 NOTE — ED Provider Notes (Signed)
[facility] DEPT Provider Note   CSN: [id number] Arrival date & time: 02/03/17  1135     History   Chief Complaint Chief Complaint  Patient presents with  . Hip Pain    HPI Heidi Adams is a 81 y.o. female.  HPI   81 yo F with PMHx as below here with dislocated hip. Pt underwent THR with Dr.Olin on 01/10/17. On 7/8, she was seen here and tx for hip dislocation. Saw Dr. Alvan Dame yesterday. She was doing well until just prior to arrival. Pt was at the bank and sat down, with her legs flexed. She moved her leg to the outside (abducted at flexed hip) then felt a pop and severe pain. She tried to stand up but was unable to do so, so she had to call EMS. Reports severe pain in left hip that is aching and throbbing, worse with any movement or palpation. No alleviating factors. No numbness or weakness. Denies any falls or trauma.  Past Medical History:  Diagnosis Date  . Anemia    hx of anemia  . Arthritis   . Cancer ([place])    skin ca nose  . Dysrhythmia    went into Atrial Fibrillation after last surgery  . History of skin cancer   . Hypertension   . PVC (premature ventricular contraction)   . Thyroiditis 1973    Patient Active Problem List   Diagnosis Date Noted  . S/P left THA, AA 01/10/2017  . Overweight (BMI 25.0-29.9) 05/11/2016  . S/P right THA, AA 05/10/2016  . Right hip pain 03/16/2016  . Pes planus 03/16/2016  . Abnormality of gait 03/16/2016  . PAC (premature atrial contraction) 02/24/2016  . PVC (premature ventricular contraction) 02/24/2016  . Palpitations 01/27/2016  . Benign paroxysmal positional vertigo of right ear 12/15/2015  . Pharyngoesophageal dysphagia 12/15/2015  . Hypothyroidism 11/26/2015  . Essential hypertension 11/26/2015  . S/P left TKA 09/28/2015  . Lichen sclerosus 48/07/6551    Past Surgical History:  Procedure Laterality Date  . ABDOMINAL HYSTERECTOMY  89   TAH BSO  . APPENDECTOMY    . CHOLECYSTECTOMY     dr Georgia Lopes  . ELBOW SURGERY     . EYE SURGERY     "on my eyelids" years ago  . KNEE ARTHROSCOPY     left  . THYROIDECTOMY  1973  . TONSILLECTOMY    . TOTAL HIP ARTHROPLASTY Right 05/10/2016   Procedure: TOTAL HIP ARTHROPLASTY ANTERIOR APPROACH;  Surgeon: Paralee Cancel, MD;  Location: [facility];  Service: Orthopedics;  Laterality: Right;  . TOTAL HIP ARTHROPLASTY Left 01/10/2017   Procedure: LEFT TOTAL HIP ARTHROPLASTY ANTERIOR APPROACH;  Surgeon: Paralee Cancel, MD;  Location: [facility];  Service: Orthopedics;  Laterality: Left;  . TOTAL KNEE ARTHROPLASTY Left 09/28/2015   Procedure: LEFT TOTAL KNEE ARTHROPLASTY;  Surgeon: Paralee Cancel, MD;  Location: [facility];  Service: Orthopedics;  Laterality: Left;  . TOTAL KNEE ARTHROPLASTY Right 08/09/2016   Procedure: RIGHT TOTAL KNEE ARTHROPLASTY;  Surgeon: Paralee Cancel, MD;  Location: [facility];  Service: Orthopedics;  Laterality: Right;  Adductor Block  . Total Left hip arthroplasty     01/10/17 Dr. Alvan Dame    OB History    Gravida Para Term Preterm AB Living   2 2       2    SAB TAB Ectopic Multiple Live Births                   Home Medications    Prior  to Admission medications   Medication Sig Start Date End Date Taking? Authorizing Provider  aspirin 81 MG chewable tablet Chew 1 tablet (81 mg total) by mouth 2 (two) times daily. Take for 4 weeks, then resume regular dose. 01/11/17 02/10/17 Yes Babish, Rodman Key, PA-C  celecoxib (CELEBREX) 200 MG capsule Take 200 mg by mouth daily. 10/18/16  Yes [provider]  Cholecalciferol (VITAMIN D) 2000 units CAPS Take 2,000 Units by mouth daily.   Yes [provider]  clobetasol ointment (TEMOVATE) 3.41 % Apply 1 application topically 2 (two) times daily. Patient taking differently: Apply 1 application topically 2 (two) times daily as needed (rash).  12/12/16  Yes Huel Cote, NP  Cyanocobalamin (VITAMIN B-12 PO) Take 1 tablet by mouth every evening.    Yes [provider]  hydrochlorothiazide (HYDRODIURIL) 25 MG  tablet Take 12.5 mg by mouth daily.   Yes [provider]  iron polysaccharides (NU-IRON) 150 MG capsule Take 150 mg by mouth at bedtime.    Yes [provider]  levothyroxine (SYNTHROID, LEVOTHROID) 112 MCG tablet Take 112 mcg by mouth daily before breakfast.   Yes [provider]  linaclotide (LINZESS) 72 MCG capsule Take 72 mcg by mouth daily as needed (for constipation).   Yes [provider]  methocarbamol (ROBAXIN) 500 MG tablet Take 1 tablet (500 mg total) by mouth every 6 (six) hours as needed for muscle spasms. 01/10/17  Yes Babish, Rodman Key, PA-C  oxyCODONE (OXY IR/ROXICODONE) 5 MG immediate release tablet Take 1-3 tablets (5-15 mg total) by mouth every 4 (four) hours as needed for moderate pain or severe pain. 01/10/17  Yes Babish, Rodman Key, PA-C  pantoprazole (PROTONIX) 40 MG tablet Take 40 mg by mouth daily as needed (indigestion).  01/20/16  Yes [provider]  senna (SENOKOT) 8.6 MG tablet Take 34.4 tablets by mouth daily as needed for constipation.    Yes [provider]  docusate sodium (COLACE) 100 MG capsule Take 1 capsule (100 mg total) by mouth 2 (two) times daily. Patient not taking: Reported on 02/03/2017 01/10/17   Danae Orleans, PA-C  oxyCODONE-acetaminophen (PERCOCET/ROXICET) 5-325 MG tablet Take 1 tablet by mouth every 6 (six) hours as needed for severe pain. Patient not taking: Reported on 02/03/2017 01/29/17   Caryl Ada K, PA-C  polyethylene glycol [facility] / GLYCOLAX) packet Take 17 g by mouth 2 (two) times daily. Patient not taking: Reported on 02/03/2017 01/10/17   Danae Orleans, PA-C    Family History Family History  Problem Relation Age of Onset  . Diabetes Father   . Hypertension Father     Social History Social History  Substance Use Topics  . Smoking status: Former Smoker    Quit date: 07/26/1987  . Smokeless tobacco: Never Used  . Alcohol use No     Allergies   Codeine; Epinephrine; and Tenormin  [atenolol]   Review of Systems Review of Systems  Constitutional: Negative for chills, fatigue and fever.  HENT: Negative for congestion, rhinorrhea and sore throat.   Eyes: Negative for visual disturbance.  Respiratory: Negative for cough, shortness of breath and wheezing.   Cardiovascular: Negative for chest pain and leg swelling.  Gastrointestinal: Negative for abdominal pain, diarrhea, nausea and vomiting.  Genitourinary: Negative for dysuria, flank pain, pelvic pain, vaginal bleeding and vaginal discharge.  Musculoskeletal: Positive for arthralgias and gait problem. Negative for neck pain.  Skin: Negative for rash.  Allergic/Immunologic: Negative for immunocompromised state.  Neurological: Negative for syncope and headaches.  Hematological:  Does not bruise/bleed easily.  All other systems reviewed and are negative.    Physical Exam Updated Vital Signs BP (!) 174/72   Pulse 74   Temp 98.4 F (36.9 C) (Oral)   Resp 13   Ht 5\' 3"  (1.6 m)   Wt 63.5 kg (140 lb)   LMP 07/25/1988 Comment: full   SpO2 100%   BMI 24.80 kg/m   Physical Exam  Constitutional: She is oriented to person, place, and time. She appears well-developed and well-nourished. No distress.  HENT:  Head: Normocephalic and atraumatic.  Eyes: Conjunctivae are normal.  Neck: Neck supple.  Cardiovascular: Normal rate, regular rhythm and normal heart sounds.  Exam reveals no friction rub.   No murmur heard. Pulmonary/Chest: Effort normal and breath sounds normal. No respiratory distress. She has no wheezes. She has no rales.  Abdominal: She exhibits no distension.  Musculoskeletal: She exhibits deformity. She exhibits no edema.  Neurological: She is alert and oriented to person, place, and time. She exhibits normal muscle tone.  Skin: Skin is warm. Capillary refill takes less than 2 seconds.  Psychiatric: She has a normal mood and affect.  Nursing note and vitals reviewed.   LOWER EXTREMITY EXAM:  Left  INSPECTION & PALPATION: Obvious deformity to left hip with internal rotation and shortening. Exquisite TTP throughout hip. Surgical wounds appear c/d/i, no breakdown or erythema.  SENSORY: sensation is intact to light touch in:  Superficial peroneal nerve distribution (over dorsum of foot) Deep peroneal nerve distribution (over first dorsal web space) Sural nerve distribution (over lateral aspect 5th metatarsal) Saphenous nerve distribution (over medial instep)  MOTOR:  + Motor EHL (great toe dorsiflexion) + FHL (great toe plantar flexion)  + TA (ankle dorsiflexion)  + GSC (ankle plantar flexion)  VASCULAR: 2+ dorsalis pedis and posterior tibialis pulses Capillary refill < 2 sec, toes warm and well-perfused  COMPARTMENTS: Soft, warm, well-perfused No pain with passive extension No parethesias   ED Treatments / Results  Labs (all labs ordered are listed, but only abnormal results are displayed) Labs Reviewed - No data to display  EKG  EKG Interpretation None       Radiology Dg Hip Port Unilat With Pelvis 1v Left  Result Date: 02/03/2017 CLINICAL DATA:  Left hip pain. EXAM: DG HIP (WITH OR WITHOUT PELVIS) 1V PORT LEFT COMPARISON:  01/29/2017 FINDINGS: There is anterior superolateral dislocation of the femoral component of the left total hip prosthesis. Acetabular component appears in good position. IMPRESSION: Dislocation of the left hip prosthesis as described. Electronically Signed   By: Lorriane Shire M.D.   On: 02/03/2017 13:14    Procedures .Sedation Date/Time: 02/03/2017 7:43 PM Performed by: Duffy Bruce Authorized by: Duffy Bruce   Consent:    Consent obtained:  Verbal   Consent given by:  Patient   Risks discussed:  Inadequate sedation, nausea, dysrhythmia, prolonged hypoxia resulting in organ damage, prolonged sedation necessitating reversal, respiratory compromise necessitating ventilatory assistance and intubation, vomiting and allergic  reaction   Alternatives discussed:  Analgesia without sedation Indications:    Procedure performed:  Dislocation reduction   Procedure necessitating sedation performed by:  Different physician   Intended level of sedation:  Moderate (conscious sedation) Pre-sedation assessment:    Time since last food or drink:  4 hours   ASA classification: class 2 - patient with mild systemic disease     Neck mobility: normal     Mouth opening:  3 or more finger widths   Thyromental distance:  4 finger widths   Mallampati score:  I - soft palate, uvula, fauces, pillars visible   Pre-sedation assessments completed and reviewed: airway patency, cardiovascular function, hydration status, mental status, nausea/vomiting, pain level, respiratory function and temperature     History of difficult intubation: no   Immediate pre-procedure details:    Reviewed: vital signs, relevant labs/tests and NPO status     Verified: bag valve mask available, emergency equipment available, intubation equipment available, IV patency confirmed and oxygen available   Procedure details (see MAR for exact dosages):    Preoxygenation:  Nasal cannula   Sedation:  Propofol   Intra-procedure monitoring:  Continuous capnometry, continuous pulse oximetry, frequent vital sign checks, frequent LOC assessments, blood pressure monitoring and cardiac monitor   Intra-procedure events: none   Post-procedure details:    Attendance: Constant attendance by certified staff until patient recovered     Recovery: Patient returned to pre-procedure baseline     Post-sedation assessments completed and reviewed: airway patency, cardiovascular function, hydration status, mental status, nausea/vomiting and pain level     Patient is stable for discharge or admission: yes     Patient tolerance:  Tolerated well, no immediate complications Comments:     Propofol bolus of 60 mg given initially with excellent effect. Attempted reduction by Dr. Enrigue Catena total of additional 60 mg given for second attempt (approx 15 min after initial reduction) .Tolerated very well with no hypoxia, no hypotension. Pt awake, alert shortly after successful reduction. Boluses given in 20 mg aliquots with reassessment after each bolus to ensur airway patency/maintenance.   (including critical care time)  Medications Ordered in ED Medications  fentaNYL (SUBLIMAZE) injection 50 mcg (50 mcg Intravenous Given 02/03/17 1255)  ondansetron (ZOFRAN) injection 4 mg (4 mg Intravenous Given 02/03/17 1255)     Initial Impression / Assessment and Plan / ED Course  I have reviewed the triage vital signs and the nursing notes.  Pertinent labs & imaging results that were available during my care of the patient were reviewed by me and considered in my medical decision making (see chart for details).     81 yo F with PMHx as above here with recurrent anterior dislocation of right hip. Discussed with Dr. Alvan Dame, pt's surgeon, and reduction performed by Dr. Stann Mainland, his partner, at bedside following conscious sedation by myself. Pt tolerated well - initial attempt showed subluxation so repeat attempt performed with sucessful reduction. Pt o/w at her baseline state of health. She is NVI distal to dislocation. Admit to ortho.  Final Clinical Impressions(s) / ED Diagnoses   Final diagnoses:  Hip dislocation, left [facility])    New Prescriptions New Prescriptions   No medications on file     Duffy Bruce, MD 02/03/17 [id number]

## 2017-02-03 NOTE — ED Notes (Signed)
Dr. Rogers at bedside

## 2017-02-03 NOTE — ED Notes (Signed)
Bed: WA06 Expected date:  Expected time:  Means of arrival:  Comments: EMS-hip dislocation 

## 2017-02-03 NOTE — ED Notes (Signed)
Family at bedside. 

## 2017-02-03 NOTE — ED Triage Notes (Signed)
Per EMS, patient reports left hip "popped out" while sitting down this morning. Reports hip replacement in June. Seen for same x5 days ago.  20G R AC 263mcg Fentanyl with EMS  BP 160/82 HR 90 O2 97%

## 2017-02-03 NOTE — Sedation Documentation (Signed)
Portable XR confirmation of placement.

## 2017-02-03 NOTE — ED Notes (Addendum)
Ortho and respiratory made aware of conscious sedation.

## 2017-02-03 NOTE — ED Notes (Addendum)
Patient requesting pain medication be held until right before XR.

## 2017-02-03 NOTE — ED Notes (Signed)
XR at bedside

## 2017-02-04 NOTE — Progress Notes (Signed)
Pt discharged to home; dc instructions given and explained in detail. Confirmed per pt's request with Dian Situ PA that okay for pt to take Abduction Brace off at night and when showering. Pt verbalizes understanding of dc instructions and brace education. Dc home with spouse.

## 2017-02-04 NOTE — Progress Notes (Signed)
PT Cancellation Note  Patient Details Name: NISHAT LIVINGSTON MRN: [id number] DOB: 16-Mar-1936   Cancelled Treatment:     PT order received but eval deferred to arrival of abductor splint.   Carleena Mires 02/04/2017, 12:39 PM

## 2017-02-04 NOTE — Discharge Summary (Signed)
Physician Discharge Summary   Patient ID: Heidi Adams MRN: [id number] DOB/AGE: 79-04-1936 79 y.o.  Admit date: 02/03/2017 Discharge date: 02/04/2017  Primary Diagnosis:  Left prosthetic hip dislocation, second occurrence Admission Diagnoses:  Past Medical History:  Diagnosis Date  . Anemia    hx of anemia  . Arthritis   . Cancer ([place])    skin ca nose  . Dysrhythmia    went into Atrial Fibrillation after last surgery  . History of skin cancer   . Hypertension   . PVC (premature ventricular contraction)   . Thyroiditis 1973   Discharge Diagnoses:   Active Problems:   S/P closed reduction of dislocated total hip prosthesis  Estimated body mass index is 24.8 kg/m as calculated from the following:   Height as of this encounter: _0  (1.6 m).   Weight as of this encounter: 63.5 kg (140 lb).  Procedure: Closed Reduction of Left Hip by Dr. Stann Mainland  following conscious sedation    Consults: None  HPI: 80 yo F with PMHx as below here with dislocated hip. Pt underwent THR with Dr.Olin on 01/10/17. On 7/8, she was seen here and tx for hip dislocation. Saw Dr. Alvan Dame yesterday. She was doing well until just prior to arrival. Pt was at the bank and sat down, with her legs flexed. She moved her leg to the outside (abducted at flexed hip) then felt a pop and severe pain. She tried to stand up but was unable to do so, so she had to call EMS. Reports severe pain in left hip that is aching and throbbing, worse with any movement or palpation. No alleviating factors. No numbness or weakness. Denies any falls or trauma.   Laboratory Data: Admission on 02/03/2017  Component Date Value Ref Range Status  . WBC 02/03/2017 7.7  4.0 - 10.5 K/uL Final  . RBC 02/03/2017 3.70* 3.87 - 5.11 MIL/uL Final  . Hemoglobin 02/03/2017 10.2* 12.0 - 15.0 g/dL Final  . HCT 02/03/2017 33.1* 36.0 - 46.0 % Final  . MCV 02/03/2017 89.5  78.0 - 100.0 fL Final  . MCH 02/03/2017 27.6  26.0 - 34.0 pg Final  . MCHC  02/03/2017 30.8  30.0 - 36.0 g/dL Final  . RDW 02/03/2017 19.3* 11.5 - 15.5 % Final  . Platelets 02/03/2017 239  150 - 400 K/uL Final  . Sodium 02/03/2017 139  135 - 145 mmol/L Final  . Potassium 02/03/2017 3.4* 3.5 - 5.1 mmol/L Final  . Chloride 02/03/2017 104  101 - 111 mmol/L Final  . CO2 02/03/2017 27  22 - 32 mmol/L Final  . Glucose, Bld 02/03/2017 89  65 - 99 mg/dL Final  . BUN 02/03/2017 15  6 - 20 mg/dL Final  . Creatinine, Ser 02/03/2017 0.56  0.44 - 1.00 mg/dL Final  . Calcium 02/03/2017 8.9  8.9 - 10.3 mg/dL Final  . GFR calc non Af Amer 02/03/2017 >60  >60 mL/min Final  . GFR calc Af Amer 02/03/2017 >60  >60 mL/min Final   Comment: (NOTE) The eGFR has been calculated using the CKD EPI equation. This calculation has not been validated in all clinical situations. eGFR's persistently <60 mL/min signify possible Chronic Kidney Disease.   . Anion gap 02/03/2017 8  5 - 15 Final  Admission on 01/10/2017, Discharged on 01/11/2017  Component Date Value Ref Range Status  . WBC 01/11/2017 11.4* 4.0 - 10.5 K/uL Final  . RBC 01/11/2017 3.67* 3.87 - 5.11 MIL/uL Final  . Hemoglobin 01/11/2017 9.4*  12.0 - 15.0 g/dL Final  . HCT 01/11/2017 30.2* 36.0 - 46.0 % Final  . MCV 01/11/2017 82.3  78.0 - 100.0 fL Final  . MCH 01/11/2017 25.6* 26.0 - 34.0 pg Final  . MCHC 01/11/2017 31.1  30.0 - 36.0 g/dL Final  . RDW 01/11/2017 18.6* 11.5 - 15.5 % Final  . Platelets 01/11/2017 266  150 - 400 K/uL Final  . Sodium 01/11/2017 139  135 - 145 mmol/L Final  . Potassium 01/11/2017 3.7  3.5 - 5.1 mmol/L Final  . Chloride 01/11/2017 104  101 - 111 mmol/L Final  . CO2 01/11/2017 26  22 - 32 mmol/L Final  . Glucose, Bld 01/11/2017 107* 65 - 99 mg/dL Final  . BUN 01/11/2017 16  6 - 20 mg/dL Final  . Creatinine, Ser 01/11/2017 0.50  0.44 - 1.00 mg/dL Final  . Calcium 01/11/2017 8.8* 8.9 - 10.3 mg/dL Final  . GFR calc non Af Amer 01/11/2017 >60  >60 mL/min Final  . GFR calc Af Amer 01/11/2017 >60  >60  mL/min Final   Comment: (NOTE) The eGFR has been calculated using the CKD EPI equation. This calculation has not been validated in all clinical situations. eGFR's persistently <60 mL/min signify possible Chronic Kidney Disease.   . Anion gap 01/11/2017 9  5 - 15 Final  . Color, Urine 01/11/2017 STRAW* YELLOW Final  . APPearance 01/11/2017 CLEAR  CLEAR Final  . Specific Gravity, Urine 01/11/2017 1.006  1.005 - 1.030 Final  . pH 01/11/2017 6.0  5.0 - 8.0 Final  . Glucose, UA 01/11/2017 NEGATIVE  NEGATIVE mg/dL Final  . Hgb urine dipstick 01/11/2017 NEGATIVE  NEGATIVE Final  . Bilirubin Urine 01/11/2017 NEGATIVE  NEGATIVE Final  . Ketones, ur 01/11/2017 NEGATIVE  NEGATIVE mg/dL Final  . Protein, ur 01/11/2017 NEGATIVE  NEGATIVE mg/dL Final  . Nitrite 01/11/2017 NEGATIVE  NEGATIVE Final  . Leukocytes, UA 01/11/2017 NEGATIVE  NEGATIVE Final  Hospital Outpatient Visit on 01/04/2017  Component Date Value Ref Range Status  . WBC 01/04/2017 8.6  4.0 - 10.5 K/uL Final  . RBC 01/04/2017 4.12  3.87 - 5.11 MIL/uL Final  . Hemoglobin 01/04/2017 10.5* 12.0 - 15.0 g/dL Final  . HCT 01/04/2017 34.7* 36.0 - 46.0 % Final  . MCV 01/04/2017 84.2  78.0 - 100.0 fL Final  . MCH 01/04/2017 25.5* 26.0 - 34.0 pg Final  . MCHC 01/04/2017 30.3  30.0 - 36.0 g/dL Final  . RDW 01/04/2017 19.4* 11.5 - 15.5 % Final  . Platelets 01/04/2017 291  150 - 400 K/uL Final  . Sodium 01/04/2017 144  135 - 145 mmol/L Final  . Potassium 01/04/2017 3.5  3.5 - 5.1 mmol/L Final  . Chloride 01/04/2017 106  101 - 111 mmol/L Final  . CO2 01/04/2017 29  22 - 32 mmol/L Final  . Glucose, Bld 01/04/2017 94  65 - 99 mg/dL Final  . BUN 01/04/2017 22* 6 - 20 mg/dL Final  . Creatinine, Ser 01/04/2017 0.73  0.44 - 1.00 mg/dL Final  . Calcium 01/04/2017 9.2  8.9 - 10.3 mg/dL Final  . GFR calc non Af Amer 01/04/2017 >60  >60 mL/min Final  . GFR calc Af Amer 01/04/2017 >60  >60 mL/min Final   Comment: (NOTE) The eGFR has been calculated  using the CKD EPI equation. This calculation has not been validated in all clinical situations. eGFR's persistently <60 mL/min signify possible Chronic Kidney Disease.   . Anion gap 01/04/2017 9  5 - 15 Final  .  MRSA, PCR 01/04/2017 NEGATIVE  NEGATIVE Final  . Staphylococcus aureus 01/04/2017 NEGATIVE  NEGATIVE Final   Comment:        The Xpert SA Assay (FDA approved for NASAL specimens in patients over 63 years of age), is one component of a comprehensive surveillance program.  Test performance has been validated by [facility] for patients greater than or equal to 34 year old. It is not intended to diagnose infection nor to guide or monitor treatment.   . ABO/RH(D) 01/04/2017 AB NEG   Final  . Antibody Screen 01/04/2017 NEG   Final  . Sample Expiration 01/04/2017 01/13/2017   Final  . Extend sample reason 01/04/2017 NO TRANSFUSIONS OR PREGNANCY IN THE PAST 3 MONTHS   Final  Office Visit on 12/12/2016  Component Date Value Ref Range Status  . Yeast Wet Prep HPF POC 12/12/2016 FEW* NONE SEEN Final  . Trich, Wet Prep 12/12/2016 NONE SEEN  NONE SEEN Final  . Clue Cells Wet Prep HPF POC 12/12/2016 NONE SEEN  NONE SEEN Final  . WBC, Wet Prep HPF POC 12/12/2016 NONE SEEN  NONE SEEN Final   Comment: FEW BACTERIA SEEN (7-12) EPITH. CELLS PER HPF      X-Rays:Dg Pelvis Portable  Result Date: 01/29/2017 CLINICAL DATA:  Interval reduction dislocated hip prosthesis. EXAM: PORTABLE PELVIS 1-2 VIEWS COMPARISON:  Hip radiograph earlier same date. FINDINGS: Interval relocation of the left hip. Small cortical lucency through the left superior acetabulum laterally. SI joints are unremarkable. Lumbar spine degenerative changes. Right hip arthroplasty. IMPRESSION: Interval relocation left hip. Small cortical abnormality involving the superior left acetabulum adjacent to the acetabular component, concerning for fracture. Electronically Signed   By: Lovey Newcomer M.D.   On: 01/29/2017 14:57   Dg C-arm  1-60 Min-no Report  Result Date: 01/10/2017 Fluoroscopy was utilized by the requesting physician.  No radiographic interpretation.   US Breast Ltd Uni Left Inc Axilla  Result Date: 01/23/2017 CLINICAL DATA:  Screening recall for mass seen in the left breast on the CC view only. EXAM: 2D DIGITAL DIAGNOSTIC UNILATERAL LEFT MAMMOGRAM WITH CAD AND ADJUNCT TOMO LEFT BREAST ULTRASOUND COMPARISON:  Previous exam(s). ACR Breast Density Category b: There are scattered areas of fibroglandular density. FINDINGS: Spot compression CC tomograms and ML tomograms were performed of the left breast demonstrating a persistent mass with irregular margins measuring approximately 6-7 mm. Mammographic images were processed with CAD. Physical examination of the inner left breast reveals a small area of nodularity at the approximate 9 o'clock position. Targeted ultrasound of the left breast was performed demonstrating an irregular hypoechoic mass at 9 o'clock 3 cm from nipple measuring 0.6 x 0.6 x 0.7 cm. This corresponds well with mammography findings. Left axillary lymph nodes demonstrate uniformly thickened cortices with preserved echogenic/fatty hila, similar in appearance when compared to the lymph nodes in the right axilla and therefore considered normal for the patient. IMPRESSION: Indeterminate left breast mass, suspicious for malignancy however fat necrosis is a possibility. RECOMMENDATION: Ultrasound-guided biopsy of the suspicious mass in the inner left breast. This is scheduled for Thursday 08/29/2016 at 12:45 p.m. Note that the patient is currently taking 81 mg aspirin BID as she has had bilateral knee and hip replacements, with most recent hip replacement approximately 2 weeks prior. I have discussed the findings and recommendations with the patient. Results were also provided in writing at the conclusion of the visit. If applicable, a reminder letter will be sent to the patient regarding the next appointment. BI-RADS  CATEGORY  4: Suspicious. Electronically Signed   By: Everlean Alstrom M.D.   On: 01/23/2017 12:31   Mm Diag Breast Tomo Uni Left  Result Date: 01/23/2017 CLINICAL DATA:  Screening recall for mass seen in the left breast on the CC view only. EXAM: 2D DIGITAL DIAGNOSTIC UNILATERAL LEFT MAMMOGRAM WITH CAD AND ADJUNCT TOMO LEFT BREAST ULTRASOUND COMPARISON:  Previous exam(s). ACR Breast Density Category b: There are scattered areas of fibroglandular density. FINDINGS: Spot compression CC tomograms and ML tomograms were performed of the left breast demonstrating a persistent mass with irregular margins measuring approximately 6-7 mm. Mammographic images were processed with CAD. Physical examination of the inner left breast reveals a small area of nodularity at the approximate 9 o'clock position. Targeted ultrasound of the left breast was performed demonstrating an irregular hypoechoic mass at 9 o'clock 3 cm from nipple measuring 0.6 x 0.6 x 0.7 cm. This corresponds well with mammography findings. Left axillary lymph nodes demonstrate uniformly thickened cortices with preserved echogenic/fatty hila, similar in appearance when compared to the lymph nodes in the right axilla and therefore considered normal for the patient. IMPRESSION: Indeterminate left breast mass, suspicious for malignancy however fat necrosis is a possibility. RECOMMENDATION: Ultrasound-guided biopsy of the suspicious mass in the inner left breast. This is scheduled for Thursday 08/29/2016 at 12:45 p.m. Note that the patient is currently taking 81 mg aspirin BID as she has had bilateral knee and hip replacements, with most recent hip replacement approximately 2 weeks prior. I have discussed the findings and recommendations with the patient. Results were also provided in writing at the conclusion of the visit. If applicable, a reminder letter will be sent to the patient regarding the next appointment. BI-RADS CATEGORY  4: Suspicious. Electronically  Signed   By: Everlean Alstrom M.D.   On: 01/23/2017 12:31   Dg Hip Port Unilat W Or Wo Pelvis 1 View Left  Result Date: 02/03/2017 CLINICAL DATA:  Left hip dislocation and reduction. EXAM: DG HIP (WITH OR WITHOUT PELVIS) 1V PORT LEFT COMPARISON:  One-view left hip from the same day. FINDINGS: 3 AP images of the left hip are submitted. The hip is dislocated on the first 2 images. The femoral component projects over the acetabular component on the final image. There is no associated fracture. IMPRESSION: 1. Reduction of left hip dislocation without radiographic evidence for complication. Electronically Signed   By: San Morelle M.D.   On: 02/03/2017 16:33   Dg Hip Port Unilat With Pelvis 1v Left  Result Date: 02/03/2017 CLINICAL DATA:  Left hip pain. EXAM: DG HIP (WITH OR WITHOUT PELVIS) 1V PORT LEFT COMPARISON:  01/29/2017 FINDINGS: There is anterior superolateral dislocation of the femoral component of the left total hip prosthesis. Acetabular component appears in good position. IMPRESSION: Dislocation of the left hip prosthesis as described. Electronically Signed   By: Lorriane Shire M.D.   On: 02/03/2017 13:14   Dg Hip Port Unilat With Pelvis 1v Left  Result Date: 01/10/2017 CLINICAL DATA:  Post left hip replacement EXAM: DG HIP (WITH OR WITHOUT PELVIS) 1V PORT LEFT COMPARISON:  CT abdomen pelvis of 09/05/2016 FINDINGS: Previous right total hip replacement components appear unchanged. A new left total hip replacement is now present. No complicating features are seen. Alignment appears grossly normal. The bones do appear to be diffusely osteopenic. IMPRESSION: 1. New left total hip replacement components in good position with no complicating features. 2. Stable previous right total hip replacement components. Electronically Signed   By: Eddie Dibbles  Alvester Chou M.D.   On: 01/10/2017 11:57   Dg Hip Unilat W Or Wo Pelvis 2-3 Views Left  Result Date: 01/29/2017 CLINICAL DATA:  Left hip pain EXAM: DG HIP  (WITH OR WITHOUT PELVIS) 2-3V LEFT COMPARISON:  01/10/2017 FINDINGS: Frontal pelvis with AP and cross-table lateral views of the left hip demonstrate superior left femoral component dislocation in this patient status post left total hip replacement. No evidence for periprosthetic fracture. Bones are diffusely demineralized. Patient also has right total hip replacement. IMPRESSION: Superior dislocation of the femoral component of the left total hip replacement. Electronically Signed   By: Misty Stanley M.D.   On: 01/29/2017 13:36    EKG: Orders placed or performed during the hospital encounter of 09/21/15  . EKG 12 lead  . EKG 12 lead     Hospital Course: AMEYA VOWELL is a 81 y.o. who was brought to [facility] Emergency Department. Per EMS, patient reported left hip "popped out" while sitting down that morning. Reported hip replacement in June.    ED discussed with Dr. Alvan Dame, pt's surgeon, and reduction performed by Dr. Stann Mainland, his partner, at bedside following conscious sedation in the ED.  Pt tolerated well and was then admitted for observation.    Patient had a decent night on the evening of admission.  They started to get up OOB with therapy on day one.  PT was  ordered to teach brace application and gait training.  Patient was seen in rounds on day one and it was felt that as long as they did well with their session of therapy following brace application, they would be ready to go home.  Arrangements were made for the hip abduction brace prior to discharge.  Up with therapy following brace application Diet - Cardiac diet Follow up - as instructed with Dr. Alvan Dame Activity - WBAT, Brace at all times Disposition - Home Condition Upon Discharge - Stable D/C Meds - See DC Summary  Discharge Instructions    Call MD / Call 911    Complete by:  As directed    If you experience chest pain or shortness of breath, CALL 911 and be transported to the hospital emergency room.  If you develope a  fever above 101 F, pus (white drainage) or increased drainage or redness at the wound, or calf pain, call your surgeon's office.   Constipation Prevention    Complete by:  As directed    Drink plenty of fluids.  Prune juice may be helpful.  You may use a stool softener, such as Colace (over the counter) 100 mg twice a day.  Use MiraLax (over the counter) for constipation as needed.   Diet - low sodium heart healthy    Complete by:  As directed    Discharge instructions    Complete by:  As directed    Pick up stool softner and laxative for home use following surgery while on pain medications. Do not submerge incision under water. Please use good hand washing techniques while changing dressing each day. May shower starting three days after surgery. Please use a clean towel to pat the incision dry following showers. Continue to use ice for pain and swelling after surgery. Do not use any lotions or creams on the incision until instructed by your surgeon.  Wear both TED hose on both legs during the day every day for three weeks, but may remove the TED hose at night at home.  Postoperative Constipation Protocol  Constipation -  defined medically as fewer than three stools per week and severe constipation as less than one stool per week.  One of the most common issues patients have following surgery is constipation.  Even if you have a regular bowel pattern at home, your normal regimen is likely to be disrupted due to multiple reasons following surgery.  Combination of anesthesia, postoperative narcotics, change in appetite and fluid intake all can affect your bowels.  In order to avoid complications following surgery, here are some recommendations in order to help you during your recovery period.  Colace (docusate) - Pick up an over-the-counter form of Colace or another stool softener and take twice a day as long as you are requiring postoperative pain medications.  Take with a full glass of water  daily.  If you experience loose stools or diarrhea, hold the colace until you stool forms back up.  If your symptoms do not get better within 1 week or if they get worse, check with your doctor.  Dulcolax (bisacodyl) - Pick up over-the-counter and take as directed by the product packaging as needed to assist with the movement of your bowels.  Take with a full glass of water.  Use this product as needed if not relieved by Colace only.   MiraLax (polyethylene glycol) - Pick up over-the-counter to have on hand.  MiraLax is a solution that will increase the amount of water in your bowels to assist with bowel movements.  Take as directed and can mix with a glass of water, juice, soda, coffee, or tea.  Take if you go more than two days without a movement. Do not use MiraLax more than once per day. Call your doctor if you are still constipated or irregular after using this medication for 7 days in a row.  If you continue to have problems with postoperative constipation, please contact the office for further assistance and recommendations.  If you experience "the worst abdominal pain ever" or develop nausea or vomiting, please contact the office immediatly for further recommendations for treatment.   Do not sit on low chairs, stoools or toilet seats, as it may be difficult to get up from low surfaces    Complete by:  As directed    Driving restrictions    Complete by:  As directed    No driving until released by the physician.   Follow the hip precautions as taught in Physical Therapy    Complete by:  As directed    AVOID extension  Hip Abduction Brace to be applied.  Please teach doffing and donning of brace   Increase activity slowly as tolerated    Complete by:  As directed    Lifting restrictions    Complete by:  As directed    No lifting until released by the physician.   Patient may shower    Complete by:  As directed    You may shower without a dressing once there is no drainage.  Do not  wash over the wound.  If drainage remains, do not shower until drainage stops.   Weight bearing as tolerated    Complete by:  As directed    Laterality:  left   Extremity:  Lower     Allergies as of 02/04/2017      Reactions   Codeine Nausea Only   Epinephrine    Tachcardyia, chest pains tremors   Tenormin [atenolol] Rash      Medication List    TAKE these medications  aspirin 81 MG chewable tablet Chew 1 tablet (81 mg total) by mouth 2 (two) times daily. Take for 4 weeks, then resume regular dose.   celecoxib 200 MG capsule Commonly known as:  CELEBREX Take 200 mg by mouth daily.   clobetasol ointment 0.05 % Commonly known as:  TEMOVATE Apply 1 application topically 2 (two) times daily. What changed:  when to take this  reasons to take this   docusate sodium 100 MG capsule Commonly known as:  COLACE Take 1 capsule (100 mg total) by mouth 2 (two) times daily.   hydrochlorothiazide 25 MG tablet Commonly known as:  HYDRODIURIL Take 12.5 mg by mouth daily.   levothyroxine 112 MCG tablet Commonly known as:  SYNTHROID, LEVOTHROID Take 112 mcg by mouth daily before breakfast.   linaclotide 72 MCG capsule Commonly known as:  LINZESS Take 72 mcg by mouth daily as needed (for constipation).   methocarbamol 500 MG tablet Commonly known as:  ROBAXIN Take 1 tablet (500 mg total) by mouth every 6 (six) hours as needed for muscle spasms.   NU-IRON 150 MG capsule Generic drug:  iron polysaccharides Take 150 mg by mouth at bedtime.   oxyCODONE 5 MG immediate release tablet Commonly known as:  Oxy IR/ROXICODONE Take 1-3 tablets (5-15 mg total) by mouth every 4 (four) hours as needed for moderate pain or severe pain.   oxyCODONE-acetaminophen 5-325 MG tablet Commonly known as:  PERCOCET/ROXICET Take 1 tablet by mouth every 6 (six) hours as needed for severe pain.   pantoprazole 40 MG tablet Commonly known as:  PROTONIX Take 40 mg by mouth daily as needed  (indigestion).   polyethylene glycol packet Commonly known as:  MIRALAX / GLYCOLAX Take 17 g by mouth 2 (two) times daily.   senna 8.6 MG tablet Commonly known as:  SENOKOT Take 34.4 tablets by mouth daily as needed for constipation.   VITAMIN B-12 PO Take 1 tablet by mouth every evening.   Vitamin D 2000 units Caps Take 2,000 Units by mouth daily.      Follow-up Information    Paralee Cancel, MD. Schedule an appointment as soon as possible for a visit.   Specialty:  Orthopedic Surgery Why:  Call office Monday to setup appointment with Dr. Alvan Dame. Contact information: [address] [place] [address] [phone]           Signed: Arlee Muslim, PA-C Orthopaedic Surgery 02/04/2017, 7:48 AM

## 2017-02-04 NOTE — Care Management Note (Signed)
[facility] NOTIFICATION   Patient Details  Name: Heidi Adams MRN: [id number] Date of Birth: 07-28-1935   Medicare Observation Status Notification Given:   yes    Norina Buzzard, RN 02/04/2017, 9:55 AM

## 2017-02-04 NOTE — Progress Notes (Signed)
   Subjective:    Patient reports pain as mild.   Patient seen in rounds with Dr. Wynelle Link. Family in room. Patient is well, and has had no acute complaints or problems Patient is ready to go home following application of hip abduction brace.  Objective: Vital signs in last 24 hours: Temp:  [98.4 F (36.9 C)-99.1 F (37.3 C)] 98.4 F (36.9 C) (07/14 0454) Pulse Rate:  [64-94] 74 (07/14 0454) Resp:  [11-31] 15 (07/14 0454) BP: (121-196)/(43-94) 140/50 (07/14 0454) SpO2:  [93 %-100 %] 93 % (07/14 0454) Weight:  [63.5 kg (140 lb)] 63.5 kg (140 lb) (07/13 1148)  Intake/Output from previous day:  Intake/Output Summary (Last 24 hours) at 02/04/17 0712 Last data filed at 02/04/17 0456  Gross per 24 hour  Intake              220 ml  Output                0 ml  Net              220 ml    Intake/Output this shift: No intake/output data recorded.  Labs:  Recent Labs  02/03/17 1818  HGB 10.2*    Recent Labs  02/03/17 1818  WBC 7.7  RBC 3.70*  HCT 33.1*  PLT 239    Recent Labs  02/03/17 1818  NA 139  K 3.4*  CL 104  CO2 27  BUN 15  CREATININE 0.56  GLUCOSE 89  CALCIUM 8.9   No results for input(s): LABPT, INR in the last 72 hours.  EXAM: General - Patient is Alert and Appropriate Extremity - Neurovascular intact Sensation intact distally Motor Function - intact, moving foot and toes well on exam.   Assessment/Plan:     Past Medical History:  Diagnosis Date  . Anemia    hx of anemia  . Arthritis   . Cancer ([place])    skin ca nose  . Dysrhythmia    went into Atrial Fibrillation after last surgery  . History of skin cancer   . Hypertension   . PVC (premature ventricular contraction)   . 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   Weight as of this encounter: 63.5 kg (140  lb). Up with therapy following brace application Diet - Cardiac diet Follow up - as instructed with Dr. Alvan Dame Activity - WBAT, Brace at all times Disposition - Home Condition Upon Discharge - Stable D/C Meds - See DC Summary  Arlee Muslim, PA-C Orthopaedic Surgery 02/04/2017, 7:12 AM

## 2017-02-04 NOTE — Evaluation (Signed)
Physical Therapy Evaluation Patient Details Name: Heidi Adams MRN: [id number] DOB: November 12, 1935 Today's Date: 02/04/2017   History of Present Illness  Pt s/p multiple dislocations of recent L THR.  Pt with hx of Bil THR and Bil TKR  Clinical Impression  Pt admitted as above and presenting with functional mobility limitations 2* abductor brace presence.  Pt currently requiring min assist to bed mobility but performing other mobility tasks at sup level.  Pt very eager for dc home this date with spouse.    Follow Up Recommendations Home health PT    Equipment Recommendations  None recommended by PT    Recommendations for Other Services       Precautions / Restrictions Precautions Precautions: Fall Required Braces or Orthoses: Other Brace/Splint Other Brace/Splint: L abductor brace Restrictions Weight Bearing Restrictions: No Other Position/Activity Restrictions: WBAT      Mobility  Bed Mobility Overal bed mobility: Needs Assistance Bed Mobility: Supine to Sit     Supine to sit: Min assist;Min guard     General bed mobility comments: cues for sequence and use of R LE to self assist  Transfers Overall transfer level: Needs assistance Equipment used: Rolling walker (2 wheeled) Transfers: Sit to/from Stand Sit to Stand: Min guard;Supervision         General transfer comment: cues for LE management and use of UEs to self assist  Ambulation/Gait Ambulation/Gait assistance: Supervision Ambulation Distance (Feet): 100 Feet Assistive device: Rolling walker (2 wheeled) Gait Pattern/deviations: Decreased step length - right;Decreased step length - left;Step-to pattern;Step-through pattern;Shuffle;Trunk flexed Gait velocity: decr Gait velocity interpretation: Below normal speed for age/gender General Gait Details: cues for posture, position from RW and ltd stride length  Stairs Stairs: Yes Stairs assistance: Min guard Stair Management: No rails;Step to  pattern;Forwards;With walker Number of Stairs: 2 General stair comments: single step twice with RW and cues for sequence and foot/RW placement  Wheelchair Mobility    Modified Rankin (Stroke Patients Only)       Balance Overall balance assessment: No apparent balance deficits (not formally assessed)                                           Pertinent Vitals/Pain Pain Assessment: No/denies pain    Home Living Family/patient expects to be discharged to:: Private residence Living Arrangements: Spouse/significant other Available Help at Discharge: Available PRN/intermittently Type of Home: House Home Access: Stairs to enter Entrance Stairs-Rails: None Entrance Stairs-Number of Steps: 1+1 Home Layout: One level Home Equipment: Bedside commode;Walker - 2 wheels      Prior Function Level of Independence: Independent               Hand Dominance        Extremity/Trunk Assessment   Upper Extremity Assessment Upper Extremity Assessment: Overall WFL for tasks assessed    Lower Extremity Assessment Lower Extremity Assessment: LLE deficits/detail LLE Deficits / Details: abductor splint in place    Cervical / Trunk Assessment Cervical / Trunk Assessment: Kyphotic  Communication   Communication: No difficulties  Cognition Arousal/Alertness: Awake/alert Behavior During Therapy: WFL for tasks assessed/performed Overall Cognitive Status: Within Functional Limits for tasks assessed                                        General  Comments      Exercises     Assessment/Plan    PT Assessment Patient needs continued PT services  PT Problem List Decreased strength;Decreased range of motion;Decreased activity tolerance;Decreased mobility;Decreased knowledge of precautions       PT Treatment Interventions DME instruction;Gait training;Stair training;Functional mobility training;Therapeutic activities;Patient/family education    PT  Goals (Current goals can be found in the Care Plan section)  Acute Rehab PT Goals Patient Stated Goal: Regain IND and not dislocate again PT Goal Formulation: All assessment and education complete, DC therapy    Frequency Min 1X/week   Barriers to discharge        Co-evaluation               AM-PAC PT "6 Clicks" Daily Activity  Outcome Measure Difficulty turning over in bed (including adjusting bedclothes, sheets and blankets)?: A Little Difficulty moving from lying on back to sitting on the side of the bed? : A Little Difficulty sitting down on and standing up from a chair with arms (e.g., wheelchair, bedside commode, etc,.)?: A Little Help needed moving to and from a bed to chair (including a wheelchair)?: A Little Help needed walking in hospital room?: A Little Help needed climbing 3-5 steps with a railing? : A Little 6 Click Score: 18    End of Session Equipment Utilized During Treatment: Other (comment) (abductor brace) Activity Tolerance: Patient tolerated treatment well;No increased pain Patient left: in chair;with call bell/phone within reach;with family/visitor present Nurse Communication: Mobility status PT Visit Diagnosis: Difficulty in walking, not elsewhere classified (R26.2)    Time: 8003-4917 PT Time Calculation (min) (ACUTE ONLY): 32 min   Charges:   PT Evaluation $PT Eval Low Complexity: 1 Procedure PT Treatments $Gait Training: 8-22 mins   PT G Codes:   PT G-Codes **NOT FOR INPATIENT CLASS** Functional Assessment Tool Used: Clinical judgement Functional Limitation: Mobility: Walking and moving around Mobility: Walking and Moving Around Current Status (H1505): At least 1 percent but less than 20 percent impaired, limited or restricted Mobility: Walking and Moving Around Goal Status [phone]): At least 1 percent but less than 20 percent impaired, limited or restricted Mobility: Walking and Moving Around Discharge Status [phone]): At least 1 percent but  less than 20 percent impaired, limited or restricted    Pg [phone]   Izmael Duross 02/04/2017, 4:40 PM

## 2017-02-04 NOTE — Discharge Instructions (Signed)
Hip Abduction Brace At All Times

## 2017-02-08 DIAGNOSIS — Z96642 Presence of left artificial hip joint: Secondary | ICD-10-CM | POA: Diagnosis not present

## 2017-02-08 DIAGNOSIS — Z471 Aftercare following joint replacement surgery: Secondary | ICD-10-CM | POA: Diagnosis not present

## 2017-02-08 DIAGNOSIS — S73015S Posterior dislocation of left hip, sequela: Secondary | ICD-10-CM | POA: Diagnosis not present

## 2017-02-10 DIAGNOSIS — R634 Abnormal weight loss: Secondary | ICD-10-CM | POA: Diagnosis not present

## 2017-02-10 DIAGNOSIS — D649 Anemia, unspecified: Secondary | ICD-10-CM | POA: Diagnosis not present

## 2017-02-10 DIAGNOSIS — N63 Unspecified lump in breast: Secondary | ICD-10-CM | POA: Diagnosis not present

## 2017-02-10 DIAGNOSIS — I10 Essential (primary) hypertension: Secondary | ICD-10-CM | POA: Diagnosis not present

## 2017-02-10 DIAGNOSIS — I480 Paroxysmal atrial fibrillation: Secondary | ICD-10-CM | POA: Diagnosis not present

## 2017-02-10 DIAGNOSIS — Z6825 Body mass index (BMI) 25.0-25.9, adult: Secondary | ICD-10-CM | POA: Diagnosis not present

## 2017-02-10 DIAGNOSIS — M1990 Unspecified osteoarthritis, unspecified site: Secondary | ICD-10-CM | POA: Diagnosis not present

## 2017-02-10 DIAGNOSIS — N6320 Unspecified lump in the left breast, unspecified quadrant: Secondary | ICD-10-CM | POA: Diagnosis not present

## 2017-02-10 DIAGNOSIS — I951 Orthostatic hypotension: Secondary | ICD-10-CM | POA: Diagnosis not present

## 2017-02-13 ENCOUNTER — Other Ambulatory Visit: Payer: PPO

## 2017-02-21 ENCOUNTER — Other Ambulatory Visit: Payer: Self-pay | Admitting: Internal Medicine

## 2017-02-21 DIAGNOSIS — N6320 Unspecified lump in the left breast, unspecified quadrant: Secondary | ICD-10-CM

## 2017-02-21 DIAGNOSIS — R928 Other abnormal and inconclusive findings on diagnostic imaging of breast: Secondary | ICD-10-CM

## 2017-03-02 DIAGNOSIS — M25552 Pain in left hip: Secondary | ICD-10-CM | POA: Diagnosis not present

## 2017-03-02 DIAGNOSIS — Z471 Aftercare following joint replacement surgery: Secondary | ICD-10-CM | POA: Diagnosis not present

## 2017-03-02 DIAGNOSIS — Z96642 Presence of left artificial hip joint: Secondary | ICD-10-CM | POA: Diagnosis not present

## 2017-03-02 DIAGNOSIS — S73015S Posterior dislocation of left hip, sequela: Secondary | ICD-10-CM | POA: Diagnosis not present

## 2017-03-20 ENCOUNTER — Other Ambulatory Visit: Payer: PPO

## 2017-03-30 DIAGNOSIS — S73015S Posterior dislocation of left hip, sequela: Secondary | ICD-10-CM | POA: Diagnosis not present

## 2017-04-10 ENCOUNTER — Other Ambulatory Visit: Payer: PPO

## 2017-04-25 DIAGNOSIS — H9202 Otalgia, left ear: Secondary | ICD-10-CM | POA: Diagnosis not present

## 2017-05-01 ENCOUNTER — Other Ambulatory Visit: Payer: PPO

## 2017-05-08 ENCOUNTER — Other Ambulatory Visit: Payer: PPO

## 2017-05-10 DIAGNOSIS — Z471 Aftercare following joint replacement surgery: Secondary | ICD-10-CM | POA: Diagnosis not present

## 2017-05-10 DIAGNOSIS — Z96651 Presence of right artificial knee joint: Secondary | ICD-10-CM | POA: Diagnosis not present

## 2017-05-10 DIAGNOSIS — M7061 Trochanteric bursitis, right hip: Secondary | ICD-10-CM | POA: Diagnosis not present

## 2017-05-10 DIAGNOSIS — Z96641 Presence of right artificial hip joint: Secondary | ICD-10-CM | POA: Diagnosis not present

## 2017-05-10 DIAGNOSIS — M7062 Trochanteric bursitis, left hip: Secondary | ICD-10-CM | POA: Diagnosis not present

## 2017-05-10 DIAGNOSIS — Z96642 Presence of left artificial hip joint: Secondary | ICD-10-CM | POA: Diagnosis not present

## 2017-05-15 ENCOUNTER — Ambulatory Visit
Admission: RE | Admit: 2017-05-15 | Discharge: 2017-05-15 | Disposition: A | Discharge status: Home / Self Care | Payer: PPO | Source: Ambulatory Visit | Attending: Internal Medicine | Admitting: Internal Medicine

## 2017-05-15 ENCOUNTER — Other Ambulatory Visit: Payer: Self-pay | Admitting: Internal Medicine

## 2017-05-15 DIAGNOSIS — R928 Other abnormal and inconclusive findings on diagnostic imaging of breast: Secondary | ICD-10-CM

## 2017-05-15 DIAGNOSIS — N6320 Unspecified lump in the left breast, unspecified quadrant: Secondary | ICD-10-CM | POA: Diagnosis not present

## 2017-05-15 DIAGNOSIS — N6324 Unspecified lump in the left breast, lower inner quadrant: Secondary | ICD-10-CM | POA: Diagnosis not present

## 2017-05-15 DIAGNOSIS — N6322 Unspecified lump in the left breast, upper inner quadrant: Secondary | ICD-10-CM | POA: Diagnosis not present

## 2017-05-15 IMAGING — US US BREAST BX W LOC DEV 1ST LESION IMG BX SPEC US GUIDE*L*
1 series · 12 of 12 positions shown · non-contrast
Comparison: Previous exam(s).

ADDENDUM:
Pathology revealed MICROSCOPIC FOCUS SUSPICIOUS FOR DUCTAL CARCINOMA
of the Left breast, 9 o'clock, 3 cmfn. These findings are suspicious
for a low grade invasive ductal carcinoma. This was found to be
concordant by Dr. Jaquan Bish. Pathology results were discussed
with the patient by telephone. The patient reported doing well after
the biopsy with tenderness at the site. Post biopsy instructions and
care were reviewed and questions were answered. The patient was
encouraged to call The [REDACTED] for any
additional concerns. Surgical consultation has been arranged with
Dr. Prince Kyle China, per patient request, at [REDACTED]
on May 25, 2017.

Pathology results reported by Shui Kuen Phen, RN on 05/17/2017.
CLINICAL DATA: Ultrasound-guided biopsy was recommended of a 7 mm
mass in the 9 o'clock position of the left breast evaluated with
mammogram and ultrasound in January 2017.
EXAM:
ULTRASOUND GUIDED LEFT BREAST CORE NEEDLE BIOPSY

[Series 1: us breast bx w loc dev 1st lesion img bx spec us g · 0.06mm/px · 12 of 12 slices shown]
[im 1/12]
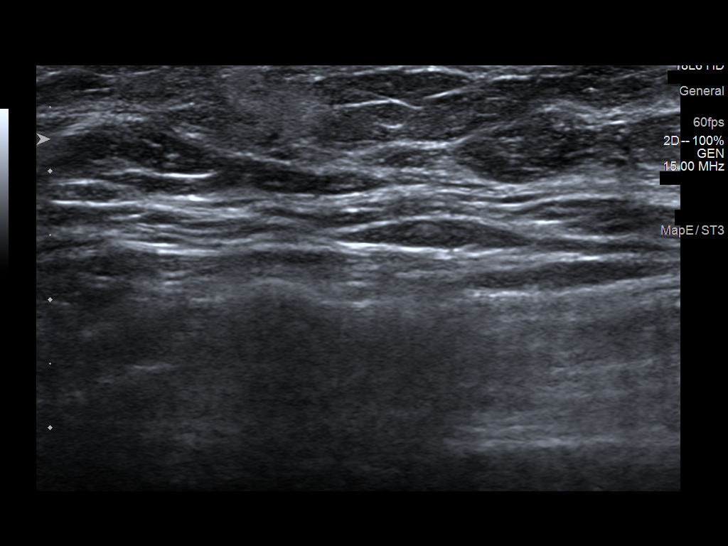
[im 2/12]
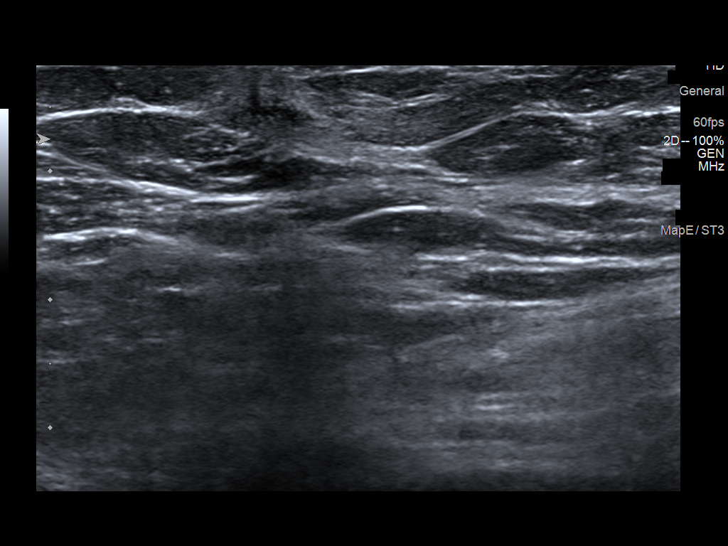
[im 3/12]
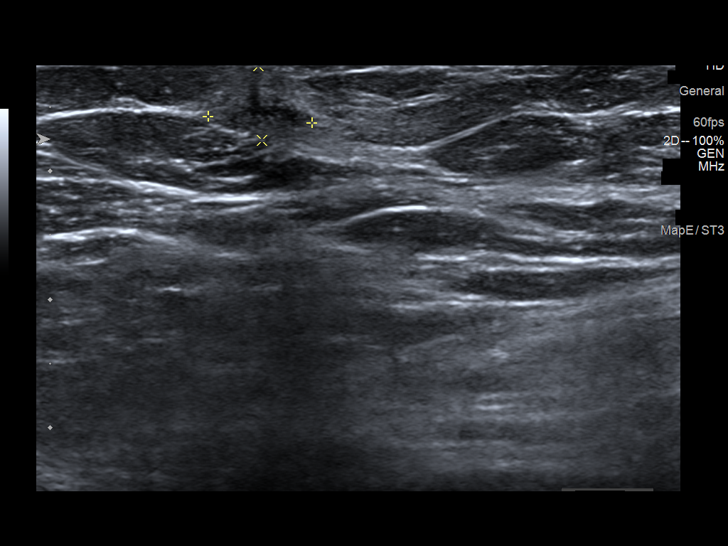
[im 4/12]
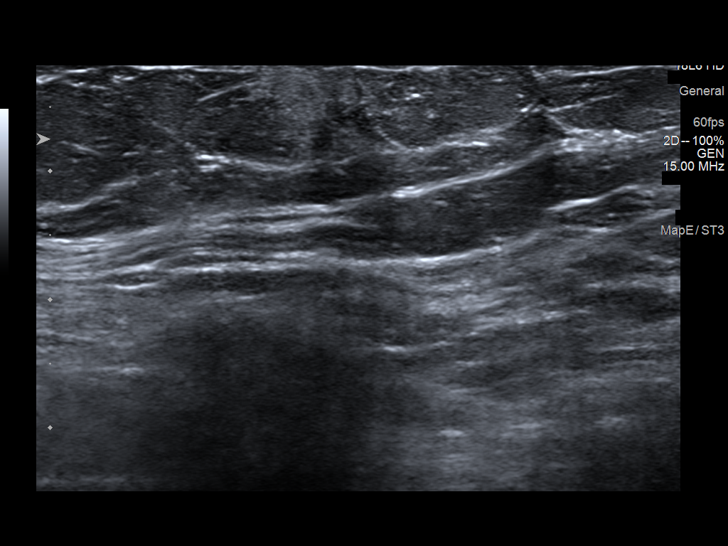
[im 5/12]
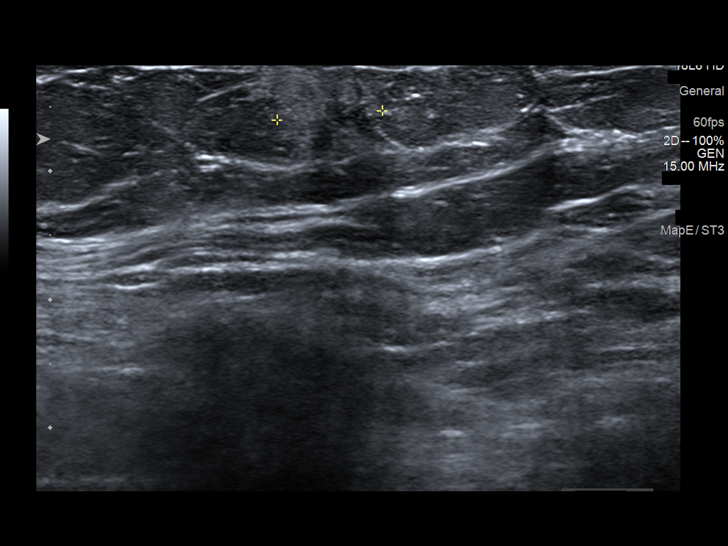
[im 6/12]
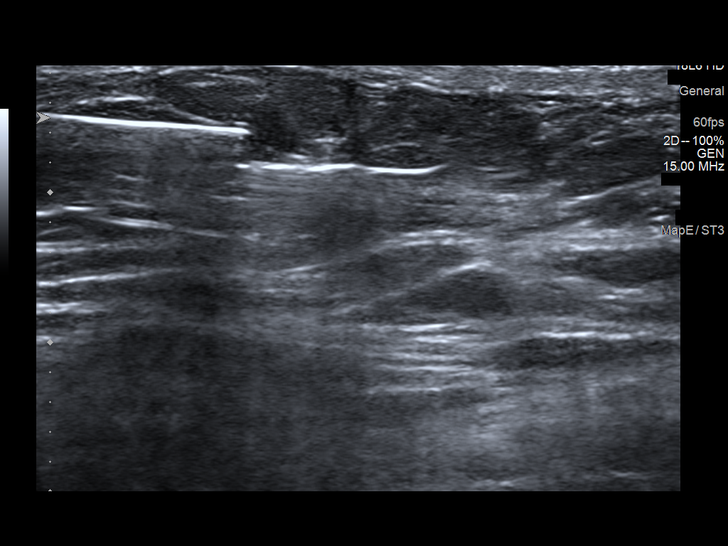
[im 7/12]
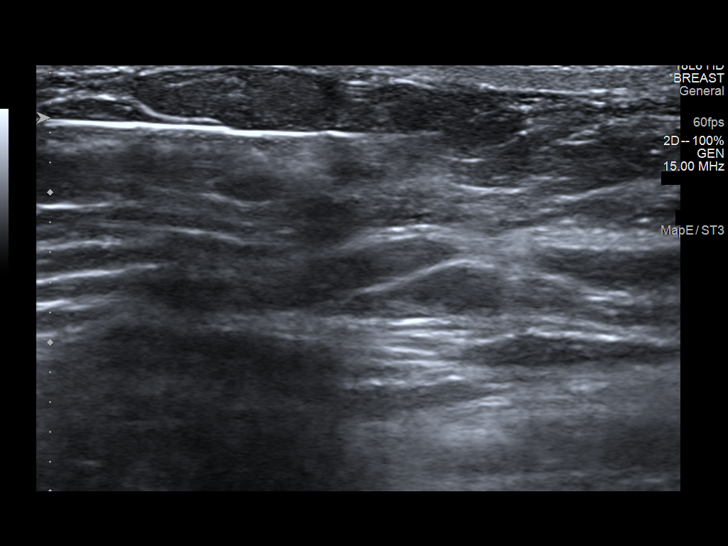
[im 8/12]
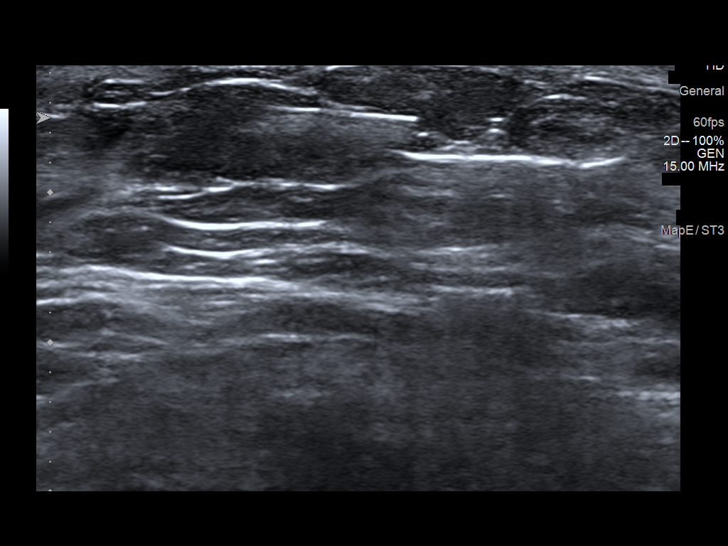
[im 9/12]
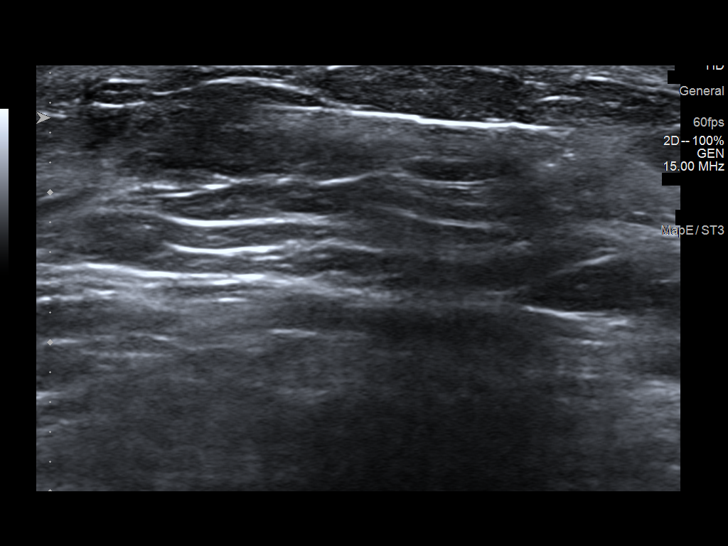
[im 10/12]
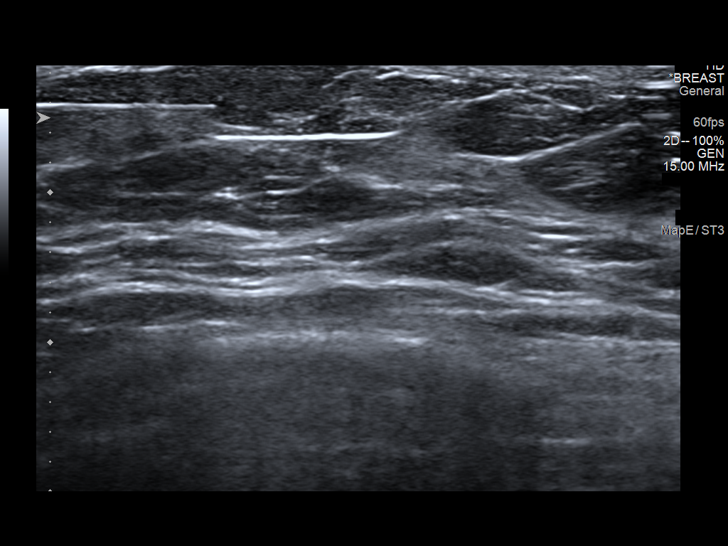
[im 11/12]
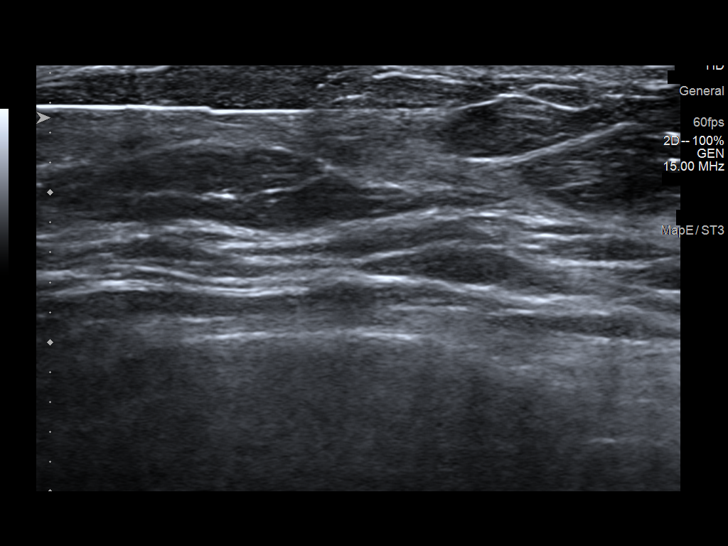
[im 12/12]
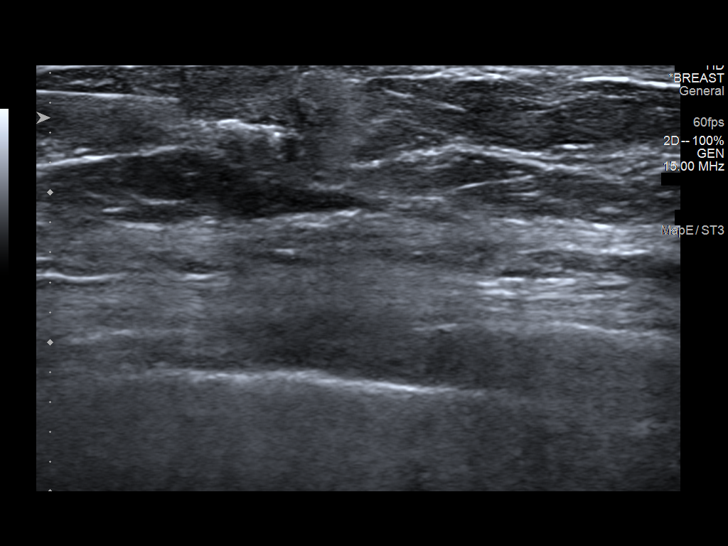

[12 of 12 positions shown; findings below may reference images not displayed]



Lesion quadrant: Lower inner quadrant

Using sterile technique and 1% Lidocaine as local anesthetic, under
direct ultrasound visualization, a 12 gauge Ploutarxos device was
used to perform biopsy of a 7 mm mass at 9 o'clock position 3 cm
from the nipple using a medial to lateral approach. At the
conclusion of the procedure a ribbon tissue marker clip was deployed
into the biopsy cavity. Follow up 2 view mammogram was performed and
dictated separately.
IMPRESSION: Ultrasound guided biopsy of the left breast. No apparent
complications.

## 2017-05-23 DIAGNOSIS — M1811 Unilateral primary osteoarthritis of first carpometacarpal joint, right hand: Secondary | ICD-10-CM | POA: Diagnosis not present

## 2017-05-23 DIAGNOSIS — M659 Synovitis and tenosynovitis, unspecified: Secondary | ICD-10-CM | POA: Diagnosis not present

## 2017-05-23 DIAGNOSIS — M1812 Unilateral primary osteoarthritis of first carpometacarpal joint, left hand: Secondary | ICD-10-CM | POA: Diagnosis not present

## 2017-05-23 DIAGNOSIS — M19022 Primary osteoarthritis, left elbow: Secondary | ICD-10-CM | POA: Diagnosis not present

## 2017-05-26 ENCOUNTER — Encounter (HOSPITAL_BASED_OUTPATIENT_CLINIC_OR_DEPARTMENT_OTHER)
Admission: RE | Admit: 2017-05-26 | Discharge: 2017-05-26 | Disposition: A | Discharge status: Home / Self Care | Payer: PPO | Source: Ambulatory Visit | Attending: General Surgery | Admitting: General Surgery

## 2017-05-26 ENCOUNTER — Other Ambulatory Visit: Payer: Self-pay | Admitting: General Surgery

## 2017-05-26 ENCOUNTER — Ambulatory Visit: Payer: Self-pay | Admitting: General Surgery

## 2017-05-26 ENCOUNTER — Encounter (HOSPITAL_BASED_OUTPATIENT_CLINIC_OR_DEPARTMENT_OTHER): Payer: Self-pay | Admitting: *Deleted

## 2017-05-26 DIAGNOSIS — Z87891 Personal history of nicotine dependence: Secondary | ICD-10-CM | POA: Diagnosis not present

## 2017-05-26 DIAGNOSIS — Z888 Allergy status to other drugs, medicaments and biological substances status: Secondary | ICD-10-CM | POA: Diagnosis not present

## 2017-05-26 DIAGNOSIS — N6320 Unspecified lump in the left breast, unspecified quadrant: Secondary | ICD-10-CM

## 2017-05-26 DIAGNOSIS — E039 Hypothyroidism, unspecified: Secondary | ICD-10-CM | POA: Diagnosis not present

## 2017-05-26 DIAGNOSIS — Z7982 Long term (current) use of aspirin: Secondary | ICD-10-CM | POA: Diagnosis not present

## 2017-05-26 DIAGNOSIS — C50312 Malignant neoplasm of lower-inner quadrant of left female breast: Secondary | ICD-10-CM | POA: Diagnosis not present

## 2017-05-26 DIAGNOSIS — I4891 Unspecified atrial fibrillation: Secondary | ICD-10-CM | POA: Diagnosis not present

## 2017-05-26 DIAGNOSIS — Z86010 Personal history of colonic polyps: Secondary | ICD-10-CM | POA: Diagnosis not present

## 2017-05-26 DIAGNOSIS — Z885 Allergy status to narcotic agent status: Secondary | ICD-10-CM | POA: Diagnosis not present

## 2017-05-26 DIAGNOSIS — Z90710 Acquired absence of both cervix and uterus: Secondary | ICD-10-CM | POA: Diagnosis not present

## 2017-05-26 DIAGNOSIS — K219 Gastro-esophageal reflux disease without esophagitis: Secondary | ICD-10-CM | POA: Diagnosis not present

## 2017-05-26 DIAGNOSIS — Z79899 Other long term (current) drug therapy: Secondary | ICD-10-CM | POA: Diagnosis not present

## 2017-05-26 DIAGNOSIS — I10 Essential (primary) hypertension: Secondary | ICD-10-CM | POA: Diagnosis not present

## 2017-05-26 LAB — BASIC METABOLIC PANEL
Anion gap: 7 (ref 5–15)
BUN: 24 mg/dL — ABNORMAL HIGH (ref 6–20)
CALCIUM: 9.5 mg/dL (ref 8.9–10.3)
CO2: 26 mmol/L (ref 22–32)
CREATININE: 0.82 mg/dL (ref 0.44–1.00)
Chloride: 106 mmol/L (ref 101–111)
GFR calc non Af Amer: >60 mL/min (ref >60)
GLUCOSE: 89 mg/dL (ref 65–99)
Potassium: 4.2 mmol/L (ref 3.5–5.1)
Sodium: 139 mmol/L (ref 135–145)

## 2017-05-26 NOTE — Progress Notes (Signed)
EKG reviewed by Dr. Hodierne, will proceed with surgery as scheduled. 

## 2017-05-29 ENCOUNTER — Ambulatory Visit
Admission: RE | Admit: 2017-05-29 | Discharge: 2017-05-29 | Disposition: A | Discharge status: Home / Self Care | Payer: PPO | Source: Ambulatory Visit | Attending: General Surgery | Admitting: General Surgery

## 2017-05-29 DIAGNOSIS — N6320 Unspecified lump in the left breast, unspecified quadrant: Secondary | ICD-10-CM

## 2017-05-29 DIAGNOSIS — R928 Other abnormal and inconclusive findings on diagnostic imaging of breast: Secondary | ICD-10-CM | POA: Diagnosis not present

## 2017-05-30 ENCOUNTER — Ambulatory Visit (HOSPITAL_BASED_OUTPATIENT_CLINIC_OR_DEPARTMENT_OTHER): Payer: PPO | Admitting: Anesthesiology

## 2017-05-30 ENCOUNTER — Encounter (HOSPITAL_BASED_OUTPATIENT_CLINIC_OR_DEPARTMENT_OTHER)
Admission: RE | Disposition: A | Discharge status: Home / Self Care | Payer: Self-pay | Source: Ambulatory Visit | Attending: General Surgery

## 2017-05-30 ENCOUNTER — Encounter (HOSPITAL_BASED_OUTPATIENT_CLINIC_OR_DEPARTMENT_OTHER): Payer: Self-pay | Admitting: Anesthesiology

## 2017-05-30 ENCOUNTER — Ambulatory Visit (HOSPITAL_BASED_OUTPATIENT_CLINIC_OR_DEPARTMENT_OTHER)
Admission: RE | Admit: 2017-05-30 | Discharge: 2017-05-30 | Disposition: A | Discharge status: Home / Self Care | Payer: PPO | Source: Ambulatory Visit | Attending: General Surgery | Admitting: General Surgery

## 2017-05-30 ENCOUNTER — Other Ambulatory Visit: Payer: Self-pay

## 2017-05-30 ENCOUNTER — Ambulatory Visit
Admission: RE | Admit: 2017-05-30 | Discharge: 2017-05-30 | Disposition: A | Discharge status: Home / Self Care | Payer: PPO | Source: Ambulatory Visit | Attending: General Surgery | Admitting: General Surgery

## 2017-05-30 DIAGNOSIS — Z7982 Long term (current) use of aspirin: Secondary | ICD-10-CM | POA: Diagnosis not present

## 2017-05-30 DIAGNOSIS — C50912 Malignant neoplasm of unspecified site of left female breast: Secondary | ICD-10-CM | POA: Diagnosis not present

## 2017-05-30 DIAGNOSIS — I4891 Unspecified atrial fibrillation: Secondary | ICD-10-CM | POA: Insufficient documentation

## 2017-05-30 DIAGNOSIS — Z79899 Other long term (current) drug therapy: Secondary | ICD-10-CM | POA: Insufficient documentation

## 2017-05-30 DIAGNOSIS — E039 Hypothyroidism, unspecified: Secondary | ICD-10-CM | POA: Diagnosis not present

## 2017-05-30 DIAGNOSIS — Z885 Allergy status to narcotic agent status: Secondary | ICD-10-CM | POA: Insufficient documentation

## 2017-05-30 DIAGNOSIS — N6320 Unspecified lump in the left breast, unspecified quadrant: Secondary | ICD-10-CM | POA: Diagnosis not present

## 2017-05-30 DIAGNOSIS — K219 Gastro-esophageal reflux disease without esophagitis: Secondary | ICD-10-CM | POA: Diagnosis not present

## 2017-05-30 DIAGNOSIS — R928 Other abnormal and inconclusive findings on diagnostic imaging of breast: Secondary | ICD-10-CM | POA: Diagnosis not present

## 2017-05-30 DIAGNOSIS — C50312 Malignant neoplasm of lower-inner quadrant of left female breast: Secondary | ICD-10-CM | POA: Insufficient documentation

## 2017-05-30 DIAGNOSIS — I10 Essential (primary) hypertension: Secondary | ICD-10-CM | POA: Insufficient documentation

## 2017-05-30 DIAGNOSIS — Z87891 Personal history of nicotine dependence: Secondary | ICD-10-CM | POA: Diagnosis not present

## 2017-05-30 DIAGNOSIS — Z888 Allergy status to other drugs, medicaments and biological substances status: Secondary | ICD-10-CM | POA: Insufficient documentation

## 2017-05-30 DIAGNOSIS — Z86010 Personal history of colonic polyps: Secondary | ICD-10-CM | POA: Diagnosis not present

## 2017-05-30 DIAGNOSIS — Z90710 Acquired absence of both cervix and uterus: Secondary | ICD-10-CM | POA: Insufficient documentation

## 2017-05-30 HISTORY — DX: Hypothyroidism, unspecified: E03.9

## 2017-05-30 HISTORY — PX: BREAST LUMPECTOMY WITH RADIOACTIVE SEED LOCALIZATION: SHX6424

## 2017-05-30 HISTORY — PX: BREAST LUMPECTOMY: SHX2

## 2017-05-30 SURGERY — BREAST LUMPECTOMY WITH RADIOACTIVE SEED LOCALIZATION
Anesthesia: General | Site: Breast | Laterality: Left

## 2017-05-30 MED ORDER — SCOPOLAMINE 1 MG/3DAYS TD PT72: 1.0000 | MEDICATED_PATCH | Freq: Once | TRANSDERMAL | Status: DC | PRN

## 2017-05-30 MED ORDER — DEXAMETHASONE SODIUM PHOSPHATE 4 MG/ML IJ SOLN
INTRAMUSCULAR | Status: DC | PRN
  Administered 2017-05-30: 10 mg via INTRAVENOUS

## 2017-05-30 MED ORDER — MIDAZOLAM HCL 2 MG/2ML IJ SOLN
1.0000 mg | INTRAMUSCULAR | Status: DC | PRN
Start: 2017-05-30 — End: 2017-05-30

## 2017-05-30 MED ORDER — LIDOCAINE 2% (20 MG/ML) 5 ML SYRINGE
INTRAMUSCULAR | Status: AC
  Filled 2017-05-30: qty 5

## 2017-05-30 MED ORDER — GABAPENTIN 300 MG PO CAPS
ORAL_CAPSULE | ORAL | Status: AC
  Filled 2017-05-30: qty 1

## 2017-05-30 MED ORDER — BUPIVACAINE-EPINEPHRINE (PF) 0.25% -1:200000 IJ SOLN
INTRAMUSCULAR | Status: DC | PRN
Start: 2017-05-30 — End: 2017-05-30
  Administered 2017-05-30: 20 mL

## 2017-05-30 MED ORDER — PROPOFOL 10 MG/ML IV BOLUS
INTRAVENOUS | Status: AC
  Filled 2017-05-30: qty 20

## 2017-05-30 MED ORDER — ONDANSETRON HCL 4 MG/2ML IJ SOLN
INTRAMUSCULAR | Status: DC | PRN
  Administered 2017-05-30: 4 mg via INTRAVENOUS

## 2017-05-30 MED ORDER — FENTANYL CITRATE (PF) 100 MCG/2ML IJ SOLN: 25.0000 ug | INTRAMUSCULAR | Status: DC | PRN

## 2017-05-30 MED ORDER — ONDANSETRON HCL 4 MG/2ML IJ SOLN
INTRAMUSCULAR | Status: AC
  Filled 2017-05-30: qty 2

## 2017-05-30 MED ORDER — LACTATED RINGERS IV SOLN
INTRAVENOUS | Status: DC
  Administered 2017-05-30 (×2): via INTRAVENOUS

## 2017-05-30 MED ORDER — LIDOCAINE HCL (CARDIAC) 20 MG/ML IV SOLN
INTRAVENOUS | Status: DC | PRN
  Administered 2017-05-30: 50 mg via INTRAVENOUS

## 2017-05-30 MED ORDER — TRAMADOL HCL 50 MG PO TABS: 50.0000 mg | ORAL_TABLET | Freq: Four times a day (QID) | ORAL | 1 refills | Status: DC | PRN

## 2017-05-30 MED ORDER — CEFAZOLIN SODIUM-DEXTROSE 2-4 GM/100ML-% IV SOLN
INTRAVENOUS | Status: AC
  Filled 2017-05-30: qty 100

## 2017-05-30 MED ORDER — PROPOFOL 10 MG/ML IV BOLUS
INTRAVENOUS | Status: DC | PRN
  Administered 2017-05-30: 150 mg via INTRAVENOUS

## 2017-05-30 MED ORDER — ACETAMINOPHEN 500 MG PO TABS
ORAL_TABLET | ORAL | Status: AC
  Filled 2017-05-30: qty 2

## 2017-05-30 MED ORDER — DEXAMETHASONE SODIUM PHOSPHATE 10 MG/ML IJ SOLN
INTRAMUSCULAR | Status: AC
  Filled 2017-05-30: qty 1

## 2017-05-30 MED ORDER — ONDANSETRON HCL 4 MG/2ML IJ SOLN: 4.0000 mg | Freq: Once | INTRAMUSCULAR | Status: DC | PRN

## 2017-05-30 MED ORDER — EPHEDRINE SULFATE 50 MG/ML IJ SOLN
INTRAMUSCULAR | Status: DC | PRN
  Administered 2017-05-30: 15 mg via INTRAVENOUS

## 2017-05-30 MED ORDER — ACETAMINOPHEN 500 MG PO TABS: 1000.0000 mg | ORAL_TABLET | ORAL | Status: DC

## 2017-05-30 MED ORDER — FENTANYL CITRATE (PF) 100 MCG/2ML IJ SOLN
INTRAMUSCULAR | Status: AC
  Filled 2017-05-30: qty 2

## 2017-05-30 MED ORDER — CHLORHEXIDINE GLUCONATE CLOTH 2 % EX PADS: 6.0000 | MEDICATED_PAD | Freq: Once | CUTANEOUS | Status: DC

## 2017-05-30 MED ORDER — CEFAZOLIN SODIUM-DEXTROSE 2-4 GM/100ML-% IV SOLN
2.0000 g | INTRAVENOUS | Status: AC
  Administered 2017-05-30 (×2): 2 g via INTRAVENOUS

## 2017-05-30 MED ORDER — GABAPENTIN 300 MG PO CAPS
300.0000 mg | ORAL_CAPSULE | ORAL | Status: AC
  Administered 2017-05-30: 300 mg via ORAL

## 2017-05-30 MED ORDER — FENTANYL CITRATE (PF) 100 MCG/2ML IJ SOLN
50.0000 ug | INTRAMUSCULAR | Status: DC | PRN
  Administered 2017-05-30: 100 ug via INTRAVENOUS

## 2017-05-30 SURGICAL SUPPLY — 47 items
BINDER BREAST LRG (GAUZE/BANDAGES/DRESSINGS) IMPLANT
BINDER BREAST MEDIUM (GAUZE/BANDAGES/DRESSINGS) ×2 IMPLANT
BINDER BREAST XLRG (GAUZE/BANDAGES/DRESSINGS) IMPLANT
BINDER BREAST XXLRG (GAUZE/BANDAGES/DRESSINGS) IMPLANT
BLADE SURG 15 STRL LF DISP TIS (BLADE) ×1 IMPLANT
BLADE SURG 15 STRL SS (BLADE) ×1
CANISTER SUC SOCK COL 7IN (MISCELLANEOUS) IMPLANT
CANISTER SUCT 1200ML W/VALVE (MISCELLANEOUS) ×2 IMPLANT
CHLORAPREP W/TINT 26ML (MISCELLANEOUS) ×2 IMPLANT
CLIP VESOCCLUDE SM WIDE 6/CT (CLIP) ×2 IMPLANT
COVER BACK TABLE 60X90IN (DRAPES) ×2 IMPLANT
COVER MAYO STAND STRL (DRAPES) ×2 IMPLANT
COVER PROBE W GEL 5X96 (DRAPES) ×2 IMPLANT
DECANTER SPIKE VIAL GLASS SM (MISCELLANEOUS) IMPLANT
DERMABOND ADVANCED (GAUZE/BANDAGES/DRESSINGS) ×1
DERMABOND ADVANCED .7 DNX12 (GAUZE/BANDAGES/DRESSINGS) ×1 IMPLANT
DEVICE DUBIN W/COMP PLATE 8390 (MISCELLANEOUS) ×2 IMPLANT
DRAPE LAPAROSCOPIC ABDOMINAL (DRAPES) ×2 IMPLANT
DRAPE UTILITY XL STRL (DRAPES) ×2 IMPLANT
ELECT COATED BLADE 2.86 ST (ELECTRODE) ×2 IMPLANT
ELECT REM PT RETURN 9FT ADLT (ELECTROSURGICAL) ×2
ELECTRODE REM PT RTRN 9FT ADLT (ELECTROSURGICAL) ×1 IMPLANT
GLOVE BIO SURGEON STRL SZ 6.5 (GLOVE) ×2 IMPLANT
GLOVE BIOGEL PI IND STRL 8 (GLOVE) ×1 IMPLANT
GLOVE BIOGEL PI INDICATOR 8 (GLOVE) ×1
GLOVE ECLIPSE 7.5 STRL STRAW (GLOVE) ×4 IMPLANT
GLOVE EXAM NITRILE MD LF STRL (GLOVE) ×2 IMPLANT
GOWN STRL REUS W/ TWL LRG LVL3 (GOWN DISPOSABLE) ×1 IMPLANT
GOWN STRL REUS W/ TWL XL LVL3 (GOWN DISPOSABLE) ×1 IMPLANT
GOWN STRL REUS W/TWL LRG LVL3 (GOWN DISPOSABLE) ×1
GOWN STRL REUS W/TWL XL LVL3 (GOWN DISPOSABLE) ×1
ILLUMINATOR WAVEGUIDE N/F (MISCELLANEOUS) IMPLANT
KIT MARKER MARGIN INK (KITS) ×2 IMPLANT
LIGHT WAVEGUIDE WIDE FLAT (MISCELLANEOUS) IMPLANT
NEEDLE HYPO 25X1 1.5 SAFETY (NEEDLE) ×2 IMPLANT
NS IRRIG 1000ML POUR BTL (IV SOLUTION) ×2 IMPLANT
PACK BASIN DAY SURGERY FS (CUSTOM PROCEDURE TRAY) ×2 IMPLANT
PENCIL BUTTON HOLSTER BLD 10FT (ELECTRODE) ×2 IMPLANT
SLEEVE SCD COMPRESS KNEE MED (MISCELLANEOUS) ×2 IMPLANT
SPONGE LAP 4X18 X RAY DECT (DISPOSABLE) ×2 IMPLANT
SUT MON AB 5-0 PS2 18 (SUTURE) ×2 IMPLANT
SUT VICRYL 3-0 CR8 SH (SUTURE) ×2 IMPLANT
SYR CONTROL 10ML LL (SYRINGE) ×2 IMPLANT
TOWEL OR 17X24 6PK STRL BLUE (TOWEL DISPOSABLE) ×2 IMPLANT
TOWEL OR NON WOVEN STRL DISP B (DISPOSABLE) IMPLANT
TUBE CONNECTING 20X1/4 (TUBING) ×2 IMPLANT
YANKAUER SUCT BULB TIP NO VENT (SUCTIONS) ×2 IMPLANT

## 2017-05-30 NOTE — Interval H&P Note (Signed)
History and Physical Interval Note:  05/30/2017 12:52 PM  Heidi Adams  has presented today for surgery, with the diagnosis of LEFT BREAST MASS  The various methods of treatment have been discussed with the patient and family. After consideration of risks, benefits and other options for treatment, the patient has consented to  Procedure(s): LEFT BREAST LUMPECTOMY WITH RADIOACTIVE SEED LOCALIZATION (Left) as a surgical intervention .  The patient's history has been reviewed, patient examined, no change in status, stable for surgery.  I have reviewed the patient's chart and labs.  Questions were answered to the patient's satisfaction.     Janeli Lewison T

## 2017-05-30 NOTE — H&P (Signed)
History of Present Illness Marland Kitchen T. Jolyne Laye MD; 05/26/2017 9:51 AM) The patient is a 81 year old female who presents with breast cancer. Patient is a 81 year old female referred by Dr. Curlene Dolphin for a recent breast biopsy suspicious for a small carcinoma. The patient was recently able to feel a small lump in her medial left breast. It was time for mammogram and she presented for imaging. Screening mammogram revealed a possible mass in the medial left breast. Subsequent tomogram reveals an irregular 6 mm mass in the medial breast. Ultrasound confirmed a 7 mm mass at the 9 o'clock position 3 cm from the nipple. Ultrasound guided core biopsy was recommended and performed. This revealed a microscopic focus suspicious for ductal carcinoma. However it was very tiny and prognostic panel could not be performed. Discussing this with Dr. Lyndon Code there is some definite chance that this is not malignant. Of note lymph nodes were normal on ultrasound. She has no previous history of breast problems or breast disease. No hormonal therapy. No nipple discharge or skin changes.    Past Surgical History (Tanisha A. Owens Shark, [place]; 05/26/2017 9:08 AM) Appendectomy  Colon Polyp Removal - Colonoscopy  Gallbladder Surgery - Laparoscopic  Hip Surgery  Bilateral. Hysterectomy (not due to cancer) - Complete  Knee Surgery  Bilateral. Thyroid Surgery  Tonsillectomy   Diagnostic Studies History (Tanisha A. Owens Shark, [place]; 05/26/2017 9:08 AM) Colonoscopy  5-10 years ago Mammogram  within last year Pap Smear  1-5 years ago  Allergies (Tanisha A. Owens Shark, [place]; 05/26/2017 9:10 AM) Codeine Sulfate *ANALGESICS - OPIOID*  EPINEPHrine *Vasopressors**  Tenormin *BETA BLOCKERS*  Allergies Reconciled   Medication History (Tanisha A. Owens Shark, [place]; 05/26/2017 9:10 AM) Aspirin (81MG  Tablet, Oral) Active. HydroCHLOROthiazide (25MG  Tablet, Oral) Active. Levothyroxine Sodium (112MCG Tablet, Oral)  Active. Medications Reconciled  Social History (Tanisha A. Owens Shark, [place]; 05/26/2017 9:08 AM) Caffeine use  Coffee. No alcohol use  No drug use  Tobacco use  Former smoker.  Family History (Tanisha A. Owens Shark, [place]; 05/26/2017 9:08 AM) Cerebrovascular Accident  Father. Colon Polyps  Father. Diabetes Mellitus  Father. Hypertension  Father. Respiratory Condition  Father. Thyroid problems  Brother, Mother, Sister.  Pregnancy / Birth History (Tanisha A. Owens Shark, [place]; 05/26/2017 9:08 AM) Age at menarche  54 years. Age of menopause  65-50 Gravida  2 Maternal age  77-20 Para  2  Other Problems (Tanisha A. Owens Shark, [place]; 05/26/2017 9:08 AM) Atrial Fibrillation  High blood pressure  Melanoma  Thyroid Disease     Review of Systems (Tanisha A. Brown RMA; 05/26/2017 9:08 AM) General Present- Weight Loss. Not Present- Appetite Loss, Chills, Fatigue, Fever, Night Sweats and Weight Gain. HEENT Present- Wears glasses/contact lenses. Not Present- Earache, Hearing Loss, Hoarseness, Nose Bleed, Oral Ulcers, Ringing in the Ears, Seasonal Allergies, Sinus Pain, Sore Throat, Visual Disturbances and Yellow Eyes. Cardiovascular Present- Palpitations. Not Present- Chest Pain, Difficulty Breathing Lying Down, Leg Cramps, Rapid Heart Rate, Shortness of Breath and Swelling of Extremities. Gastrointestinal Present- Constipation. Not Present- Abdominal Pain, Bloating, Bloody Stool, Change in Bowel Habits, Chronic diarrhea, Difficulty Swallowing, Excessive gas, Gets full quickly at meals, Hemorrhoids, Indigestion, Nausea, Rectal Pain and Vomiting. Female Genitourinary Not Present- Frequency, Nocturia, Painful Urination, Pelvic Pain and Urgency. Musculoskeletal Present- Joint Pain. Not Present- Back Pain, Joint Stiffness, Muscle Pain, Muscle Weakness and Swelling of Extremities.  Vitals (Tanisha A. Brown RMA; 05/26/2017 9:09 AM) 05/26/2017 9:09 AM Weight: 141.4 lb Height: 63in Body Surface Area:  1.67 m Body Mass Index: 25.05 kg/m  Temp.: 98.79F  Pulse: 78 (Regular)  BP: 136/82 (Sitting, Left Arm, Standard)       Physical Exam Marland Kitchen T. Kamau Weatherall MD; 05/26/2017 9:53 AM) The physical exam findings are as follows: Note:General: Alert, elderly Caucasian female, in no distress Skin: Warm and dry without rash or infection. HEENT: No palpable masses or thyromegaly. Sclera nonicteric. Breasts: Some bruising medial left breast post biopsy. I cannot feel any mass in either breast. The skin changes or nipple discharge. Lymph nodes: No cervical, supraclavicular, nodes palpable. Lungs: Breath sounds clear and equal. No wheezing or increased work of breathing. Cardiovascular: Regular rate and rhythm without murmer. Trace edema Peripheral pulses intact. No carotid bruits. Abdomen: Nondistended. Soft and nontender. No masses palpable. No organomegaly. No palpable hernias. Extremities: Arthritic deformities of both hands. Limited range of motion left elbow. Also gets up and down on the exam table with some difficulty. Neurologic: Alert and fully oriented. Gait normal. No focal weakness. Psychiatric: Normal mood and affect. Thought content appropriate with normal judgement and insight    Assessment & Plan Marland Kitchen T. Zymeir Salminen MD; 05/26/2017 9:54 AM) MASS OF LEFT BREAST ON MAMMOGRAM (N63.20) Impression: Left breast mass medially 9:00 7 mm core biopsy showing a tiny focus suspicious for invasive cancer but after discussion with the pathologist not diagnostic. I discussed this with the patient and her husband. I recommend proceeding with radioactive seed localized left breast lumpectomy under general anesthesia as an outpatient. We discussed the risks of general anesthesia, wound healing, bleeding or infection and possible need for further surgery based on final pathology. They understand and agree to proceed.

## 2017-05-30 NOTE — Discharge Instructions (Signed)
[facility] °Office Phone Number [phone] ° °BREAST BIOPSY/ PARTIAL MASTECTOMY: POST OP INSTRUCTIONS ° °Always review your discharge instruction sheet given to you by the facility where your surgery was performed. ° °IF YOU HAVE DISABILITY OR FAMILY LEAVE FORMS, YOU MUST BRING THEM TO THE OFFICE FOR PROCESSING.  DO NOT GIVE THEM TO YOUR DOCTOR. ° °1. A prescription for pain medication may be given to you upon discharge.  Take your pain medication as prescribed, if needed.  If narcotic pain medicine is not needed, then you may take acetaminophen (Tylenol) or ibuprofen (Advil) as needed. °2. Take your usually prescribed medications unless otherwise directed °3. If you need a refill on your pain medication, please contact your pharmacy.  They will contact our office to request authorization.  Prescriptions will not be filled after 5pm or on week-ends. °4. You should eat very light the first 24 hours after surgery, such as soup, crackers, pudding, etc.  Resume your normal diet the day after surgery. °5. Most patients will experience some swelling and bruising in the breast.  Ice packs and a good support bra will help.  Swelling and bruising can take several days to resolve.  °6. It is common to experience some constipation if taking pain medication after surgery.  Increasing fluid intake and taking a stool softener will usually help or prevent this problem from occurring.  A mild laxative (Milk of Magnesia or Miralax) should be taken according to package directions if there are no bowel movements after 48 hours. °7. Unless discharge instructions indicate otherwise, you may remove your bandages 24-48 hours after surgery, and you may shower at that time.  You may have steri-strips (small skin tapes) in place directly over the incision.  These strips should be left on the skin for 7-10 days.  If your surgeon used skin glue on the incision, you may shower in 24 hours.  The glue will flake off over the  next 2-3 weeks.  Any sutures or staples will be removed at the office during your follow-up visit. °8. ACTIVITIES:  You may resume regular daily activities (gradually increasing) beginning the next day.  Wearing a good support bra or sports bra minimizes pain and swelling.  You may have sexual intercourse when it is comfortable. °a. You may drive when you no longer are taking prescription pain medication, you can comfortably wear a seatbelt, and you can safely maneuver your car and apply brakes. °b. RETURN TO WORK:  ______________________________________________________________________________________ °9. You should see your doctor in the office for a follow-up appointment approximately two weeks after your surgery.  Your doctor’s nurse will typically make your follow-up appointment when she calls you with your pathology report.  Expect your pathology report 2-3 business days after your surgery.  You may call to check if you do not hear from us after three days. °10. OTHER INSTRUCTIONS: _______________________________________________________________________________________________ _____________________________________________________________________________________________________________________________________ °_____________________________________________________________________________________________________________________________________ °_____________________________________________________________________________________________________________________________________ ° °WHEN TO CALL YOUR DOCTOR: °1. Fever over 101.0 °2. Nausea and/or vomiting. °3. Extreme swelling or bruising. °4. Continued bleeding from incision. °5. Increased pain, redness, or drainage from the incision. ° °The clinic staff is available to answer your questions during regular business hours.  Please don’t hesitate to call and ask to speak to one of the nurses for clinical concerns.  If you have a medical emergency, go to the nearest  emergency room or call 911.  A surgeon from [facility] is always on call at the hospital. ° °For further questions, please visit [email or web address]  ° ° ° ° °  Post Anesthesia Home Care Instructions ° °Activity: °Get plenty of rest for the remainder of the day. A responsible individual must stay with you for 24 hours following the procedure.  °For the next 24 hours, DO NOT: °-Drive a car °-Operate machinery °-Drink alcoholic beverages °-Take any medication unless instructed by your physician °-Make any legal decisions or sign important papers. ° °Meals: °Start with liquid foods such as gelatin or soup. Progress to regular foods as tolerated. Avoid greasy, spicy, heavy foods. If nausea and/or vomiting occur, drink only clear liquids until the nausea and/or vomiting subsides. Call your physician if vomiting continues. ° °Special Instructions/Symptoms: °Your throat may feel dry or sore from the anesthesia or the breathing tube placed in your throat during surgery. If this causes discomfort, gargle with warm salt water. The discomfort should disappear within 24 hours. ° °If you had a scopolamine patch placed behind your ear for the management of post- operative nausea and/or vomiting: ° °1. The medication in the patch is effective for 72 hours, after which it should be removed.  Wrap patch in a tissue and discard in the trash. Wash hands thoroughly with soap and water. °2. You may remove the patch earlier than 72 hours if you experience unpleasant side effects which may include dry mouth, dizziness or visual disturbances. °3. Avoid touching the patch. Wash your hands with soap and water after contact with the patch. °  ° °

## 2017-05-30 NOTE — Op Note (Signed)
Preoperative Diagnosis: LEFT BREAST MASS  Postoprative Diagnosis: LEFT BREAST MASS  Procedure: Procedure(s): LEFT BREAST LUMPECTOMY WITH RADIOACTIVE SEED LOCALIZATION   Surgeon: Excell Seltzer T   Assistants: None  Anesthesia:  General LMA anesthesia  Indications: Patient is a 81 year old female with a recent small mass detected in the lower inner left breast.  Large core needle biopsy was recommended and accepted.  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  The pathologist cannot be certain this represents invasive carcinoma.  After discussion with the patient and her husband with this finding I recommended proceeding with radioactive seed localized left breast lumpectomy to excise this area for diagnosis and hopefully treatment.  The procedure and indications and risks have been discussed with the patient detailed elsewhere.    Procedure Detail: Patient had previously undergone accurate placement of radioactive seed in the left breast at the clip site.  She was taken to the operating room, placed in the supine position on the operating table, and general LMA anesthesia induced.  The left breast was widely sterilely prepped and draped.  She received preoperative IV antibiotics.  Patient timeout was performed and correct procedure verified.  The neoprobe was used to localize the seed in the lower inner left breast which was very superficial.  I used a transversely oriented incision in a skin crease and took a small ellipse of skin over the area of high counts.  Dissection was deepened into the subcutaneous and breast tissue circumferentially and approximately 2-2-1/2 cm globular specimen of breast tissue removed.  The specimen was inked for margins.  Specimen x-ray showed the marking clip and seed contained within the specimen.  There were no palpable abnormalities in the remaining breast tissue.  Complete  hemostasis was assured.  Soft tissue was infiltrated with Marcaine.  The lumpectomy cavity was marked with clips.  Deep breast and subcutaneous tissue was closed with interrupted 3-0 Vicryl and the skin closed with running subcuticular 5-0 Monocryl and Dermabond.  Sponge needle and instrument counts were correct.    Findings: Above  Estimated Blood Loss:  Minimal         Drains: None  Blood Given: none          Specimens: Left breast lumpectomy oriented with ink        Complications:  * No complications entered in OR log *         Disposition: PACU - hemodynamically stable.         Condition: stable

## 2017-05-30 NOTE — Anesthesia Postprocedure Evaluation (Signed)
Anesthesia Post Note  Patient: Heidi Adams  Procedure(s) Performed: LEFT BREAST LUMPECTOMY WITH RADIOACTIVE SEED LOCALIZATION (Left Breast)     Patient location during evaluation: PACU Anesthesia Type: General Level of consciousness: awake and alert Pain management: pain level controlled Vital Signs Assessment: post-procedure vital signs reviewed and stable Respiratory status: spontaneous breathing, nonlabored ventilation, respiratory function stable and patient connected to nasal cannula oxygen Cardiovascular status: blood pressure returned to baseline and stable Postop Assessment: no apparent nausea or vomiting Anesthetic complications: no    Last Vitals:  Vitals:   05/30/17 1451 05/30/17 1545  BP:  (!) 187/78  Pulse: 65 69  Resp: 18 18  Temp:  37.3 C  SpO2: 97% 100%    Last Pain:  Vitals:   05/30/17 1545  TempSrc:   PainSc: 2                  Zuriel Roskos P Loras Grieshop

## 2017-05-30 NOTE — Transfer of Care (Signed)
Immediate Anesthesia Transfer of Care Note  Patient: Heidi Adams  Procedure(s) Performed: LEFT BREAST LUMPECTOMY WITH RADIOACTIVE SEED LOCALIZATION (Left Breast)  Patient Location: PACU  Anesthesia Type:General  Level of Consciousness: awake and patient cooperative  Airway & Oxygen Therapy: Patient Spontanous Breathing and Patient connected to face mask oxygen  Post-op Assessment: Report given to RN and Post -op Vital signs reviewed and stable  Post vital signs: Reviewed and stable  Last Vitals:  Vitals:   05/30/17 1111 05/30/17 1138  BP: (!) 149/72   Pulse: 68 68  Resp: 18 18  Temp: 36.8 C 36.8 C  SpO2: 99% 99%    Last Pain:  Vitals:   05/30/17 1138  TempSrc: Oral  PainSc: 3          Complications: No apparent anesthesia complications

## 2017-05-30 NOTE — Anesthesia Procedure Notes (Signed)
Procedure Name: LMA Insertion Date/Time: 05/30/2017 1:21 PM Performed by: Marrianne Mood, CRNA Pre-anesthesia Checklist: Patient identified, Emergency Drugs available, Suction available and Patient being monitored Patient Re-evaluated:Patient Re-evaluated prior to induction Oxygen Delivery Method: Circle system utilized Preoxygenation: Pre-oxygenation with 100% oxygen Induction Type: IV induction Ventilation: Mask ventilation without difficulty LMA: LMA inserted LMA Size: 4.0 Number of attempts: 1 Airway Equipment and Method: Bite block Placement Confirmation: positive ETCO2 Tube secured with: Tape Dental Injury: Teeth and Oropharynx as per pre-operative assessment

## 2017-05-30 NOTE — Anesthesia Preprocedure Evaluation (Addendum)
Anesthesia Evaluation  Patient identified by MRN, date of birth, ID band Patient awake    Reviewed: Allergy & Precautions, NPO status , Patient's Chart, lab work & pertinent test results  Airway Mallampati: II  TM Distance: >3 FB Neck ROM: Full    Dental no notable dental hx.    Pulmonary former smoker,    Pulmonary exam normal breath sounds clear to auscultation       Cardiovascular hypertension, Pt. on medications Normal cardiovascular exam Rhythm:Regular Rate:Normal  ECG: SR, PAC's, rate 71  Sees cardiologist  A-fib s/p hip sx   Neuro/Psych negative neurological ROS  negative psych ROS   GI/Hepatic Neg liver ROS, GERD  Medicated and Controlled,  Endo/Other  Hypothyroidism   Renal/GU negative Renal ROS     Musculoskeletal negative musculoskeletal ROS (+)   Abdominal   Peds  Hematology negative hematology ROS (+)   Anesthesia Other Findings   Reproductive/Obstetrics                            Anesthesia Physical  Anesthesia Plan  ASA: III  Anesthesia Plan: General   Post-op Pain Management:    Induction: Intravenous  PONV Risk Score and Plan: 3 and Ondansetron and Dexamethasone  Airway Management Planned: LMA  Additional Equipment:   Intra-op Plan:   Post-operative Plan: Extubation in OR  Informed Consent: I have reviewed the patients History and Physical, chart, labs and discussed the procedure including the risks, benefits and alternatives for the proposed anesthesia with the patient or authorized representative who has indicated his/her understanding and acceptance.   Dental advisory given  Plan Discussed with: CRNA  Anesthesia Plan Comments:         Anesthesia Quick Evaluation

## 2017-05-31 ENCOUNTER — Encounter (HOSPITAL_BASED_OUTPATIENT_CLINIC_OR_DEPARTMENT_OTHER): Payer: Self-pay | Admitting: General Surgery

## 2017-05-31 NOTE — Addendum Note (Signed)
Addendum  created 05/31/17 4076 by Chyrl Elwell, Ernesta Amble, CRNA   Intraprocedure Staff edited

## 2017-06-01 DIAGNOSIS — R0609 Other forms of dyspnea: Secondary | ICD-10-CM | POA: Diagnosis not present

## 2017-06-01 DIAGNOSIS — I10 Essential (primary) hypertension: Secondary | ICD-10-CM | POA: Diagnosis not present

## 2017-06-01 DIAGNOSIS — E038 Other specified hypothyroidism: Secondary | ICD-10-CM | POA: Diagnosis not present

## 2017-06-01 DIAGNOSIS — C50919 Malignant neoplasm of unspecified site of unspecified female breast: Secondary | ICD-10-CM | POA: Diagnosis not present

## 2017-06-01 DIAGNOSIS — I480 Paroxysmal atrial fibrillation: Secondary | ICD-10-CM | POA: Diagnosis not present

## 2017-06-01 DIAGNOSIS — Z6825 Body mass index (BMI) 25.0-25.9, adult: Secondary | ICD-10-CM | POA: Diagnosis not present

## 2017-06-07 ENCOUNTER — Telehealth: Payer: Self-pay | Admitting: Hematology and Oncology

## 2017-06-07 ENCOUNTER — Encounter: Payer: Self-pay | Admitting: Hematology and Oncology

## 2017-06-07 ENCOUNTER — Encounter: Payer: Self-pay | Admitting: Radiation Oncology

## 2017-06-07 NOTE — Telephone Encounter (Signed)
Appt has been scheduled for the pt to see Dr. Lindi Adie on 11/27 at 1pm. I offered and earlier date and time, but pt declined due to conflict with other appointments. Letter mailed.

## 2017-06-11 ENCOUNTER — Encounter: Payer: Self-pay | Admitting: Radiation Oncology

## 2017-06-11 NOTE — Progress Notes (Signed)
Location of Breast Cancer: Left Breast Upper Inner Quadrant(47m mass)  Histology per Pathology Report: Diagnosis 05/15/2017: Breast, left, needle core biopsy, 9 o'clock, 3 cmfn - MICROSCOPIC FOCUS SUSPICIOUS FOR DUCTAL CARCINOMA  Receptor Status: ER(100%+), PR (50%+), Her2-neu (neg.), Ki-(5%)  Did patient present with symptoms (if so, please note symptoms) or was this found on screening mammography?: patient felt something in bed felt like a small marble, made appt for mammogram  Past/Anticipated interventions by surgeon, if any:Dr. Hoxworth,Benjamin,MD Diagnosis 05/30/2017: Breast, lumpectomy, left - INVASIVE DUCTAL CARCINOMA, GRADE I/III, SPANNING 0.8 CM.- DUCTAL CARCINOMA IN SITU, LOW GRADE.- DUCTAL CARCINOMA IN SITU IS FOCALLY 0.1 CM TO THE SUPERIOR MARGIN  Past/Anticipated interventions by medical oncology, if any: Chemotherapy :Dr. GLindi Adieappt 06/20/17  Lymphedema issues, if any: No  Pain issues, if any:  Right hand needs surgery  SAFETY ISSUES: YES, walks with a cane r hip replacement  Prior radiation?  No  Pacemaker/ICD?  NO  Is the patient on methotrexate? NO  Current Complaints / other details:  Menarche age13,G2,P2,hxA-Fib,HTN,Melanoma,Thyroid disease,former cigarette smoker,no alcohol,drug or smokeless tobacco use,  Father CVA,Colon Polyps,HTN,Respiratory,Mother Brother,sister=Thyroid problems  Allergies: Codeine sulfate,epinephrine,vasopresors;Tenormin Beta blockers     MRebecca Eaton RN 06/11/2017,8:08 AM BP 137/72   Pulse 61   Temp 98.1 F (36.7 C) (Oral)   Resp 20   Ht 5' 3"  (1.6 m)   Wt 140 lb 9.6 oz (63.8 kg)   LMP 07/25/1988 Comment: full   BMI 24.91 kg/m   Wt Readings from Last 3 Encounters:  06/21/17 140 lb 9.6 oz (63.8 kg)  06/20/17 141 lb 8 oz (64.2 kg)  05/30/17 139 lb 6.4 oz (63.2 kg)

## 2017-06-12 DIAGNOSIS — N390 Urinary tract infection, site not specified: Secondary | ICD-10-CM | POA: Diagnosis not present

## 2017-06-12 DIAGNOSIS — D649 Anemia, unspecified: Secondary | ICD-10-CM | POA: Diagnosis not present

## 2017-06-12 DIAGNOSIS — E038 Other specified hypothyroidism: Secondary | ICD-10-CM | POA: Diagnosis not present

## 2017-06-12 DIAGNOSIS — I480 Paroxysmal atrial fibrillation: Secondary | ICD-10-CM | POA: Diagnosis not present

## 2017-06-12 DIAGNOSIS — R8290 Unspecified abnormal findings in urine: Secondary | ICD-10-CM | POA: Diagnosis not present

## 2017-06-12 DIAGNOSIS — C50919 Malignant neoplasm of unspecified site of unspecified female breast: Secondary | ICD-10-CM | POA: Diagnosis not present

## 2017-06-12 DIAGNOSIS — Z6825 Body mass index (BMI) 25.0-25.9, adult: Secondary | ICD-10-CM | POA: Diagnosis not present

## 2017-06-12 DIAGNOSIS — E668 Other obesity: Secondary | ICD-10-CM | POA: Diagnosis not present

## 2017-06-12 DIAGNOSIS — I10 Essential (primary) hypertension: Secondary | ICD-10-CM | POA: Diagnosis not present

## 2017-06-12 DIAGNOSIS — N3281 Overactive bladder: Secondary | ICD-10-CM | POA: Diagnosis not present

## 2017-06-12 DIAGNOSIS — R0609 Other forms of dyspnea: Secondary | ICD-10-CM | POA: Diagnosis not present

## 2017-06-20 ENCOUNTER — Ambulatory Visit: Payer: PPO | Admitting: Hematology and Oncology

## 2017-06-20 ENCOUNTER — Ambulatory Visit (HOSPITAL_BASED_OUTPATIENT_CLINIC_OR_DEPARTMENT_OTHER): Payer: PPO | Admitting: Hematology and Oncology

## 2017-06-20 ENCOUNTER — Telehealth: Payer: Self-pay | Admitting: Hematology and Oncology

## 2017-06-20 ENCOUNTER — Encounter: Payer: Self-pay | Admitting: *Deleted

## 2017-06-20 DIAGNOSIS — Z170 Estrogen receptor positive status [ER+]: Secondary | ICD-10-CM

## 2017-06-20 DIAGNOSIS — C50212 Malignant neoplasm of upper-inner quadrant of left female breast: Secondary | ICD-10-CM

## 2017-06-20 NOTE — Assessment & Plan Note (Addendum)
05/30/2017: Left lumpectomy: IDC grade 1, 0.8 cm, with low-grade DCIS, ER 100%, PR 50%, Ki-67 5%, HER-2 negative ratio 1.5, T1BN0 stage I a  Pathology and radiology counseling: Discussed with the patient, the details of pathology including the type of breast cancer,the clinical staging, the significance of ER, PR and HER-2/neu receptors and the implications for treatment. After reviewing the pathology in detail, we proceeded to discuss the different treatment options between surgery, radiation,and antiestrogen therapies.  Recommendation: 1.  Discussion regarding adjuvant radiation 2. followed by antiestrogen therapy with letrozole 2.5 mg daily times 5 years  Letrozole counseling:We discussed the risks and benefits of anti-estrogen therapy with aromatase inhibitors. These include but not limited to insomnia, hot flashes, mood changes, vaginal dryness, bone density loss, and weight gain. We strongly believe that the benefits far outweigh the risks. Patient understands these risks and consented to starting treatment. Planned treatment duration is 5 years.

## 2017-06-20 NOTE — Progress Notes (Signed)
[facility] CONSULT NOTE  Patient Care Team: [name], MD as PCP - General (Internal Medicine)  CHIEF COMPLAINTS/PURPOSE OF CONSULTATION:  Newly diagnosed breast cancer  HISTORY OF PRESENTING ILLNESS:  Heidi Adams 81 y.o. female is here because of recent diagnosis of left breast cancer.  Patient felt a lump in the left breast and brought to the attention of her physicians.  She underwent a mammogram and ultrasound that detected a small nodule in the left breast.  She subsequently saw Dr. Excell Seltzer.  She underwent a biopsy which was suspicious for breast cancer.  She underwent resection with a lumpectomy on 05/30/2017.  Final pathology came back as 8 mm of invasive ductal carcinoma with DCIS that was ER PR positive HER-2 negative with a Ki-67 of 5%.  She was sent to [place] for discussion regarding adjuvant treatment options. Patient is in a wheelchair with extensive issues with joints.  She has had both knees and both hips replaced.  She tells me that the left hip spontaneously comes out and cause excruciating pain periodically.  She also has problems with her wrists and she is unable to grip anything.  She may have to undergo some surgery on her wrist as well.  I reviewed her records extensively and collaborated the history with the patient.  SUMMARY OF ONCOLOGIC HISTORY:   Malignant neoplasm of upper-inner quadrant of left breast in female, estrogen receptor positive ([place])   05/15/2017 Initial Diagnosis    Malignant neoplasm of upper-inner quadrant of left breast in female, estrogen receptor positive ([place]): Microscopic focus suspicious for IDC low-grade      05/30/2017 Surgery    Left lumpectomy: IDC grade 1, 0.8 cm, with low-grade DCIS, ER 100%, PR 50%, Ki-67 5%, HER-2 negative ratio 1.5, T1BN0 stage I a      MEDICAL HISTORY:  Past Medical History:  Diagnosis Date  . Allergy   . Anemia    hx of anemia  . Arthritis   . Breast cancer ([place])    Left breast  . Cancer  ([place])    skin ca nose  . Dysrhythmia    went into Atrial Fibrillation after last surgery, HAS PALPITATIONS  . History of skin cancer   . Hypertension   . Hypothyroidism   . PVC (premature ventricular contraction)   . Thyroiditis 1973    SURGICAL HISTORY: Past Surgical History:  Procedure Laterality Date  . ABDOMINAL HYSTERECTOMY  89   TAH BSO  . APPENDECTOMY    . BREAST LUMPECTOMY WITH RADIOACTIVE SEED LOCALIZATION Left 05/30/2017   Procedure: LEFT BREAST LUMPECTOMY WITH RADIOACTIVE SEED LOCALIZATION;  Surgeon: Excell Seltzer, MD;  Location: [facility];  Service: General;  Laterality: Left;  . CHOLECYSTECTOMY     dr Georgia Lopes  . ELBOW SURGERY    . EYE SURGERY     "on my eyelids" years ago  . KNEE ARTHROSCOPY     left  . THYROIDECTOMY  1973  . TONSILLECTOMY    . TOTAL HIP ARTHROPLASTY Right 05/10/2016   Procedure: TOTAL HIP ARTHROPLASTY ANTERIOR APPROACH;  Surgeon: Paralee Cancel, MD;  Location: [facility];  Service: Orthopedics;  Laterality: Right;  . TOTAL HIP ARTHROPLASTY Left 01/10/2017   Procedure: LEFT TOTAL HIP ARTHROPLASTY ANTERIOR APPROACH;  Surgeon: Paralee Cancel, MD;  Location: [facility];  Service: Orthopedics;  Laterality: Left;  . TOTAL KNEE ARTHROPLASTY Left 09/28/2015   Procedure: LEFT TOTAL KNEE ARTHROPLASTY;  Surgeon: Paralee Cancel, MD;  Location: [facility];  Service: Orthopedics;  Laterality: Left;  . TOTAL KNEE ARTHROPLASTY Right 08/09/2016   Procedure: RIGHT TOTAL KNEE ARTHROPLASTY;  Surgeon: Paralee Cancel, MD;  Location: [facility];  Service: Orthopedics;  Laterality: Right;  Adductor Block  . Total Left hip arthroplasty     01/10/17 Dr. Alvan Dame    SOCIAL HISTORY: Social History   Socioeconomic History  . Marital status: Married    Spouse name: Not on file  . Number of children: Not on file  . Years of education: Not on file  . Highest education level: Not on file  Social Needs  . Financial resource strain: Not on file  . Food insecurity - worry: Not on  file  . Food insecurity - inability: Not on file  . Transportation needs - medical: Not on file  . Transportation needs - non-medical: Not on file  Occupational History  . Not on file  Tobacco Use  . Smoking status: Former Smoker    Last attempt to quit: 07/26/1987    Years since quitting: 29.9  . Smokeless tobacco: Never Used  Substance and Sexual Activity  . Alcohol use: No    Alcohol/week: 0.0 oz  . Drug use: No  . Sexual activity: Not Currently    Comment: 1st intercourse 44 yo-1 partner  Other Topics Concern  . Not on file  Social History Narrative  . Not on file    FAMILY HISTORY: Family History  Problem Relation Age of Onset  . Diabetes Father   . Hypertension Father   . Stroke Father     ALLERGIES:  is allergic to codeine; epinephrine; and tenormin [atenolol].  MEDICATIONS:  Current Outpatient Medications  Medication Sig Dispense Refill  . acetaminophen (TYLENOL) 500 MG tablet Take 1,000 mg once by mouth.    . celecoxib (CELEBREX) 200 MG capsule Take 200 mg by mouth daily.    . hydrochlorothiazide (HYDRODIURIL) 25 MG tablet Take 12.5 mg by mouth daily.    Marland Kitchen levothyroxine (SYNTHROID, LEVOTHROID) 112 MCG tablet Take 112 mcg by mouth daily before breakfast.    . nadolol (CORGARD) 20 MG tablet Take 20 mg by mouth daily.    . pantoprazole (PROTONIX) 40 MG tablet Take 40 mg by mouth daily as needed (indigestion).     Marland Kitchen senna (SENOKOT) 8.6 MG tablet Take 34.4 tablets by mouth daily as needed for constipation.     . traMADol (ULTRAM) 50 MG tablet Take 1 tablet (50 mg total) every 6 (six) hours as needed by mouth. 10 tablet 1   No current facility-administered medications for this visit.     REVIEW OF SYSTEMS:   Constitutional: Denies fevers, chills or abnormal night sweats Eyes: Denies blurriness of vision, double vision or watery eyes Ears, nose, mouth, throat, and face: Denies mucositis or sore throat Respiratory: Denies cough, dyspnea or wheezes Cardiovascular:  Denies palpitation, chest discomfort or lower extremity swelling Gastrointestinal:  Denies nausea, heartburn or change in bowel habits Skin: Denies abnormal skin rashes Lymphatics: Denies new lymphadenopathy or easy bruising Neurological:Denies numbness, tingling or new weaknesses, wheelchair-bound because of diffuse arthritis, patient had knees and hips replaced. Behavioral/Psych: Mood is stable, no new changes  Breast: Recent lumpectomy All other systems were reviewed with the patient and are negative.  PHYSICAL EXAMINATION: ECOG PERFORMANCE STATUS: 3 - Symptomatic, >50% confined to bed  Vitals:   06/20/17 1016  BP: (!) 167/90  Pulse: 75  Resp: 16  Temp: 97.6 F (36.4 C)  SpO2: 99%   Filed Weights   06/20/17 1016  Weight:  141 lb 8 oz (64.2 kg)    GENERAL:alert, no distress and comfortable SKIN: skin color, texture, turgor are normal, no rashes or significant lesions EYES: normal, conjunctiva are pink and non-injected, sclera clear OROPHARYNX:no exudate, no erythema and lips, buccal mucosa, and tongue normal  NECK: supple, thyroid normal size, non-tender, without nodularity LYMPH:  no palpable lymphadenopathy in the cervical, axillary or inguinal LUNGS: clear to auscultation and percussion with normal breathing effort HEART: regular rate & rhythm and no murmurs and no lower extremity edema ABDOMEN:abdomen soft, non-tender and normal bowel sounds Musculoskeletal:no cyanosis of digits and no clubbing  PSYCH: alert & oriented x 3 with fluent speech NEURO: no focal motor/sensory deficits   LABORATORY DATA:  I have reviewed the data as listed Lab Results  Component Value Date   WBC 7.7 02/03/2017   HGB 10.2 (L) 02/03/2017   HCT 33.1 (L) 02/03/2017   MCV 89.5 02/03/2017   PLT 239 02/03/2017   Lab Results  Component Value Date   NA 139 05/26/2017   K 4.2 05/26/2017   CL 106 05/26/2017   CO2 26 05/26/2017    RADIOGRAPHIC STUDIES: I have personally reviewed the  radiological reports and agreed with the findings in the report.  ASSESSMENT AND PLAN:  Malignant neoplasm of upper-inner quadrant of left breast in female, estrogen receptor positive ([place]) 05/30/2017: Left lumpectomy: IDC grade 1, 0.8 cm, with low-grade DCIS, ER 100%, PR 50%, Ki-67 5%, HER-2 negative ratio 1.5, T1BN0 stage I a  Pathology and radiology counseling: Discussed with the patient, the details of pathology including the type of breast cancer,the clinical staging, the significance of ER, PR and HER-2/neu receptors and the implications for treatment. After reviewing the pathology in detail, we proceeded to discuss the different treatment options between surgery, radiation,and antiestrogen therapies.  Recommendation: 1.  Discussion regarding adjuvant radiation, patient to see radiation oncology tomorrow 2. followed by antiestrogen therapy with letrozole 2.5 mg daily times 5 years  I briefly discussed with her that the role of adjuvant radiation is limited given her favorable prognostic features.  However it could be up to radiation oncology to make a final decision.  Letrozole counseling:We discussed the risks and benefits of anti-estrogen therapy with aromatase inhibitors. These include but not limited to insomnia, hot flashes, mood changes, vaginal dryness, bone density loss, and weight gain. We strongly believe that the benefits far outweigh the risks. Patient understands these risks and consented to starting treatment. Planned treatment duration is 5 years.  Patient is not very keen on antiestrogen therapy either.  I understand that because of her severe arthritis and her performance status, she is anxious about taking other medications that could increase her joint pains.  And she would like to think about it and call us back.  I instructed her that if she were to decide to take the medicine she should start it in January after the holidays.  Return to clinic if April will follow up with  me.  After that we can see her once a year.  All questions were answered. The patient knows to call the clinic with any problems, questions or concerns.    Rulon Eisenmenger, MD 06/20/17

## 2017-06-20 NOTE — Telephone Encounter (Signed)
Gave patient AVS and calendar of upcoming April 2019 appointments. °

## 2017-06-21 ENCOUNTER — Encounter: Payer: Self-pay | Admitting: Radiation Oncology

## 2017-06-21 ENCOUNTER — Ambulatory Visit
Admission: RE | Admit: 2017-06-21 | Discharge: 2017-06-21 | Disposition: A | Discharge status: Home / Self Care | Payer: PPO | Source: Ambulatory Visit | Attending: Radiation Oncology | Admitting: Radiation Oncology

## 2017-06-21 VITALS — BP 137/72 | HR 61 | Temp 98.1°F | Resp 20 | Ht 63.0 in | Wt 140.6 lb

## 2017-06-21 DIAGNOSIS — R42 Dizziness and giddiness: Secondary | ICD-10-CM | POA: Diagnosis not present

## 2017-06-21 DIAGNOSIS — D0512 Intraductal carcinoma in situ of left breast: Secondary | ICD-10-CM | POA: Insufficient documentation

## 2017-06-21 DIAGNOSIS — C50212 Malignant neoplasm of upper-inner quadrant of left female breast: Secondary | ICD-10-CM

## 2017-06-21 DIAGNOSIS — E039 Hypothyroidism, unspecified: Secondary | ICD-10-CM | POA: Diagnosis not present

## 2017-06-21 DIAGNOSIS — Z170 Estrogen receptor positive status [ER+]: Principal | ICD-10-CM

## 2017-06-21 DIAGNOSIS — I491 Atrial premature depolarization: Secondary | ICD-10-CM | POA: Diagnosis not present

## 2017-06-21 DIAGNOSIS — R9082 White matter disease, unspecified: Secondary | ICD-10-CM | POA: Diagnosis not present

## 2017-06-21 DIAGNOSIS — Z85828 Personal history of other malignant neoplasm of skin: Secondary | ICD-10-CM | POA: Insufficient documentation

## 2017-06-21 DIAGNOSIS — I10 Essential (primary) hypertension: Secondary | ICD-10-CM | POA: Insufficient documentation

## 2017-06-21 DIAGNOSIS — G319 Degenerative disease of nervous system, unspecified: Secondary | ICD-10-CM | POA: Diagnosis not present

## 2017-06-21 DIAGNOSIS — Z7982 Long term (current) use of aspirin: Secondary | ICD-10-CM | POA: Diagnosis not present

## 2017-06-21 DIAGNOSIS — H5789 Other specified disorders of eye and adnexa: Secondary | ICD-10-CM | POA: Diagnosis not present

## 2017-06-21 DIAGNOSIS — R5383 Other fatigue: Secondary | ICD-10-CM | POA: Diagnosis not present

## 2017-06-21 DIAGNOSIS — N390 Urinary tract infection, site not specified: Secondary | ICD-10-CM | POA: Diagnosis not present

## 2017-06-21 DIAGNOSIS — Z9889 Other specified postprocedural states: Secondary | ICD-10-CM | POA: Diagnosis not present

## 2017-06-21 DIAGNOSIS — Z87891 Personal history of nicotine dependence: Secondary | ICD-10-CM | POA: Diagnosis not present

## 2017-06-21 DIAGNOSIS — R2681 Unsteadiness on feet: Secondary | ICD-10-CM | POA: Diagnosis not present

## 2017-06-21 DIAGNOSIS — Z79899 Other long term (current) drug therapy: Secondary | ICD-10-CM | POA: Diagnosis not present

## 2017-06-21 DIAGNOSIS — R0602 Shortness of breath: Secondary | ICD-10-CM | POA: Diagnosis not present

## 2017-06-21 HISTORY — DX: Allergy, unspecified, initial encounter: T78.40XA

## 2017-06-21 HISTORY — DX: Malignant neoplasm of unspecified site of unspecified female breast: C50.919

## 2017-06-21 NOTE — Progress Notes (Signed)
Please see the Nurse Progress Note in the MD Initial Consult Encounter for this patient. 

## 2017-06-21 NOTE — Progress Notes (Signed)
Radiation Oncology         [phone]) [phone] ________________________________  Name: Heidi Adams        MRN: [id number]  Date of Service: 06/21/2017 DOB: 05/11/1936  RX:[id number], Heidi Reichmann, MD  Excell Seltzer, MD     REFERRING PHYSICIAN: Excell Seltzer, MD   DIAGNOSIS: The encounter diagnosis was Malignant neoplasm of upper-inner quadrant of left breast in female, estrogen receptor positive ([place]).   Stage IA pT1b, pN0 invasive ductal carcinoma with low grade DCIS, ER(+) 100%, PR(+) 50%, HER2(-), Ki67 of 5%, Grade I/III  HISTORY OF PRESENT ILLNESS: Heidi Adams is a very pleasant 81 y.o. female who is seen for an initial consultation visit at the request of Dr Excell Seltzer with a new diagnosis of breast cancer. Initially, the patient palpated a mass within the left breast over the summer. She brought this to the attention of her primary care physician who ordered diagnostic mammography which was performed on 01/23/17 which revealed an indeterminate left breast mass which was suspicious for malignancy however fat necrosis was not excluded. US of the area which included the axilla was also performed at that time which did not demonstrate any evidence suggestive of malignancy. Following this, she established care with Dr Excell Seltzer who recommended biopsy of the area. This was performed on 05/15/17 which showed evidence of a microscopic focus of ductal carcinoma. She underwent left breast lumpectomy on the area on 05/30/17. Surgical pathology from this procedure confirmed the presence of invasive ductal carcinoma with associated DCIS. Histological analysis revealed her tumor to be ER positive at 100% with strong staining intensity, PR positive at 50% with moderate staining intensity, and HER2 negative. She was subsequently referred into radiation oncology for ongoing management of her breast cancer.   She did recently follow up with Dr Lindi Adie following her surgery who recommended that she proceed with  radiotherapy followed by Letrozole 2.25m antiestrogen oral therapy daily for 5 years.  She reports that since her surgery that she has been recovering well and without problems of fluid buildup, pain, or infection. She reports that she would not like to pursue radiation therapy if she would not need it.    PREVIOUS RADIATION THERAPY: No   PAST MEDICAL HISTORY:  Past Medical History:  Diagnosis Date  . Allergy   . Anemia    hx of anemia  . Arthritis   . Breast cancer ([facility]    Left breast  . Cancer ([facility]    skin ca nose  . Dysrhythmia    went into Atrial Fibrillation after last surgery, HAS PALPITATIONS  . History of skin cancer   . Hypertension   . Hypothyroidism   . PVC (premature ventricular contraction)   . Thyroiditis 1973       PAST SURGICAL HISTORY: Past Surgical History:  Procedure Laterality Date  . ABDOMINAL HYSTERECTOMY  89   TAH BSO  . APPENDECTOMY    . BREAST LUMPECTOMY WITH RADIOACTIVE SEED LOCALIZATION Left 05/30/2017   Procedure: LEFT BREAST LUMPECTOMY WITH RADIOACTIVE SEED LOCALIZATION;  Surgeon: HExcell Seltzer MD;  [facility]  Service: General;  Laterality: Left;  . CHOLECYSTECTOMY     dr kGeorgia Lopes . ELBOW SURGERY    . EYE SURGERY     "on my eyelids" years ago  . KNEE ARTHROSCOPY     left  . THYROIDECTOMY  1973  . TONSILLECTOMY    . TOTAL HIP ARTHROPLASTY Right 05/10/2016   Procedure: TOTAL HIP ARTHROPLASTY ANTERIOR APPROACH;  Surgeon: MRodman Key  Alvan Dame, MD;  Location: [facility];  Service: Orthopedics;  Laterality: Right;  . TOTAL HIP ARTHROPLASTY Left 01/10/2017   Procedure: LEFT TOTAL HIP ARTHROPLASTY ANTERIOR APPROACH;  Surgeon: Paralee Cancel, MD;  Location: [facility];  Service: Orthopedics;  Laterality: Left;  . TOTAL KNEE ARTHROPLASTY Left 09/28/2015   Procedure: LEFT TOTAL KNEE ARTHROPLASTY;  Surgeon: Paralee Cancel, MD;  Location: [facility];  Service: Orthopedics;  Laterality: Left;  . TOTAL KNEE ARTHROPLASTY Right 08/09/2016    Procedure: RIGHT TOTAL KNEE ARTHROPLASTY;  Surgeon: Paralee Cancel, MD;  Location: [facility];  Service: Orthopedics;  Laterality: Right;  Adductor Block  . Total Left hip arthroplasty     01/10/17 Dr. Alvan Dame     FAMILY HISTORY:  Family History  Problem Relation Age of Onset  . Diabetes Father   . Hypertension Father   . Stroke Father      SOCIAL HISTORY:  reports that she quit smoking about 29 years ago. she has never used smokeless tobacco. She reports that she does not drink alcohol or use drugs.   ALLERGIES: Codeine; Epinephrine; and Tenormin [atenolol]   MEDICATIONS:  Current Outpatient Medications  Medication Sig Dispense Refill  . acetaminophen (TYLENOL) 500 MG tablet Take 1,000 mg once by mouth.    Marland Kitchen aspirin EC 81 MG tablet Take 81 mg by mouth daily.    . cholecalciferol (VITAMIN D) 1000 units tablet Take 1,000 Units by mouth daily. 2098m    . hydrochlorothiazide (HYDRODIURIL) 25 MG tablet Take 12.5 mg by mouth daily.    .Marland Kitchenlevothyroxine (SYNTHROID, LEVOTHROID) 112 MCG tablet Take 112 mcg by mouth daily before breakfast.    . nadolol (CORGARD) 20 MG tablet Take 20 mg by mouth daily.    . celecoxib (CELEBREX) 200 MG capsule Take 200 mg by mouth daily.    . pantoprazole (PROTONIX) 40 MG tablet Take 40 mg by mouth daily as needed (indigestion).     .Marland Kitchensenna (SENOKOT) 8.6 MG tablet Take 34.4 tablets by mouth daily as needed for constipation.      No current facility-administered medications for this encounter.      REVIEW OF SYSTEMS: On review of systems, the patient reports that she is doing well overall. She denies any chest pain, shortness of breath, cough, fevers, chills, night sweats, unintended weight changes. She denies any bowel or bladder disturbances, and denies abdominal pain, nausea or vomiting. She denies any new musculoskeletal or joint aches or pains. A complete review of systems is obtained and is otherwise negative.     PHYSICAL EXAM:  Wt Readings from Last 3  Encounters:  06/21/17 140 lb 9.6 oz (63.8 kg)  06/20/17 141 lb 8 oz (64.2 kg)  05/30/17 139 lb 6.4 oz (63.2 kg)   Temp Readings from Last 3 Encounters:  06/21/17 98.1 F (36.7 C) (Oral)  06/20/17 97.6 F (36.4 C) (Oral)  05/30/17 99.1 F (37.3 C)   BP Readings from Last 3 Encounters:  06/21/17 137/72  06/20/17 (!) 167/90  05/30/17 (!) 187/78   Pulse Readings from Last 3 Encounters:  06/21/17 61  06/20/17 75  05/30/17 69   Pain Assessment Pain Score: 4  Pain Loc: Hand(right)/10  In general this is a well appearing caucasian female in no acute distress. She is alert and oriented x4 and appropriate throughout the examination. HEENT reveals that the patient is normocephalic, atraumatic. EOMs are intact. PERRLA. Cardiopulmonary assessment is negative for acute distress and she exhibits normal effort.     ECOG = 0  0 - Asymptomatic (Fully active, able to carry on all predisease activities without restriction)  1 - Symptomatic but completely ambulatory (Restricted in physically strenuous activity but ambulatory and able to carry out work of a light or sedentary nature. For example, light housework, office work)  2 - Symptomatic, <50% in bed during the day (Ambulatory and capable of all self care but unable to carry out any work activities. Up and about more than 50% of waking hours)  3 - Symptomatic, >50% in bed, but not bedbound (Capable of only limited self-care, confined to bed or chair 50% or more of waking hours)  4 - Bedbound (Completely disabled. Cannot carry on any self-care. Totally confined to bed or chair)  5 - Death   Eustace Pen MM, Creech RH, Tormey DC, et al. [phone]). "Toxicity and response criteria of the [facility]". [place] Oncol. 5 (6): 649-55    LABORATORY DATA:  Lab Results  Component Value Date   WBC 7.7 02/03/2017   HGB 10.2 (L) 02/03/2017   HCT 33.1 (L) 02/03/2017   MCV 89.5 02/03/2017   PLT 239 02/03/2017   Lab Results    Component Value Date   NA 139 05/26/2017   K 4.2 05/26/2017   CL 106 05/26/2017   CO2 26 05/26/2017   No results found for: ALT, AST, GGT, ALKPHOS, BILITOT    RADIOGRAPHY: Mm Breast Surgical Specimen  Result Date: 05/30/2017 CLINICAL DATA:  Status post lumpectomy today after earlier radioactive seed localization. EXAM: SPECIMEN RADIOGRAPH OF THE LEFT BREAST COMPARISON:  Previous exam(s). FINDINGS: Status post excision of the left breast. The radioactive seed and biopsy marker clip are present, completely intact, and were marked for pathology. The positions of the radioactive seed and biopsy marker clip within the specimen were discussed with the OR staff during the procedure. IMPRESSION: Specimen radiograph of the left breast. Electronically Signed   By: Franki Cabot M.D.   On: 05/30/2017 13:51   Mm Lt Radioactive Seed Loc Mammo Guide  Result Date: 05/29/2017 CLINICAL DATA:  Preoperative radioactive seed localization prior to left breast lumpectomy. EXAM: MAMMOGRAPHIC GUIDED RADIOACTIVE SEED LOCALIZATION OF THE LEFT BREAST COMPARISON:  Previous exam(s). FINDINGS: Patient presents for radioactive seed localization prior to left breast lumpectomy. I met with the patient and we discussed the procedure of seed localization including benefits and alternatives. We discussed the high likelihood of a successful procedure. We discussed the risks of the procedure including infection, bleeding, tissue injury and further surgery. We discussed the low dose of radioactivity involved in the procedure. Informed, written consent was given. The usual time-out protocol was performed immediately prior to the procedure. Using mammographic guidance, sterile technique, 1% lidocaine and an I-125 radioactive seed, ribbon shaped tissue marker was localized using a medial approach. The follow-up mammogram images confirm the seed in the expected location and were marked for Dr. Excell Seltzer. Follow-up survey of the patient  confirms presence of the radioactive seed. Order number of I-125 seed:  [id number]. Total activity:  4.010 millicurie  Reference Date: May 11, 2017 The patient tolerated the procedure well and was released from the [place]. She was given instructions regarding seed removal. IMPRESSION: Radioactive seed localization left breast. No apparent complications. Electronically Signed   By: Fidela Salisbury M.D.   On: 05/29/2017 14:18   IMPRESSION/PLAN: 1. Stage IA, pT1b, pN0, grade 1 ER/PR positive invasive ductal carcinoma with low grade DCIS of the left breast. Dr. Lisbeth Renshaw discusses the pathology findings and reviews the  nature of early stage breast disease. The patient is counseled on the role of adjuvant radiotherapy, and we considered the grade of her tumor, the size, margin status, and her age in the discussion. We discussed the risks, benefits, short, and long term effects of radiotherapy, and the patient is not interested in proceeding. At the end of the conversation, she will follow up with Dr. Lindi Adie and has decided against adjuvant radiotherapy.   In a visit lasting 80 minutes, greater than 50% of the time was spent face to face discussing the rationale to consider radiation, and coordinating the patient's care.  The above documentation reflects my direct findings during this shared patient visit. Please see the separate note by Dr. Lisbeth Renshaw on this date for the remainder of the patient's follow up care.    Carola Rhine, PAC  This document serves as a record of services personally performed by the care team consisting of Kyung Rudd, MD and Carola Rhine, PA-C. It was created on their behalf by Reola Mosher, a trained medical scribe. The creation of this record is based on the scribe's personal observations and the provider's statements to them. This document has been checked and approved by the attending provider.

## 2017-06-22 DIAGNOSIS — Z23 Encounter for immunization: Secondary | ICD-10-CM | POA: Diagnosis not present

## 2017-06-23 DIAGNOSIS — Z471 Aftercare following joint replacement surgery: Secondary | ICD-10-CM | POA: Diagnosis not present

## 2017-06-23 DIAGNOSIS — Z96642 Presence of left artificial hip joint: Secondary | ICD-10-CM | POA: Diagnosis not present

## 2017-07-04 DIAGNOSIS — M19022 Primary osteoarthritis, left elbow: Secondary | ICD-10-CM | POA: Diagnosis not present

## 2017-07-13 DIAGNOSIS — I10 Essential (primary) hypertension: Secondary | ICD-10-CM | POA: Diagnosis not present

## 2017-07-13 DIAGNOSIS — I480 Paroxysmal atrial fibrillation: Secondary | ICD-10-CM | POA: Diagnosis not present

## 2017-07-13 DIAGNOSIS — G4733 Obstructive sleep apnea (adult) (pediatric): Secondary | ICD-10-CM | POA: Diagnosis not present

## 2017-07-14 ENCOUNTER — Telehealth: Payer: Self-pay

## 2017-07-14 NOTE — Telephone Encounter (Signed)
Returned pt call regarding concerns with starting letrozole therapy. She is having surgery on there wrist January 16th. Informed her I would leave her concerns with Dr. Lindi Adie. He will be back in the office after Christmas, December 26th. I let her know I would call next week and let her know if it is ok with Dr. Lindi Adie with her not starting letrozole.  Cyndia Bent RN

## 2017-07-21 ENCOUNTER — Telehealth: Payer: Self-pay

## 2017-07-21 NOTE — Telephone Encounter (Signed)
Called pt and informed her it is ok per Dr. Lindi Adie that she does not start the letrozole prior to surgery on January 16th on her wrist. She has scheduled an appt on February 1st with Dr. Lindi Adie to discuss her letrozole concerns and other options. No further questions at this time.  Cyndia Bent RN

## 2017-08-09 ENCOUNTER — Other Ambulatory Visit: Payer: Self-pay

## 2017-08-09 DIAGNOSIS — M898X3 Other specified disorders of bone, forearm: Secondary | ICD-10-CM | POA: Diagnosis not present

## 2017-08-09 DIAGNOSIS — M064 Inflammatory polyarthropathy: Secondary | ICD-10-CM | POA: Diagnosis not present

## 2017-08-09 DIAGNOSIS — M24522 Contracture, left elbow: Secondary | ICD-10-CM | POA: Diagnosis not present

## 2017-08-09 DIAGNOSIS — M19022 Primary osteoarthritis, left elbow: Secondary | ICD-10-CM | POA: Diagnosis not present

## 2017-08-09 DIAGNOSIS — M659 Synovitis and tenosynovitis, unspecified: Secondary | ICD-10-CM | POA: Diagnosis not present

## 2017-08-11 DIAGNOSIS — M659 Synovitis and tenosynovitis, unspecified: Secondary | ICD-10-CM | POA: Insufficient documentation

## 2017-08-22 DIAGNOSIS — M19022 Primary osteoarthritis, left elbow: Secondary | ICD-10-CM | POA: Diagnosis not present

## 2017-08-22 DIAGNOSIS — M25522 Pain in left elbow: Secondary | ICD-10-CM | POA: Diagnosis not present

## 2017-08-22 DIAGNOSIS — Z4889 Encounter for other specified surgical aftercare: Secondary | ICD-10-CM | POA: Diagnosis not present

## 2017-08-22 DIAGNOSIS — M19029 Primary osteoarthritis, unspecified elbow: Secondary | ICD-10-CM | POA: Diagnosis not present

## 2017-08-24 NOTE — Assessment & Plan Note (Signed)
05/30/2017: Left lumpectomy: IDC grade 1, 0.8 cm, with low-grade DCIS, ER 100%, PR 50%, Ki-67 5%, HER-2 negative ratio 1.5, T1BN0 stage I a  Did not receive adj XRT Current Treatment: Letrozole 2.5 mg daily started Jan 2019  Letrozole Toxicities:  RTC in 1 year

## 2017-08-25 ENCOUNTER — Telehealth: Payer: Self-pay | Admitting: Hematology and Oncology

## 2017-08-25 ENCOUNTER — Inpatient Hospital Stay: Payer: PPO | Attending: Hematology and Oncology | Admitting: Hematology and Oncology

## 2017-08-25 DIAGNOSIS — Z170 Estrogen receptor positive status [ER+]: Secondary | ICD-10-CM | POA: Insufficient documentation

## 2017-08-25 DIAGNOSIS — M7062 Trochanteric bursitis, left hip: Secondary | ICD-10-CM | POA: Insufficient documentation

## 2017-08-25 DIAGNOSIS — C50212 Malignant neoplasm of upper-inner quadrant of left female breast: Secondary | ICD-10-CM | POA: Insufficient documentation

## 2017-08-25 DIAGNOSIS — M25552 Pain in left hip: Secondary | ICD-10-CM

## 2017-08-25 DIAGNOSIS — Z96642 Presence of left artificial hip joint: Secondary | ICD-10-CM

## 2017-08-25 DIAGNOSIS — G8929 Other chronic pain: Secondary | ICD-10-CM | POA: Insufficient documentation

## 2017-08-25 NOTE — Telephone Encounter (Signed)
Gave patient AVs and calendar of upcoming November appointments.  °

## 2017-08-25 NOTE — Progress Notes (Signed)
Patient Care Team: [name], MD as PCP - General (Internal Medicine)  DIAGNOSIS:  Encounter Diagnosis  Name Primary?  . Malignant neoplasm of upper-inner quadrant of left breast in female, estrogen receptor positive ([place])     SUMMARY OF ONCOLOGIC HISTORY:   Malignant neoplasm of upper-inner quadrant of left breast in female, estrogen receptor positive ([place])   05/15/2017 Initial Diagnosis    Malignant neoplasm of upper-inner quadrant of left breast in female, estrogen receptor positive ([place]): Microscopic focus suspicious for IDC low-grade      05/30/2017 Surgery    Left lumpectomy: IDC grade 1, 0.8 cm, with low-grade DCIS, ER 100%, PR 50%, Ki-67 5%, HER-2 negative ratio 1.5, T1BN0 stage I a       Miscellaneous    Refused radiation and Anti-estrogen therapy       CHIEF COMPLIANT: Follow-up to discuss treatment plan  INTERVAL HISTORY: Heidi Adams is a 82 year old with above-mentioned left breast cancer treated with lumpectomy and refused adjuvant radiation therapy.  She is here today to discuss the role of adjuvant antiestrogen therapy.  She does not want to take any pills because she thinks she is very sensitive to medications and the also that she has multiple orthopedic issues and she worries that antiestrogen therapy would increase some of the symptoms.  She is very concerned about quality of life issues as well as increased sensitivity to medications in general.  She is here today accompanied by her husband.  REVIEW OF SYSTEMS:   Constitutional: Denies fevers, chills or abnormal weight loss Eyes: Denies blurriness of vision Ears, nose, mouth, throat, and face: Denies mucositis or sore throat Respiratory: Denies cough, dyspnea or wheezes Cardiovascular: Denies palpitation, chest discomfort Gastrointestinal:  Denies nausea, heartburn or change in bowel habits Skin: Denies abnormal skin rashes Lymphatics: Denies new lymphadenopathy or easy bruising Neurological:Denies  numbness, tingling or new weaknesses Behavioral/Psych: Mood is stable, no new changes  Extremities: Patient is wearing a splint on the left forearm.  She is plans to undergo surgery in the right thumb Breast: Prior left lumpectomy All other systems were reviewed with the patient and are negative.  I have reviewed the past medical history, past surgical history, social history and family history with the patient and they are unchanged from previous note.  ALLERGIES:  is allergic to codeine; epinephrine; and tenormin [atenolol].  MEDICATIONS:  Current Outpatient Medications  Medication Sig Dispense Refill  . acetaminophen (TYLENOL) 500 MG tablet Take 1,000 mg once by mouth.    Marland Kitchen aspirin EC 81 MG tablet Take 81 mg by mouth daily.    . celecoxib (CELEBREX) 200 MG capsule Take 200 mg by mouth daily.    . cholecalciferol (VITAMIN D) 1000 units tablet Take 1,000 Units by mouth daily. 2077m    . hydrochlorothiazide (HYDRODIURIL) 25 MG tablet Take 12.5 mg by mouth daily.    .Marland Kitchenlevothyroxine (SYNTHROID, LEVOTHROID) 112 MCG tablet Take 112 mcg by mouth daily before breakfast.    . nadolol (CORGARD) 20 MG tablet Take 20 mg by mouth daily.    . pantoprazole (PROTONIX) 40 MG tablet Take 40 mg by mouth daily as needed (indigestion).     .Marland Kitchensenna (SENOKOT) 8.6 MG tablet Take 34.4 tablets by mouth daily as needed for constipation.      No current facility-administered medications for this visit.     PHYSICAL EXAMINATION: ECOG PERFORMANCE STATUS: 2 - Symptomatic, <50% confined to bed  Vitals:   08/25/17 1035  BP: 136/63  Pulse:  92  Resp: 18  Temp: 98.2 F (36.8 C)  SpO2: 100%   Filed Weights   08/25/17 1035  Weight: 144 lb 6.4 oz (65.5 kg)    GENERAL:alert, no distress and comfortable SKIN: skin color, texture, turgor are normal, no rashes or significant lesions EYES: normal, Conjunctiva are pink and non-injected, sclera clear OROPHARYNX:no exudate, no erythema and lips, buccal mucosa, and  tongue normal  NECK: supple, thyroid normal size, non-tender, without nodularity LYMPH:  no palpable lymphadenopathy in the cervical, axillary or inguinal LUNGS: clear to auscultation and percussion with normal breathing effort HEART: regular rate & rhythm and no murmurs and no lower extremity edema ABDOMEN:abdomen soft, non-tender and normal bowel sounds MUSCULOSKELETAL:no cyanosis of digits and no clubbing  NEURO: alert & oriented x 3 with fluent speech, no focal motor/sensory deficits EXTREMITIES: No lower extremity edema  LABORATORY DATA:  I have reviewed the data as listed CMP Latest Ref Rng & Units 05/26/2017 02/03/2017 01/11/2017  Glucose 65 - 99 mg/dL 89 89 107(H)  BUN 6 - 20 mg/dL 24(H) 15 16  Creatinine 0.44 - 1.00 mg/dL 0.82 0.56 0.50  Sodium 135 - 145 mmol/L 139 139 139  Potassium 3.5 - 5.1 mmol/L 4.2 3.4(L) 3.7  Chloride 101 - 111 mmol/L 106 104 104  CO2 22 - 32 mmol/L 26 27 26   Calcium 8.9 - 10.3 mg/dL 9.5 8.9 8.8(L)    Lab Results  Component Value Date   WBC 7.7 02/03/2017   HGB 10.2 (L) 02/03/2017   HCT 33.1 (L) 02/03/2017   MCV 89.5 02/03/2017   PLT 239 02/03/2017    ASSESSMENT & PLAN:  Malignant neoplasm of upper-inner quadrant of left breast in female, estrogen receptor positive ([place]) 05/30/2017: Left lumpectomy: IDC grade 1, 0.8 cm, with low-grade DCIS, ER 100%, PR 50%, Ki-67 5%, HER-2 negative ratio 1.5, T1BN0 stage I a  Did not receive adj XRT Today she is here to discuss the pros and cons of antiestrogen therapy. She understands risks and she does not want to take antiestrogen therapy.  She understands that her risk of recurrence could be as high as 15%. She wants to continue with the surveillance checkups and follow-ups with [place]. She will be getting mammograms in May and follow-up with me in November.  Current Treatment: Observation  RTC in 1 year   I spent 25 minutes talking to the patient of which more than half was spent in counseling and  coordination of care.  Orders Placed This Encounter  Procedures  . MM DIAG BREAST TOMO BILATERAL    Standing Status:   Future    Standing Expiration Date:   08/25/2018    Order Specific Question:   Reason for Exam (SYMPTOM  OR DIAGNOSIS REQUIRED)    Answer:   Breast cancer annual follow up    Order Specific Question:   Preferred imaging location?    Answer:   [facility]   The patient has a good understanding of the overall plan. she agrees with it. she will call with any problems that may develop before the next visit here.   Harriette Ohara, MD 08/25/17

## 2017-08-28 ENCOUNTER — Telehealth: Payer: Self-pay

## 2017-08-28 DIAGNOSIS — M7062 Trochanteric bursitis, left hip: Secondary | ICD-10-CM | POA: Diagnosis not present

## 2017-08-28 DIAGNOSIS — Z96642 Presence of left artificial hip joint: Secondary | ICD-10-CM | POA: Diagnosis not present

## 2017-08-28 NOTE — Telephone Encounter (Signed)
Returned pt's call regarding 'prescription for fitted bra at [facility].' Order faxed.  Notified patient the order was sent.  No other needs at this time per pt.

## 2017-09-04 DIAGNOSIS — M25552 Pain in left hip: Secondary | ICD-10-CM | POA: Diagnosis not present

## 2017-09-04 DIAGNOSIS — R2689 Other abnormalities of gait and mobility: Secondary | ICD-10-CM | POA: Diagnosis not present

## 2017-09-04 DIAGNOSIS — M62552 Muscle wasting and atrophy, not elsewhere classified, left thigh: Secondary | ICD-10-CM | POA: Diagnosis not present

## 2017-09-06 DIAGNOSIS — M62552 Muscle wasting and atrophy, not elsewhere classified, left thigh: Secondary | ICD-10-CM | POA: Diagnosis not present

## 2017-09-06 DIAGNOSIS — M25552 Pain in left hip: Secondary | ICD-10-CM | POA: Diagnosis not present

## 2017-09-06 DIAGNOSIS — R2689 Other abnormalities of gait and mobility: Secondary | ICD-10-CM | POA: Diagnosis not present

## 2017-09-08 DIAGNOSIS — R2689 Other abnormalities of gait and mobility: Secondary | ICD-10-CM | POA: Diagnosis not present

## 2017-09-08 DIAGNOSIS — M25552 Pain in left hip: Secondary | ICD-10-CM | POA: Diagnosis not present

## 2017-09-08 DIAGNOSIS — M62552 Muscle wasting and atrophy, not elsewhere classified, left thigh: Secondary | ICD-10-CM | POA: Diagnosis not present

## 2017-09-11 DIAGNOSIS — M25552 Pain in left hip: Secondary | ICD-10-CM | POA: Diagnosis not present

## 2017-09-11 DIAGNOSIS — R2689 Other abnormalities of gait and mobility: Secondary | ICD-10-CM | POA: Diagnosis not present

## 2017-09-11 DIAGNOSIS — M62552 Muscle wasting and atrophy, not elsewhere classified, left thigh: Secondary | ICD-10-CM | POA: Diagnosis not present

## 2017-09-12 ENCOUNTER — Encounter: Payer: Self-pay | Admitting: Gynecology

## 2017-09-12 ENCOUNTER — Ambulatory Visit: Payer: PPO | Admitting: Gynecology

## 2017-09-12 VITALS — BP 120/76

## 2017-09-12 DIAGNOSIS — B373 Candidiasis of vulva and vagina: Secondary | ICD-10-CM | POA: Diagnosis not present

## 2017-09-12 DIAGNOSIS — Z4889 Encounter for other specified surgical aftercare: Secondary | ICD-10-CM | POA: Diagnosis not present

## 2017-09-12 DIAGNOSIS — N898 Other specified noninflammatory disorders of vagina: Secondary | ICD-10-CM | POA: Diagnosis not present

## 2017-09-12 DIAGNOSIS — B3731 Acute candidiasis of vulva and vagina: Secondary | ICD-10-CM

## 2017-09-12 DIAGNOSIS — M19029 Primary osteoarthritis, unspecified elbow: Secondary | ICD-10-CM | POA: Diagnosis not present

## 2017-09-12 LAB — WET PREP FOR TRICH, YEAST, CLUE

## 2017-09-12 MED ORDER — TERCONAZOLE 0.4 % VA CREA: 1.0000 | TOPICAL_CREAM | Freq: Every day | VAGINAL | 0 refills | Status: DC

## 2017-09-12 NOTE — Progress Notes (Signed)
    Heidi Adams November 01, 1935 [id number]        82 y.o.  G2P2 presents with vaginal irritation of the last several weeks.  Left side worse than right.  Has been putting triamcinolone cream and notes that this seems to help.  Has not been seen in several years.  Had recent hip replacement surgery.  No significant discharge or odor.  No urinary symptoms such as frequency dysuria urgency low back pain fever or chills.  Past medical history,surgical history, problem list, medications, allergies, family history and social history were all reviewed and documented in the EPIC chart.  Directed ROS with pertinent positives and negatives documented in the history of present illness/assessment and plan.  Exam: Caryn Bee assistant Vitals:   09/12/17 0853  BP: 120/76   General appearance:  Normal Abdomen soft nontender without masses guarding rebound Pelvic external BUS vagina with atrophic changes.  Erythematous irritative rash like area mid to lower left labia majora.  Some desquamation from inflammatory changes.  No true ulcerations.  Vagina with atrophic changes but otherwise normal.  Bimanual exam without masses or tenderness.  Assessment/Plan:  82 y.o. G2P2 history and exam is observed.  Wet prep returns positive for yeast.  I suspect with her surgery and probable antibiotics and her immobility this is all yeast vulvitis.  I did review with her the issues of shingles but given the history we will go with the obvious.  Terazol 7 day cream internal/external.  Continue with the triamcinolone cream that she has also daily.  Follow-up if her symptoms persist, worsen or recur.    Anastasio Auerbach MD, 9:07 AM 09/12/2017

## 2017-09-12 NOTE — Patient Instructions (Signed)
Use the Terazol vaginal cream on the outside and inside of the vagina with the applicator nightly.  Continue applying the triamcinolone steroid cream externally during the day.  Follow-up if the symptoms persist, worsen or recur.

## 2017-09-12 NOTE — Addendum Note (Signed)
Addended by: Nelva Nay on: 09/12/2017 09:31 AM   Modules accepted: Orders

## 2017-09-13 DIAGNOSIS — M62552 Muscle wasting and atrophy, not elsewhere classified, left thigh: Secondary | ICD-10-CM | POA: Diagnosis not present

## 2017-09-13 DIAGNOSIS — R2689 Other abnormalities of gait and mobility: Secondary | ICD-10-CM | POA: Diagnosis not present

## 2017-09-13 DIAGNOSIS — M25552 Pain in left hip: Secondary | ICD-10-CM | POA: Diagnosis not present

## 2017-09-14 ENCOUNTER — Telehealth: Payer: Self-pay | Admitting: *Deleted

## 2017-09-14 MED ORDER — NONFORMULARY OR COMPOUNDED ITEM: 0 refills | Status: DC

## 2017-09-14 MED ORDER — FLUCONAZOLE 150 MG PO TABS: ORAL_TABLET | ORAL | 0 refills | Status: DC

## 2017-09-14 NOTE — Telephone Encounter (Signed)
Pt informed, Rx sent. 

## 2017-09-14 NOTE — Telephone Encounter (Signed)
Dr.Fontaine asked if you could refill triamcinolone cream 0.1cream /SSD Silvaden 1% cream pt said she said it was prescribed by a dermatologist years ago and it helps at the once spot externally. I checked with the pharmacy and it is a compound medication. Please advise

## 2017-09-14 NOTE — Telephone Encounter (Signed)
Pt was seen on 09/12/17 treated for yeast with Terazol 7 day cream used for 2 days and had to stop states she had terrible vaginal burning, pt said it felt like "fire". She asked if another Rx can be prescribed? Please advise

## 2017-09-14 NOTE — Telephone Encounter (Signed)
Can go with Diflucan 150 mg daily times 5 days.  If she is on cholesterol medicine she needs to stop it during this time.  Continue with the triamcinolone cream twice daily

## 2017-09-14 NOTE — Telephone Encounter (Signed)
Okay to refill? 

## 2017-09-14 NOTE — Telephone Encounter (Signed)
rx called in

## 2017-09-14 NOTE — Addendum Note (Signed)
Addended by: Thamas Jaegers on: 09/14/2017 12:41 PM   Modules accepted: Orders

## 2017-09-15 DIAGNOSIS — R2689 Other abnormalities of gait and mobility: Secondary | ICD-10-CM | POA: Diagnosis not present

## 2017-09-15 DIAGNOSIS — M25552 Pain in left hip: Secondary | ICD-10-CM | POA: Diagnosis not present

## 2017-09-15 DIAGNOSIS — M62552 Muscle wasting and atrophy, not elsewhere classified, left thigh: Secondary | ICD-10-CM | POA: Diagnosis not present

## 2017-09-18 DIAGNOSIS — R2689 Other abnormalities of gait and mobility: Secondary | ICD-10-CM | POA: Diagnosis not present

## 2017-09-18 DIAGNOSIS — M62552 Muscle wasting and atrophy, not elsewhere classified, left thigh: Secondary | ICD-10-CM | POA: Diagnosis not present

## 2017-09-18 DIAGNOSIS — M25552 Pain in left hip: Secondary | ICD-10-CM | POA: Diagnosis not present

## 2017-09-21 DIAGNOSIS — M62552 Muscle wasting and atrophy, not elsewhere classified, left thigh: Secondary | ICD-10-CM | POA: Diagnosis not present

## 2017-09-21 DIAGNOSIS — M25552 Pain in left hip: Secondary | ICD-10-CM | POA: Diagnosis not present

## 2017-09-21 DIAGNOSIS — R2689 Other abnormalities of gait and mobility: Secondary | ICD-10-CM | POA: Diagnosis not present

## 2017-09-22 DIAGNOSIS — D0472 Carcinoma in situ of skin of left lower limb, including hip: Secondary | ICD-10-CM | POA: Diagnosis not present

## 2017-09-22 DIAGNOSIS — L853 Xerosis cutis: Secondary | ICD-10-CM | POA: Diagnosis not present

## 2017-09-22 DIAGNOSIS — Z85828 Personal history of other malignant neoplasm of skin: Secondary | ICD-10-CM | POA: Diagnosis not present

## 2017-09-22 DIAGNOSIS — L810 Postinflammatory hyperpigmentation: Secondary | ICD-10-CM | POA: Diagnosis not present

## 2017-09-22 DIAGNOSIS — H61001 Unspecified perichondritis of right external ear: Secondary | ICD-10-CM | POA: Diagnosis not present

## 2017-09-22 DIAGNOSIS — D485 Neoplasm of uncertain behavior of skin: Secondary | ICD-10-CM | POA: Diagnosis not present

## 2017-09-25 DIAGNOSIS — H9203 Otalgia, bilateral: Secondary | ICD-10-CM | POA: Diagnosis not present

## 2017-09-25 DIAGNOSIS — R5383 Other fatigue: Secondary | ICD-10-CM | POA: Diagnosis not present

## 2017-09-26 DIAGNOSIS — Z6826 Body mass index (BMI) 26.0-26.9, adult: Secondary | ICD-10-CM | POA: Diagnosis not present

## 2017-09-26 DIAGNOSIS — J329 Chronic sinusitis, unspecified: Secondary | ICD-10-CM | POA: Diagnosis not present

## 2017-09-26 DIAGNOSIS — H9203 Otalgia, bilateral: Secondary | ICD-10-CM | POA: Diagnosis not present

## 2017-09-26 DIAGNOSIS — H6593 Unspecified nonsuppurative otitis media, bilateral: Secondary | ICD-10-CM | POA: Diagnosis not present

## 2017-10-09 DIAGNOSIS — E7849 Other hyperlipidemia: Secondary | ICD-10-CM | POA: Diagnosis not present

## 2017-10-09 DIAGNOSIS — R82998 Other abnormal findings in urine: Secondary | ICD-10-CM | POA: Diagnosis not present

## 2017-10-09 DIAGNOSIS — E038 Other specified hypothyroidism: Secondary | ICD-10-CM | POA: Diagnosis not present

## 2017-10-09 DIAGNOSIS — I10 Essential (primary) hypertension: Secondary | ICD-10-CM | POA: Diagnosis not present

## 2017-10-10 DIAGNOSIS — M19022 Primary osteoarthritis, left elbow: Secondary | ICD-10-CM | POA: Diagnosis not present

## 2017-10-10 DIAGNOSIS — Z4789 Encounter for other orthopedic aftercare: Secondary | ICD-10-CM | POA: Diagnosis not present

## 2017-10-11 DIAGNOSIS — Z96642 Presence of left artificial hip joint: Secondary | ICD-10-CM | POA: Diagnosis not present

## 2017-10-11 DIAGNOSIS — M7062 Trochanteric bursitis, left hip: Secondary | ICD-10-CM | POA: Diagnosis not present

## 2017-10-16 DIAGNOSIS — R0600 Dyspnea, unspecified: Secondary | ICD-10-CM | POA: Diagnosis not present

## 2017-10-16 DIAGNOSIS — H7390 Unspecified disorder of tympanic membrane, unspecified ear: Secondary | ICD-10-CM | POA: Diagnosis not present

## 2017-10-16 DIAGNOSIS — Z0000 Encounter for general adult medical examination without abnormal findings: Secondary | ICD-10-CM | POA: Diagnosis not present

## 2017-10-16 DIAGNOSIS — I951 Orthostatic hypotension: Secondary | ICD-10-CM | POA: Diagnosis not present

## 2017-10-16 DIAGNOSIS — D649 Anemia, unspecified: Secondary | ICD-10-CM | POA: Diagnosis not present

## 2017-10-16 DIAGNOSIS — I10 Essential (primary) hypertension: Secondary | ICD-10-CM | POA: Diagnosis not present

## 2017-10-16 DIAGNOSIS — R634 Abnormal weight loss: Secondary | ICD-10-CM | POA: Diagnosis not present

## 2017-10-16 DIAGNOSIS — I480 Paroxysmal atrial fibrillation: Secondary | ICD-10-CM | POA: Diagnosis not present

## 2017-10-16 DIAGNOSIS — C50919 Malignant neoplasm of unspecified site of unspecified female breast: Secondary | ICD-10-CM | POA: Diagnosis not present

## 2017-10-16 DIAGNOSIS — Z1389 Encounter for screening for other disorder: Secondary | ICD-10-CM | POA: Diagnosis not present

## 2017-10-16 DIAGNOSIS — Z6826 Body mass index (BMI) 26.0-26.9, adult: Secondary | ICD-10-CM | POA: Diagnosis not present

## 2017-10-16 DIAGNOSIS — I878 Other specified disorders of veins: Secondary | ICD-10-CM | POA: Diagnosis not present

## 2017-10-19 DIAGNOSIS — J3489 Other specified disorders of nose and nasal sinuses: Secondary | ICD-10-CM | POA: Insufficient documentation

## 2017-10-19 DIAGNOSIS — H900 Conductive hearing loss, bilateral: Secondary | ICD-10-CM | POA: Diagnosis not present

## 2017-10-19 DIAGNOSIS — H6503 Acute serous otitis media, bilateral: Secondary | ICD-10-CM | POA: Diagnosis not present

## 2017-10-24 DIAGNOSIS — C50912 Malignant neoplasm of unspecified site of left female breast: Secondary | ICD-10-CM | POA: Diagnosis not present

## 2017-10-26 DIAGNOSIS — D4981 Neoplasm of unspecified behavior of retina and choroid: Secondary | ICD-10-CM | POA: Diagnosis not present

## 2017-10-26 DIAGNOSIS — H40013 Open angle with borderline findings, low risk, bilateral: Secondary | ICD-10-CM | POA: Diagnosis not present

## 2017-10-26 DIAGNOSIS — H04123 Dry eye syndrome of bilateral lacrimal glands: Secondary | ICD-10-CM | POA: Diagnosis not present

## 2017-10-26 DIAGNOSIS — H25813 Combined forms of age-related cataract, bilateral: Secondary | ICD-10-CM | POA: Diagnosis not present

## 2017-11-02 DIAGNOSIS — Z85828 Personal history of other malignant neoplasm of skin: Secondary | ICD-10-CM | POA: Diagnosis not present

## 2017-11-02 DIAGNOSIS — L821 Other seborrheic keratosis: Secondary | ICD-10-CM | POA: Diagnosis not present

## 2017-11-07 DIAGNOSIS — M1812 Unilateral primary osteoarthritis of first carpometacarpal joint, left hand: Secondary | ICD-10-CM | POA: Diagnosis not present

## 2017-11-07 DIAGNOSIS — Z4789 Encounter for other orthopedic aftercare: Secondary | ICD-10-CM | POA: Diagnosis not present

## 2017-11-07 DIAGNOSIS — M189 Osteoarthritis of first carpometacarpal joint, unspecified: Secondary | ICD-10-CM | POA: Diagnosis not present

## 2017-11-07 DIAGNOSIS — M13841 Other specified arthritis, right hand: Secondary | ICD-10-CM | POA: Diagnosis not present

## 2017-11-07 DIAGNOSIS — M19022 Primary osteoarthritis, left elbow: Secondary | ICD-10-CM | POA: Diagnosis not present

## 2017-11-08 DIAGNOSIS — M25562 Pain in left knee: Secondary | ICD-10-CM | POA: Insufficient documentation

## 2017-11-08 DIAGNOSIS — M7062 Trochanteric bursitis, left hip: Secondary | ICD-10-CM | POA: Diagnosis not present

## 2017-11-16 DIAGNOSIS — M25552 Pain in left hip: Secondary | ICD-10-CM | POA: Diagnosis not present

## 2017-11-21 ENCOUNTER — Ambulatory Visit: Payer: PPO | Admitting: Hematology and Oncology

## 2017-11-23 DIAGNOSIS — I10 Essential (primary) hypertension: Secondary | ICD-10-CM | POA: Diagnosis not present

## 2017-11-23 DIAGNOSIS — Z6826 Body mass index (BMI) 26.0-26.9, adult: Secondary | ICD-10-CM | POA: Diagnosis not present

## 2017-12-01 ENCOUNTER — Other Ambulatory Visit: Payer: Self-pay | Admitting: Orthopedic Surgery

## 2017-12-01 DIAGNOSIS — Z96642 Presence of left artificial hip joint: Secondary | ICD-10-CM

## 2017-12-04 DIAGNOSIS — M19041 Primary osteoarthritis, right hand: Secondary | ICD-10-CM | POA: Diagnosis not present

## 2017-12-08 ENCOUNTER — Ambulatory Visit
Admission: RE | Admit: 2017-12-08 | Discharge: 2017-12-08 | Disposition: A | Discharge status: Home / Self Care | Payer: PPO | Source: Ambulatory Visit | Attending: Orthopedic Surgery | Admitting: Orthopedic Surgery

## 2017-12-08 DIAGNOSIS — R269 Unspecified abnormalities of gait and mobility: Secondary | ICD-10-CM

## 2017-12-08 DIAGNOSIS — Z96642 Presence of left artificial hip joint: Secondary | ICD-10-CM

## 2017-12-08 DIAGNOSIS — M25552 Pain in left hip: Secondary | ICD-10-CM | POA: Diagnosis not present

## 2017-12-08 IMAGING — XA DG FLUORO GUIDE NDL PLC/BX
1 series · 1 of 1 positions shown · non-contrast
Comparison: none

CLINICAL DATA: Left hip pain status post total arthroplasty. Joint
effusion on MRI.

[Series 1: ortho adipose · 1 of 1 slices shown]
[im 1/1]
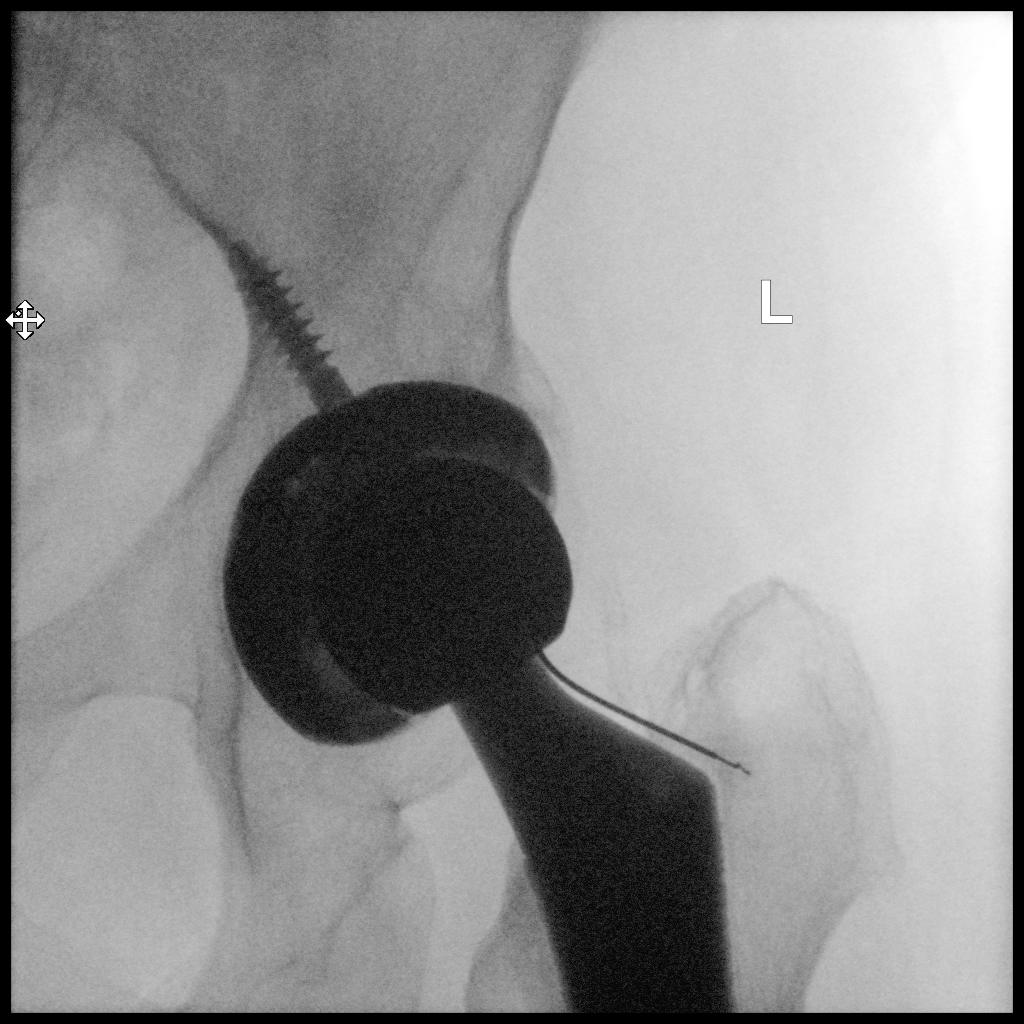

[1 of 1 positions shown; findings below may reference images not displayed]

EXAM:
LEFT HIP ASPIRATION UNDER FLUOROSCOPY

FLUOROSCOPY TIME:  Fluoroscopy Time:  2 seconds

Radiation Exposure Index (if provided by the fluoroscopic device):
11.64 microGray*m^2

Number of Acquired Spot Images: 0

PROCEDURE:
Overlying skin prepped with Betadine, draped in the usual sterile
fashion, and infiltrated locally with buffered Lidocaine. A 3.5 inch
20 gauge spinal needle was advanced to the posterolateral margin of
the head-neck junction of the left femoral prosthesis. Aspiration
yield 10 mL of clear, straw-colored fluid. This was sent to the
laboratory for the requested studies. No immediate complication.
IMPRESSION: Technically successful left hip aspiration under fluoroscopy.

## 2017-12-13 LAB — SYNOVIAL CELL COUNT + DIFF, W/ CRYSTALS
Basophils, %: 0 %
Eosinophils-Synovial: 0 % (ref 0–2)
LYMPHOCYTES-SYNOVIAL FLD: 41 % (ref 0–74)
Monocyte/Macrophage: 2 % (ref 0–69)
NEUTROPHIL, SYNOVIAL: 57 % — AB (ref 0–24)
SYNOVIOCYTES, %: 0 % (ref 0–15)
WBC, Synovial: 1637 cells/uL — ABNORMAL HIGH (ref <150)

## 2017-12-13 LAB — ANAEROBIC AND AEROBIC CULTURE
AER RESULT:: NO GROWTH
MICRO NUMBER: 90603241
MICRO NUMBER:: 90603242
SPECIMEN QUALITY:: ADEQUATE
SPECIMEN QUALITY:: ADEQUATE

## 2017-12-14 ENCOUNTER — Other Ambulatory Visit: Payer: PPO

## 2017-12-19 DIAGNOSIS — Z4789 Encounter for other orthopedic aftercare: Secondary | ICD-10-CM | POA: Diagnosis not present

## 2017-12-19 DIAGNOSIS — M13841 Other specified arthritis, right hand: Secondary | ICD-10-CM | POA: Diagnosis not present

## 2017-12-25 ENCOUNTER — Ambulatory Visit
Admission: RE | Admit: 2017-12-25 | Discharge: 2017-12-25 | Disposition: A | Discharge status: Home / Self Care | Payer: PPO | Source: Ambulatory Visit | Attending: Hematology and Oncology | Admitting: Hematology and Oncology

## 2017-12-25 DIAGNOSIS — C50212 Malignant neoplasm of upper-inner quadrant of left female breast: Secondary | ICD-10-CM

## 2017-12-25 DIAGNOSIS — R928 Other abnormal and inconclusive findings on diagnostic imaging of breast: Secondary | ICD-10-CM | POA: Diagnosis not present

## 2017-12-25 DIAGNOSIS — Z170 Estrogen receptor positive status [ER+]: Principal | ICD-10-CM

## 2017-12-25 IMAGING — MG DIGITAL DIAGNOSTIC BILATERAL MAMMOGRAM WITH TOMO AND CAD
6 of 9 series · 6 of 25 positions shown · non-contrast
Comparison: Previous exam(s).

CLINICAL DATA: Left lumpectomy.  Annual mammography.

EXAM:
DIGITAL DIAGNOSTIC BILATERAL MAMMOGRAM WITH CAD AND TOMO

[L CC]
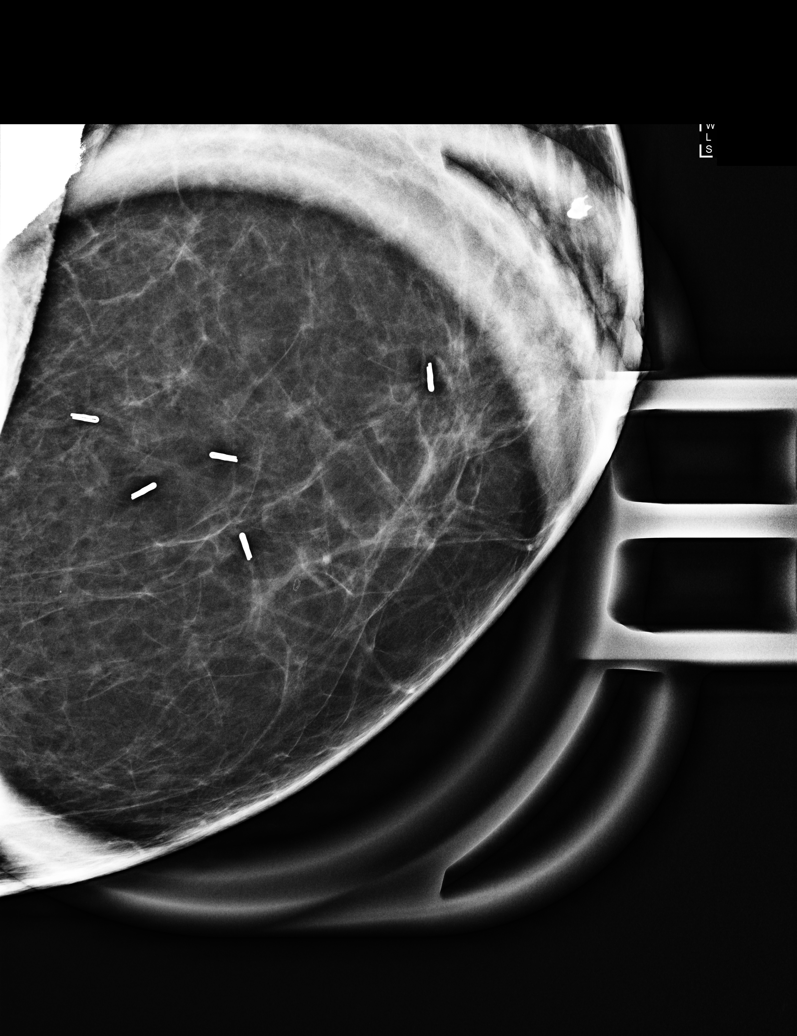

[R CC synth-2D]
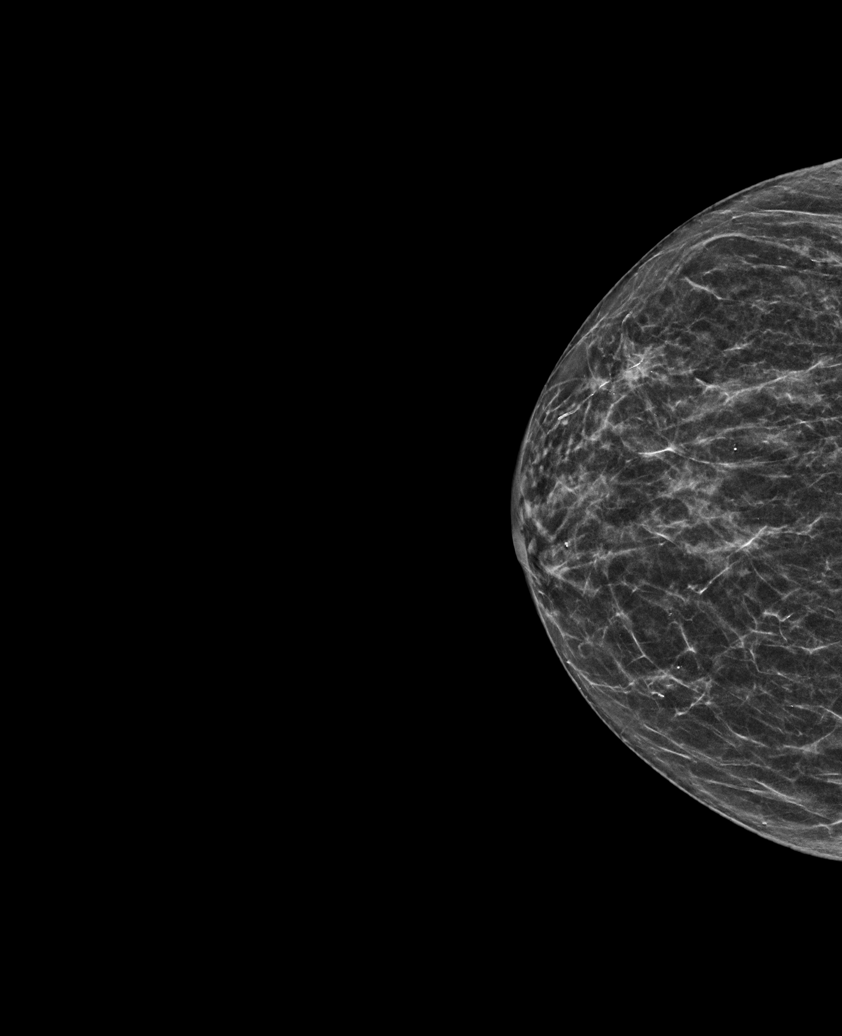

[L CC synth-2D]
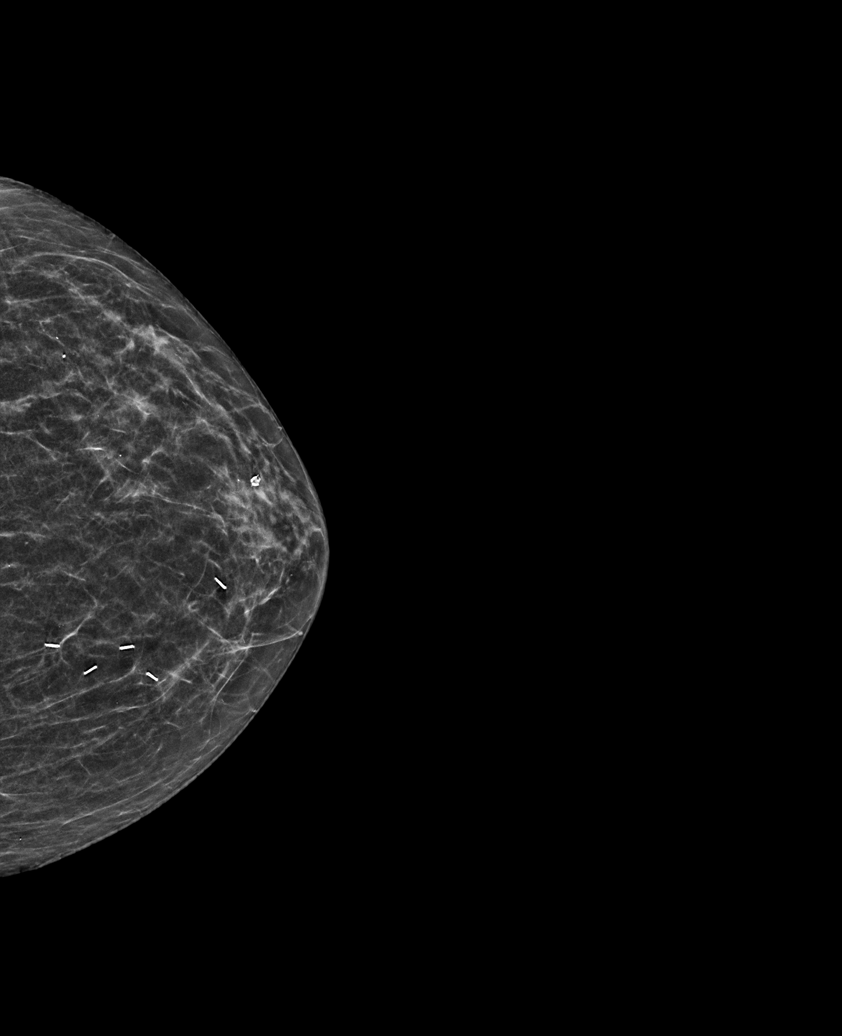

[L MLO synth-2D]
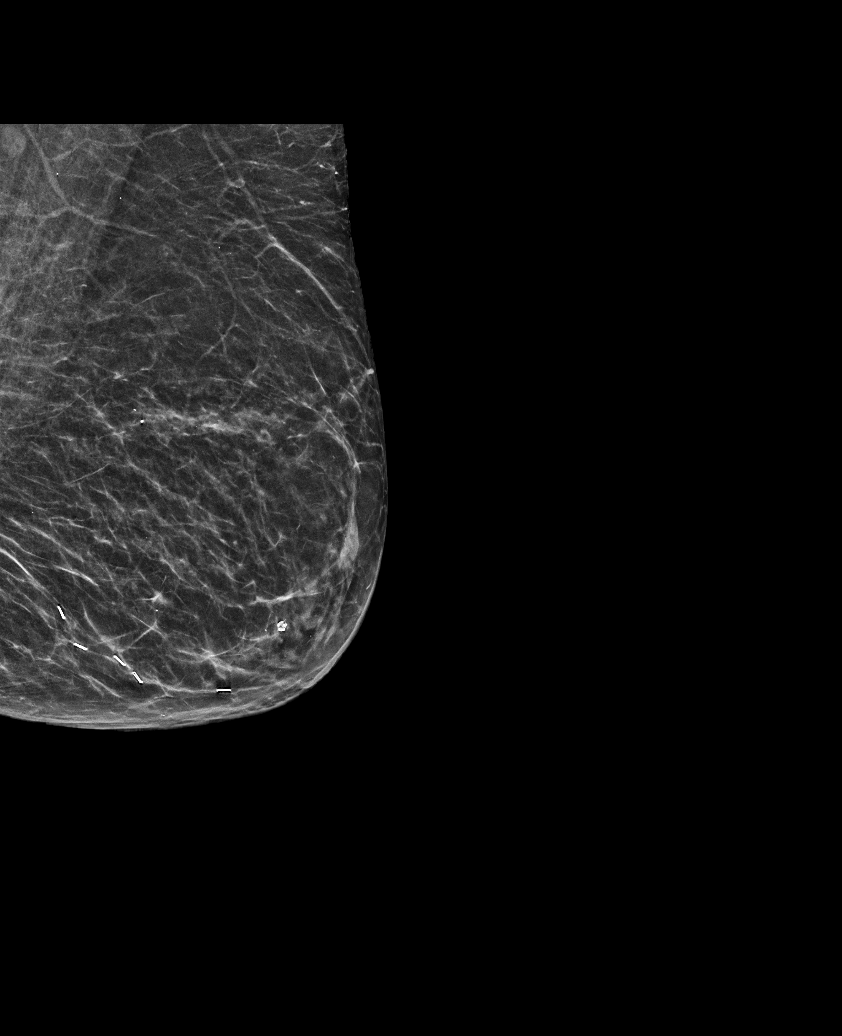

[R MLO synth-2D]
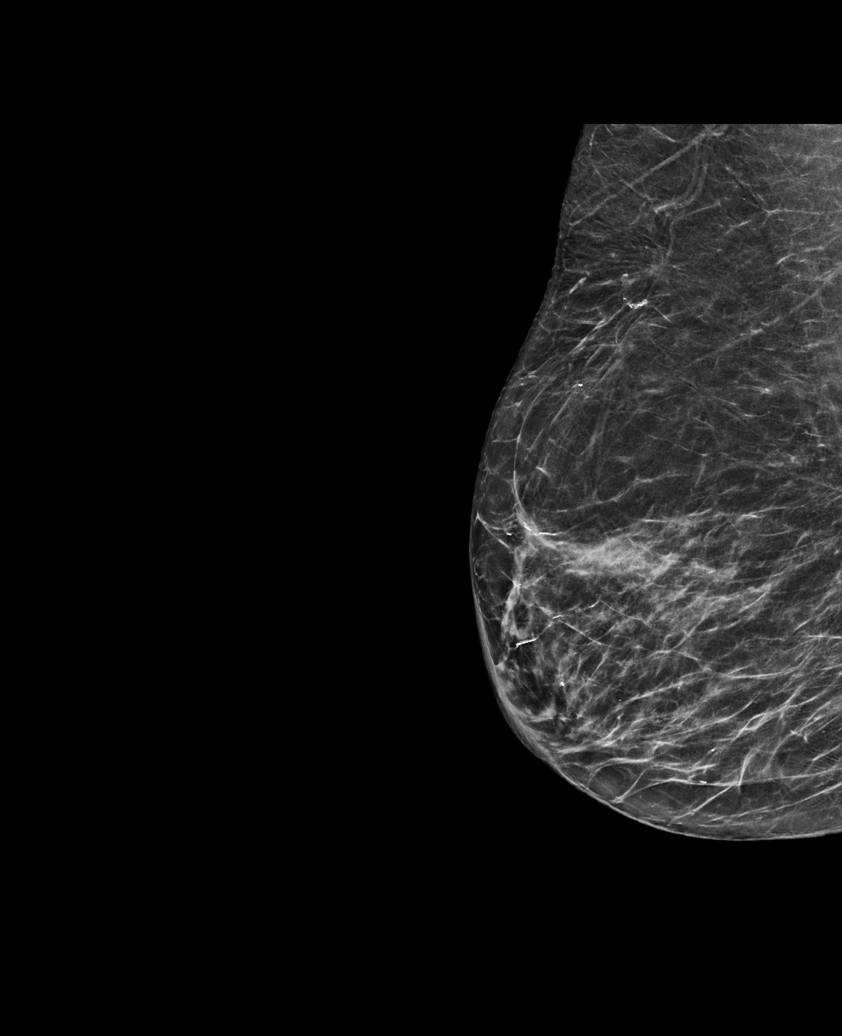

[L CC tomo · tomo slice 25/48.0]
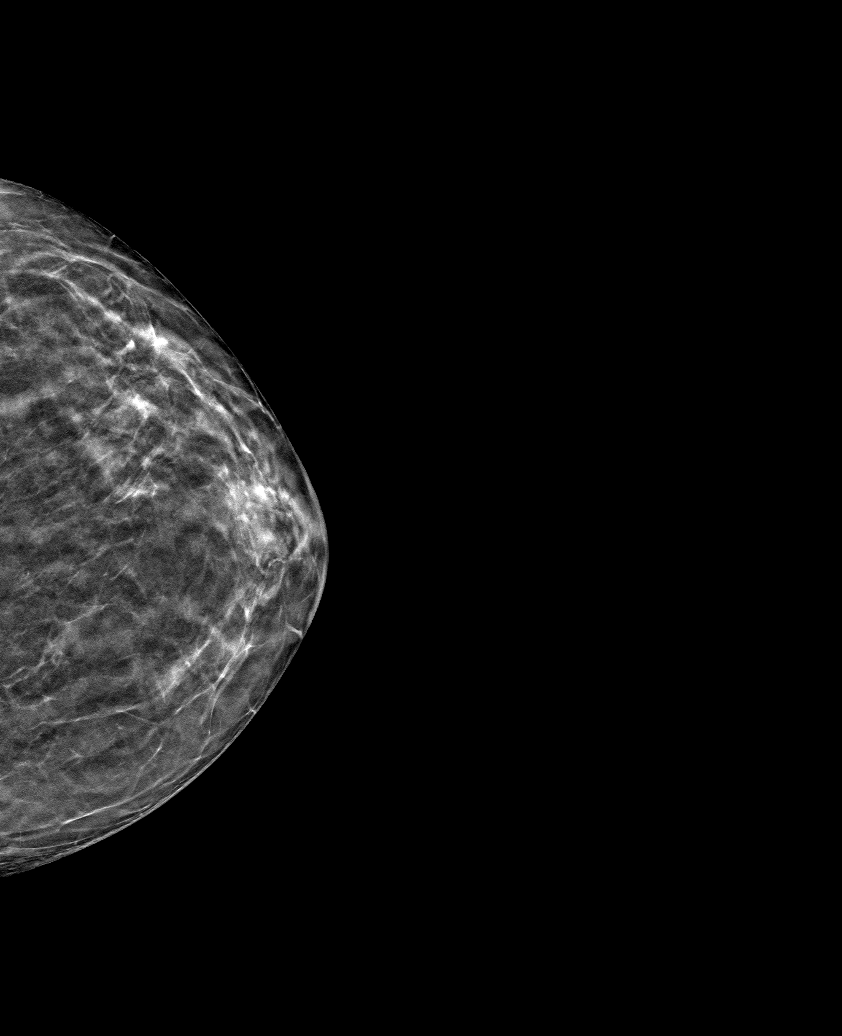

[6 of 25 positions shown; findings below may reference images not displayed]

ACR Breast Density Category b: There are scattered areas of
fibroglandular density.
FINDINGS: The left lumpectomy site appears as expected. No suspicious masses,
calcifications, or distortion.

Mammographic images were processed with CAD.
IMPRESSION: No mammographic evidence of malignancy.

RECOMMENDATION:
Annual diagnostic mammography.

I have discussed the findings and recommendations with the patient.
Results were also provided in writing at the conclusion of the
visit. If applicable, a reminder letter will be sent to the patient
regarding the next appointment.

BI-RADS CATEGORY  2: Benign.

## 2017-12-27 DIAGNOSIS — M25552 Pain in left hip: Secondary | ICD-10-CM | POA: Diagnosis not present

## 2017-12-27 DIAGNOSIS — Z96642 Presence of left artificial hip joint: Secondary | ICD-10-CM | POA: Diagnosis not present

## 2018-01-02 DIAGNOSIS — M13841 Other specified arthritis, right hand: Secondary | ICD-10-CM | POA: Diagnosis not present

## 2018-01-02 DIAGNOSIS — M79644 Pain in right finger(s): Secondary | ICD-10-CM | POA: Diagnosis not present

## 2018-01-02 DIAGNOSIS — Z4789 Encounter for other orthopedic aftercare: Secondary | ICD-10-CM | POA: Diagnosis not present

## 2018-01-04 DIAGNOSIS — H900 Conductive hearing loss, bilateral: Secondary | ICD-10-CM | POA: Diagnosis not present

## 2018-01-04 DIAGNOSIS — H90A31 Mixed conductive and sensorineural hearing loss, unilateral, right ear with restricted hearing on the contralateral side: Secondary | ICD-10-CM | POA: Diagnosis not present

## 2018-01-04 DIAGNOSIS — H6980 Other specified disorders of Eustachian tube, unspecified ear: Secondary | ICD-10-CM | POA: Diagnosis not present

## 2018-01-04 DIAGNOSIS — R42 Dizziness and giddiness: Secondary | ICD-10-CM | POA: Diagnosis not present

## 2018-01-10 DIAGNOSIS — M79644 Pain in right finger(s): Secondary | ICD-10-CM | POA: Diagnosis not present

## 2018-01-24 DIAGNOSIS — R42 Dizziness and giddiness: Secondary | ICD-10-CM | POA: Diagnosis not present

## 2018-01-24 DIAGNOSIS — H6980 Other specified disorders of Eustachian tube, unspecified ear: Secondary | ICD-10-CM | POA: Diagnosis not present

## 2018-01-30 DIAGNOSIS — Z4789 Encounter for other orthopedic aftercare: Secondary | ICD-10-CM | POA: Diagnosis not present

## 2018-01-30 DIAGNOSIS — M13841 Other specified arthritis, right hand: Secondary | ICD-10-CM | POA: Diagnosis not present

## 2018-02-07 DIAGNOSIS — M25552 Pain in left hip: Secondary | ICD-10-CM | POA: Diagnosis not present

## 2018-02-07 DIAGNOSIS — M7062 Trochanteric bursitis, left hip: Secondary | ICD-10-CM | POA: Diagnosis not present

## 2018-02-14 DIAGNOSIS — E7849 Other hyperlipidemia: Secondary | ICD-10-CM | POA: Diagnosis not present

## 2018-02-14 DIAGNOSIS — I480 Paroxysmal atrial fibrillation: Secondary | ICD-10-CM | POA: Diagnosis not present

## 2018-02-14 DIAGNOSIS — M1990 Unspecified osteoarthritis, unspecified site: Secondary | ICD-10-CM | POA: Diagnosis not present

## 2018-02-14 DIAGNOSIS — H7390 Unspecified disorder of tympanic membrane, unspecified ear: Secondary | ICD-10-CM | POA: Diagnosis not present

## 2018-02-14 DIAGNOSIS — I878 Other specified disorders of veins: Secondary | ICD-10-CM | POA: Diagnosis not present

## 2018-02-14 DIAGNOSIS — E038 Other specified hypothyroidism: Secondary | ICD-10-CM | POA: Diagnosis not present

## 2018-02-14 DIAGNOSIS — I951 Orthostatic hypotension: Secondary | ICD-10-CM | POA: Diagnosis not present

## 2018-02-14 DIAGNOSIS — I10 Essential (primary) hypertension: Secondary | ICD-10-CM | POA: Diagnosis not present

## 2018-02-14 DIAGNOSIS — Z6826 Body mass index (BMI) 26.0-26.9, adult: Secondary | ICD-10-CM | POA: Diagnosis not present

## 2018-02-14 DIAGNOSIS — C50919 Malignant neoplasm of unspecified site of unspecified female breast: Secondary | ICD-10-CM | POA: Diagnosis not present

## 2018-02-27 DIAGNOSIS — M542 Cervicalgia: Secondary | ICD-10-CM | POA: Diagnosis not present

## 2018-02-27 DIAGNOSIS — M5030 Other cervical disc degeneration, unspecified cervical region: Secondary | ICD-10-CM | POA: Diagnosis not present

## 2018-02-27 DIAGNOSIS — M4692 Unspecified inflammatory spondylopathy, cervical region: Secondary | ICD-10-CM | POA: Diagnosis not present

## 2018-03-07 DIAGNOSIS — Z6826 Body mass index (BMI) 26.0-26.9, adult: Secondary | ICD-10-CM | POA: Diagnosis not present

## 2018-03-07 DIAGNOSIS — I951 Orthostatic hypotension: Secondary | ICD-10-CM | POA: Diagnosis not present

## 2018-03-07 DIAGNOSIS — I480 Paroxysmal atrial fibrillation: Secondary | ICD-10-CM | POA: Diagnosis not present

## 2018-03-13 DIAGNOSIS — M13841 Other specified arthritis, right hand: Secondary | ICD-10-CM | POA: Diagnosis not present

## 2018-03-13 DIAGNOSIS — Z4789 Encounter for other orthopedic aftercare: Secondary | ICD-10-CM | POA: Diagnosis not present

## 2018-03-13 DIAGNOSIS — M19022 Primary osteoarthritis, left elbow: Secondary | ICD-10-CM | POA: Diagnosis not present

## 2018-03-14 DIAGNOSIS — Z0189 Encounter for other specified special examinations: Secondary | ICD-10-CM | POA: Diagnosis not present

## 2018-03-14 DIAGNOSIS — I951 Orthostatic hypotension: Secondary | ICD-10-CM | POA: Diagnosis not present

## 2018-03-14 DIAGNOSIS — I10 Essential (primary) hypertension: Secondary | ICD-10-CM | POA: Diagnosis not present

## 2018-03-21 DIAGNOSIS — I951 Orthostatic hypotension: Secondary | ICD-10-CM | POA: Diagnosis not present

## 2018-04-03 DIAGNOSIS — M19042 Primary osteoarthritis, left hand: Secondary | ICD-10-CM | POA: Diagnosis not present

## 2018-04-09 DIAGNOSIS — G8918 Other acute postprocedural pain: Secondary | ICD-10-CM | POA: Diagnosis not present

## 2018-04-09 DIAGNOSIS — M1812 Unilateral primary osteoarthritis of first carpometacarpal joint, left hand: Secondary | ICD-10-CM | POA: Diagnosis not present

## 2018-04-09 DIAGNOSIS — M19042 Primary osteoarthritis, left hand: Secondary | ICD-10-CM | POA: Diagnosis not present

## 2018-04-23 DIAGNOSIS — I10 Essential (primary) hypertension: Secondary | ICD-10-CM | POA: Diagnosis not present

## 2018-04-23 DIAGNOSIS — I951 Orthostatic hypotension: Secondary | ICD-10-CM | POA: Diagnosis not present

## 2018-04-23 DIAGNOSIS — I4891 Unspecified atrial fibrillation: Secondary | ICD-10-CM | POA: Diagnosis not present

## 2018-04-24 DIAGNOSIS — Z4789 Encounter for other orthopedic aftercare: Secondary | ICD-10-CM | POA: Diagnosis not present

## 2018-04-24 DIAGNOSIS — M13841 Other specified arthritis, right hand: Secondary | ICD-10-CM | POA: Diagnosis not present

## 2018-04-25 DIAGNOSIS — M79676 Pain in unspecified toe(s): Secondary | ICD-10-CM | POA: Diagnosis not present

## 2018-04-25 DIAGNOSIS — Z6826 Body mass index (BMI) 26.0-26.9, adult: Secondary | ICD-10-CM | POA: Diagnosis not present

## 2018-04-26 DIAGNOSIS — Z4789 Encounter for other orthopedic aftercare: Secondary | ICD-10-CM | POA: Diagnosis not present

## 2018-04-30 DIAGNOSIS — Z0189 Encounter for other specified special examinations: Secondary | ICD-10-CM | POA: Diagnosis not present

## 2018-04-30 DIAGNOSIS — I10 Essential (primary) hypertension: Secondary | ICD-10-CM | POA: Diagnosis not present

## 2018-04-30 DIAGNOSIS — I951 Orthostatic hypotension: Secondary | ICD-10-CM | POA: Diagnosis not present

## 2018-04-30 DIAGNOSIS — I480 Paroxysmal atrial fibrillation: Secondary | ICD-10-CM | POA: Diagnosis not present

## 2018-05-08 DIAGNOSIS — M25542 Pain in joints of left hand: Secondary | ICD-10-CM | POA: Insufficient documentation

## 2018-05-08 DIAGNOSIS — Z4789 Encounter for other orthopedic aftercare: Secondary | ICD-10-CM | POA: Diagnosis not present

## 2018-05-08 DIAGNOSIS — M79645 Pain in left finger(s): Secondary | ICD-10-CM | POA: Diagnosis not present

## 2018-05-08 DIAGNOSIS — M79644 Pain in right finger(s): Secondary | ICD-10-CM | POA: Diagnosis not present

## 2018-05-10 DIAGNOSIS — I480 Paroxysmal atrial fibrillation: Secondary | ICD-10-CM | POA: Diagnosis not present

## 2018-05-10 DIAGNOSIS — Z0189 Encounter for other specified special examinations: Secondary | ICD-10-CM | POA: Diagnosis not present

## 2018-05-10 DIAGNOSIS — I10 Essential (primary) hypertension: Secondary | ICD-10-CM | POA: Diagnosis not present

## 2018-05-10 DIAGNOSIS — I951 Orthostatic hypotension: Secondary | ICD-10-CM | POA: Diagnosis not present

## 2018-05-28 ENCOUNTER — Inpatient Hospital Stay: Payer: PPO | Attending: Hematology and Oncology | Admitting: Hematology and Oncology

## 2018-05-28 ENCOUNTER — Telehealth: Payer: Self-pay | Admitting: Hematology and Oncology

## 2018-05-28 DIAGNOSIS — E039 Hypothyroidism, unspecified: Secondary | ICD-10-CM

## 2018-05-28 DIAGNOSIS — Z79899 Other long term (current) drug therapy: Secondary | ICD-10-CM

## 2018-05-28 DIAGNOSIS — Z170 Estrogen receptor positive status [ER+]: Secondary | ICD-10-CM | POA: Diagnosis not present

## 2018-05-28 DIAGNOSIS — Z7982 Long term (current) use of aspirin: Secondary | ICD-10-CM | POA: Diagnosis not present

## 2018-05-28 DIAGNOSIS — C50212 Malignant neoplasm of upper-inner quadrant of left female breast: Secondary | ICD-10-CM

## 2018-05-28 NOTE — Progress Notes (Signed)
Patient Care Team: [name], MD as PCP - General (Internal Medicine)  DIAGNOSIS:  Encounter Diagnosis  Name Primary?  . Malignant neoplasm of upper-inner quadrant of left breast in female, estrogen receptor positive ([place])     SUMMARY OF ONCOLOGIC HISTORY:   Malignant neoplasm of upper-inner quadrant of left breast in female, estrogen receptor positive ([place])   05/15/2017 Initial Diagnosis    Malignant neoplasm of upper-inner quadrant of left breast in female, estrogen receptor positive ([place]): Microscopic focus suspicious for IDC low-grade    05/30/2017 Surgery    Left lumpectomy: IDC grade 1, 0.8 cm, with low-grade DCIS, ER 100%, PR 50%, Ki-67 5%, HER-2 negative ratio 1.5, T1BN0 stage I a     Miscellaneous    Refused radiation and Anti-estrogen therapy     CHIEF COMPLIANT: Surveillance of breast cancer  INTERVAL HISTORY: Heidi Adams is a 82 year old with above-mentioned history of left breast cancer treated with lumpectomy and refused radiation and antiestrogen therapy.  She is here for routine follow-up.  She had recent mammograms which were negative.  She has had surgery in her right thumb which appears to have healed well.  She has to have another surgery in the left thumb.  She does me that overall she does not feel well although they cannot find anything particularly wrong.  She has hypothyroidism and her blood pressure issues.  Her blood pressure is being managed by Dr. [name].  Denies any pain in the breast.  REVIEW OF SYSTEMS:   Constitutional: Generalized feeling of illness Eyes: Denies blurriness of vision Ears, nose, mouth, throat, and face: Denies mucositis or sore throat Respiratory: Denies cough, dyspnea or wheezes Cardiovascular: Denies palpitation, chest discomfort Gastrointestinal:  Denies nausea, heartburn or change in bowel habits Skin: Denies abnormal skin rashes Lymphatics: Denies new lymphadenopathy or easy bruising Neurological:Denies numbness,  tingling or new weaknesses Behavioral/Psych: Mood is stable, no new changes  Extremities: No lower extremity edema Breast:  denies any pain or lumps or nodules in either breasts All other systems were reviewed with the patient and are negative.  I have reviewed the past medical history, past surgical history, social history and family history with the patient and they are unchanged from previous note.  ALLERGIES:  is allergic to codeine; epinephrine; and tenormin [atenolol].  MEDICATIONS:  Current Outpatient Medications  Medication Sig Dispense Refill  . acetaminophen (TYLENOL) 500 MG tablet Take 1,000 mg once by mouth.    Marland Kitchen aspirin EC 81 MG tablet Take 81 mg by mouth daily.    . cholecalciferol (VITAMIN D) 1000 units tablet Take 1,000 Units by mouth daily. 2075m    . fluconazole (DIFLUCAN) 150 MG tablet Take one tablet by mouth daily for 5 days. 5 tablet 0  . hydrochlorothiazide (HYDRODIURIL) 25 MG tablet Take 12.5 mg by mouth daily.    .Marland Kitchenlevothyroxine (SYNTHROID, LEVOTHROID) 112 MCG tablet Take 112 mcg by mouth daily before breakfast.    . nadolol (CORGARD) 20 MG tablet Take 20 mg by mouth daily.    . NONFORMULARY OR COMPOUNDED ITEM Triam 0.1% CR-SSD 1% 1:1( triamcinolone cream 0.1cream /SSD Silvaden 1% cream ) apply to affected area twice daily 60 each 0  . pantoprazole (PROTONIX) 40 MG tablet Take 40 mg by mouth daily as needed (indigestion).     .Marland Kitchensenna (SENOKOT) 8.6 MG tablet Take 34.4 tablets by mouth daily as needed for constipation.     .Marland Kitchenterconazole (TERAZOL 7) 0.4 % vaginal cream Place 1 applicator vaginally at  bedtime. For 7 nights 45 g 0  . triamcinolone cream (KENALOG) 0.1 % Apply 1 application topically 2 (two) times daily.     No current facility-administered medications for this visit.     PHYSICAL EXAMINATION: ECOG PERFORMANCE STATUS: 1 - Symptomatic but completely ambulatory  Vitals:   05/28/18 1037  BP: (!) 137/102  Pulse: 92  Resp: 18  Temp: 98.6 F (37 C)    SpO2: 98%   Filed Weights   05/28/18 1037  Weight: 151 lb 11.2 oz (68.8 kg)    GENERAL:alert, no distress and comfortable SKIN: skin color, texture, turgor are normal, no rashes or significant lesions EYES: normal, Conjunctiva are pink and non-injected, sclera clear OROPHARYNX:no exudate, no erythema and lips, buccal mucosa, and tongue normal  NECK: supple, thyroid normal size, non-tender, without nodularity LYMPH:  no palpable lymphadenopathy in the cervical, axillary or inguinal LUNGS: clear to auscultation and percussion with normal breathing effort HEART: regular rate & rhythm and no murmurs and no lower extremity edema ABDOMEN:abdomen soft, non-tender and normal bowel sounds MUSCULOSKELETAL:no cyanosis of digits and no clubbing  NEURO: alert & oriented x 3 with fluent speech, no focal motor/sensory deficits EXTREMITIES: No lower extremity edema BREAST: No palpable masses or nodules in either right or left breasts. No palpable axillary supraclavicular or infraclavicular adenopathy no breast tenderness or nipple discharge. (exam performed in the presence of a chaperone)  LABORATORY DATA:  I have reviewed the data as listed CMP Latest Ref Rng & Units 05/26/2017 02/03/2017 01/11/2017  Glucose 65 - 99 mg/dL 89 89 107(H)  BUN 6 - 20 mg/dL 24(H) 15 16  Creatinine 0.44 - 1.00 mg/dL 0.82 0.56 0.50  Sodium 135 - 145 mmol/L 139 139 139  Potassium 3.5 - 5.1 mmol/L 4.2 3.4(L) 3.7  Chloride 101 - 111 mmol/L 106 104 104  CO2 22 - 32 mmol/L 26 27 26   Calcium 8.9 - 10.3 mg/dL 9.5 8.9 8.8(L)    Lab Results  Component Value Date   WBC 7.7 02/03/2017   HGB 10.2 (L) 02/03/2017   HCT 33.1 (L) 02/03/2017   MCV 89.5 02/03/2017   PLT 239 02/03/2017    ASSESSMENT & PLAN:  Malignant neoplasm of upper-inner quadrant of left breast in female, estrogen receptor positive ([facility]) 05/30/2017:Left lumpectomy: IDC grade 1, 0.8 cm, with low-grade DCIS, ER 100%, PR 50%, Ki-67 5%, HER-2 negative ratio 1.5,  T1BN0 stage I a  Did not receive adj XRT Patient also refused antiestrogen therapy  Current treatment: Surveillance 1.  Breast exams 05/28/2018: Benign 2. mammogram: 12/25/2017: No evidence of malignancy breast density category B  Patient tells me that her biggest issues are related to blood pressure and not feeling well.      Return to clinic in 1 year for follow-up with long-term survivorship.  No orders of the defined types were placed in this encounter.  The patient has a good understanding of the overall plan. she agrees with it. she will call with any problems that may develop before the next visit here.   Harriette Ohara, MD 05/28/18

## 2018-05-28 NOTE — Assessment & Plan Note (Signed)
05/30/2017:Left lumpectomy: IDC grade 1, 0.8 cm, with low-grade DCIS, ER 100%, PR 50%, Ki-67 5%, HER-2 negative ratio 1.5, T1BN0 stage I a  Did not receive adj XRT Patient also refused antiestrogen therapy  Current treatment: Surveillance 1.  Breast exams 01/25/2018: Benign 2. mammogram: 12/25/2017: No evidence of malignancy breast density category B  Return to clinic in 1 year for follow-up

## 2018-05-28 NOTE — Telephone Encounter (Signed)
Printed calendar and avs. °

## 2018-05-29 DIAGNOSIS — M13841 Other specified arthritis, right hand: Secondary | ICD-10-CM | POA: Diagnosis not present

## 2018-05-29 DIAGNOSIS — M19022 Primary osteoarthritis, left elbow: Secondary | ICD-10-CM | POA: Diagnosis not present

## 2018-05-29 DIAGNOSIS — Z4789 Encounter for other orthopedic aftercare: Secondary | ICD-10-CM | POA: Diagnosis not present

## 2018-06-05 DIAGNOSIS — M5022 Other cervical disc displacement, mid-cervical region, unspecified level: Secondary | ICD-10-CM | POA: Diagnosis not present

## 2018-06-05 DIAGNOSIS — M9903 Segmental and somatic dysfunction of lumbar region: Secondary | ICD-10-CM | POA: Diagnosis not present

## 2018-06-05 DIAGNOSIS — M9901 Segmental and somatic dysfunction of cervical region: Secondary | ICD-10-CM | POA: Diagnosis not present

## 2018-06-05 DIAGNOSIS — M5126 Other intervertebral disc displacement, lumbar region: Secondary | ICD-10-CM | POA: Diagnosis not present

## 2018-06-05 DIAGNOSIS — M5124 Other intervertebral disc displacement, thoracic region: Secondary | ICD-10-CM | POA: Diagnosis not present

## 2018-06-05 DIAGNOSIS — M9902 Segmental and somatic dysfunction of thoracic region: Secondary | ICD-10-CM | POA: Diagnosis not present

## 2018-06-06 DIAGNOSIS — I951 Orthostatic hypotension: Secondary | ICD-10-CM | POA: Diagnosis not present

## 2018-06-06 DIAGNOSIS — I480 Paroxysmal atrial fibrillation: Secondary | ICD-10-CM | POA: Diagnosis not present

## 2018-06-06 DIAGNOSIS — I10 Essential (primary) hypertension: Secondary | ICD-10-CM | POA: Diagnosis not present

## 2018-06-06 DIAGNOSIS — I420 Dilated cardiomyopathy: Secondary | ICD-10-CM | POA: Diagnosis not present

## 2018-06-07 DIAGNOSIS — M9901 Segmental and somatic dysfunction of cervical region: Secondary | ICD-10-CM | POA: Diagnosis not present

## 2018-06-07 DIAGNOSIS — M9902 Segmental and somatic dysfunction of thoracic region: Secondary | ICD-10-CM | POA: Diagnosis not present

## 2018-06-07 DIAGNOSIS — M5126 Other intervertebral disc displacement, lumbar region: Secondary | ICD-10-CM | POA: Diagnosis not present

## 2018-06-07 DIAGNOSIS — M5022 Other cervical disc displacement, mid-cervical region, unspecified level: Secondary | ICD-10-CM | POA: Diagnosis not present

## 2018-06-07 DIAGNOSIS — M5124 Other intervertebral disc displacement, thoracic region: Secondary | ICD-10-CM | POA: Diagnosis not present

## 2018-06-07 DIAGNOSIS — M9903 Segmental and somatic dysfunction of lumbar region: Secondary | ICD-10-CM | POA: Diagnosis not present

## 2018-06-08 DIAGNOSIS — Z96652 Presence of left artificial knee joint: Secondary | ICD-10-CM | POA: Diagnosis not present

## 2018-06-08 DIAGNOSIS — Z96641 Presence of right artificial hip joint: Secondary | ICD-10-CM | POA: Diagnosis not present

## 2018-06-08 DIAGNOSIS — M25562 Pain in left knee: Secondary | ICD-10-CM | POA: Diagnosis not present

## 2018-06-08 DIAGNOSIS — M25552 Pain in left hip: Secondary | ICD-10-CM | POA: Diagnosis not present

## 2018-06-08 DIAGNOSIS — Z96642 Presence of left artificial hip joint: Secondary | ICD-10-CM | POA: Diagnosis not present

## 2018-06-08 DIAGNOSIS — R2689 Other abnormalities of gait and mobility: Secondary | ICD-10-CM | POA: Diagnosis not present

## 2018-06-08 DIAGNOSIS — M62561 Muscle wasting and atrophy, not elsewhere classified, right lower leg: Secondary | ICD-10-CM | POA: Diagnosis not present

## 2018-06-08 DIAGNOSIS — M62562 Muscle wasting and atrophy, not elsewhere classified, left lower leg: Secondary | ICD-10-CM | POA: Diagnosis not present

## 2018-06-11 DIAGNOSIS — M9903 Segmental and somatic dysfunction of lumbar region: Secondary | ICD-10-CM | POA: Diagnosis not present

## 2018-06-11 DIAGNOSIS — M9901 Segmental and somatic dysfunction of cervical region: Secondary | ICD-10-CM | POA: Diagnosis not present

## 2018-06-11 DIAGNOSIS — M5126 Other intervertebral disc displacement, lumbar region: Secondary | ICD-10-CM | POA: Diagnosis not present

## 2018-06-11 DIAGNOSIS — M5124 Other intervertebral disc displacement, thoracic region: Secondary | ICD-10-CM | POA: Diagnosis not present

## 2018-06-11 DIAGNOSIS — M5022 Other cervical disc displacement, mid-cervical region, unspecified level: Secondary | ICD-10-CM | POA: Diagnosis not present

## 2018-06-11 DIAGNOSIS — M9902 Segmental and somatic dysfunction of thoracic region: Secondary | ICD-10-CM | POA: Diagnosis not present

## 2018-06-12 DIAGNOSIS — I10 Essential (primary) hypertension: Secondary | ICD-10-CM | POA: Diagnosis not present

## 2018-06-12 DIAGNOSIS — M1990 Unspecified osteoarthritis, unspecified site: Secondary | ICD-10-CM | POA: Diagnosis not present

## 2018-06-12 DIAGNOSIS — I420 Dilated cardiomyopathy: Secondary | ICD-10-CM | POA: Diagnosis not present

## 2018-06-12 DIAGNOSIS — D649 Anemia, unspecified: Secondary | ICD-10-CM | POA: Diagnosis not present

## 2018-06-12 DIAGNOSIS — E038 Other specified hypothyroidism: Secondary | ICD-10-CM | POA: Diagnosis not present

## 2018-06-12 DIAGNOSIS — Z23 Encounter for immunization: Secondary | ICD-10-CM | POA: Diagnosis not present

## 2018-06-12 DIAGNOSIS — I951 Orthostatic hypotension: Secondary | ICD-10-CM | POA: Diagnosis not present

## 2018-06-12 DIAGNOSIS — M79676 Pain in unspecified toe(s): Secondary | ICD-10-CM | POA: Diagnosis not present

## 2018-06-12 DIAGNOSIS — I480 Paroxysmal atrial fibrillation: Secondary | ICD-10-CM | POA: Diagnosis not present

## 2018-06-12 DIAGNOSIS — Z1389 Encounter for screening for other disorder: Secondary | ICD-10-CM | POA: Diagnosis not present

## 2018-06-12 DIAGNOSIS — Z6826 Body mass index (BMI) 26.0-26.9, adult: Secondary | ICD-10-CM | POA: Diagnosis not present

## 2018-06-12 DIAGNOSIS — I878 Other specified disorders of veins: Secondary | ICD-10-CM | POA: Diagnosis not present

## 2018-06-13 DIAGNOSIS — M9903 Segmental and somatic dysfunction of lumbar region: Secondary | ICD-10-CM | POA: Diagnosis not present

## 2018-06-13 DIAGNOSIS — M9901 Segmental and somatic dysfunction of cervical region: Secondary | ICD-10-CM | POA: Diagnosis not present

## 2018-06-13 DIAGNOSIS — M9902 Segmental and somatic dysfunction of thoracic region: Secondary | ICD-10-CM | POA: Diagnosis not present

## 2018-06-13 DIAGNOSIS — M5126 Other intervertebral disc displacement, lumbar region: Secondary | ICD-10-CM | POA: Diagnosis not present

## 2018-06-13 DIAGNOSIS — M5022 Other cervical disc displacement, mid-cervical region, unspecified level: Secondary | ICD-10-CM | POA: Diagnosis not present

## 2018-06-13 DIAGNOSIS — M5124 Other intervertebral disc displacement, thoracic region: Secondary | ICD-10-CM | POA: Diagnosis not present

## 2018-06-18 DIAGNOSIS — M5126 Other intervertebral disc displacement, lumbar region: Secondary | ICD-10-CM | POA: Diagnosis not present

## 2018-06-18 DIAGNOSIS — M9902 Segmental and somatic dysfunction of thoracic region: Secondary | ICD-10-CM | POA: Diagnosis not present

## 2018-06-18 DIAGNOSIS — M9903 Segmental and somatic dysfunction of lumbar region: Secondary | ICD-10-CM | POA: Diagnosis not present

## 2018-06-18 DIAGNOSIS — M5124 Other intervertebral disc displacement, thoracic region: Secondary | ICD-10-CM | POA: Diagnosis not present

## 2018-06-18 DIAGNOSIS — M9901 Segmental and somatic dysfunction of cervical region: Secondary | ICD-10-CM | POA: Diagnosis not present

## 2018-06-18 DIAGNOSIS — M5022 Other cervical disc displacement, mid-cervical region, unspecified level: Secondary | ICD-10-CM | POA: Diagnosis not present

## 2018-06-20 DIAGNOSIS — M1812 Unilateral primary osteoarthritis of first carpometacarpal joint, left hand: Secondary | ICD-10-CM | POA: Insufficient documentation

## 2018-06-25 DIAGNOSIS — M9903 Segmental and somatic dysfunction of lumbar region: Secondary | ICD-10-CM | POA: Diagnosis not present

## 2018-06-25 DIAGNOSIS — M9901 Segmental and somatic dysfunction of cervical region: Secondary | ICD-10-CM | POA: Diagnosis not present

## 2018-06-25 DIAGNOSIS — M9902 Segmental and somatic dysfunction of thoracic region: Secondary | ICD-10-CM | POA: Diagnosis not present

## 2018-06-25 DIAGNOSIS — M5126 Other intervertebral disc displacement, lumbar region: Secondary | ICD-10-CM | POA: Diagnosis not present

## 2018-06-25 DIAGNOSIS — M5124 Other intervertebral disc displacement, thoracic region: Secondary | ICD-10-CM | POA: Diagnosis not present

## 2018-06-25 DIAGNOSIS — M5022 Other cervical disc displacement, mid-cervical region, unspecified level: Secondary | ICD-10-CM | POA: Diagnosis not present

## 2018-06-26 DIAGNOSIS — M13842 Other specified arthritis, left hand: Secondary | ICD-10-CM | POA: Diagnosis not present

## 2018-06-26 DIAGNOSIS — M79641 Pain in right hand: Secondary | ICD-10-CM | POA: Diagnosis not present

## 2018-06-28 DIAGNOSIS — M5126 Other intervertebral disc displacement, lumbar region: Secondary | ICD-10-CM | POA: Diagnosis not present

## 2018-06-28 DIAGNOSIS — M5124 Other intervertebral disc displacement, thoracic region: Secondary | ICD-10-CM | POA: Diagnosis not present

## 2018-06-28 DIAGNOSIS — M9902 Segmental and somatic dysfunction of thoracic region: Secondary | ICD-10-CM | POA: Diagnosis not present

## 2018-06-28 DIAGNOSIS — M9901 Segmental and somatic dysfunction of cervical region: Secondary | ICD-10-CM | POA: Diagnosis not present

## 2018-06-28 DIAGNOSIS — M5022 Other cervical disc displacement, mid-cervical region, unspecified level: Secondary | ICD-10-CM | POA: Diagnosis not present

## 2018-06-28 DIAGNOSIS — M9903 Segmental and somatic dysfunction of lumbar region: Secondary | ICD-10-CM | POA: Diagnosis not present

## 2018-07-02 DIAGNOSIS — M9902 Segmental and somatic dysfunction of thoracic region: Secondary | ICD-10-CM | POA: Diagnosis not present

## 2018-07-02 DIAGNOSIS — M5124 Other intervertebral disc displacement, thoracic region: Secondary | ICD-10-CM | POA: Diagnosis not present

## 2018-07-02 DIAGNOSIS — M5022 Other cervical disc displacement, mid-cervical region, unspecified level: Secondary | ICD-10-CM | POA: Diagnosis not present

## 2018-07-02 DIAGNOSIS — M9901 Segmental and somatic dysfunction of cervical region: Secondary | ICD-10-CM | POA: Diagnosis not present

## 2018-07-02 DIAGNOSIS — M9903 Segmental and somatic dysfunction of lumbar region: Secondary | ICD-10-CM | POA: Diagnosis not present

## 2018-07-02 DIAGNOSIS — M5126 Other intervertebral disc displacement, lumbar region: Secondary | ICD-10-CM | POA: Diagnosis not present

## 2018-07-03 DIAGNOSIS — M25562 Pain in left knee: Secondary | ICD-10-CM | POA: Diagnosis not present

## 2018-07-03 DIAGNOSIS — Z96641 Presence of right artificial hip joint: Secondary | ICD-10-CM | POA: Diagnosis not present

## 2018-07-03 DIAGNOSIS — M62561 Muscle wasting and atrophy, not elsewhere classified, right lower leg: Secondary | ICD-10-CM | POA: Diagnosis not present

## 2018-07-03 DIAGNOSIS — Z96652 Presence of left artificial knee joint: Secondary | ICD-10-CM | POA: Diagnosis not present

## 2018-07-03 DIAGNOSIS — M25552 Pain in left hip: Secondary | ICD-10-CM | POA: Diagnosis not present

## 2018-07-03 DIAGNOSIS — M62562 Muscle wasting and atrophy, not elsewhere classified, left lower leg: Secondary | ICD-10-CM | POA: Diagnosis not present

## 2018-07-03 DIAGNOSIS — Z96642 Presence of left artificial hip joint: Secondary | ICD-10-CM | POA: Diagnosis not present

## 2018-07-03 DIAGNOSIS — R2689 Other abnormalities of gait and mobility: Secondary | ICD-10-CM | POA: Diagnosis not present

## 2018-07-05 DIAGNOSIS — M9902 Segmental and somatic dysfunction of thoracic region: Secondary | ICD-10-CM | POA: Diagnosis not present

## 2018-07-05 DIAGNOSIS — M5126 Other intervertebral disc displacement, lumbar region: Secondary | ICD-10-CM | POA: Diagnosis not present

## 2018-07-05 DIAGNOSIS — M5124 Other intervertebral disc displacement, thoracic region: Secondary | ICD-10-CM | POA: Diagnosis not present

## 2018-07-05 DIAGNOSIS — M9901 Segmental and somatic dysfunction of cervical region: Secondary | ICD-10-CM | POA: Diagnosis not present

## 2018-07-05 DIAGNOSIS — M9903 Segmental and somatic dysfunction of lumbar region: Secondary | ICD-10-CM | POA: Diagnosis not present

## 2018-07-05 DIAGNOSIS — M5022 Other cervical disc displacement, mid-cervical region, unspecified level: Secondary | ICD-10-CM | POA: Diagnosis not present

## 2018-07-09 DIAGNOSIS — M9902 Segmental and somatic dysfunction of thoracic region: Secondary | ICD-10-CM | POA: Diagnosis not present

## 2018-07-09 DIAGNOSIS — M5126 Other intervertebral disc displacement, lumbar region: Secondary | ICD-10-CM | POA: Diagnosis not present

## 2018-07-09 DIAGNOSIS — M5124 Other intervertebral disc displacement, thoracic region: Secondary | ICD-10-CM | POA: Diagnosis not present

## 2018-07-09 DIAGNOSIS — M9901 Segmental and somatic dysfunction of cervical region: Secondary | ICD-10-CM | POA: Diagnosis not present

## 2018-07-09 DIAGNOSIS — M9903 Segmental and somatic dysfunction of lumbar region: Secondary | ICD-10-CM | POA: Diagnosis not present

## 2018-07-09 DIAGNOSIS — M5022 Other cervical disc displacement, mid-cervical region, unspecified level: Secondary | ICD-10-CM | POA: Diagnosis not present

## 2018-07-12 DIAGNOSIS — M5022 Other cervical disc displacement, mid-cervical region, unspecified level: Secondary | ICD-10-CM | POA: Diagnosis not present

## 2018-07-12 DIAGNOSIS — M9901 Segmental and somatic dysfunction of cervical region: Secondary | ICD-10-CM | POA: Diagnosis not present

## 2018-07-12 DIAGNOSIS — M9902 Segmental and somatic dysfunction of thoracic region: Secondary | ICD-10-CM | POA: Diagnosis not present

## 2018-07-12 DIAGNOSIS — M5126 Other intervertebral disc displacement, lumbar region: Secondary | ICD-10-CM | POA: Diagnosis not present

## 2018-07-12 DIAGNOSIS — M9903 Segmental and somatic dysfunction of lumbar region: Secondary | ICD-10-CM | POA: Diagnosis not present

## 2018-07-12 DIAGNOSIS — M5124 Other intervertebral disc displacement, thoracic region: Secondary | ICD-10-CM | POA: Diagnosis not present

## 2018-07-16 DIAGNOSIS — M9903 Segmental and somatic dysfunction of lumbar region: Secondary | ICD-10-CM | POA: Diagnosis not present

## 2018-07-16 DIAGNOSIS — M5126 Other intervertebral disc displacement, lumbar region: Secondary | ICD-10-CM | POA: Diagnosis not present

## 2018-07-16 DIAGNOSIS — M9901 Segmental and somatic dysfunction of cervical region: Secondary | ICD-10-CM | POA: Diagnosis not present

## 2018-07-16 DIAGNOSIS — M9902 Segmental and somatic dysfunction of thoracic region: Secondary | ICD-10-CM | POA: Diagnosis not present

## 2018-07-16 DIAGNOSIS — M5124 Other intervertebral disc displacement, thoracic region: Secondary | ICD-10-CM | POA: Diagnosis not present

## 2018-07-16 DIAGNOSIS — M5022 Other cervical disc displacement, mid-cervical region, unspecified level: Secondary | ICD-10-CM | POA: Diagnosis not present

## 2018-07-30 ENCOUNTER — Emergency Department (HOSPITAL_COMMUNITY): Payer: PPO

## 2018-07-30 ENCOUNTER — Encounter (HOSPITAL_COMMUNITY): Payer: Self-pay | Admitting: Emergency Medicine

## 2018-07-30 ENCOUNTER — Observation Stay (HOSPITAL_COMMUNITY): Payer: PPO

## 2018-07-30 ENCOUNTER — Other Ambulatory Visit: Payer: Self-pay

## 2018-07-30 ENCOUNTER — Observation Stay (HOSPITAL_COMMUNITY)
Admission: EM | Admit: 2018-07-30 | Discharge: 2018-07-31 | Disposition: A | Discharge status: Home / Self Care | Payer: PPO | Attending: Family Medicine | Admitting: Family Medicine

## 2018-07-30 ENCOUNTER — Ambulatory Visit (HOSPITAL_COMMUNITY): Payer: PPO

## 2018-07-30 DIAGNOSIS — I160 Hypertensive urgency: Secondary | ICD-10-CM | POA: Insufficient documentation

## 2018-07-30 DIAGNOSIS — Z79899 Other long term (current) drug therapy: Secondary | ICD-10-CM | POA: Diagnosis not present

## 2018-07-30 DIAGNOSIS — G459 Transient cerebral ischemic attack, unspecified: Secondary | ICD-10-CM | POA: Diagnosis not present

## 2018-07-30 DIAGNOSIS — R479 Unspecified speech disturbances: Secondary | ICD-10-CM | POA: Diagnosis not present

## 2018-07-30 DIAGNOSIS — E039 Hypothyroidism, unspecified: Secondary | ICD-10-CM | POA: Insufficient documentation

## 2018-07-30 DIAGNOSIS — Z79890 Hormone replacement therapy (postmenopausal): Secondary | ICD-10-CM | POA: Diagnosis not present

## 2018-07-30 DIAGNOSIS — M19022 Primary osteoarthritis, left elbow: Secondary | ICD-10-CM | POA: Diagnosis not present

## 2018-07-30 DIAGNOSIS — M542 Cervicalgia: Secondary | ICD-10-CM | POA: Diagnosis present

## 2018-07-30 DIAGNOSIS — I674 Hypertensive encephalopathy: Secondary | ICD-10-CM | POA: Diagnosis not present

## 2018-07-30 DIAGNOSIS — Z85828 Personal history of other malignant neoplasm of skin: Secondary | ICD-10-CM | POA: Diagnosis not present

## 2018-07-30 DIAGNOSIS — I493 Ventricular premature depolarization: Secondary | ICD-10-CM | POA: Diagnosis not present

## 2018-07-30 DIAGNOSIS — Z853 Personal history of malignant neoplasm of breast: Secondary | ICD-10-CM | POA: Diagnosis not present

## 2018-07-30 DIAGNOSIS — I480 Paroxysmal atrial fibrillation: Secondary | ICD-10-CM | POA: Diagnosis present

## 2018-07-30 DIAGNOSIS — I951 Orthostatic hypotension: Secondary | ICD-10-CM | POA: Diagnosis not present

## 2018-07-30 DIAGNOSIS — H538 Other visual disturbances: Secondary | ICD-10-CM | POA: Diagnosis present

## 2018-07-30 DIAGNOSIS — Z87891 Personal history of nicotine dependence: Secondary | ICD-10-CM | POA: Diagnosis not present

## 2018-07-30 DIAGNOSIS — R410 Disorientation, unspecified: Secondary | ICD-10-CM | POA: Diagnosis not present

## 2018-07-30 DIAGNOSIS — I10 Essential (primary) hypertension: Secondary | ICD-10-CM | POA: Diagnosis not present

## 2018-07-30 DIAGNOSIS — I4891 Unspecified atrial fibrillation: Secondary | ICD-10-CM | POA: Diagnosis not present

## 2018-07-30 LAB — CBC WITH DIFFERENTIAL/PLATELET
Abs Immature Granulocytes: 0.03 10*3/uL (ref 0.00–0.07)
BASOS PCT: 0 %
Basophils Absolute: 0 10*3/uL (ref 0.0–0.1)
EOS PCT: 2 %
Eosinophils Absolute: 0.2 10*3/uL (ref 0.0–0.5)
HCT: 40.6 % (ref 36.0–46.0)
Hemoglobin: 12.7 g/dL (ref 12.0–15.0)
Immature Granulocytes: 0 %
Lymphocytes Relative: 17 %
Lymphs Abs: 1.2 10*3/uL (ref 0.7–4.0)
MCH: 29.6 pg (ref 26.0–34.0)
MCHC: 31.3 g/dL (ref 30.0–36.0)
MCV: 94.6 fL (ref 80.0–100.0)
Monocytes Absolute: 0.4 10*3/uL (ref 0.1–1.0)
Monocytes Relative: 6 %
NRBC: 0 % (ref 0.0–0.2)
Neutro Abs: 5.3 10*3/uL (ref 1.7–7.7)
Neutrophils Relative %: 75 %
Platelets: 254 10*3/uL (ref 150–400)
RBC: 4.29 MIL/uL (ref 3.87–5.11)
RDW: 13.6 % (ref 11.5–15.5)
WBC: 7.1 10*3/uL (ref 4.0–10.5)

## 2018-07-30 LAB — COMPREHENSIVE METABOLIC PANEL
ALT: 10 U/L (ref 0–44)
AST: 12 U/L — ABNORMAL LOW (ref 15–41)
Albumin: 3.5 g/dL (ref 3.5–5.0)
Alkaline Phosphatase: 75 U/L (ref 38–126)
Anion gap: 8 (ref 5–15)
BUN: 16 mg/dL (ref 8–23)
CO2: 24 mmol/L (ref 22–32)
Calcium: 8.9 mg/dL (ref 8.9–10.3)
Chloride: 108 mmol/L (ref 98–111)
Creatinine, Ser: 0.69 mg/dL (ref 0.44–1.00)
Glucose, Bld: 94 mg/dL (ref 70–99)
Potassium: 3.7 mmol/L (ref 3.5–5.1)
Sodium: 140 mmol/L (ref 135–145)
Total Bilirubin: 0.7 mg/dL (ref 0.3–1.2)
Total Protein: 6.3 g/dL — ABNORMAL LOW (ref 6.5–8.1)

## 2018-07-30 LAB — CBG MONITORING, ED: Glucose-Capillary: 89 mg/dL (ref 70–99)

## 2018-07-30 LAB — URINALYSIS, ROUTINE W REFLEX MICROSCOPIC
Bilirubin Urine: NEGATIVE
Glucose, UA: NEGATIVE mg/dL
Hgb urine dipstick: NEGATIVE
Ketones, ur: NEGATIVE mg/dL
Leukocytes, UA: NEGATIVE
Nitrite: NEGATIVE
PH: 7 (ref 5.0–8.0)
Protein, ur: NEGATIVE mg/dL
Specific Gravity, Urine: 1.006 (ref 1.005–1.030)

## 2018-07-30 LAB — I-STAT CHEM 8, ED
BUN: 14 mg/dL (ref 8–23)
Calcium, Ion: 1.2 mmol/L (ref 1.15–1.40)
Chloride: 107 mmol/L (ref 98–111)
Creatinine, Ser: 0.6 mg/dL (ref 0.44–1.00)
Glucose, Bld: 95 mg/dL (ref 70–99)
HCT: 39 % (ref 36.0–46.0)
Hemoglobin: 13.3 g/dL (ref 12.0–15.0)
Potassium: 3.7 mmol/L (ref 3.5–5.1)
Sodium: 138 mmol/L (ref 135–145)
TCO2: 24 mmol/L (ref 22–32)

## 2018-07-30 LAB — ECHOCARDIOGRAM COMPLETE
Height: 64 in
Weight: 2352 oz

## 2018-07-30 LAB — TROPONIN I: Troponin I: <0.03 ng/mL (ref <0.03)

## 2018-07-30 IMAGING — CT CT HEAD W/O CM
3 series · 15 of 46 positions shown, 18 images · non-contrast
Comparison: 06/21/2017

CLINICAL DATA: Confusion

EXAM:
CT HEAD WITHOUT CONTRAST
TECHNIQUE: Contiguous axial images were obtained from the base of the skull
through the vertex without intravenous contrast.

[Series 2: head wo · axial · 0.47mm/px · z∈[-129,-9]mm · 9 of 29 slices shown, 12 images]
[im 3/29  brain]
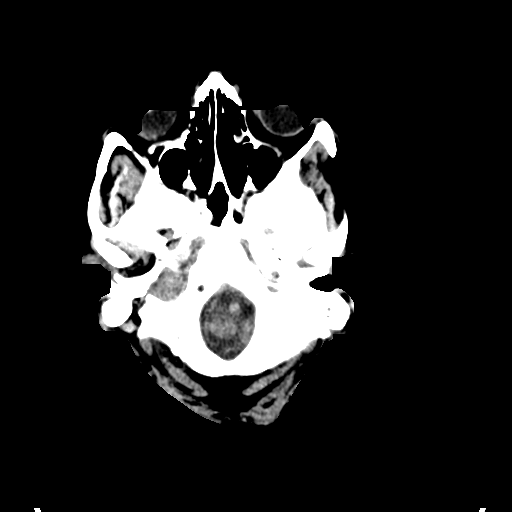
[im 3/29  bone]
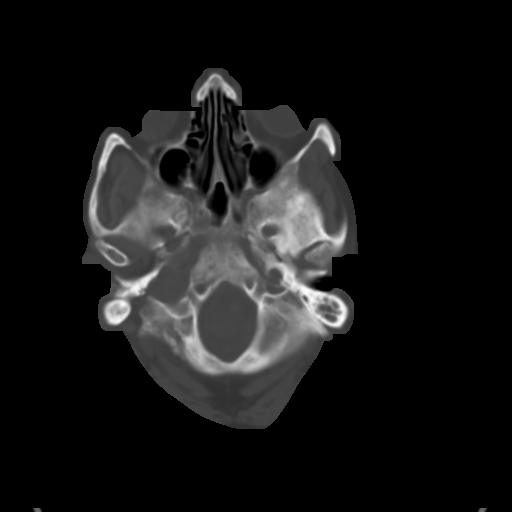
[im 6/29  brain]
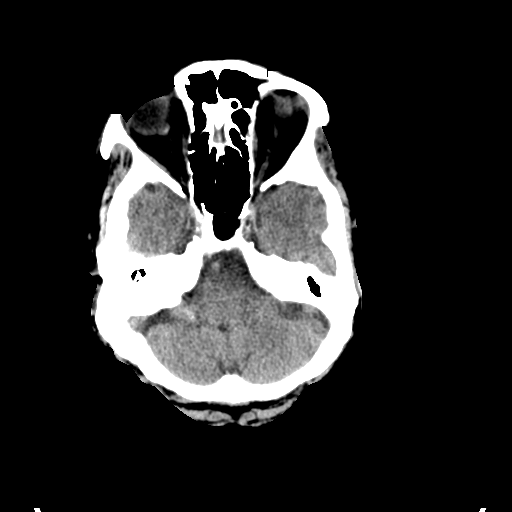
[im 9/29  brain]
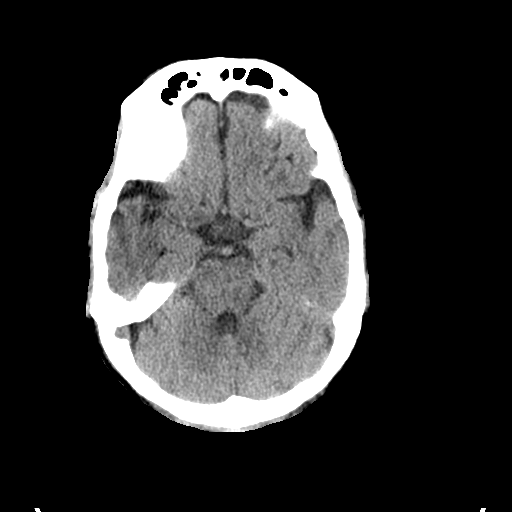
[im 12/29  brain]
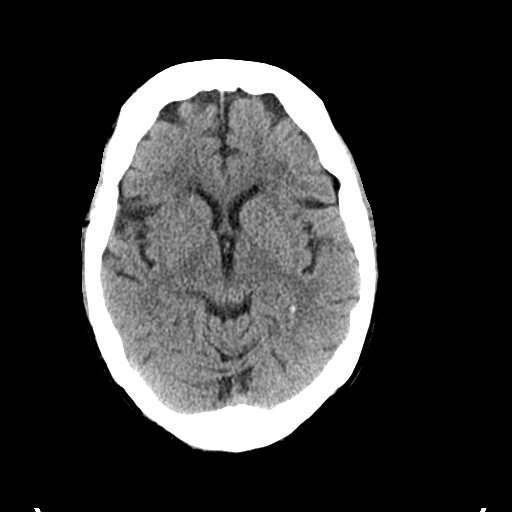
[im 15/29  brain]
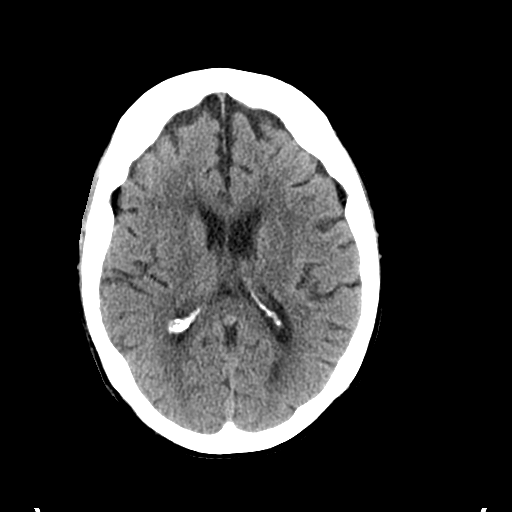
[im 15/29  bone]
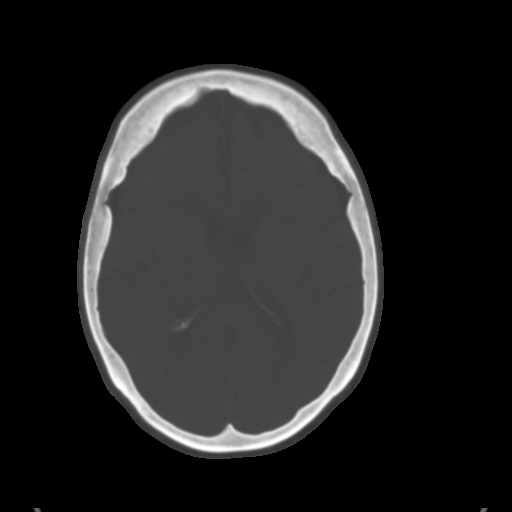
[im 18/29  brain]
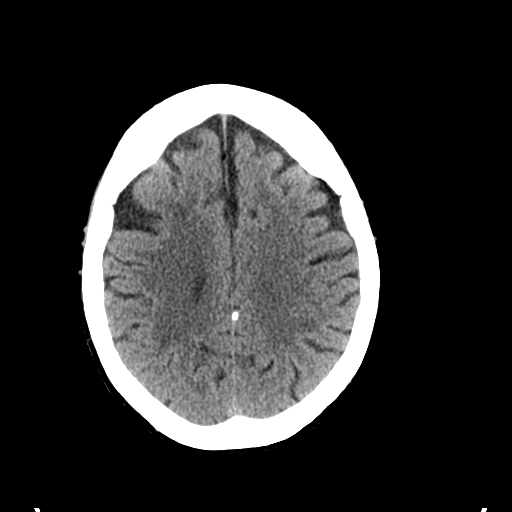
[im 21/29  brain]
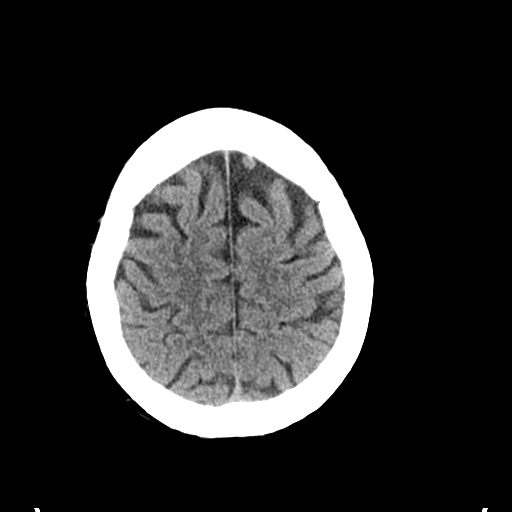
[im 24/29  brain]
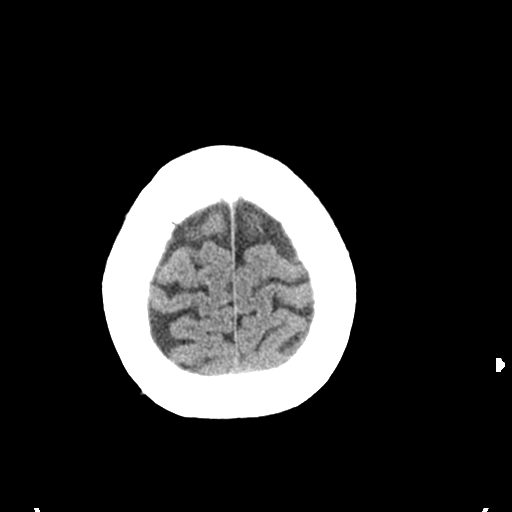
[im 27/29  brain]
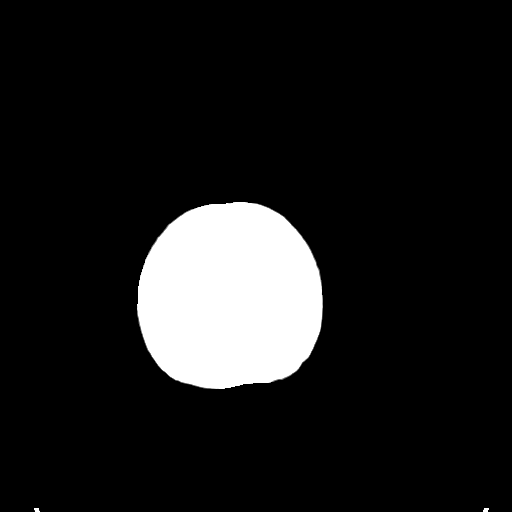
[im 27/29  bone]
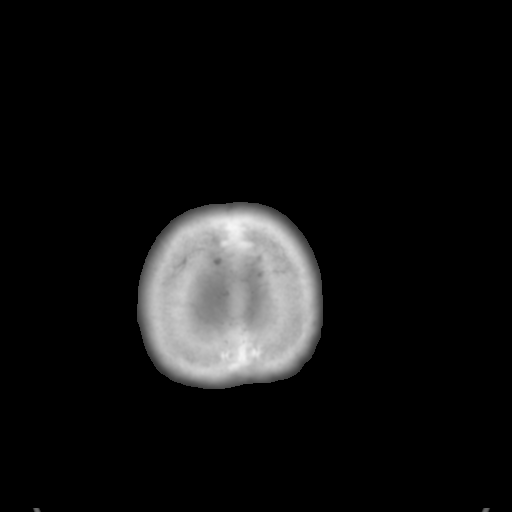

[Series 4: coronal soft tissue · coronal · 0.37mm/px · 3 of 64 slices shown]
[im 22/64  brain]
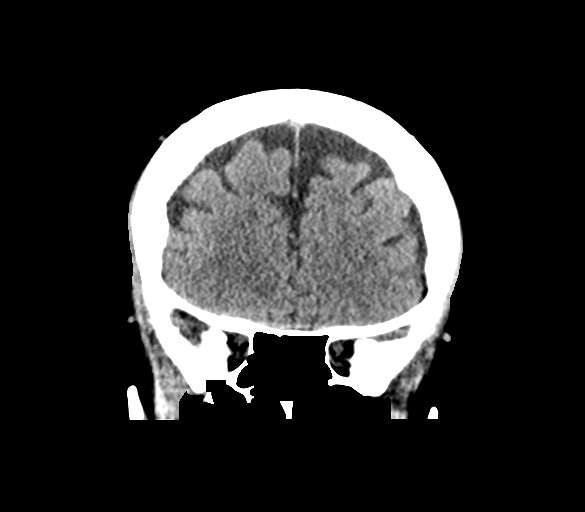
[im 29/64  brain]
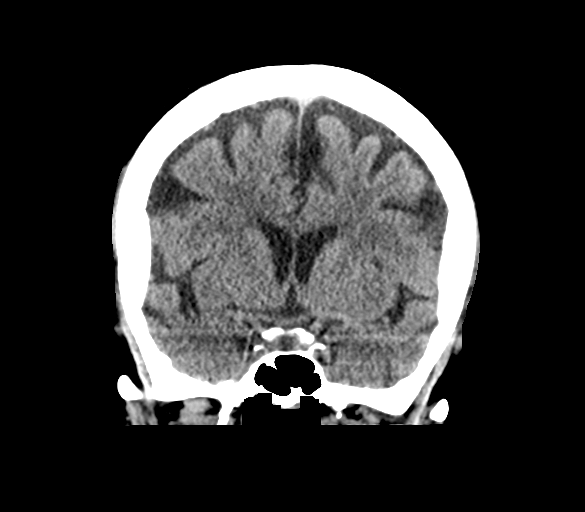
[im 36/64  brain]
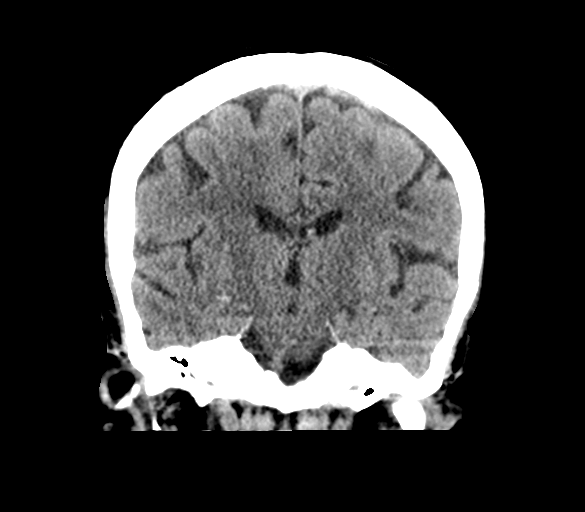

[Series 5: sagittal soft tissue · sagittal · 0.41mm/px · 3 of 51 slices shown]
[im 17/51  brain]
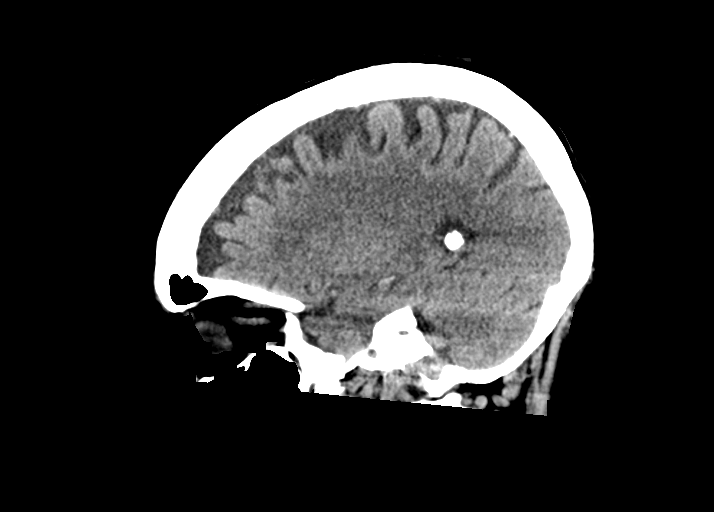
[im 26/51  brain]
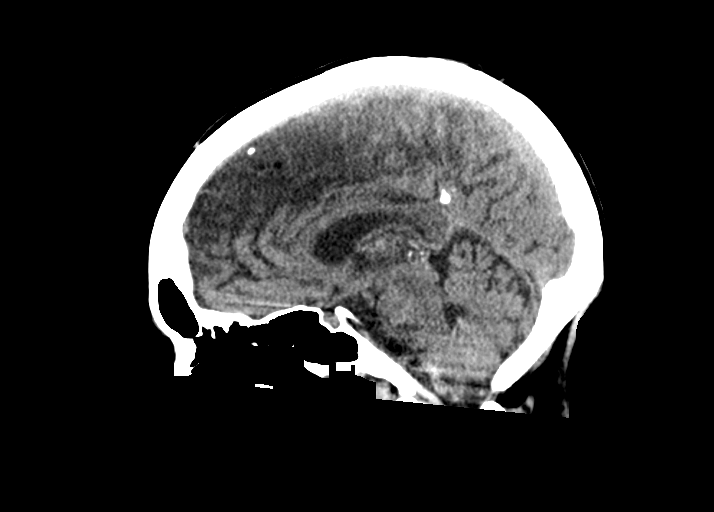
[im 34/51  brain]
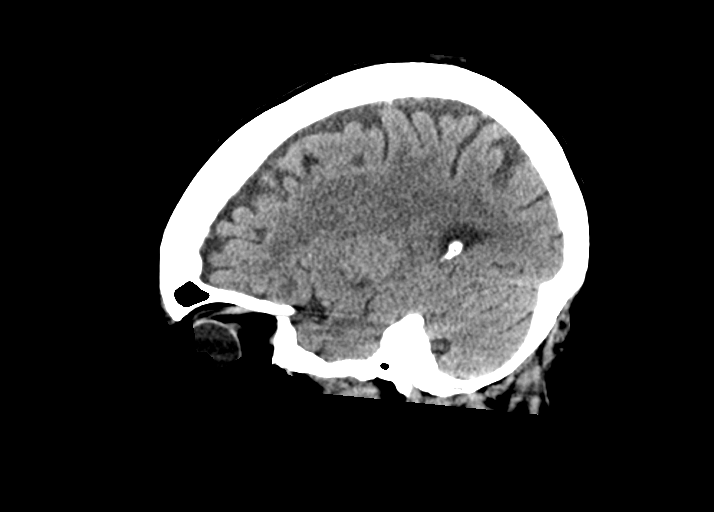

[15 of 46 positions shown; findings below may reference images not displayed]

FINDINGS: Brain: No evidence of acute infarction, hemorrhage, hydrocephalus,
extra-axial collection or mass lesion/mass effect. Chronic atrophic
and white matter ischemic changes are noted.

Vascular: No hyperdense vessel or unexpected calcification.

Skull: Normal. Negative for fracture or focal lesion.

Sinuses/Orbits: No acute finding.

Other: None.
IMPRESSION: Chronic atrophic and ischemic changes without acute abnormality.

## 2018-07-30 IMAGING — MR MR HEAD W/O CM
10 series · 46 of 48 positions shown · non-contrast
Comparison: CT 07/30/2018, MRI 12/26/2015

CLINICAL DATA: Focal neuro deficit less than 6 hours.  Confusion

EXAM:
MRI HEAD WITHOUT CONTRAST
TECHNIQUE: Multiplanar, multiecho pulse sequences of the brain and surrounding
structures were obtained without intravenous contrast.

[Series 3: DWI · axial · 3.0mm · 1.09mm/px · z∈[-49,+94]mm · 11 of 98 slices shown (1 of 4)]
[im 1/98]
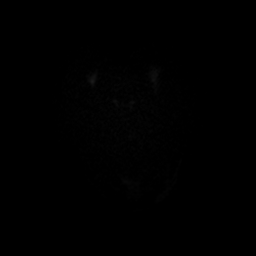
[im 10/98]
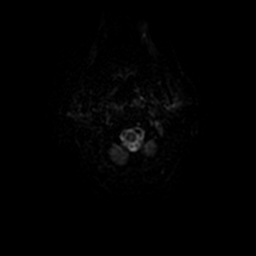
[im 20/98]
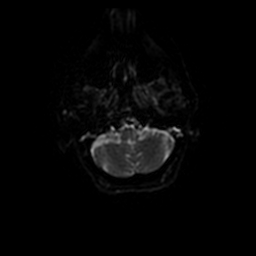
[im 30/98]
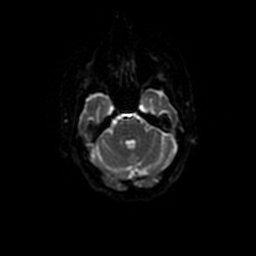
[im 39/98]
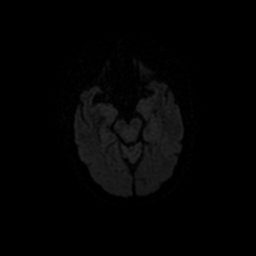
[im 49/98]
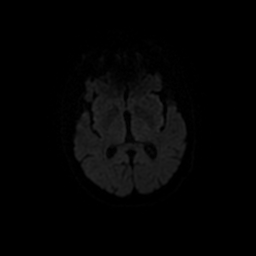
[im 59/98]
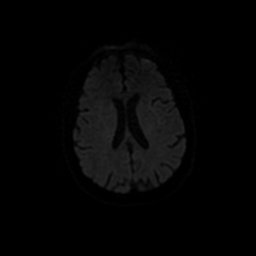
[im 68/98]
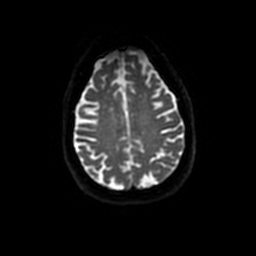
[im 78/98]
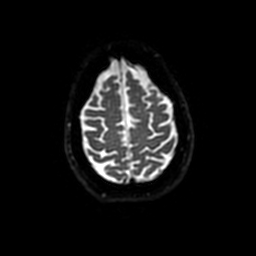
[im 88/98]
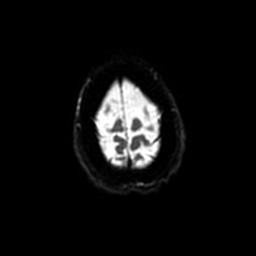
[im 98/98]
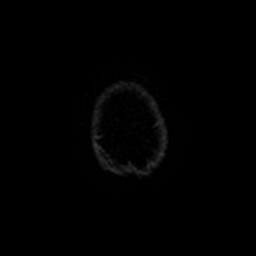

[Series 4: T1 · sagittal · 5.0mm · 0.47mm/px · 2 of 23 slices shown]
[im 1/23]
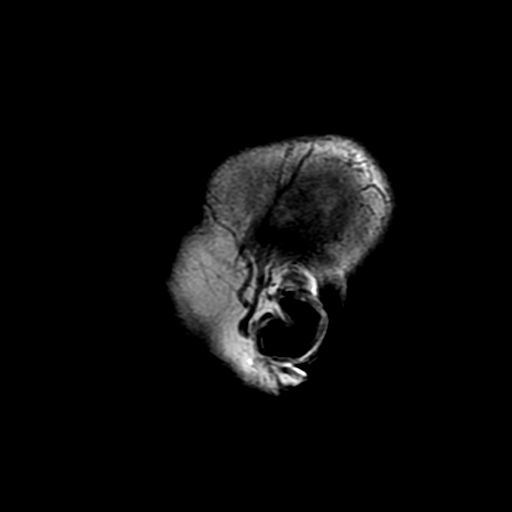
[im 23/23]
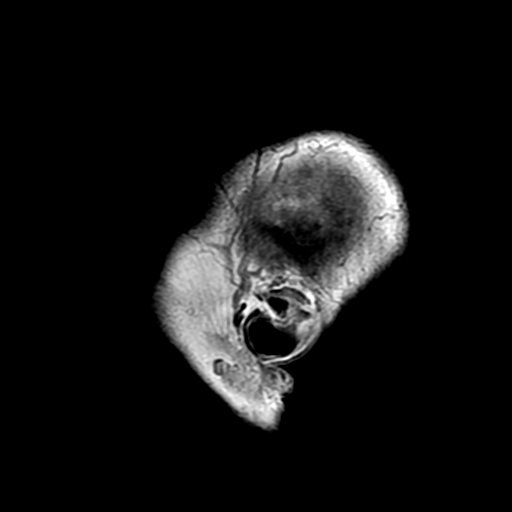

[Series 5: DWI · coronal · 3.0mm · 1.09mm/px · 11 of 112 slices shown (2 of 4)]
[im 1/112]
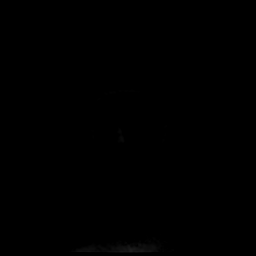
[im 12/112]
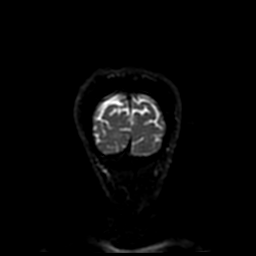
[im 23/112]
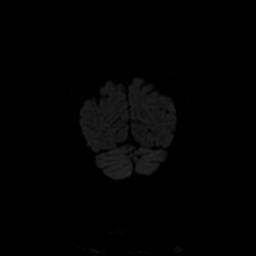
[im 34/112]
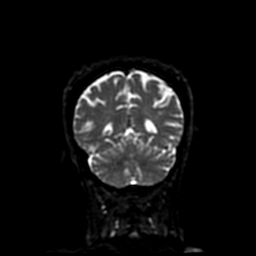
[im 45/112]
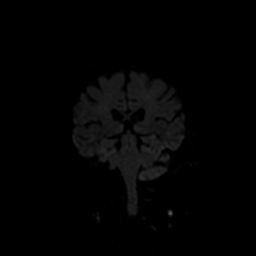
[im 56/112]
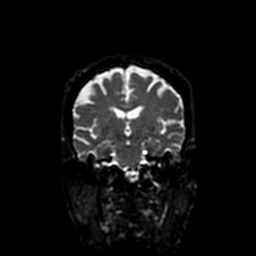
[im 67/112]
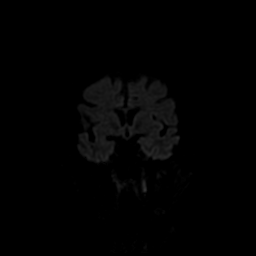
[im 78/112]
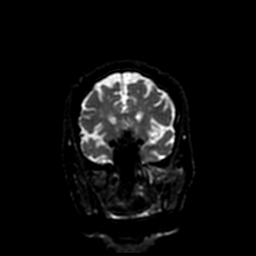
[im 89/112]
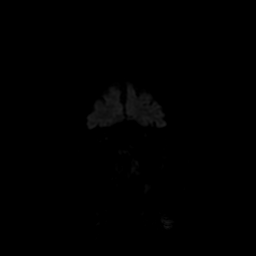
[im 100/112]
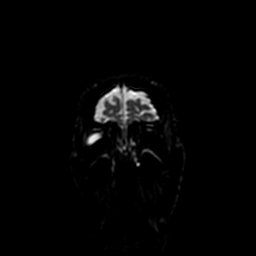
[im 112/112]
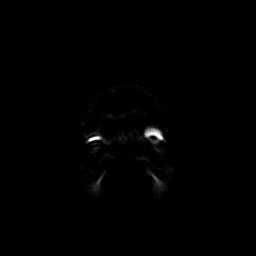

[Series 6: T2 · axial · 5.0mm · 0.86mm/px · z∈[-51,+92]mm · 2 of 25 slices shown (1 of 2)]
[im 1/25]
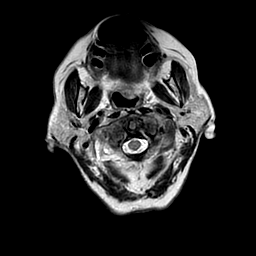
[im 25/25]
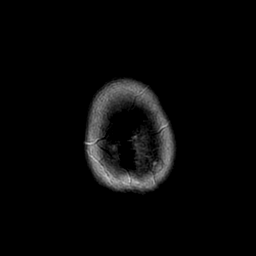

[Series 7: FLAIR · axial · 3.0mm · 0.86mm/px · z∈[-51,+92]mm · 2 of 25 slices shown]
[im 1/25]
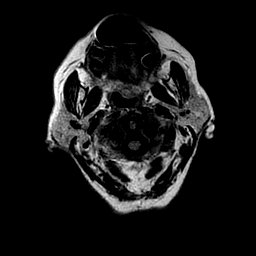
[im 25/25]
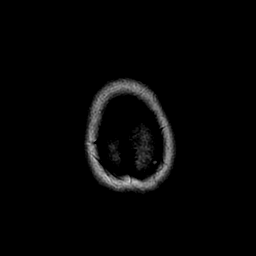

[Series 8: ax mpgr · axial · 5.0mm · 0.43mm/px · z∈[-63,+104]mm · 2 of 25 slices shown]
[im 1/25]
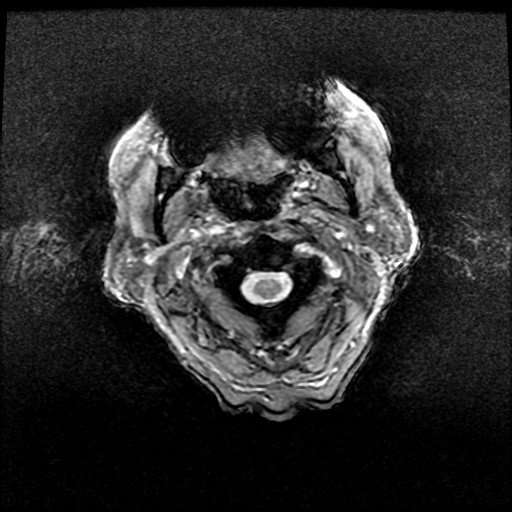
[im 25/25]
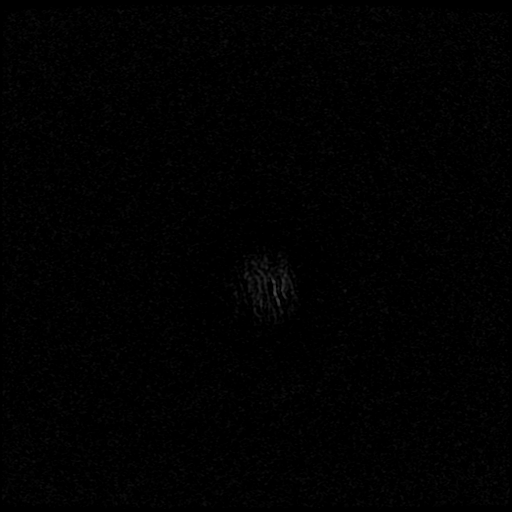

[Series 9: ax fspgr irp · axial · 3.0mm · 0.47mm/px · z∈[-55,+20]mm · 3 of 52 slices shown]
[im 1/52]
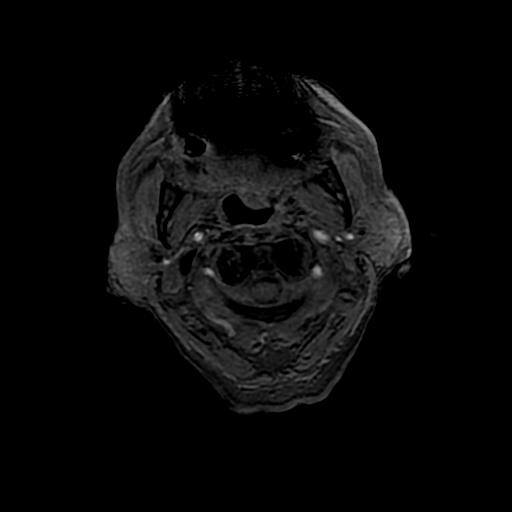
[im 13/52]
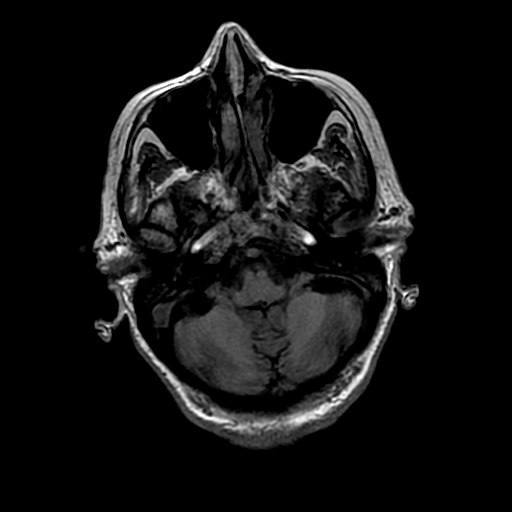
[im 26/52]
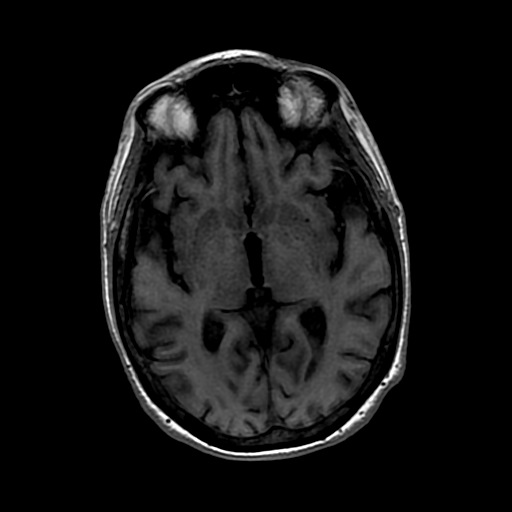

[Series 10: T2 · coronal · 5.0mm · 0.90mm/px · 3 of 27 slices shown (2 of 2)]
[im 1/27]
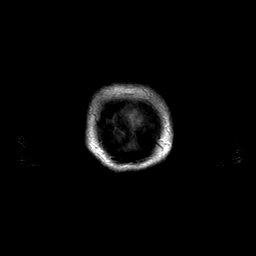
[im 14/27]
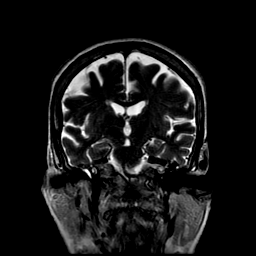
[im 27/27]
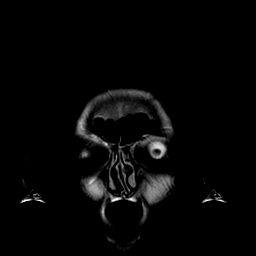

[Series 300: DWI · axial · 3.0mm · 1.09mm/px · z∈[-49,+94]mm · 5 of 48 slices shown (3 of 4)]
[im 1/48]
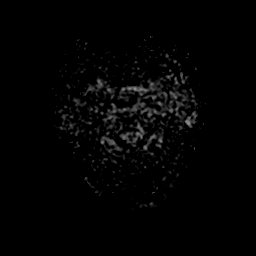
[im 12/48]
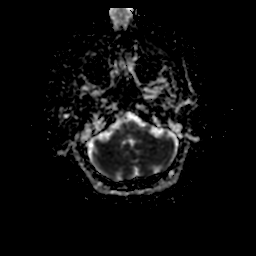
[im 24/48]
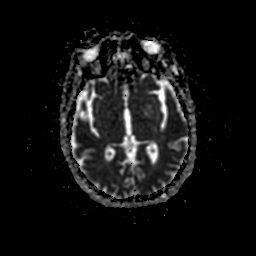
[im 36/48]
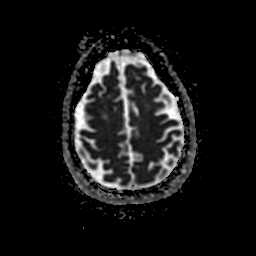
[im 48/48]
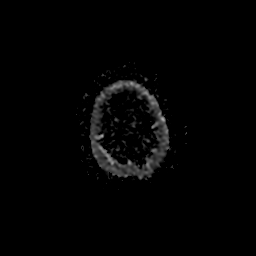

[Series 500: DWI · coronal · 3.0mm · 1.09mm/px · 5 of 56 slices shown (4 of 4)]
[im 1/56]
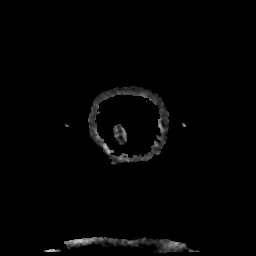
[im 14/56]
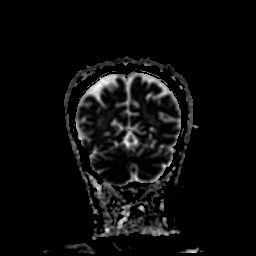
[im 28/56]
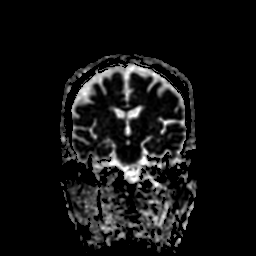
[im 42/56]
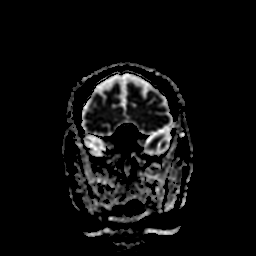
[im 56/56]
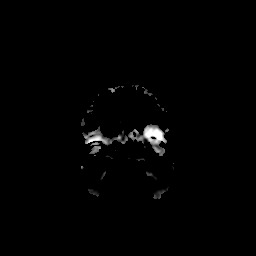

[46 of 48 positions shown; findings below may reference images not displayed]

FINDINGS: Brain: Negative for acute infarct. Generalized atrophy with mild
chronic microvascular ischemic changes in the white matter. Negative
for hemorrhage

6 mm extra-axial dural-based mass over the left frontal convexity
compatible with small meningioma. Slight progression since 5308.

Vascular: Normal arterial flow void.

Skull and upper cervical spine: Negative

Sinuses/Orbits: Paranasal sinuses clear. Normal orbit. Bilateral
mastoid effusion.

Other: None
IMPRESSION: Negative for acute infarct.

Mild chronic microvascular ischemic change in the white matter

6 mm left frontal convexity meningioma with mild increase in size
since [DATE].

## 2018-07-30 IMAGING — CT CT ANGIO NECK
2 of 9 series · 7 of 33 positions shown · IV contrast (iopamidol)
Comparison: Head CT 07/30/2018

CLINICAL DATA: Speech difficulty acute onset.

EXAM:
CT ANGIOGRAPHY HEAD AND NECK
TECHNIQUE: Multidetector CT imaging of the head and neck was performed using
the standard protocol during bolus administration of intravenous
contrast. Multiplanar CT image reconstructions and MIPs were
obtained to evaluate the vascular anatomy. Carotid stenosis
measurements (when applicable) are obtained utilizing NASCET
criteria, using the distal internal carotid diameter as the
denominator.
CONTRAST:  100mL OF27XK-3HL IOPAMIDOL (OF27XK-3HL) INJECTION 76%

[Series 5: ax cta head and neck thins · axial · 0.38mm/px · z∈[-261,-55]mm · 5 of 620 slices shown]
[im 104/620  soft-tissue]
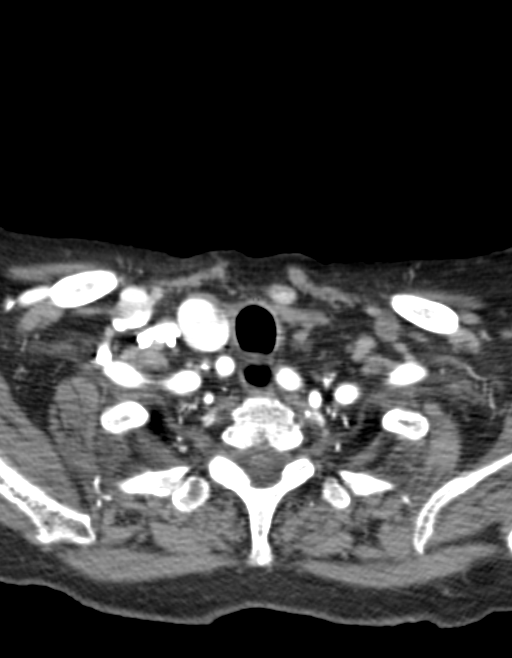
[im 207/620  bone]
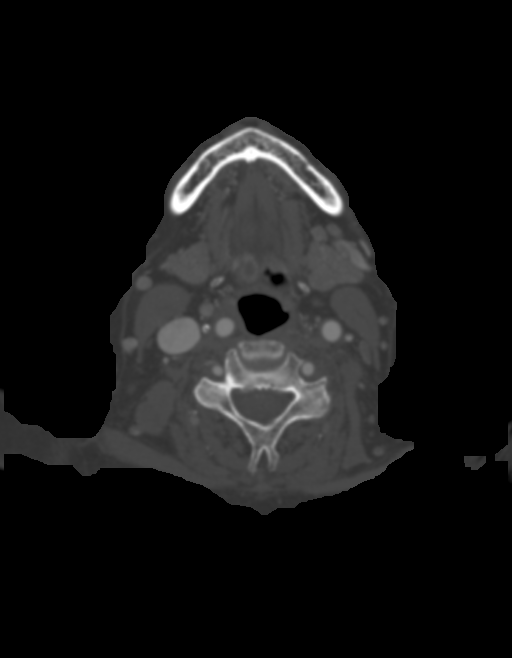
[im 310/620  soft-tissue]
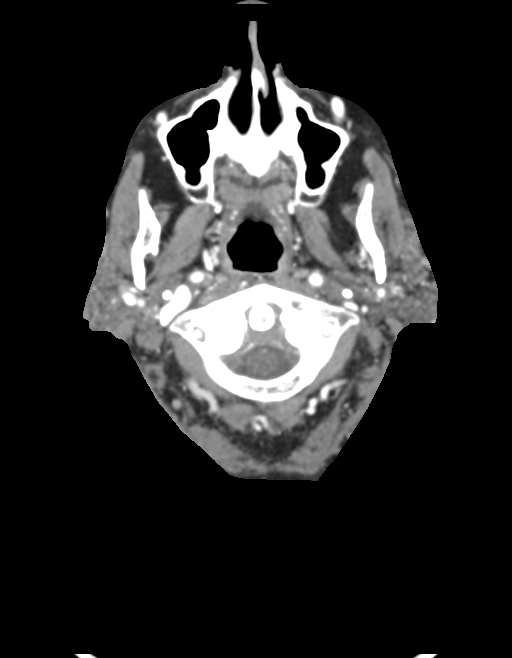
[im 413/620  bone]
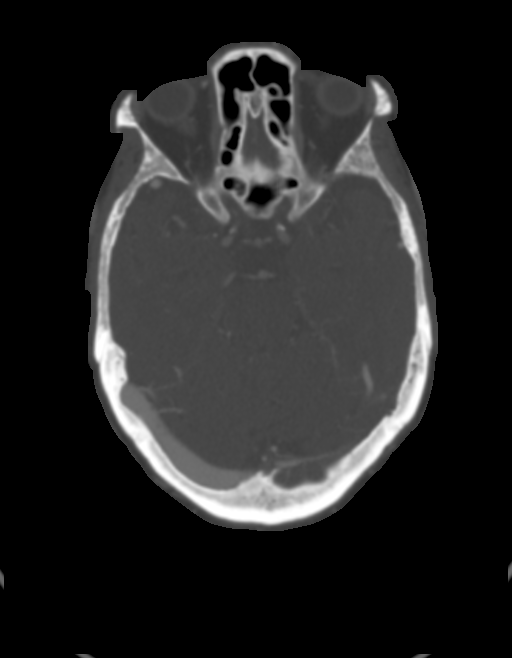
[im 516/620  soft-tissue]
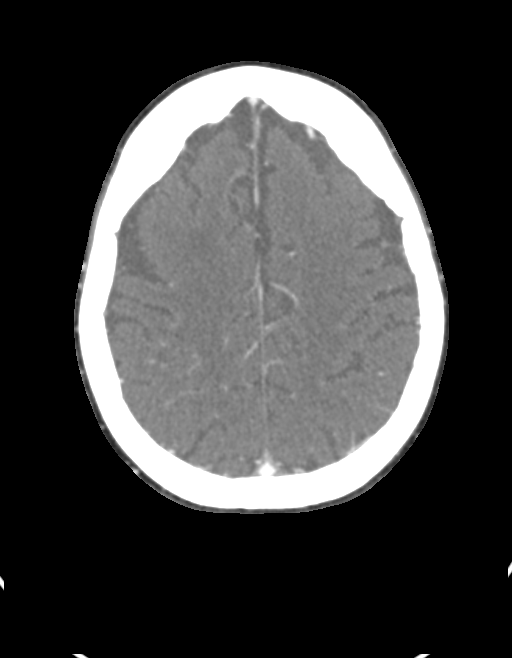

[Series 6: ax thin · axial · 0.38mm/px · z∈[-209,-106]mm · 2 of 310 slices shown]
[im 104/310  soft-tissue]
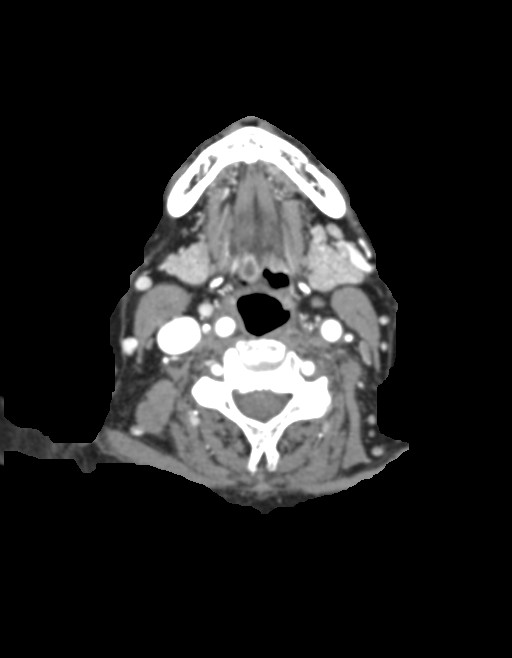
[im 207/310  soft-tissue]
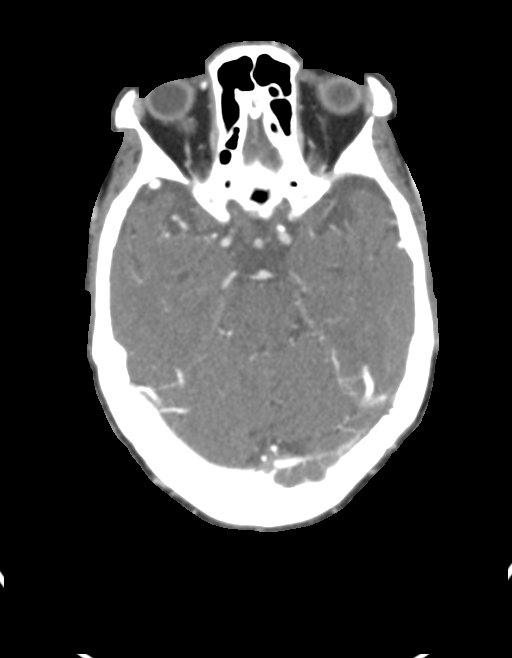

[7 of 33 positions shown; findings below may reference images not displayed]

FINDINGS: CTA NECK FINDINGS

SKELETON: There is no bony spinal canal stenosis. No lytic or
blastic lesion.

OTHER NECK: Normal pharynx, larynx and major salivary glands. No
cervical lymphadenopathy. Unremarkable thyroid gland.

UPPER CHEST: No pneumothorax or pleural effusion. No nodules or
masses.

AORTIC ARCH: There is mild calcific atherosclerosis of the aortic
arch. There is no aneurysm, dissection or hemodynamically
significant stenosis of the visualized ascending aorta and aortic
arch. Conventional 3 vessel aortic branching pattern. The visualized
proximal subclavian arteries are widely patent.

RIGHT CAROTID SYSTEM:

--Common carotid artery: Widely patent origin without common carotid
artery dissection or aneurysm.

--Internal carotid artery: No dissection, occlusion or aneurysm.
Mild atherosclerotic calcification at the carotid bifurcation
without hemodynamically significant stenosis.

--External carotid artery: No acute abnormality.

LEFT CAROTID SYSTEM:

--Common carotid artery: Widely patent origin without common carotid
artery dissection or aneurysm.

--Internal carotid artery: No dissection, occlusion or aneurysm.
Mild atherosclerotic calcification at the carotid bifurcation
without hemodynamically significant stenosis.

--External carotid artery: No acute abnormality.

VERTEBRAL ARTERIES: Left dominant configuration. The right origin is
normal. Mild narrowing of the left origin due to atherosclerotic
calcification. No dissection, occlusion or flow-limiting stenosis to
the vertebrobasilar confluence.

CTA HEAD FINDINGS

ANTERIOR CIRCULATION:

--Intracranial internal carotid arteries: Atherosclerotic
calcification of the internal carotid arteries at the skull base
without hemodynamically significant stenosis.

--Anterior cerebral arteries: Normal. Both A1 segments are present.
Patent anterior communicating artery.

--Middle cerebral arteries: Normal.

--Posterior communicating arteries: Absent bilaterally.

POSTERIOR CIRCULATION:

--Basilar artery: Normal.

--Posterior cerebral arteries: Normal.

--Superior cerebellar arteries: Normal.

--Inferior cerebellar arteries: Normal anterior and posterior
inferior cerebellar arteries.

VENOUS SINUSES: As permitted by contrast timing, patent.

ANATOMIC VARIANTS: Diminutive left transverse venous sinus is a
normal variant

DELAYED PHASE: No parenchymal contrast enhancement.

Review of the MIP images confirms the above findings.
IMPRESSION: 1. No emergent large vessel occlusion or high-grade stenosis.
2. Mild bilateral carotid bifurcation atherosclerosis without
hemodynamically significant stenosis.

## 2018-07-30 MED ORDER — ACETAMINOPHEN 325 MG PO TABS
650.0000 mg | ORAL_TABLET | Freq: Four times a day (QID) | ORAL | Status: DC | PRN
  Administered 2018-07-30 – 2018-07-31 (×2): 650 mg via ORAL
  Filled 2018-07-30: qty 2

## 2018-07-30 MED ORDER — IOPAMIDOL (ISOVUE-370) INJECTION 76%
100.0000 mL | Freq: Once | INTRAVENOUS | Status: AC | PRN
  Administered 2018-07-30: 100 mL via INTRAVENOUS

## 2018-07-30 MED ORDER — HYDRALAZINE HCL 25 MG PO TABS: 25.0000 mg | ORAL_TABLET | Freq: Two times a day (BID) | ORAL | Status: DC

## 2018-07-30 MED ORDER — IOPAMIDOL (ISOVUE-370) INJECTION 76%
INTRAVENOUS | Status: AC
  Filled 2018-07-30: qty 100

## 2018-07-30 MED ORDER — LEVOTHYROXINE SODIUM 112 MCG PO TABS: 168.0000 ug | ORAL_TABLET | ORAL | Status: DC

## 2018-07-30 MED ORDER — HYDRALAZINE HCL 50 MG PO TABS: 50.0000 mg | ORAL_TABLET | Freq: Three times a day (TID) | ORAL | Status: DC

## 2018-07-30 MED ORDER — APIXABAN 5 MG PO TABS
5.0000 mg | ORAL_TABLET | Freq: Two times a day (BID) | ORAL | Status: DC
  Administered 2018-07-30 – 2018-07-31 (×2): 5 mg via ORAL
  Filled 2018-07-30 (×2): qty 1

## 2018-07-30 MED ORDER — PANTOPRAZOLE SODIUM 40 MG PO TBEC: 40.0000 mg | DELAYED_RELEASE_TABLET | Freq: Every day | ORAL | Status: DC | PRN

## 2018-07-30 MED ORDER — ACETAMINOPHEN 650 MG RE SUPP: 650.0000 mg | Freq: Four times a day (QID) | RECTAL | Status: DC | PRN

## 2018-07-30 MED ORDER — NADOLOL 20 MG PO TABS
20.0000 mg | ORAL_TABLET | Freq: Every day | ORAL | Status: DC
  Filled 2018-07-30 (×2): qty 1

## 2018-07-30 MED ORDER — ONDANSETRON HCL 4 MG PO TABS: 4.0000 mg | ORAL_TABLET | Freq: Four times a day (QID) | ORAL | Status: DC | PRN

## 2018-07-30 MED ORDER — HYDRALAZINE HCL 50 MG PO TABS
50.0000 mg | ORAL_TABLET | Freq: Every day | ORAL | Status: DC
  Administered 2018-07-30: 50 mg via ORAL
  Filled 2018-07-30 (×2): qty 1

## 2018-07-30 MED ORDER — HYDRALAZINE HCL 20 MG/ML IJ SOLN
10.0000 mg | Freq: Once | INTRAMUSCULAR | Status: AC
  Administered 2018-07-30: 10 mg via INTRAVENOUS
  Filled 2018-07-30: qty 1

## 2018-07-30 MED ORDER — HYDRALAZINE HCL 25 MG PO TABS
25.0000 mg | ORAL_TABLET | Freq: Every day | ORAL | Status: DC
  Administered 2018-07-31: 25 mg via ORAL
  Filled 2018-07-30 (×2): qty 1

## 2018-07-30 MED ORDER — HYDRALAZINE HCL 25 MG PO TABS: 25.0000 mg | ORAL_TABLET | Freq: Three times a day (TID) | ORAL | Status: DC

## 2018-07-30 MED ORDER — ONDANSETRON HCL 4 MG/2ML IJ SOLN: 4.0000 mg | Freq: Four times a day (QID) | INTRAMUSCULAR | Status: DC | PRN

## 2018-07-30 MED ORDER — SODIUM CHLORIDE (PF) 0.9 % IJ SOLN
INTRAMUSCULAR | Status: AC
  Filled 2018-07-30: qty 50

## 2018-07-30 MED ORDER — STROKE: EARLY STAGES OF RECOVERY BOOK: Freq: Once | Status: DC

## 2018-07-30 MED ORDER — STROKE: EARLY STAGES OF RECOVERY BOOK
Freq: Once | Status: DC
  Filled 2018-07-30: qty 1

## 2018-07-30 MED ORDER — LEVOTHYROXINE SODIUM 112 MCG PO TABS
112.0000 ug | ORAL_TABLET | ORAL | Status: DC
  Administered 2018-07-31: 112 ug via ORAL
  Filled 2018-07-30: qty 1

## 2018-07-30 MED ORDER — IOPAMIDOL (ISOVUE-300) INJECTION 61%
INTRAVENOUS | Status: AC
  Filled 2018-07-30: qty 100

## 2018-07-30 MED ORDER — LORAZEPAM 2 MG/ML IJ SOLN
0.5000 mg | Freq: Once | INTRAMUSCULAR | Status: AC | PRN
  Administered 2018-07-30: 0.5 mg via INTRAVENOUS
  Filled 2018-07-30: qty 1

## 2018-07-30 NOTE — Progress Notes (Signed)
Received report that ICU charge nurse notified of need for NIH scale assessment. Day shift charge to report to night shift charge. Will call if have not arrived by 8pm. Britt Boozer

## 2018-07-30 NOTE — ED Notes (Signed)
Admission work completed by admission RN.

## 2018-07-30 NOTE — ED Notes (Signed)
MD made aware of BP. No additional orders placed with understanding that the she is at the desired BP at this time.

## 2018-07-30 NOTE — ED Provider Notes (Signed)
[facility] DEPT Provider Note   CSN: [id number] Arrival date & time: 07/30/18  1004     History   Chief Complaint Chief Complaint  Patient presents with  . Altered Mental Status    HPI Heidi Adams is a 83 y.o. female.  83 year old female with prior medical history as detailed below presents for evaluation of transient episode of "difficulty finding her words."  Patient reports that she was in her normal state of health this morning until approximately 9 AM.  At that time she was on the phone with an insurance company trying to make a payment.  She was unable to answer questions from the insurance agent.  She was able to provide specific phone numbers or other account information that was required.  She did not have slurred speech.  This episode was witnessed by her husband who is at bedside.  She had no other focal neurologic symptoms at that time.  Her difficulty finding the correct words lasted approximately 30 minutes.  Upon arrival to the ED she feels significantly improved.  She again denies focal weakness, visual change, slurred speech, or other complaint.  She denies difficulty with her gait.  She denies prior history of CVA or TIA.  She denies prior work-up for same.  She does report a longstanding history of labile hypertension.  She does report taking her blood pressure medication earlier this morning.  She is also on Eliquis for reported prior history of atrial fibrillation.  The history is provided by the patient, medical records and a relative.  Neurologic Problem  This is a new problem. The current episode started 1 to 2 hours ago. The problem occurs rarely. The problem has been resolved. Pertinent negatives include no chest pain, no abdominal pain, no headaches and no shortness of breath. Nothing aggravates the symptoms. Nothing relieves the symptoms. She has tried nothing for the symptoms.    Past Medical History:  Diagnosis Date  .  Allergy   . Anemia    hx of anemia  . Arthritis   . Breast cancer ([place])    Left breast  . Cancer ([place])    skin ca nose  . Dysrhythmia    went into Atrial Fibrillation after last surgery, HAS PALPITATIONS  . History of skin cancer   . Hypertension   . Hypothyroidism   . PVC (premature ventricular contraction)   . Thyroiditis 1973    Patient Active Problem List   Diagnosis Date Noted  . Malignant neoplasm of upper-inner quadrant of left breast in female, estrogen receptor positive ([place]) 06/20/2017  . S/P closed reduction of dislocated total hip prosthesis 02/03/2017  . S/P left THA, AA 01/10/2017  . Overweight (BMI 25.0-29.9) 05/11/2016  . S/P right THA, AA 05/10/2016  . Right hip pain 03/16/2016  . Pes planus 03/16/2016  . Abnormality of gait 03/16/2016  . PAC (premature atrial contraction) 02/24/2016  . PVC (premature ventricular contraction) 02/24/2016  . Palpitations 01/27/2016  . Benign paroxysmal positional vertigo of right ear 12/15/2015  . Pharyngoesophageal dysphagia 12/15/2015  . Hypothyroidism 11/26/2015  . Essential hypertension 11/26/2015  . S/P left TKA 09/28/2015  . Lichen sclerosus 76/72/0947    Past Surgical History:  Procedure Laterality Date  . ABDOMINAL HYSTERECTOMY  90   TAH BSO  . APPENDECTOMY    . BREAST LUMPECTOMY Left 05/30/2017  . BREAST LUMPECTOMY WITH RADIOACTIVE SEED LOCALIZATION Left 05/30/2017   Procedure: LEFT BREAST LUMPECTOMY WITH RADIOACTIVE SEED LOCALIZATION;  Surgeon: Excell Seltzer,  MD;  Location: [facility];  Service: General;  Laterality: Left;  . CHOLECYSTECTOMY     dr Georgia Lopes  . ELBOW SURGERY    . EYE SURGERY     "on my eyelids" years ago  . KNEE ARTHROSCOPY     left  . THYROIDECTOMY  1973  . TONSILLECTOMY    . TOTAL HIP ARTHROPLASTY Right 05/10/2016   Procedure: TOTAL HIP ARTHROPLASTY ANTERIOR APPROACH;  Surgeon: Paralee Cancel, MD;  Location: [facility];  Service: Orthopedics;  Laterality: Right;  . TOTAL  HIP ARTHROPLASTY Left 01/10/2017   Procedure: LEFT TOTAL HIP ARTHROPLASTY ANTERIOR APPROACH;  Surgeon: Paralee Cancel, MD;  Location: [facility];  Service: Orthopedics;  Laterality: Left;  . TOTAL KNEE ARTHROPLASTY Left 09/28/2015   Procedure: LEFT TOTAL KNEE ARTHROPLASTY;  Surgeon: Paralee Cancel, MD;  Location: [facility];  Service: Orthopedics;  Laterality: Left;  . TOTAL KNEE ARTHROPLASTY Right 08/09/2016   Procedure: RIGHT TOTAL KNEE ARTHROPLASTY;  Surgeon: Paralee Cancel, MD;  Location: [facility];  Service: Orthopedics;  Laterality: Right;  Adductor Block  . Total Left hip arthroplasty     01/10/17 Dr. Alvan Dame     OB History    Gravida  2   Para  2   Term      Preterm      AB      Living  2     SAB      TAB      Ectopic      Multiple      Live Births               Home Medications    Prior to Admission medications   Medication Sig Start Date End Date Taking? Authorizing Provider  acetaminophen (TYLENOL) 500 MG tablet Take 1,000 mg once by mouth.    [provider]  levothyroxine (SYNTHROID, LEVOTHROID) 112 MCG tablet Take 112 mcg by mouth daily before breakfast.    [provider]  nadolol (CORGARD) 20 MG tablet Take 20 mg by mouth daily.    [provider]  NONFORMULARY OR COMPOUNDED ITEM Triam 0.1% CR-SSD 1% 1:1( triamcinolone cream 0.1cream /SSD Silvaden 1% cream ) apply to affected area twice daily 09/14/17   Fontaine, Belinda Block, MD  pantoprazole (PROTONIX) 40 MG tablet Take 40 mg by mouth daily as needed (indigestion).  01/20/16   [provider]    Family History Family History  Problem Relation Age of Onset  . Diabetes Father   . Hypertension Father   . Stroke Father   . Breast cancer Cousin     Social History Social History   Tobacco Use  . Smoking status: Former Smoker    Last attempt to quit: 07/26/1987    Years since quitting: 31.0  . Smokeless tobacco: Never Used  Substance Use Topics  . Alcohol use: No    Alcohol/week:  0.0 standard drinks  . Drug use: No     Allergies   Codeine; Epinephrine; and Tenormin [atenolol]   Review of Systems Review of Systems  Respiratory: Negative for shortness of breath.   Cardiovascular: Negative for chest pain.  Gastrointestinal: Negative for abdominal pain.  Neurological: Positive for speech difficulty. Negative for headaches.  All other systems reviewed and are negative.    Physical Exam Updated Vital Signs BP (!) 210/88 (BP Location: Right Arm)   Pulse 85   Temp 98 F (36.7 C) (Oral)   Resp 16   Ht 5\' 4"  (1.626 m)  Wt 66.7 kg   LMP 07/25/1988 Comment: full   SpO2 100%   BMI 25.23 kg/m   Physical Exam Vitals signs and nursing note reviewed.  Constitutional:      General: She is not in acute distress.    Appearance: Normal appearance. She is well-developed.  HENT:     Head: Normocephalic and atraumatic.  Eyes:     Conjunctiva/sclera: Conjunctivae normal.     Pupils: Pupils are equal, round, and reactive to light.  Neck:     Musculoskeletal: Normal range of motion and neck supple.  Cardiovascular:     Rate and Rhythm: Normal rate and regular rhythm.     Heart sounds: Normal heart sounds.  Pulmonary:     Effort: Pulmonary effort is normal. No respiratory distress.     Breath sounds: Normal breath sounds.  Abdominal:     General: There is no distension.     Palpations: Abdomen is soft.     Tenderness: There is no abdominal tenderness.  Musculoskeletal: Normal range of motion.        General: No deformity.  Skin:    General: Skin is warm and dry.  Neurological:     General: No focal deficit present.     Mental Status: She is alert and oriented to person, place, and time. Mental status is at baseline.     Cranial Nerves: No cranial nerve deficit.     Sensory: No sensory deficit.     Motor: No weakness.     Coordination: Coordination normal.     Gait: Gait normal.     Comments: AOX3 Normal speech No facial droop   VAN negative        ED Treatments / Results  Labs (all labs ordered are listed, but only abnormal results are displayed) Labs Reviewed  COMPREHENSIVE METABOLIC PANEL - Abnormal; Notable for the following components:      Result Value   Total Protein 6.3 (*)    AST 12 (*)    All other components within normal limits  URINALYSIS, ROUTINE W REFLEX MICROSCOPIC - Abnormal; Notable for the following components:   Color, Urine STRAW (*)    All other components within normal limits  CBC WITH DIFFERENTIAL/PLATELET  TROPONIN I  I-STAT CHEM 8, ED  CBG MONITORING, ED    EKG EKG Interpretation  Date/Time:  Monday July 30 2018 10:25:23 EST Ventricular Rate:  84 PR Interval:    QRS Duration: 97 QT Interval:  358 QTC Calculation: 424 R Axis:   -53 Text Interpretation:  Sinus rhythm Left anterior fascicular block Minimal ST depression, lateral leads Confirmed by Dene Gentry [phone]) on 07/30/2018 10:28:24 AM   Radiology Ct Head Wo Contrast  Result Date: 07/30/2018 CLINICAL DATA:  Confusion EXAM: CT HEAD WITHOUT CONTRAST TECHNIQUE: Contiguous axial images were obtained from the base of the skull through the vertex without intravenous contrast. COMPARISON:  06/21/2017 FINDINGS: Brain: No evidence of acute infarction, hemorrhage, hydrocephalus, extra-axial collection or mass lesion/mass effect. Chronic atrophic and white matter ischemic changes are noted. Vascular: No hyperdense vessel or unexpected calcification. Skull: Normal. Negative for fracture or focal lesion. Sinuses/Orbits: No acute finding. Other: None. IMPRESSION: Chronic atrophic and ischemic changes without acute abnormality. Electronically Signed   By: Inez Catalina M.D.   On: 07/30/2018 10:58    Procedures Procedures (including critical care time) CRITICAL CARE Performed by: Valarie Merino   Total critical care time: 30 minutes  Critical care time was exclusive of separately billable procedures and  treating other patients.  Critical  care was necessary to treat or prevent imminent or life-threatening deterioration.  Critical care was time spent personally by me on the following activities: development of treatment plan with patient and/or surrogate as well as nursing, discussions with consultants, evaluation of patient's response to treatment, examination of patient, obtaining history from patient or surrogate, ordering and performing treatments and interventions, ordering and review of laboratory studies, ordering and review of radiographic studies, pulse oximetry and re-evaluation of patient's condition.   Medications Ordered in ED Medications  hydrALAZINE (APRESOLINE) injection 10 mg (10 mg Intravenous Given 07/30/18 1100)     Initial Impression / Assessment and Plan / ED Course  I have reviewed the triage vital signs and the nursing notes.  Pertinent labs & imaging results that were available during my care of the patient were reviewed by me and considered in my medical decision making (see chart for details).     MDM  Screen complete  Patient is presenting for evaluation of transient episode of difficulty with her speech.  Symptoms are concerning for possible TIA.  Patient is blood pressure is noted to be elevated upon her initial exam.  Initial screening labs and head CT are without significant abnormality.  Patient's blood pressure is improving with administration of hydralazine.  Case discussed with the neuro hospitalist -Dr. Lorraine Lax - he agrees with the ED plan of care.  He requests that the patient be admitted to the hospitalist service and transferred to [facility] for further work-up and treatment.  He suggests hydralazine for control of BP.  Hospitalist service is contacted and will evaluate for admission. Final Clinical Impressions(s) / ED Diagnoses   Final diagnoses:  TIA (transient ischemic attack)  Hypertension, unspecified type    ED Discharge Orders    None       Valarie Merino,  MD 07/30/18 1137

## 2018-07-30 NOTE — Progress Notes (Signed)
ED TO INPATIENT HANDOFF REPORT  Name/Age/Gender Heidi Adams 83 y.o. female  Code Status    Code Status Orders  (From admission, onward)         Start     Ordered   07/30/18 1312  Full code  Continuous     07/30/18 1312        Code Status History    Date Active Date Inactive Code Status Order ID Comments User Context   02/03/2017 1739 02/04/2017 1810 Full Code [id number]  Nicholes Stairs, MD Inpatient   01/10/2017 1239 01/11/2017 1850 Full Code [id number]  Norman Herrlich Inpatient   08/09/2016 1743 08/11/2016 1708 Full Code [id number]  Norman Herrlich Inpatient   05/10/2016 1806 05/12/2016 1359 Full Code [id number]  Danae Orleans, PA-C Inpatient    Advance Directive Documentation     Most Recent Value  Type of Advance Directive  Healthcare Power of Attorney, Living will  Pre-existing out of facility DNR order (yellow form or pink MOST form)  -  "MOST" Form in Place?  -      Home/SNF/Other Home  Chief Complaint Hard to Communicate  Level of Care/Admitting Diagnosis ED Disposition    ED Disposition Condition [place]: [facility] [100102]  Level of Care: Telemetry [5]  Admit to tele based on following criteria: Other see comments  Comments: tia  Diagnosis: TIA (transient ischemic attack) [202542]  Admitting Physician: Edwin Dada [[id number]]  Attending Physician: Edwin Dada [[id number]]  PT Class (Do Not Modify): Observation [104]  PT Acc Code (Do Not Modify): Observation [10022]       Medical History Past Medical History:  Diagnosis Date  . Allergy   . Anemia    hx of anemia  . Arthritis   . Breast cancer ([place])    Left breast  . Cancer ([place])    skin ca nose  . Dysrhythmia    went into Atrial Fibrillation after last surgery, HAS PALPITATIONS  . History of skin cancer   . Hypertension   . Hypothyroidism   . PVC (premature ventricular contraction)   . Thyroiditis 1973     Allergies Allergies  Allergen Reactions  . Codeine Nausea Only  . Epinephrine     Tachcardyia, chest pains tremors  . Tenormin [Atenolol] Rash    IV Location/Drains/Wounds Patient Lines/Drains/Airways Status   Active Line/Drains/Airways    Name:   Placement date:   Placement time:   Site:   Days:   Peripheral IV 07/30/18 Right Antecubital   07/30/18    1036    Antecubital   less than 1   Incision (Closed) 01/10/17 Hip Left   01/10/17    0925     566   Incision (Closed) 05/30/17 Breast Left   05/30/17    1349     426          Labs/Imaging Results for orders placed or performed during the hospital encounter of 07/30/18 (from the past 48 hour(s))  Comprehensive metabolic panel     Status: Abnormal   Collection Time: 07/30/18 10:39 AM  Result Value Ref Range   Sodium 140 135 - 145 mmol/L   Potassium 3.7 3.5 - 5.1 mmol/L   Chloride 108 98 - 111 mmol/L   CO2 24 22 - 32 mmol/L   Glucose, Bld 94 70 - 99 mg/dL   BUN 16 8 - 23 mg/dL   Creatinine, Ser 0.69 0.44 - 1.00 mg/dL  Calcium 8.9 8.9 - 10.3 mg/dL   Total Protein 6.3 (L) 6.5 - 8.1 g/dL   Albumin 3.5 3.5 - 5.0 g/dL   AST 12 (L) 15 - 41 U/L   ALT 10 0 - 44 U/L   Alkaline Phosphatase 75 38 - 126 U/L   Total Bilirubin 0.7 0.3 - 1.2 mg/dL   GFR calc non Af Amer >60 >60 mL/min   GFR calc Af Amer >60 >60 mL/min   Anion gap 8 5 - 15    Comment: Performed at [facility], [place] [address], [place], [place] [address]  CBC with Differential     Status: None   Collection Time: 07/30/18 10:39 AM  Result Value Ref Range   WBC 7.1 4.0 - 10.5 K/uL   RBC 4.29 3.87 - 5.11 MIL/uL   Hemoglobin 12.7 12.0 - 15.0 g/dL   HCT 40.6 36.0 - 46.0 %   MCV 94.6 80.0 - 100.0 fL   MCH 29.6 26.0 - 34.0 pg   MCHC 31.3 30.0 - 36.0 g/dL   RDW 13.6 11.5 - 15.5 %   Platelets 254 150 - 400 K/uL   nRBC 0.0 0.0 - 0.2 %   Neutrophils Relative % 75 %   Neutro Abs 5.3 1.7 - 7.7 K/uL   Lymphocytes Relative 17 %   Lymphs Abs 1.2 0.7 -  4.0 K/uL   Monocytes Relative 6 %   Monocytes Absolute 0.4 0.1 - 1.0 K/uL   Eosinophils Relative 2 %   Eosinophils Absolute 0.2 0.0 - 0.5 K/uL   Basophils Relative 0 %   Basophils Absolute 0.0 0.0 - 0.1 K/uL   Immature Granulocytes 0 %   Abs Immature Granulocytes 0.03 0.00 - 0.07 K/uL    Comment: Performed at [facility], [place] [address], [place], [place] [address]  Troponin I - Once     Status: None   Collection Time: 07/30/18 10:39 AM  Result Value Ref Range   Troponin I <0.03 <0.03 ng/mL    Comment: Performed at [facility], [place] [address] [place], [place] [address]  I-stat Chem 8, ED     Status: None   Collection Time: 07/30/18 10:44 AM  Result Value Ref Range   Sodium 138 135 - 145 mmol/L   Potassium 3.7 3.5 - 5.1 mmol/L   Chloride 107 98 - 111 mmol/L   BUN 14 8 - 23 mg/dL   Creatinine, Ser 0.60 0.44 - 1.00 mg/dL   Glucose, Bld 95 70 - 99 mg/dL   Calcium, Ion 1.20 1.15 - 1.40 mmol/L   TCO2 24 22 - 32 mmol/L   Hemoglobin 13.3 12.0 - 15.0 g/dL   HCT 39.0 36.0 - 46.0 %  CBG monitoring, ED     Status: None   Collection Time: 07/30/18 10:55 AM  Result Value Ref Range   Glucose-Capillary 89 70 - 99 mg/dL  Urinalysis, Routine w reflex microscopic     Status: Abnormal   Collection Time: 07/30/18 11:04 AM  Result Value Ref Range   Color, Urine STRAW (A) YELLOW   APPearance CLEAR CLEAR   Specific Gravity, Urine 1.006 1.005 - 1.030   pH 7.0 5.0 - 8.0   Glucose, UA NEGATIVE NEGATIVE mg/dL   Hgb urine dipstick NEGATIVE NEGATIVE   Bilirubin Urine NEGATIVE NEGATIVE   Ketones, ur NEGATIVE NEGATIVE mg/dL   Protein, ur NEGATIVE NEGATIVE mg/dL   Nitrite NEGATIVE NEGATIVE   Leukocytes, UA NEGATIVE NEGATIVE    Comment: Performed  at [facility], [place] [address] [place], [place] [address]   Ct Head Wo Contrast  Result Date: 07/30/2018 CLINICAL DATA:  Confusion EXAM: CT HEAD WITHOUT CONTRAST TECHNIQUE: Contiguous axial images  were obtained from the base of the skull through the vertex without intravenous contrast. COMPARISON:  06/21/2017 FINDINGS: Brain: No evidence of acute infarction, hemorrhage, hydrocephalus, extra-axial collection or mass lesion/mass effect. Chronic atrophic and white matter ischemic changes are noted. Vascular: No hyperdense vessel or unexpected calcification. Skull: Normal. Negative for fracture or focal lesion. Sinuses/Orbits: No acute finding. Other: None. IMPRESSION: Chronic atrophic and ischemic changes without acute abnormality. Electronically Signed   By: Inez Catalina M.D.   On: 07/30/2018 10:58   Mr Brain Wo Contrast  Result Date: 07/30/2018 CLINICAL DATA:  Focal neuro deficit less than 6 hours.  Confusion EXAM: MRI HEAD WITHOUT CONTRAST TECHNIQUE: Multiplanar, multiecho pulse sequences of the brain and surrounding structures were obtained without intravenous contrast. COMPARISON:  CT 07/30/2018, MRI 12/26/2015 FINDINGS: Brain: Negative for acute infarct. Generalized atrophy with mild chronic microvascular ischemic changes in the white matter. Negative for hemorrhage 6 mm extra-axial dural-based mass over the left frontal convexity compatible with small meningioma. Slight progression since 2017. Vascular: Normal arterial flow void. Skull and upper cervical spine: Negative Sinuses/Orbits: Paranasal sinuses clear. Normal orbit. Bilateral mastoid effusion. Other: None IMPRESSION: Negative for acute infarct. Mild chronic microvascular ischemic change in the white matter 6 mm left frontal convexity meningioma with mild increase in size since 2017. Electronically Signed   By: Franchot Gallo M.D.   On: 07/30/2018 14:10    Pending Labs Unresulted Labs (From admission, onward)    Start     Ordered   07/31/18 0500  Hemoglobin A1c  Tomorrow morning,   R     07/30/18 1147   07/31/18 0500  Lipid panel  Tomorrow morning,   R    Comments:  Fasting    07/30/18 1147   07/31/18 0630  Basic metabolic panel   Tomorrow morning,   R     07/30/18 1312   07/31/18 0500  CBC  Tomorrow morning,   R     07/30/18 1312          Vitals/Pain Today's Vitals   07/30/18 1012 07/30/18 1029 07/30/18 1100 07/30/18 1102  BP: (!) 215/105  (!) 226/92 (!) 210/88  Pulse: 87   85  Resp: 16     Temp: 98 F (36.7 C)     TempSrc: Oral     SpO2: 99%   100%  Weight:  66.7 kg    Height:  5\' 4"  (1.626 m)      Isolation Precautions No active isolations  Medications Medications   stroke: mapping our early stages of recovery book (has no administration in time range)  nadolol (CORGARD) tablet 20 mg (has no administration in time range)  levothyroxine (SYNTHROID, LEVOTHROID) tablet 112 mcg (has no administration in time range)  pantoprazole (PROTONIX) EC tablet 40 mg (has no administration in time range)  apixaban (ELIQUIS) tablet 5 mg (has no administration in time range)  acetaminophen (TYLENOL) tablet 650 mg (has no administration in time range)    Or  acetaminophen (TYLENOL) suppository 650 mg (has no administration in time range)  ondansetron (ZOFRAN) tablet 4 mg (has no administration in time range)    Or  ondansetron (ZOFRAN) injection 4 mg (has no administration in time range)  hydrALAZINE (APRESOLINE) tablet 25 mg (has no administration in time range)  sodium  chloride (PF) 0.9 % injection (has no administration in time range)  iopamidol (ISOVUE-370) 76 % injection (has no administration in time range)  iopamidol (ISOVUE-370) 76 % injection (has no administration in time range)  sodium chloride (PF) 0.9 % injection (has no administration in time range)  hydrALAZINE (APRESOLINE) injection 10 mg (10 mg Intravenous Given 07/30/18 1100)  LORazepam (ATIVAN) injection 0.5 mg (0.5 mg Intravenous Given 07/30/18 1326)  iopamidol (ISOVUE-370) 76 % injection 100 mL (100 mLs Intravenous Contrast Given 07/30/18 1631)    Mobility walks with person assist

## 2018-07-30 NOTE — Progress Notes (Signed)
  Echocardiogram 2D Echocardiogram has been performed.  Heidi Adams 07/30/2018, 3:59 PM

## 2018-07-30 NOTE — Progress Notes (Signed)
PT Cancellation Note  Patient Details Name: Heidi Adams MRN: [id number] DOB: 1936/05/11   Cancelled Treatment:    Reason Eval/Treat Not Completed: Other (comment)(per chart, pt is transferring to [facility]. PT to evaluate there. )   Philomena Doheny PT 07/30/2018  Acute Rehabilitation Services Pager [phone] Office [phone]

## 2018-07-30 NOTE — ED Notes (Signed)
Report called to receiving nurse. MD in room with the patient at this time.

## 2018-07-30 NOTE — Progress Notes (Signed)
Prior to floor admission, hospitalist contacted regarding orders NIH scale & VS every 2 hours for 12 hours. Discussed level of care, Provider stated that order would be changed to NIH scale & VS every 4 hours on arrival to unit.

## 2018-07-30 NOTE — Consult Note (Signed)
Stroke Neurology Consultation Note  Consult Requested by: Dr. Loleta Books  Reason for Consult: TIA  Consult Date: 07/30/18  The history was obtained from the pt and husband.  During history and examination, all items were able to obtain unless otherwise noted.  History of Present Illness:  Heidi Adams is a 83 y.o. Caucasian female with PMH of left breast cancer, post op afib on eliquis, HTN admitted for episode of confusion, difficulty speaking and getting words out at this morning 9am. She was on the phone with her insurance company discussing about payment but then she was having difficulty answering questions. Episode resolved in 82min. Pt was brought to [facility] ER for further evaluation. In ER her BP was in 200s up to 226/92.   She has hx of afib after hip surgery in 2017 and she was on eliquis, nadolol for that. She has been following with her cardiologist and continued on eliquis. She stated compliance with eliquis.   She has hx of supine HTN and orthostatic hypotension. She is followed with Dr. [name]. She was on hydralazine and nadolol, but her BP still high at night and trending down during the day. She is on TED hose.   She also had neck pain and left elbow arthritis.   LSN: 9am today tPA Given: No: symptoms resolved  Past Medical History:  Diagnosis Date  . Allergy   . Anemia    hx of anemia  . Arthritis   . Breast cancer ([place])    Left breast  . Cancer ([place])    skin ca nose  . Dysrhythmia    went into Atrial Fibrillation after last surgery, HAS PALPITATIONS  . History of skin cancer   . Hypertension   . Hypothyroidism   . PVC (premature ventricular contraction)   . Thyroiditis 1973    Past Surgical History:  Procedure Laterality Date  . ABDOMINAL HYSTERECTOMY  89   TAH BSO  . APPENDECTOMY    . BREAST LUMPECTOMY Left 05/30/2017  . BREAST LUMPECTOMY WITH RADIOACTIVE SEED LOCALIZATION Left 05/30/2017   Procedure: LEFT BREAST LUMPECTOMY WITH RADIOACTIVE SEED LOCALIZATION;   Surgeon: Excell Seltzer, MD;  Location: [facility];  Service: General;  Laterality: Left;  . CHOLECYSTECTOMY     dr Georgia Lopes  . ELBOW SURGERY    . EYE SURGERY     "on my eyelids" years ago  . KNEE ARTHROSCOPY     left  . THYROIDECTOMY  1973  . TONSILLECTOMY    . TOTAL HIP ARTHROPLASTY Right 05/10/2016   Procedure: TOTAL HIP ARTHROPLASTY ANTERIOR APPROACH;  Surgeon: Paralee Cancel, MD;  Location: [facility];  Service: Orthopedics;  Laterality: Right;  . TOTAL HIP ARTHROPLASTY Left 01/10/2017   Procedure: LEFT TOTAL HIP ARTHROPLASTY ANTERIOR APPROACH;  Surgeon: Paralee Cancel, MD;  Location: [facility];  Service: Orthopedics;  Laterality: Left;  . TOTAL KNEE ARTHROPLASTY Left 09/28/2015   Procedure: LEFT TOTAL KNEE ARTHROPLASTY;  Surgeon: Paralee Cancel, MD;  Location: [facility];  Service: Orthopedics;  Laterality: Left;  . TOTAL KNEE ARTHROPLASTY Right 08/09/2016   Procedure: RIGHT TOTAL KNEE ARTHROPLASTY;  Surgeon: Paralee Cancel, MD;  Location: [facility];  Service: Orthopedics;  Laterality: Right;  Adductor Block  . Total Left hip arthroplasty     01/10/17 Dr. Alvan Dame    Family History  Problem Relation Age of Onset  . Diabetes Father   . Hypertension Father   . Stroke Father   . Breast cancer Cousin     Social History:  reports that she quit smoking about 31 years ago. She has never used smokeless tobacco. She reports that she does not drink alcohol or use drugs.  Allergies:  Allergies  Allergen Reactions  . Codeine Nausea Only  . Epinephrine     Tachcardyia, chest pains tremors  . Tenormin [Atenolol] Rash    No current facility-administered medications on file prior to encounter.    Current Outpatient Medications on File Prior to Encounter  Medication Sig Dispense Refill  . acetaminophen (TYLENOL) 500 MG tablet Take 1,000 mg by mouth 3 (three) times daily as needed for moderate pain.     Marland Kitchen apixaban (ELIQUIS) 5 MG TABS tablet Take 5 mg by mouth 2 (two) times daily.    .  hydrALAZINE (APRESOLINE) 25 MG tablet Take 25 mg by mouth 2 (two) times daily.     Marland Kitchen levothyroxine (SYNTHROID, LEVOTHROID) 112 MCG tablet Take 112 mcg by mouth daily before breakfast.    . Misc Natural Products (GLUCOSAMINE CHOND MSM FORMULA PO) Take 2 tablets by mouth daily.    . nadolol (CORGARD) 20 MG tablet Take 20 mg by mouth daily.    . pantoprazole (PROTONIX) 40 MG tablet Take 40 mg by mouth daily as needed (indigestion).       Review of Systems: A full ROS was attempted today and was able to be performed.  Systems assessed include - Constitutional, Eyes, HENT, Respiratory, Cardiovascular, Gastrointestinal, Genitourinary, Integument/breast, Hematologic/lymphatic, Musculoskeletal, Neurological, Behavioral/Psych, Endocrine, Allergic/Immunologic - with pertinent responses as per HPI.  Physical Examination: Temp:  [98 F (36.7 C)] 98 F (36.7 C) (01/06 1012) Pulse Rate:  [85-87] 85 (01/06 1102) Resp:  [16] 16 (01/06 1012) BP: (210-226)/(88-105) 210/88 (01/06 1102) SpO2:  [99 %-100 %] 100 % (01/06 1102) Weight:  [66.7 kg] 66.7 kg (01/06 1029)  General - well nourished, well developed, in no apparent distress.    Ophthalmologic - fundi not visualized due to noncooperation.    Cardiovascular - regular rate and rhythm   Mental Status -  Level of arousal and orientation to time, place, and person were intact. Language including expression, naming, repetition, comprehension, reading, and writing was assessed and found intact. Fund of Knowledge was assessed and was intact.  Cranial Nerves II - XII - II - Vision intact OU. III, IV, VI - Extraocular movements intact. V - Facial sensation intact bilaterally. VII - Facial movement intact bilaterally. VIII - Hearing & vestibular intact bilaterally. X - Palate elevates symmetrically. XI - Chin turning & shoulder shrug intact bilaterally. XII - Tongue protrusion intact.  Motor Strength - The patient's strength was normal in all  extremities and pronator drift was absent.   Motor Tone & Bulk - Muscle tone was assessed at the neck and appendages and was normal.  Bulk was normal and fasciculations were absent.   Reflexes - The patient's reflexes were normal in all extremities and she had no pathological reflexes.  Sensory - Light touch, temperature/pinprick were assessed and were normal.    Coordination - The patient had normal movements in the hands and feet with no ataxia or dysmetria.  Tremor was absent.  Gait and Station - deferred  Data Reviewed: Ct Head Wo Contrast  Result Date: 07/30/2018 CLINICAL DATA:  Confusion EXAM: CT HEAD WITHOUT CONTRAST TECHNIQUE: Contiguous axial images were obtained from the base of the skull through the vertex without intravenous contrast. COMPARISON:  06/21/2017 FINDINGS: Brain: No evidence of acute infarction, hemorrhage, hydrocephalus, extra-axial collection or mass lesion/mass effect. Chronic atrophic and  white matter ischemic changes are noted. Vascular: No hyperdense vessel or unexpected calcification. Skull: Normal. Negative for fracture or focal lesion. Sinuses/Orbits: No acute finding. Other: None. IMPRESSION: Chronic atrophic and ischemic changes without acute abnormality. Electronically Signed   By: Inez Catalina M.D.   On: 07/30/2018 10:58   Mr Brain Wo Contrast  Result Date: 07/30/2018 CLINICAL DATA:  Focal neuro deficit less than 6 hours.  Confusion EXAM: MRI HEAD WITHOUT CONTRAST TECHNIQUE: Multiplanar, multiecho pulse sequences of the brain and surrounding structures were obtained without intravenous contrast. COMPARISON:  CT 07/30/2018, MRI 12/26/2015 FINDINGS: Brain: Negative for acute infarct. Generalized atrophy with mild chronic microvascular ischemic changes in the white matter. Negative for hemorrhage 6 mm extra-axial dural-based mass over the left frontal convexity compatible with small meningioma. Slight progression since 2017. Vascular: Normal arterial flow void.  Skull and upper cervical spine: Negative Sinuses/Orbits: Paranasal sinuses clear. Normal orbit. Bilateral mastoid effusion. Other: None IMPRESSION: Negative for acute infarct. Mild chronic microvascular ischemic change in the white matter 6 mm left frontal convexity meningioma with mild increase in size since 2017. Electronically Signed   By: Franchot Gallo M.D.   On: 07/30/2018 14:10    Assessment: 83 y.o. female with PMH of left breast cancer, post op afib on eliquis, HTN admitted for episode of confusion, difficulty speaking and getting words out. Now resolved. CT and MRI showed no acute infarct, but left frontal meningioma which is stable size comparing with MRI on 01/22/16. Given her afib hx on eliquis, TIA is a possibility. However, she is on eliquis and compliant with meds. I am more leaning towards hypertensive encephalopathy given her severe supine hypertension. Will increase night dose hydralazine.   Stroke Risk Factors - atrial fibrillation and hypertension  Plan: - HgbA1c, fasting lipid panel - CTA head and neck  - PT consult, OT consult, Speech consult - Echocardiogram - Prophylactic therapy- continue eliquis - Risk factor modification - Telemetry monitoring - Frequent neuro checks - increase night dose of hydralazine to 50mg  - outpt monitoring for left frontal meningioma.   Thank you for this consultation and allowing [place] to participate in the care of this patient. Will follow.   Rosalin Hawking, MD PhD Stroke Neurology 07/30/2018 4:56 PM

## 2018-07-30 NOTE — Procedures (Signed)
Echo attempted. Patient being transported to MRI. Will attempt later.

## 2018-07-30 NOTE — ED Notes (Signed)
Patient transported to MRI 

## 2018-07-30 NOTE — ED Notes (Signed)
Patient transported to CT 

## 2018-07-30 NOTE — H&P (Signed)
History and Physical  Patient Name: Heidi Adams     XQJ:[id number]    DOB: 01/09/1936    DOA: 07/30/2018 PCP: [name], MD   Patient coming from: Home     Chief Complaint: Speech disturbance  HPI: Heidi Adams is a 83 y.o. female with a past medical history significant for thyroidism, labile hypertension, A. fib on Eliquis, orthostasis who presents with transient episode of word salad.  The patient was her usual state of health until this morning, she was on the telephone with her insurance company when all of a sudden she could no longer speak the words that were in front of her.  Her husband witnessed this, and stated that she "appeared to be stuttering, could not get any words out at all".  This lasted for about 15 minutes and then resolve spontaneously, and she came to the emergency room.  She had no focal weakness, numbness, slurred speech, swallowing difficulties, ataxia, or loss of consciousness.  She has had some neck pain for about 3 weeks, as well as intermittent blurry vision today.  ED course: - Afebrile, heart rate 87, respirations and pulse ox normal, blood pressure 215/105 -Na 140, K 3.7, Cr 0.7, WBC 7.1K, Hgb 12.7 - Troponin negative - Urinalysis unremarkable -CT head without contrast showed no acute intracranial process -ECG showed left anterior fascicular block, some mild less than 1 cm ST depressions in lateral leads, normal sinus rhythm - The case was discussed with neurology and the hospital service were asked to evaluate for likely TIA     Review of systems:  Review of Systems  Constitutional: Negative for chills and fever.  HENT: Negative for hearing loss and tinnitus.   Eyes: Positive for blurred vision.  Respiratory: Negative for cough and shortness of breath.   Cardiovascular: Negative for chest pain, palpitations, orthopnea and leg swelling.  Musculoskeletal: Positive for neck pain.  Neurological: Negative for dizziness, sensory change, speech  change, focal weakness, seizures, weakness and headaches.  All other systems reviewed and are negative.        Past Medical History:  Diagnosis Date  . Allergy   . Anemia    hx of anemia  . Arthritis   . Breast cancer ([place])    Left breast  . Cancer ([place])    skin ca nose  . Dysrhythmia    went into Atrial Fibrillation after last surgery, HAS PALPITATIONS  . History of skin cancer   . Hypertension   . Hypothyroidism   . PVC (premature ventricular contraction)   . Thyroiditis 1973    Past Surgical History:  Procedure Laterality Date  . ABDOMINAL HYSTERECTOMY  89   TAH BSO  . APPENDECTOMY    . BREAST LUMPECTOMY Left 05/30/2017  . BREAST LUMPECTOMY WITH RADIOACTIVE SEED LOCALIZATION Left 05/30/2017   Procedure: LEFT BREAST LUMPECTOMY WITH RADIOACTIVE SEED LOCALIZATION;  Surgeon: Excell Seltzer, MD;  Location: [facility];  Service: General;  Laterality: Left;  . CHOLECYSTECTOMY     dr Georgia Lopes  . ELBOW SURGERY    . EYE SURGERY     "on my eyelids" years ago  . KNEE ARTHROSCOPY     left  . THYROIDECTOMY  1973  . TONSILLECTOMY    . TOTAL HIP ARTHROPLASTY Right 05/10/2016   Procedure: TOTAL HIP ARTHROPLASTY ANTERIOR APPROACH;  Surgeon: Paralee Cancel, MD;  Location: [facility];  Service: Orthopedics;  Laterality: Right;  . TOTAL HIP ARTHROPLASTY Left 01/10/2017   Procedure: LEFT TOTAL HIP ARTHROPLASTY  ANTERIOR APPROACH;  Surgeon: Paralee Cancel, MD;  Location: [facility];  Service: Orthopedics;  Laterality: Left;  . TOTAL KNEE ARTHROPLASTY Left 09/28/2015   Procedure: LEFT TOTAL KNEE ARTHROPLASTY;  Surgeon: Paralee Cancel, MD;  Location: [facility];  Service: Orthopedics;  Laterality: Left;  . TOTAL KNEE ARTHROPLASTY Right 08/09/2016   Procedure: RIGHT TOTAL KNEE ARTHROPLASTY;  Surgeon: Paralee Cancel, MD;  Location: [facility];  Service: Orthopedics;  Laterality: Right;  Adductor Block  . Total Left hip arthroplasty     01/10/17 Dr. Alvan Dame    Social History: Patient lives with her  husband.  Patient walks unassisted.  sHe  reports that she quit smoking about 31 years ago. She has never used smokeless tobacco. She reports that she does not drink alcohol or use drugs.  Allergies  Allergen Reactions  . Codeine Nausea Only  . Epinephrine     Tachcardyia, chest pains tremors  . Tenormin [Atenolol] Rash    Family history: family history includes Breast cancer in her cousin; Diabetes in her father; Hypertension in her father; Stroke in her father.  Prior to Admission medications   Medication Sig Start Date End Date Taking? Authorizing Provider  acetaminophen (TYLENOL) 500 MG tablet Take 1,000 mg by mouth 3 (three) times daily as needed for moderate pain.    Yes [provider]  apixaban (ELIQUIS) 5 MG TABS tablet Take 5 mg by mouth 2 (two) times daily.   Yes [provider]  hydrALAZINE (APRESOLINE) 25 MG tablet Take 25 mg by mouth 2 (two) times daily.    Yes [provider]  levothyroxine (SYNTHROID, LEVOTHROID) 112 MCG tablet Take 112 mcg by mouth daily before breakfast.   Yes [provider]  Misc Natural Products ([place] MSM FORMULA PO) Take 2 tablets by mouth daily.   Yes [provider]  nadolol (CORGARD) 20 MG tablet Take 20 mg by mouth daily.   Yes [provider]  pantoprazole (PROTONIX) 40 MG tablet Take 40 mg by mouth daily as needed (indigestion).  01/20/16  Yes [provider]     Physical Exam: BP (!) 202/104   Pulse 94   Temp 98 F (36.7 C) (Oral)   Resp 16   Ht 5\' 4"  (1.626 m)   Wt 66.7 kg   LMP 07/25/1988 Comment: full   SpO2 99%   BMI 25.23 kg/m  General appearance: Well-developed, thin elderly adult female, alert and in no acute distress.   Eyes: Anicteric, conjunctiva pink, lids and lashes normal. PERRL.    ENT: No nasal deformity, discharge, epistaxis.  Hearing normal. OP moist without lesions.    Lymph: No cervical, supraclavicular or axillary lymphadenopathy. Skin:  Warm and dry.  No jaundice.  No suspicious rashes or lesions. Cardiac: RRR, nl S1-S2, no murmurs appreciated.  Capillary refill is brisk.  JVP normale.  No LE edema.  Radial pulses 2+ and symmetric.  Marland Kitchen Respiratory: Normal respiratory rate and rhythm.  CTAB without rales or wheezes. GI: Abdomen soft without rigidity.  no TTP. No ascites, distension, no hepatosplenomegaly.   MSK: No deformities or effusions. Neuro: Pupils are 4 mm and reactive to 3 mm. She notes diplopia with far left eye movement.  Extraocular movements are intact, without nystagmus. Cranial nerve 5 is within normal limits. Cranial nerve 7 is symmetrical. Cranial nerve 8 is within normal limits. Cranial nerves 9 and 10 reveal equal palate elevation. Cranial nerve 11 reveals sternocleidomastoid strong. Cranial nerve 12 is midline. I do not note a  deficit in motor strength testing in the upper and lower extremities bilaterally with normal motor, tone and bulk. Romberg maneuver is negative for pathology. Finger-to-nose testing is within normal limits. Speech is fluent. Naming is grossly intact. Attention span and concentration are within normal limits.   Psych: The patient is oriented to time, place and person. Behavior appropriate.  Affect normal.  Recall, recent and remote, as well as general fund of knowledge seem within normal limits. No evidence of aural or visual hallucinations or delusions.       Labs on Admission:  I have personally reviewed following labs and imaging studies: CBC: Recent Labs  Lab 07/30/18 1039 07/30/18 1044  WBC 7.1  --   NEUTROABS 5.3  --   HGB 12.7 13.3  HCT 40.6 39.0  MCV 94.6  --   PLT 254  --    Basic Metabolic Panel: Recent Labs  Lab 07/30/18 1039 07/30/18 1044  NA 140 138  K 3.7 3.7  CL 108 107  CO2 24  --   GLUCOSE 94 95  BUN 16 14  CREATININE 0.69 0.60  CALCIUM 8.9  --    GFR: Estimated Creatinine Clearance: 52.7 mL/min (by C-G formula based on SCr of 0.6  mg/dL). Liver Function Tests: Recent Labs  Lab 07/30/18 1039  AST 12*  ALT 10  ALKPHOS 75  BILITOT 0.7  PROT 6.3*  ALBUMIN 3.5   No results for input(s): LIPASE, AMYLASE in the last 168 hours. No results for input(s): AMMONIA in the last 168 hours. Coagulation Profile: No results for input(s): INR, PROTIME in the last 168 hours. Cardiac Enzymes: Recent Labs  Lab 07/30/18 1039  TROPONINI <0.03   CBG: Recent Labs  Lab 07/30/18 1055  GLUCAP 89     Radiological Exams on Admission: CT head without contrast 07/30/2018  Shows no acute intracranial process, report reviewed  Personally reviewed: Ct Angio Head W Or Wo Contrast  Result Date: 07/30/2018 CLINICAL DATA:  Speech difficulty acute onset. EXAM: CT ANGIOGRAPHY HEAD AND NECK TECHNIQUE: Multidetector CT imaging of the head and neck was performed using the standard protocol during bolus administration of intravenous contrast. Multiplanar CT image reconstructions and MIPs were obtained to evaluate the vascular anatomy. Carotid stenosis measurements (when applicable) are obtained utilizing NASCET criteria, using the distal internal carotid diameter as the denominator. CONTRAST:  134mL ISOVUE-370 IOPAMIDOL (ISOVUE-370) INJECTION 76% COMPARISON:  Head CT 07/30/2018 FINDINGS: CTA NECK FINDINGS SKELETON: There is no bony spinal canal stenosis. No lytic or blastic lesion. OTHER NECK: Normal pharynx, larynx and major salivary glands. No cervical lymphadenopathy. Unremarkable thyroid gland. UPPER CHEST: No pneumothorax or pleural effusion. No nodules or masses. AORTIC ARCH: There is mild calcific atherosclerosis of the aortic arch. There is no aneurysm, dissection or hemodynamically significant stenosis of the visualized ascending aorta and aortic arch. Conventional 3 vessel aortic branching pattern. The visualized proximal subclavian arteries are widely patent. RIGHT CAROTID SYSTEM: --Common carotid artery: Widely patent origin without common  carotid artery dissection or aneurysm. --Internal carotid artery: No dissection, occlusion or aneurysm. Mild atherosclerotic calcification at the carotid bifurcation without hemodynamically significant stenosis. --External carotid artery: No acute abnormality. LEFT CAROTID SYSTEM: --Common carotid artery: Widely patent origin without common carotid artery dissection or aneurysm. --Internal carotid artery: No dissection, occlusion or aneurysm. Mild atherosclerotic calcification at the carotid bifurcation without hemodynamically significant stenosis. --External carotid artery: No acute abnormality. VERTEBRAL ARTERIES: Left dominant configuration. The right origin is normal. Mild narrowing of the left origin due to  atherosclerotic calcification. No dissection, occlusion or flow-limiting stenosis to the vertebrobasilar confluence. CTA HEAD FINDINGS ANTERIOR CIRCULATION: --Intracranial internal carotid arteries: Atherosclerotic calcification of the internal carotid arteries at the skull base without hemodynamically significant stenosis. --Anterior cerebral arteries: Normal. Both A1 segments are present. Patent anterior communicating artery. --Middle cerebral arteries: Normal. --Posterior communicating arteries: Absent bilaterally. POSTERIOR CIRCULATION: --Basilar artery: Normal. --Posterior cerebral arteries: Normal. --Superior cerebellar arteries: Normal. --Inferior cerebellar arteries: Normal anterior and posterior inferior cerebellar arteries. VENOUS SINUSES: As permitted by contrast timing, patent. ANATOMIC VARIANTS: Diminutive left transverse venous sinus is a normal variant DELAYED PHASE: No parenchymal contrast enhancement. Review of the MIP images confirms the above findings. IMPRESSION: 1. No emergent large vessel occlusion or high-grade stenosis. 2. Mild bilateral carotid bifurcation atherosclerosis without hemodynamically significant stenosis. Electronically Signed   By: Ulyses Jarred M.D.   On: 07/30/2018  17:57   Ct Head Wo Contrast  Result Date: 07/30/2018 CLINICAL DATA:  Confusion EXAM: CT HEAD WITHOUT CONTRAST TECHNIQUE: Contiguous axial images were obtained from the base of the skull through the vertex without intravenous contrast. COMPARISON:  06/21/2017 FINDINGS: Brain: No evidence of acute infarction, hemorrhage, hydrocephalus, extra-axial collection or mass lesion/mass effect. Chronic atrophic and white matter ischemic changes are noted. Vascular: No hyperdense vessel or unexpected calcification. Skull: Normal. Negative for fracture or focal lesion. Sinuses/Orbits: No acute finding. Other: None. IMPRESSION: Chronic atrophic and ischemic changes without acute abnormality. Electronically Signed   By: Inez Catalina M.D.   On: 07/30/2018 10:58   Ct Angio Neck W Or Wo Contrast  Result Date: 07/30/2018 CLINICAL DATA:  Speech difficulty acute onset. EXAM: CT ANGIOGRAPHY HEAD AND NECK TECHNIQUE: Multidetector CT imaging of the head and neck was performed using the standard protocol during bolus administration of intravenous contrast. Multiplanar CT image reconstructions and MIPs were obtained to evaluate the vascular anatomy. Carotid stenosis measurements (when applicable) are obtained utilizing NASCET criteria, using the distal internal carotid diameter as the denominator. CONTRAST:  173mL ISOVUE-370 IOPAMIDOL (ISOVUE-370) INJECTION 76% COMPARISON:  Head CT 07/30/2018 FINDINGS: CTA NECK FINDINGS SKELETON: There is no bony spinal canal stenosis. No lytic or blastic lesion. OTHER NECK: Normal pharynx, larynx and major salivary glands. No cervical lymphadenopathy. Unremarkable thyroid gland. UPPER CHEST: No pneumothorax or pleural effusion. No nodules or masses. AORTIC ARCH: There is mild calcific atherosclerosis of the aortic arch. There is no aneurysm, dissection or hemodynamically significant stenosis of the visualized ascending aorta and aortic arch. Conventional 3 vessel aortic branching pattern. The  visualized proximal subclavian arteries are widely patent. RIGHT CAROTID SYSTEM: --Common carotid artery: Widely patent origin without common carotid artery dissection or aneurysm. --Internal carotid artery: No dissection, occlusion or aneurysm. Mild atherosclerotic calcification at the carotid bifurcation without hemodynamically significant stenosis. --External carotid artery: No acute abnormality. LEFT CAROTID SYSTEM: --Common carotid artery: Widely patent origin without common carotid artery dissection or aneurysm. --Internal carotid artery: No dissection, occlusion or aneurysm. Mild atherosclerotic calcification at the carotid bifurcation without hemodynamically significant stenosis. --External carotid artery: No acute abnormality. VERTEBRAL ARTERIES: Left dominant configuration. The right origin is normal. Mild narrowing of the left origin due to atherosclerotic calcification. No dissection, occlusion or flow-limiting stenosis to the vertebrobasilar confluence. CTA HEAD FINDINGS ANTERIOR CIRCULATION: --Intracranial internal carotid arteries: Atherosclerotic calcification of the internal carotid arteries at the skull base without hemodynamically significant stenosis. --Anterior cerebral arteries: Normal. Both A1 segments are present. Patent anterior communicating artery. --Middle cerebral arteries: Normal. --Posterior communicating arteries: Absent bilaterally. POSTERIOR CIRCULATION: --Basilar artery: Normal. --Posterior  cerebral arteries: Normal. --Superior cerebellar arteries: Normal. --Inferior cerebellar arteries: Normal anterior and posterior inferior cerebellar arteries. VENOUS SINUSES: As permitted by contrast timing, patent. ANATOMIC VARIANTS: Diminutive left transverse venous sinus is a normal variant DELAYED PHASE: No parenchymal contrast enhancement. Review of the MIP images confirms the above findings. IMPRESSION: 1. No emergent large vessel occlusion or high-grade stenosis. 2. Mild bilateral carotid  bifurcation atherosclerosis without hemodynamically significant stenosis. Electronically Signed   By: Ulyses Jarred M.D.   On: 07/30/2018 17:57   Mr Brain Wo Contrast  Result Date: 07/30/2018 CLINICAL DATA:  Focal neuro deficit less than 6 hours.  Confusion EXAM: MRI HEAD WITHOUT CONTRAST TECHNIQUE: Multiplanar, multiecho pulse sequences of the brain and surrounding structures were obtained without intravenous contrast. COMPARISON:  CT 07/30/2018, MRI 12/26/2015 FINDINGS: Brain: Negative for acute infarct. Generalized atrophy with mild chronic microvascular ischemic changes in the white matter. Negative for hemorrhage 6 mm extra-axial dural-based mass over the left frontal convexity compatible with small meningioma. Slight progression since 2017. Vascular: Normal arterial flow void. Skull and upper cervical spine: Negative Sinuses/Orbits: Paranasal sinuses clear. Normal orbit. Bilateral mastoid effusion. Other: None IMPRESSION: Negative for acute infarct. Mild chronic microvascular ischemic change in the white matter 6 mm left frontal convexity meningioma with mild increase in size since 2017. Electronically Signed   By: Franchot Gallo M.D.   On: 07/30/2018 14:10     EKG: Independently reviewed. Rate 84, QTc normal, LAFB, <3mm ST depressions only.    Assessment/Plan Principal Problem:   TIA (transient ischemic attack) Active Problems:   Hypothyroidism   Essential hypertension   Hypertensive urgency   AF (paroxysmal atrial fibrillation) ([facility])  1. Acute TIA  This is new.  ABCD 2. -Admit to telemetry -Neuro checks, NIHSS per protocol -Continue Eliquis -Permissive hypertension for now per Neurology -Lipids, hemoglobin A1c ordered -CT angiography of head and neck per Neurology, ordered -Echocardiogram ordered -PT/OT/SLP consultation -Consult to Neurology, appreciate recommendations   2. Hypertensive urgency:  Normally has nadolol and hydralazine.  Because of orthostatic symptoms, she  wears compression hose and has reduced her antihypertensives -Restart nadolol and hydralazine  3. Atrial fibrillation, paroxysmal:  -Continue Eliquis  4. Hypothyoridism:  Stable.  -Continue levothyroxine       DVT prophylaxis: Not applicable, now on Eliquis Code Status: Full code Family Communication: Husband at the bedside Disposition Plan: Anticipate Stroke work up as above and consult to ancillary services.  Expect discharge within 24 hours. Consults called: Neurology Admission status: Telemetry, OBS status      Medical decision making: Patient seen at 12:30 PM on 07/30/2018.  The patient was discussed with Dr. Erlinda Hong and Dr. Francia Greaves. What exists of the patient's chart was reviewed in depth and summarized above.  Clinical condition: stable, symptoms resolved.       Edwin Dada [facility] Pager [phone]

## 2018-07-30 NOTE — ED Triage Notes (Signed)
Pt reports she was on the phone paying bills at [place] and got very confused and couldn't remember what to do and had trouble getting words together.

## 2018-07-31 DIAGNOSIS — G459 Transient cerebral ischemic attack, unspecified: Secondary | ICD-10-CM | POA: Diagnosis not present

## 2018-07-31 LAB — CBC
HCT: 42.8 % (ref 36.0–46.0)
HEMOGLOBIN: 13.2 g/dL (ref 12.0–15.0)
MCH: 29.5 pg (ref 26.0–34.0)
MCHC: 30.8 g/dL (ref 30.0–36.0)
MCV: 95.5 fL (ref 80.0–100.0)
Platelets: 271 10*3/uL (ref 150–400)
RBC: 4.48 MIL/uL (ref 3.87–5.11)
RDW: 13.9 % (ref 11.5–15.5)
WBC: 9.1 10*3/uL (ref 4.0–10.5)
nRBC: 0 % (ref 0.0–0.2)

## 2018-07-31 LAB — BASIC METABOLIC PANEL
Anion gap: 10 (ref 5–15)
BUN: 20 mg/dL (ref 8–23)
CO2: 22 mmol/L (ref 22–32)
Calcium: 9 mg/dL (ref 8.9–10.3)
Chloride: 108 mmol/L (ref 98–111)
Creatinine, Ser: 0.8 mg/dL (ref 0.44–1.00)
GFR calc Af Amer: >60 mL/min (ref >60)
GFR calc non Af Amer: >60 mL/min (ref >60)
Glucose, Bld: 97 mg/dL (ref 70–99)
Potassium: 3.6 mmol/L (ref 3.5–5.1)
Sodium: 140 mmol/L (ref 135–145)

## 2018-07-31 LAB — HEMOGLOBIN A1C
Hgb A1c MFr Bld: 5 % (ref 4.8–5.6)
Mean Plasma Glucose: 96.8 mg/dL

## 2018-07-31 LAB — LIPID PANEL
Cholesterol: 142 mg/dL (ref 0–200)
HDL: 45 mg/dL (ref >40)
LDL Cholesterol: 83 mg/dL (ref 0–99)
TRIGLYCERIDES: 72 mg/dL (ref <150)
Total CHOL/HDL Ratio: 3.2 RATIO
VLDL: 14 mg/dL (ref 0–40)

## 2018-07-31 MED ORDER — HYDRALAZINE HCL 25 MG PO TABS: 25.0000 mg | ORAL_TABLET | Freq: Every day | ORAL | 0 refills | Status: DC

## 2018-07-31 NOTE — Discharge Summary (Signed)
Physician Discharge Summary  Heidi Adams:[id number] DOB: Nov 22, 1935 DOA: 07/30/2018  PCP: [name], MD  Admit date: 07/30/2018 Discharge date: 07/31/2018  Time spent: 35 minutes  Recommendations for Outpatient Follow-up:  1. Recommend coordination with primary care physician and cardiologist Dr. [name] who has seen the patient in the past 2. Recommend basic metabolic panel and or DGLO-75 within 1 week and TSH in a month  Discharge Diagnoses:  Principal Problem:   TIA (transient ischemic attack) Active Problems:   Hypothyroidism   Essential hypertension   Hypertensive urgency   AF (paroxysmal atrial fibrillation) ([place])   Discharge Condition: Improved  Diet recommendation: Heart healthy  Filed Weights   07/30/18 1029  Weight: 66.7 kg    History of present illness:  83 year old female with prior left breast cancer, questionable.  A. fib on Eliquis, HTN admitted with some confusion she is also had a thyroidectomy in 1973 and is on Synthroid She had some episodic word salad and found she could not speak clearly well on the telephone In the mornings her blood pressures usually are pretty high and she takes hydralazine once a day for this but then finds blood pressures up in the evening and has not taken this She takes her Corgard only intermittently when she has palpitations  Blood pressures initially were 215/100 labs were relatively unremarkable and EKG was benign Neurology saw the patient in consult and recommended a CT angiogram neck and head which are both negative  She tells me prior to discharge that she has really high blood pressures when she is lying down and low blood pressures when she walks She was able to walk with me and hallways without any deficit or focal findings although she was a little unsteady secondary to her 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  I have encouraged her to take hydralazine 25 twice daily as opposed to once daily for better blood  pressure control she is to keep a monitor at this and she should follow-up with both her cardiologist in addition to her regular physician and make plans accordingly No change to medications recommended by neurology    Procedures:  CT angiogram head and neck shows stable findings with no acute embolus  Consultations:  Neurology  Discharge Exam: Vitals:   07/31/18 0433 07/31/18 0941  BP: (!) 172/82 138/61  Pulse: 77 88  Resp: 20   Temp: 98.1 F (36.7 C)   SpO2: 94%     General: Awake alert pleasant coherent smile symmetric power 5/5 Cardiovascular: S1-S2 no murmur rub or gallop Respiratory: Clinically clear no added sound Abdomen is soft nontender no rebound no guarding no focal neurological deficits noted discharge Sensory is intact Reflexes are intact Smile symmetric  Discharge Instructions   Discharge Instructions    Diet - low sodium heart healthy   Complete by:  As directed    Diet - low sodium heart healthy   Complete by:  As directed    Discharge instructions   Complete by:  As directed    We do not think you had a stroke instead your blood pressure was high and this may have been the cause for your symptoms that he came in You will require outpatient management of some of your blood pressure but for now temporarily I have increased your home dose of hydralazine to ensure that you have good blood pressure control   Discharge instructions   Complete by:  As directed    Recommend close follow-up with both your  primary care physician as well as your cardiologist regarding blood pressure Sounds like you have some orthostasis with movement and I hesitate to increase your blood pressure meds too much I think you might of had either a very small mini stroke or may be high blood pressure causing some symptoms mimicking a small stroke I doubt he will need therapy evaluation on discharge   Increase activity slowly   Complete by:  As directed    Increase activity slowly    Complete by:  As directed      Allergies as of 07/31/2018      Reactions   Codeine Nausea Only   Epinephrine    Tachcardyia, chest pains tremors   Tenormin [atenolol] Rash      Medication List    TAKE these medications   acetaminophen 500 MG tablet Commonly known as:  TYLENOL Take 1,000 mg by mouth 3 (three) times daily as needed for moderate pain.   ELIQUIS 5 MG Tabs tablet Generic drug:  apixaban Take 5 mg by mouth 2 (two) times daily.   GLUCOSAMINE CHOND MSM FORMULA PO Take 2 tablets by mouth daily.   hydrALAZINE 25 MG tablet Commonly known as:  APRESOLINE Take 1 tablet (25 mg total) by mouth daily with breakfast. Start taking on:  August 01, 2018 What changed:  when to take this   levothyroxine 112 MCG tablet Commonly known as:  SYNTHROID, LEVOTHROID Take 112 mcg by mouth daily before breakfast.   nadolol 20 MG tablet Commonly known as:  CORGARD Take 20 mg by mouth daily.   pantoprazole 40 MG tablet Commonly known as:  PROTONIX Take 40 mg by mouth daily as needed (indigestion).      Allergies  Allergen Reactions  . Codeine Nausea Only  . Epinephrine     Tachcardyia, chest pains tremors  . Tenormin [Atenolol] Rash      The results of significant diagnostics from this hospitalization (including imaging, microbiology, ancillary and laboratory) are listed below for reference.    Significant Diagnostic Studies: Ct Angio Head W Or Wo Contrast  Result Date: 07/30/2018 CLINICAL DATA:  Speech difficulty acute onset. EXAM: CT ANGIOGRAPHY HEAD AND NECK TECHNIQUE: Multidetector CT imaging of the head and neck was performed using the standard protocol during bolus administration of intravenous contrast. Multiplanar CT image reconstructions and MIPs were obtained to evaluate the vascular anatomy. Carotid stenosis measurements (when applicable) are obtained utilizing NASCET criteria, using the distal internal carotid diameter as the denominator. CONTRAST:  187mL  ISOVUE-370 IOPAMIDOL (ISOVUE-370) INJECTION 76% COMPARISON:  Head CT 07/30/2018 FINDINGS: CTA NECK FINDINGS SKELETON: There is no bony spinal canal stenosis. No lytic or blastic lesion. OTHER NECK: Normal pharynx, larynx and major salivary glands. No cervical lymphadenopathy. Unremarkable thyroid gland. UPPER CHEST: No pneumothorax or pleural effusion. No nodules or masses. AORTIC ARCH: There is mild calcific atherosclerosis of the aortic arch. There is no aneurysm, dissection or hemodynamically significant stenosis of the visualized ascending aorta and aortic arch. Conventional 3 vessel aortic branching pattern. The visualized proximal subclavian arteries are widely patent. RIGHT CAROTID SYSTEM: --Common carotid artery: Widely patent origin without common carotid artery dissection or aneurysm. --Internal carotid artery: No dissection, occlusion or aneurysm. Mild atherosclerotic calcification at the carotid bifurcation without hemodynamically significant stenosis. --External carotid artery: No acute abnormality. LEFT CAROTID SYSTEM: --Common carotid artery: Widely patent origin without common carotid artery dissection or aneurysm. --Internal carotid artery: No dissection, occlusion or aneurysm. Mild atherosclerotic calcification at the carotid bifurcation without hemodynamically significant  stenosis. --External carotid artery: No acute abnormality. VERTEBRAL ARTERIES: Left dominant configuration. The right origin is normal. Mild narrowing of the left origin due to atherosclerotic calcification. No dissection, occlusion or flow-limiting stenosis to the vertebrobasilar confluence. CTA HEAD FINDINGS ANTERIOR CIRCULATION: --Intracranial internal carotid arteries: Atherosclerotic calcification of the internal carotid arteries at the skull base without hemodynamically significant stenosis. --Anterior cerebral arteries: Normal. Both A1 segments are present. Patent anterior communicating artery. --Middle cerebral arteries:  Normal. --Posterior communicating arteries: Absent bilaterally. POSTERIOR CIRCULATION: --Basilar artery: Normal. --Posterior cerebral arteries: Normal. --Superior cerebellar arteries: Normal. --Inferior cerebellar arteries: Normal anterior and posterior inferior cerebellar arteries. VENOUS SINUSES: As permitted by contrast timing, patent. ANATOMIC VARIANTS: Diminutive left transverse venous sinus is a normal variant DELAYED PHASE: No parenchymal contrast enhancement. Review of the MIP images confirms the above findings. IMPRESSION: 1. No emergent large vessel occlusion or high-grade stenosis. 2. Mild bilateral carotid bifurcation atherosclerosis without hemodynamically significant stenosis. Electronically Signed   By: Ulyses Jarred M.D.   On: 07/30/2018 17:57   Ct Head Wo Contrast  Result Date: 07/30/2018 CLINICAL DATA:  Confusion EXAM: CT HEAD WITHOUT CONTRAST TECHNIQUE: Contiguous axial images were obtained from the base of the skull through the vertex without intravenous contrast. COMPARISON:  06/21/2017 FINDINGS: Brain: No evidence of acute infarction, hemorrhage, hydrocephalus, extra-axial collection or mass lesion/mass effect. Chronic atrophic and white matter ischemic changes are noted. Vascular: No hyperdense vessel or unexpected calcification. Skull: Normal. Negative for fracture or focal lesion. Sinuses/Orbits: No acute finding. Other: None. IMPRESSION: Chronic atrophic and ischemic changes without acute abnormality. Electronically Signed   By: Inez Catalina M.D.   On: 07/30/2018 10:58   Ct Angio Neck W Or Wo Contrast  Result Date: 07/30/2018 CLINICAL DATA:  Speech difficulty acute onset. EXAM: CT ANGIOGRAPHY HEAD AND NECK TECHNIQUE: Multidetector CT imaging of the head and neck was performed using the standard protocol during bolus administration of intravenous contrast. Multiplanar CT image reconstructions and MIPs were obtained to evaluate the vascular anatomy. Carotid stenosis measurements (when  applicable) are obtained utilizing NASCET criteria, using the distal internal carotid diameter as the denominator. CONTRAST:  177mL ISOVUE-370 IOPAMIDOL (ISOVUE-370) INJECTION 76% COMPARISON:  Head CT 07/30/2018 FINDINGS: CTA NECK FINDINGS SKELETON: There is no bony spinal canal stenosis. No lytic or blastic lesion. OTHER NECK: Normal pharynx, larynx and major salivary glands. No cervical lymphadenopathy. Unremarkable thyroid gland. UPPER CHEST: No pneumothorax or pleural effusion. No nodules or masses. AORTIC ARCH: There is mild calcific atherosclerosis of the aortic arch. There is no aneurysm, dissection or hemodynamically significant stenosis of the visualized ascending aorta and aortic arch. Conventional 3 vessel aortic branching pattern. The visualized proximal subclavian arteries are widely patent. RIGHT CAROTID SYSTEM: --Common carotid artery: Widely patent origin without common carotid artery dissection or aneurysm. --Internal carotid artery: No dissection, occlusion or aneurysm. Mild atherosclerotic calcification at the carotid bifurcation without hemodynamically significant stenosis. --External carotid artery: No acute abnormality. LEFT CAROTID SYSTEM: --Common carotid artery: Widely patent origin without common carotid artery dissection or aneurysm. --Internal carotid artery: No dissection, occlusion or aneurysm. Mild atherosclerotic calcification at the carotid bifurcation without hemodynamically significant stenosis. --External carotid artery: No acute abnormality. VERTEBRAL ARTERIES: Left dominant configuration. The right origin is normal. Mild narrowing of the left origin due to atherosclerotic calcification. No dissection, occlusion or flow-limiting stenosis to the vertebrobasilar confluence. CTA HEAD FINDINGS ANTERIOR CIRCULATION: --Intracranial internal carotid arteries: Atherosclerotic calcification of the internal carotid arteries at the skull base without hemodynamically significant stenosis.  --Anterior cerebral  arteries: Normal. Both A1 segments are present. Patent anterior communicating artery. --Middle cerebral arteries: Normal. --Posterior communicating arteries: Absent bilaterally. POSTERIOR CIRCULATION: --Basilar artery: Normal. --Posterior cerebral arteries: Normal. --Superior cerebellar arteries: Normal. --Inferior cerebellar arteries: Normal anterior and posterior inferior cerebellar arteries. VENOUS SINUSES: As permitted by contrast timing, patent. ANATOMIC VARIANTS: Diminutive left transverse venous sinus is a normal variant DELAYED PHASE: No parenchymal contrast enhancement. Review of the MIP images confirms the above findings. IMPRESSION: 1. No emergent large vessel occlusion or high-grade stenosis. 2. Mild bilateral carotid bifurcation atherosclerosis without hemodynamically significant stenosis. Electronically Signed   By: Ulyses Jarred M.D.   On: 07/30/2018 17:57   Mr Brain Wo Contrast  Result Date: 07/30/2018 CLINICAL DATA:  Focal neuro deficit less than 6 hours.  Confusion EXAM: MRI HEAD WITHOUT CONTRAST TECHNIQUE: Multiplanar, multiecho pulse sequences of the brain and surrounding structures were obtained without intravenous contrast. COMPARISON:  CT 07/30/2018, MRI 12/26/2015 FINDINGS: Brain: Negative for acute infarct. Generalized atrophy with mild chronic microvascular ischemic changes in the white matter. Negative for hemorrhage 6 mm extra-axial dural-based mass over the left frontal convexity compatible with small meningioma. Slight progression since 2017. Vascular: Normal arterial flow void. Skull and upper cervical spine: Negative Sinuses/Orbits: Paranasal sinuses clear. Normal orbit. Bilateral mastoid effusion. Other: None IMPRESSION: Negative for acute infarct. Mild chronic microvascular ischemic change in the white matter 6 mm left frontal convexity meningioma with mild increase in size since 2017. Electronically Signed   By: Franchot Gallo M.D.   On: 07/30/2018 14:10     Microbiology: No results found for this or any previous visit (from the past 240 hour(s)).   Labs: Basic Metabolic Panel: Recent Labs  Lab 07/30/18 1039 07/30/18 1044 07/31/18 0655  NA 140 138 140  K 3.7 3.7 3.6  CL 108 107 108  CO2 24  --  22  GLUCOSE 94 95 97  BUN 16 14 20   CREATININE 0.69 0.60 0.80  CALCIUM 8.9  --  9.0   Liver Function Tests: Recent Labs  Lab 07/30/18 1039  AST 12*  ALT 10  ALKPHOS 75  BILITOT 0.7  PROT 6.3*  ALBUMIN 3.5   No results for input(s): LIPASE, AMYLASE in the last 168 hours. No results for input(s): AMMONIA in the last 168 hours. CBC: Recent Labs  Lab 07/30/18 1039 07/30/18 1044 07/31/18 0655  WBC 7.1  --  9.1  NEUTROABS 5.3  --   --   HGB 12.7 13.3 13.2  HCT 40.6 39.0 42.8  MCV 94.6  --  95.5  PLT 254  --  271   Cardiac Enzymes: Recent Labs  Lab 07/30/18 1039  TROPONINI <0.03   BNP: BNP (last 3 results) No results for input(s): BNP in the last 8760 hours.  ProBNP (last 3 results) No results for input(s): PROBNP in the last 8760 hours.  CBG: Recent Labs  Lab 07/30/18 1055  GLUCAP 89       Signed:  Nita Sells MD   [facility] 07/31/2018, 11:10 AM

## 2018-07-31 NOTE — Progress Notes (Signed)
I assumed care of this pt @ 2300. I agree with the assessment completed by the previous nurse.

## 2018-07-31 NOTE — Evaluation (Signed)
Occupational Therapy Evaluation Patient Details Name: Heidi Adams MRN: [id number] DOB: 06/01/1936 Today's Date: 07/31/2018    History of Present Illness Pt was admitted with confusion and difficulty speaking.  Dx with TIA.  Found to have acute vs. chronic microischemic changes and L frontal meningioma which is stable from last imaging.  H/O breast CA and arthritis   Clinical Impression   This 83 year old female was admitted for the above.  She reports that husband does assist as needed with adls/iadls.  She mostly feels she needs help with her LLE and walking.  Provided washmitt for L hand as she reports dropping washcloth during bathing.  Educated on toilet aide, if she wants this.  Husband present and very supportive/helpful. No further OT needs identified.    Follow Up Recommendations  Supervision/Assistance - 24 hour    Equipment Recommendations  None recommended by OT    Recommendations for Other Services       Precautions / Restrictions Precautions Precautions: Fall Restrictions Weight Bearing Restrictions: No      Mobility Bed Mobility               General bed mobility comments: oob  Transfers Overall transfer level: Needs assistance Equipment used: None Transfers: Sit to/from Stand Sit to Stand: Supervision              Balance                                           ADL either performed or assessed with clinical judgement   ADL Overall ADL's : Needs assistance/impaired Eating/Feeding: Set up   Grooming: Wash/dry hands;Modified independent;Standing   Upper Body Bathing: Set up;With adaptive equipment   Lower Body Bathing: Minimal assistance   Upper Body Dressing : Set up   Lower Body Dressing: Moderate assistance   Toilet Transfer: Min guard;Supervision/safety   Toileting- Clothing Manipulation and Hygiene: Moderate assistance         General ADL Comments: pt has difficulty with hygiene; asked husband to assist  her.  Educated on toilet aide, if she is interested in this. Provided washmitt, which she was able to don and doff     Vision         Perception     Praxis      Pertinent Vitals/Pain Pain Assessment: Faces Faces Pain Scale: Hurts a little bit Pain Location: L elbow Pain Descriptors / Indicators: Aching Pain Intervention(s): Limited activity within patient's tolerance;Monitored during session(wears compression sleeve)     Hand Dominance     Extremity/Trunk Assessment Upper Extremity Assessment Upper Extremity Assessment: Generalized weakness;RUE deficits/detail;LUE deficits/detail LUE Deficits / Details: Pt reports that she drops things often; reports sensation wfls. She has been having difficulty using LUE for bathing:  provided washmitt for use at home.  With sxs, she has discomfort around thumbs and R palm           Communication Communication Communication: No difficulties   Cognition Arousal/Alertness: Awake/alert Behavior During Therapy: WFL for tasks assessed/performed                                   General Comments: pt is not a good historian.  Has had multiple UE sxs, could not provide rough timeline. Was good about calling her husband for assistance when  needed.  Pt has urgency and tended to rush getting to bathroom   General Comments  pt not unsteady but tends to rush to bathroom due to urgency:  she feels that she has a leg length discrepancy    Exercises     Shoulder Instructions      Home Living Family/patient expects to be discharged to:: Private residence Living Arrangements: Spouse/significant other                 Bathroom Shower/Tub: Occupational psychologist: Standard     Home Equipment: Shower seat - built in;Toilet riser          Prior Functioning/Environment Level of Independence: Needs assistance    ADL's / Homemaking Assistance Needed: husband helps with whatever she needs.  He helps with socks  consistently            OT Problem List:        OT Treatment/Interventions:      OT Goals(Current goals can be found in the care plan section) Acute Rehab OT Goals Patient Stated Goal: home today OT Goal Formulation: All assessment and education complete, DC therapy  OT Frequency:     Barriers to D/C:            Co-evaluation              AM-PAC OT "6 Clicks" Daily Activity     Outcome Measure Help from another person eating meals?: A Little Help from another person taking care of personal grooming?: A Little Help from another person toileting, which includes using toliet, bedpan, or urinal?: A Lot Help from another person bathing (including washing, rinsing, drying)?: A Little Help from another person to put on and taking off regular upper body clothing?: A Little Help from another person to put on and taking off regular lower body clothing?: A Lot 6 Click Score: 16   End of Session    Activity Tolerance: Patient tolerated treatment well Patient left: in chair;with call bell/phone within reach;with family/visitor present  OT Visit Diagnosis: Muscle weakness (generalized) (M62.81)                Time: 0086-7619 OT Time Calculation (min): 15 min Charges:  OT General Charges $OT Visit: 1 Visit OT Evaluation $OT Eval Low Complexity: 1 Low  Lesle Chris, OTR/L Acute Rehabilitation Services [phone] [facility] pager [phone] office 07/31/2018  [place] 07/31/2018, 11:59 AM

## 2018-07-31 NOTE — Care Management Note (Signed)
Case Management Note  Patient Details  Name: Heidi Adams MRN: [id number] Date of Birth: [birth year]  Subjective/Objective:  TIA. From home. PT cons-await recc.                  Action/Plan:dc home.   Expected Discharge Date:  07/31/18               Expected Discharge Plan:  Home/Self Care  In-House Referral:     Discharge planning Services  CM Consult  Post Acute Care Choice:    Choice offered to:     DME Arranged:    DME Agency:     HH Arranged:    HH Agency:     Status of Service:  Completed, signed off  If discussed at [employer] of Stay Meetings, dates discussed:    Additional Comments:  Dessa Phi, RN 07/31/2018, 11:15 AM

## 2018-07-31 NOTE — Plan of Care (Signed)
Patient was discharged before I came to see her today.  Overnight, she had further stroke work-up with CT head and neck showed mild bilateral carotid bifurcation atherosclerosis without significant stenosis.  EF 50%.  LDL 83 and A1c 5.0.   Her symptoms this time more likely hypertensive encephalopathy, although TIA cannot be completely ruled out. She is on Eliquis, recommend to continue Eliquis on discharge.   I discussed with Dr. [name] this morning regarding the supine hypertension and orthostatic hypotension.  Dr. [name] recommended long-acting BP meds and night and hydralazine 25 mg 3 times daily as needed once SBP more than 160 during the day.  I called patient at home and discussed with her regarding recommendations from Dr. [name].  She said she is only taking hydralazine as needed during the day, and she will continue to take hydralazine at night for her high BP but will make appointment to see Dr. [name] ASAP for further BP meds adjustment.  Heidi Hawking, MD PhD Stroke Neurology 07/31/2018 1:24 PM   To contact Stroke Continuity provider, please refer to [email or web address] After hours, contact General Neurology

## 2018-07-31 NOTE — Evaluation (Signed)
SLP Cancellation Note  Patient Details Name: Heidi Adams MRN: [id number] DOB: 03-17-1936   Cancelled treatment:       Reason Eval/Treat Not Completed: SLP screened, no needs identified, will sign off   Macario Golds 07/31/2018, 11:34 AM   Luanna Salk, MS [place] SLP Acute Rehab Services Pager [phone] Office [phone]

## 2018-07-31 NOTE — Evaluation (Signed)
Physical Therapy One Time Evaluation Patient Details Name: Heidi Adams MRN: [id number] DOB: 1935/10/19 Today's Date: 07/31/2018   History of Present Illness  Pt was admitted with confusion and difficulty speaking.  Dx with TIA.  Found to have acute vs. chronic microischemic changes and L frontal meningioma which is stable from last imaging.  H/O breast CA and arthritis  Clinical Impression  Patient evaluated by Physical Therapy with no further acute PT needs identified. All education has been completed and the patient has no further questions.  Pt reports "limp" with ambulation and hx of previous L THA and dislocations.  Pt does appear to have trendelenberg gait so recommend f/u with OP PT for standing and functional strengthening exercises.  Pt reports she is currently in OP PT however reports more seated and supine exercises being performed.  See below for any follow-up Physical Therapy or equipment needs. PT is signing off. Thank you for this referral.     Follow Up Recommendations Outpatient PT    Equipment Recommendations  None recommended by PT    Recommendations for Other Services       Precautions / Restrictions Precautions Precautions: Fall Restrictions Weight Bearing Restrictions: No      Mobility  Bed Mobility Overal bed mobility: Modified Independent             General bed mobility comments: oob  Transfers Overall transfer level: Needs assistance Equipment used: None Transfers: Sit to/from Stand Sit to Stand: Supervision         General transfer comment: supervision for safety, reported dizziness initially so returned to sitting to resolve; pt self aware and reports she usually wears compression stockings  Ambulation/Gait Ambulation/Gait assistance: Min guard;Supervision Gait Distance (Feet): 60 Feet Assistive device: None Gait Pattern/deviations: Step-through pattern;Trendelenburg     General Gait Details: pt reports "limp" however observed  trendelenburg gait, L hip weakness, no unsteadiness or LOB  Stairs            Wheelchair Mobility    Modified Rankin (Stroke Patients Only)       Balance Overall balance assessment: (denies any falls)                                           Pertinent Vitals/Pain Pain Assessment: No/denies pain Faces Pain Scale: Hurts a little bit Pain Location: L elbow Pain Descriptors / Indicators: Aching Pain Intervention(s): Limited activity within patient's tolerance;Monitored during session(wears compression sleeve)    Home Living Family/patient expects to be discharged to:: Private residence Living Arrangements: Spouse/significant other Available Help at Discharge: Family Type of Home: House       Home Layout: One level Home Equipment: Shower seat - built in;Toilet riser      Prior Function Level of Independence: Needs assistance;Independent with assistive device(s)   Gait / Transfers Assistance Needed: ambulatory without assistive device  ADL's / Homemaking Assistance Needed: husband helps with whatever she needs.  He helps with socks consistently        Hand Dominance        Extremity/Trunk Assessment   Upper Extremity Assessment Upper Extremity Assessment: Generalized weakness;RUE deficits/detail;LUE deficits/detail LUE Deficits / Details: Pt reports that she drops things often; reports sensation wfls. She has been having difficulty using LUE for bathing:  provided washmitt for use at home.  With sxs, she has discomfort around thumbs and R palm  Lower Extremity Assessment Lower Extremity Assessment: LLE deficits/detail LLE Deficits / Details: observed trendelenberg gait, L hip appears functionally weak (pt reports she is in OP PT for this)       Communication   Communication: No difficulties  Cognition Arousal/Alertness: Awake/alert Behavior During Therapy: WFL for tasks assessed/performed Overall Cognitive Status: Within Functional  Limits for tasks assessed                                 General Comments: pt is not a good historian.  Has had multiple UE sxs, could not provide rough timeline. Was good about calling her husband for assistance when needed.  Pt has urgency and tended to rush getting to bathroom      General Comments General comments (skin integrity, edema, etc.): pt not unsteady but tends to rush to bathroom due to urgency:  she feels that she has a leg length discrepancy    Exercises     Assessment/Plan    PT Assessment All further PT needs can be met in the next venue of care  PT Problem List Decreased strength;Decreased knowledge of use of DME;Decreased mobility       PT Treatment Interventions      PT Goals (Current goals can be found in the Care Plan section)  Acute Rehab PT Goals Patient Stated Goal: home today PT Goal Formulation: All assessment and education complete, DC therapy    Frequency     Barriers to discharge        Co-evaluation               AM-PAC PT "6 Clicks" Mobility  Outcome Measure Help needed turning from your back to your side while in a flat bed without using bedrails?: None Help needed moving from lying on your back to sitting on the side of a flat bed without using bedrails?: None Help needed moving to and from a bed to a chair (including a wheelchair)?: None Help needed standing up from a chair using your arms (e.g., wheelchair or bedside chair)?: A Little Help needed to walk in hospital room?: A Little Help needed climbing 3-5 steps with a railing? : A Little 6 Click Score: 21    End of Session Equipment Utilized During Treatment: Gait belt Activity Tolerance: Patient tolerated treatment well Patient left: in bed;with call bell/phone within reach;with family/visitor present   PT Visit Diagnosis: Muscle weakness (generalized) (M62.81);Other abnormalities of gait and mobility (R26.89)    Time: 6270-3500 PT Time Calculation (min)  (ACUTE ONLY): 18 min   Charges:   PT Evaluation $PT Eval Low Complexity: Burr, PT, DPT Acute Rehabilitation Services Office: [phone] Pager: [phone]   Trena Platt 07/31/2018, 12:45 PM

## 2018-08-02 DIAGNOSIS — M79644 Pain in right finger(s): Secondary | ICD-10-CM | POA: Diagnosis not present

## 2018-08-02 DIAGNOSIS — M542 Cervicalgia: Secondary | ICD-10-CM | POA: Insufficient documentation

## 2018-08-02 DIAGNOSIS — M13841 Other specified arthritis, right hand: Secondary | ICD-10-CM | POA: Diagnosis not present

## 2018-08-02 DIAGNOSIS — M25522 Pain in left elbow: Secondary | ICD-10-CM | POA: Insufficient documentation

## 2018-08-02 DIAGNOSIS — M13842 Other specified arthritis, left hand: Secondary | ICD-10-CM | POA: Diagnosis not present

## 2018-08-06 DIAGNOSIS — M9903 Segmental and somatic dysfunction of lumbar region: Secondary | ICD-10-CM | POA: Diagnosis not present

## 2018-08-06 DIAGNOSIS — M5022 Other cervical disc displacement, mid-cervical region, unspecified level: Secondary | ICD-10-CM | POA: Diagnosis not present

## 2018-08-06 DIAGNOSIS — M9902 Segmental and somatic dysfunction of thoracic region: Secondary | ICD-10-CM | POA: Diagnosis not present

## 2018-08-06 DIAGNOSIS — M5124 Other intervertebral disc displacement, thoracic region: Secondary | ICD-10-CM | POA: Diagnosis not present

## 2018-08-06 DIAGNOSIS — M5126 Other intervertebral disc displacement, lumbar region: Secondary | ICD-10-CM | POA: Diagnosis not present

## 2018-08-06 DIAGNOSIS — M9901 Segmental and somatic dysfunction of cervical region: Secondary | ICD-10-CM | POA: Diagnosis not present

## 2018-08-09 DIAGNOSIS — D4981 Neoplasm of unspecified behavior of retina and choroid: Secondary | ICD-10-CM | POA: Diagnosis not present

## 2018-08-09 DIAGNOSIS — H25813 Combined forms of age-related cataract, bilateral: Secondary | ICD-10-CM | POA: Diagnosis not present

## 2018-08-09 DIAGNOSIS — H04123 Dry eye syndrome of bilateral lacrimal glands: Secondary | ICD-10-CM | POA: Diagnosis not present

## 2018-08-09 DIAGNOSIS — H40013 Open angle with borderline findings, low risk, bilateral: Secondary | ICD-10-CM | POA: Diagnosis not present

## 2018-08-10 DIAGNOSIS — M542 Cervicalgia: Secondary | ICD-10-CM | POA: Diagnosis not present

## 2018-08-16 DIAGNOSIS — I10 Essential (primary) hypertension: Secondary | ICD-10-CM | POA: Diagnosis not present

## 2018-08-16 DIAGNOSIS — I951 Orthostatic hypotension: Secondary | ICD-10-CM | POA: Diagnosis not present

## 2018-08-16 DIAGNOSIS — I420 Dilated cardiomyopathy: Secondary | ICD-10-CM | POA: Diagnosis not present

## 2018-08-16 DIAGNOSIS — I480 Paroxysmal atrial fibrillation: Secondary | ICD-10-CM | POA: Diagnosis not present

## 2018-08-18 DIAGNOSIS — M5030 Other cervical disc degeneration, unspecified cervical region: Secondary | ICD-10-CM | POA: Diagnosis not present

## 2018-08-20 DIAGNOSIS — M5030 Other cervical disc degeneration, unspecified cervical region: Secondary | ICD-10-CM | POA: Insufficient documentation

## 2018-08-22 DIAGNOSIS — H2513 Age-related nuclear cataract, bilateral: Secondary | ICD-10-CM | POA: Diagnosis not present

## 2018-08-22 DIAGNOSIS — H2511 Age-related nuclear cataract, right eye: Secondary | ICD-10-CM | POA: Diagnosis not present

## 2018-08-27 ENCOUNTER — Other Ambulatory Visit: Payer: Self-pay | Admitting: Physical Medicine and Rehabilitation

## 2018-08-27 DIAGNOSIS — M5030 Other cervical disc degeneration, unspecified cervical region: Secondary | ICD-10-CM

## 2018-09-10 HISTORY — PX: CATARACT EXTRACTION: SUR2

## 2018-09-11 DIAGNOSIS — H2511 Age-related nuclear cataract, right eye: Secondary | ICD-10-CM | POA: Diagnosis not present

## 2018-09-11 DIAGNOSIS — H268 Other specified cataract: Secondary | ICD-10-CM | POA: Diagnosis not present

## 2018-09-11 DIAGNOSIS — H25811 Combined forms of age-related cataract, right eye: Secondary | ICD-10-CM | POA: Diagnosis not present

## 2018-09-20 ENCOUNTER — Encounter: Payer: Self-pay | Admitting: Cardiology

## 2018-09-20 ENCOUNTER — Ambulatory Visit (INDEPENDENT_AMBULATORY_CARE_PROVIDER_SITE_OTHER): Payer: PPO | Admitting: Cardiology

## 2018-09-20 VITALS — BP 158/84 | HR 88 | Ht 64.0 in | Wt 144.0 lb

## 2018-09-20 DIAGNOSIS — H2512 Age-related nuclear cataract, left eye: Secondary | ICD-10-CM | POA: Diagnosis not present

## 2018-09-20 DIAGNOSIS — I951 Orthostatic hypotension: Secondary | ICD-10-CM | POA: Diagnosis not present

## 2018-09-20 DIAGNOSIS — I5032 Chronic diastolic (congestive) heart failure: Secondary | ICD-10-CM | POA: Diagnosis not present

## 2018-09-20 DIAGNOSIS — I480 Paroxysmal atrial fibrillation: Secondary | ICD-10-CM

## 2018-09-20 DIAGNOSIS — I10 Essential (primary) hypertension: Secondary | ICD-10-CM | POA: Diagnosis not present

## 2018-09-20 MED ORDER — NADOLOL 20 MG PO TABS: 20.0000 mg | ORAL_TABLET | Freq: Every day | ORAL | 1 refills | Status: DC

## 2018-09-20 NOTE — Progress Notes (Signed)
Subjective:  Primary Physician:  [name], MD  Patient ID: Heidi Adams, female    DOB: 1936/01/08, 83 y.o.   MRN: [id number]  Chief Complaint  Patient presents with  . Atrial Fibrillation    1 month F/U  . Hypertension    HPI: Heidi Adams  is a 83 y.o. female  with no significant prior cardiac history except for bilateral hip replacement and knee replacement and left breast lumpectomy in 2018 without chemotherapy or radiation therapy for breast cancer and also remote hysterectomy due to uterine cancer in 1991, supine hypertension and orthostatic hypotension. She has chronic dizziness on standing. She has had one episode of atrial fibrillation when she presented with marked dizziness to [facility] in 2017 and converted to sinus rhythm on Amiodarone  Patient recently admitted with appears to be ?TIA, on 07/30/2018, she did not have any neurologic deficits, CT scan and MRI did not reveal any acute infarct. Carotid artery duplex did not reveal any significant stenosis. She was found to have severely elevated blood pressure but she was also orthostatic. She is intolerant to multiple medications and has stopped all meds except taking hydralazine PRN for SBP >160 mm Hg. She states that she has not had any atrial fibrillation that is clearly documented and hence wants to come off of medications. Except for chronic dizziness, dyspnea, no new symptoms.   Past Medical History:  Diagnosis Date  . Allergy   . Anemia    hx of anemia  . Arthritis   . Breast cancer ([place])    Left breast  . Cancer ([place])    skin ca nose  . Dysrhythmia    went into Atrial Fibrillation after last surgery, HAS PALPITATIONS  . History of skin cancer   . Hypertension   . Hypothyroidism   . PVC (premature ventricular contraction)   . Thyroiditis 1973    Past Surgical History:  Procedure Laterality Date  . ABDOMINAL HYSTERECTOMY  90   TAH BSO  . APPENDECTOMY    . BREAST LUMPECTOMY  Left 05/30/2017  . BREAST LUMPECTOMY WITH RADIOACTIVE SEED LOCALIZATION Left 05/30/2017   Procedure: LEFT BREAST LUMPECTOMY WITH RADIOACTIVE SEED LOCALIZATION;  Surgeon: Excell Seltzer, MD;  Location: [facility];  Service: General;  Laterality: Left;  . CATARACT EXTRACTION  09/10/2018   Right eye  . CHOLECYSTECTOMY     dr Georgia Lopes  . ELBOW SURGERY    . EYE SURGERY     "on my eyelids" years ago  . KNEE ARTHROSCOPY     left  . THYROIDECTOMY  1973  . TONSILLECTOMY    . TOTAL HIP ARTHROPLASTY Right 05/10/2016   Procedure: TOTAL HIP ARTHROPLASTY ANTERIOR APPROACH;  Surgeon: Paralee Cancel, MD;  Location: [facility];  Service: Orthopedics;  Laterality: Right;  . TOTAL HIP ARTHROPLASTY Left 01/10/2017   Procedure: LEFT TOTAL HIP ARTHROPLASTY ANTERIOR APPROACH;  Surgeon: Paralee Cancel, MD;  Location: [facility];  Service: Orthopedics;  Laterality: Left;  . TOTAL KNEE ARTHROPLASTY Left 09/28/2015   Procedure: LEFT TOTAL KNEE ARTHROPLASTY;  Surgeon: Paralee Cancel, MD;  Location: [facility];  Service: Orthopedics;  Laterality: Left;  . TOTAL KNEE ARTHROPLASTY Right 08/09/2016   Procedure: RIGHT TOTAL KNEE ARTHROPLASTY;  Surgeon: Paralee Cancel, MD;  Location: [facility];  Service: Orthopedics;  Laterality: Right;  Adductor Block  . Total Left hip arthroplasty     01/10/17 Dr. Alvan Dame    Social History   Socioeconomic History  . Marital  status: Married    Spouse name: Not on file  . Number of children: 2  . Years of education: Not on file  . Highest education level: Not on file  Occupational History  . Not on file  Social Needs  . Financial resource strain: Not on file  . Food insecurity:    Worry: Not on file    Inability: Not on file  . Transportation needs:    Medical: Not on file    Non-medical: Not on file  Tobacco Use  . Smoking status: Former Smoker    Packs/day: 1.00    Years: 20.00    Pack years: 20.00    Types: Cigarettes    Last attempt to quit: 07/26/1987    Years since quitting:  31.1  . Smokeless tobacco: Never Used  Substance and Sexual Activity  . Alcohol use: No    Alcohol/week: 0.0 standard drinks  . Drug use: No  . Sexual activity: Not Currently    Comment: 1st intercourse 36 yo-1 partner  Lifestyle  . Physical activity:    Days per week: Not on file    Minutes per session: Not on file  . Stress: Not on file  Relationships  . Social connections:    Talks on phone: Not on file    Gets together: Not on file    Attends religious service: Not on file    Active member of club or organization: Not on file    Attends meetings of clubs or organizations: Not on file    Relationship status: Not on file  . Intimate partner violence:    Fear of current or ex partner: Not on file    Emotionally abused: Not on file    Physically abused: Not on file    Forced sexual activity: Not on file  Other Topics Concern  . Not on file  Social History Narrative  . Not on file    Current Outpatient Medications on File Prior to Visit  Medication Sig Dispense Refill  . acetaminophen (TYLENOL) 500 MG tablet Take 1,000 mg by mouth 3 (three) times daily as needed for moderate pain.     Marland Kitchen apixaban (ELIQUIS) 5 MG TABS tablet Take 5 mg by mouth 2 (two) times daily.    . hydrALAZINE (APRESOLINE) 25 MG tablet Take 1 tablet (25 mg total) by mouth daily with breakfast. 60 tablet 0  . levothyroxine (SYNTHROID, LEVOTHROID) 112 MCG tablet Take 112 mcg by mouth daily before breakfast.    . Misc Natural Products (GLUCOSAMINE CHOND MSM FORMULA PO) Take 2 tablets by mouth daily.    . nadolol (CORGARD) 20 MG tablet Take 20 mg by mouth daily.    . pantoprazole (PROTONIX) 40 MG tablet Take 40 mg by mouth daily as needed (indigestion).      No current facility-administered medications on file prior to visit.      Review of Systems  Constitutional: Positive for malaise/fatigue. Negative for weight loss.  Respiratory: Positive for shortness of breath (mild). Negative for cough and  hemoptysis.   Cardiovascular: Negative for chest pain, palpitations, claudication and leg swelling.  Gastrointestinal: Negative for abdominal pain, blood in stool, constipation, heartburn and vomiting.  Genitourinary: Negative for dysuria.  Musculoskeletal: Negative for joint pain and myalgias.  Neurological: Positive for dizziness (chronic). Negative for focal weakness and headaches.  Endo/Heme/Allergies: Does not bruise/bleed easily.  Psychiatric/Behavioral: Negative for depression. The patient is not nervous/anxious.   All other systems reviewed and are negative.  Objective:  Blood pressure (!) 180/100, pulse 85, last menstrual period 07/25/1988, SpO2 96 %. There is no height or weight on file to calculate BMI.  Physical Exam  Constitutional: She appears well-developed and well-nourished. No distress.  HENT:  Head: Atraumatic.  Eyes: Conjunctivae are normal.  Neck: Neck supple. No JVD present. No thyromegaly present.  Cardiovascular: Normal rate, regular rhythm, normal heart sounds and intact distal pulses. Exam reveals no gallop.  No murmur heard. Pulmonary/Chest: Effort normal and breath sounds normal.  Abdominal: Soft. Bowel sounds are normal.  Musculoskeletal: Normal range of motion.        General: No edema.  Neurological: She is alert.  Skin: Skin is warm and dry.  Psychiatric: She has a normal mood and affect.    CARDIAC STUDIES:   Echocardiogram  07/30/2018 - Left ventricle: The cavity size was normal. There was moderate   concentric hypertrophy. Systolic function assessment limited due   to frequent PAC&'s. LVEF probably around 50%. Wall motion was   normal; there were no regional wall motion abnormalities. Doppler   parameters are consistent with abnormal left ventricular   relaxation (grade 1 diastolic dysfunction). - Aortic valve: Mildly calcified annulus. Trileaflet valve. There   was moderate regurgitation.  Echocardiogram 03/21/2018: Left ventricle cavity is  normal in size. Moderate concentric hypertrophy of the left ventricle. Moderate decrease in global wall motion. Doppler evidence of grade II (pseudonormal) diastolic dysfunction, elevated LAP. Calculated EF 30%. Left atrial cavity is normal in size. Aneurysmal interatrial septum without PFO. Moderate (Grade II) aortic regurgitation. Mild mitral valve leaflet calcification. Study is technically inadequate to assess for mitral stenosis.  Moderate (Grade II) mitral regurgitation. Mild tricuspid regurgitation. Estimated pulmonary artery systolic pressure 24  mmHg. Mild pulmonic regurgitation. Insignificant pericardial effusion. IVC is dilated with respiratory variation. Estimated RA pressure 8 mmHg.  Lexiscan Myoview stress test  04/23/2018: 1. The resting electrocardiogram demonstrated normal sinus rhythm, normal resting conduction and no resting arrhythmias.  Stress EKG is non-diagnostic for ischemia as it a pharmacologic stress using Lexiscan. Occasional PAC and PVC. The patient developed significant symptoms which included Chest Pain, Stomach pain, Headache.  2. The overall quality of the study is good. There is no evidence of abnormal lung activity. Stress and rest SPECT images demonstrate homogeneous tracer distribution throughout the myocardium. Gated SPECT imaging reveals diffuse global hypokinesis. The left ventricular ejection fraction was markedly depressed at (26%).   3. This is a high risk study due to severe LV systolic dysfunction. Findings may represent non ischemic cardiomyopathy.   Assessment & Recommendations:   1. AF (paroxysmal atrial fibrillation) ([place]) Presented to [facility] hospital on 05/06/2016 with A. Fib with RVR and converted to sinus on Amio. No further documented A. Fib.   CHA2DS2-VASc Score is 6.  -(CHF; HTN; vasc disease DM,  Female = 1; Age <65 =0; 65-74 = 1,  >75 =2; stroke = 2).  -(Yearly risk of stroke: Score of 1=1.3; 2=2.2; 3=3.2; 4=4; 5=6.7; 6=9.8; 7=>9.8)   2.  Chronic combined systolic and diastolic HF (heart failure) ([place]) resolved. Now has chronic diastolic CHF with mild dyspnea.  3. Orthostatic hypotension resolved.  4. Essential hypertension   Recommendation:  Patient is here on a one-month follow-up visit of orthostatic hypotension, supine hypertension, remote atrial fibrillation 2017.  Today she is not orthostatic.  Her blood pressure is slightly elevated today.  She has not been taking dyspnea for anti-hypertensive medications stating that the blood pressure has been fairly well controlled and she  feels poorly when she takes the blood pressure medications but has taken hydralazine on a p.r.n. basis for blood pressure greater than 160 mmHg.  She has in the past tolerated Nadalol without side effects.  Advised her that she would benefit from being on it and my help in preventing atrial fibrillation and control  Hypertension. She was advised to continue anticoagulation in view of high risk for cardioembolic phenomena.  An echocardiogram performed the hospital reveals improved ejection fraction from 30%.  This may be the reason why her orthostatic hypotension must have improved.  Hopefully continue beta blockers will help to keep her ejection fraction improved.  I'll see her back in 6 months.   Adrian Prows, MD, [facility] 09/20/2018, 3:03 PM [facility] [place] Pager: [phone] Office: [phone] If no answer Cell [phone]

## 2018-09-22 ENCOUNTER — Encounter: Payer: Self-pay | Admitting: Cardiology

## 2018-09-22 DIAGNOSIS — I5022 Chronic systolic (congestive) heart failure: Secondary | ICD-10-CM | POA: Insufficient documentation

## 2018-09-22 DIAGNOSIS — I5042 Chronic combined systolic (congestive) and diastolic (congestive) heart failure: Secondary | ICD-10-CM

## 2018-09-22 HISTORY — DX: Chronic combined systolic (congestive) and diastolic (congestive) heart failure: I50.42

## 2018-10-02 DIAGNOSIS — H25812 Combined forms of age-related cataract, left eye: Secondary | ICD-10-CM | POA: Diagnosis not present

## 2018-10-02 DIAGNOSIS — H2512 Age-related nuclear cataract, left eye: Secondary | ICD-10-CM | POA: Diagnosis not present

## 2018-10-02 DIAGNOSIS — H268 Other specified cataract: Secondary | ICD-10-CM | POA: Diagnosis not present

## 2018-10-03 ENCOUNTER — Telehealth: Payer: Self-pay

## 2018-10-09 NOTE — Telephone Encounter (Signed)
Disregard msg

## 2018-10-17 DIAGNOSIS — E039 Hypothyroidism, unspecified: Secondary | ICD-10-CM | POA: Diagnosis not present

## 2018-10-17 DIAGNOSIS — I10 Essential (primary) hypertension: Secondary | ICD-10-CM | POA: Diagnosis not present

## 2018-10-17 DIAGNOSIS — E7849 Other hyperlipidemia: Secondary | ICD-10-CM | POA: Diagnosis not present

## 2018-12-03 ENCOUNTER — Ambulatory Visit (INDEPENDENT_AMBULATORY_CARE_PROVIDER_SITE_OTHER): Payer: PPO | Admitting: Podiatry

## 2018-12-03 ENCOUNTER — Other Ambulatory Visit: Payer: Self-pay

## 2018-12-03 ENCOUNTER — Encounter: Payer: Self-pay | Admitting: Podiatry

## 2018-12-03 VITALS — BP 138/90 | HR 88 | Temp 98.4°F | Resp 16

## 2018-12-03 DIAGNOSIS — L600 Ingrowing nail: Secondary | ICD-10-CM

## 2018-12-03 DIAGNOSIS — M79676 Pain in unspecified toe(s): Secondary | ICD-10-CM

## 2018-12-03 DIAGNOSIS — D689 Coagulation defect, unspecified: Secondary | ICD-10-CM

## 2018-12-03 NOTE — Patient Instructions (Signed)

## 2018-12-03 NOTE — Progress Notes (Signed)
Subjective:  Patient ID: Heidi Adams, female    DOB: [birth year],  MRN: [id number]  Chief Complaint  Patient presents with  . Nail Problem    Rt hallux lateral border side nail problem flare up  1 wk ago but it's been going on for years; 10/10 throbbign pain -w/ redness, and clear drainage -pt denies N/V/F?CH     83 y.o. female presents with the above complaint. Also complains of ingrown nail to the left but not as bad.  Review of Systems: Negative except as noted in the HPI. Denies N/V/F/Ch.  Past Medical History:  Diagnosis Date  . Allergy   . Anemia    hx of anemia  . Arthritis   . Breast cancer ([place])    Left breast  . Cancer ([place])    skin ca nose  . Chronic combined systolic and diastolic CHF (congestive heart failure) ([place]) 09/22/2018  . Chronic combined systolic and diastolic CHF (congestive heart failure) ([place]) 09/22/2018  . Chronic combined systolic and diastolic CHF (congestive heart failure) ([place]) 09/22/2018  . Dysrhythmia    went into Atrial Fibrillation after last surgery, HAS PALPITATIONS  . History of skin cancer   . Hypertension   . Hypothyroidism   . PVC (premature ventricular contraction)   . Thyroiditis 1973    Current Outpatient Medications:  .  acetaminophen (TYLENOL) 500 MG tablet, Take 1,000 mg by mouth 3 (three) times daily as needed for moderate pain. , Disp: , Rfl:  .  apixaban (ELIQUIS) 5 MG TABS tablet, Take 5 mg by mouth 2 (two) times daily., Disp: , Rfl:  .  diclofenac sodium (VOLTAREN) 1 % GEL, , Disp: , Rfl:  .  hydrALAZINE (APRESOLINE) 25 MG tablet, Take 1 tablet (25 mg total) by mouth daily with breakfast. (Patient taking differently: Take 25 mg by mouth 3 (three) times daily as needed. Patient takes if PRN of BP > 160 mm Hg), Disp: 60 tablet, Rfl: 0 .  levothyroxine (SYNTHROID, LEVOTHROID) 112 MCG tablet, Take 112 mcg by mouth daily before breakfast., Disp: , Rfl:  .  Misc Natural Products (GLUCOSAMINE CHOND MSM FORMULA PO), Take 2 tablets by  mouth daily., Disp: , Rfl:  .  nadolol (CORGARD) 20 MG tablet, Take 1 tablet (20 mg total) by mouth daily., Disp: 90 tablet, Rfl: 1 .  pantoprazole (PROTONIX) 40 MG tablet, Take 40 mg by mouth daily as needed (indigestion). , Disp: , Rfl:   Social History   Tobacco Use  Smoking Status Former Smoker  . Packs/day: 1.00  . Years: 20.00  . Pack years: 20.00  . Types: Cigarettes  . Last attempt to quit: 07/26/1987  . Years since quitting: 31.3  Smokeless Tobacco Never Used    Allergies  Allergen Reactions  . Codeine Nausea Only  . Epinephrine     Tachcardyia, chest pains tremors  . Tenormin [Atenolol] Rash   Objective:   Vitals:   12/03/18 1110  BP: 138/90  Pulse: 88  Resp: 16  Temp: 98.4 F (36.9 C)   There is no height or weight on file to calculate BMI. Constitutional Well developed. Well nourished.  Vascular Dorsalis pedis pulses palpable bilaterally. Posterior tibial pulses palpable bilaterally. Capillary refill normal to all digits.  No cyanosis or clubbing noted. Pedal hair growth normal.  Neurologic Normal speech. Oriented to person, place, and time. Epicritic sensation to light touch grossly present bilaterally.  Dermatologic Painful ingrowing nail at lateral nail borders of the hallux nail right, ingrown nail left  lateral border. No other open wounds. No skin lesions.  Orthopedic: Normal joint ROM without pain or crepitus bilaterally. No visible deformities. No bony tenderness.   Radiographs: None Assessment:   1. Ingrown nail   2. Pain around toenail   3. Coagulation defect [facility])    Plan:  Patient was evaluated and treated and all questions answered.  Ingrown Nail, bilaterally -Patient elects to proceed with minor surgery to remove ingrown toenail removal today on the right. Left ingrown nail debrided in slant back fashion. -Ingrown nail excised. See procedure note. -Educated on post-procedure care including soaking. Written instructions provided  and reviewed. -Patient to follow up in 2 weeks for nail check.  Procedure: Excision of Ingrown Toenail Location: Right 1st toe lateral nail borders. Anesthesia: Lidocaine 1% plain; 1.5 mL and Marcaine 0.5% plain; 1.5 mL, digital block. Skin Prep: Betadine. Dressing: Silvadene; telfa; dry, sterile, compression dressing. Technique: Following skin prep, the toe was exsanguinated and a tourniquet was secured at the base of the toe. The affected nail border was freed, split with a nail splitter, and excised. The tourniquet was then removed and sterile dressing applied. Disposition: Patient tolerated procedure well. Patient to return in 2 weeks for follow-up.   Procedure: Nail Debridement Rationale: ingrown nail Type of Debridement: manual, sharp debridement. Instrumentation: Nail nipper, rotary burr. Number of Nails: 1    No follow-ups on file.

## 2018-12-07 ENCOUNTER — Other Ambulatory Visit: Payer: Self-pay | Admitting: Hematology and Oncology

## 2018-12-07 DIAGNOSIS — Z853 Personal history of malignant neoplasm of breast: Secondary | ICD-10-CM

## 2018-12-18 ENCOUNTER — Encounter: Payer: Self-pay | Admitting: Podiatry

## 2018-12-18 ENCOUNTER — Other Ambulatory Visit: Payer: Self-pay

## 2018-12-18 ENCOUNTER — Ambulatory Visit (INDEPENDENT_AMBULATORY_CARE_PROVIDER_SITE_OTHER): Payer: PPO | Admitting: Podiatry

## 2018-12-18 VITALS — Temp 96.5°F | Resp 16

## 2018-12-18 DIAGNOSIS — M19079 Primary osteoarthritis, unspecified ankle and foot: Secondary | ICD-10-CM

## 2018-12-18 DIAGNOSIS — M2011 Hallux valgus (acquired), right foot: Secondary | ICD-10-CM

## 2018-12-18 DIAGNOSIS — L600 Ingrowing nail: Secondary | ICD-10-CM | POA: Diagnosis not present

## 2018-12-18 DIAGNOSIS — M19071 Primary osteoarthritis, right ankle and foot: Secondary | ICD-10-CM

## 2018-12-18 NOTE — Progress Notes (Signed)
Subjective:  Patient ID: Heidi Adams, female    DOB: 08-May-1936,  MRN: [id number]  Chief Complaint  Patient presents with  . nail check    F/U Rt hallux lateral border nail check Pt. states," it's doing good." tx: soap and water -pt denies pain/drainage/redness/swelling    83 y.o. female presents with the above complaint.  States that the nail border she had removed at her last visit is healing well but would like the other nail border of the nail removed today.  Having pain in the top of the right foot that she states hurts most with walking and was hurting towards the outside of the foot.  States that her bunion could be causing her issues and would like to discuss whether she is to have removed  Review of Systems: Negative except as noted in the HPI. Denies N/V/F/Ch.  Past Medical History:  Diagnosis Date  . Allergy   . Anemia    hx of anemia  . Arthritis   . Breast cancer ([place])    Left breast  . Cancer ([place])    skin ca nose  . Chronic combined systolic and diastolic CHF (congestive heart failure) ([place]) 09/22/2018  . Chronic combined systolic and diastolic CHF (congestive heart failure) ([place]) 09/22/2018  . Chronic combined systolic and diastolic CHF (congestive heart failure) ([place]) 09/22/2018  . Dysrhythmia    went into Atrial Fibrillation after last surgery, HAS PALPITATIONS  . History of skin cancer   . Hypertension   . Hypothyroidism   . PVC (premature ventricular contraction)   . Thyroiditis 1973    Current Outpatient Medications:  .  acetaminophen (TYLENOL) 500 MG tablet, Take 1,000 mg by mouth 3 (three) times daily as needed for moderate pain. , Disp: , Rfl:  .  apixaban (ELIQUIS) 5 MG TABS tablet, Take 5 mg by mouth 2 (two) times daily., Disp: , Rfl:  .  diclofenac sodium (VOLTAREN) 1 % GEL, , Disp: , Rfl:  .  hydrALAZINE (APRESOLINE) 25 MG tablet, Take 1 tablet (25 mg total) by mouth daily with breakfast. (Patient taking differently: Take 25 mg by mouth 3 (three)  times daily as needed. Patient takes if PRN of BP > 160 mm Hg), Disp: 60 tablet, Rfl: 0 .  levothyroxine (SYNTHROID, LEVOTHROID) 112 MCG tablet, Take 112 mcg by mouth daily before breakfast., Disp: , Rfl:  .  Misc Natural Products (GLUCOSAMINE CHOND MSM FORMULA PO), Take 2 tablets by mouth daily., Disp: , Rfl:  .  nadolol (CORGARD) 20 MG tablet, Take 1 tablet (20 mg total) by mouth daily., Disp: 90 tablet, Rfl: 1 .  pantoprazole (PROTONIX) 40 MG tablet, Take 40 mg by mouth daily as needed (indigestion). , Disp: , Rfl:   Social History   Tobacco Use  Smoking Status Former Smoker  . Packs/day: 1.00  . Years: 20.00  . Pack years: 20.00  . Types: Cigarettes  . Last attempt to quit: 07/26/1987  . Years since quitting: 31.4  Smokeless Tobacco Never Used    Allergies  Allergen Reactions  . Codeine Nausea Only  . Epinephrine     Tachcardyia, chest pains tremors  . Tenormin [Atenolol] Rash   Objective:   Vitals:   12/18/18 1021  Resp: 16  Temp: (!) 96.5 F (35.8 C)   There is no height or weight on file to calculate BMI. Constitutional Well developed. Well nourished.  Vascular Dorsalis pedis pulses palpable bilaterally. Posterior tibial pulses palpable bilaterally. Capillary refill normal to all digits.  No cyanosis or clubbing noted. Pedal hair growth normal.  Neurologic Normal speech. Oriented to person, place, and time. Epicritic sensation to light touch grossly present bilaterally.  Dermatologic Painful ingrowing nail at medial nail borders of the hallux nail right  No other open wounds. No skin lesions.  Orthopedic: Normal joint ROM without pain or crepitus bilaterally. Slight HAV deformity right without pain palpation Pain to palpation about the dorsal midfoot with bony prominence   Radiographs: None Assessment:   1. Ingrown nail   2. Arthritis of midfoot   3. Acquired hallux valgus of right foot    Plan:  Patient was evaluated and treated and all questions  answered.  Ingrown Nail, right -Lateral border healing well -Medial border avulsed as below  Procedure: Avulsion of toenail Location: Right 1st medial border  Anesthesia: Lidocaine 1% plain; 1.5 mL and Marcaine 0.5% plain; 1.5 mL, digital block. Skin Prep: Betadine. Dressing: Silvadene; telfa; dry, sterile, compression dressing. Technique: Following skin prep, the toe was exsanguinated and a tourniquet was secured at the base of the toe. The nail border was freed, split with a nail spliter  and avulsed with a hemostat. The area was cleansed. The tourniquet was then removed and sterile dressing applied. Disposition: Patient tolerated procedure well.    HAV deformity right -Advised against surgical correction as she is not having direct pain palpation about the prominence  Midfoot arthritis right -Injection delivered to dorsal TMT's  Procedure: Joint Injection Location: Right dorsal 1st/2nd TMTJ joint Skin Prep: Alcohol. Injectate: 0.5 cc 1% lidocaine plain, 0.5 cc dexamethasone phosphate. Disposition: Patient tolerated procedure well. Injection site dressed with a band-aid.   Return in about 2 weeks (around 01/01/2019) for f/u ingrown and arthritis right foot.

## 2018-12-18 NOTE — Patient Instructions (Signed)

## 2018-12-25 DIAGNOSIS — H01004 Unspecified blepharitis left upper eyelid: Secondary | ICD-10-CM | POA: Diagnosis not present

## 2018-12-25 DIAGNOSIS — H01002 Unspecified blepharitis right lower eyelid: Secondary | ICD-10-CM | POA: Diagnosis not present

## 2018-12-25 DIAGNOSIS — H01001 Unspecified blepharitis right upper eyelid: Secondary | ICD-10-CM | POA: Diagnosis not present

## 2018-12-25 DIAGNOSIS — H01005 Unspecified blepharitis left lower eyelid: Secondary | ICD-10-CM | POA: Diagnosis not present

## 2018-12-27 ENCOUNTER — Ambulatory Visit
Admission: RE | Admit: 2018-12-27 | Discharge: 2018-12-27 | Disposition: A | Discharge status: Home / Self Care | Payer: PPO | Source: Ambulatory Visit | Attending: Hematology and Oncology | Admitting: Hematology and Oncology

## 2018-12-27 ENCOUNTER — Other Ambulatory Visit: Payer: Self-pay

## 2018-12-27 DIAGNOSIS — Z853 Personal history of malignant neoplasm of breast: Secondary | ICD-10-CM

## 2018-12-27 DIAGNOSIS — R928 Other abnormal and inconclusive findings on diagnostic imaging of breast: Secondary | ICD-10-CM | POA: Diagnosis not present

## 2018-12-27 IMAGING — MG DIGITAL DIAGNOSTIC BILATERAL MAMMOGRAM WITH TOMO AND CAD
6 of 11 series · 6 of 31 positions shown · non-contrast
Comparison: Previous exam(s).

CLINICAL DATA: 82-year-old female for annual follow-up. History of
RIGHT breast cancer and lumpectomy in 3716.

EXAM:
DIGITAL DIAGNOSTIC BILATERAL MAMMOGRAM WITH CAD AND TOMO

[L MLO]
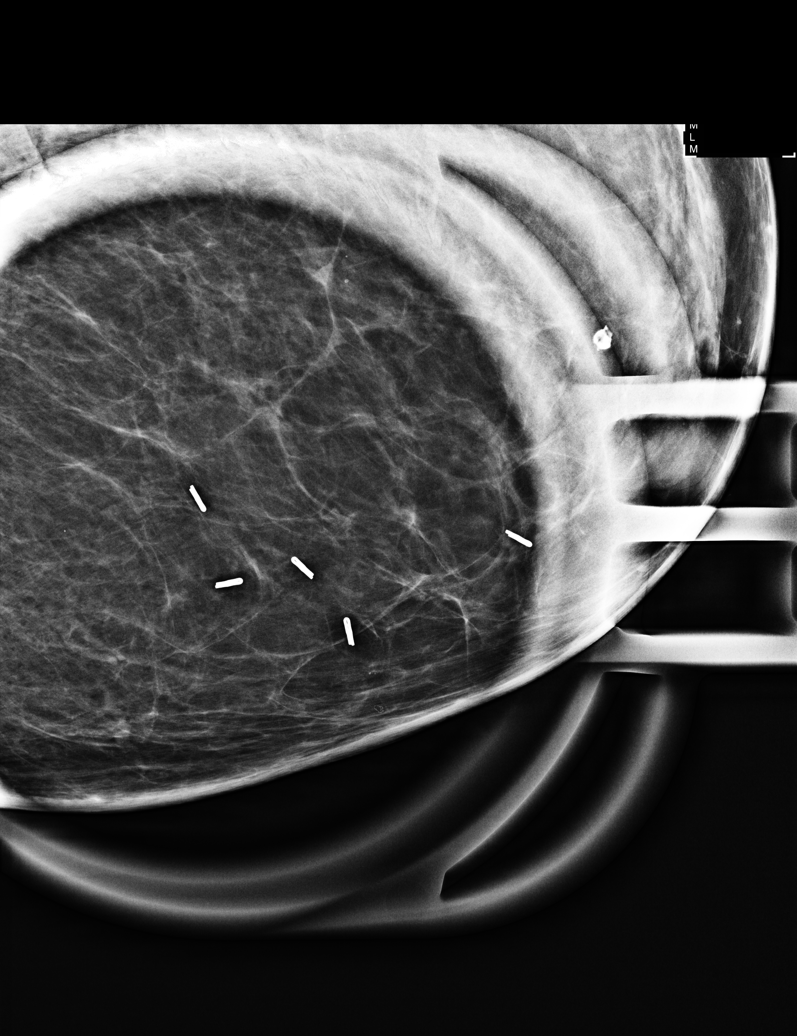

[L MLO synth-2D]
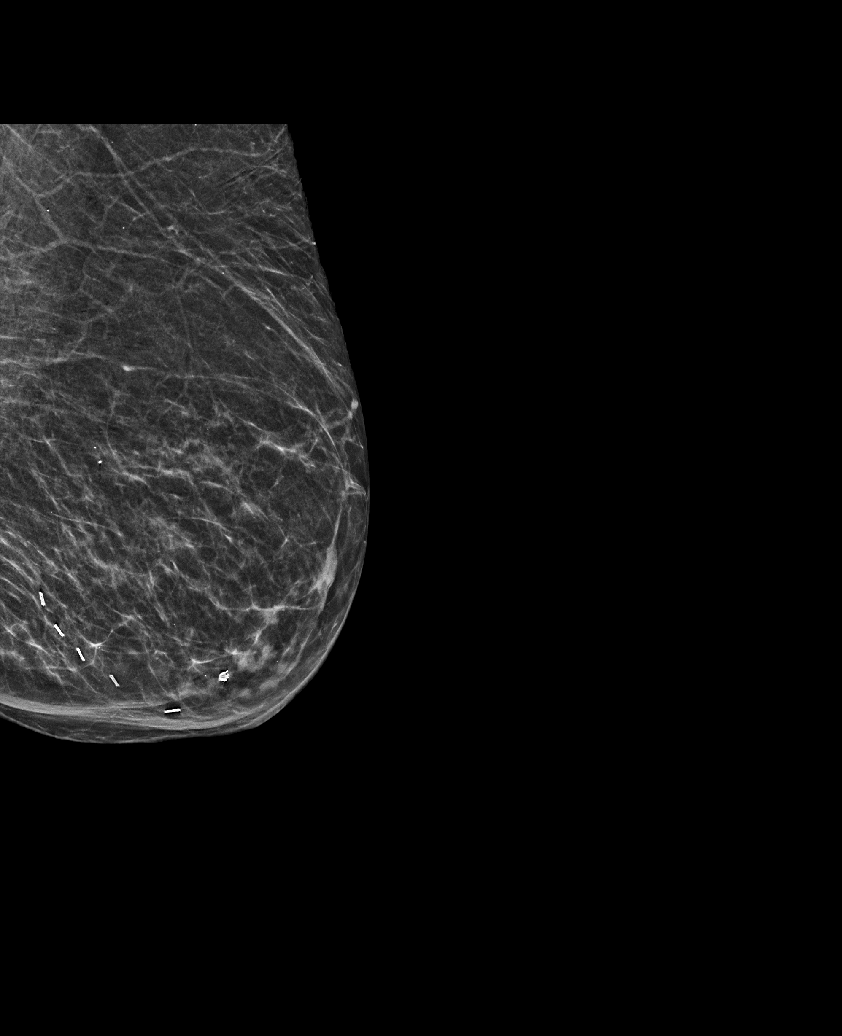

[R CC synth-2D (1 of 2)]
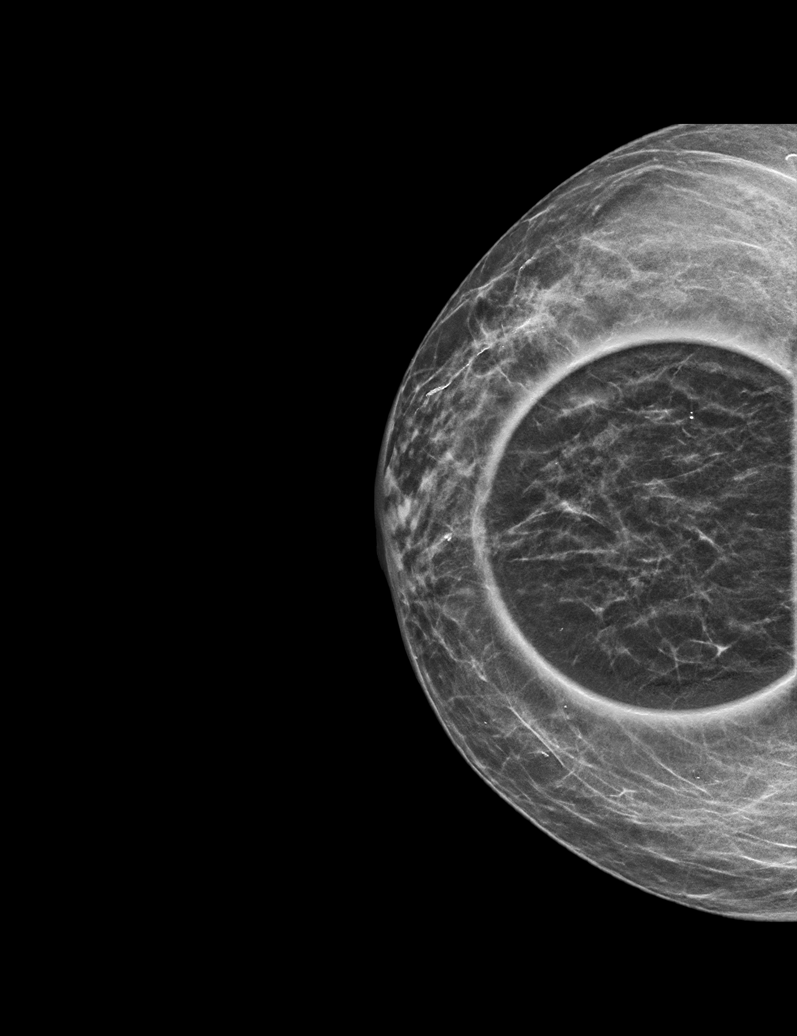

[R MLO synth-2D]
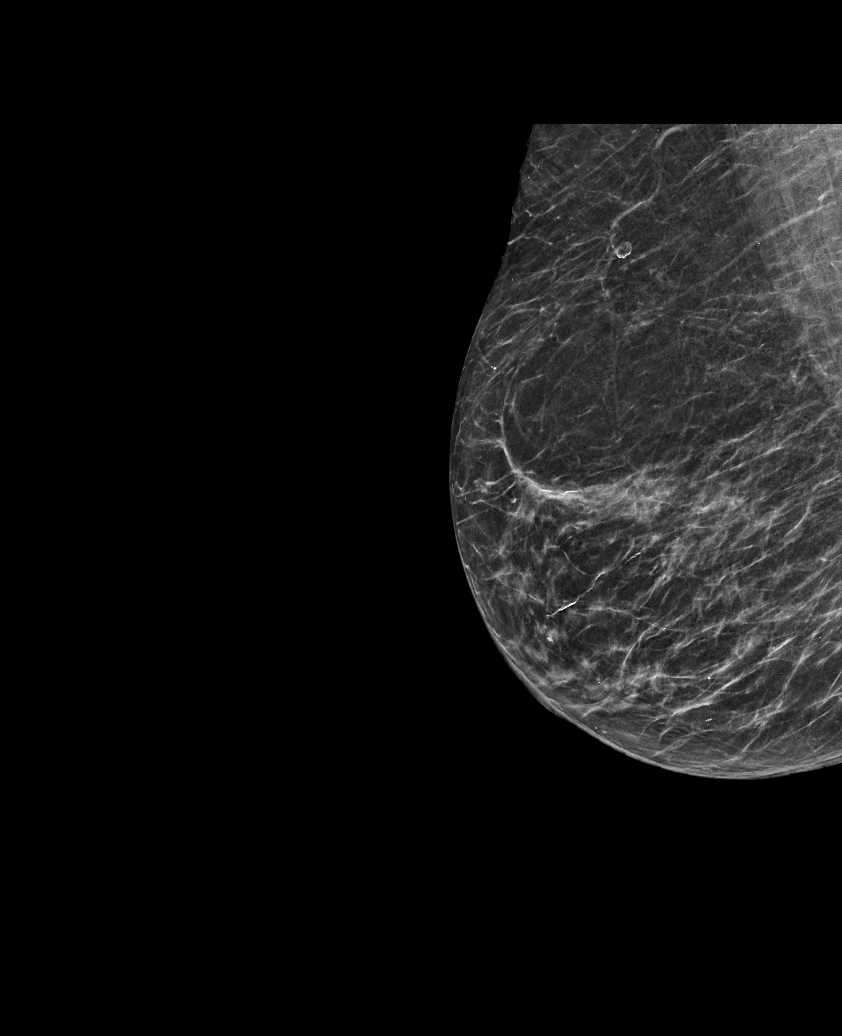

[L CC synth-2D]
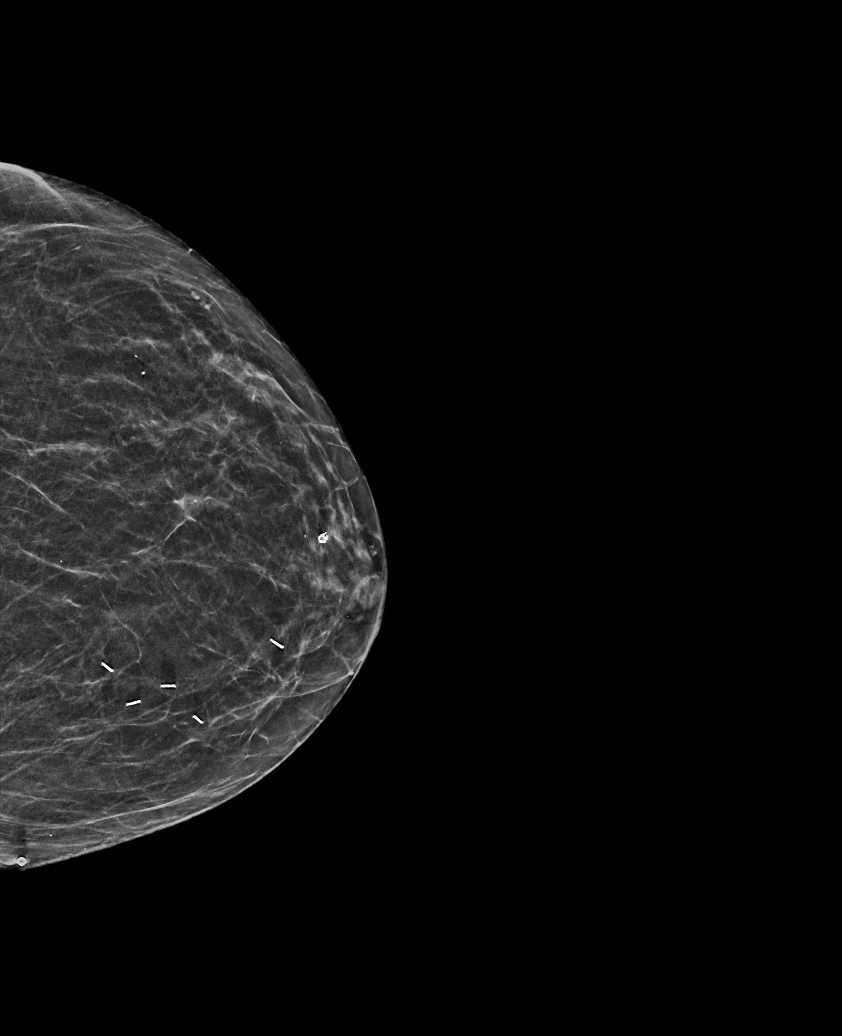

[R CC synth-2D (2 of 2)]
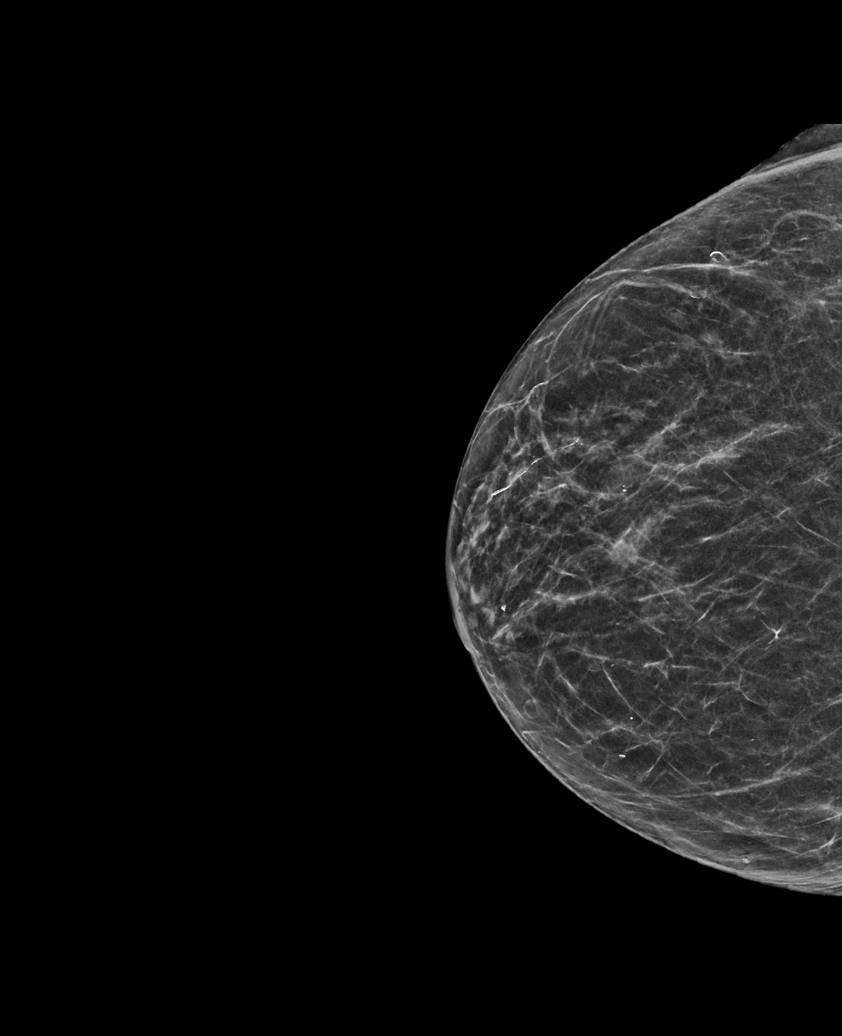

[6 of 31 positions shown; findings below may reference images not displayed]

ACR Breast Density Category b: There are scattered areas of
fibroglandular density.
FINDINGS: 2D and 3D full field views of both breasts, a magnification view of
the lumpectomy site and a spot compression view of the RIGHT breast
demonstrate no suspicious mass, nonsurgical distortion or worrisome
calcifications.

LEFT lumpectomy changes again noted.

Mammographic images were processed with CAD.
IMPRESSION: No mammographic evidence of breast malignancy.

RECOMMENDATION:
Bilateral diagnostic mammogram in 1 year

I have discussed the findings and recommendations with the patient.
Results were also provided in writing at the conclusion of the
visit. If applicable, a reminder letter will be sent to the patient
regarding the next appointment.

BI-RADS CATEGORY  2: Benign.

## 2019-01-01 ENCOUNTER — Ambulatory Visit (INDEPENDENT_AMBULATORY_CARE_PROVIDER_SITE_OTHER): Payer: PPO | Admitting: Podiatry

## 2019-01-01 ENCOUNTER — Other Ambulatory Visit: Payer: Self-pay

## 2019-01-01 ENCOUNTER — Ambulatory Visit (INDEPENDENT_AMBULATORY_CARE_PROVIDER_SITE_OTHER): Payer: PPO

## 2019-01-01 ENCOUNTER — Encounter: Payer: Self-pay | Admitting: Podiatry

## 2019-01-01 VITALS — Temp 96.7°F | Resp 16

## 2019-01-01 DIAGNOSIS — M7750 Other enthesopathy of unspecified foot: Secondary | ICD-10-CM

## 2019-01-01 DIAGNOSIS — M109 Gout, unspecified: Secondary | ICD-10-CM

## 2019-01-01 DIAGNOSIS — M7751 Other enthesopathy of right foot: Secondary | ICD-10-CM

## 2019-01-01 DIAGNOSIS — L600 Ingrowing nail: Secondary | ICD-10-CM

## 2019-01-01 DIAGNOSIS — M7752 Other enthesopathy of left foot: Secondary | ICD-10-CM | POA: Diagnosis not present

## 2019-01-01 NOTE — Patient Instructions (Addendum)
Ingrown Nail -No need to continue soaking. No need for continued band-aid   Gout  Gout is a condition that causes painful swelling of the joints. Gout is a type of inflammation of the joints (arthritis). This condition is caused by having too much uric acid in the body. Uric acid is a chemical that forms when the body breaks down substances called purines. Purines are important for building body proteins. When the body has too much uric acid, sharp crystals can form and build up inside the joints. This causes pain and swelling. Gout attacks can happen quickly and may be very painful (acute gout). Over time, the attacks can affect more joints and become more frequent (chronic gout). Gout can also cause uric acid to build up under the skin and inside the kidneys. What are the causes? This condition is caused by too much uric acid in your blood. This can happen because:  Your kidneys do not remove enough uric acid from your blood. This is the most common cause.  Your body makes too much uric acid. This can happen with some cancers and cancer treatments. It can also occur if your body is breaking down too many red blood cells (hemolytic anemia).  You eat too many foods that are high in purines. These foods include organ meats and some seafood. Alcohol, especially beer, is also high in purines. A gout attack may be triggered by trauma or stress. What increases the risk? You are more likely to develop this condition if you:  Have a family history of gout.  Are female and middle-aged.  Are female and have gone through menopause.  Are obese.  Frequently drink alcohol, especially beer.  Are dehydrated.  Lose weight too quickly.  Have an organ transplant.  Have lead poisoning.  Take certain medicines, including aspirin, cyclosporine, diuretics, levodopa, and niacin.  Have kidney disease.  Have a skin condition called psoriasis. What are the signs or symptoms? An attack of acute gout  happens quickly. It usually occurs in just one joint. The most common place is the big toe. Attacks often start at night. Other joints that may be affected include joints of the feet, ankle, knee, fingers, wrist, or elbow. Symptoms of this condition may include:  Severe pain.  Warmth.  Swelling.  Stiffness.  Tenderness. The affected joint may be very painful to touch.  Shiny, red, or purple skin.  Chills and fever. Chronic gout may cause symptoms more frequently. More joints may be involved. You may also have white or yellow lumps (tophi) on your hands or feet or in other areas near your joints. How is this diagnosed? This condition is diagnosed based on your symptoms, medical history, and physical exam. You may have tests, such as:  Blood tests to measure uric acid levels.  Removal of joint fluid with a thin needle (aspiration) to look for uric acid crystals.  X-rays to look for joint damage. How is this treated? Treatment for this condition has two phases: treating an acute attack and preventing future attacks. Acute gout treatment may include medicines to reduce pain and swelling, including:  NSAIDs.  Steroids. These are strong anti-inflammatory medicines that can be taken by mouth (orally) or injected into a joint.  Colchicine. This medicine relieves pain and swelling when it is taken soon after an attack. It can be given by mouth or through an IV. Preventive treatment may include:  Daily use of smaller doses of NSAIDs or colchicine.  Use of a medicine that  reduces uric acid levels in your blood.  Changes to your diet. You may need to see a dietitian about what to eat and drink to prevent gout. Follow these instructions at home: During a gout attack   If directed, put ice on the affected area: ? Put ice in a plastic bag. ? Place a towel between your skin and the bag. ? Leave the ice on for 20 minutes, 2-3 times a day.  Raise (elevate) the affected joint above the  level of your heart as often as possible.  Rest the joint as much as possible. If the affected joint is in your leg, you may be given crutches to use.  Follow instructions from your health care provider about eating or drinking restrictions. Avoiding future gout attacks  Follow a low-purine diet as told by your dietitian or health care provider. Avoid foods and drinks that are high in purines, including liver, kidney, anchovies, asparagus, herring, mushrooms, mussels, and beer.  Maintain a healthy weight or lose weight if you are overweight. If you want to lose weight, talk with your health care provider. It is important that you do not lose weight too quickly.  Start or maintain an exercise program as told by your health care provider. Eating and drinking  Drink enough fluids to keep your urine pale yellow.  If you drink alcohol: ? Limit how much you use to:  0-1 drink a day for women.  0-2 drinks a day for men. ? Be aware of how much alcohol is in your drink. In the [place] one drink equals one 12 oz bottle of beer (355 mL) one 5 oz glass of wine (148 mL), or one 1 oz glass of hard liquor (44 mL). General instructions  Take over-the-counter and prescription medicines only as told by your health care provider.  Do not drive or use heavy machinery while taking prescription pain medicine.  Return to your normal activities as told by your health care provider. Ask your health care provider what activities are safe for you.  Keep all follow-up visits as told by your health care provider. This is important. Contact a health care provider if you have:  Another gout attack.  Continuing symptoms of a gout attack after 10 days of treatment.  Side effects from your medicines.  Chills or a fever.  Burning pain when you urinate.  Pain in your lower back or belly. Get help right away if you:  Have severe or uncontrolled pain.  Cannot urinate. Summary  Gout is painful swelling  of the joints caused by inflammation.  The most common site of pain is the big toe, but it can affect other joints in the body.  Medicines and dietary changes can help to prevent and treat gout attacks. This information is not intended to replace advice given to you by your health care provider. Make sure you discuss any questions you have with your health care provider. Document Released: 07/08/2000 Document Revised: 01/31/2018 Document Reviewed: 01/31/2018 Elsevier Interactive Patient Education  2019 [employer].

## 2019-01-01 NOTE — Progress Notes (Signed)
Subjective:  Patient ID: Heidi Adams, female    DOB: October 14, 1935,  MRN: [id number]  Chief Complaint  Patient presents with  . nail check    F/U Rt hallux nail check Pt. states," it's better." _pt denies pain/drianage/redness/swelling Tx: (d/C) espsom salt and Abx ointment    83 y.o. female presents with the above complaint. History as above. Soaked as directed. New complaint of pain to both great toe joints, chronic in nature but with new warmth and redness to the left great toe joint. Endorses consumption of beer, wine, cheese.  Review of Systems: Negative except as noted in the HPI. Denies N/V/F/Ch.  Past Medical History:  Diagnosis Date  . Allergy   . Anemia    hx of anemia  . Arthritis   . Breast cancer ([place])    Left breast  . Cancer ([place])    skin ca nose  . Chronic combined systolic and diastolic CHF (congestive heart failure) ([place]) 09/22/2018  . Chronic combined systolic and diastolic CHF (congestive heart failure) ([place]) 09/22/2018  . Chronic combined systolic and diastolic CHF (congestive heart failure) ([place]) 09/22/2018  . Dysrhythmia    went into Atrial Fibrillation after last surgery, HAS PALPITATIONS  . History of skin cancer   . Hypertension   . Hypothyroidism   . PVC (premature ventricular contraction)   . Thyroiditis 1973    Current Outpatient Medications:  .  acetaminophen (TYLENOL) 500 MG tablet, Take 1,000 mg by mouth 3 (three) times daily as needed for moderate pain. , Disp: , Rfl:  .  apixaban (ELIQUIS) 5 MG TABS tablet, Take 5 mg by mouth 2 (two) times daily., Disp: , Rfl:  .  diclofenac sodium (VOLTAREN) 1 % GEL, , Disp: , Rfl:  .  hydrALAZINE (APRESOLINE) 25 MG tablet, Take 1 tablet (25 mg total) by mouth daily with breakfast. (Patient taking differently: Take 25 mg by mouth 3 (three) times daily as needed. Patient takes if PRN of BP > 160 mm Hg), Disp: 60 tablet, Rfl: 0 .  levothyroxine (SYNTHROID, LEVOTHROID) 112 MCG tablet, Take 112 mcg by mouth daily  before breakfast., Disp: , Rfl:  .  Misc Natural Products (GLUCOSAMINE CHOND MSM FORMULA PO), Take 2 tablets by mouth daily., Disp: , Rfl:  .  nadolol (CORGARD) 20 MG tablet, Take 1 tablet (20 mg total) by mouth daily., Disp: 90 tablet, Rfl: 1 .  pantoprazole (PROTONIX) 40 MG tablet, Take 40 mg by mouth daily as needed (indigestion). , Disp: , Rfl:  .  tobramycin-dexamethasone (TOBRADEX) ophthalmic solution, , Disp: , Rfl:   Social History   Tobacco Use  Smoking Status Former Smoker  . Packs/day: 1.00  . Years: 20.00  . Pack years: 20.00  . Types: Cigarettes  . Last attempt to quit: 07/26/1987  . Years since quitting: 31.4  Smokeless Tobacco Never Used    Allergies  Allergen Reactions  . Codeine Nausea Only  . Epinephrine     Tachcardyia, chest pains tremors  . Tenormin [Atenolol] Rash   Objective:   Vitals:   01/01/19 1029  Resp: 16  Temp: (!) 96.7 F (35.9 C)   There is no height or weight on file to calculate BMI. Constitutional Well developed. Well nourished.  Vascular Dorsalis pedis pulses palpable bilaterally. Posterior tibial pulses palpable bilaterally. Capillary refill normal to all digits.  No cyanosis or clubbing noted. Pedal hair growth normal.  Neurologic Normal speech. Oriented to person, place, and time. Epicritic sensation to light touch grossly present bilaterally.  Dermatologic Nail removal site healing well right hallux. No open wounds. No skin lesions.  Orthopedic: POP bilateral 1st MPJ. Left with warmth local erythema   Radiographs: Taken and reviewed severe HAV bilateral marginal erosions noted proximal phalanx bilat Assessment:   1. Capsulitis of metatarsophalangeal (MTP) joint   2. Acute gout of left foot, unspecified cause   3. Acute gout involving toe of right foot, unspecified cause   4. Ingrown nail    Plan:  Patient was evaluated and treated and all questions answered.  Gout Bilateral Great Toe Joints -XR as above -Injection  delivered both 1st MPJ  Procedure: Joint Injection Location: Bilateral 1st MP joint Skin Prep: Alcohol. Injectate: 0.5 cc 1% lidocaine plain, 0.5 cc dexamethasone phosphate. Disposition: Patient tolerated procedure well. Injection site dressed with a band-aid.  Ingrown Nail R -Healing well without issue -No need to continue soaking   Return in about 3 weeks (around 01/22/2019) for Gout F/u bilateral great toes .

## 2019-01-08 ENCOUNTER — Other Ambulatory Visit: Payer: Self-pay | Admitting: Podiatry

## 2019-01-08 DIAGNOSIS — M109 Gout, unspecified: Secondary | ICD-10-CM

## 2019-01-08 DIAGNOSIS — M7750 Other enthesopathy of unspecified foot: Secondary | ICD-10-CM

## 2019-01-09 ENCOUNTER — Ambulatory Visit (INDEPENDENT_AMBULATORY_CARE_PROVIDER_SITE_OTHER): Payer: PPO | Admitting: Women's Health

## 2019-01-09 ENCOUNTER — Encounter: Payer: Self-pay | Admitting: Women's Health

## 2019-01-09 ENCOUNTER — Other Ambulatory Visit: Payer: Self-pay

## 2019-01-09 ENCOUNTER — Other Ambulatory Visit: Payer: PPO

## 2019-01-09 VITALS — BP 124/84

## 2019-01-09 DIAGNOSIS — B373 Candidiasis of vulva and vagina: Secondary | ICD-10-CM

## 2019-01-09 DIAGNOSIS — B3731 Acute candidiasis of vulva and vagina: Secondary | ICD-10-CM

## 2019-01-09 LAB — WET PREP FOR TRICH, YEAST, CLUE

## 2019-01-09 MED ORDER — TERCONAZOLE 0.4 % VA CREA: 1.0000 | TOPICAL_CREAM | Freq: Every day | VAGINAL | 0 refills | Status: DC

## 2019-01-09 NOTE — Progress Notes (Signed)
83 year old MWF G2, P2 presents with complaint of  brown vaginal discharge noted on panty liner and underwear for the past 2 weeks.  Denies vaginal itching/odor, urinary symptoms, abdominal/back pain or fever.  Having abdominal pain after eating with loose stools had a cholecystectomy 2 years ago and has had problems since does have follow-up scheduled with Dr. Cristina Gong.  1990 TAH for fibroids.  11/ 2019 diagnosed with left breast cancer had lumpectomy declined further treatment.  Medical problems include hypertension, CHF, hypothyroidism, arthritis, lichen sclerosis, had a TIA 07/2018.  Not sexually active.  Exam: Appears well.  Alert and oriented.  No CVAT.  Abdomen soft, nontender, external genitalia mild erythema and introitus, no visible lesions, blood or discharge noted, speculum exam atrophic, normal erythema, discharge white no visible blood or brown discharge, wet prep positive for yeast.  Yeast vaginitis  Plan: Terazol 7 1 applicator at bedtime x7, instructed to call if continued problems, reassurance given regarding hysterectomy status and brown discharge.  Yeast prevention discussed.

## 2019-01-09 NOTE — Patient Instructions (Signed)
Vaginal Yeast infection, Adult    Vaginal yeast infection is a condition that causes vaginal discharge as well as soreness, swelling, and redness (inflammation) of the vagina. This is a common condition. Some women get this infection frequently.  What are the causes?  This condition is caused by a change in the normal balance of the yeast (candida) and bacteria that live in the vagina. This change causes an overgrowth of yeast, which causes the inflammation.  What increases the risk?  The condition is more likely to develop in women who:   Take antibiotic medicines.   Have diabetes.   Take birth control pills.   Are pregnant.   Douche often.   Have a weak body defense system (immune system).   Have been taking steroid medicines for a long time.   Frequently wear tight clothing.  What are the signs or symptoms?  Symptoms of this condition include:   White, thick, creamy vaginal discharge.   Swelling, itching, redness, and irritation of the vagina. The lips of the vagina (vulva) may be affected as well.   Pain or a burning feeling while urinating.   Pain during sex.  How is this diagnosed?  This condition is diagnosed based on:   Your medical history.   A physical exam.   A pelvic exam. Your health care provider will examine a sample of your vaginal discharge under a microscope. Your health care provider may send this sample for testing to confirm the diagnosis.  How is this treated?  This condition is treated with medicine. Medicines may be over-the-counter or prescription. You may be told to use one or more of the following:   Medicine that is taken by mouth (orally).   Medicine that is applied as a cream (topically).   Medicine that is inserted directly into the vagina (suppository).  Follow these instructions at home:    Lifestyle   Do not have sex until your health care provider approves. Tell your sex partner that you have a yeast infection. That person should go to his or her health care  provider and ask if they should also be treated.   Do not wear tight clothes, such as pantyhose or tight pants.   Wear breathable cotton underwear.  General instructions   Take or apply over-the-counter and prescription medicines only as told by your health care provider.   Eat more yogurt. This may help to keep your yeast infection from returning.   Do not use tampons until your health care provider approves.   Try taking a sitz bath to help with discomfort. This is a warm water bath that is taken while you are sitting down. The water should only come up to your hips and should cover your buttocks. Do this 3-4 times per day or as told by your health care provider.   Do not douche.   If you have diabetes, keep your blood sugar levels under control.   Keep all follow-up visits as told by your health care provider. This is important.  Contact a health care provider if:   You have a fever.   Your symptoms go away and then return.   Your symptoms do not get better with treatment.   Your symptoms get worse.   You have new symptoms.   You develop blisters in or around your vagina.   You have blood coming from your vagina and it is not your menstrual period.   You develop pain in your abdomen.  Summary     Vaginal yeast infection is a condition that causes discharge as well as soreness, swelling, and redness (inflammation) of the vagina.   This condition is treated with medicine. Medicines may be over-the-counter or prescription.   Take or apply over-the-counter and prescription medicines only as told by your health care provider.   Do not douche. Do not have sex or use tampons until your health care provider approves.   Contact a health care provider if your symptoms do not get better with treatment or your symptoms go away and then return.  This information is not intended to replace advice given to you by your health care provider. Make sure you discuss any questions you have with your health care  provider.  Document Released: 04/20/2005 Document Revised: 11/27/2017 Document Reviewed: 11/27/2017  Elsevier Interactive Patient Education  2019 [employer]

## 2019-01-09 NOTE — Addendum Note (Signed)
Addended by: Nelva Nay on: 01/09/2019 12:53 PM   Modules accepted: Orders

## 2019-01-10 ENCOUNTER — Telehealth: Payer: Self-pay | Admitting: *Deleted

## 2019-01-10 MED ORDER — FLUCONAZOLE 100 MG PO TABS: ORAL_TABLET | ORAL | 0 refills | Status: DC

## 2019-01-10 NOTE — Telephone Encounter (Signed)
Patient was seen yesterday and asked if she can have diflucan tablets rather than Terazol cream? Patient aware you are out of the office. She asked for couple of tablet to keep "on hand". Please advise

## 2019-01-10 NOTE — Telephone Encounter (Signed)
Ok diflucan 100mg  take daily for 3 days and then as needed  #10.  Tell her a lower dose than reg dose so can use it differently

## 2019-01-22 ENCOUNTER — Ambulatory Visit: Payer: PPO | Admitting: Podiatry

## 2019-01-22 ENCOUNTER — Other Ambulatory Visit: Payer: Self-pay | Admitting: Podiatry

## 2019-01-22 DIAGNOSIS — R198 Other specified symptoms and signs involving the digestive system and abdomen: Secondary | ICD-10-CM | POA: Diagnosis not present

## 2019-01-22 DIAGNOSIS — M7750 Other enthesopathy of unspecified foot: Secondary | ICD-10-CM

## 2019-01-22 DIAGNOSIS — M109 Gout, unspecified: Secondary | ICD-10-CM

## 2019-01-29 ENCOUNTER — Ambulatory Visit: Payer: PPO | Admitting: Podiatry

## 2019-01-30 ENCOUNTER — Other Ambulatory Visit: Payer: PPO

## 2019-02-05 ENCOUNTER — Ambulatory Visit: Payer: PPO | Admitting: Podiatry

## 2019-02-08 ENCOUNTER — Ambulatory Visit
Admission: RE | Admit: 2019-02-08 | Discharge: 2019-02-08 | Disposition: A | Discharge status: Home / Self Care | Payer: PPO | Source: Ambulatory Visit | Attending: Physical Medicine and Rehabilitation | Admitting: Physical Medicine and Rehabilitation

## 2019-02-08 ENCOUNTER — Other Ambulatory Visit: Payer: Self-pay

## 2019-02-08 DIAGNOSIS — M542 Cervicalgia: Secondary | ICD-10-CM | POA: Diagnosis not present

## 2019-02-08 DIAGNOSIS — M5030 Other cervical disc degeneration, unspecified cervical region: Secondary | ICD-10-CM

## 2019-02-08 IMAGING — XA DG INJECT/[PERSON_NAME] INC NEEDLE/CATH/PLC EPI/CERV/THOR W/IMG
2 series · 2 of 2 positions shown · non-contrast
Comparison: none

CLINICAL DATA: Bilateral neck pain, left greater than right.
Cervical disc degeneration. The patient denies significant radicular
pain. Multilevel degenerative disc disease.

[Series 1: ortho standard · 1 of 1 slices shown (1 of 2)]
[im 1/1]
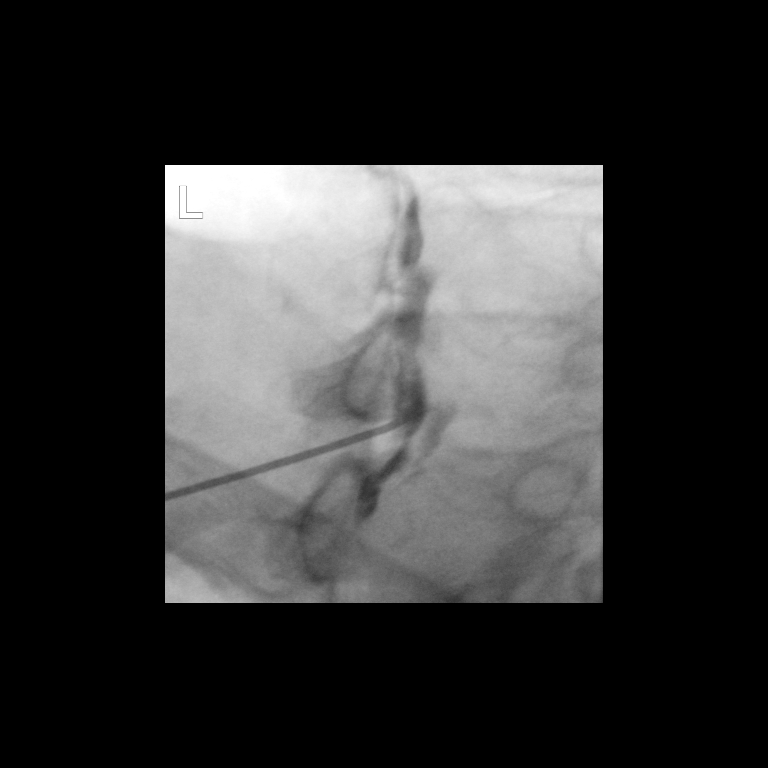

[Series 2: ortho standard · 1 of 1 slices shown (2 of 2)]
[im 1/1]
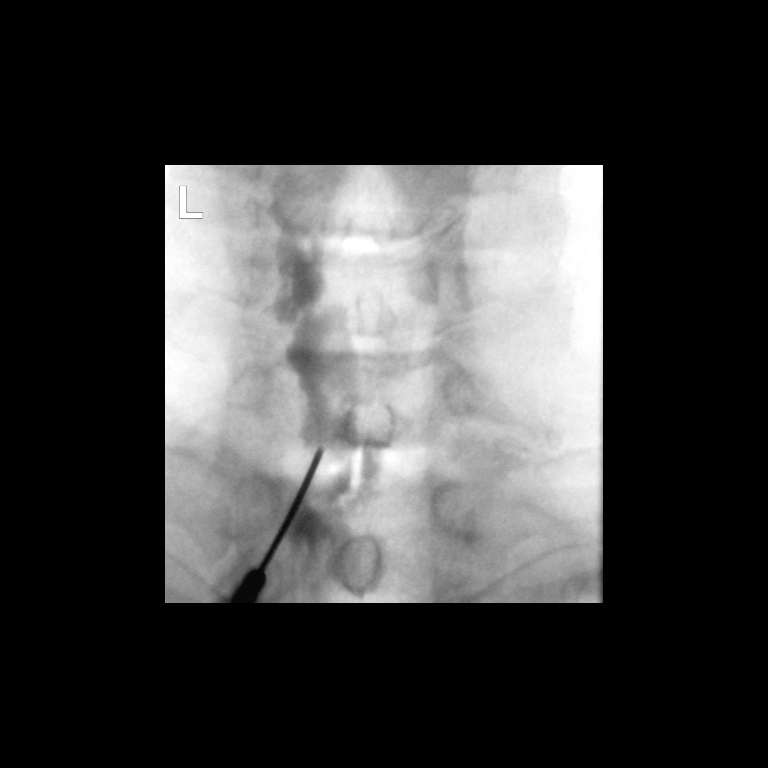

[2 of 2 positions shown; findings below may reference images not displayed]

FLUOROSCOPY TIME:  Radiation Exposure Index (as provided by the
fluoroscopic device): 6.84 uGy*m2

If the device does not provide the exposure index:

Fluoroscopy Time:  10 seconds

Number of Acquired Images:  0

PROCEDURE:
CERVICAL EPIDURAL INJECTION

An interlaminar approach was performed on the left at C7-T1. A 20
gauge epidural needle was advanced using loss-of-resistance
technique.

DIAGNOSTIC EPIDURAL INJECTION

Injection of Isovue-M 300 shows a good epidural pattern with spread
above and below the level of needle placement, primarily on the
left. No vascular opacification is seen. THERAPEUTIC

EPIDURAL INJECTION

1.5 ml of Kenalog 40 mixed with 1 ml of 1% Lidocaine and 2 ml of
normal saline were then instilled. The procedure was well-tolerated,
and the patient was discharged thirty minutes following the
injection in good condition.
IMPRESSION: Technically successful first epidural injection on the left at
C7-T1.

## 2019-02-08 MED ORDER — IOPAMIDOL (ISOVUE-M 300) INJECTION 61%
1.0000 mL | Freq: Once | INTRAMUSCULAR | Status: AC | PRN
  Administered 2019-02-08: 1 mL via EPIDURAL

## 2019-02-08 MED ORDER — METHYLPREDNISOLONE ACETATE 40 MG/ML INJ SUSP (RADIOLOG
120.0000 mg | Freq: Once | INTRAMUSCULAR | Status: AC
  Administered 2019-02-08: 120 mg via EPIDURAL

## 2019-02-08 NOTE — Discharge Instructions (Signed)

## 2019-02-18 ENCOUNTER — Other Ambulatory Visit: Payer: PPO

## 2019-02-20 DIAGNOSIS — L570 Actinic keratosis: Secondary | ICD-10-CM | POA: Diagnosis not present

## 2019-02-20 DIAGNOSIS — Z85828 Personal history of other malignant neoplasm of skin: Secondary | ICD-10-CM | POA: Diagnosis not present

## 2019-02-20 DIAGNOSIS — L850 Acquired ichthyosis: Secondary | ICD-10-CM | POA: Diagnosis not present

## 2019-02-26 ENCOUNTER — Ambulatory Visit: Payer: PPO | Admitting: Podiatry

## 2019-03-21 ENCOUNTER — Other Ambulatory Visit: Payer: Self-pay | Admitting: Physical Medicine and Rehabilitation

## 2019-03-21 DIAGNOSIS — M5030 Other cervical disc degeneration, unspecified cervical region: Secondary | ICD-10-CM

## 2019-03-27 ENCOUNTER — Other Ambulatory Visit: Payer: Self-pay

## 2019-03-27 ENCOUNTER — Encounter: Payer: Self-pay | Admitting: Cardiology

## 2019-03-27 ENCOUNTER — Ambulatory Visit (INDEPENDENT_AMBULATORY_CARE_PROVIDER_SITE_OTHER): Payer: PPO | Admitting: Cardiology

## 2019-03-27 VITALS — BP 197/98 | HR 84 | Temp 98.0°F | Ht 64.0 in | Wt 141.8 lb

## 2019-03-27 DIAGNOSIS — M159 Polyosteoarthritis, unspecified: Secondary | ICD-10-CM

## 2019-03-27 DIAGNOSIS — M150 Primary generalized (osteo)arthritis: Secondary | ICD-10-CM | POA: Diagnosis not present

## 2019-03-27 DIAGNOSIS — I480 Paroxysmal atrial fibrillation: Secondary | ICD-10-CM | POA: Diagnosis not present

## 2019-03-27 DIAGNOSIS — I10 Essential (primary) hypertension: Secondary | ICD-10-CM | POA: Diagnosis not present

## 2019-03-27 DIAGNOSIS — I951 Orthostatic hypotension: Secondary | ICD-10-CM

## 2019-03-27 DIAGNOSIS — M8949 Other hypertrophic osteoarthropathy, multiple sites: Secondary | ICD-10-CM

## 2019-03-27 DIAGNOSIS — I5032 Chronic diastolic (congestive) heart failure: Secondary | ICD-10-CM | POA: Diagnosis not present

## 2019-03-27 NOTE — Progress Notes (Signed)
Primary Physician:  [name], MD   Patient ID: Heidi Adams, female    DOB: Jun 04, 1936, 83 y.o.   MRN: :[id number]  Subjective:    Chief Complaint  Patient presents with   Hypertension    6 month f/u   Atrial Fibrillation    HPI: Heidi Adams  is a 83 y.o. female  with no significant prior cardiac history except for bilateral hip replacement and knee replacement and left breast lumpectomy in 2018 without chemotherapy or radiation therapy for breast cancer and also remote hysterectomy due to uterine cancer in 1991, supine hypertension and orthostatic hypotension. She has chronic dizziness on standing. She has had one episode of atrial fibrillation when she presented with marked dizziness to [facility] in 2017 and converted to sinus rhythm on Amiodarone  Patient had ?TIA, on 07/30/2018, she did not have any neurologic deficits, CT scan and MRI did not reveal any acute infarct. Carotid artery duplex did not reveal any significant stenosis. She was found to have severely elevated blood pressure but she was also orthostatic. She is intolerant to multiple medications and has stopped all meds except taking hydralazine PRN for SBP >160 mm Hg.  She was supposed to be taking Nadolol after her last office visit, but decided not to take this as she has been monitoring her BP at home and it has overall been normal.   She does have chronic joint pain and is wanting to stop Eliquis so that she can take Ibuprofen daily. She states that she has not had any atrial fibrillation that is clearly documented. Except for chronic dizziness, dyspnea, no new symptoms.  Past Medical History:  Diagnosis Date   Allergy    Anemia    hx of anemia   Arthritis    Breast cancer ([place])    Left breast   Cancer ([place])    skin ca nose   Chronic combined systolic and diastolic CHF (congestive heart failure) ([place]) 09/22/2018   Chronic combined systolic and diastolic CHF (congestive  heart failure) ([place]) 09/22/2018   Chronic combined systolic and diastolic CHF (congestive heart failure) ([place]) 09/22/2018   Dysrhythmia    went into Atrial Fibrillation after last surgery, HAS PALPITATIONS   History of skin cancer    Hypertension    Hypothyroidism    PVC (premature ventricular contraction)    Thyroiditis 1973    Past Surgical History:  Procedure Laterality Date   ABDOMINAL HYSTERECTOMY  90   TAH BSO   APPENDECTOMY     BREAST LUMPECTOMY Left 05/30/2017   BREAST LUMPECTOMY WITH RADIOACTIVE SEED LOCALIZATION Left 05/30/2017   Procedure: LEFT BREAST LUMPECTOMY WITH RADIOACTIVE SEED LOCALIZATION;  Surgeon: Excell Seltzer, MD;  Location: [facility];  Service: General;  Laterality: Left;   CATARACT EXTRACTION  09/10/2018   Right eye   CHOLECYSTECTOMY     dr Georgia Lopes   ELBOW SURGERY     EYE SURGERY     "on my eyelids" years ago   KNEE ARTHROSCOPY     left   THYROIDECTOMY  1973   TONSILLECTOMY     TOTAL HIP ARTHROPLASTY Right 05/10/2016   Procedure: TOTAL HIP ARTHROPLASTY ANTERIOR APPROACH;  Surgeon: Paralee Cancel, MD;  Location: [facility];  Service: Orthopedics;  Laterality: Right;   TOTAL HIP ARTHROPLASTY Left 01/10/2017   Procedure: LEFT TOTAL HIP ARTHROPLASTY ANTERIOR APPROACH;  Surgeon: Paralee Cancel, MD;  Location: [facility];  Service: Orthopedics;  Laterality: Left;   TOTAL KNEE  ARTHROPLASTY Left 09/28/2015   Procedure: LEFT TOTAL KNEE ARTHROPLASTY;  Surgeon: Paralee Cancel, MD;  Location: [facility];  Service: Orthopedics;  Laterality: Left;   TOTAL KNEE ARTHROPLASTY Right 08/09/2016   Procedure: RIGHT TOTAL KNEE ARTHROPLASTY;  Surgeon: Paralee Cancel, MD;  Location: [facility];  Service: Orthopedics;  Laterality: Right;  Adductor Block   Total Left hip arthroplasty     01/10/17 Dr. Alvan Dame    Social History   Socioeconomic History   Marital status: Married    Spouse name: Not on file   Number of children: 2   Years of education: Not on  file   Highest education level: Not on file  Occupational History   Not on file  Social Needs   Financial resource strain: Not on file   Food insecurity    Worry: Not on file    Inability: Not on file   Transportation needs    Medical: Not on file    Non-medical: Not on file  Tobacco Use   Smoking status: Former Smoker    Packs/day: 1.00    Years: 20.00    Pack years: 20.00    Types: Cigarettes    Quit date: 07/26/1987    Years since quitting: 31.6   Smokeless tobacco: Never Used  Substance and Sexual Activity   Alcohol use: No    Alcohol/week: 0.0 standard drinks   Drug use: No   Sexual activity: Not Currently    Comment: 1st intercourse 81 yo-1 partner  Lifestyle   Physical activity    Days per week: Not on file    Minutes per session: Not on file   Stress: Not on file  Relationships   Social connections    Talks on phone: Not on file    Gets together: Not on file    Attends religious service: Not on file    Active member of club or organization: Not on file    Attends meetings of clubs or organizations: Not on file    Relationship status: Not on file   Intimate partner violence    Fear of current or ex partner: Not on file    Emotionally abused: Not on file    Physically abused: Not on file    Forced sexual activity: Not on file  Other Topics Concern   Not on file  Social History Narrative   Not on file    Review of Systems  Constitution: Positive for malaise/fatigue. Negative for decreased appetite, weight gain and weight loss.  Eyes: Negative for visual disturbance.  Cardiovascular: Positive for dyspnea on exertion (mild) and leg swelling (intermittent, improved with support stockings). Negative for chest pain, claudication, orthopnea, palpitations and syncope.  Respiratory: Negative for hemoptysis and wheezing.   Endocrine: Negative for cold intolerance and heat intolerance.  Hematologic/Lymphatic: Does not bruise/bleed easily.  Skin:  Negative for nail changes.  Musculoskeletal: Positive for joint pain. Negative for muscle weakness and myalgias.  Gastrointestinal: Negative for abdominal pain, change in bowel habit, nausea and vomiting.  Neurological: Negative for difficulty with concentration, dizziness, focal weakness and headaches.  Psychiatric/Behavioral: Negative for altered mental status and suicidal ideas.  All other systems reviewed and are negative.     Objective:  Blood pressure (!) 216/105, pulse 84, temperature 98 F (36.7 C), height 5\' 4"  (1.626 m), weight 141 lb 12.8 oz (64.3 kg), last menstrual period 07/25/1988, SpO2 96 %. Body mass index is 24.34 kg/m.    BP supine 205/99 BP sitting 194/97 BP standing 189/94  Rechecked BP sitting: 172/84  Physical Exam  Constitutional: She is oriented to person, place, and time. Vital signs are normal. She appears well-developed and well-nourished.  HENT:  Head: Normocephalic and atraumatic.  Neck: Normal range of motion.  Cardiovascular: Normal rate, regular rhythm, normal heart sounds and intact distal pulses.  Pulmonary/Chest: Effort normal and breath sounds normal. No accessory muscle usage. No respiratory distress.  Abdominal: Soft. Bowel sounds are normal.  Musculoskeletal: Normal range of motion.  Neurological: She is alert and oriented to person, place, and time.  Skin: Skin is warm and dry.  Vitals reviewed.  Radiology: No results found.  Laboratory examination:    CMP Latest Ref Rng & Units 07/31/2018 07/30/2018 07/30/2018  Glucose 70 - 99 mg/dL 97 95 94  BUN 8 - 23 mg/dL 20 14 16   Creatinine 0.44 - 1.00 mg/dL 0.80 0.60 0.69  Sodium 135 - 145 mmol/L 140 138 140  Potassium 3.5 - 5.1 mmol/L 3.6 3.7 3.7  Chloride 98 - 111 mmol/L 108 107 108  CO2 22 - 32 mmol/L 22 - 24  Calcium 8.9 - 10.3 mg/dL 9.0 - 8.9  Total Protein 6.5 - 8.1 g/dL - - 6.3(L)  Total Bilirubin 0.3 - 1.2 mg/dL - - 0.7  Alkaline Phos 38 - 126 U/L - - 75  AST 15 - 41 U/L - - 12(L)    ALT 0 - 44 U/L - - 10   CBC Latest Ref Rng & Units 07/31/2018 07/30/2018 07/30/2018  WBC 4.0 - 10.5 K/uL 9.1 - 7.1  Hemoglobin 12.0 - 15.0 g/dL 13.2 13.3 12.7  Hematocrit 36.0 - 46.0 % 42.8 39.0 40.6  Platelets 150 - 400 K/uL 271 - 254   Lipid Panel     Component Value Date/Time   CHOL 142 07/31/2018 0655   TRIG 72 07/31/2018 0655   HDL 45 07/31/2018 0655   CHOLHDL 3.2 07/31/2018 0655   VLDL 14 07/31/2018 0655   LDLCALC 83 07/31/2018 0655   HEMOGLOBIN A1C Lab Results  Component Value Date   HGBA1C 5.0 07/31/2018   MPG 96.8 07/31/2018   TSH No results for input(s): TSH in the last 8760 hours.  PRN Meds:. There are no discontinued medications. Current Meds  Medication Sig   acetaminophen (TYLENOL) 500 MG tablet Take 1,000 mg by mouth 3 (three) times daily as needed for moderate pain.    apixaban (ELIQUIS) 5 MG TABS tablet Take 5 mg by mouth 2 (two) times daily.   diclofenac sodium (VOLTAREN) 1 % GEL    levothyroxine (SYNTHROID, LEVOTHROID) 112 MCG tablet Take 112 mcg by mouth daily before breakfast.   lidocaine (LMX) 4 % cream Apply 1 application topically as needed.   Misc Natural Products (GLUCOSAMINE CHOND MSM FORMULA PO) Take 2 tablets by mouth daily.   pantoprazole (PROTONIX) 40 MG tablet Take 40 mg by mouth daily as needed (indigestion).    Sennosides-Docusate Sodium (SENOKOT S PO) Take 1 tablet by mouth as needed.    Cardiac Studies:   Echocardiogram  07/30/2018 - Left ventricle: The cavity size was normal. There was moderate concentric hypertrophy. Systolic function assessment limited due to frequent PAC&'s. LVEF probably around 50%. Wall motion was normal; there were no regional wall motion abnormalities. Doppler parameters are consistent with abnormal left ventricular relaxation (grade 1 diastolic dysfunction). - Aortic valve: Mildly calcified annulus. Trileaflet valve. There was moderate regurgitation.  Echocardiogram 03/21/2018: Left  ventricle cavity is normal in size. Moderate concentric hypertrophy of the left ventricle. Moderate decrease in  global wall motion. Doppler evidence of grade II (pseudonormal) diastolic dysfunction, elevated LAP. Calculated EF 30%. Left atrial cavity is normal in size. Aneurysmal interatrial septum without PFO. Moderate (Grade II) aortic regurgitation. Mild mitral valve leaflet calcification. Study is technically inadequate to assess for mitral stenosis.  Moderate (Grade II) mitral regurgitation. Mild tricuspid regurgitation. Estimated pulmonary artery systolic pressure 24  mmHg. Mild pulmonic regurgitation. Insignificant pericardial effusion. IVC is dilated with respiratory variation. Estimated RA pressure 8 mmHg.  Lexiscan Myoview stress test  04/23/2018: 1. The resting electrocardiogram demonstrated normal sinus rhythm, normal resting conduction and no resting arrhythmias.  Stress EKG is non-diagnostic for ischemia as it a pharmacologic stress using Lexiscan. Occasional PAC and PVC. The patient developed significant symptoms which included Chest Pain, Stomach pain, Headache.  2. The overall quality of the study is good. There is no evidence of abnormal lung activity. Stress and rest SPECT images demonstrate homogeneous tracer distribution throughout the myocardium. Gated SPECT imaging reveals diffuse global hypokinesis. The left ventricular ejection fraction was markedly depressed at (26%).   3. This is a high risk study due to severe LV systolic dysfunction. Findings may represent non ischemic cardiomyopathy.  Assessment:   Paroxysmal atrial fibrillation (HCC) - Plan: EKG [id number]  Chronic diastolic CHF (congestive heart failure) (HCC)  Orthostatic hypotension  Essential hypertension  EKG 03/27/2019: Normal sinus rhythm at 87 bpm, 1 PAC, left axis deviation, PRWP cannot exclude anteroseptal infarct old. No evidence of ischemia.   CHA2DS2-VASc Score is 6.  -(CHF; HTN; vasc disease DM,   Female = 1; Age <65 =0; 65-74 = 1,  >75 =2; stroke = 2).  -(Yearly risk of stroke: Score of 1=1.3; 2=2.2; 3=3.2; 4=4; 5=6.7; 6=9.8; 7=>9.8)  Recommendations:   Patient is here for 2-month follow-up.  She is presently doing well without any significant complaints.  Has minimal leg edema that has improved with use of support stockings.  She uses hydrochlorothiazide as an as-needed basis.  She is careful with her diet to help with fluid retention as well.  She does have orthostatic hypotension with supine hypertension.  I have reviewed her home blood pressure logs that show frequent systolic readings in the [id number].  She is noted to have heart rate consistently in the 90s.  Continue to feel that she would benefit from nadolol, but she is very reluctant to starting any other medications.  I have asked her to try half a tablet of her previous prescription of nadolol to see if she will tolerate and can help with occasional spikes in her blood pressure and also her heart rate and also given her history of atrial fibrillation.  She continues to deny any history of A. fib; however, there is documented A. fib on ER visit in 2017 at [facility].  She is high risk for cardioembolic event and continue to feel that she should be on anticoagulation.  I discussed risk and benefits of anticoagulation.  She is wanting to stop so that she can take ibuprofen daily for her osteoarthritis.  I have asked her to use diclofenac gel and only occasional use of ibuprofen to see if this will help.  If she continues to have significant lifestyle limiting joint pain, we will have to make a decision on her anticoagulation.  I will also discuss with Dr. [name] regarding this.  Overall, patient is doing well.  We will see her back in 6 months or sooner if needed.  Miquel Dunn, MSN, APRN, FNP-C [facility] [place] Office:  [phone] Fax: [phone]

## 2019-03-28 ENCOUNTER — Other Ambulatory Visit: Payer: PPO

## 2019-04-05 ENCOUNTER — Ambulatory Visit
Admission: RE | Admit: 2019-04-05 | Discharge: 2019-04-05 | Disposition: A | Discharge status: Home / Self Care | Payer: PPO | Source: Ambulatory Visit | Attending: Physical Medicine and Rehabilitation | Admitting: Physical Medicine and Rehabilitation

## 2019-04-05 ENCOUNTER — Other Ambulatory Visit: Payer: Self-pay

## 2019-04-05 DIAGNOSIS — M5030 Other cervical disc degeneration, unspecified cervical region: Secondary | ICD-10-CM | POA: Diagnosis not present

## 2019-04-05 IMAGING — XA DG INJECT/[PERSON_NAME] INC NEEDLE/CATH/PLC EPI/CERV/THOR W/IMG
2 series · 2 of 2 positions shown · non-contrast
Comparison: none

CLINICAL DATA: Cervical disc degeneration. Bilateral neck pain
extending to the shoulders. Symptoms are worse on the left. She had
significant relief of pain from the prior injection. There has been
some recurrent.

[Series 1: ortho standard · 1 of 1 slices shown (1 of 2)]
[im 1/1]
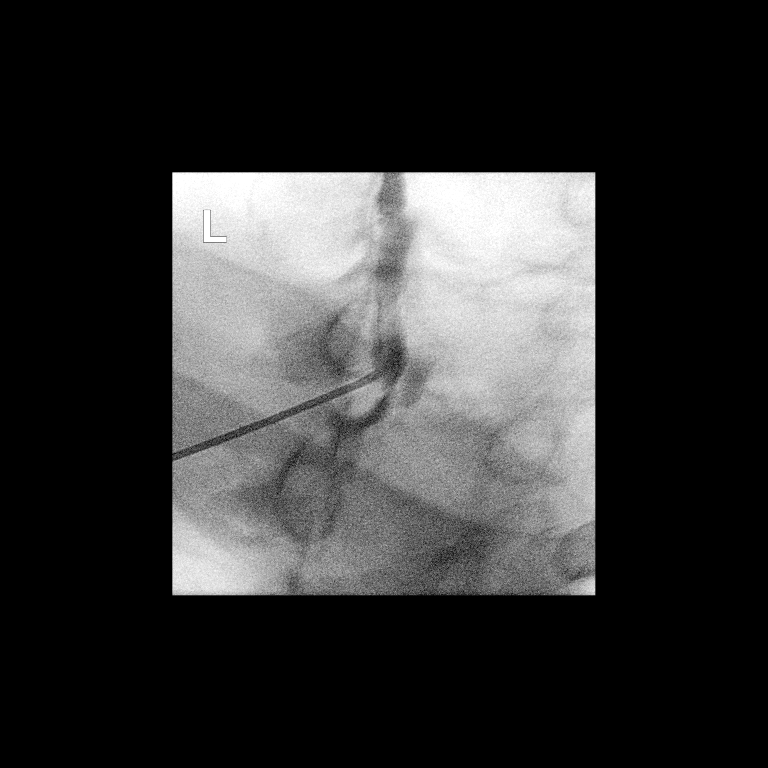

[Series 2: ortho standard · 1 of 1 slices shown (2 of 2)]
[im 1/1]
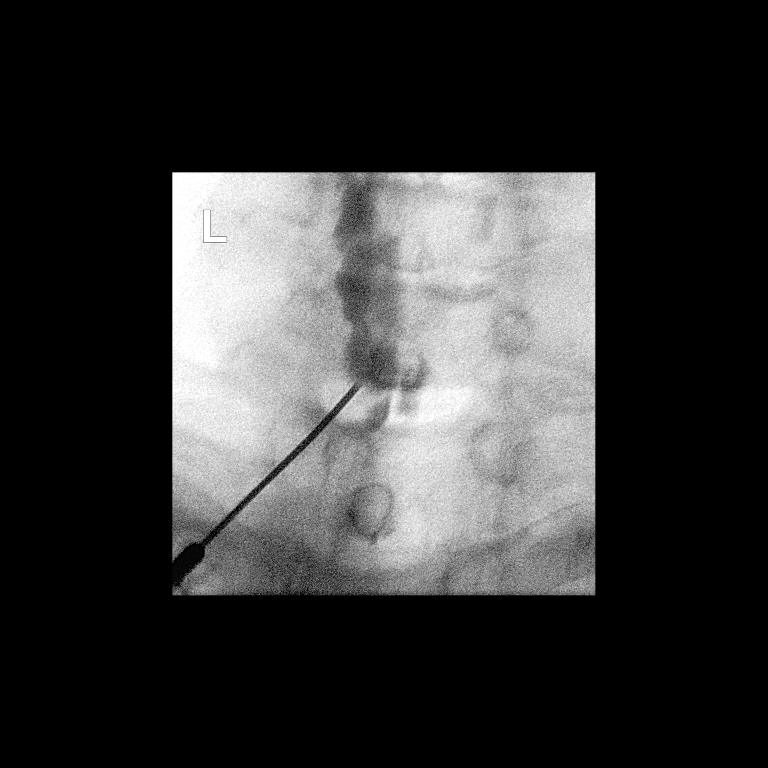

[2 of 2 positions shown; findings below may reference images not displayed]

FLUOROSCOPY TIME:  Radiation Exposure Index (as provided by the
fluoroscopic device): 3.03 uGy*m2

PROCEDURE:
CERVICAL EPIDURAL INJECTION

An interlaminar approach was performed on the left at C7-T1. A 20
gauge epidural needle was advanced using loss-of-resistance
technique.

DIAGNOSTIC EPIDURAL INJECTION

Injection of Isovue-M 300 shows a good epidural pattern with spread
above and below the level of needle placement, primarily on the
left. No vascular opacification is seen. THERAPEUTIC

EPIDURAL INJECTION

1.5 ml of Kenalog 40 mixed with 1 ml of 1% Lidocaine and 2 ml of
normal saline were then instilled. The procedure was well-tolerated,
and the patient was discharged thirty minutes following the
injection in good condition.
IMPRESSION: Technically successful seconds epidural injection on the left at
C7-T1.

## 2019-04-05 MED ORDER — IOPAMIDOL (ISOVUE-M 300) INJECTION 61%
1.0000 mL | Freq: Once | INTRAMUSCULAR | Status: AC | PRN
  Administered 2019-04-05: 1 mL via EPIDURAL

## 2019-04-05 MED ORDER — TRIAMCINOLONE ACETONIDE 40 MG/ML IJ SUSP (RADIOLOGY)
60.0000 mg | Freq: Once | INTRAMUSCULAR | Status: AC
  Administered 2019-04-05: 60 mg via EPIDURAL

## 2019-04-05 NOTE — Discharge Instructions (Signed)

## 2019-04-09 DIAGNOSIS — E038 Other specified hypothyroidism: Secondary | ICD-10-CM | POA: Diagnosis not present

## 2019-04-09 DIAGNOSIS — Z23 Encounter for immunization: Secondary | ICD-10-CM | POA: Diagnosis not present

## 2019-04-09 DIAGNOSIS — E7849 Other hyperlipidemia: Secondary | ICD-10-CM | POA: Diagnosis not present

## 2019-04-10 ENCOUNTER — Other Ambulatory Visit: Payer: Self-pay

## 2019-04-11 ENCOUNTER — Telehealth: Payer: Self-pay

## 2019-04-11 NOTE — Telephone Encounter (Signed)
-----   Message from Miquel Dunn, NP sent at 04/05/2019  3:59 PM EDT ----- Regarding: FW: anticoagulation Please call patient and let her know that I had discussed with Dr. [name] and that we recommend that she stay on anticoagulation. Would recommend trying non NSAID product like Ultram. See below for details.  ----- Message ----- From: Adrian Prows, MD Sent: 03/29/2019   5:43 PM EDT To: Miquel Dunn, NP Subject: RE: anticoagulation                            I would recommend she try Ultram or any other non NSAID product. Risk of stroke too high to stop anticoagulation ----- Message ----- From: Miquel Dunn, NP Sent: 03/27/2019   3:52 PM EDT To: Adrian Prows, MD Subject: anticoagulation                                I saw this patient today and she is wanting to stop her Eliquis so that she can take ibuprofen daily for her arthritis that she states is limiting her lifestyle. I advised dicolfenac gel and only as needed iburprofen, because I feel that she should be on anticoagulation. She wanted me to discuss with you to see what you thought and see if you had any recommendations. Nothing urgent, we can discuss later.  Thanks, AK

## 2019-04-11 NOTE — Telephone Encounter (Signed)
LM to inform patient not to stop anticoagulation. She may take Ultram for pain.

## 2019-04-12 ENCOUNTER — Telehealth: Payer: Self-pay

## 2019-04-12 NOTE — Telephone Encounter (Signed)
Pt called states she hasnt been taking bp meds since jan and when she was in here last you told her start her Nadalol and now her bp is high with sysolic being [id number] so she is going to stop

## 2019-04-12 NOTE — Telephone Encounter (Signed)
Patient with orthostatic hypotension, does well without antihypertensive medications, is tolerating hydralazine, wants to discontinue nadolol as it causes her to have much increased blood pressure than the other way around and does not feel well.  Advised her to stop the medication.

## 2019-04-15 ENCOUNTER — Encounter: Payer: Self-pay | Admitting: Cardiology

## 2019-04-15 DIAGNOSIS — E669 Obesity, unspecified: Secondary | ICD-10-CM | POA: Diagnosis not present

## 2019-04-15 DIAGNOSIS — C50919 Malignant neoplasm of unspecified site of unspecified female breast: Secondary | ICD-10-CM | POA: Diagnosis not present

## 2019-04-15 DIAGNOSIS — K5900 Constipation, unspecified: Secondary | ICD-10-CM | POA: Diagnosis not present

## 2019-04-15 DIAGNOSIS — I420 Dilated cardiomyopathy: Secondary | ICD-10-CM | POA: Diagnosis not present

## 2019-04-15 DIAGNOSIS — I10 Essential (primary) hypertension: Secondary | ICD-10-CM | POA: Diagnosis not present

## 2019-04-15 DIAGNOSIS — K219 Gastro-esophageal reflux disease without esophagitis: Secondary | ICD-10-CM | POA: Diagnosis not present

## 2019-04-15 DIAGNOSIS — I480 Paroxysmal atrial fibrillation: Secondary | ICD-10-CM | POA: Diagnosis not present

## 2019-04-15 DIAGNOSIS — Z0000 Encounter for general adult medical examination without abnormal findings: Secondary | ICD-10-CM | POA: Diagnosis not present

## 2019-04-15 DIAGNOSIS — K911 Postgastric surgery syndromes: Secondary | ICD-10-CM | POA: Diagnosis not present

## 2019-04-15 DIAGNOSIS — M1990 Unspecified osteoarthritis, unspecified site: Secondary | ICD-10-CM | POA: Diagnosis not present

## 2019-04-16 ENCOUNTER — Ambulatory Visit (INDEPENDENT_AMBULATORY_CARE_PROVIDER_SITE_OTHER): Payer: PPO | Admitting: Podiatry

## 2019-04-16 ENCOUNTER — Ambulatory Visit: Payer: PPO | Admitting: Podiatry

## 2019-04-16 ENCOUNTER — Other Ambulatory Visit: Payer: Self-pay

## 2019-04-16 DIAGNOSIS — L600 Ingrowing nail: Secondary | ICD-10-CM | POA: Diagnosis not present

## 2019-04-16 DIAGNOSIS — M79676 Pain in unspecified toe(s): Secondary | ICD-10-CM

## 2019-04-16 MED ORDER — NEOMYCIN-POLYMYXIN-HC 3.5-10000-1 OT SOLN: OTIC | 0 refills | Status: DC

## 2019-04-16 NOTE — Patient Instructions (Signed)

## 2019-04-16 NOTE — Progress Notes (Signed)
Subjective:  Patient ID: Heidi Adams, female    DOB: 1936-02-05,  MRN: [id number]  Chief Complaint  Patient presents with  . Nail Problem    Lt hallux medial border x [place] months; 8-10/10 throbbing pain -pt denies injury/swellign -w/ redness -pt denies drianage Tx: tyleol     83 y.o. female presents with the above complaint. Hx as above. Right side doing very well.  Review of Systems: Negative except as noted in the HPI. Denies N/V/F/Ch.  Past Medical History:  Diagnosis Date  . Allergy   . Anemia    hx of anemia  . Arthritis   . Breast cancer ([place])    Left breast  . Cancer ([place])    skin ca nose  . Chronic combined systolic and diastolic CHF (congestive heart failure) ([place]) 09/22/2018  . Chronic combined systolic and diastolic CHF (congestive heart failure) ([place]) 09/22/2018  . Chronic combined systolic and diastolic CHF (congestive heart failure) ([place]) 09/22/2018  . Dysrhythmia    went into Atrial Fibrillation after last surgery, HAS PALPITATIONS  . History of skin cancer   . Hypertension   . Hypothyroidism   . PVC (premature ventricular contraction)   . Thyroiditis 1973    Current Outpatient Medications:  .  acetaminophen (TYLENOL) 500 MG tablet, Take 1,000 mg by mouth 3 (three) times daily as needed for moderate pain. , Disp: , Rfl:  .  apixaban (ELIQUIS) 5 MG TABS tablet, Take 5 mg by mouth 2 (two) times daily., Disp: , Rfl:  .  diclofenac sodium (VOLTAREN) 1 % GEL, , Disp: , Rfl:  .  fluconazole (DIFLUCAN) 100 MG tablet, Take one tab po daily x 3 days and then take one tab po daily as needed. (Patient not taking: Reported on 03/27/2019), Disp: 10 tablet, Rfl: 0 .  hydrALAZINE (APRESOLINE) 25 MG tablet, Take 1 tablet (25 mg total) by mouth daily with breakfast. (Patient not taking: Reported on 03/27/2019), Disp: 60 tablet, Rfl: 0 .  levothyroxine (SYNTHROID, LEVOTHROID) 112 MCG tablet, Take 112 mcg by mouth daily before breakfast., Disp: , Rfl:  .  lidocaine (LMX) 4 % cream,  Apply 1 application topically as needed., Disp: , Rfl:  .  Misc Natural Products (GLUCOSAMINE CHOND MSM FORMULA PO), Take 2 tablets by mouth daily., Disp: , Rfl:  .  nadolol (CORGARD) 20 MG tablet, Take 1 tablet (20 mg total) by mouth daily. (Patient not taking: Reported on 03/27/2019), Disp: 90 tablet, Rfl: 1 .  pantoprazole (PROTONIX) 40 MG tablet, Take 40 mg by mouth daily as needed (indigestion). , Disp: , Rfl:  .  Sennosides-Docusate Sodium (SENOKOT S PO), Take 1 tablet by mouth as needed., Disp: , Rfl:  .  terconazole (TERAZOL 7) 0.4 % vaginal cream, Place 1 applicator vaginally at bedtime., Disp: 45 g, Rfl: 0  Social History   Tobacco Use  Smoking Status Former Smoker  . Packs/day: 1.00  . Years: 20.00  . Pack years: 20.00  . Types: Cigarettes  . Quit date: 07/26/1987  . Years since quitting: 31.7  Smokeless Tobacco Never Used    Allergies  Allergen Reactions  . Codeine Nausea Only  . Epinephrine     Tachcardyia, chest pains tremors  . Tenormin [Atenolol] Rash   Objective:  There were no vitals filed for this visit. There is no height or weight on file to calculate BMI. Constitutional Well developed. Well nourished.  Vascular Dorsalis pedis pulses palpable bilaterally. Posterior tibial pulses palpable bilaterally. Capillary refill normal to all  digits.  No cyanosis or clubbing noted. Pedal hair growth normal.  Neurologic Normal speech. Oriented to person, place, and time. Epicritic sensation to light touch grossly present bilaterally.  Dermatologic Painful ingrowing nail at medial nail borders of the hallux nail left. No other open wounds. No skin lesions.  Orthopedic: Normal joint ROM without pain or crepitus bilaterally. No visible deformities. No bony tenderness.   Radiographs: None Assessment:   1. Ingrown nail   2. Pain around toenail    Plan:  Patient was evaluated and treated and all questions answered.  Ingrown Nail, left -Patient elects to proceed  with minor surgery to remove ingrown toenail removal today. Consent reviewed and signed by patient. -Ingrown nail excised. See procedure note. -Educated on post-procedure care including soaking. Written instructions provided and reviewed. -Patient to follow up in 2 weeks for nail check.  Procedure: Avulsion of toenail Location: Left 1st toe medial border Anesthesia: Lidocaine 1% plain; 1.5 mL and Marcaine 0.5% plain; 1.5 mL, digital block. Skin Prep: Betadine. Dressing: Silvadene; telfa; dry, sterile, compression dressing. Technique: Following skin prep, the toe was exsanguinated and a tourniquet was secured at the base of the toe. The nail border was freed and avulsed with a hemostat. The area was cleansed. The tourniquet was then removed and sterile dressing applied. Disposition: Patient tolerated procedure well.   Return in about 2 weeks (around 04/30/2019).

## 2019-04-18 ENCOUNTER — Other Ambulatory Visit: Payer: Self-pay | Admitting: Cardiology

## 2019-04-25 DIAGNOSIS — R82998 Other abnormal findings in urine: Secondary | ICD-10-CM | POA: Diagnosis not present

## 2019-04-25 DIAGNOSIS — I10 Essential (primary) hypertension: Secondary | ICD-10-CM | POA: Diagnosis not present

## 2019-04-29 ENCOUNTER — Other Ambulatory Visit: Payer: Self-pay

## 2019-04-29 ENCOUNTER — Ambulatory Visit (INDEPENDENT_AMBULATORY_CARE_PROVIDER_SITE_OTHER): Payer: PPO | Admitting: Podiatry

## 2019-04-29 DIAGNOSIS — M79676 Pain in unspecified toe(s): Secondary | ICD-10-CM | POA: Diagnosis not present

## 2019-04-29 DIAGNOSIS — L600 Ingrowing nail: Secondary | ICD-10-CM | POA: Diagnosis not present

## 2019-04-29 NOTE — Progress Notes (Signed)
  Subjective:  Patient ID: Heidi Adams, female    DOB: 1936-03-14,  MRN: [id number]  Chief Complaint  Patient presents with  . nail checki    F/U Lt nail check Pt. states," it's healing up but now the other sie of the nail is hurting, so it's not any better." Tx: epsom  -w/ bruised below nail bed, redness -pt denies swelling and drainage   83 y.o. female returns for the above complaint.   Objective:   General AA&O x3. Normal mood and affect.  Vascular Foot warm and well perfused with good capillary refill.  Neurologic Sensation grossly intact.  Dermatologic Nail avulsion site healing well without drainage or erythema. Nail bed with overlying soft crust. Left intact. No signs of local infection. Ingrowing nail left medial nail border.  Orthopedic: No tenderness to palpation of the toe.   Assessment & Plan:  Patient was evaluated and treated and all questions answered.  S/p Ingrown Toenail Excision, left -Healing well without issue. -Medial border gently trimmed of ingrowing nail, does not wish this border excised at this time. -Discussed return precautions. -F/u PRN

## 2019-04-30 ENCOUNTER — Ambulatory Visit: Payer: PPO | Admitting: Podiatry

## 2019-05-02 ENCOUNTER — Encounter: Payer: Self-pay | Admitting: Gynecology

## 2019-05-02 DIAGNOSIS — M79641 Pain in right hand: Secondary | ICD-10-CM | POA: Diagnosis not present

## 2019-05-02 DIAGNOSIS — M25522 Pain in left elbow: Secondary | ICD-10-CM | POA: Diagnosis not present

## 2019-05-02 DIAGNOSIS — M19022 Primary osteoarthritis, left elbow: Secondary | ICD-10-CM | POA: Diagnosis not present

## 2019-05-02 DIAGNOSIS — M13831 Other specified arthritis, right wrist: Secondary | ICD-10-CM | POA: Diagnosis not present

## 2019-05-05 DIAGNOSIS — M19039 Primary osteoarthritis, unspecified wrist: Secondary | ICD-10-CM | POA: Insufficient documentation

## 2019-05-14 ENCOUNTER — Telehealth: Payer: Self-pay

## 2019-05-14 NOTE — Telephone Encounter (Signed)
Pt called for samples and I gave her 3 weeks worth

## 2019-05-20 ENCOUNTER — Telehealth: Payer: Self-pay | Admitting: Hematology and Oncology

## 2019-05-20 NOTE — Telephone Encounter (Signed)
Call day 11/4. Appointment moved from 11/4 to 11/13. Confirmed with patient.

## 2019-05-29 ENCOUNTER — Ambulatory Visit: Payer: PPO | Admitting: Hematology and Oncology

## 2019-06-06 NOTE — Progress Notes (Signed)
Patient Care Team: [name], MD as PCP - General (Internal Medicine)  DIAGNOSIS:    ICD-10-CM   1. Malignant neoplasm of upper-inner quadrant of left breast in female, estrogen receptor positive ([place])  C50.212    Z17.0     SUMMARY OF ONCOLOGIC HISTORY: Oncology History  Malignant neoplasm of upper-inner quadrant of left breast in female, estrogen receptor positive ([place])  05/15/2017 Initial Diagnosis   Malignant neoplasm of upper-inner quadrant of left breast in female, estrogen receptor positive ([place]): Microscopic focus suspicious for IDC low-grade   05/30/2017 Surgery   Left lumpectomy: IDC grade 1, 0.8 cm, with low-grade DCIS, ER 100%, PR 50%, Ki-67 5%, HER-2 negative ratio 1.5, T1BN0 stage I a    Miscellaneous   Refused radiation and Anti-estrogen therapy     CHIEF COMPLIANT: Surveillance of breast cancer  INTERVAL HISTORY: Heidi Adams is a 83 y.o. with above-mentioned history of left breast cancer treated with lumpectomy, she refused radiation, and she refused antiestrogen therapy. She is currently on surveillance. I last saw her a year ago. Mammogram on 12/27/18 showed no evidence of malignancy bilaterally. She presents to the clinic today for follow-up.  Her only complaint is discomfort along the surgical scar on the medial aspect of the breast.  The bra seems to rub on it and makes it uncomfortable.  REVIEW OF SYSTEMS:   Constitutional: Denies fevers, chills or abnormal weight loss Eyes: Denies blurriness of vision Ears, nose, mouth, throat, and face: Denies mucositis or sore throat Respiratory: Denies cough, dyspnea or wheezes Cardiovascular: Denies palpitation, chest discomfort Gastrointestinal: Denies nausea, heartburn or change in bowel habits Skin: Denies abnormal skin rashes Lymphatics: Denies new lymphadenopathy or easy bruising Neurological: Denies numbness, tingling or new weaknesses Behavioral/Psych: Mood is stable, no new changes  Extremities: No lower  extremity edema Breast: Discomfort around the bra line All other systems were reviewed with the patient and are negative.  I have reviewed the past medical history, past surgical history, social history and family history with the patient and they are unchanged from previous note.  ALLERGIES:  is allergic to codeine; epinephrine; and tenormin [atenolol].  MEDICATIONS:  Current Outpatient Medications  Medication Sig Dispense Refill  . acetaminophen (TYLENOL) 500 MG tablet Take 1,000 mg by mouth 3 (three) times daily as needed for moderate pain.     Marland Kitchen diclofenac sodium (VOLTAREN) 1 % GEL     . ELIQUIS 5 MG TABS tablet TAKE 1 TABLET BY MOUTH 2 TIMES A DAY 60 tablet 3  . fluconazole (DIFLUCAN) 100 MG tablet Take one tab po daily x 3 days and then take one tab po daily as needed. (Patient not taking: Reported on 03/27/2019) 10 tablet 0  . hydrALAZINE (APRESOLINE) 25 MG tablet Take 1 tablet (25 mg total) by mouth daily with breakfast. (Patient not taking: Reported on 03/27/2019) 60 tablet 0  . levothyroxine (SYNTHROID, LEVOTHROID) 112 MCG tablet Take 112 mcg by mouth daily before breakfast.    . lidocaine (LMX) 4 % cream Apply 1 application topically as needed.    . Misc Natural Products (GLUCOSAMINE CHOND MSM FORMULA PO) Take 2 tablets by mouth daily.    . nadolol (CORGARD) 20 MG tablet Take 1 tablet (20 mg total) by mouth daily. (Patient not taking: Reported on 03/27/2019) 90 tablet 1  . pantoprazole (PROTONIX) 40 MG tablet Take 40 mg by mouth daily as needed (indigestion).     Orlie Dakin Sodium (SENOKOT S PO) Take 1 tablet by mouth as  needed.    Marland Kitchen terconazole (TERAZOL 7) 0.4 % vaginal cream Place 1 applicator vaginally at bedtime. 45 g 0   No current facility-administered medications for this visit.     PHYSICAL EXAMINATION: ECOG PERFORMANCE STATUS: 1 - Symptomatic but completely ambulatory  Vitals:   06/07/19 0956  BP: (!) 192/87  Pulse: 93  Resp: 17  Temp: 98 F (36.7 C)   SpO2: 98%   Filed Weights   06/07/19 0956  Weight: 141 lb 1.6 oz (64 kg)    GENERAL: alert, no distress and comfortable SKIN: skin color, texture, turgor are normal, no rashes or significant lesions EYES: normal, Conjunctiva are pink and non-injected, sclera clear OROPHARYNX: no exudate, no erythema and lips, buccal mucosa, and tongue normal  NECK: supple, thyroid normal size, non-tender, without nodularity LYMPH: no palpable lymphadenopathy in the cervical, axillary or inguinal LUNGS: clear to auscultation and percussion with normal breathing effort HEART: regular rate & rhythm and no murmurs and no lower extremity edema ABDOMEN: abdomen soft, non-tender and normal bowel sounds MUSCULOSKELETAL: no cyanosis of digits and no clubbing  NEURO: alert & oriented x 3 with fluent speech, no focal motor/sensory deficits EXTREMITIES: No lower extremity edema  LABORATORY DATA:  I have reviewed the data as listed CMP Latest Ref Rng & Units 07/31/2018 07/30/2018 07/30/2018  Glucose 70 - 99 mg/dL 97 95 94  BUN 8 - 23 mg/dL 20 14 16   Creatinine 0.44 - 1.00 mg/dL 0.80 0.60 0.69  Sodium 135 - 145 mmol/L 140 138 140  Potassium 3.5 - 5.1 mmol/L 3.6 3.7 3.7  Chloride 98 - 111 mmol/L 108 107 108  CO2 22 - 32 mmol/L 22 - 24  Calcium 8.9 - 10.3 mg/dL 9.0 - 8.9  Total Protein 6.5 - 8.1 g/dL - - 6.3(L)  Total Bilirubin 0.3 - 1.2 mg/dL - - 0.7  Alkaline Phos 38 - 126 U/L - - 75  AST 15 - 41 U/L - - 12(L)  ALT 0 - 44 U/L - - 10    Lab Results  Component Value Date   WBC 9.1 07/31/2018   HGB 13.2 07/31/2018   HCT 42.8 07/31/2018   MCV 95.5 07/31/2018   PLT 271 07/31/2018   NEUTROABS 5.3 07/30/2018    ASSESSMENT & PLAN:  Malignant neoplasm of upper-inner quadrant of left breast in female, estrogen receptor positive ([facility]) 05/30/2017:Left lumpectomy: IDC grade 1, 0.8 cm, with low-grade DCIS, ER 100%, PR 50%, Ki-67 5%, HER-2 negative ratio 1.5, T1BN0 stage I a  Did not receive adj XRT Patient also  refused antiestrogen therapy  Current treatment: Surveillance 1.  Breast exams 06/07/2019: Benign 2. mammogram: 12/27/2018: No evidence of malignancy breast density category B  Patient tells me that her biggest issues are related to blood pressure and not feeling well.      Return to clinic in 1 year for follow-up with long-term survivorship.  No orders of the defined types were placed in this encounter.  The patient has a good understanding of the overall plan. she agrees with it. she will call with any problems that may develop before the next visit here.  Nicholas Lose, MD 06/07/2019  Julious Oka Dorshimer am acting as scribe for Dr. Nicholas Lose.  I have reviewed the above documentation for accuracy and completeness, and I agree with the above.

## 2019-06-07 ENCOUNTER — Other Ambulatory Visit: Payer: Self-pay

## 2019-06-07 ENCOUNTER — Inpatient Hospital Stay: Payer: PPO | Attending: Hematology and Oncology | Admitting: Hematology and Oncology

## 2019-06-07 DIAGNOSIS — Z7901 Long term (current) use of anticoagulants: Secondary | ICD-10-CM | POA: Insufficient documentation

## 2019-06-07 DIAGNOSIS — Z170 Estrogen receptor positive status [ER+]: Secondary | ICD-10-CM | POA: Insufficient documentation

## 2019-06-07 DIAGNOSIS — Z79899 Other long term (current) drug therapy: Secondary | ICD-10-CM | POA: Insufficient documentation

## 2019-06-07 DIAGNOSIS — C50212 Malignant neoplasm of upper-inner quadrant of left female breast: Secondary | ICD-10-CM

## 2019-06-07 NOTE — Assessment & Plan Note (Signed)
05/30/2017:Left lumpectomy: IDC grade 1, 0.8 cm, with low-grade DCIS, ER 100%, PR 50%, Ki-67 5%, HER-2 negative ratio 1.5, T1BN0 stage I a  Did not receive adj XRT Patient also refused antiestrogen therapy  Current treatment: Surveillance 1.  Breast exams 06/07/2019: Benign 2. mammogram: 12/27/2018: No evidence of malignancy breast density category B  Patient tells me that her biggest issues are related to blood pressure and not feeling well.      Return to clinic in 1 year for follow-up with long-term survivorship.

## 2019-06-10 ENCOUNTER — Telehealth: Payer: Self-pay | Admitting: Hematology and Oncology

## 2019-06-10 NOTE — Telephone Encounter (Signed)
I talk with patient regarding schedule  

## 2019-06-14 DIAGNOSIS — M25562 Pain in left knee: Secondary | ICD-10-CM | POA: Diagnosis not present

## 2019-06-14 DIAGNOSIS — Z96651 Presence of right artificial knee joint: Secondary | ICD-10-CM | POA: Diagnosis not present

## 2019-06-14 DIAGNOSIS — Z471 Aftercare following joint replacement surgery: Secondary | ICD-10-CM | POA: Diagnosis not present

## 2019-06-14 DIAGNOSIS — Z96642 Presence of left artificial hip joint: Secondary | ICD-10-CM | POA: Diagnosis not present

## 2019-06-14 DIAGNOSIS — M25552 Pain in left hip: Secondary | ICD-10-CM | POA: Diagnosis not present

## 2019-06-22 DIAGNOSIS — E039 Hypothyroidism, unspecified: Secondary | ICD-10-CM | POA: Diagnosis not present

## 2019-06-22 DIAGNOSIS — I10 Essential (primary) hypertension: Secondary | ICD-10-CM | POA: Diagnosis not present

## 2019-06-22 DIAGNOSIS — I482 Chronic atrial fibrillation, unspecified: Secondary | ICD-10-CM | POA: Diagnosis not present

## 2019-06-22 DIAGNOSIS — R2689 Other abnormalities of gait and mobility: Secondary | ICD-10-CM | POA: Diagnosis not present

## 2019-06-22 DIAGNOSIS — R2681 Unsteadiness on feet: Secondary | ICD-10-CM | POA: Diagnosis not present

## 2019-06-22 DIAGNOSIS — Z87891 Personal history of nicotine dependence: Secondary | ICD-10-CM | POA: Diagnosis not present

## 2019-06-22 DIAGNOSIS — Z7901 Long term (current) use of anticoagulants: Secondary | ICD-10-CM | POA: Diagnosis not present

## 2019-06-22 DIAGNOSIS — M2170 Unequal limb length (acquired), unspecified site: Secondary | ICD-10-CM | POA: Diagnosis not present

## 2019-06-22 DIAGNOSIS — Z79899 Other long term (current) drug therapy: Secondary | ICD-10-CM | POA: Diagnosis not present

## 2019-06-22 DIAGNOSIS — R42 Dizziness and giddiness: Secondary | ICD-10-CM | POA: Diagnosis not present

## 2019-06-22 DIAGNOSIS — R930 Abnormal findings on diagnostic imaging of skull and head, not elsewhere classified: Secondary | ICD-10-CM | POA: Diagnosis not present

## 2019-06-22 DIAGNOSIS — I480 Paroxysmal atrial fibrillation: Secondary | ICD-10-CM | POA: Diagnosis not present

## 2019-06-23 DIAGNOSIS — I10 Essential (primary) hypertension: Secondary | ICD-10-CM | POA: Diagnosis not present

## 2019-06-23 DIAGNOSIS — I480 Paroxysmal atrial fibrillation: Secondary | ICD-10-CM | POA: Diagnosis not present

## 2019-06-23 DIAGNOSIS — I517 Cardiomegaly: Secondary | ICD-10-CM | POA: Diagnosis not present

## 2019-06-23 DIAGNOSIS — Z7901 Long term (current) use of anticoagulants: Secondary | ICD-10-CM | POA: Diagnosis not present

## 2019-06-23 DIAGNOSIS — H81393 Other peripheral vertigo, bilateral: Secondary | ICD-10-CM | POA: Diagnosis not present

## 2019-06-23 DIAGNOSIS — E039 Hypothyroidism, unspecified: Secondary | ICD-10-CM | POA: Diagnosis not present

## 2019-06-23 DIAGNOSIS — R2689 Other abnormalities of gait and mobility: Secondary | ICD-10-CM | POA: Diagnosis not present

## 2019-06-23 DIAGNOSIS — R42 Dizziness and giddiness: Secondary | ICD-10-CM | POA: Diagnosis not present

## 2019-06-24 DIAGNOSIS — I480 Paroxysmal atrial fibrillation: Secondary | ICD-10-CM | POA: Diagnosis not present

## 2019-06-24 DIAGNOSIS — R42 Dizziness and giddiness: Secondary | ICD-10-CM | POA: Diagnosis not present

## 2019-06-24 DIAGNOSIS — H81391 Other peripheral vertigo, right ear: Secondary | ICD-10-CM | POA: Diagnosis not present

## 2019-06-24 DIAGNOSIS — R2689 Other abnormalities of gait and mobility: Secondary | ICD-10-CM | POA: Diagnosis not present

## 2019-06-25 ENCOUNTER — Telehealth: Payer: Self-pay

## 2019-06-25 NOTE — Telephone Encounter (Signed)
Pt called she was seen recently for dizziness and loosing balance from another doc; they told her to let us know and she wants to know does she need to be seen here too or wait until she is having issues again

## 2019-06-25 NOTE — Telephone Encounter (Signed)
We can wait and see.   Thanks

## 2019-06-26 NOTE — Telephone Encounter (Signed)
LVM with details.

## 2019-07-05 DIAGNOSIS — I951 Orthostatic hypotension: Secondary | ICD-10-CM | POA: Diagnosis not present

## 2019-07-05 DIAGNOSIS — I480 Paroxysmal atrial fibrillation: Secondary | ICD-10-CM | POA: Diagnosis not present

## 2019-07-05 DIAGNOSIS — C50919 Malignant neoplasm of unspecified site of unspecified female breast: Secondary | ICD-10-CM | POA: Diagnosis not present

## 2019-07-05 DIAGNOSIS — I10 Essential (primary) hypertension: Secondary | ICD-10-CM | POA: Diagnosis not present

## 2019-07-05 DIAGNOSIS — R42 Dizziness and giddiness: Secondary | ICD-10-CM | POA: Diagnosis not present

## 2019-07-05 DIAGNOSIS — M1990 Unspecified osteoarthritis, unspecified site: Secondary | ICD-10-CM | POA: Diagnosis not present

## 2019-07-08 ENCOUNTER — Telehealth: Payer: Self-pay

## 2019-07-08 DIAGNOSIS — R262 Difficulty in walking, not elsewhere classified: Secondary | ICD-10-CM | POA: Diagnosis not present

## 2019-07-08 DIAGNOSIS — R42 Dizziness and giddiness: Secondary | ICD-10-CM | POA: Diagnosis not present

## 2019-07-08 DIAGNOSIS — R2681 Unsteadiness on feet: Secondary | ICD-10-CM | POA: Diagnosis not present

## 2019-07-08 NOTE — Telephone Encounter (Signed)
Pt called to let us know her bp was high today (she cant remember the number) in therapy and they didn't do it; Per pt she states both jg and Virgina Jock are aware that this happens when she comes to the doctor and after 30 mins or so the bp will go down;

## 2019-07-17 DIAGNOSIS — R198 Other specified symptoms and signs involving the digestive system and abdomen: Secondary | ICD-10-CM | POA: Diagnosis not present

## 2019-07-17 DIAGNOSIS — M7062 Trochanteric bursitis, left hip: Secondary | ICD-10-CM | POA: Diagnosis not present

## 2019-07-17 DIAGNOSIS — Z96643 Presence of artificial hip joint, bilateral: Secondary | ICD-10-CM | POA: Diagnosis not present

## 2019-08-03 DIAGNOSIS — J019 Acute sinusitis, unspecified: Secondary | ICD-10-CM | POA: Diagnosis not present

## 2019-08-03 DIAGNOSIS — Z20828 Contact with and (suspected) exposure to other viral communicable diseases: Secondary | ICD-10-CM | POA: Diagnosis not present

## 2019-08-05 ENCOUNTER — Ambulatory Visit: Payer: PPO | Admitting: Podiatry

## 2019-08-05 ENCOUNTER — Other Ambulatory Visit: Payer: Self-pay

## 2019-08-05 DIAGNOSIS — L600 Ingrowing nail: Secondary | ICD-10-CM | POA: Diagnosis not present

## 2019-08-05 DIAGNOSIS — M79676 Pain in unspecified toe(s): Secondary | ICD-10-CM

## 2019-08-05 NOTE — Progress Notes (Signed)
  Subjective:  Patient ID: Heidi Adams, female    DOB: Jun 30, 1936,  MRN: [id number]  Chief Complaint  Patient presents with  . Nail Problem    Pt stats Lt hallux medial border is still ingrown and bothering her; 4-5/10 shapr pains -w/ slight redness -pt denies swellgin and draiange Tx; none     84 y.o. female presents with the above complaint. History confirmed with patient.   Objective:  Physical Exam: warm, good capillary refill, nail exam ingrown nail at both borders left hallux, no trophic changes or ulcerative lesions,     Assessment:   1. Ingrown nail   2. Pain around toenail      Plan:  Patient was evaluated and treated and all questions answered.  Ingrown Nail -Nail debrided in slant back fashion both borders -No need for full avulsion of the nail plate. -Soak tonight in water and epsom salt. -F/u PRN   No follow-ups on file.   MDM Number of Diagnoses or Management Options Ingrown nail: established, worsening Risk of Complications, Morbidity, and/or Mortality Presenting problems: minimal Management options: minimal

## 2019-08-07 ENCOUNTER — Other Ambulatory Visit: Payer: Self-pay

## 2019-08-07 DIAGNOSIS — I480 Paroxysmal atrial fibrillation: Secondary | ICD-10-CM

## 2019-08-07 MED ORDER — APIXABAN 5 MG PO TABS: 5.0000 mg | ORAL_TABLET | Freq: Two times a day (BID) | ORAL | 3 refills | Status: DC

## 2019-08-26 ENCOUNTER — Other Ambulatory Visit: Payer: Self-pay | Admitting: Podiatry

## 2019-08-26 ENCOUNTER — Ambulatory Visit: Payer: PPO | Admitting: Podiatry

## 2019-08-26 ENCOUNTER — Ambulatory Visit (INDEPENDENT_AMBULATORY_CARE_PROVIDER_SITE_OTHER): Payer: PPO

## 2019-08-26 ENCOUNTER — Other Ambulatory Visit: Payer: Self-pay

## 2019-08-26 DIAGNOSIS — L600 Ingrowing nail: Secondary | ICD-10-CM | POA: Diagnosis not present

## 2019-08-26 DIAGNOSIS — M19071 Primary osteoarthritis, right ankle and foot: Secondary | ICD-10-CM | POA: Diagnosis not present

## 2019-08-26 DIAGNOSIS — M19079 Primary osteoarthritis, unspecified ankle and foot: Secondary | ICD-10-CM | POA: Diagnosis not present

## 2019-08-26 DIAGNOSIS — M25571 Pain in right ankle and joints of right foot: Secondary | ICD-10-CM

## 2019-08-26 DIAGNOSIS — M79676 Pain in unspecified toe(s): Secondary | ICD-10-CM | POA: Diagnosis not present

## 2019-08-26 DIAGNOSIS — I872 Venous insufficiency (chronic) (peripheral): Secondary | ICD-10-CM

## 2019-08-26 MED ORDER — TRIAMCINOLONE ACETONIDE 0.1 % EX CREA: 1.0000 "application " | TOPICAL_CREAM | Freq: Two times a day (BID) | CUTANEOUS | 0 refills | Status: DC

## 2019-08-26 NOTE — Progress Notes (Signed)
  Subjective:  Patient ID: Heidi Adams, female    DOB: 08/25/1935,  MRN: [id number]  Chief Complaint  Patient presents with  . Nail Problem    Pt c/o Lt hallux medial border pian -pt stats nail is growing back into skin and making it sore Tx: none -pt denies redness/swellgin/driange   . Foot Pain    Rt dorsal and plantar foot pain and BL side of rt ankle x Thurs -w/ swellgin -getting owree -got better without compression sock     84 y.o. female presents with the above complaint. History confirmed with patient.  Objective:  Physical Exam: warm, good capillary refill, no trophic changes or ulcerative lesions, normal DP and PT pulses and normal sensory exam. Left Foot: ingrown nail left hallux  Right Foot: right leg pitting edema with dermatitis   Radiographs: X-ray of the right foot: no fracture, dislocation, swelling; degenerative changes noted   Assessment:   1. Ingrown nail   2. Pain around toenail   3. Venous stasis dermatitis of right lower extremity   4. Arthritis of midfoot    Plan:  Patient was evaluated and treated and all questions answered.  Ingrown Nail -Nail gently debrided  Venous Stasis Dermatitis -Rx Triamcinolone -Discussed importance of compression.  Midfoot Arthritis -XR reviewed -Defer injection  Return if symptoms worsen or fail to improve.

## 2019-08-28 ENCOUNTER — Other Ambulatory Visit: Payer: Self-pay | Admitting: Podiatry

## 2019-08-28 DIAGNOSIS — M19079 Primary osteoarthritis, unspecified ankle and foot: Secondary | ICD-10-CM

## 2019-09-06 DIAGNOSIS — M19071 Primary osteoarthritis, right ankle and foot: Secondary | ICD-10-CM | POA: Diagnosis not present

## 2019-09-06 DIAGNOSIS — M79671 Pain in right foot: Secondary | ICD-10-CM | POA: Diagnosis not present

## 2019-09-20 DIAGNOSIS — M79671 Pain in right foot: Secondary | ICD-10-CM | POA: Diagnosis not present

## 2019-09-24 ENCOUNTER — Ambulatory Visit: Payer: PPO | Admitting: Cardiology

## 2019-09-24 ENCOUNTER — Other Ambulatory Visit: Payer: Self-pay

## 2019-09-24 ENCOUNTER — Encounter: Payer: Self-pay | Admitting: Cardiology

## 2019-09-24 VITALS — Temp 97.7°F | Ht 64.0 in | Wt 143.7 lb

## 2019-09-24 DIAGNOSIS — I5032 Chronic diastolic (congestive) heart failure: Secondary | ICD-10-CM | POA: Diagnosis not present

## 2019-09-24 DIAGNOSIS — I951 Orthostatic hypotension: Secondary | ICD-10-CM | POA: Diagnosis not present

## 2019-09-24 DIAGNOSIS — I10 Essential (primary) hypertension: Secondary | ICD-10-CM | POA: Diagnosis not present

## 2019-09-24 DIAGNOSIS — I480 Paroxysmal atrial fibrillation: Secondary | ICD-10-CM

## 2019-09-24 NOTE — Progress Notes (Addendum)
Primary Physician:  [name], MD   Patient ID: Heidi Adams, female    DOB: 17-Jun-1936, 84 y.o.   MRN: [id number]  Subjective:    Chief Complaint  Patient presents with  . Hypertension  . Atrial Fibrillation  . Follow-up    6 month    HPI: Heidi Adams  is a 84 y.o. female  with no significant prior cardiac history except for bilateral hip replacement and knee replacement and left breast lumpectomy in 2018 without chemotherapy or radiation therapy for breast cancer and also remote hysterectomy due to uterine cancer in 1991, supine hypertension and orthostatic hypotension. She has chronic dizziness on standing. She has had one episode of atrial fibrillation when she presented with marked dizziness to [facility] in 2017 and converted to sinus rhythm on Amiodarone. Patient had ?TIA, on 07/30/2018, she did not have any neurologic deficits, CT scan and MRI did not reveal any acute infarct. Carotid artery duplex did not reveal any significant stenosis. She was found to have severely elevated blood pressure but she was also orthostatic. She is here for 6 month follow up.  Overall feels that she is doing well. No complaints. She is on prednisone for foot injury. She is intolerant to multiple medications and has stopped all meds as this causes significant drops in her BP. She monitors her BP at home generally 123456 systolic standing first thing in the morning and then she takes it a few hours later and has dropped to [id number] systolic standing.  She again mentions that she does have chronic joint pain and is wanting to stop Eliquis so that she can take Ibuprofen daily. She states that she has not had any atrial fibrillation that is clearly documented.   Past Medical History:  Diagnosis Date  . Allergy   . Anemia    hx of anemia  . Arthritis   . Breast cancer ([place])    Left breast  . Cancer ([place])    skin ca nose  . Chronic combined systolic and diastolic CHF  (congestive heart failure) ([place]) 09/22/2018  . Chronic combined systolic and diastolic CHF (congestive heart failure) ([place]) 09/22/2018  . Chronic combined systolic and diastolic CHF (congestive heart failure) ([place]) 09/22/2018  . Dysrhythmia    went into Atrial Fibrillation after last surgery, HAS PALPITATIONS  . History of skin cancer   . Hypertension   . Hypothyroidism   . PVC (premature ventricular contraction)   . Thyroiditis 1973    Past Surgical History:  Procedure Laterality Date  . ABDOMINAL HYSTERECTOMY  90   TAH BSO  . APPENDECTOMY    . BREAST LUMPECTOMY Left 05/30/2017  . BREAST LUMPECTOMY WITH RADIOACTIVE SEED LOCALIZATION Left 05/30/2017   Procedure: LEFT BREAST LUMPECTOMY WITH RADIOACTIVE SEED LOCALIZATION;  Surgeon: Excell Seltzer, MD;  Location: [facility];  Service: General;  Laterality: Left;  . CATARACT EXTRACTION  09/10/2018   Right eye  . CHOLECYSTECTOMY     dr Georgia Lopes  . ELBOW SURGERY    . EYE SURGERY     "on my eyelids" years ago  . KNEE ARTHROSCOPY     left  . THYROIDECTOMY  1973  . TONSILLECTOMY    . TOTAL HIP ARTHROPLASTY Right 05/10/2016   Procedure: TOTAL HIP ARTHROPLASTY ANTERIOR APPROACH;  Surgeon: Paralee Cancel, MD;  Location: [facility];  Service: Orthopedics;  Laterality: Right;  . TOTAL HIP ARTHROPLASTY Left 01/10/2017   Procedure: LEFT TOTAL HIP ARTHROPLASTY ANTERIOR APPROACH;  Surgeon: Paralee Cancel, MD;  Location: [facility];  Service: Orthopedics;  Laterality: Left;  . TOTAL KNEE ARTHROPLASTY Left 09/28/2015   Procedure: LEFT TOTAL KNEE ARTHROPLASTY;  Surgeon: Paralee Cancel, MD;  Location: [facility];  Service: Orthopedics;  Laterality: Left;  . TOTAL KNEE ARTHROPLASTY Right 08/09/2016   Procedure: RIGHT TOTAL KNEE ARTHROPLASTY;  Surgeon: Paralee Cancel, MD;  Location: [facility];  Service: Orthopedics;  Laterality: Right;  Adductor Block  . Total Left hip arthroplasty     01/10/17 Dr. Alvan Dame    Social History   Socioeconomic History  . Marital  status: Married    Spouse name: Not on file  . Number of children: 2  . Years of education: Not on file  . Highest education level: Not on file  Occupational History  . Not on file  Tobacco Use  . Smoking status: Former Smoker    Packs/day: 1.00    Years: 20.00    Pack years: 20.00    Types: Cigarettes    Quit date: 07/26/1987    Years since quitting: 32.1  . Smokeless tobacco: Never Used  Substance and Sexual Activity  . Alcohol use: No    Alcohol/week: 0.0 standard drinks  . Drug use: No  . Sexual activity: Not Currently    Comment: 1st intercourse 68 yo-1 partner  Other Topics Concern  . Not on file  Social History Narrative  . Not on file   Social Determinants of Health   Financial Resource Strain:   . Difficulty of Paying Living Expenses: Not on file  Food Insecurity:   . Worried About Charity fundraiser in the Last Year: Not on file  . Ran Out of Food in the Last Year: Not on file  Transportation Needs:   . Lack of Transportation (Medical): Not on file  . Lack of Transportation (Non-Medical): Not on file  Physical Activity:   . Days of Exercise per Week: Not on file  . Minutes of Exercise per Session: Not on file  Stress:   . Feeling of Stress : Not on file  Social Connections:   . Frequency of Communication with Friends and Family: Not on file  . Frequency of Social Gatherings with Friends and Family: Not on file  . Attends Religious Services: Not on file  . Active Member of Clubs or Organizations: Not on file  . Attends Archivist Meetings: Not on file  . Marital Status: Not on file  Intimate Partner Violence:   . Fear of Current or Ex-Partner: Not on file  . Emotionally Abused: Not on file  . Physically Abused: Not on file  . Sexually Abused: Not on file    Review of Systems  Constitution: Positive for malaise/fatigue. Negative for decreased appetite, weight gain and weight loss.  Eyes: Negative for visual disturbance.  Cardiovascular:  Positive for dyspnea on exertion (mild) and leg swelling (intermittent, improved with support stockings). Negative for chest pain, claudication, orthopnea, palpitations and syncope.  Respiratory: Negative for hemoptysis and wheezing.   Endocrine: Negative for cold intolerance and heat intolerance.  Hematologic/Lymphatic: Does not bruise/bleed easily.  Skin: Negative for nail changes.  Musculoskeletal: Positive for joint pain. Negative for muscle weakness and myalgias.  Gastrointestinal: Negative for abdominal pain, change in bowel habit, nausea and vomiting.  Neurological: Negative for difficulty with concentration, dizziness, focal weakness and headaches.  Psychiatric/Behavioral: Negative for altered mental status and suicidal ideas.  All other systems reviewed and are negative.     Objective:  Temperature 97.7 F (36.5 C), height 5\' 4"  (1.626 m), weight 143 lb 11.2 oz (65.2 kg), last menstrual period 07/25/1988, SpO2 97 %. Body mass index is 24.67 kg/m.    Orthostatic VS for the past 72 hrs (Last 3 readings):  Orthostatic BP Patient Position BP Location Cuff Size Orthostatic Pulse  09/24/19 1101 (!) 190/97 Standing Right Arm Normal 93  09/24/19 1100 (!) 202/98 Sitting Right Arm Normal 90  09/24/19 1050 (!) 207/109 Supine Right Arm Normal 92   Home blood pressure log shows systolic readings in the 99991111 to 123456 systolic  Physical Exam  Constitutional: She is oriented to person, place, and time. Vital signs are normal. She appears well-developed and well-nourished.  HENT:  Head: Normocephalic and atraumatic.  Cardiovascular: Normal rate, regular rhythm, normal heart sounds and intact distal pulses.  Pulmonary/Chest: Effort normal and breath sounds normal. No accessory muscle usage. No respiratory distress.  Abdominal: Soft. Bowel sounds are normal.  Musculoskeletal:        General: Normal range of motion.     Cervical back: Normal range of motion.  Neurological: She is alert and  oriented to person, place, and time.  Skin: Skin is warm and dry.  Vitals reviewed.  Radiology: No results found.  Laboratory examination:    CMP Latest Ref Rng & Units 07/31/2018 07/30/2018 07/30/2018  Glucose 70 - 99 mg/dL 97 95 94  BUN 8 - 23 mg/dL 20 14 16   Creatinine 0.44 - 1.00 mg/dL 0.80 0.60 0.69  Sodium 135 - 145 mmol/L 140 138 140  Potassium 3.5 - 5.1 mmol/L 3.6 3.7 3.7  Chloride 98 - 111 mmol/L 108 107 108  CO2 22 - 32 mmol/L 22 - 24  Calcium 8.9 - 10.3 mg/dL 9.0 - 8.9  Total Protein 6.5 - 8.1 g/dL - - 6.3(L)  Total Bilirubin 0.3 - 1.2 mg/dL - - 0.7  Alkaline Phos 38 - 126 U/L - - 75  AST 15 - 41 U/L - - 12(L)  ALT 0 - 44 U/L - - 10   CBC Latest Ref Rng & Units 07/31/2018 07/30/2018 07/30/2018  WBC 4.0 - 10.5 K/uL 9.1 - 7.1  Hemoglobin 12.0 - 15.0 g/dL 13.2 13.3 12.7  Hematocrit 36.0 - 46.0 % 42.8 39.0 40.6  Platelets 150 - 400 K/uL 271 - 254   Lipid Panel     Component Value Date/Time   CHOL 142 07/31/2018 0655   TRIG 72 07/31/2018 0655   HDL 45 07/31/2018 0655   CHOLHDL 3.2 07/31/2018 0655   VLDL 14 07/31/2018 0655   LDLCALC 83 07/31/2018 0655   HEMOGLOBIN A1C Lab Results  Component Value Date   HGBA1C 5.0 07/31/2018   MPG 96.8 07/31/2018   TSH No results for input(s): TSH in the last 8760 hours.  PRN Meds:. Medications Discontinued During This Encounter  Medication Reason  . amoxicillin-clavulanate (AUGMENTIN) 875-125 MG tablet Error  . fluconazole (DIFLUCAN) 100 MG tablet Error  . hydrALAZINE (APRESOLINE) 25 MG tablet Error  . nadolol (CORGARD) 20 MG tablet Error  . Misc Natural Products (GLUCOSAMINE CHOND MSM FORMULA PO) Error  . terconazole (TERAZOL 7) 0.4 % vaginal cream Error  . triamcinolone cream (KENALOG) 0.1 % Error   Current Meds  Medication Sig  . acetaminophen (TYLENOL) 500 MG tablet Take 1,000 mg by mouth 3 (three) times daily as needed for moderate pain.   Marland Kitchen apixaban (ELIQUIS) 5 MG TABS tablet Take 1 tablet (5 mg total) by mouth 2 (two)  times daily.  Marland Kitchen  diclofenac sodium (VOLTAREN) 1 % GEL   . levothyroxine (SYNTHROID, LEVOTHROID) 112 MCG tablet Take 112 mcg by mouth daily before breakfast.  . lidocaine (LMX) 4 % cream Apply 1 application topically as needed.  . meclizine (ANTIVERT) 25 MG tablet meclizine 25 mg tablet  . pantoprazole (PROTONIX) 40 MG tablet Take 40 mg by mouth daily as needed (indigestion).   . predniSONE (DELTASONE) 5 MG tablet prednisone 5 mg tablet    Cardiac Studies:   Echocardiogram  07/30/2018 - Left ventricle: The cavity size was normal. There was moderate concentric hypertrophy. Systolic function assessment limited due to frequent PAC&'s. LVEF probably around 50%. Wall motion was normal; there were no regional wall motion abnormalities. Doppler parameters are consistent with abnormal left ventricular relaxation (grade 1 diastolic dysfunction). - Aortic valve: Mildly calcified annulus. Trileaflet valve. There was moderate regurgitation.  Echocardiogram 03/21/2018: Left ventricle cavity is normal in size. Moderate concentric hypertrophy of the left ventricle. Moderate decrease in global wall motion. Doppler evidence of grade II (pseudonormal) diastolic dysfunction, elevated LAP. Calculated EF 30%. Left atrial cavity is normal in size. Aneurysmal interatrial septum without PFO. Moderate (Grade II) aortic regurgitation. Mild mitral valve leaflet calcification. Study is technically inadequate to assess for mitral stenosis.  Moderate (Grade II) mitral regurgitation. Mild tricuspid regurgitation. Estimated pulmonary artery systolic pressure 24  mmHg. Mild pulmonic regurgitation. Insignificant pericardial effusion. IVC is dilated with respiratory variation. Estimated RA pressure 8 mmHg.  Lexiscan Myoview stress test  04/23/2018: 1. The resting electrocardiogram demonstrated normal sinus rhythm, normal resting conduction and no resting arrhythmias.  Stress EKG is non-diagnostic for ischemia as it  a pharmacologic stress using Lexiscan. Occasional PAC and PVC. The patient developed significant symptoms which included Chest Pain, Stomach pain, Headache.  2. The overall quality of the study is good. There is no evidence of abnormal lung activity. Stress and rest SPECT images demonstrate homogeneous tracer distribution throughout the myocardium. Gated SPECT imaging reveals diffuse global hypokinesis. The left ventricular ejection fraction was markedly depressed at (26%).   3. This is a high risk study due to severe LV systolic dysfunction. Findings may represent non ischemic cardiomyopathy.  Assessment:   Paroxysmal atrial fibrillation (HCC) CHA2DS2-VASc Score is 6 - Plan: EKG [id number]  Chronic diastolic CHF (congestive heart failure) (HCC) - Plan: PCV ECHOCARDIOGRAM COMPLETE  Essential hypertension  Orthostatic hypotension   EKG 09/24/2019: Normal sinus rhythm at 93 bpm with occasional PAC, left axis deviation, PRWP cannot exclude anterior infarct old. ST abnormality in lateral leads, cannot exclude ischemia Compared to EKG 03/27/2019: Normal sinus rhythm at 87 bpm, 1 PAC, left axis deviation, PRWP cannot exclude anteroseptal infarct old. No evidence of ischemia.   CHA2DS2-VASc Score is 6.  -(CHF; HTN; vasc disease DM,  Female = 1; Age <65 =0; 65-74 = 1,  >75 =2; stroke = 2).  -(Yearly risk of stroke: Score of 1=1.3; 2=2.2; 3=3.2; 4=4; 5=6.7; 6=9.8; 7=>9.8)  Recommendations:   Heidi Adams  is a 84 y.o. female  with no significant prior cardiac history except for bilateral hip replacement and knee replacement and left breast lumpectomy in 2018 without chemotherapy or radiation therapy for breast cancer and also remote hysterectomy due to uterine cancer in 1991, supine hypertension and orthostatic hypotension. She has chronic dizziness on standing. She has had one episode of atrial fibrillation when she presented with marked dizziness to [facility] in 2017 and  converted to sinus rhythm on Amiodarone. Patient had ?TIA, on 07/30/2018, she did  not have any neurologic deficits, CT scan and MRI did not reveal any acute infarct. Carotid artery duplex did not reveal any significant stenosis. She was found to have severely elevated blood pressure but she was also orthostatic. She is here for 6 month follow up.  She is presently doing well without any significant complaints. She does have orthostatic hypotension with supine hypertension. She has been intolerant to several medications in the past, in which cause significant worsening of orthostasis. Although her blood pressure is elevated here, blood pressure is overall stable in the 140's (standing) at home. I have discussed retrying either small dose beta blocker or hydralazine; however, she does not want to start medication to help with her blood pressure at this time and feels that her blood pressure is fairly stable and that it is only elevated in our office. Will continue to monitor and she will notify me if this worsens.  Previously in 2019, LVEF was depressed at 30%, but had improved to 50% by echo in Jan 2020. No clinical evidence of decompensated heart failure. Denies any dyspnea. Leg edema is stable with use of support stockings.  There is documented A. fib on ER visit in 2017 at [facility], has not had known recurrence over the last 4 years.  She is high risk for cardioembolic event and continue to feel that she should be on anticoagulation.  I discussed risk and benefits of anticoagulation.  She continues to have activity limiting joint pain and is wanting to be on daily advil or other anti-inflammatory. She is aware of her CVA risk; therefore, does not want to be stop anticoagulation. She will continue with diclofenac gel and try only using Ibuprofen on a as needed basis, no more than twice a week. She would also like to further discuss with Dr. [name]. Will also recommend that she discuss other pain management  options with her PCP. She is aware of bleeding risk with ibuprofen and anticoagulation, and aware to watch for any signs of major bleeding. I will arrange for follow up in 4 weeks with Dr. [name] to further discuss her anticoagulation options and her anti-inflammatory use. Will repeat EKG due to slight EKG changes compared to previous EKG likely related to hypertension and orthostatics at that office visit.   Miquel Dunn, MSN, APRN, FNP-C [facility]. [place] Office: [phone] Fax: [phone]

## 2019-10-10 DIAGNOSIS — M79671 Pain in right foot: Secondary | ICD-10-CM | POA: Diagnosis not present

## 2019-10-10 DIAGNOSIS — M25571 Pain in right ankle and joints of right foot: Secondary | ICD-10-CM | POA: Diagnosis not present

## 2019-10-14 DIAGNOSIS — I480 Paroxysmal atrial fibrillation: Secondary | ICD-10-CM | POA: Diagnosis not present

## 2019-10-14 DIAGNOSIS — L509 Urticaria, unspecified: Secondary | ICD-10-CM | POA: Diagnosis not present

## 2019-10-14 DIAGNOSIS — I951 Orthostatic hypotension: Secondary | ICD-10-CM | POA: Diagnosis not present

## 2019-10-14 DIAGNOSIS — R42 Dizziness and giddiness: Secondary | ICD-10-CM | POA: Diagnosis not present

## 2019-10-14 DIAGNOSIS — I7090 Unspecified atherosclerosis: Secondary | ICD-10-CM | POA: Diagnosis not present

## 2019-10-14 DIAGNOSIS — E039 Hypothyroidism, unspecified: Secondary | ICD-10-CM | POA: Diagnosis not present

## 2019-10-14 DIAGNOSIS — K911 Postgastric surgery syndromes: Secondary | ICD-10-CM | POA: Diagnosis not present

## 2019-10-14 DIAGNOSIS — K219 Gastro-esophageal reflux disease without esophagitis: Secondary | ICD-10-CM | POA: Diagnosis not present

## 2019-10-14 DIAGNOSIS — M1990 Unspecified osteoarthritis, unspecified site: Secondary | ICD-10-CM | POA: Diagnosis not present

## 2019-10-14 DIAGNOSIS — I420 Dilated cardiomyopathy: Secondary | ICD-10-CM | POA: Diagnosis not present

## 2019-10-14 DIAGNOSIS — I10 Essential (primary) hypertension: Secondary | ICD-10-CM | POA: Diagnosis not present

## 2019-10-21 DIAGNOSIS — M25571 Pain in right ankle and joints of right foot: Secondary | ICD-10-CM | POA: Diagnosis not present

## 2019-10-25 DIAGNOSIS — M25571 Pain in right ankle and joints of right foot: Secondary | ICD-10-CM | POA: Diagnosis not present

## 2019-10-25 DIAGNOSIS — M7989 Other specified soft tissue disorders: Secondary | ICD-10-CM | POA: Insufficient documentation

## 2019-10-30 ENCOUNTER — Ambulatory Visit: Payer: PPO | Admitting: Cardiology

## 2019-11-04 DIAGNOSIS — E038 Other specified hypothyroidism: Secondary | ICD-10-CM | POA: Diagnosis not present

## 2019-11-07 DIAGNOSIS — Z85828 Personal history of other malignant neoplasm of skin: Secondary | ICD-10-CM | POA: Diagnosis not present

## 2019-11-07 DIAGNOSIS — L570 Actinic keratosis: Secondary | ICD-10-CM | POA: Diagnosis not present

## 2019-11-07 DIAGNOSIS — L821 Other seborrheic keratosis: Secondary | ICD-10-CM | POA: Diagnosis not present

## 2019-11-07 DIAGNOSIS — D485 Neoplasm of uncertain behavior of skin: Secondary | ICD-10-CM | POA: Diagnosis not present

## 2019-11-07 DIAGNOSIS — L850 Acquired ichthyosis: Secondary | ICD-10-CM | POA: Diagnosis not present

## 2019-11-07 DIAGNOSIS — L820 Inflamed seborrheic keratosis: Secondary | ICD-10-CM | POA: Diagnosis not present

## 2019-11-11 DIAGNOSIS — H04123 Dry eye syndrome of bilateral lacrimal glands: Secondary | ICD-10-CM | POA: Diagnosis not present

## 2019-11-19 ENCOUNTER — Other Ambulatory Visit: Payer: Self-pay | Admitting: Hematology and Oncology

## 2019-11-19 DIAGNOSIS — M25431 Effusion, right wrist: Secondary | ICD-10-CM | POA: Diagnosis not present

## 2019-11-19 DIAGNOSIS — Z9889 Other specified postprocedural states: Secondary | ICD-10-CM

## 2019-11-19 DIAGNOSIS — M2550 Pain in unspecified joint: Secondary | ICD-10-CM | POA: Diagnosis not present

## 2019-11-19 DIAGNOSIS — Z6824 Body mass index (BMI) 24.0-24.9, adult: Secondary | ICD-10-CM | POA: Diagnosis not present

## 2019-11-19 DIAGNOSIS — Z853 Personal history of malignant neoplasm of breast: Secondary | ICD-10-CM

## 2019-11-19 DIAGNOSIS — M85841 Other specified disorders of bone density and structure, right hand: Secondary | ICD-10-CM | POA: Diagnosis not present

## 2019-11-19 DIAGNOSIS — M7989 Other specified soft tissue disorders: Secondary | ICD-10-CM | POA: Diagnosis not present

## 2019-11-19 DIAGNOSIS — M85842 Other specified disorders of bone density and structure, left hand: Secondary | ICD-10-CM | POA: Diagnosis not present

## 2019-11-19 DIAGNOSIS — M150 Primary generalized (osteo)arthritis: Secondary | ICD-10-CM | POA: Diagnosis not present

## 2019-11-28 NOTE — Progress Notes (Signed)
Primary Physician/Referring:  [name], MD  Patient ID: Heidi Adams, female    DOB: Jan 17, 1936, 84 y.o.   MRN: [id number]  Chief Complaint  Patient presents with  . Anticoagulation  . Paroxysmal atrial fibrillation  . Follow-up    4 wk  . Atrial Fibrillation  . Hypertension   HPI:    Heidi Adams  is a 84 y.o. female  with bilateral hip replacement and knee replacement and left breast lumpectomy in 2018 without chemotherapy or radiation therapy for breast cancer and also remote hysterectomy due to uterine cancer in 1991, supine hypertension and orthostatic hypotension. She has chronic dizziness on standing. Has not been able to tolerate any BP medications due to severe dizzy spells.  There is documented A. fib on ER visit in 2017 at [facility], has not had known recurrence over the last 4 years.  This is a 68-month office visit, states that she is doing well and except for arthritis no specific complaints.  She is still having occasional episodes of dizziness when she suddenly stands up.  Past Medical History:  Diagnosis Date  . Allergy   . Anemia    hx of anemia  . Arthritis   . Breast cancer ([place])    Left breast  . Cancer ([place])    skin ca nose  . Chronic combined systolic and diastolic CHF (congestive heart failure) ([place]) 09/22/2018  . Chronic combined systolic and diastolic CHF (congestive heart failure) ([place]) 09/22/2018  . Chronic combined systolic and diastolic CHF (congestive heart failure) ([place]) 09/22/2018  . Dysrhythmia    went into Atrial Fibrillation after last surgery, HAS PALPITATIONS  . History of skin cancer   . Hypertension   . Hypothyroidism   . PVC (premature ventricular contraction)   . Thyroiditis 1973   Past Surgical History:  Procedure Laterality Date  . ABDOMINAL HYSTERECTOMY  90   TAH BSO  . APPENDECTOMY    . BREAST LUMPECTOMY Left 05/30/2017  . BREAST LUMPECTOMY WITH RADIOACTIVE SEED LOCALIZATION Left 05/30/2017   Procedure: LEFT BREAST  LUMPECTOMY WITH RADIOACTIVE SEED LOCALIZATION;  Surgeon: Excell Seltzer, MD;  Location: [facility];  Service: General;  Laterality: Left;  . CATARACT EXTRACTION  09/10/2018   Right eye  . CHOLECYSTECTOMY     dr Georgia Lopes  . ELBOW SURGERY    . EYE SURGERY     "on my eyelids" years ago  . KNEE ARTHROSCOPY     left  . THYROIDECTOMY  1973  . TONSILLECTOMY    . TOTAL HIP ARTHROPLASTY Right 05/10/2016   Procedure: TOTAL HIP ARTHROPLASTY ANTERIOR APPROACH;  Surgeon: Paralee Cancel, MD;  Location: [facility];  Service: Orthopedics;  Laterality: Right;  . TOTAL HIP ARTHROPLASTY Left 01/10/2017   Procedure: LEFT TOTAL HIP ARTHROPLASTY ANTERIOR APPROACH;  Surgeon: Paralee Cancel, MD;  Location: [facility];  Service: Orthopedics;  Laterality: Left;  . TOTAL KNEE ARTHROPLASTY Left 09/28/2015   Procedure: LEFT TOTAL KNEE ARTHROPLASTY;  Surgeon: Paralee Cancel, MD;  Location: [facility];  Service: Orthopedics;  Laterality: Left;  . TOTAL KNEE ARTHROPLASTY Right 08/09/2016   Procedure: RIGHT TOTAL KNEE ARTHROPLASTY;  Surgeon: Paralee Cancel, MD;  Location: [facility];  Service: Orthopedics;  Laterality: Right;  Adductor Block  . Total Left hip arthroplasty     01/10/17 Dr. Alvan Dame   Family History  Problem Relation Age of Onset  . Diabetes Father   . Hypertension Father   . Stroke Father   . Breast cancer Cousin   .  Pneumonia Sister     Social History   Tobacco Use  . Smoking status: Former Smoker    Packs/day: 1.00    Years: 20.00    Pack years: 20.00    Types: Cigarettes    Quit date: 07/26/1987    Years since quitting: 32.3  . Smokeless tobacco: Never Used  Substance Use Topics  . Alcohol use: No    Alcohol/week: 0.0 standard drinks   Marital Status: Married  ROS  Review of Systems  Constitution: Positive for malaise/fatigue. Negative for weight gain.  Cardiovascular: Positive for dyspnea on exertion (mild) and leg swelling (intermittent, improved with support stockings). Negative for syncope.    Respiratory: Negative for shortness of breath.   Musculoskeletal: Positive for arthritis and joint pain. Negative for joint swelling.   Objective  Blood pressure (!) 160/90, pulse 86, temperature 97.6 F (36.4 C), temperature source Temporal, resp. rate 16, height 5\' 4"  (1.626 m), weight 142 lb (64.4 kg), last menstrual period 07/25/1988, SpO2 99 %.  Vitals with BMI 11/29/2019 11/29/2019 09/24/2019  Height - 5\' 4"  5\' 4"   Weight - 142 lbs 143 lbs 11 oz  BMI - 123XX123 123XX123  Systolic 0000000 [id number] -  Diastolic 90 94 -  Pulse - 86 -     Physical Exam  Constitutional: No distress.  Moderately build, frail  Cardiovascular: Normal rate, regular rhythm and intact distal pulses. Exam reveals no gallop.  No murmur heard. No leg edema. No JVD.    Pulmonary/Chest: Effort normal and breath sounds normal. No accessory muscle usage. No respiratory distress.  Abdominal: Soft. Bowel sounds are normal.   Laboratory examination:   No results for input(s): NA, K, CL, CO2, GLUCOSE, BUN, CREATININE, CALCIUM, GFRNONAA, GFRAA in the last 8760 hours. CrCl cannot be calculated (Patient's most recent lab result is older than the maximum 21 days allowed.).  CMP Latest Ref Rng & Units 07/31/2018 07/30/2018 07/30/2018  Glucose 70 - 99 mg/dL 97 95 94  BUN 8 - 23 mg/dL 20 14 16   Creatinine 0.44 - 1.00 mg/dL 0.80 0.60 0.69  Sodium 135 - 145 mmol/L 140 138 140  Potassium 3.5 - 5.1 mmol/L 3.6 3.7 3.7  Chloride 98 - 111 mmol/L 108 107 108  CO2 22 - 32 mmol/L 22 - 24  Calcium 8.9 - 10.3 mg/dL 9.0 - 8.9  Total Protein 6.5 - 8.1 g/dL - - 6.3(L)  Total Bilirubin 0.3 - 1.2 mg/dL - - 0.7  Alkaline Phos 38 - 126 U/L - - 75  AST 15 - 41 U/L - - 12(L)  ALT 0 - 44 U/L - - 10   CBC Latest Ref Rng & Units 07/31/2018 07/30/2018 07/30/2018  WBC 4.0 - 10.5 K/uL 9.1 - 7.1  Hemoglobin 12.0 - 15.0 g/dL 13.2 13.3 12.7  Hematocrit 36.0 - 46.0 % 42.8 39.0 40.6  Platelets 150 - 400 K/uL 271 - 254   Lipid Panel     Component Value Date/Time    CHOL 142 07/31/2018 0655   TRIG 72 07/31/2018 0655   HDL 45 07/31/2018 0655   CHOLHDL 3.2 07/31/2018 0655   VLDL 14 07/31/2018 0655   LDLCALC 83 07/31/2018 0655   HEMOGLOBIN A1C Lab Results  Component Value Date   HGBA1C 5.0 07/31/2018   MPG 96.8 07/31/2018   TSH No results for input(s): TSH in the last 8760 hours.  External labs:   Lipid Panel completed 04/09/2019 HDL 38 MG/DL 04/09/2019 LDL 80.000 mg 04/09/2019 Cholesterol, total 143.000 m 04/09/2019 Triglycerides  127.000 04/09/2019  A1C 5.000 07/31/2018  Glucose Random 110.000 m 11/04/2019 MicroAlbumin Urine 9.000 04/25/2019 MicroAlbumin/Creat 16.8 MG/ 04/25/2019 BUN 18.000 mg 11/04/2019 Creatinine, Serum 0.700 mg/ 11/04/2019  TSH 2.620 micr 11/04/2019  Medications and allergies   Allergies  Allergen Reactions  . Codeine Nausea Only  . Epinephrine     Tachcardyia, chest pains tremors  . Tenormin [Atenolol] Rash     Current Outpatient Medications  Medication Instructions  . acetaminophen (TYLENOL) 1,000 mg, Oral, 3 times daily PRN  . apixaban (ELIQUIS) 5 mg, Oral, 2 times daily  . levothyroxine (SYNTHROID) 112 mcg, Oral, Daily before breakfast  . lidocaine (LMX) 4 % cream 1 application, Topical, As needed  . pantoprazole (PROTONIX) 40 mg, Oral, Daily PRN  . predniSONE (DELTASONE) 5 MG tablet prednisone 5 mg tablet  . Sennosides-Docusate Sodium (SENOKOT S PO) 1 tablet, Oral, As needed   Radiology:   No results found.  Cardiac Studies:   Echocardiogram 03/21/2018: Left ventricle cavity is normal in size. Moderate concentric hypertrophy of the left ventricle. Moderate decrease in global wall motion. Doppler evidence of grade II (pseudonormal) diastolic dysfunction, elevated LAP. Calculated EF 30%. Left atrial cavity is normal in size. Aneurysmal interatrial septum without PFO. Moderate (Grade II) aortic regurgitation. Mild mitral valve leaflet calcification. Study is technically inadequate to assess for mitral  stenosis.  Moderate (Grade II) mitral regurgitation. Mild tricuspid regurgitation. Estimated pulmonary artery systolic pressure 24  mmHg. Mild pulmonic regurgitation. Insignificant pericardial effusion. IVC is dilated with respiratory variation. Estimated RA pressure 8 mmHg.  Lexiscan Myoview stress test  04/23/2018: 1. The resting electrocardiogram demonstrated normal sinus rhythm, normal resting conduction and no resting arrhythmias.  Stress EKG is non-diagnostic for ischemia as it a pharmacologic stress using Lexiscan. Occasional PAC and PVC. The patient developed significant symptoms which included Chest Pain, Stomach pain, Headache.  2. The overall quality of the study is good. There is no evidence of abnormal lung activity. Stress and rest SPECT images demonstrate homogeneous tracer distribution throughout the myocardium. Gated SPECT imaging reveals diffuse global hypokinesis. The left ventricular ejection fraction was markedly depressed at (26%).   3. This is a high risk study due to severe LV systolic dysfunction. Findings may represent non ischemic cardiomyopathy.  Echocardiogram  07/30/2018 - Left ventricle: The cavity size was normal. There was moderate concentric hypertrophy. Systolic function assessment limited due to frequent PAC&'s. LVEF probably around 50%. Wall motion was normal; there were no regional wall motion abnormalities. Doppler parameters are consistent with abnormal left ventricular relaxation (grade 1 diastolic dysfunction). - Aortic valve: Mildly calcified annulus. Trileaflet valve. There was moderate regurgitation.  EKG  EKG 11/29/2019: Normal sinus rhythm with rate of 87 bpm, left atrial enlargement, left axis deviation, left intrafascicular block.  Poor R wave progression cannot exclude anteroseptal infarct old, LVH.  Nonspecific T abnormality.     09/24/2019: Normal sinus rhythm at 93 bpm with occasional PAC, left axis deviation, PRWP cannot exclude  anterior infarct old. ST abnormality in lateral leads, cannot exclude ischemia Compared to EKG 03/27/2019: Normal sinus rhythm at 87 bpm, 1 PAC, left axis deviation, PRWP cannot exclude anteroseptal infarct old. No evidence of ischemia.   Assessment     ICD-10-CM   1. Paroxysmal atrial fibrillation (HCC) CHA2DS2-VASc Score is 6  I48.0 EKG 12-Lead   CHA2DS2-VASc Score is 6.  -(CHF; HTN; vasc disease DM,  Female = 1; Age <65 =0; 65-74 = 1,  >75 =2; stroke = 2).  -(Yearly risk of stroke: Score  of 1=1.3; 2=2  2. Chronic diastolic CHF (congestive heart failure) (HCC)  I50.32 Pulse oximetry, overnight  3. Essential hypertension  I10   4. Orthostatic hypotension  I95.1   5. Shortness of breath  R06.02 Pulse oximetry, overnight     No orders of the defined types were placed in this encounter.   Medications Discontinued During This Encounter  Medication Reason  . diclofenac sodium (VOLTAREN) 1 % GEL Patient Preference  . meclizine (ANTIVERT) 25 MG tablet No longer needed (for PRN medications)    Recommendations:   Heidi Adams  is a 84 y.o. female  with bilateral hip replacement and knee replacement and left breast lumpectomy in 2018 without chemotherapy or radiation therapy for breast cancer and also remote hysterectomy due to uterine cancer in 1991, supine hypertension and orthostatic hypotension. She has chronic dizziness on standing. Has not been able to tolerate any BP medications due to severe dizzy spells.  There is documented A. fib on ER visit in 2017 at [facility], has not had known recurrence over the last 4 years.  She is high risk for cardioembolic event and continue to feel that she should be on anticoagulation.    She brings blood pressure recordings from home, systolic blood pressure have been around 150 mmHg.  I did not make any changes to medication as we have tried every medication even at the lowest dose she gets severely orthostatic and dizzy.  With regard to fatigue and  dyspnea, I have recommended that we check nocturnal oximetry.  She is presently on Eliquis and is tolerating this, today EKG reveals normal sinus rhythm.  Husband present and all questions answered.  I will see her back in 6 months.  I will follow up on the labs from PCP, she will need CBC and BMP.  Adrian Prows, MD, [facility] 11/29/2019, 11:38 AM [facility] [place] Pager: [phone] Office: [phone]

## 2019-11-29 ENCOUNTER — Other Ambulatory Visit: Payer: Self-pay

## 2019-11-29 ENCOUNTER — Ambulatory Visit: Payer: PPO | Admitting: Cardiology

## 2019-11-29 ENCOUNTER — Encounter: Payer: Self-pay | Admitting: Cardiology

## 2019-11-29 VITALS — BP 160/90 | HR 86 | Temp 97.6°F | Resp 16 | Ht 64.0 in | Wt 142.0 lb

## 2019-11-29 DIAGNOSIS — I10 Essential (primary) hypertension: Secondary | ICD-10-CM | POA: Diagnosis not present

## 2019-11-29 DIAGNOSIS — R0602 Shortness of breath: Secondary | ICD-10-CM | POA: Diagnosis not present

## 2019-11-29 DIAGNOSIS — I5032 Chronic diastolic (congestive) heart failure: Secondary | ICD-10-CM

## 2019-11-29 DIAGNOSIS — I480 Paroxysmal atrial fibrillation: Secondary | ICD-10-CM | POA: Diagnosis not present

## 2019-11-29 DIAGNOSIS — I951 Orthostatic hypotension: Secondary | ICD-10-CM | POA: Diagnosis not present

## 2019-11-29 DIAGNOSIS — G4734 Idiopathic sleep related nonobstructive alveolar hypoventilation: Secondary | ICD-10-CM

## 2019-12-03 DIAGNOSIS — M85841 Other specified disorders of bone density and structure, right hand: Secondary | ICD-10-CM | POA: Diagnosis not present

## 2019-12-03 DIAGNOSIS — M7989 Other specified soft tissue disorders: Secondary | ICD-10-CM | POA: Diagnosis not present

## 2019-12-03 DIAGNOSIS — M25431 Effusion, right wrist: Secondary | ICD-10-CM | POA: Diagnosis not present

## 2019-12-03 DIAGNOSIS — M85842 Other specified disorders of bone density and structure, left hand: Secondary | ICD-10-CM | POA: Diagnosis not present

## 2019-12-03 DIAGNOSIS — M150 Primary generalized (osteo)arthritis: Secondary | ICD-10-CM | POA: Diagnosis not present

## 2019-12-03 DIAGNOSIS — Z6824 Body mass index (BMI) 24.0-24.9, adult: Secondary | ICD-10-CM | POA: Diagnosis not present

## 2019-12-03 DIAGNOSIS — M0609 Rheumatoid arthritis without rheumatoid factor, multiple sites: Secondary | ICD-10-CM | POA: Diagnosis not present

## 2019-12-03 DIAGNOSIS — M2550 Pain in unspecified joint: Secondary | ICD-10-CM | POA: Diagnosis not present

## 2019-12-04 DIAGNOSIS — L853 Xerosis cutis: Secondary | ICD-10-CM | POA: Diagnosis not present

## 2019-12-04 DIAGNOSIS — L308 Other specified dermatitis: Secondary | ICD-10-CM | POA: Diagnosis not present

## 2019-12-04 DIAGNOSIS — Z85828 Personal history of other malignant neoplasm of skin: Secondary | ICD-10-CM | POA: Diagnosis not present

## 2019-12-04 DIAGNOSIS — L720 Epidermal cyst: Secondary | ICD-10-CM | POA: Diagnosis not present

## 2019-12-04 DIAGNOSIS — D692 Other nonthrombocytopenic purpura: Secondary | ICD-10-CM | POA: Diagnosis not present

## 2019-12-16 NOTE — Progress Notes (Signed)
Nocturnal oximetry 12/12/2019: SpO2 <88%: 19 minutes SpO2 <89%: 50 minutes Oxygen desaturation events: 550 Oxygen desaturation index: 56 Highest SPO2 99%, lowest SPO2 85%. Recommendation/consideration: Patient qualifies for nocturnal oxygen supplementation per Medicare guidelines Group 1.

## 2019-12-16 NOTE — Progress Notes (Addendum)
Please order O2 at 3 L per min at night.    ICD-10-CM   1. Paroxysmal atrial fibrillation (HCC) CHA2DS2-VASc Score is 6  I48.0 EKG 12-Lead    For home use only DME oxygen   CHA2DS2-VASc Score is 6.  -(CHF; HTN; vasc disease DM,  Female = 1; Age <65 =0; 65-74 = 1,  >75 =2; stroke = 2).  -(Yearly risk of stroke: Score of 1=1.3; 2=2  2. Chronic diastolic CHF (congestive heart failure) (HCC)  I50.32 Pulse oximetry, overnight    For home use only DME oxygen  3. Essential hypertension  I10   4. Orthostatic hypotension  I95.1   5. Shortness of breath  R06.02 Pulse oximetry, overnight    For home use only DME oxygen  6. Nocturnal hypoxemia  G47.34 For home use only DME oxygen

## 2019-12-17 NOTE — Progress Notes (Signed)
Done

## 2019-12-17 NOTE — Addendum Note (Signed)
Addended by: Kela Millin on: 12/17/2019 10:40 AM   Modules accepted: Orders

## 2019-12-18 DIAGNOSIS — D692 Other nonthrombocytopenic purpura: Secondary | ICD-10-CM | POA: Diagnosis not present

## 2019-12-18 DIAGNOSIS — Z85828 Personal history of other malignant neoplasm of skin: Secondary | ICD-10-CM | POA: Diagnosis not present

## 2019-12-18 DIAGNOSIS — L438 Other lichen planus: Secondary | ICD-10-CM | POA: Diagnosis not present

## 2019-12-18 DIAGNOSIS — L853 Xerosis cutis: Secondary | ICD-10-CM | POA: Diagnosis not present

## 2019-12-18 DIAGNOSIS — L821 Other seborrheic keratosis: Secondary | ICD-10-CM | POA: Diagnosis not present

## 2019-12-18 DIAGNOSIS — L570 Actinic keratosis: Secondary | ICD-10-CM | POA: Diagnosis not present

## 2019-12-31 DIAGNOSIS — M7989 Other specified soft tissue disorders: Secondary | ICD-10-CM | POA: Diagnosis not present

## 2019-12-31 DIAGNOSIS — M150 Primary generalized (osteo)arthritis: Secondary | ICD-10-CM | POA: Diagnosis not present

## 2019-12-31 DIAGNOSIS — Z6824 Body mass index (BMI) 24.0-24.9, adult: Secondary | ICD-10-CM | POA: Diagnosis not present

## 2019-12-31 DIAGNOSIS — M0609 Rheumatoid arthritis without rheumatoid factor, multiple sites: Secondary | ICD-10-CM | POA: Diagnosis not present

## 2019-12-31 DIAGNOSIS — M85841 Other specified disorders of bone density and structure, right hand: Secondary | ICD-10-CM | POA: Diagnosis not present

## 2019-12-31 DIAGNOSIS — M85842 Other specified disorders of bone density and structure, left hand: Secondary | ICD-10-CM | POA: Diagnosis not present

## 2019-12-31 DIAGNOSIS — M25431 Effusion, right wrist: Secondary | ICD-10-CM | POA: Diagnosis not present

## 2019-12-31 DIAGNOSIS — M2550 Pain in unspecified joint: Secondary | ICD-10-CM | POA: Diagnosis not present

## 2020-01-02 ENCOUNTER — Other Ambulatory Visit: Payer: Self-pay

## 2020-01-02 ENCOUNTER — Ambulatory Visit
Admission: RE | Admit: 2020-01-02 | Discharge: 2020-01-02 | Disposition: A | Discharge status: Home / Self Care | Payer: PPO | Source: Ambulatory Visit | Attending: Hematology and Oncology | Admitting: Hematology and Oncology

## 2020-01-02 DIAGNOSIS — R928 Other abnormal and inconclusive findings on diagnostic imaging of breast: Secondary | ICD-10-CM | POA: Diagnosis not present

## 2020-01-02 DIAGNOSIS — Z853 Personal history of malignant neoplasm of breast: Secondary | ICD-10-CM

## 2020-01-02 DIAGNOSIS — Z9889 Other specified postprocedural states: Secondary | ICD-10-CM

## 2020-01-02 IMAGING — MG DIGITAL DIAGNOSTIC BILAT W/ TOMO W/ CAD
6 of 9 series · 6 of 25 positions shown · non-contrast
Comparison: Previous exam(s).

CLINICAL DATA: Status post left lumpectomy for breast cancer in
7405.

EXAM:
DIGITAL DIAGNOSTIC BILATERAL MAMMOGRAM WITH CAD AND TOMO

[L CC]
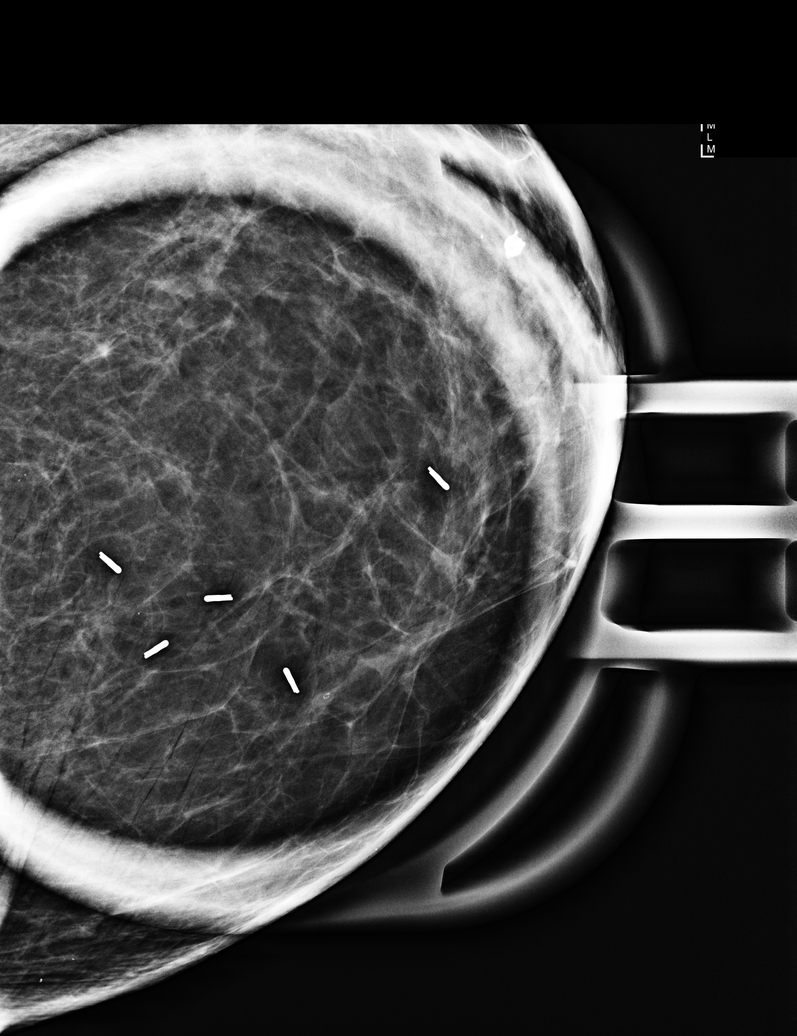

[R MLO synth-2D]
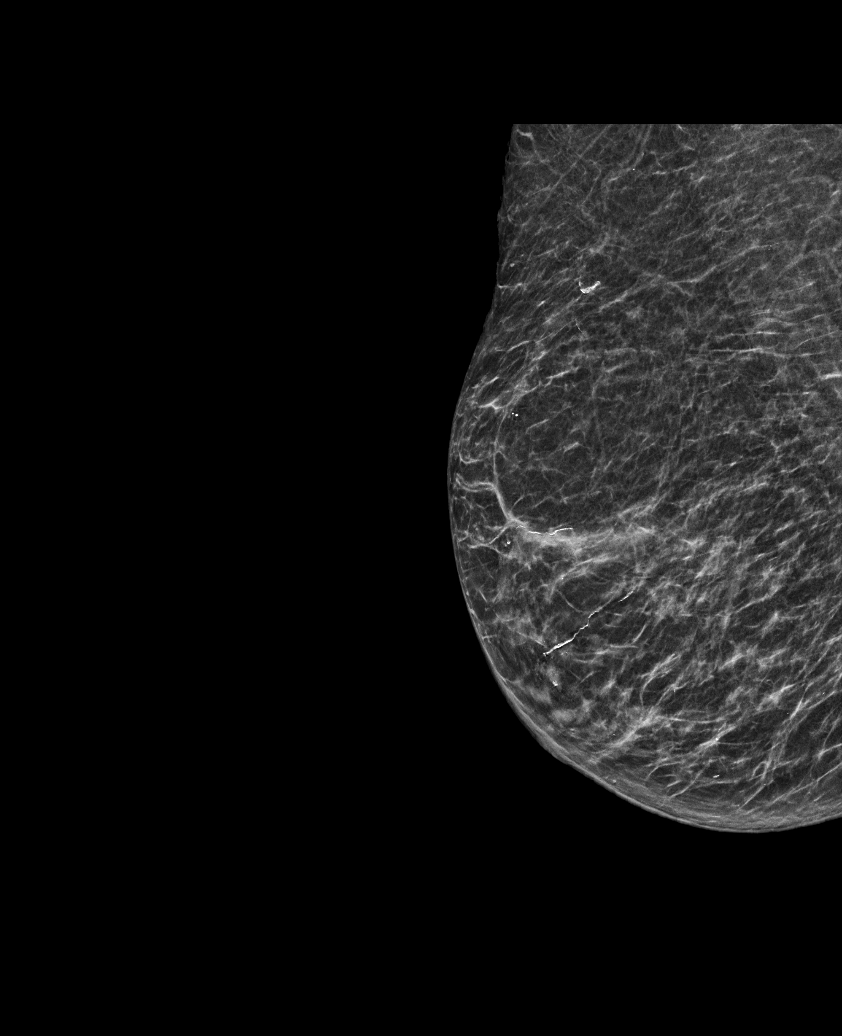

[L CC synth-2D]
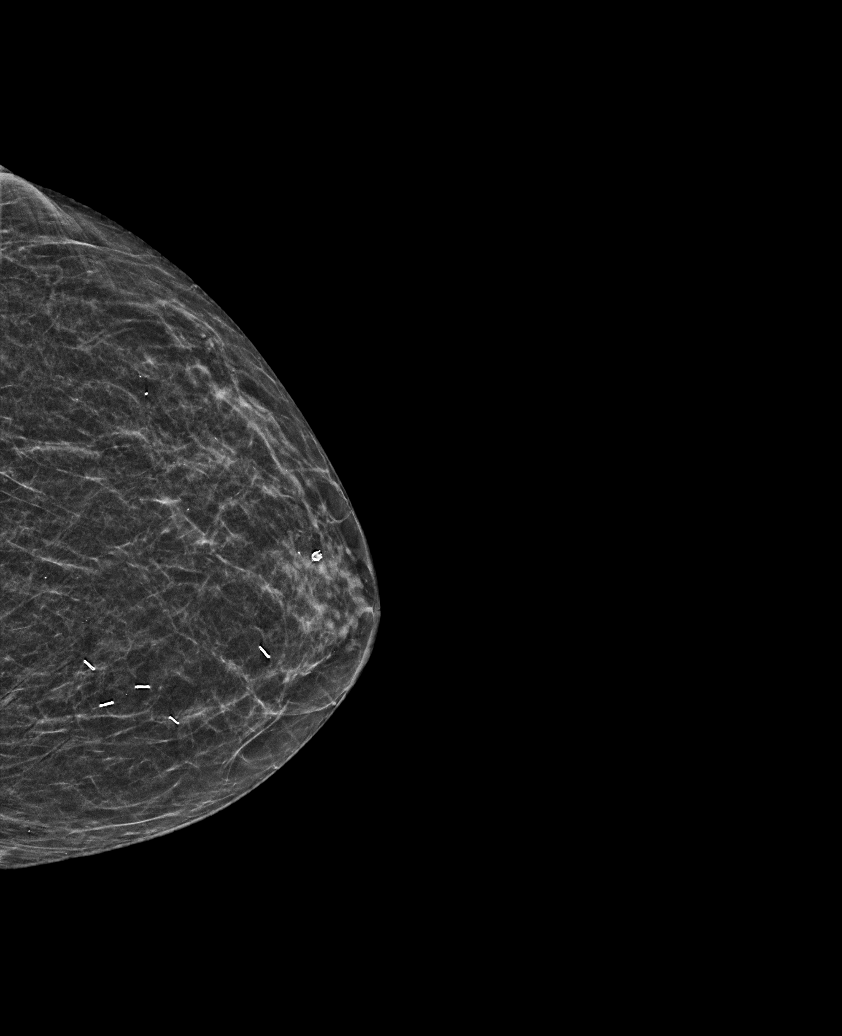

[L MLO synth-2D]
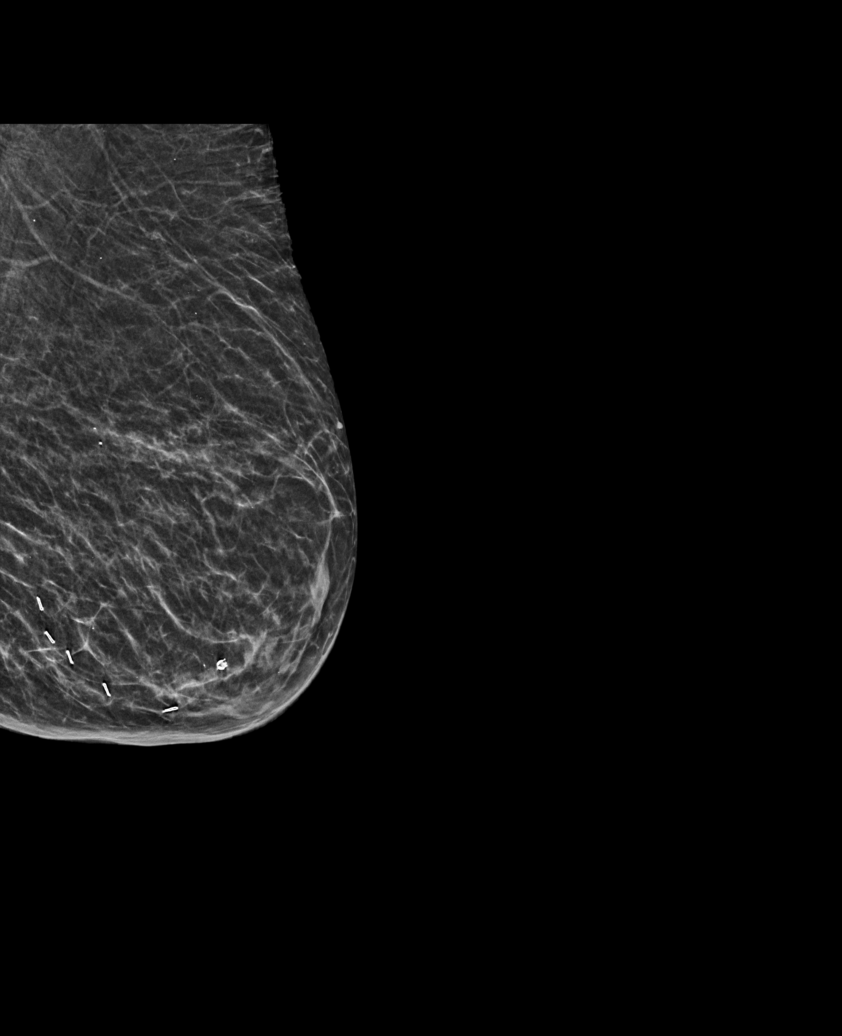

[R CC synth-2D]
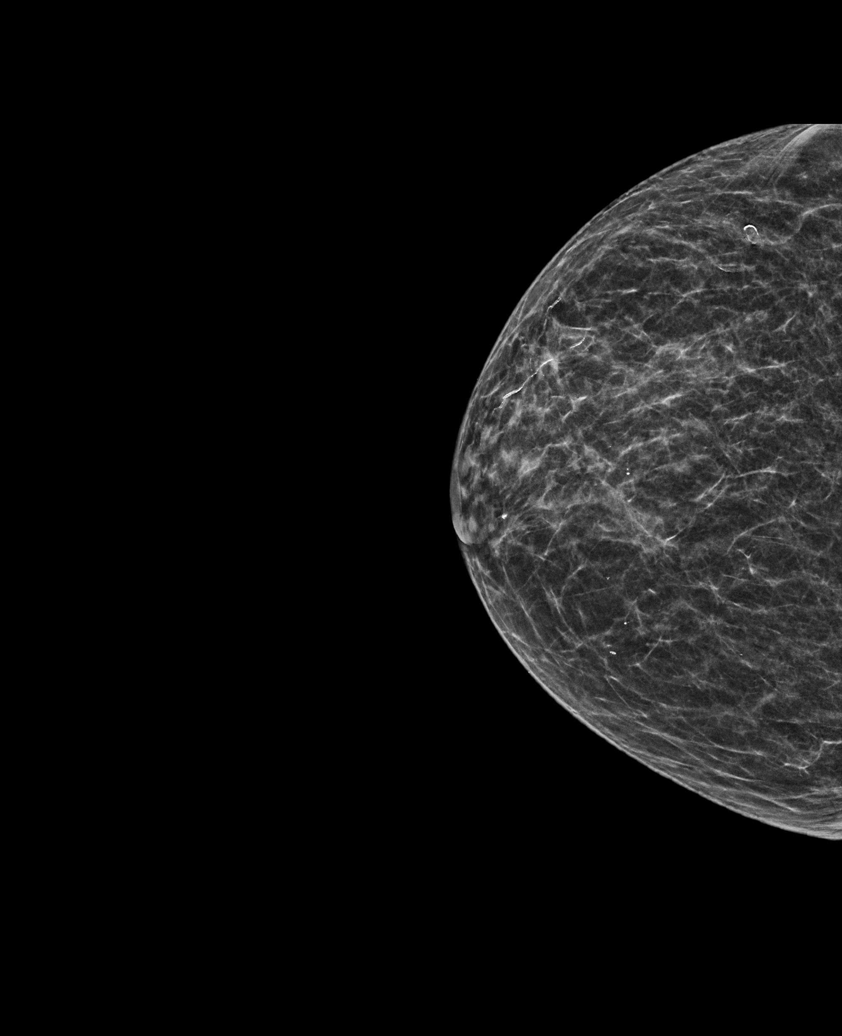

[L MLO tomo · tomo slice 22/43.0]
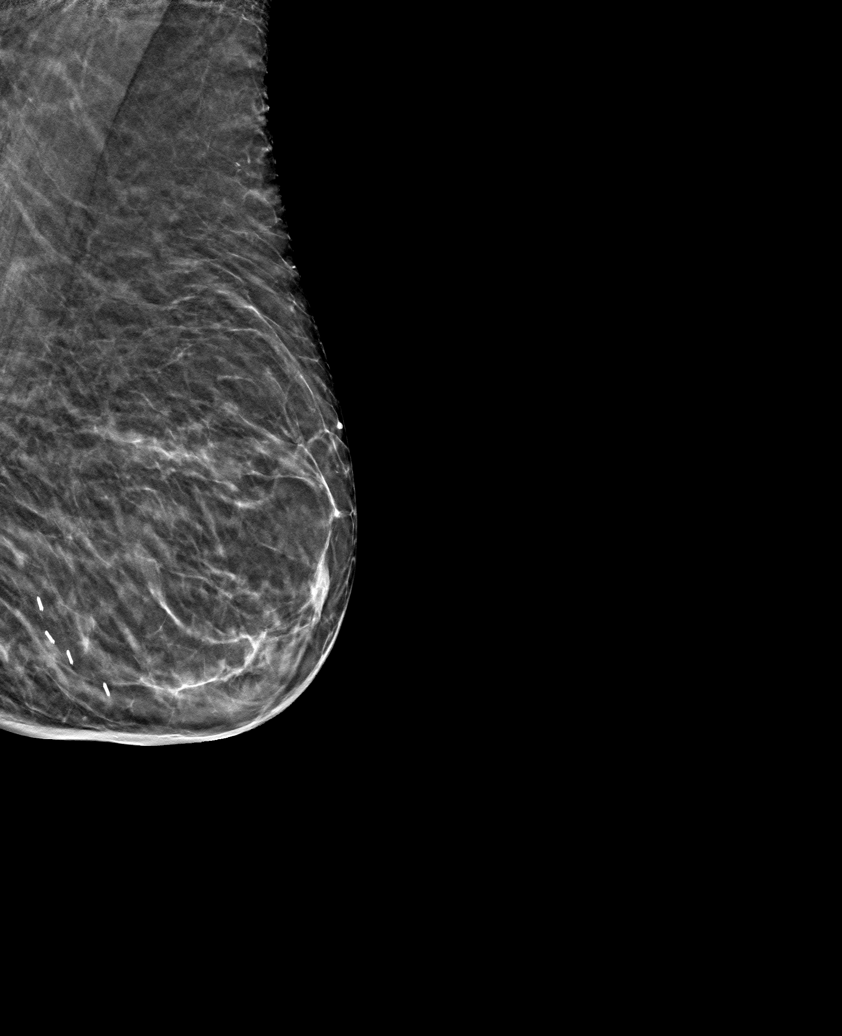

[6 of 25 positions shown; findings below may reference images not displayed]

ACR Breast Density Category b: There are scattered areas of
fibroglandular density.
FINDINGS: Stable post lumpectomy changes on the left. Mildly prominent right
axillary lymph nodes without significant change since 12/15/2016. No
interval findings suspicious for malignancy in either breast.

Mammographic images were processed with CAD.
IMPRESSION: No evidence of malignancy.

RECOMMENDATION:
Bilateral diagnostic mammogram in 1 year.

I have discussed the findings and recommendations with the patient.
If applicable, a reminder letter will be sent to the patient
regarding the next appointment.

BI-RADS CATEGORY  2: Benign.

## 2020-01-10 ENCOUNTER — Ambulatory Visit: Payer: PPO | Admitting: Cardiology

## 2020-01-10 ENCOUNTER — Telehealth: Payer: Self-pay

## 2020-01-10 ENCOUNTER — Other Ambulatory Visit: Payer: Self-pay

## 2020-01-10 ENCOUNTER — Encounter: Payer: Self-pay | Admitting: Cardiology

## 2020-01-10 VITALS — BP 198/110 | HR 94 | Resp 16 | Ht 64.0 in | Wt 143.0 lb

## 2020-01-10 DIAGNOSIS — I5031 Acute diastolic (congestive) heart failure: Secondary | ICD-10-CM

## 2020-01-10 DIAGNOSIS — R0602 Shortness of breath: Secondary | ICD-10-CM | POA: Diagnosis not present

## 2020-01-10 DIAGNOSIS — G4734 Idiopathic sleep related nonobstructive alveolar hypoventilation: Secondary | ICD-10-CM

## 2020-01-10 DIAGNOSIS — I10 Essential (primary) hypertension: Secondary | ICD-10-CM | POA: Diagnosis not present

## 2020-01-10 MED ORDER — FUROSEMIDE 40 MG PO TABS: 40.0000 mg | ORAL_TABLET | Freq: Every day | ORAL | 2 refills | Status: DC | PRN

## 2020-01-10 NOTE — Progress Notes (Signed)
Primary Physician/Referring:  [name], MD  Patient ID: Heidi Adams, female    DOB: June 14, 1936, 84 y.o.   MRN: [id number]  Chief Complaint  Patient presents with  . Shortness of Breath  . Follow-up    4 week   HPI:    Heidi Adams  is a 84 y.o. female  with bilateral hip replacement and knee replacement and left breast lumpectomy in 2018 without chemotherapy or radiation therapy for breast cancer and also remote hysterectomy due to uterine cancer in 1991, supine hypertension and orthostatic hypotension. She has chronic dizziness on standing. Has not been able to tolerate any BP medications due to severe dizzy spells.  There is documented A. fib on ER visit in 2017 at [facility], has not had known recurrence over the last 4 years.  This is a 105-month office visit, states that she is doing well and except for arthritis no specific complaints.  She is still having occasional episodes of dizziness when she suddenly stands up.  She called me today to be seen on a urgent basis due to acute onset of shortness of breath this morning. No associated chest pain, she has been using nocturnal oxygen on a regular basis, about a month ago she was started on prednisone for arthritis.  Past Medical History:  Diagnosis Date  . Allergy   . Anemia    hx of anemia  . Arthritis   . Breast cancer ([place])    Left breast  . Cancer ([place])    skin ca nose  . Chronic combined systolic and diastolic CHF (congestive heart failure) ([place]) 09/22/2018  . Chronic combined systolic and diastolic CHF (congestive heart failure) ([place]) 09/22/2018  . Chronic combined systolic and diastolic CHF (congestive heart failure) ([place]) 09/22/2018  . Dysrhythmia    went into Atrial Fibrillation after last surgery, HAS PALPITATIONS  . History of skin cancer   . Hypertension   . Hypothyroidism   . PVC (premature ventricular contraction)   . Thyroiditis 1973   Past Surgical History:  Procedure Laterality Date  . ABDOMINAL  HYSTERECTOMY  89   TAH BSO  . APPENDECTOMY    . BREAST LUMPECTOMY Left 05/30/2017  . BREAST LUMPECTOMY WITH RADIOACTIVE SEED LOCALIZATION Left 05/30/2017   Procedure: LEFT BREAST LUMPECTOMY WITH RADIOACTIVE SEED LOCALIZATION;  Surgeon: Excell Seltzer, MD;  Location: [place];  Service: General;  Laterality: Left;  . CATARACT EXTRACTION  09/10/2018   Right eye  . CHOLECYSTECTOMY     dr Georgia Lopes  . ELBOW SURGERY    . EYE SURGERY     "on my eyelids" years ago  . KNEE ARTHROSCOPY     left  . THYROIDECTOMY  1973  . TONSILLECTOMY    . TOTAL HIP ARTHROPLASTY Right 05/10/2016   Procedure: TOTAL HIP ARTHROPLASTY ANTERIOR APPROACH;  Surgeon: Paralee Cancel, MD;  Location: [facility];  Service: Orthopedics;  Laterality: Right;  . TOTAL HIP ARTHROPLASTY Left 01/10/2017   Procedure: LEFT TOTAL HIP ARTHROPLASTY ANTERIOR APPROACH;  Surgeon: Paralee Cancel, MD;  Location: [facility];  Service: Orthopedics;  Laterality: Left;  . TOTAL KNEE ARTHROPLASTY Left 09/28/2015   Procedure: LEFT TOTAL KNEE ARTHROPLASTY;  Surgeon: Paralee Cancel, MD;  Location: [facility];  Service: Orthopedics;  Laterality: Left;  . TOTAL KNEE ARTHROPLASTY Right 08/09/2016   Procedure: RIGHT TOTAL KNEE ARTHROPLASTY;  Surgeon: Paralee Cancel, MD;  Location: [facility];  Service: Orthopedics;  Laterality: Right;  Adductor Block  . Total Left hip arthroplasty  01/10/17 Dr. Alvan Dame   Family History  Problem Relation Age of Onset  . Diabetes Father   . Hypertension Father   . Stroke Father   . Breast cancer Cousin   . Pneumonia Sister     Social History   Tobacco Use  . Smoking status: Former Smoker    Packs/day: 1.00    Years: 20.00    Pack years: 20.00    Types: Cigarettes    Quit date: 07/26/1987    Years since quitting: 32.4  . Smokeless tobacco: Never Used  Substance Use Topics  . Alcohol use: No    Alcohol/week: 0.0 standard drinks   Marital Status: Married  ROS  Review of Systems  Constitutional: Positive for  malaise/fatigue. Negative for weight gain.  Cardiovascular: Positive for dyspnea on exertion and leg swelling (intermittent, improved with support stockings). Negative for syncope.  Respiratory: Negative for shortness of breath.   Musculoskeletal: Positive for arthritis and joint pain. Negative for joint swelling.   Objective  Blood pressure (!) 198/110, pulse 94, resp. rate 16, height 5\' 4"  (1.626 m), weight 143 lb (64.9 kg), last menstrual period 07/25/1988, SpO2 95 %.  Vitals with BMI 01/10/2020 11/29/2019 11/29/2019  Height 5\' 4"  - 5\' 4"   Weight 143 lbs - 142 lbs  BMI 86.57 - 84.69  Systolic 629 528 413  Diastolic 244 90 94  Pulse 94 - 86     Physical Exam Constitutional:      General: She is not in acute distress.    Comments: Moderately build, frail  Cardiovascular:     Rate and Rhythm: Normal rate and regular rhythm.     Pulses: Intact distal pulses.     Heart sounds: No murmur heard.  No gallop.      Comments: No leg edema. No JVD.   Pulmonary:     Effort: Pulmonary effort is normal. No accessory muscle usage or respiratory distress.     Breath sounds: Examination of the right-lower field reveals rhonchi. Examination of the left-lower field reveals rhonchi. Rhonchi present.  Abdominal:     General: Bowel sounds are normal.     Palpations: Abdomen is soft.    Laboratory examination:   No results for input(s): NA, K, CL, CO2, GLUCOSE, BUN, CREATININE, CALCIUM, GFRNONAA, GFRAA in the last 8760 hours. CrCl cannot be calculated (Patient's most recent lab result is older than the maximum 21 days allowed.).  CMP Latest Ref Rng & Units 07/31/2018 07/30/2018 07/30/2018  Glucose 70 - 99 mg/dL 97 95 94  BUN 8 - 23 mg/dL 20 14 16   Creatinine 0.44 - 1.00 mg/dL 0.80 0.60 0.69  Sodium 135 - 145 mmol/L 140 138 140  Potassium 3.5 - 5.1 mmol/L 3.6 3.7 3.7  Chloride 98 - 111 mmol/L 108 107 108  CO2 22 - 32 mmol/L 22 - 24  Calcium 8.9 - 10.3 mg/dL 9.0 - 8.9  Total Protein 6.5 - 8.1 g/dL - -  6.3(L)  Total Bilirubin 0.3 - 1.2 mg/dL - - 0.7  Alkaline Phos 38 - 126 U/L - - 75  AST 15 - 41 U/L - - 12(L)  ALT 0 - 44 U/L - - 10   CBC Latest Ref Rng & Units 07/31/2018 07/30/2018 07/30/2018  WBC 4.0 - 10.5 K/uL 9.1 - 7.1  Hemoglobin 12.0 - 15.0 g/dL 13.2 13.3 12.7  Hematocrit 36 - 46 % 42.8 39.0 40.6  Platelets 150 - 400 K/uL 271 - 254   Lipid Panel  Component Value Date/Time   CHOL 142 07/31/2018 0655   TRIG 72 07/31/2018 0655   HDL 45 07/31/2018 0655   CHOLHDL 3.2 07/31/2018 0655   VLDL 14 07/31/2018 0655   LDLCALC 83 07/31/2018 0655   HEMOGLOBIN A1C Lab Results  Component Value Date   HGBA1C 5.0 07/31/2018   MPG 96.8 07/31/2018   TSH No results for input(s): TSH in the last 8760 hours.  External labs:   Lipid Panel completed 04/09/2019 HDL 38 MG/DL 04/09/2019 LDL 80.000 mg 04/09/2019 Cholesterol, total 143.000 m 04/09/2019 Triglycerides 127.000 04/09/2019  A1C 5.000 07/31/2018  Glucose Random 110.000 m 11/04/2019 MicroAlbumin Urine 9.000 04/25/2019 MicroAlbumin/Creat 16.8 MG/ 04/25/2019 BUN 18.000 mg 11/04/2019 Creatinine, Serum 0.700 mg/ 11/04/2019  TSH 2.620 micr 11/04/2019  Medications and allergies   Allergies  Allergen Reactions  . Codeine Nausea Only  . Epinephrine     Tachcardyia, chest pains tremors  . Tenormin [Atenolol] Rash    Current Outpatient Medications  Medication Instructions  . acetaminophen (TYLENOL) 1,000 mg, Oral, 3 times daily PRN  . apixaban (ELIQUIS) 5 mg, Oral, 2 times daily  . furosemide (LASIX) 40 mg, Oral, Daily PRN  . levothyroxine (SYNTHROID) 112 mcg, Oral, Daily before breakfast  . lidocaine (LMX) 4 % cream 1 application, Topical, As needed  . pantoprazole (PROTONIX) 40 mg, Oral, Daily PRN  . predniSONE (DELTASONE) 5 mg, Daily with breakfast, Arthritis   Radiology:   No results found.  Cardiac Studies:   Echocardiogram 03/21/2018: Left ventricle cavity is normal in size. Moderate concentric hypertrophy of the left  ventricle. Moderate decrease in global wall motion. Doppler evidence of grade II (pseudonormal) diastolic dysfunction, elevated LAP. Calculated EF 30%. Left atrial cavity is normal in size. Aneurysmal interatrial septum without PFO. Moderate (Grade II) aortic regurgitation. Mild mitral valve leaflet calcification. Study is technically inadequate to assess for mitral stenosis.  Moderate (Grade II) mitral regurgitation. Mild tricuspid regurgitation. Estimated pulmonary artery systolic pressure 24  mmHg. Mild pulmonic regurgitation. Insignificant pericardial effusion. IVC is dilated with respiratory variation. Estimated RA pressure 8 mmHg.  Lexiscan Myoview stress test  04/23/2018: 1. The resting electrocardiogram demonstrated normal sinus rhythm, normal resting conduction and no resting arrhythmias.  Stress EKG is non-diagnostic for ischemia as it a pharmacologic stress using Lexiscan. Occasional PAC and PVC. The patient developed significant symptoms which included Chest Pain, Stomach pain, Headache.  2. The overall quality of the study is good. There is no evidence of abnormal lung activity. Stress and rest SPECT images demonstrate homogeneous tracer distribution throughout the myocardium. Gated SPECT imaging reveals diffuse global hypokinesis. The left ventricular ejection fraction was markedly depressed at (26%).   3. This is a high risk study due to severe LV systolic dysfunction. Findings may represent non ischemic cardiomyopathy.  Echocardiogram  07/30/2018 - Left ventricle: The cavity size was normal. There was moderate concentric hypertrophy. Systolic function assessment limited due to frequent PAC&'s. LVEF probably around 50%. Wall motion was normal; there were no regional wall motion abnormalities. Doppler parameters are consistent with abnormal left ventricular relaxation (grade 1 diastolic dysfunction). - Aortic valve: Mildly calcified annulus. Trileaflet valve. There was  moderate regurgitation.  EKG  EKG 01/10/2020: Normal sinus rhythm with rate of 95 bpm, left axis deviation, left anterior fascicular block.  Poor R wave progression, cannot exclude anteroseptal infarct old.  No evidence of ischemia. No significant change from EKG 11/29/2019    09/24/2019: Normal sinus rhythm at 93 bpm with occasional PAC, left axis deviation, PRWP cannot exclude  anterior infarct old. ST abnormality in lateral leads, cannot exclude ischemia   Assessment     ICD-10-CM   1. Shortness of breath  R06.02 EKG 12-Lead  2. Acute diastolic heart failure (HCC)  I50.31 furosemide (LASIX) 40 MG tablet  3. Essential hypertension  I10   4. Nocturnal hypoxemia  G47.34     Meds ordered this encounter  Medications  . furosemide (LASIX) 40 MG tablet    Sig: Take 1 tablet (40 mg total) by mouth daily as needed for fluid (Shortness of breath).    Dispense:  30 tablet    Refill:  2    Medications Discontinued During This Encounter  Medication Reason  . Sennosides-Docusate Sodium (SENOKOT S PO) Patient Preference    Recommendations:   Heidi Adams  is a 84 y.o. female  with bilateral hip replacement and knee replacement and left breast lumpectomy in 2018 without chemotherapy or radiation therapy for breast cancer and also remote hysterectomy due to uterine cancer in 1991, supine hypertension and orthostatic hypotension. She has chronic dizziness on standing. Has not been able to tolerate any BP medications due to severe dizzy spells.  There is documented A. fib on ER visit in 2017 at [facility], has not had known recurrence over the last 4 years.  She is high risk for cardioembolic event and continue to feel that she should be on anticoagulation.    She called me today to be seen on a urgent basis due to acute onset of shortness of breath this morning. On exam she has very faint bibasilar crackles, S4 gallop, but no JVD or leg edema. Suspect she has acute diastolic heart failure. I will  start her back on furosemide 40 mg a day for the next 2 to 3 days and she can use it on a as needed basis. Patient is extremely careful with her medications. Unless her symptoms do not resolve, I will see her back in 6 months as previously scheduled.  Pulmonary embolism is unlikely, she is presently on full dose anticoagulation with Eliquis for proximal atrial fibrillation, there is no leg edema or evidence of DVT by physical examination. Her labs including CBC and BMP have been stable, renal function is also remained stable.  Adrian Prows, MD, [facility] 01/10/2020, 12:57 PM [facility] [place] Pager: [phone] Office: [phone]

## 2020-01-16 ENCOUNTER — Other Ambulatory Visit: Payer: Self-pay

## 2020-01-16 ENCOUNTER — Ambulatory Visit: Payer: PPO | Admitting: Sports Medicine

## 2020-01-16 DIAGNOSIS — R269 Unspecified abnormalities of gait and mobility: Secondary | ICD-10-CM | POA: Diagnosis not present

## 2020-01-16 DIAGNOSIS — M2142 Flat foot [pes planus] (acquired), left foot: Secondary | ICD-10-CM | POA: Diagnosis not present

## 2020-01-16 DIAGNOSIS — M25522 Pain in left elbow: Secondary | ICD-10-CM | POA: Diagnosis not present

## 2020-01-16 DIAGNOSIS — M2141 Flat foot [pes planus] (acquired), right foot: Secondary | ICD-10-CM

## 2020-01-16 MED ORDER — OXYCODONE-ACETAMINOPHEN 5-325 MG PO TABS: 1.0000 | ORAL_TABLET | Freq: Four times a day (QID) | ORAL | 0 refills | Status: AC | PRN

## 2020-01-16 MED ORDER — HYDROCODONE-ACETAMINOPHEN 5-325 MG PO TABS: 1.0000 | ORAL_TABLET | Freq: Four times a day (QID) | ORAL | Status: DC | PRN

## 2020-01-16 NOTE — Assessment & Plan Note (Addendum)
Patient with left elbow pain.  Has had recent surgery 1 year ago.  There is likely component of arthritis.  Is unable to fully extend elbow.  Advised to use bracing for compression to see if this helps with pain.  Advised home exercise program as well.  Follow-up if no improvement.  WE discussed the risks I gave her 12 percocet to use 1/2 to 1 when pain is too severe to sleep.

## 2020-01-16 NOTE — Assessment & Plan Note (Signed)
Patient with significant pes planus and pronation of feet.  This is likely what is causing her right foot pain.  I think she would benefit from orthotic use.  Custom orthotics made by Dr. Oneida Alar during visit.  Also advised using Voltaren gel.  Follow-up if no improvement.

## 2020-01-16 NOTE — Assessment & Plan Note (Signed)
With marked pronation and subtalar subluxation needs orthotic support  Patient was fitted for a : standard, cushioned, semi-rigid orthotic. The orthotic was heated and afterward the patient stood on the orthotic blank positioned on the orthotic stand. The patient was positioned in subtalar neutral position and 10 degrees of ankle dorsiflexion in a weight bearing stance. After completion of molding, a stable base was applied to the orthotic blank. The blank was ground to a stable position for weight bearing. Size: 7 red EVA Base: blue foam Posting: none Additional orthotic padding:none  Note her gait improved after orthotics and she looked more stable Wear these as much as possible Still high risk for falls

## 2020-01-16 NOTE — Progress Notes (Signed)
Heidi Adams is a 84 y.o. female who presents to [facility] today for the following:  Right foot pain Patient presenting for right foot pain.  Pain is been present x1 month.  States she feels like her arches have fallen and ever since her pain has worsened.  Around a month ago she was started on Eliquis and had to stop her Advil.  Reports worsening pain since then.  Previously had orthotics dated 58s but has not use them since.  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   Feels like she is walking on a ball.  Reports that she feels like she will fall due to weakness.  Does report edema as well.  Has seen another sports medicine physician for this and was told to see rheumatology who put her on prednisone. Prednisone has helped the pain but difficult to do normal walking.  Left elbow pain Patient also presents with left elbow pain.  States that she had surgery on her left elbow 1 year ago and was told she had bone-on-bone arthritis.  Has been taking Tylenol for this since.  Also using a lidocaine rub which helps.  Reports swelling of her elbow and also bruising.  Thinks her bruising secondary to using a blood pressure cuff at the doctor's office yesterday.  She can move her elbow but with pain.  PMH reviewed. A fib, pes planus   ROS as above and - Note she has trouble trying to walk up a step. Feels unsteady and with foot pain thinks she might fall. Foot pain and elbow pain waken her at night and unable to sleep Medications reviewed.  Exam:  BP (!) 138/92   Ht 5\' 4"  (1.626 m)   Wt 138 lb (62.6 kg)   LMP 07/25/1988 Comment: full   BMI 23.69 kg/m  Gen: Well NAD Cardio: irregular rhythm  MSK: Foot:Right Inspection:  Subluxation of subtalar joint.  This rotates the foot outward. Swelling over midfoot and some into dorsal forefoot.Marland Kitchen  No erythema, or bruising.  Flattened longitudinal and transverse arches  Mild bossing of TMT joints Splaying of toes  Left  foot Loss of long arch but not severe like on RT Mild loss of transverse arch Splaying of toes Minimal TTP over forefoot plantar Palpation: TTP of plantar aspect of foot, worse on medial  ROM: Full  ROM of the ankle.  Strength: 5/5 strength ankle in all planes Neurovascular: N/V intact distally in the lower extremity  Special tests: Negative anterior drawer. Neg talar tilt   Gait - subtalar subluxation noted on gait with hyperpronation  - splayed toes  Antalgic strike on RT foot and walks with mild ankle dorsiflexion  MM DIAG BREAST TOMO BILATERAL  Result Date: 01/02/2020 CLINICAL DATA:  Status post left lumpectomy for breast cancer in 2018. EXAM: DIGITAL DIAGNOSTIC BILATERAL MAMMOGRAM WITH CAD AND TOMO COMPARISON:  Previous exam(s). ACR Breast Density Category b: There are scattered areas of fibroglandular density. FINDINGS: Stable post lumpectomy changes on the left. Mildly prominent right axillary lymph nodes without significant change since 12/15/2016. No interval findings suspicious for malignancy in either breast. Mammographic images were processed with CAD. IMPRESSION: No evidence of malignancy. RECOMMENDATION: Bilateral diagnostic mammogram in 1 year. I have discussed the findings and recommendations with the patient. If applicable, a reminder letter will be sent to the patient regarding the next appointment. BI-RADS CATEGORY  2: Benign. Electronically Signed   By: Claudie Revering  M.D.   On: 01/02/2020 10:38     Assessment and Plan: 1) Pes planus Patient with significant pes planus and pronation of feet.  This is likely what is causing her right foot pain.  I think she would benefit from orthotic use.  Custom orthotics made by Dr. Oneida Alar during visit.  Also advised using Voltaren gel.  Follow-up if no improvement.  Left elbow pain Patient with left elbow pain.  Has had recent surgery 1 year ago.  There is likely component of arthritis.  Is unable to fully extend elbow.  Advised to  use bracing for compression to see if this helps with pain.  Advised home exercise program as well.  Follow-up if no improvement.   Dalphine Handing, PGY-3 [facility] Resident  01/16/2020 11:32 AM   I observed and examined the patient with the resident and agree with assessment and plan.  Note reviewed and modified by me. Ila Mcgill, MD

## 2020-01-26 DIAGNOSIS — I5032 Chronic diastolic (congestive) heart failure: Secondary | ICD-10-CM | POA: Diagnosis not present

## 2020-01-26 DIAGNOSIS — I480 Paroxysmal atrial fibrillation: Secondary | ICD-10-CM | POA: Diagnosis not present

## 2020-01-26 DIAGNOSIS — I10 Essential (primary) hypertension: Secondary | ICD-10-CM | POA: Diagnosis not present

## 2020-01-28 NOTE — Telephone Encounter (Signed)
Yes for another 2-3 days or until swelling is gone

## 2020-01-28 NOTE — Telephone Encounter (Signed)
Called pt to inform her about the message above. Pt mention she does not want to use the oxygen anymore

## 2020-01-28 NOTE — Telephone Encounter (Signed)
Pt mention that she does not like to use her oxygen tank at night. Pt would like to know can she stop using her oxygen at night

## 2020-01-28 NOTE — Telephone Encounter (Signed)
Pt called to inform us that she has still been with SOB. Pt mention she has been taking Lasix and would like to know what should she do. Denies headaches nor dizziness. Pt has also mention she has Pressure on chest yesterday and today. Pt mention she loss 5 lb due to fluids.

## 2020-01-28 NOTE — Telephone Encounter (Signed)
Pt would like to know should she continue to take lasix

## 2020-01-28 NOTE — Telephone Encounter (Signed)
It has helped her from heart standpoint. But if she does not want to use, I can discontinue this

## 2020-01-28 NOTE — Telephone Encounter (Signed)
With fluid loss, she should start feeling better again, just give a couple days

## 2020-01-29 ENCOUNTER — Telehealth: Payer: Self-pay

## 2020-01-29 NOTE — Telephone Encounter (Signed)
No I had sent message to [facility]. Will cancel cancellation order!!

## 2020-01-29 NOTE — Telephone Encounter (Signed)
Pt called to inform me that she has changed her mind about the oxygen tank she would like to continue using it. By any chance have you already discontinue her oxygen rx.

## 2020-02-06 DIAGNOSIS — D485 Neoplasm of uncertain behavior of skin: Secondary | ICD-10-CM | POA: Diagnosis not present

## 2020-02-06 DIAGNOSIS — D045 Carcinoma in situ of skin of trunk: Secondary | ICD-10-CM | POA: Diagnosis not present

## 2020-02-06 DIAGNOSIS — C44629 Squamous cell carcinoma of skin of left upper limb, including shoulder: Secondary | ICD-10-CM | POA: Diagnosis not present

## 2020-02-06 DIAGNOSIS — L570 Actinic keratosis: Secondary | ICD-10-CM | POA: Diagnosis not present

## 2020-02-06 DIAGNOSIS — Z85828 Personal history of other malignant neoplasm of skin: Secondary | ICD-10-CM | POA: Diagnosis not present

## 2020-02-18 ENCOUNTER — Telehealth: Payer: Self-pay

## 2020-02-18 NOTE — Telephone Encounter (Signed)
Sorry might to say ringing

## 2020-02-18 NOTE — Telephone Encounter (Signed)
Hearing ears???

## 2020-02-18 NOTE — Telephone Encounter (Signed)
Okay as Heidi Adams to send orders to discontinue

## 2020-02-18 NOTE — Telephone Encounter (Signed)
Pt called to inform us that she does not want to use her oxygen anymore due to hearing ears.

## 2020-02-19 NOTE — Telephone Encounter (Signed)
Send bev a message and will do it today

## 2020-02-24 ENCOUNTER — Ambulatory Visit: Payer: PPO | Admitting: Podiatry

## 2020-02-24 ENCOUNTER — Other Ambulatory Visit: Payer: Self-pay

## 2020-02-24 DIAGNOSIS — M7671 Peroneal tendinitis, right leg: Secondary | ICD-10-CM

## 2020-02-24 DIAGNOSIS — M722 Plantar fascial fibromatosis: Secondary | ICD-10-CM | POA: Diagnosis not present

## 2020-02-24 NOTE — Patient Instructions (Addendum)
Plantar Fasciitis (Heel Spur Syndrome) with Rehab The plantar fascia is a fibrous, ligament-like, soft-tissue structure that spans the bottom of the foot. Plantar fasciitis is a condition that causes pain in the foot due to inflammation of the tissue. SYMPTOMS   Pain and tenderness on the underneath side of the foot.  Pain that worsens with standing or walking. CAUSES  Plantar fasciitis is caused by irritation and injury to the plantar fascia on the underneath side of the foot. Common mechanisms of injury include:  Direct trauma to bottom of the foot.  Damage to a small nerve that runs under the foot where the main fascia attaches to the heel bone.  Stress placed on the plantar fascia due to bone spurs. RISK INCREASES WITH:   Activities that place stress on the plantar fascia (running, jumping, pivoting, or cutting).  Poor strength and flexibility.  Improperly fitted shoes.  Tight calf muscles.  Flat feet.  Failure to warm-up properly before activity.  Obesity. PREVENTION  Warm up and stretch properly before activity.  Allow for adequate recovery between workouts.  Maintain physical fitness:  Strength, flexibility, and endurance.  Cardiovascular fitness.  Maintain a health body weight.  Avoid stress on the plantar fascia.  Wear properly fitted shoes, including arch supports for individuals who have flat feet.  PROGNOSIS  If treated properly, then the symptoms of plantar fasciitis usually resolve without surgery. However, occasionally surgery is necessary.  RELATED COMPLICATIONS   Recurrent symptoms that may result in a chronic condition.  Problems of the lower back that are caused by compensating for the injury, such as limping.  Pain or weakness of the foot during push-off following surgery.  Chronic inflammation, scarring, and partial or complete fascia tear, occurring more often from repeated injections.  TREATMENT  Treatment initially involves the  use of ice and medication to help reduce pain and inflammation. The use of strengthening and stretching exercises may help reduce pain with activity, especially stretches of the Achilles tendon. These exercises may be performed at home or with a therapist. Your caregiver may recommend that you use heel cups of arch supports to help reduce stress on the plantar fascia. Occasionally, corticosteroid injections are given to reduce inflammation. If symptoms persist for greater than 6 months despite non-surgical (conservative), then surgery may be recommended.   MEDICATION   If pain medication is necessary, then nonsteroidal anti-inflammatory medications, such as aspirin and ibuprofen, or other minor pain relievers, such as acetaminophen, are often recommended.  Do not take pain medication within 7 days before surgery.  Prescription pain relievers may be given if deemed necessary by your caregiver. Use only as directed and only as much as you need.  Corticosteroid injections may be given by your caregiver. These injections should be reserved for the most serious cases, because they may only be given a certain number of times.  HEAT AND COLD  Cold treatment (icing) relieves pain and reduces inflammation. Cold treatment should be applied for 10 to 15 minutes every 2 to 3 hours for inflammation and pain and immediately after any activity that aggravates your symptoms. Use ice packs or massage the area with a piece of ice (ice massage).  Heat treatment may be used prior to performing the stretching and strengthening activities prescribed by your caregiver, physical therapist, or athletic trainer. Use a heat pack or soak the injury in warm water.  SEEK IMMEDIATE MEDICAL CARE IF:  Treatment seems to offer no benefit, or the condition worsens.  Any medications   produce adverse side effects.  EXERCISES- RANGE OF MOTION (ROM) AND STRETCHING EXERCISES - Plantar Fasciitis (Heel Spur Syndrome) These exercises  may help you when beginning to rehabilitate your injury. Your symptoms may resolve with or without further involvement from your physician, physical therapist or athletic trainer. While completing these exercises, remember:   Restoring tissue flexibility helps normal motion to return to the joints. This allows healthier, less painful movement and activity.  An effective stretch should be held for at least 30 seconds.  A stretch should never be painful. You should only feel a gentle lengthening or release in the stretched tissue.  RANGE OF MOTION - Toe Extension, Flexion  Sit with your right / left leg crossed over your opposite knee.  Grasp your toes and gently pull them back toward the top of your foot. You should feel a stretch on the bottom of your toes and/or foot.  Hold this stretch for 10 seconds.  Now, gently pull your toes toward the bottom of your foot. You should feel a stretch on the top of your toes and or foot.  Hold this stretch for 10 seconds. Repeat  times. Complete this stretch 3 times per day.   RANGE OF MOTION - Ankle Dorsiflexion, Active Assisted  Remove shoes and sit on a chair that is preferably not on a carpeted surface.  Place right / left foot under knee. Extend your opposite leg for support.  Keeping your heel down, slide your right / left foot back toward the chair until you feel a stretch at your ankle or calf. If you do not feel a stretch, slide your bottom forward to the edge of the chair, while still keeping your heel down.  Hold this stretch for 10 seconds. Repeat 3 times. Complete this stretch 2 times per day.   STRETCH  Gastroc, Standing  Place hands on wall.  Extend right / left leg, keeping the front knee somewhat bent.  Slightly point your toes inward on your back foot.  Keeping your right / left heel on the floor and your knee straight, shift your weight toward the wall, not allowing your back to arch.  You should feel a gentle stretch  in the right / left calf. Hold this position for 10 seconds. Repeat 3 times. Complete this stretch 2 times per day.  STRETCH  Soleus, Standing  Place hands on wall.  Extend right / left leg, keeping the other knee somewhat bent.  Slightly point your toes inward on your back foot.  Keep your right / left heel on the floor, bend your back knee, and slightly shift your weight over the back leg so that you feel a gentle stretch deep in your back calf.  Hold this position for 10 seconds. Repeat 3 times. Complete this stretch 2 times per day.  STRETCH  Gastrocsoleus, Standing  Note: This exercise can place a lot of stress on your foot and ankle. Please complete this exercise only if specifically instructed by your caregiver.   Place the ball of your right / left foot on a step, keeping your other foot firmly on the same step.  Hold on to the wall or a rail for balance.  Slowly lift your other foot, allowing your body weight to press your heel down over the edge of the step.  You should feel a stretch in your right / left calf.  Hold this position for 10 seconds.  Repeat this exercise with a slight bend in your right /   left knee. Repeat 3 times. Complete this stretch 2 times per day.   STRENGTHENING EXERCISES - Plantar Fasciitis (Heel Spur Syndrome)  These exercises may help you when beginning to rehabilitate your injury. They may resolve your symptoms with or without further involvement from your physician, physical therapist or athletic trainer. While completing these exercises, remember:   Muscles can gain both the endurance and the strength needed for everyday activities through controlled exercises.  Complete these exercises as instructed by your physician, physical therapist or athletic trainer. Progress the resistance and repetitions only as guided.  STRENGTH - Towel Curls  Sit in a chair positioned on a non-carpeted surface.  Place your foot on a towel, keeping your heel  on the floor.  Pull the towel toward your heel by only curling your toes. Keep your heel on the floor. Repeat 3 times. Complete this exercise 2 times per day.  STRENGTH - Ankle Inversion  Secure one end of a rubber exercise band/tubing to a fixed object (table, pole). Loop the other end around your foot just before your toes.  Place your fists between your knees. This will focus your strengthening at your ankle.  Slowly, pull your big toe up and in, making sure the band/tubing is positioned to resist the entire motion.  Hold this position for 10 seconds.  Have your muscles resist the band/tubing as it slowly pulls your foot back to the starting position. Repeat 3 times. Complete this exercises 2 times per day.  Document Released: 07/11/2005 Document Revised: 10/03/2011 Document Reviewed: 10/23/2008 [facility] Patient Information 2014 [place], [place].    Peroneal Tendinopathy Rehab Ask your health care provider which exercises are safe for you. Do exercises exactly as told by your health care provider and adjust them as directed. It is normal to feel mild stretching, pulling, tightness, or discomfort as you do these exercises. Stop right away if you feel sudden pain or your pain gets worse. Do not begin these exercises until told by your health care provider. Stretching and range-of-motion exercises These exercises warm up your muscles and joints and improve the movement and flexibility of your ankle. These exercises also help to relieve pain and stiffness. Gastroc and soleus stretch, standing  This is an exercise in which you stand on a step and use your body weight to stretch your calf muscles. To do this exercise: 1. Stand on the edge of a step on the ball of your left / right foot. The ball of your foot is on the walking surface, right under your toes. 2. Keep your other foot firmly on the same step. 3. Hold on to the wall, a railing, or a chair for balance. 4. Slowly lift your other  foot, allowing your body weight to press your left / right heel down over the edge of the step. You should feel a stretch in your left / right calf (gastrocnemius and soleus). 5. Hold this position for 15 seconds. 6. Return both feet to the step. 7. Repeat this exercise with a slight bend in your left / right knee. Repeat 5 times with your left / right knee straight and 5 times with your left / right knee bent. Complete this exercise 2 times a day. Strengthening exercises These exercises build strength and endurance in your foot and ankle. Endurance is the ability to use your muscles for a long time, even after they get tired. Ankle dorsiflexion with band   1. Secure a rubber exercise band or tube to an object,  such as a table leg, that will not move when the band is pulled. 2. Secure the other end of the band around your left / right foot. 3. Sit on the floor, facing the object with your left / right leg extended. The band or tube should be slightly tense when your foot is relaxed. 4. Slowly flex your left / right ankle and toes to bring your foot toward you (dorsiflexion). 5. Hold this position for 15 seconds. 6. Let the band or tube slowly pull your foot back to the starting position. Repeat 5 times. Complete this exercise 2 times a day. Ankle eversion 1. Sit on the floor with your legs straight out in front of you. 2. Loop a rubber exercise band or tube around the ball of your left / right foot. The ball of your foot is on the walking surface, right under your toes. 3. Hold the ends of the band in your hands, or secure the band to a stable object. The band or tube should be slightly tense when your foot is relaxed. 4. Slowly push your foot outward, away from your other leg (eversion). 5. Hold this position for 15 seconds. 6. Slowly return your foot to the starting position. Repeat 5 times. Complete this exercise 2 times a day. Plantar flexion, standing  This exercise is sometimes  called standing heel raise. 1. Stand with your feet shoulder-width apart. 2. Place your hands on a wall or table to steady yourself as needed, but try not to use it for support. 3. Keep your weight spread evenly over the width of your feet while you slowly rise up on your toes (plantar flexion). If told by your health care provider: ? Shift your weight toward your left / right leg until you feel challenged. ? Stand on your left / right leg only. 4. Hold this position for 15 seconds. Repeat 2 times. Complete this exercise 2 times a day. Single leg stand 1. Without shoes, stand near a railing or in a doorway. You may hold on to the railing or door frame as needed. 2. Stand on your left / right foot. Keep your big toe down on the floor and try to keep your arch lifted. ? Do not roll to the outside of your foot. ? If this exercise is too easy, you can try it with your eyes closed or while standing on a pillow. 3. Hold this position for 15 seconds. Repeat 5 times. Complete this exercise 2 times a day. This information is not intended to replace advice given to you by your health care provider. Make sure you discuss any questions you have with your health care provider. Document Revised: 10/30/2018 Document Reviewed: 10/30/2018 Elsevier Patient Education  [place].  Peroneal Tendinopathy  Peroneal tendinopathy is irritation of the tendons that pass behind your ankle (peroneal tendons). These tendons attach muscles in your foot to a bone on the side of your foot and underneath the arch of your foot. This condition can cause your peroneal tendons to get bigger and swell. What are the causes? This condition may be caused by:  Putting stress on your ankle over and over again (overuse injury).  A sudden injury that puts stress on your tendons, such as an ankle sprain. What increases the risk? You are more likely to develop this condition if you:  Have high arches.  Play sports that  involve putting stress on the ankle over and over again. These sports include: ? Running. ?  Dancing. ? Soccer. ? Basketball. What are the signs or symptoms? Symptoms of this condition can start suddenly or develop gradually. Symptoms of this condition include:  Pain in the back of the ankle, on the side of the foot, or in the arch of the foot.  Pain that gets worse with activity and better with rest.  Swelling.  Warmth.  Weakness in your foot or ankle. How is this diagnosed? This condition may be diagnosed based on:  Your symptoms.  Your medical history.  A physical exam. During the exam, your health care provider may move your foot and ankle and test the strength of your leg muscles.  Imaging tests, such as: ? X-rays or a CT scan to check for bone injury. ? MRI or ultrasound to check for muscle or tendon injury. How is this treated? This condition may be treated by:  Keeping your body weight off your ankle for several days.  Returning to full activity gradually.  Putting ice on your ankle to reduce swelling.  Taking NSAIDs, such as ibuprofen.  Having medicine injected into your tendon to reduce swelling.  Wearing a removable boot or brace for ankle support.  Doing range-of-motion exercises and strengthening exercises (physical therapy) when pain and swelling improve. If the condition does not improve with treatment, or if a tendon or muscle is damaged, surgery may be needed. Follow these instructions at home: If you have a boot or brace:  Wear the boot or brace as told by your health care provider. Remove it only as told by your health care provider.  Loosen the boot or brace if your toes tingle, become numb, or turn cold and blue.  Keep the boot or brace clean.  If the boot or brace is not waterproof: ? Do not let it get wet. ? Cover it with a watertight covering when you take a bath or shower. Managing pain, stiffness, and swelling   If directed, put  ice on the injured area. ? If you have a removable boot or brace, remove it as told by your health care provider. ? Put ice in a plastic bag. ? Place a towel between your skin and the bag. ? Leave the ice on for 20 minutes, 2-3 times a day.  Move your toes often to reduce stiffness and swelling.  Raise (elevate) your ankle above the level of your heart while you are sitting or lying down. Activity  Do not do activities that make pain or swelling worse.  Do exercises as told by your health care provider.  Return to your normal activities as told by your health care provider. Ask your health care provider what activities are safe for you.  Ask your health care provider when it is safe to drive if you have a boot or brace on your foot. General instructions  Take over-the-counter and prescription medicines only as told by your health care provider.  Do not use any products that contain nicotine or tobacco, such as cigarettes, e-cigarettes, and chewing tobacco. These can delay healing. If you need help quitting, ask your health care provider.  Keep all follow-up visits as told by your health care provider. This is important. How is this prevented?  Wear supportive footwear that is appropriate for your athletic activity.  Avoid athletic activities that cause swelling or pain in your ankle or foot.  See your health care provider if you have pain or swelling that does not improve after a few days of rest.  Stop training  if you develop pain or swelling.  If you start a new athletic activity, start gradually to build up your strength, endurance, and flexibility.  Warm up and stretch before being active.  Cool down and stretch after being active. Contact a health care provider if:  Your symptoms get worse.  Your symptoms do not improve in 2-4 weeks.  You develop new, unexplained symptoms. Summary  Peroneal tendinopathy is irritation of the tendons that pass behind your  ankle.  This condition is caused by overuse or sudden injury to the peroneal tendon.  Symptoms include pain, swelling, warmth, and weakness in your foot or ankle.  This condition is treated with rest, ice, medicines, physical therapy, and surgery if needed. This information is not intended to replace advice given to you by your health care provider. Make sure you discuss any questions you have with your health care provider. Document Revised: 11/01/2018 Document Reviewed: 08/20/2018 Elsevier Patient Education  [place].

## 2020-02-24 NOTE — Progress Notes (Signed)
  Subjective:  Patient ID: Heidi Adams, female    DOB: 1936-03-25,  MRN: [id number]  Chief Complaint  Patient presents with  . Foot Pain    Rt bottom heel apin x 2 mo; 10/10 occasional shar pains -no injury -w/ swelling -worse with walking tx; lidocaine pain realive, tylenol, heat     83 y.o. female presents with the above complaint. History confirmed with patient.  She requests an injection for the heel pain today.  These have been very helpful for her in the past.  She also is pain on the outside of the foot the fifth metatarsal base.  Objective:  Physical Exam: warm, good capillary refill, no trophic changes or ulcerative lesions, normal DP and PT pulses and normal sensory exam. Right Foot: Pain on palpation to the peroneus brevis especially the fifth metatarsal base, this is painful with resisted eversion.  She also pain at the medial calcaneal tubercle plantarly.  No posterior heel pain  Assessment:   1. Plantar fasciitis   2. Peroneal tendinitis, right      Plan:  Patient was evaluated and treated and all questions answered.  -Discussed with her etiology and treatment options for the condition in detail.  I recommended we continue her stretching for the heel pain wearing supportive shoes and inserts an injection today. -She also has peroneal tendinitis, I recommended stretching exercises and that injection this area could increase risk of rupture.  I advised that she try an anti-inflammatory gel such as Voltaren gel.  I offered to immobilize her in a Tri-Lock ankle brace to provide support, however she says that she thinks it may cause her to fall.  -After sterile prep with povidone-iodine solution and alcohol, the right heel was injected with 0.5cc 2% xylocaine plain, 0.5cc 0.5% marcaine plain, 5mg  triamcinolone acetonide, and 2mg  dexamethasone was injected along the medial plantar fascia at the insertion on the plantar calcaneus. The patient tolerated the procedure well without  complication.    Return in about 6 weeks (around 04/06/2020) for re-check peroneal tendinitis.

## 2020-02-26 DIAGNOSIS — I480 Paroxysmal atrial fibrillation: Secondary | ICD-10-CM | POA: Diagnosis not present

## 2020-02-26 DIAGNOSIS — I10 Essential (primary) hypertension: Secondary | ICD-10-CM | POA: Diagnosis not present

## 2020-02-26 DIAGNOSIS — I5032 Chronic diastolic (congestive) heart failure: Secondary | ICD-10-CM | POA: Diagnosis not present

## 2020-03-10 ENCOUNTER — Ambulatory Visit: Payer: PPO

## 2020-03-10 ENCOUNTER — Other Ambulatory Visit: Payer: Self-pay

## 2020-03-10 DIAGNOSIS — I5032 Chronic diastolic (congestive) heart failure: Secondary | ICD-10-CM | POA: Diagnosis not present

## 2020-03-11 ENCOUNTER — Telehealth: Payer: Self-pay

## 2020-03-16 ENCOUNTER — Telehealth: Payer: Self-pay

## 2020-03-16 DIAGNOSIS — I4891 Unspecified atrial fibrillation: Secondary | ICD-10-CM | POA: Diagnosis not present

## 2020-03-16 DIAGNOSIS — Z20828 Contact with and (suspected) exposure to other viral communicable diseases: Secondary | ICD-10-CM | POA: Diagnosis not present

## 2020-03-16 DIAGNOSIS — R509 Fever, unspecified: Secondary | ICD-10-CM | POA: Diagnosis not present

## 2020-03-16 NOTE — Telephone Encounter (Signed)
Pt called to inform us that she had a chill, pt mention she thought she might have Covid, Pt went to an Urgent care and was tested and was also told that her oxygen was 94% and that needed to go to the ER. Pt does not want to go to the ER, but is asking can she take her Lasix? Please advise

## 2020-03-16 NOTE — Telephone Encounter (Signed)
Called pt per Dr. [name], she is able to take a lasix and due to her fever pt would need to wait until her covid test comes back to proceed with protocol

## 2020-03-17 NOTE — Telephone Encounter (Signed)
Sure she can. Glad to know this. JG

## 2020-03-17 NOTE — Telephone Encounter (Signed)
Pt called and mention her Covid test was negative, and would like to see if she can take a Nadolol 25mg . Please advise

## 2020-03-18 NOTE — Telephone Encounter (Signed)
Called pt to inform her about the message above pt understood

## 2020-03-19 ENCOUNTER — Telehealth: Payer: Self-pay

## 2020-03-19 ENCOUNTER — Ambulatory Visit: Payer: PPO | Admitting: Cardiology

## 2020-03-19 ENCOUNTER — Encounter: Payer: Self-pay | Admitting: Cardiology

## 2020-03-19 ENCOUNTER — Other Ambulatory Visit: Payer: Self-pay

## 2020-03-19 ENCOUNTER — Other Ambulatory Visit (HOSPITAL_COMMUNITY): Payer: Self-pay

## 2020-03-19 VITALS — BP 151/99 | Resp 16 | Ht 64.0 in | Wt 133.0 lb

## 2020-03-19 DIAGNOSIS — I10 Essential (primary) hypertension: Secondary | ICD-10-CM

## 2020-03-19 DIAGNOSIS — R0602 Shortness of breath: Secondary | ICD-10-CM

## 2020-03-19 DIAGNOSIS — I484 Atypical atrial flutter: Secondary | ICD-10-CM

## 2020-03-19 DIAGNOSIS — I5023 Acute on chronic systolic (congestive) heart failure: Secondary | ICD-10-CM | POA: Diagnosis not present

## 2020-03-19 DIAGNOSIS — I428 Other cardiomyopathies: Secondary | ICD-10-CM | POA: Diagnosis not present

## 2020-03-19 MED ORDER — POTASSIUM CHLORIDE CRYS ER 10 MEQ PO TBCR: 10.0000 meq | EXTENDED_RELEASE_TABLET | Freq: Two times a day (BID) | ORAL | 1 refills | Status: DC

## 2020-03-19 MED ORDER — METOPROLOL TARTRATE 25 MG PO TABS: 25.0000 mg | ORAL_TABLET | Freq: Two times a day (BID) | ORAL | 2 refills | Status: DC

## 2020-03-19 MED ORDER — FUROSEMIDE 40 MG PO TABS: 40.0000 mg | ORAL_TABLET | Freq: Two times a day (BID) | ORAL | 2 refills | Status: DC

## 2020-03-19 NOTE — Progress Notes (Signed)
Primary Physician/Referring:  [name], MD  Patient ID: Heidi Adams, female    DOB: March 01, 1936, 84 y.o.   MRN: [id number]  Chief Complaint  Patient presents with  . Chest Pain  . Shortness of Breath   HPI:    Heidi Adams  is a 84 y.o. female  with bilateral hip replacement and knee replacement and left breast lumpectomy in 2018 without chemotherapy or radiation therapy for breast cancer and also remote hysterectomy due to uterine cancer in 1991, supine hypertension and orthostatic hypotension. She has chronic dizziness on standing. Has not been able to tolerate any BP medications due to severe dizzy spells.   There is documented A. fib on ER visit in 2017 at [facility], has not had known recurrence over the last 4 years. She has been using nocturnal oxygen on a regular basis.   Patient presents today for acute visit with complaints of shortness or breath and chest pain. Shortness of breath started last night while she was walking to the restroom and has worsened since then. Left-sided chest pain began this morning, has been constant, and is associated with light-headedness. The pain does not radiated and is not associated with nausea or diaphoresis. Worse on taking deep breath. No fever, no chills. Few days ago, due to cough and fever was evaluated at an urgent care and is Covid -ve. Patient has also had some dizziness while walking starting last night. Reports chronic stable leg swelling.   Past Medical History:  Diagnosis Date  . Allergy   . Anemia    hx of anemia  . Arthritis   . Breast cancer ([place])    Left breast  . Cancer ([place])    skin ca nose  . Chronic combined systolic and diastolic CHF (congestive heart failure) ([place]) 09/22/2018  . Chronic combined systolic and diastolic CHF (congestive heart failure) ([place]) 09/22/2018  . Chronic combined systolic and diastolic CHF (congestive heart failure) ([place]) 09/22/2018  . Dysrhythmia    went into Atrial Fibrillation after last  surgery, HAS PALPITATIONS  . History of skin cancer   . Hypertension   . Hypothyroidism   . PVC (premature ventricular contraction)   . Thyroiditis 1973   Past Surgical History:  Procedure Laterality Date  . ABDOMINAL HYSTERECTOMY  89   TAH BSO  . APPENDECTOMY    . BREAST LUMPECTOMY Left 05/30/2017  . BREAST LUMPECTOMY WITH RADIOACTIVE SEED LOCALIZATION Left 05/30/2017   Procedure: LEFT BREAST LUMPECTOMY WITH RADIOACTIVE SEED LOCALIZATION;  Surgeon: Excell Seltzer, MD;  Location: [place];  Service: General;  Laterality: Left;  . CATARACT EXTRACTION  09/10/2018   Right eye  . CHOLECYSTECTOMY     dr Georgia Lopes  . ELBOW SURGERY    . EYE SURGERY     "on my eyelids" years ago  . KNEE ARTHROSCOPY     left  . THYROIDECTOMY  1973  . TONSILLECTOMY    . TOTAL HIP ARTHROPLASTY Right 05/10/2016   Procedure: TOTAL HIP ARTHROPLASTY ANTERIOR APPROACH;  Surgeon: Paralee Cancel, MD;  Location: [facility];  Service: Orthopedics;  Laterality: Right;  . TOTAL HIP ARTHROPLASTY Left 01/10/2017   Procedure: LEFT TOTAL HIP ARTHROPLASTY ANTERIOR APPROACH;  Surgeon: Paralee Cancel, MD;  Location: [facility];  Service: Orthopedics;  Laterality: Left;  . TOTAL KNEE ARTHROPLASTY Left 09/28/2015   Procedure: LEFT TOTAL KNEE ARTHROPLASTY;  Surgeon: Paralee Cancel, MD;  Location: [facility];  Service: Orthopedics;  Laterality: Left;  . TOTAL KNEE ARTHROPLASTY Right 08/09/2016  Procedure: RIGHT TOTAL KNEE ARTHROPLASTY;  Surgeon: Paralee Cancel, MD;  Location: [facility];  Service: Orthopedics;  Laterality: Right;  Adductor Block  . Total Left hip arthroplasty     01/10/17 Dr. Alvan Dame   Family History  Problem Relation Age of Onset  . Diabetes Father   . Hypertension Father   . Stroke Father   . Breast cancer Cousin   . Pneumonia Sister     Social History   Tobacco Use  . Smoking status: Former Smoker    Packs/day: 1.00    Years: 20.00    Pack years: 20.00    Types: Cigarettes    Quit date: 07/26/1987    Years  since quitting: 32.6  . Smokeless tobacco: Never Used  Substance Use Topics  . Alcohol use: No    Alcohol/week: 0.0 standard drinks   Marital Status: Married  ROS  Review of Systems  Constitutional: Positive for malaise/fatigue. Negative for diaphoresis and weight gain.  Cardiovascular: Positive for chest pain, dyspnea on exertion, leg swelling (intermittent, improved with support stockings) and orthopnea. Negative for palpitations and syncope.  Respiratory: Positive for shortness of breath. Negative for cough.   Musculoskeletal: Positive for arthritis and joint pain. Negative for joint swelling.  Gastrointestinal: Negative for nausea and vomiting.  Neurological: Positive for light-headedness.   Objective  Blood pressure (!) 151/99, resp. rate 16, height 5' 4"  (1.626 m), weight 133 lb (60.3 kg), last menstrual period 07/25/1988, SpO2 95 %.  Vitals with BMI 03/22/2020 03/22/2020 03/22/2020  Height - - -  Weight - - -  BMI - - -  Systolic 177 939 030  Diastolic 68 88 68  Pulse 66 - 96     Physical Exam Constitutional:      General: She is not in acute distress.    Appearance: She is ill-appearing.     Comments: Moderately build, frail  Cardiovascular:     Rate and Rhythm: Tachycardia present. Rhythm irregular.     Pulses: Intact distal pulses.          Radial pulses are 2+ on the right side and 2+ on the left side.       Dorsalis pedis pulses are 1+ on the right side and 1+ on the left side.       Posterior tibial pulses are 1+ on the right side and 1+ on the left side.     Heart sounds: S1 normal and S2 normal. No murmur heard.  No gallop.      Comments: 1-2+ pitting edema bilateral legs. JVD noted.   Pulmonary:     Effort: No accessory muscle usage or respiratory distress.     Breath sounds: Examination of the right-lower field reveals rhonchi. Examination of the left-lower field reveals rhonchi. Rhonchi present.     Comments: Labored breathing without hypoxia or acute  respiratory distress.   Laboratory examination:   Recent Labs    03/20/20 1043 03/21/20 2116 03/21/20 2330  NA 136 134* 135  K 3.8 3.8 3.4*  CL 97 98 97*  CO2 23 22  --   GLUCOSE 108* 126* 122*  BUN 24 31* 28*  CREATININE 0.96 1.10* 1.10*  CALCIUM 8.8 8.4*  --   GFRNONAA 56* 47*  --   GFRAA 64 55*  --    estimated creatinine clearance is 34.6 mL/min (A) (by C-G formula based on SCr of 1.1 mg/dL (H)).  CMP Latest Ref Rng & Units 03/21/2020 03/21/2020 03/20/2020  Glucose 70 - 99 mg/dL  122(H) 126(H) 108(H)  BUN 8 - 23 mg/dL 28(H) 31(H) 24  Creatinine 0.44 - 1.00 mg/dL 1.10(H) 1.10(H) 0.96  Sodium 135 - 145 mmol/L 135 134(L) 136  Potassium 3.5 - 5.1 mmol/L 3.4(L) 3.8 3.8  Chloride 98 - 111 mmol/L 97(L) 98 97  CO2 22 - 32 mmol/L - 22 23  Calcium 8.9 - 10.3 mg/dL - 8.4(L) 8.8  Total Protein 6.5 - 8.1 g/dL - 5.8(L) 5.9(L)  Total Bilirubin 0.3 - 1.2 mg/dL - 0.4 0.5  Alkaline Phos 38 - 126 U/L - 80 89  AST 15 - 41 U/L - 37 38  ALT 0 - 44 U/L - 33 22   CBC Latest Ref Rng & Units 03/21/2020 03/21/2020 03/20/2020  WBC 4.0 - 10.5 K/uL - 19.5(H) 12.7(H)  Hemoglobin 12.0 - 15.0 g/dL 8.8(L) 9.1(L) 8.7(L)  Hematocrit 36 - 46 % 26.0(L) 30.5(L) 28.7(L)  Platelets 150 - 400 K/uL - 427(H) 399   Lipid Panel     Component Value Date/Time   CHOL 142 07/31/2018 0655   TRIG 72 07/31/2018 0655   HDL 45 07/31/2018 0655   CHOLHDL 3.2 07/31/2018 0655   VLDL 14 07/31/2018 0655   LDLCALC 83 07/31/2018 0655   HEMOGLOBIN A1C Lab Results  Component Value Date   HGBA1C 5.0 07/31/2018   MPG 96.8 07/31/2018   TSH Recent Labs    03/21/20 2329  TSH 5.095*   BNP (last 3 results) Recent Labs    03/20/20 1043 03/21/20 2116  BNP 874.3* 1,091.1*    External labs:   Lipid Panel completed 04/09/2019 HDL 38 MG/DL 04/09/2019 LDL 80.000 mg 04/09/2019 Cholesterol, total 143.000 m 04/09/2019 Triglycerides 127.000 04/09/2019  A1C 5.000 07/31/2018  Glucose Random 110.000 m 11/04/2019 MicroAlbumin  Urine 9.000 04/25/2019 MicroAlbumin/Creat 16.8 MG/ 04/25/2019 BUN 18.000 mg 11/04/2019 Creatinine, Serum 0.700 mg/ 11/04/2019  TSH 2.620 micr 11/04/2019  Medications and allergies   Allergies  Allergen Reactions  . Codeine Nausea Only  . Epinephrine     Tachcardyia, chest pains tremors  . Tenormin [Atenolol] Rash    Current Outpatient Medications  Medication Instructions  . acetaminophen (TYLENOL) 1,000 mg, Oral, 3 times daily PRN  . apixaban (ELIQUIS) 5 mg, Oral, 2 times daily  . furosemide (LASIX) 40 mg, Oral, 2 times daily  . levothyroxine (SYNTHROID) 112-168 mcg, Oral, Daily before breakfast, Take 1 tablet (112 mcg) on Mondays, Tuesdays, Wednesdays, Thursdays, Fridays & Saturdays.Take 1.5 tablet (168 mcg) on Sundays  . lidocaine (LMX) 4 % cream 1 application, Topical, 4 times daily PRN  . metoprolol tartrate (LOPRESSOR) 25 mg, Oral, 2 times daily  . pantoprazole (PROTONIX) 40 mg, Oral, Daily PRN  . potassium chloride (KLOR-CON) 10 MEQ tablet 10 mEq, Oral, 2 times daily   Radiology:   DG Chest Port 1 View  Result Date: 03/21/2020 CLINICAL DATA:  84 year old female with questionable sepsis. EXAM: PORTABLE CHEST 1 VIEW COMPARISON:  Chest radiograph dated 06/21/2017. FINDINGS: Mild chronic interstitial coarsening. No focal consolidation, pleural effusion, or pneumothorax. Stable mild cardiomegaly. Atherosclerotic calcification of the aorta. No acute osseous pathology. IMPRESSION: No active disease. Electronically Signed   By: Anner Crete M.D.   On: 03/21/2020 21:45    Cardiac Studies:   Echocardiogram 03/21/2018: Left ventricle cavity is normal in size. Moderate concentric hypertrophy of the left ventricle. Moderate decrease in global wall motion. Doppler evidence of grade II (pseudonormal) diastolic dysfunction, elevated LAP. Calculated EF 30%. Left atrial cavity is normal in size. Aneurysmal interatrial septum without PFO. Moderate (Grade II)  aortic regurgitation. Mild  mitral valve leaflet calcification. Study is technically inadequate to assess for mitral stenosis.  Moderate (Grade II) mitral regurgitation. Mild tricuspid regurgitation. Estimated pulmonary artery systolic pressure 24  mmHg. Mild pulmonic regurgitation. Insignificant pericardial effusion. IVC is dilated with respiratory variation. Estimated RA pressure 8 mmHg.  Lexiscan Myoview stress test  04/23/2018: 1. The resting electrocardiogram demonstrated normal sinus rhythm, normal resting conduction and no resting arrhythmias.  Stress EKG is non-diagnostic for ischemia as it a pharmacologic stress using Lexiscan. Occasional PAC and PVC. The patient developed significant symptoms which included Chest Pain, Stomach pain, Headache.  2. The overall quality of the study is good. There is no evidence of abnormal lung activity. Stress and rest SPECT images demonstrate homogeneous tracer distribution throughout the myocardium. Gated SPECT imaging reveals diffuse global hypokinesis. The left ventricular ejection fraction was markedly depressed at (26%).   3. This is a high risk study due to severe LV systolic dysfunction. Findings may represent non ischemic cardiomyopathy.  Echocardiogram 03/10/2020:  Left ventricle cavity is normal in size. Moderate concentric hypertrophy of the left ventricle. Severe global hypokinesis. LVEF 15-20%. Doppler evidence of grade II (pseudonormal) diastolic dysfunction, elevated LAP.  The aortic root is mildly dilated 3.9 cm.  Left atrial cavity is severely dilated at 65 cc/m2.  Trileaflet aortic valve. Moderate (Grade II) aortic regurgitation.  Moderate (Grade III) mitral regurgitation.  Mild tricuspid regurgitation. Estimated pulmonary artery systolic pressure 38 mmHg.  Mild pulmonic regurgitation.  Previous study on 07/30/2018, EF 40% (reported normal)    EKG  EKG 04/23/2020: Atypical atrial flutter with variable AV conduction, left axis deviation, left anterior fascicular  block.  Poor R wave progression, cannot exclude anteroseptal infarct old.  LVH with repolarization abnormality.    EKG 01/10/2020: Normal sinus rhythm with rate of 95 bpm, left axis deviation, left anterior fascicular block.  Poor R wave progression, cannot exclude anteroseptal infarct old.  No evidence of ischemia. No significant change from EKG 11/29/2019    09/24/2019: Normal sinus rhythm at 93 bpm with occasional PAC, left axis deviation, PRWP cannot exclude anterior infarct old. ST abnormality in lateral leads, cannot exclude ischemia   Assessment     ICD-10-CM   1. Acute on chronic systolic heart failure (HCC)  I50.23 furosemide (LASIX) 40 MG tablet    CBC    CMP14+EGFR    potassium chloride (KLOR-CON) 10 MEQ tablet    Brain natriuretic peptide    Magnesium  2. Shortness of breath  R06.02 EKG 12-Lead  3. Atypical atrial flutter (HCC)  I48.4 metoprolol tartrate (LOPRESSOR) 25 MG tablet  4. Nonischemic cardiomyopathy (HCC)  I42.8   5. Essential hypertension  I10 CMP14+EGFR    metoprolol tartrate (LOPRESSOR) 25 MG tablet    Meds ordered this encounter  Medications  . furosemide (LASIX) 40 MG tablet    Sig: Take 1 tablet (40 mg total) by mouth 2 (two) times daily.    Dispense:  60 tablet    Refill:  2  . potassium chloride (KLOR-CON) 10 MEQ tablet    Sig: Take 1 tablet (10 mEq total) by mouth 2 (two) times daily.    Dispense:  60 tablet    Refill:  1  . metoprolol tartrate (LOPRESSOR) 25 MG tablet    Sig: Take 1 tablet (25 mg total) by mouth 2 (two) times daily.    Dispense:  60 tablet    Refill:  2    Medications Discontinued During This Encounter  Medication Reason  .  predniSONE (DELTASONE) 5 MG tablet Completed Course  . furosemide (LASIX) 40 MG tablet     Recommendations:   Heidi Adams  is a 84 y.o. female  with bilateral hip replacement and knee replacement and left breast lumpectomy in 2018 without chemotherapy or radiation therapy for breast cancer and also remote  hysterectomy due to uterine cancer in 1991, supine hypertension and orthostatic hypotension, paroxysmal atrial fibrillation on chronic Eliquis, chronic dizziness on standing. Has not been able to tolerate any BP medications due to severe dizzy spells.  She called me today to be seen on a urgent basis due to acute onset of shortness of breath this morning, sharp chest pain and not feeling well over the past 2 to 3 days and worse today.  She is in acute decompensated heart failure, will start her on furosemide twice a day for now instead of 1 tablet daily and she can always double up on the dosage if she has worsening leg edema.  She is presently in atypical atrial flutter, I will restart metoprolol tartrate 25 mg p.o. twice daily.  Suspect her decompensated failure could be related to atypical atrial flutter, I will set her up for direct-current cardioversion in 2 days.  I would like to see her back in the office in 2 to 3 weeks. This was a 40 minute encounter with evaluation of external records, management of acute decompensated CHF. Husband present. All questions answered.   Will obtain routine labs including BNP, CBC and CMP.   Adrian Prows, MD, [facility] 03/22/2020, 12:05 PM Office: [phone]

## 2020-03-20 ENCOUNTER — Other Ambulatory Visit (HOSPITAL_COMMUNITY)
Admission: RE | Admit: 2020-03-20 | Discharge: 2020-03-20 | Disposition: A | Discharge status: Home / Self Care | Payer: PPO | Source: Ambulatory Visit | Attending: Cardiology | Admitting: Cardiology

## 2020-03-20 DIAGNOSIS — I4891 Unspecified atrial fibrillation: Secondary | ICD-10-CM | POA: Diagnosis not present

## 2020-03-20 DIAGNOSIS — R21 Rash and other nonspecific skin eruption: Secondary | ICD-10-CM | POA: Diagnosis not present

## 2020-03-20 DIAGNOSIS — I10 Essential (primary) hypertension: Secondary | ICD-10-CM | POA: Diagnosis not present

## 2020-03-20 DIAGNOSIS — Z01812 Encounter for preprocedural laboratory examination: Secondary | ICD-10-CM | POA: Insufficient documentation

## 2020-03-20 DIAGNOSIS — I480 Paroxysmal atrial fibrillation: Secondary | ICD-10-CM | POA: Diagnosis not present

## 2020-03-20 DIAGNOSIS — Z20822 Contact with and (suspected) exposure to covid-19: Secondary | ICD-10-CM | POA: Insufficient documentation

## 2020-03-20 DIAGNOSIS — I517 Cardiomegaly: Secondary | ICD-10-CM | POA: Diagnosis not present

## 2020-03-20 DIAGNOSIS — A419 Sepsis, unspecified organism: Secondary | ICD-10-CM | POA: Diagnosis not present

## 2020-03-20 DIAGNOSIS — R748 Abnormal levels of other serum enzymes: Secondary | ICD-10-CM | POA: Diagnosis not present

## 2020-03-20 DIAGNOSIS — I5031 Acute diastolic (congestive) heart failure: Secondary | ICD-10-CM | POA: Diagnosis not present

## 2020-03-20 LAB — SARS CORONAVIRUS 2 (TAT 6-24 HRS): SARS Coronavirus 2: NEGATIVE

## 2020-03-21 ENCOUNTER — Encounter (HOSPITAL_COMMUNITY): Payer: Self-pay

## 2020-03-21 ENCOUNTER — Other Ambulatory Visit: Payer: Self-pay

## 2020-03-21 ENCOUNTER — Inpatient Hospital Stay (HOSPITAL_COMMUNITY)
Admission: EM | Admit: 2020-03-21 | Discharge: 2020-03-27 | DRG: 309 | Disposition: A | Discharge status: Home Health | Payer: PPO | Attending: Internal Medicine | Admitting: Internal Medicine

## 2020-03-21 ENCOUNTER — Emergency Department (HOSPITAL_COMMUNITY): Payer: PPO

## 2020-03-21 DIAGNOSIS — Z20822 Contact with and (suspected) exposure to covid-19: Secondary | ICD-10-CM | POA: Diagnosis present

## 2020-03-21 DIAGNOSIS — A419 Sepsis, unspecified organism: Secondary | ICD-10-CM

## 2020-03-21 DIAGNOSIS — R21 Rash and other nonspecific skin eruption: Secondary | ICD-10-CM | POA: Diagnosis not present

## 2020-03-21 DIAGNOSIS — I499 Cardiac arrhythmia, unspecified: Secondary | ICD-10-CM | POA: Diagnosis not present

## 2020-03-21 DIAGNOSIS — R778 Other specified abnormalities of plasma proteins: Secondary | ICD-10-CM

## 2020-03-21 DIAGNOSIS — R7881 Bacteremia: Secondary | ICD-10-CM | POA: Diagnosis present

## 2020-03-21 DIAGNOSIS — R748 Abnormal levels of other serum enzymes: Secondary | ICD-10-CM | POA: Diagnosis not present

## 2020-03-21 DIAGNOSIS — I480 Paroxysmal atrial fibrillation: Principal | ICD-10-CM | POA: Diagnosis present

## 2020-03-21 DIAGNOSIS — E039 Hypothyroidism, unspecified: Secondary | ICD-10-CM | POA: Diagnosis present

## 2020-03-21 DIAGNOSIS — R000 Tachycardia, unspecified: Secondary | ICD-10-CM | POA: Diagnosis not present

## 2020-03-21 DIAGNOSIS — Z96643 Presence of artificial hip joint, bilateral: Secondary | ICD-10-CM | POA: Diagnosis present

## 2020-03-21 DIAGNOSIS — Z90710 Acquired absence of both cervix and uterus: Secondary | ICD-10-CM

## 2020-03-21 DIAGNOSIS — I5023 Acute on chronic systolic (congestive) heart failure: Secondary | ICD-10-CM

## 2020-03-21 DIAGNOSIS — Z96653 Presence of artificial knee joint, bilateral: Secondary | ICD-10-CM | POA: Diagnosis present

## 2020-03-21 DIAGNOSIS — I5022 Chronic systolic (congestive) heart failure: Secondary | ICD-10-CM | POA: Diagnosis present

## 2020-03-21 DIAGNOSIS — I428 Other cardiomyopathies: Secondary | ICD-10-CM | POA: Diagnosis present

## 2020-03-21 DIAGNOSIS — Z743 Need for continuous supervision: Secondary | ICD-10-CM | POA: Diagnosis not present

## 2020-03-21 DIAGNOSIS — I4891 Unspecified atrial fibrillation: Secondary | ICD-10-CM | POA: Diagnosis not present

## 2020-03-21 DIAGNOSIS — Z8679 Personal history of other diseases of the circulatory system: Secondary | ICD-10-CM

## 2020-03-21 DIAGNOSIS — I110 Hypertensive heart disease with heart failure: Secondary | ICD-10-CM | POA: Diagnosis present

## 2020-03-21 DIAGNOSIS — Z79899 Other long term (current) drug therapy: Secondary | ICD-10-CM

## 2020-03-21 DIAGNOSIS — Z8542 Personal history of malignant neoplasm of other parts of uterus: Secondary | ICD-10-CM

## 2020-03-21 DIAGNOSIS — C50212 Malignant neoplasm of upper-inner quadrant of left female breast: Secondary | ICD-10-CM

## 2020-03-21 DIAGNOSIS — I080 Rheumatic disorders of both mitral and aortic valves: Secondary | ICD-10-CM | POA: Diagnosis present

## 2020-03-21 DIAGNOSIS — E890 Postprocedural hypothyroidism: Secondary | ICD-10-CM | POA: Diagnosis present

## 2020-03-21 DIAGNOSIS — R0789 Other chest pain: Secondary | ICD-10-CM | POA: Diagnosis not present

## 2020-03-21 DIAGNOSIS — Z7901 Long term (current) use of anticoagulants: Secondary | ICD-10-CM

## 2020-03-21 DIAGNOSIS — I951 Orthostatic hypotension: Secondary | ICD-10-CM | POA: Diagnosis not present

## 2020-03-21 DIAGNOSIS — Z85828 Personal history of other malignant neoplasm of skin: Secondary | ICD-10-CM

## 2020-03-21 DIAGNOSIS — Z170 Estrogen receptor positive status [ER+]: Secondary | ICD-10-CM

## 2020-03-21 DIAGNOSIS — B9620 Unspecified Escherichia coli [E. coli] as the cause of diseases classified elsewhere: Secondary | ICD-10-CM | POA: Diagnosis present

## 2020-03-21 DIAGNOSIS — I471 Supraventricular tachycardia: Secondary | ICD-10-CM | POA: Diagnosis present

## 2020-03-21 DIAGNOSIS — E876 Hypokalemia: Secondary | ICD-10-CM | POA: Diagnosis present

## 2020-03-21 DIAGNOSIS — I517 Cardiomegaly: Secondary | ICD-10-CM | POA: Diagnosis not present

## 2020-03-21 DIAGNOSIS — R5381 Other malaise: Secondary | ICD-10-CM | POA: Diagnosis present

## 2020-03-21 DIAGNOSIS — R1314 Dysphagia, pharyngoesophageal phase: Secondary | ICD-10-CM | POA: Diagnosis present

## 2020-03-21 DIAGNOSIS — R079 Chest pain, unspecified: Secondary | ICD-10-CM | POA: Diagnosis not present

## 2020-03-21 DIAGNOSIS — I10 Essential (primary) hypertension: Secondary | ICD-10-CM | POA: Diagnosis present

## 2020-03-21 DIAGNOSIS — Z87891 Personal history of nicotine dependence: Secondary | ICD-10-CM

## 2020-03-21 DIAGNOSIS — N3000 Acute cystitis without hematuria: Secondary | ICD-10-CM | POA: Diagnosis present

## 2020-03-21 DIAGNOSIS — Z853 Personal history of malignant neoplasm of breast: Secondary | ICD-10-CM

## 2020-03-21 LAB — CMP14+EGFR
ALT: 22 IU/L (ref 0–32)
AST: 38 IU/L (ref 0–40)
Albumin/Globulin Ratio: 1.5 (ref 1.2–2.2)
Albumin: 3.5 g/dL — ABNORMAL LOW (ref 3.6–4.6)
Alkaline Phosphatase: 89 IU/L (ref 48–121)
BUN/Creatinine Ratio: 25 (ref 12–28)
BUN: 24 mg/dL (ref 8–27)
Bilirubin Total: 0.5 mg/dL (ref 0.0–1.2)
CO2: 23 mmol/L (ref 20–29)
Calcium: 8.8 mg/dL (ref 8.7–10.3)
Chloride: 97 mmol/L (ref 96–106)
Creatinine, Ser: 0.96 mg/dL (ref 0.57–1.00)
GFR calc Af Amer: 64 mL/min/{1.73_m2} (ref >59)
GFR calc non Af Amer: 56 mL/min/{1.73_m2} — ABNORMAL LOW (ref >59)
Globulin, Total: 2.4 g/dL (ref 1.5–4.5)
Glucose: 108 mg/dL — ABNORMAL HIGH (ref 65–99)
Potassium: 3.8 mmol/L (ref 3.5–5.2)
Sodium: 136 mmol/L (ref 134–144)
Total Protein: 5.9 g/dL — ABNORMAL LOW (ref 6.0–8.5)

## 2020-03-21 LAB — HEPATIC FUNCTION PANEL
ALT: 33 U/L (ref 0–44)
AST: 37 U/L (ref 15–41)
Albumin: 2.5 g/dL — ABNORMAL LOW (ref 3.5–5.0)
Alkaline Phosphatase: 80 U/L (ref 38–126)
Bilirubin, Direct: 0.2 mg/dL (ref 0.0–0.2)
Indirect Bilirubin: 0.2 mg/dL — ABNORMAL LOW (ref 0.3–0.9)
Total Bilirubin: 0.4 mg/dL (ref 0.3–1.2)
Total Protein: 5.8 g/dL — ABNORMAL LOW (ref 6.5–8.1)

## 2020-03-21 LAB — CBG MONITORING, ED: Glucose-Capillary: 132 mg/dL — ABNORMAL HIGH (ref 70–99)

## 2020-03-21 LAB — URINALYSIS, ROUTINE W REFLEX MICROSCOPIC
Bilirubin Urine: NEGATIVE
Glucose, UA: NEGATIVE mg/dL
Ketones, ur: NEGATIVE mg/dL
Nitrite: NEGATIVE
Protein, ur: 100 mg/dL — AB
Specific Gravity, Urine: 1.01 (ref 1.005–1.030)
pH: 5.5 (ref 5.0–8.0)

## 2020-03-21 LAB — MAGNESIUM
Magnesium: 2.1 mg/dL (ref 1.6–2.3)
Magnesium: 1.8 mg/dL (ref 1.7–2.4)

## 2020-03-21 LAB — BASIC METABOLIC PANEL
Anion gap: 14 (ref 5–15)
BUN: 31 mg/dL — ABNORMAL HIGH (ref 8–23)
CO2: 22 mmol/L (ref 22–32)
Calcium: 8.4 mg/dL — ABNORMAL LOW (ref 8.9–10.3)
Chloride: 98 mmol/L (ref 98–111)
Creatinine, Ser: 1.1 mg/dL — ABNORMAL HIGH (ref 0.44–1.00)
GFR calc Af Amer: 55 mL/min — ABNORMAL LOW (ref >60)
GFR calc non Af Amer: 47 mL/min — ABNORMAL LOW (ref >60)
Glucose, Bld: 126 mg/dL — ABNORMAL HIGH (ref 70–99)
Potassium: 3.8 mmol/L (ref 3.5–5.1)
Sodium: 134 mmol/L — ABNORMAL LOW (ref 135–145)

## 2020-03-21 LAB — CBC
HCT: 30.5 % — ABNORMAL LOW (ref 36.0–46.0)
Hematocrit: 28.7 % — ABNORMAL LOW (ref 34.0–46.6)
Hemoglobin: 8.7 g/dL — ABNORMAL LOW (ref 11.1–15.9)
Hemoglobin: 9.1 g/dL — ABNORMAL LOW (ref 12.0–15.0)
MCH: 22.8 pg — ABNORMAL LOW (ref 26.6–33.0)
MCH: 23.6 pg — ABNORMAL LOW (ref 26.0–34.0)
MCHC: 30.3 g/dL — ABNORMAL LOW (ref 31.5–35.7)
MCHC: 29.8 g/dL — ABNORMAL LOW (ref 30.0–36.0)
MCV: 75 fL — ABNORMAL LOW (ref 79–97)
MCV: 79 fL — ABNORMAL LOW (ref 80.0–100.0)
Platelets: 399 10*3/uL (ref 150–450)
Platelets: 427 10*3/uL — ABNORMAL HIGH (ref 150–400)
RBC: 3.82 x10E6/uL (ref 3.77–5.28)
RBC: 3.86 MIL/uL — ABNORMAL LOW (ref 3.87–5.11)
RDW: 15.5 % — ABNORMAL HIGH (ref 11.7–15.4)
RDW: 16.8 % — ABNORMAL HIGH (ref 11.5–15.5)
WBC: 12.7 10*3/uL — ABNORMAL HIGH (ref 3.4–10.8)
WBC: 19.5 10*3/uL — ABNORMAL HIGH (ref 4.0–10.5)
nRBC: 0 % (ref 0.0–0.2)

## 2020-03-21 LAB — LACTIC ACID, PLASMA: Lactic Acid, Venous: 1.2 mmol/L (ref 0.5–1.9)

## 2020-03-21 LAB — I-STAT CHEM 8, ED
BUN: 28 mg/dL — ABNORMAL HIGH (ref 8–23)
Calcium, Ion: 1.1 mmol/L — ABNORMAL LOW (ref 1.15–1.40)
Chloride: 97 mmol/L — ABNORMAL LOW (ref 98–111)
Creatinine, Ser: 1.1 mg/dL — ABNORMAL HIGH (ref 0.44–1.00)
Glucose, Bld: 122 mg/dL — ABNORMAL HIGH (ref 70–99)
HCT: 26 % — ABNORMAL LOW (ref 36.0–46.0)
Hemoglobin: 8.8 g/dL — ABNORMAL LOW (ref 12.0–15.0)
Potassium: 3.4 mmol/L — ABNORMAL LOW (ref 3.5–5.1)
Sodium: 135 mmol/L (ref 135–145)
TCO2: 25 mmol/L (ref 22–32)

## 2020-03-21 LAB — URINALYSIS, MICROSCOPIC (REFLEX)

## 2020-03-21 LAB — PROTIME-INR
INR: 2.7 — ABNORMAL HIGH (ref 0.8–1.2)
Prothrombin Time: 28.2 seconds — ABNORMAL HIGH (ref 11.4–15.2)

## 2020-03-21 LAB — APTT: aPTT: 45 seconds — ABNORMAL HIGH (ref 24–36)

## 2020-03-21 LAB — SARS CORONAVIRUS 2 BY RT PCR (HOSPITAL ORDER, PERFORMED IN ~~LOC~~ HOSPITAL LAB): SARS Coronavirus 2: NEGATIVE

## 2020-03-21 LAB — BRAIN NATRIURETIC PEPTIDE
B Natriuretic Peptide: 1091.1 pg/mL — ABNORMAL HIGH (ref 0.0–100.0)
BNP: 874.3 pg/mL — ABNORMAL HIGH (ref 0.0–100.0)

## 2020-03-21 LAB — TROPONIN I (HIGH SENSITIVITY): Troponin I (High Sensitivity): 80 ng/L — ABNORMAL HIGH (ref <18)

## 2020-03-21 IMAGING — DX DG CHEST 1V PORT
1 series · 1 of 1 positions shown · non-contrast
Comparison: Chest radiograph dated 06/21/2017.

CLINICAL DATA: 83-year-old female with questionable sepsis.

EXAM:
PORTABLE CHEST 1 VIEW

[chest]
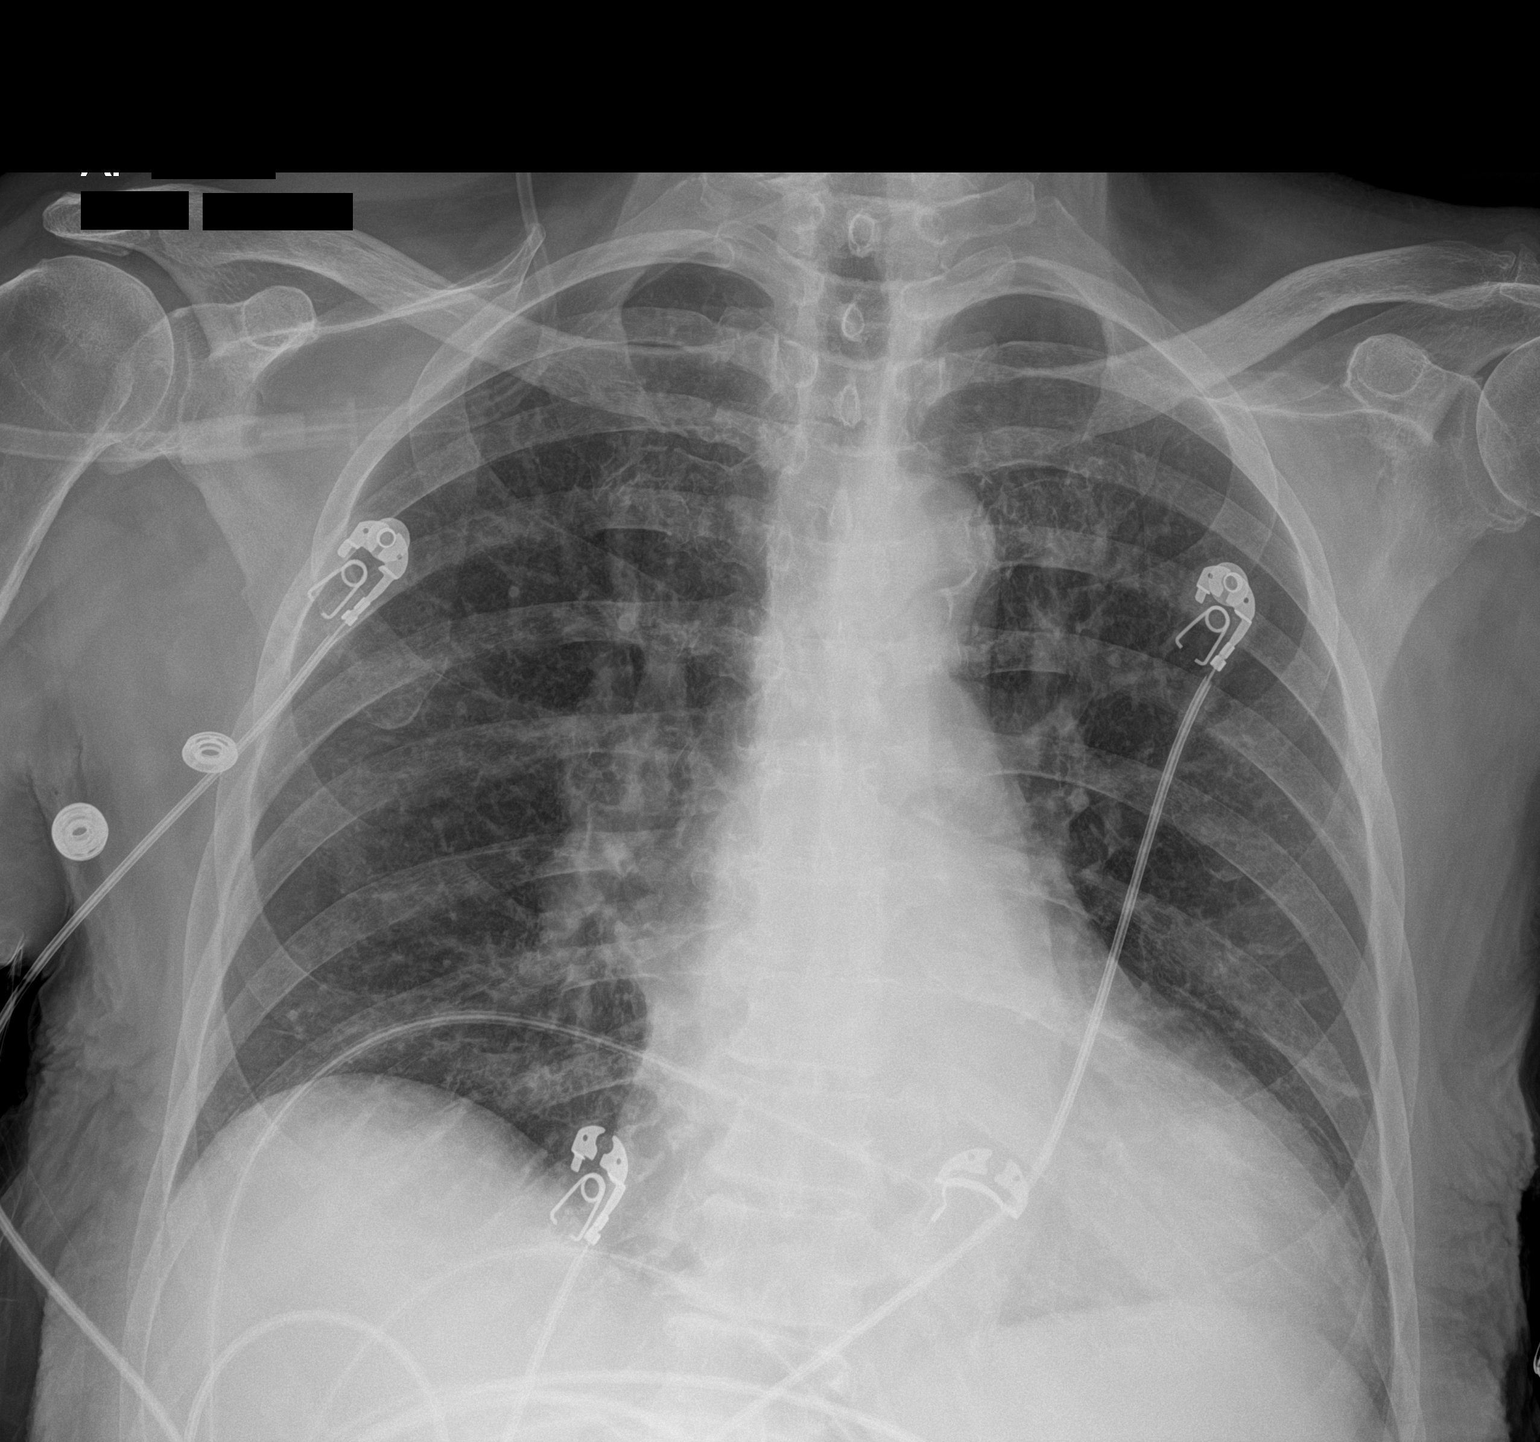

[1 of 1 positions shown; findings below may reference images not displayed]

FINDINGS: Mild chronic interstitial coarsening. No focal consolidation,
pleural effusion, or pneumothorax. Stable mild cardiomegaly.
Atherosclerotic calcification of the aorta. No acute osseous
pathology.
IMPRESSION: No active disease.

## 2020-03-21 MED ORDER — ASPIRIN 81 MG PO CHEW
324.0000 mg | CHEWABLE_TABLET | Freq: Once | ORAL | Status: AC
  Administered 2020-03-21: 324 mg via ORAL
  Filled 2020-03-21: qty 4

## 2020-03-21 MED ORDER — ACETAMINOPHEN 325 MG PO TABS
650.0000 mg | ORAL_TABLET | Freq: Four times a day (QID) | ORAL | Status: DC | PRN
  Administered 2020-03-21 – 2020-03-27 (×14): 650 mg via ORAL
  Filled 2020-03-21 (×14): qty 2

## 2020-03-21 MED ORDER — METOPROLOL TARTRATE 25 MG PO TABS: 25.0000 mg | ORAL_TABLET | Freq: Once | ORAL | Status: DC

## 2020-03-21 MED ORDER — SODIUM CHLORIDE 0.9 % IV SOLN
1.0000 g | INTRAVENOUS | Status: DC
  Administered 2020-03-22: 1 g via INTRAVENOUS
  Filled 2020-03-21: qty 10

## 2020-03-21 MED ORDER — FENTANYL CITRATE (PF) 100 MCG/2ML IJ SOLN
25.0000 ug | Freq: Once | INTRAMUSCULAR | Status: AC
  Administered 2020-03-21: 25 ug via INTRAVENOUS
  Filled 2020-03-21: qty 2

## 2020-03-21 MED ORDER — ETOMIDATE 2 MG/ML IV SOLN
INTRAVENOUS | Status: AC | PRN
  Administered 2020-03-21: 10 mg via INTRAVENOUS

## 2020-03-21 MED ORDER — ETOMIDATE 2 MG/ML IV SOLN
10.0000 mg | Freq: Once | INTRAVENOUS | Status: DC
  Filled 2020-03-21: qty 10

## 2020-03-21 NOTE — ED Notes (Signed)
Fever reported to rn

## 2020-03-21 NOTE — Progress Notes (Signed)
RT to bedside for cardioversion. Pt on 2L [place], EtCO2 around 30. RT will continue to monitor.

## 2020-03-21 NOTE — ED Triage Notes (Addendum)
Pt C/o chest pressure lasting all day. Pt states she called EMS because she was "tired of feeling bad all day". Pt is scheduled for cardioversion here at [facility] for "A-flutter". Pressure 5/10 on arrival.

## 2020-03-21 NOTE — ED Notes (Signed)
Repeat ekg done

## 2020-03-21 NOTE — ED Provider Notes (Signed)
[facility] EMERGENCY DEPARTMENT Provider Note   CSN: [id number] Arrival date & time: 03/21/20  2105     History Chief Complaint  Patient presents with  . Chest Pain    Heidi Adams is a 84 y.o. female with medical history significant for thyroiditis, hypothyroidism, combined systolic and diastolic heart failure (last EF around 50%) recent diagnosis of atrial fibrillation presented to the ED today for sensation of palpitations, chest pressure, malaise.  Patient states that she has had palpitations and racing heart rate over the last month for the last week particularly she has been feeling increasingly fatigued and weak, noted to have a fever 2 days ago that responded to Tylenol.  Denies cough or changes in her urine.  States the chest pressure is dull, stretches across her chest, worse with exertion.  On arrival, patient with heart rate as high as the 150s, notably febrile to 102.  The history is provided by the patient.  Illness Quality:  Chest pain Severity:  Moderate Onset quality:  Gradual Duration:  1 week Timing:  Intermittent Progression:  Waxing and waning Chronicity:  New Context:  Known atrial fibrillation     Interview, patient with known history of atrial fibrillation, on anticoagulation with planned cardioversion 03/24/2020  Past Medical History:  Diagnosis Date  . Allergy   . Anemia    hx of anemia  . Arthritis   . Breast cancer ([place])    Left breast  . Cancer ([place])    skin ca nose  . Chronic combined systolic and diastolic CHF (congestive heart failure) ([place]) 09/22/2018  . Chronic combined systolic and diastolic CHF (congestive heart failure) ([place]) 09/22/2018  . Chronic combined systolic and diastolic CHF (congestive heart failure) ([place]) 09/22/2018  . Dysrhythmia    went into Atrial Fibrillation after last surgery, HAS PALPITATIONS  . History of skin cancer   . Hypertension   . Hypothyroidism   . PVC (premature ventricular contraction)    . Thyroiditis 1973    Patient Active Problem List   Diagnosis Date Noted  . Acute cystitis 03/22/2020  . Atrial fibrillation with RVR ([place]) 03/22/2020  . History of congestive heart failure 03/22/2020  . Dizziness 06/22/2019  . Arthritis of wrist 05/05/2019  . DDD (degenerative disc disease), cervical 08/20/2018  . Left elbow pain 08/02/2018  . Neck pain 08/02/2018  . TIA (transient ischemic attack) 07/30/2018  . Hypertensive urgency 07/30/2018  . AF (paroxysmal atrial fibrillation) ([place]) 07/30/2018  . Arthritis of carpometacarpal [facility]) joint of left thumb 06/20/2018  . Pain in thumb joint with movement of left hand 05/08/2018  . Pain in finger of right hand 01/02/2018  . Pain in left knee 11/08/2017  . Conductive hearing loss, bilateral 10/19/2017  . Dry nose 10/19/2017  . Chronic hip pain after total replacement of left hip joint 08/25/2017  . Trochanteric bursitis of left hip 08/25/2017  . Localized, primary osteoarthritis of elbow 08/22/2017  . Synovitis and tenosynovitis 08/11/2017  . Malignant neoplasm of upper-inner quadrant of left breast in female, estrogen receptor positive ([place]) 06/20/2017  . S/P closed reduction of dislocated total hip prosthesis 02/03/2017  . S/P left THA, AA 01/10/2017  . Fatigue 05/31/2016  . Overweight (BMI 25.0-29.9) 05/11/2016  . S/P right THA, AA 05/10/2016  . Right hip pain 03/16/2016  . Pes planus 03/16/2016  . Abnormality of gait 03/16/2016  . PAC (premature atrial contraction) 02/24/2016  . PVC (premature ventricular contraction) 02/24/2016  . Palpitations 01/27/2016  . Benign  paroxysmal positional vertigo of right ear 12/15/2015  . Pharyngoesophageal dysphagia 12/15/2015  . Hypothyroidism 11/26/2015  . Essential hypertension 11/26/2015  . S/P left TKA 09/28/2015  . Lichen sclerosus 35/46/5681    Past Surgical History:  Procedure Laterality Date  . ABDOMINAL HYSTERECTOMY  89   TAH BSO  . APPENDECTOMY    . BREAST LUMPECTOMY  Left 05/30/2017  . BREAST LUMPECTOMY WITH RADIOACTIVE SEED LOCALIZATION Left 05/30/2017   Procedure: LEFT BREAST LUMPECTOMY WITH RADIOACTIVE SEED LOCALIZATION;  Surgeon: Excell Seltzer, MD;  Location: [place];  Service: General;  Laterality: Left;  . CATARACT EXTRACTION  09/10/2018   Right eye  . CHOLECYSTECTOMY     dr Georgia Lopes  . ELBOW SURGERY    . EYE SURGERY     "on my eyelids" years ago  . KNEE ARTHROSCOPY     left  . THYROIDECTOMY  1973  . TONSILLECTOMY    . TOTAL HIP ARTHROPLASTY Right 05/10/2016   Procedure: TOTAL HIP ARTHROPLASTY ANTERIOR APPROACH;  Surgeon: Paralee Cancel, MD;  Location: [facility];  Service: Orthopedics;  Laterality: Right;  . TOTAL HIP ARTHROPLASTY Left 01/10/2017   Procedure: LEFT TOTAL HIP ARTHROPLASTY ANTERIOR APPROACH;  Surgeon: Paralee Cancel, MD;  Location: [facility];  Service: Orthopedics;  Laterality: Left;  . TOTAL KNEE ARTHROPLASTY Left 09/28/2015   Procedure: LEFT TOTAL KNEE ARTHROPLASTY;  Surgeon: Paralee Cancel, MD;  Location: [facility];  Service: Orthopedics;  Laterality: Left;  . TOTAL KNEE ARTHROPLASTY Right 08/09/2016   Procedure: RIGHT TOTAL KNEE ARTHROPLASTY;  Surgeon: Paralee Cancel, MD;  Location: [facility];  Service: Orthopedics;  Laterality: Right;  Adductor Block  . Total Left hip arthroplasty     01/10/17 Dr. Alvan Dame     OB History    Gravida  2   Para  2   Term      Preterm      AB      Living  2     SAB      TAB      Ectopic      Multiple      Live Births              Family History  Problem Relation Age of Onset  . Diabetes Father   . Hypertension Father   . Stroke Father   . Breast cancer Cousin   . Pneumonia Sister     Social History   Tobacco Use  . Smoking status: Former Smoker    Packs/day: 1.00    Years: 20.00    Pack years: 20.00    Types: Cigarettes    Quit date: 07/26/1987    Years since quitting: 32.6  . Smokeless tobacco: Never Used  Vaping Use  . Vaping Use: Never used  Substance Use  Topics  . Alcohol use: No    Alcohol/week: 0.0 standard drinks  . Drug use: No    Home Medications Prior to Admission medications   Medication Sig Start Date End Date Taking? Authorizing Provider  acetaminophen (TYLENOL) 500 MG tablet Take 1,000 mg by mouth 3 (three) times daily as needed for moderate pain.     [provider]  apixaban (ELIQUIS) 5 MG TABS tablet Take 1 tablet (5 mg total) by mouth 2 (two) times daily. 08/07/19   Adrian Prows, MD  furosemide (LASIX) 40 MG tablet Take 1 tablet (40 mg total) by mouth 2 (two) times daily. 03/19/20 06/17/20  Adrian Prows, MD  levothyroxine (SYNTHROID, LEVOTHROID) 112 MCG tablet Take  112-168 mcg by mouth daily before breakfast. Take 1 tablet (112 mcg) on Mondays, Tuesdays, Wednesdays, Thursdays, Fridays & Saturdays. Take 1.5 tablet (168 mcg) on Sundays    [provider]  lidocaine (LMX) 4 % cream Apply 1 application topically 4 (four) times daily as needed (pain.).     [provider]  metoprolol tartrate (LOPRESSOR) 25 MG tablet Take 1 tablet (25 mg total) by mouth 2 (two) times daily. 03/19/20 06/17/20  Adrian Prows, MD  pantoprazole (PROTONIX) 40 MG tablet Take 40 mg by mouth daily as needed (indigestion/heartburn.).  01/20/16   [provider]  potassium chloride (KLOR-CON) 10 MEQ tablet Take 1 tablet (10 mEq total) by mouth 2 (two) times daily. 03/19/20   Adrian Prows, MD    Allergies    Codeine, Epinephrine, and Tenormin [atenolol]  Review of Systems   Review of Systems  Physical Exam Updated Vital Signs BP (!) 126/94   Pulse (!) 146   Temp 98.8 F (37.1 C) (Oral)   Resp (!) 22   Ht 5\' 4"  (1.626 m)   Wt 60.8 kg   LMP 07/25/1988 Comment: full   SpO2 99%   BMI 23.00 kg/m   Physical Exam Constitutional:      General: She is in acute distress.     Appearance: She is ill-appearing.  HENT:     Head: Normocephalic and atraumatic.     Mouth/Throat:     Mouth: Mucous membranes are moist.     Pharynx:  Oropharynx is clear.  Eyes:     General: No scleral icterus.    Pupils: Pupils are equal, round, and reactive to light.  Cardiovascular:     Rate and Rhythm: Tachycardia present. Rhythm irregular.     Pulses: Normal pulses.          Carotid pulses are 2+ on the right side and 2+ on the left side.      Radial pulses are 2+ on the right side and 2+ on the left side.       Dorsalis pedis pulses are 2+ on the right side and 2+ on the left side.       Posterior tibial pulses are 2+ on the right side and 2+ on the left side.     Heart sounds: Normal heart sounds.  Pulmonary:     Effort: Pulmonary effort is normal. No respiratory distress.     Breath sounds: No wheezing, rhonchi or rales.  Chest:     Chest wall: No tenderness.  Abdominal:     General: There is no distension.     Tenderness: There is no abdominal tenderness.  Musculoskeletal:        General: No tenderness or deformity.     Cervical back: Normal range of motion and neck supple.     Right lower leg: No edema.     Left lower leg: No edema.  Neurological:     General: No focal deficit present.     Mental Status: She is alert and oriented to person, place, and time.  Psychiatric:        Mood and Affect: Mood normal.        Behavior: Behavior normal.     ED Results / Procedures / Treatments   Labs (all labs ordered are listed, but only abnormal results are displayed) Labs Reviewed  BASIC METABOLIC PANEL - Abnormal; Notable for the following components:      Result Value   Sodium 134 (*)  Glucose, Bld 126 (*)    BUN 31 (*)    Creatinine, Ser 1.10 (*)    Calcium 8.4 (*)    GFR calc non Af Amer 47 (*)    GFR calc Af Amer 55 (*)    All other components within normal limits  CBC - Abnormal; Notable for the following components:   WBC 19.5 (*)    RBC 3.86 (*)    Hemoglobin 9.1 (*)    HCT 30.5 (*)    MCV 79.0 (*)    MCH 23.6 (*)    MCHC 29.8 (*)    RDW 16.8 (*)    Platelets 427 (*)    All other components  within normal limits  PROTIME-INR - Abnormal; Notable for the following components:   Prothrombin Time 28.2 (*)    INR 2.7 (*)    All other components within normal limits  APTT - Abnormal; Notable for the following components:   aPTT 45 (*)    All other components within normal limits  URINALYSIS, ROUTINE W REFLEX MICROSCOPIC - Abnormal; Notable for the following components:   Hgb urine dipstick SMALL (*)    Protein, ur 100 (*)    Leukocytes,Ua MODERATE (*)    All other components within normal limits  BRAIN NATRIURETIC PEPTIDE - Abnormal; Notable for the following components:   B Natriuretic Peptide 1,091.1 (*)    All other components within normal limits  HEPATIC FUNCTION PANEL - Abnormal; Notable for the following components:   Total Protein 5.8 (*)    Albumin 2.5 (*)    Indirect Bilirubin 0.2 (*)    All other components within normal limits  TSH - Abnormal; Notable for the following components:   TSH 5.095 (*)    All other components within normal limits  URINALYSIS, MICROSCOPIC (REFLEX) - Abnormal; Notable for the following components:   Bacteria, UA MANY (*)    All other components within normal limits  CBG MONITORING, ED - Abnormal; Notable for the following components:   Glucose-Capillary 132 (*)    All other components within normal limits  I-STAT CHEM 8, ED - Abnormal; Notable for the following components:   Potassium 3.4 (*)    Chloride 97 (*)    BUN 28 (*)    Creatinine, Ser 1.10 (*)    Glucose, Bld 122 (*)    Calcium, Ion 1.10 (*)    Hemoglobin 8.8 (*)    HCT 26.0 (*)    All other components within normal limits  TROPONIN I (HIGH SENSITIVITY) - Abnormal; Notable for the following components:   Troponin I (High Sensitivity) 80 (*)    All other components within normal limits  TROPONIN I (HIGH SENSITIVITY) - Abnormal; Notable for the following components:   Troponin I (High Sensitivity) 92 (*)    All other components within normal limits  SARS CORONAVIRUS 2  BY RT PCR (HOSPITAL ORDER, [place] [facility])  URINE CULTURE  CULTURE, BLOOD (ROUTINE X 2)  CULTURE, BLOOD (ROUTINE X 2)  MAGNESIUM  LACTIC ACID, PLASMA    EKG None  Radiology DG Chest Port 1 View  Result Date: 03/21/2020 CLINICAL DATA:  84 year old female with questionable sepsis. EXAM: PORTABLE CHEST 1 VIEW COMPARISON:  Chest radiograph dated 06/21/2017. FINDINGS: Mild chronic interstitial coarsening. No focal consolidation, pleural effusion, or pneumothorax. Stable mild cardiomegaly. Atherosclerotic calcification of the aorta. No acute osseous pathology. IMPRESSION: No active disease. Electronically Signed   By: Anner Crete M.D.   On: 03/21/2020  21:45    Procedures .Sedation  Date/Time: 03/21/2020 11:39 PM Performed by: Renold Genta, MD Authorized by: Isla Pence, MD   Consent:    Consent obtained:  Written   Consent given by:  Patient   Risks discussed:  Allergic reaction, dysrhythmia, inadequate sedation, nausea, vomiting, respiratory compromise necessitating ventilatory assistance and intubation, prolonged hypoxia resulting in organ damage and prolonged sedation necessitating reversal   Alternatives discussed:  Analgesia without sedation, anxiolysis and regional anesthesia Universal protocol:    Procedure explained and questions answered to patient or proxy's satisfaction: yes     Relevant documents present and verified: yes     Immediately prior to procedure a time out was called: yes   Indications:    Procedure performed:  Cardioversion Pre-sedation assessment:    Time since last food or drink:  6hr   ASA classification: class 2 - patient with mild systemic disease     Mouth opening:  3 or more finger widths   Mallampati score:  II - soft palate, uvula, fauces visible   Pre-sedation assessments completed and reviewed: airway patency, cardiovascular function, mental status, nausea/vomiting, pain level, respiratory function and  temperature     Pre-sedation assessment completed:  03/21/2020 10:30 PM Procedure details (see MAR for exact dosages):    Preoxygenation:  Nasal cannula   Sedation:  Etomidate   Intended level of sedation: deep   Analgesia:  Fentanyl   Intra-procedure monitoring:  Blood pressure monitoring, cardiac monitor, continuous capnometry, continuous pulse oximetry, frequent LOC assessments and frequent vital sign checks   Intra-procedure events: none     Total Provider sedation time (minutes):  5 Post-procedure details:    Post-sedation assessment completed:  03/21/2020 11:40 PM   Attendance: Constant attendance by certified staff until patient recovered     Recovery: Patient returned to pre-procedure baseline     Post-sedation assessments completed and reviewed: airway patency, cardiovascular function, mental status, nausea/vomiting, pain level, respiratory function and temperature     Patient is stable for discharge or admission: yes     Patient tolerance:  Tolerated well, no immediate complications .Cardioversion  Date/Time: 03/21/2020 11:41 PM Performed by: Renold Genta, MD Authorized by: Isla Pence, MD   Consent:    Consent obtained:  Written   Consent given by:  Patient   Risks discussed:  Induced arrhythmia, cutaneous burn and pain   Alternatives discussed:  No treatment, rate-control medication, alternative treatment, referral, observation, delayed treatment and anti-coagulation medication Pre-procedure details:    Cardioversion basis:  Elective   Rhythm:  Atrial fibrillation   Electrode placement:  Anterior-posterior Patient sedated: Yes. Refer to sedation procedure documentation for details of sedation.  Attempt one:    Cardioversion mode:  Synchronous   Waveform:  Biphasic   Shock (Joules):  200   Shock outcome:  No change in rhythm Attempt two:    Cardioversion mode:  Synchronous   Waveform:  Biphasic   Shock (Joules):  200   Shock outcome:  Conversion to normal  sinus rhythm Post-procedure details:    Patient status:  Unresponsive   Patient tolerance of procedure:  Tolerated well, no immediate complications   (including critical care time)  Medications Ordered in ED Medications  acetaminophen (TYLENOL) tablet 650 mg (650 mg Oral Given 03/21/20 2216)  etomidate (AMIDATE) injection 10 mg (has no administration in time range)  cefTRIAXone (ROCEPHIN) 1 g in sodium chloride 0.9 % 100 mL IVPB (has no administration in time range)  aspirin chewable tablet 324 mg (324 mg  Oral Given 03/21/20 2143)  fentaNYL (SUBLIMAZE) injection 25 mcg (25 mcg Intravenous Given 03/21/20 2217)  etomidate (AMIDATE) injection (10 mg Intravenous Given 03/21/20 2240)    ED Course  I have reviewed the triage vital signs and the nursing notes.  Pertinent labs & imaging results that were available during my care of the patient were reviewed by me and considered in my medical decision making (see chart for details).    MDM Rules/Calculators/A&P                         EKG findings by my read: Compared to prior: 03/19/2020.  Rate: 142 rhythm: Atrial fibrillation Axis: appropriate  PR: N/A QRS: 97 QTc: 433.  Atrial fibrillation with RVR, no frank ischemic changes appreciable  Differential diagnosis considered: Sepsis, arrhythmia, ACS, PE, viral illness  Patient presents to ED with chest discomfort and palpitations also malaise and weakness and notably on arrival febrile to 102 degrees.  No clear source of infection on arrival, patient endorsing some fever a couple days ago as well.  Case discussed with Dr. [name] with cardiology, feel patient would benefit from synchronized cardioversion since she is fully anticoagulated and is scheduled for cardioversion anyway, hesitate to administer beta-blockers in the setting of sepsis.  Cardioversion performed under sedation as above.  Patient presentation and vitals highly concerning for sepsis.  Broad sepsis work-up initiated with urine  appearing to be source of infection.  Will not administer 30 cc/kg of fluids in her since she is not in septic shock and she does have a history of heart failure, elevated BNP.  We will minister Rocephin for urinary tract infection, cultures pending.  Feel the patient would benefit from inpatient admission.  Hospitalist paged for admission who agreed to admit to their service  Final Clinical Impression(s) / ED Diagnoses Final diagnoses:  Atrial fibrillation with RVR (HCC)  Elevated troponin  Sepsis without acute organ dysfunction, due to unspecified organism [facility])    Rx / DC Orders ED Discharge Orders    None     Labs, studies and imaging reviewed by myself and considered in medical decision making if ordered. Imaging interpreted by radiology. Pt was discussed with my attending, Dr. Gilford Raid  Electronically signed by:  Roderic Palau [place]8/29/202112:40 AM       Renold Genta, MD 03/22/20 0040    Isla Pence, MD 03/22/20 [phone]

## 2020-03-22 ENCOUNTER — Other Ambulatory Visit: Payer: Self-pay

## 2020-03-22 DIAGNOSIS — Z96653 Presence of artificial knee joint, bilateral: Secondary | ICD-10-CM | POA: Diagnosis not present

## 2020-03-22 DIAGNOSIS — Z90710 Acquired absence of both cervix and uterus: Secondary | ICD-10-CM | POA: Diagnosis not present

## 2020-03-22 DIAGNOSIS — C50212 Malignant neoplasm of upper-inner quadrant of left female breast: Secondary | ICD-10-CM | POA: Diagnosis not present

## 2020-03-22 DIAGNOSIS — B9620 Unspecified Escherichia coli [E. coli] as the cause of diseases classified elsewhere: Secondary | ICD-10-CM | POA: Diagnosis not present

## 2020-03-22 DIAGNOSIS — I480 Paroxysmal atrial fibrillation: Secondary | ICD-10-CM | POA: Diagnosis not present

## 2020-03-22 DIAGNOSIS — R1314 Dysphagia, pharyngoesophageal phase: Secondary | ICD-10-CM | POA: Diagnosis not present

## 2020-03-22 DIAGNOSIS — Z8679 Personal history of other diseases of the circulatory system: Secondary | ICD-10-CM

## 2020-03-22 DIAGNOSIS — Z170 Estrogen receptor positive status [ER+]: Secondary | ICD-10-CM | POA: Diagnosis not present

## 2020-03-22 DIAGNOSIS — I10 Essential (primary) hypertension: Secondary | ICD-10-CM

## 2020-03-22 DIAGNOSIS — N3000 Acute cystitis without hematuria: Secondary | ICD-10-CM | POA: Diagnosis present

## 2020-03-22 DIAGNOSIS — I110 Hypertensive heart disease with heart failure: Secondary | ICD-10-CM | POA: Diagnosis not present

## 2020-03-22 DIAGNOSIS — I5022 Chronic systolic (congestive) heart failure: Secondary | ICD-10-CM | POA: Diagnosis not present

## 2020-03-22 DIAGNOSIS — I4891 Unspecified atrial fibrillation: Secondary | ICD-10-CM | POA: Diagnosis not present

## 2020-03-22 DIAGNOSIS — Z7901 Long term (current) use of anticoagulants: Secondary | ICD-10-CM | POA: Diagnosis not present

## 2020-03-22 DIAGNOSIS — Z96643 Presence of artificial hip joint, bilateral: Secondary | ICD-10-CM | POA: Diagnosis not present

## 2020-03-22 DIAGNOSIS — R778 Other specified abnormalities of plasma proteins: Secondary | ICD-10-CM | POA: Diagnosis not present

## 2020-03-22 DIAGNOSIS — Z20822 Contact with and (suspected) exposure to covid-19: Secondary | ICD-10-CM | POA: Diagnosis not present

## 2020-03-22 DIAGNOSIS — Z87891 Personal history of nicotine dependence: Secondary | ICD-10-CM | POA: Diagnosis not present

## 2020-03-22 DIAGNOSIS — R7881 Bacteremia: Secondary | ICD-10-CM | POA: Diagnosis not present

## 2020-03-22 DIAGNOSIS — E039 Hypothyroidism, unspecified: Secondary | ICD-10-CM

## 2020-03-22 DIAGNOSIS — Z85828 Personal history of other malignant neoplasm of skin: Secondary | ICD-10-CM | POA: Diagnosis not present

## 2020-03-22 LAB — TROPONIN I (HIGH SENSITIVITY): Troponin I (High Sensitivity): 92 ng/L — ABNORMAL HIGH (ref <18)

## 2020-03-22 LAB — BLOOD CULTURE ID PANEL (REFLEXED) - BCID2

## 2020-03-22 LAB — TSH: TSH: 5.095 u[IU]/mL — ABNORMAL HIGH (ref 0.350–4.500)

## 2020-03-22 MED ORDER — SODIUM CHLORIDE 0.9 % IV SOLN
2.0000 g | INTRAVENOUS | Status: DC
  Filled 2020-03-22: qty 20

## 2020-03-22 MED ORDER — METOPROLOL TARTRATE 25 MG PO TABS
25.0000 mg | ORAL_TABLET | Freq: Two times a day (BID) | ORAL | Status: DC
  Administered 2020-03-22 – 2020-03-24 (×6): 25 mg via ORAL
  Filled 2020-03-22 (×6): qty 1

## 2020-03-22 MED ORDER — AMIODARONE HCL 200 MG PO TABS
200.0000 mg | ORAL_TABLET | Freq: Two times a day (BID) | ORAL | Status: DC
  Administered 2020-03-22 – 2020-03-27 (×11): 200 mg via ORAL
  Filled 2020-03-22 (×11): qty 1

## 2020-03-22 MED ORDER — PANTOPRAZOLE SODIUM 40 MG PO TBEC: 40.0000 mg | DELAYED_RELEASE_TABLET | Freq: Every day | ORAL | Status: DC | PRN

## 2020-03-22 MED ORDER — APIXABAN 5 MG PO TABS
5.0000 mg | ORAL_TABLET | Freq: Two times a day (BID) | ORAL | Status: DC
  Administered 2020-03-22 – 2020-03-24 (×6): 5 mg via ORAL
  Filled 2020-03-22 (×6): qty 1

## 2020-03-22 MED ORDER — MAGNESIUM OXIDE 400 (241.3 MG) MG PO TABS
400.0000 mg | ORAL_TABLET | Freq: Two times a day (BID) | ORAL | Status: DC
  Administered 2020-03-22 – 2020-03-27 (×11): 400 mg via ORAL
  Filled 2020-03-22 (×11): qty 1

## 2020-03-22 MED ORDER — METOPROLOL TARTRATE 5 MG/5ML IV SOLN
5.0000 mg | INTRAVENOUS | Status: DC | PRN
  Administered 2020-03-22: 5 mg via INTRAVENOUS
  Filled 2020-03-22: qty 5

## 2020-03-22 MED ORDER — OFF THE BEAT BOOK
Freq: Once | Status: DC
  Filled 2020-03-22: qty 1

## 2020-03-22 MED ORDER — LEVOTHYROXINE SODIUM 112 MCG PO TABS
168.0000 ug | ORAL_TABLET | ORAL | Status: DC
  Administered 2020-03-22: 168 ug via ORAL
  Filled 2020-03-22: qty 2

## 2020-03-22 MED ORDER — LEVOTHYROXINE SODIUM 112 MCG PO TABS
112.0000 ug | ORAL_TABLET | ORAL | Status: DC
  Administered 2020-03-23 – 2020-03-27 (×5): 112 ug via ORAL
  Filled 2020-03-22 (×6): qty 1

## 2020-03-22 MED ORDER — SODIUM CHLORIDE 0.9 % IV SOLN
2.0000 g | INTRAVENOUS | Status: DC
  Administered 2020-03-22 – 2020-03-23 (×2): 2 g via INTRAVENOUS
  Filled 2020-03-22 (×2): qty 20
  Filled 2020-03-22: qty 2

## 2020-03-22 MED ORDER — POTASSIUM CHLORIDE CRYS ER 20 MEQ PO TBCR
40.0000 meq | EXTENDED_RELEASE_TABLET | Freq: Once | ORAL | Status: AC
  Administered 2020-03-22: 40 meq via ORAL
  Filled 2020-03-22: qty 2

## 2020-03-22 MED ORDER — LIDOCAINE 4 % EX CREA
1.0000 "application " | TOPICAL_CREAM | Freq: Four times a day (QID) | CUTANEOUS | Status: DC | PRN
  Administered 2020-03-22 – 2020-03-26 (×5): 1 via TOPICAL
  Filled 2020-03-22 (×2): qty 5

## 2020-03-22 MED ORDER — LEVOTHYROXINE SODIUM 112 MCG PO TABS: 112.0000 ug | ORAL_TABLET | Freq: Every day | ORAL | Status: DC

## 2020-03-22 MED ORDER — MORPHINE SULFATE (PF) 2 MG/ML IV SOLN: 1.0000 mg | Freq: Once | INTRAVENOUS | Status: DC

## 2020-03-22 NOTE — Progress Notes (Signed)
PHARMACY - PHYSICIAN COMMUNICATION CRITICAL VALUE ALERT - BLOOD CULTURE IDENTIFICATION (BCID)  Heidi Adams is an 84 y.o. female who presented to [place] on 03/21/2020  Assessment:  73 yof presenting with SOB, afib with RVR. Patient on ceftriaxone for acute cystitis currently. Now 1 of 4 BCx bottles growing Ecoli.   Name of physician (or Provider) Contacted: Pokhrel, L  Current antibiotics: ceftriaxone 1g IV q24h  Changes to prescribed antibiotics recommended:  Increase to ceftriaxone 2g IV q24h per protocol  Results for orders placed or performed during the hospital encounter of 03/21/20  Blood Culture ID Panel (Reflexed) (Collected: 03/21/2020 11:49 PM)  Result Value Ref Range   Enterococcus faecalis NOT DETECTED NOT DETECTED   Enterococcus Faecium NOT DETECTED NOT DETECTED   Listeria monocytogenes NOT DETECTED NOT DETECTED   Staphylococcus species NOT DETECTED NOT DETECTED   Staphylococcus aureus (BCID) NOT DETECTED NOT DETECTED   Staphylococcus epidermidis NOT DETECTED NOT DETECTED   Staphylococcus lugdunensis NOT DETECTED NOT DETECTED   Streptococcus species NOT DETECTED NOT DETECTED   Streptococcus agalactiae NOT DETECTED NOT DETECTED   Streptococcus pneumoniae NOT DETECTED NOT DETECTED   Streptococcus pyogenes NOT DETECTED NOT DETECTED   A.calcoaceticus-baumannii NOT DETECTED NOT DETECTED   Bacteroides fragilis NOT DETECTED NOT DETECTED   Enterobacterales DETECTED (A) NOT DETECTED   Enterobacter cloacae complex NOT DETECTED NOT DETECTED   Escherichia coli DETECTED (A) NOT DETECTED   Klebsiella aerogenes NOT DETECTED NOT DETECTED   Klebsiella oxytoca NOT DETECTED NOT DETECTED   Klebsiella pneumoniae NOT DETECTED NOT DETECTED   Proteus species NOT DETECTED NOT DETECTED   Salmonella species NOT DETECTED NOT DETECTED   Serratia marcescens NOT DETECTED NOT DETECTED   Haemophilus influenzae NOT DETECTED NOT DETECTED   Neisseria meningitidis NOT DETECTED NOT DETECTED    Pseudomonas aeruginosa NOT DETECTED NOT DETECTED   Stenotrophomonas maltophilia NOT DETECTED NOT DETECTED   Candida albicans NOT DETECTED NOT DETECTED   Candida auris NOT DETECTED NOT DETECTED   Candida glabrata NOT DETECTED NOT DETECTED   Candida krusei NOT DETECTED NOT DETECTED   Candida parapsilosis NOT DETECTED NOT DETECTED   Candida tropicalis NOT DETECTED NOT DETECTED   Cryptococcus neoformans/gattii NOT DETECTED NOT DETECTED   CTX-M ESBL NOT DETECTED NOT DETECTED   Carbapenem resistance IMP NOT DETECTED NOT DETECTED   Carbapenem resistance KPC NOT DETECTED NOT DETECTED   Carbapenem resistance NDM NOT DETECTED NOT DETECTED   Carbapenem resist OXA 48 LIKE NOT DETECTED NOT DETECTED   Carbapenem resistance VIM NOT DETECTED NOT DETECTED    [name] 03/22/2020  3:24 PM

## 2020-03-22 NOTE — Plan of Care (Signed)
  Problem: Education: Goal: Knowledge of disease or condition will improve Outcome: Progressing   

## 2020-03-22 NOTE — Evaluation (Signed)
Clinical/Bedside Swallow Evaluation Patient Details  Name: Heidi Adams MRN: [id number] Date of Birth: [birth year]  Today's Date: 03/22/2020 Time: SLP Start Time (ACUTE ONLY): 47 SLP Stop Time (ACUTE ONLY): 6389 SLP Time Calculation (min) (ACUTE ONLY): 25 min  Past Medical History:  Past Medical History:  Diagnosis Date   Allergy    Anemia    hx of anemia   Arthritis    Breast cancer ([place])    Left breast   Cancer ([place])    skin ca nose   Chronic combined systolic and diastolic CHF (congestive heart failure) ([place]) 09/22/2018   Chronic combined systolic and diastolic CHF (congestive heart failure) ([place]) 09/22/2018   Chronic combined systolic and diastolic CHF (congestive heart failure) ([place]) 09/22/2018   Dysrhythmia    went into Atrial Fibrillation after last surgery, HAS PALPITATIONS   History of skin cancer    Hypertension    Hypothyroidism    PVC (premature ventricular contraction)    Thyroiditis 1973   Past Surgical History:  Past Surgical History:  Procedure Laterality Date   ABDOMINAL HYSTERECTOMY  90   TAH BSO   APPENDECTOMY     BREAST LUMPECTOMY Left 05/30/2017   BREAST LUMPECTOMY WITH RADIOACTIVE SEED LOCALIZATION Left 05/30/2017   Procedure: LEFT BREAST LUMPECTOMY WITH RADIOACTIVE SEED LOCALIZATION;  Surgeon: Excell Seltzer, MD;  Location: [place];  Service: General;  Laterality: Left;   CATARACT EXTRACTION  09/10/2018   Right eye   CHOLECYSTECTOMY     dr Georgia Lopes   ELBOW SURGERY     EYE SURGERY     "on my eyelids" years ago   KNEE ARTHROSCOPY     left   THYROIDECTOMY  1973   TONSILLECTOMY     TOTAL HIP ARTHROPLASTY Right 05/10/2016   Procedure: TOTAL HIP ARTHROPLASTY ANTERIOR APPROACH;  Surgeon: Paralee Cancel, MD;  [facility];  Service: Orthopedics;  Laterality: Right;   TOTAL HIP ARTHROPLASTY Left 01/10/2017   Procedure: LEFT TOTAL HIP ARTHROPLASTY ANTERIOR APPROACH;  Surgeon: Paralee Cancel, MD;  Location:  [facility];  Service: Orthopedics;  Laterality: Left;   TOTAL KNEE ARTHROPLASTY Left 09/28/2015   Procedure: LEFT TOTAL KNEE ARTHROPLASTY;  Surgeon: Paralee Cancel, MD;  [facility];  Service: Orthopedics;  Laterality: Left;   TOTAL KNEE ARTHROPLASTY Right 08/09/2016   Procedure: RIGHT TOTAL KNEE ARTHROPLASTY;  Surgeon: Paralee Cancel, MD;  [facility];  Service: Orthopedics;  Laterality: Right;  Adductor Block   Total Left hip arthroplasty     01/10/17 Dr. Alvan Dame   HPI:  This is a 84 year old female who presents with complaint of shortness of breath for the past 2 to 3 days.  She has a constellation of symptoms all starting 2 to 3 days ago.  These include heart palpitations, left-sided intermittent chest pains, lightheadedness and dizziness, as well as being sweaty and clammy.  Her chest pain is left-sided, intermittent and reproducible.  She reports current chest pain strength is 8/10.  She denies any coughing but she does report some wheezing.  She denies any fevers or chills, nausea or vomiting.  She denies any burning or urination.  In the ER the patient was tachycardic, heart rate up to 150s.  She also complained of chest pain.  She was also noted to have a fever fever of 102.  The ER physician discussed the situation with the cardiologist Dr. Daiva Huge, the decision was made to cardiovert the patient.  She tolerated the procedure well.  Currently rates her heart rate  in the 80s.  Hospitalist have been asked admit.  She was found to have acute cystitis, atrial fibrillation with RVR and pharyngoesophageal dysphagia.  Most recent chest xray was showing no active disease.     Assessment / Plan / Recommendation Clinical Impression  Clinical swallowing evaluation was completed due to concerns for pharyngoesophageal dysphagia.  Per patient, occassionally she has the feeling that food/liquids do not want to go down and that this has occurred since surgery to remove her thryoid.  She reported no swallow work  up following that surgery despite issues swallowing.  She has never been seen at [facility] for a swallow work up.   Cranial nerve exam was completed and generally unremarkable.  Very slight lingual deviation to the left was noted with movement.  Otherwise, facial, labial and jaw range of motion and strength were adequate.  Facial sensation appeared to be intact and she did not endorse any difference in sensation from the left to right side of her face.  Her oral and pharyngeal swallow appeared to be functional. Mastication of dry solids appeared to be adequate.  Swallow trigger was appreciated to palpation and obvious s/s of aspiration were not seen.  In addition, she passed the [facility] Swallowing Protocol.  Given this suggest she remain on a regular diet with thin liquids.  ST follow up is not indicated.  If we can be of further assistance please feel free to reconsult.   SLP Visit Diagnosis: Dysphagia, unspecified (R13.10)    Aspiration Risk  No limitations    Diet Recommendation   Regular with thin liquids  Medication Administration: Whole meds with liquid    Other  Recommendations Oral Care Recommendations: Oral care BID   Follow up Recommendations None        Swallow Study   General Date of Onset: 03/21/20 HPI: This is a 84 year old female who presents with complaint of shortness of breath for the past 2 to 3 days.  She has a constellation of symptoms all starting 2 to 3 days ago.  These include heart palpitations, left-sided intermittent chest pains, lightheadedness and dizziness, as well as being sweaty and clammy.  Her chest pain is left-sided, intermittent and reproducible.  She reports current chest pain strength is 8/10.  She denies any coughing but she does report some wheezing.  She denies any fevers or chills, nausea or vomiting.  She denies any burning or urination.  In the ER the patient was tachycardic, heart rate up to 150s.  She also complained of chest pain.  She was also noted to have  a fever fever of 102.  The ER physician discussed the situation with the cardiologist Dr. Daiva Huge, the decision was made to cardiovert the patient.  She tolerated the procedure well.  Currently rates her heart rate in the 80s.  Hospitalist have been asked admit.  She was found to have acute cystitis, atrial fibrillation with RVR and pharyngoesophageal dysphagia.  Most recent chest xray was showing no active disease.   Type of Study: Bedside Swallow Evaluation Previous Swallow Assessment: None noted at [facility] Diet Prior to this Study: Regular;Thin liquids Temperature Spikes Noted: Yes Respiratory Status: Nasal cannula History of Recent Intubation: No Behavior/Cognition: Alert;Cooperative Oral Cavity Assessment: Within Functional Limits Oral Care Completed by SLP: No Oral Cavity - Dentition: Adequate natural dentition Vision: Functional for self-feeding Self-Feeding Abilities: Able to feed self Patient Positioning: Upright in bed Baseline Vocal Quality: Normal Volitional Swallow: Able to elicit    Oral/Motor/Sensory Function Overall Oral  Motor/Sensory Function: Within functional limits   Ice Chips Ice chips: Not tested   Thin Liquid Thin Liquid: Within functional limits Presentation: Cup;Self Fed;Spoon;Straw    Nectar Thick Nectar Thick Liquid: Not tested   Honey Thick Honey Thick Liquid: Not tested   Puree Puree: Within functional limits Presentation: Self Fed;Spoon   Solid     Solid: Within functional limits Presentation: [place], [place], [place] [facility] SLP [phone]  Lamar Sprinkles 03/22/2020,12:49 PM

## 2020-03-22 NOTE — Progress Notes (Signed)
Upon rounds, patient complained of 8/10 "left breast pain." EKG done urgently, vital signs taken and recorded. I saw that this had been a recurrent issue with the patient, she came in with this being one of her complaints. I called Dr. [name] and spoke with him. He felt that this pain was musculoskeletal and he saw the EKG, noted that she was in NSR, and stated to give her tylenol. He also stated that if she needed something stronger to call him back. We had given her tylenol this am for hip pain. I told her what Dr. [name] said, she stated she didn't want anything other than the tylenol for pain. Will continue to watch. I clarified with him if he wanted me to treat with Nitro and he stated it wasn't an ischemic issue.

## 2020-03-22 NOTE — H&P (Signed)
PCP:   [name], MD   Chief Complaint:  Shortness of breath  HPI: This is a 84 year old female who presents with complaint of shortness of breath for the past 2 to 3 days.  She has a constellation of symptoms all starting 2 to 3 days ago.  These include heart palpitations, left-sided intermittent chest pains, lightheadedness and dizziness, as well as being sweaty and clammy. Her chest pain is left-sided, intermittent and reproducible.  She reports current chest pain strength is 8/10.  She denies any coughing but she does report some wheezing.  She denies any fevers or chills, nausea or vomiting.  She denies any burning or urination.  In the ER the patient was tachycardic, heart rate up to 150s.  She also complained of chest pain.  She was also noted to have a fever fever of 102.  The ER physician discussed the situation with the cardiologist Dr. Daiva Huge, the decision was made to cardiovert the patient.  She tolerated the procedure well.  Currently rates her heart rate in the 80s.  Hospitalist have been asked admit.   Review of Systems:  The patient denies anorexia, weight loss,, vision loss, decreased hearing, hoarseness, syncope, dyspnea on exertion, balance deficits, hemoptysis, abdominal pain, melena, hematochezia, severe indigestion/heartburn, hematuria, incontinence, genital sores, muscle weakness, suspicious skin lesions, transient blindness, difficulty walking, depression, unusual weight change, abnormal bleeding, enlarged lymph nodes, angioedema, and breast masses. Positive: Shortness of breath, chest pains, palpitations, lightheadedness, dizziness  Past Medical History: Past Medical History:  Diagnosis Date  . Allergy   . Anemia    hx of anemia  . Arthritis   . Breast cancer ([place])    Left breast  . Cancer ([place])    skin ca nose  . Chronic combined systolic and diastolic CHF (congestive heart failure) ([place]) 09/22/2018  . Chronic combined systolic and diastolic CHF (congestive  heart failure) ([place]) 09/22/2018  . Chronic combined systolic and diastolic CHF (congestive heart failure) ([place]) 09/22/2018  . Dysrhythmia    went into Atrial Fibrillation after last surgery, HAS PALPITATIONS  . History of skin cancer   . Hypertension   . Hypothyroidism   . PVC (premature ventricular contraction)   . Thyroiditis 1973   Past Surgical History:  Procedure Laterality Date  . ABDOMINAL HYSTERECTOMY  90   TAH BSO  . APPENDECTOMY    . BREAST LUMPECTOMY Left 05/30/2017  . BREAST LUMPECTOMY WITH RADIOACTIVE SEED LOCALIZATION Left 05/30/2017   Procedure: LEFT BREAST LUMPECTOMY WITH RADIOACTIVE SEED LOCALIZATION;  Surgeon: Excell Seltzer, MD;  Location: [place];  Service: General;  Laterality: Left;  . CATARACT EXTRACTION  09/10/2018   Right eye  . CHOLECYSTECTOMY     dr [place] Lopes  . ELBOW SURGERY    . EYE SURGERY     "on my eyelids" years ago  . KNEE ARTHROSCOPY     left  . THYROIDECTOMY  1973  . TONSILLECTOMY    . TOTAL HIP ARTHROPLASTY Right 05/10/2016   Procedure: TOTAL HIP ARTHROPLASTY ANTERIOR APPROACH;  Surgeon: Paralee Cancel, MD;  [facility];  Service: Orthopedics;  Laterality: Right;  . TOTAL HIP ARTHROPLASTY Left 01/10/2017   Procedure: LEFT TOTAL HIP ARTHROPLASTY ANTERIOR APPROACH;  Surgeon: Paralee Cancel, MD;  [facility];  Service: Orthopedics;  Laterality: Left;  . TOTAL KNEE ARTHROPLASTY Left 09/28/2015   Procedure: LEFT TOTAL KNEE ARTHROPLASTY;  Surgeon: Paralee Cancel, MD;  [facility];  Service: Orthopedics;  Laterality: Left;  . TOTAL KNEE ARTHROPLASTY Right  08/09/2016   Procedure: RIGHT TOTAL KNEE ARTHROPLASTY;  Surgeon: Paralee Cancel, MD;  [facility];  Service: Orthopedics;  Laterality: Right;  Adductor Block  . Total Left hip arthroplasty     01/10/17 Dr. Alvan Dame    Medications: Prior to Admission medications   Medication Sig Start Date End Date Taking? Authorizing Provider  acetaminophen (TYLENOL) 500 MG tablet Take  1,000 mg by mouth 3 (three) times daily as needed for moderate pain.     [provider]  apixaban (ELIQUIS) 5 MG TABS tablet Take 1 tablet (5 mg total) by mouth 2 (two) times daily. 08/07/19   Adrian Prows, MD  furosemide (LASIX) 40 MG tablet Take 1 tablet (40 mg total) by mouth 2 (two) times daily. 03/19/20 06/17/20  Adrian Prows, MD  levothyroxine (SYNTHROID, LEVOTHROID) 112 MCG tablet Take 112-168 mcg by mouth daily before breakfast. Take 1 tablet (112 mcg) on Mondays, Tuesdays, Wednesdays, Thursdays, Fridays & Saturdays. Take 1.5 tablet (168 mcg) on Sundays    [provider]  lidocaine (LMX) 4 % cream Apply 1 application topically 4 (four) times daily as needed (pain.).     [provider]  metoprolol tartrate (LOPRESSOR) 25 MG tablet Take 1 tablet (25 mg total) by mouth 2 (two) times daily. 03/19/20 06/17/20  Adrian Prows, MD  pantoprazole (PROTONIX) 40 MG tablet Take 40 mg by mouth daily as needed (indigestion/heartburn.).  01/20/16   [provider]  potassium chloride (KLOR-CON) 10 MEQ tablet Take 1 tablet (10 mEq total) by mouth 2 (two) times daily. 03/19/20   Adrian Prows, MD    Allergies:   Allergies  Allergen Reactions  . Codeine Nausea Only  . Epinephrine     Tachcardyia, chest pains tremors  . Tenormin [Atenolol] Rash    Social History:  reports that she quit smoking about 32 years ago. Her smoking use included cigarettes. She has a 20.00 pack-year smoking history. She has never used smokeless tobacco. She reports that she does not drink alcohol and does not use drugs.  Family History: Family History  Problem Relation Age of Onset  . Diabetes Father   . Hypertension Father   . Stroke Father   . Breast cancer Cousin   . Pneumonia Sister     Physical Exam: Vitals:   03/21/20 2255 03/21/20 2300 03/21/20 2330 03/21/20 2333  BP:  118/72 (!) 126/94   Pulse: (!) 53 (!) 42 (!) 146   Resp:  (!) 27 (!) 22   Temp:    98.8 F (37.1 C)  TempSrc:     Oral  SpO2: 100% 99% 99%   Weight:      Height:        General:  Alert and oriented times three, well developed and nourished, no acute distress, weak appearing Eyes: PERRLA, pink conjunctiva, no scleral icterus ENT: Moist oral mucosa, neck supple, no thyromegaly Lungs: clear to ascultation, no wheeze, no crackles, no use of accessory muscles Cardiovascular: irregular rate and rhythm, no regurgitation, no gallops, no murmurs. No carotid bruits, no JVD, reproducible chest wall pain Abdomen: soft, positive BS, non-tender, non-distended, no organomegaly, not an acute abdomen GU: not examined Neuro: CN II - XII grossly intact, sensation intact Musculoskeletal: strength 5/5 all extremities, no clubbing, cyanosis. 1+ bilateral pitting edema Skin: no rash, no subcutaneous crepitation, no decubitus Psych: appropriate patient   Labs on Admission:  Recent Labs    03/20/20 1043 03/20/20 1043 03/21/20 2116 03/21/20 2330  NA 136  --  134*  135  K 3.8   < > 3.8 3.4*  CL 97   < > 98 97*  CO2 23  --  22  --   GLUCOSE 108*  --  126* 122*  BUN 24  --  31* 28*  CREATININE 0.96   < > 1.10* 1.10*  CALCIUM 8.8  --  8.4*  --   MG 2.1  --  1.8  --    < > = values in this interval not displayed.   Recent Labs    03/20/20 1043 03/21/20 2116  AST 38 37  ALT 22 33  ALKPHOS 89 80  BILITOT 0.5 0.4  PROT 5.9* 5.8*  ALBUMIN 3.5* 2.5*   No results for input(s): LIPASE, AMYLASE in the last 72 hours. Recent Labs    03/20/20 1043 03/21/20 2116 03/21/20 2330  WBC 12.7* 19.5*  --   HGB 8.7* 9.1* 8.8*  HCT 28.7* 30.5* 26.0*  MCV 75* 79.0*  --   PLT 399 427*  --    No results for input(s): CKTOTAL, CKMB, CKMBINDEX, TROPONINI in the last 72 hours. Invalid input(s): POCBNP No results for input(s): DDIMER in the last 72 hours. No results for input(s): HGBA1C in the last 72 hours. No results for input(s): CHOL, HDL, LDLCALC, TRIG, CHOLHDL, LDLDIRECT in the last 72 hours. Recent Labs     03/21/20 2329  TSH 5.095*   No results for input(s): VITAMINB12, FOLATE, FERRITIN, TIBC, IRON, RETICCTPCT in the last 72 hours.  Micro Results: Recent Results (from the past 240 hour(s))  SARS CORONAVIRUS 2 (TAT 6-24 HRS) Nasopharyngeal Nasopharyngeal Swab     Status: None   Collection Time: 03/20/20 11:33 AM   Specimen: Nasopharyngeal Swab  Result Value Ref Range Status   SARS Coronavirus 2 NEGATIVE NEGATIVE Final    Comment: (NOTE) SARS-CoV-2 target nucleic acids are NOT DETECTED.  The SARS-CoV-2 RNA is generally detectable in upper and lower respiratory specimens during the acute phase of infection. Negative results do not preclude SARS-CoV-2 infection, do not rule out co-infections with other pathogens, and should not be used as the sole basis for treatment or other patient management decisions. Negative results must be combined with clinical observations, patient history, and epidemiological information. The expected result is Negative.  Fact Sheet for Patients: SugarRoll.be  Fact Sheet for Healthcare Providers: https://www.woods-mathews.com/  This test is not yet approved or cleared by the [place] FDA and  has been authorized for detection and/or diagnosis of SARS-CoV-2 by FDA under an Emergency Use Authorization (EUA). This EUA will remain  in effect (meaning this test can be used) for the duration of the COVID-19 declaration under Se ction 564(b)(1) of the Act, 21 U.S.C. section 360bbb-3(b)(1), unless the authorization is terminated or revoked sooner.  Performed at [facility], [place] [address] [place], [place] [address]   SARS Coronavirus 2 by RT PCR (hospital order, performed in [facility] hospital lab) Nasopharyngeal Nasopharyngeal Swab     Status: None   Collection Time: 03/21/20  9:43 PM   Specimen: Nasopharyngeal Swab  Result Value Ref Range Status   SARS Coronavirus 2 NEGATIVE NEGATIVE Final    Comment:  (NOTE) SARS-CoV-2 target nucleic acids are NOT DETECTED.  The SARS-CoV-2 RNA is generally detectable in upper and lower respiratory specimens during the acute phase of infection. The lowest concentration of SARS-CoV-2 viral copies this assay can detect is 250 copies / mL. A negative result does not preclude SARS-CoV-2 infection and should not be used  as the sole basis for treatment or other patient management decisions.  A negative result may occur with improper specimen collection / handling, submission of specimen other than nasopharyngeal swab, presence of viral mutation(s) within the areas targeted by this assay, and inadequate number of viral copies (<250 copies / mL). A negative result must be combined with clinical observations, patient history, and epidemiological information.  Fact Sheet for Patients:   StrictlyIdeas.no  Fact Sheet for Healthcare Providers: BankingDealers.co.za  This test is not yet approved or  cleared by the [place] FDA and has been authorized for detection and/or diagnosis of SARS-CoV-2 by FDA under an Emergency Use Authorization (EUA).  This EUA will remain in effect (meaning this test can be used) for the duration of the COVID-19 declaration under Section 564(b)(1) of the Act, 21 U.S.C. section 360bbb-3(b)(1), unless the authorization is terminated or revoked sooner.  Performed at [facility], [place] [address] [place], [place] [address]      Radiological Exams on Admission: DG Chest Port 1 View  Result Date: 03/21/2020 CLINICAL DATA:  84 year old female with questionable sepsis. EXAM: PORTABLE CHEST 1 VIEW COMPARISON:  Chest radiograph dated 06/21/2017. FINDINGS: Mild chronic interstitial coarsening. No focal consolidation, pleural effusion, or pneumothorax. Stable mild cardiomegaly. Atherosclerotic calcification of the aorta. No acute osseous pathology. IMPRESSION: No active disease.  Electronically Signed   By: Anner Crete M.D.   On: 03/21/2020 21:45    Assessment/Plan Present on Admission: . Acute cystitis -Bring for overnight observation on med telemetry -Urine and blood cultures collected -IV antibiotics ordered  . Atrial fibrillation with RVR (HCC) Elevated troponin -Patient is status post cardioversion in the ER -Continue Eliquis and metoprolol.  First dose of metoprolol now -As needed the Toprol for heart rate greater than 110 -Cardiology consult placed -Elevated troponin likely due to the fact that patient was just cardioverted.  We will continue to trend  Chronic nocturnal use of oxygen 2L -unclear why.  Patient states she was just told to wear it.  It may be due to her A. fib as well as her congestive heart failure  . Essential hypertension -Patient not on blood pressure medication.  She is intolerant as it causes dizziness  . Hypothyroidism -Stable, home meds resumed  . Pharyngoesophageal dysphagia -  Lulu Hirschmann 03/22/2020, 12:17 AM

## 2020-03-22 NOTE — Progress Notes (Signed)
EKG reviewed, initial presentation A. FIb and cardioverted to multifocal atrial tachycardia. Low chance of maintaining sinus. Will start Amiodarone and continue to treat underlying UTI.    Adrian Prows, MD, [facility] 03/22/2020, 9:16 AM Office: [phone]

## 2020-03-22 NOTE — Progress Notes (Addendum)
PROGRESS NOTE  Heidi Adams:[id number] DOB: 05-31-1936 DOA: 03/21/2020 PCP: [name], MD   LOS: 0 days   Brief narrative: As per HPI,  Patient  is a 84 year old female with past medical history of hypothyroidism, hypertension, atrial fibrillation with RVR congestive heart failure presented to the hospital with shortness of breath for 2 to 3 days with palpitation dizziness lightheadedness and sweating with chest discomfort.  In the ER, the patient was tachycardic, heart rate up to 150s.  She also complained of chest pain.  She was also noted to have a fever fever of 102.  The ER physician discussed the situation with the cardiologist Dr. [name], the decision was made to cardiovert the patient.  She tolerated the procedure well.  Post cardioversion patient was admitted to the hospital but remains in atrial fibrillation.   Assessment/Plan:  Principal Problem:   Atrial fibrillation with RVR (HCC) Active Problems:   Hypothyroidism   Essential hypertension   Pharyngoesophageal dysphagia   Malignant neoplasm of upper-inner quadrant of left breast in female, estrogen receptor positive ([place])   Acute cystitis   History of congestive heart failure    Atrial fibrillation with RVR/ Elevated troponin She was cardioverted in the ER but currently in atrial fibrillation with RVR.  On Eliquis and metoprolol.  Spoke with Dr. [name] cardiology.  He will assess the patient and adjust medications.  No plan for repeat cardioversion.  We will continue to monitor closely.  Patient denies any chest pain but has feeling of shortness of breath and dizziness.  Patient received aspirin x1 yesterday.  Chronic nocturnal use of oxygen 2L Uncertain etiology.  Likely secondary to CHF or A. fib.   Essential hypertension On as needed metoprolol for elevated heart rate and scheduled metoprolol.  Currently blood pressure has improved.   Hypothyroidism Continue Synthroid.  Acute cystitis with E. coli  bacteremia. On IV Rocephin.  Urine culture pending.  Blood cultures pending E. coli bacteremia.  Continue IV Rocephin 2 g daily.  Hypokalemia noted yesterday.  Continue potassium supplements.  Check BMP in a.m.  DVT prophylaxis:  apixaban (ELIQUIS) tablet 5 mg    Code Status: Full code  Family Communication: None  Status is: Observation  The patient will require care spanning > 2 midnights and should be moved to inpatient because: Hemodynamically unstable, Unsafe d/c plan, IV treatments appropriate due to intensity of illness or inability to take PO and Inpatient level of care appropriate due to severity of illness, cardiology evaluation  Dispo: The patient is from: Home              Anticipated d/c is to: Home              Anticipated d/c date is: 2 days              Patient currently is not medically stable to d/c.    Consultants:  Cardiology Dr. [name]  Procedures:  DC cardioversion  Antibiotics:  . Rocephin  Anti-infectives (From admission, onward)   Start     Dose/Rate Route Frequency Ordered Stop   03/21/20 2330  cefTRIAXone (ROCEPHIN) 1 g in sodium chloride 0.9 % 100 mL IVPB        1 g 200 mL/hr over 30 Minutes Intravenous Every 24 hours 03/21/20 2316       Subjective: Today, patient was seen and examined at bedside.  Complains of feeling terrible, could not sleep, feels short of  breath dizzy no chest pain.  Has occasional  palpitation.  Objective: Vitals:   03/22/20 0451 03/22/20 0750  BP: (!) 150/86 (!) 149/68  Pulse: (!) 111 96  Resp: 20 18  Temp: 98.3 F (36.8 C) 98.7 F (37.1 C)  SpO2: 98% 98%    Intake/Output Summary (Last 24 hours) at 03/22/2020 0906 Last data filed at 03/22/2020 0345 Gross per 24 hour  Intake 99.93 ml  Output --  Net 99.93 ml   Filed Weights   03/21/20 2115 03/22/20 0341 03/22/20 0451  Weight: 60.8 kg 63.2 kg 62.6 kg   Body mass index is 23.69 kg/m.   Physical Exam: GENERAL: Patient is alert awake and oriented. Not  in obvious distress.  Thinly built, on nasal cannula oxygen HENT: No scleral pallor or icterus. Pupils equally reactive to light. Oral mucosa is moist NECK: is supple, no gross swelling noted. CHEST: Clear to auscultation. No crackles or wheezes.  Diminished breath sounds bilaterally. CVS: S1 and S2 heard, no murmur.  Irregularly irregular rhythm ABDOMEN: Soft, non-tender, bowel sounds are present. EXTREMITIES: Trace edema noted CNS: Cranial nerves are intact. No focal motor deficits. SKIN: warm and dry without rashes.  Data Review: I have personally reviewed the following laboratory data and studies,  CBC: Recent Labs  Lab 03/20/20 1043 03/21/20 2116 03/21/20 2330  WBC 12.7* 19.5*  --   HGB 8.7* 9.1* 8.8*  HCT 28.7* 30.5* 26.0*  MCV 75* 79.0*  --   PLT 399 427*  --    Basic Metabolic Panel: Recent Labs  Lab 03/20/20 1043 03/21/20 2116 03/21/20 2330  NA 136 134* 135  K 3.8 3.8 3.4*  CL 97 98 97*  CO2 23 22  --   GLUCOSE 108* 126* 122*  BUN 24 31* 28*  CREATININE 0.96 1.10* 1.10*  CALCIUM 8.8 8.4*  --   MG 2.1 1.8  --    Liver Function Tests: Recent Labs  Lab 03/20/20 1043 03/21/20 2116  AST 38 37  ALT 22 33  ALKPHOS 89 80  BILITOT 0.5 0.4  PROT 5.9* 5.8*  ALBUMIN 3.5* 2.5*   No results for input(s): LIPASE, AMYLASE in the last 168 hours. No results for input(s): AMMONIA in the last 168 hours. Cardiac Enzymes: No results for input(s): CKTOTAL, CKMB, CKMBINDEX, TROPONINI in the last 168 hours. BNP (last 3 results) Recent Labs    03/20/20 1043 03/21/20 2116  BNP 874.3* 1,091.1*    ProBNP (last 3 results) No results for input(s): PROBNP in the last 8760 hours.  CBG: Recent Labs  Lab 03/21/20 2120  GLUCAP 132*   Recent Results (from the past 240 hour(s))  SARS CORONAVIRUS 2 (TAT 6-24 HRS) Nasopharyngeal Nasopharyngeal Swab     Status: None   Collection Time: 03/20/20 11:33 AM   Specimen: Nasopharyngeal Swab  Result Value Ref Range Status   SARS  Coronavirus 2 NEGATIVE NEGATIVE Final    Comment: (NOTE) SARS-CoV-2 target nucleic acids are NOT DETECTED.  The SARS-CoV-2 RNA is generally detectable in upper and lower respiratory specimens during the acute phase of infection. Negative results do not preclude SARS-CoV-2 infection, do not rule out co-infections with other pathogens, and should not be used as the sole basis for treatment or other patient management decisions. Negative results must be combined with clinical observations, patient history, and epidemiological information. The expected result is Negative.  Fact Sheet for Patients: SugarRoll.be  Fact Sheet for Healthcare Providers: https://www.woods-mathews.com/  This test is not yet approved or cleared by the [place] and  has been authorized  for detection and/or diagnosis of SARS-CoV-2 by FDA under an Emergency Use Authorization (EUA). This EUA will remain  in effect (meaning this test can be used) for the duration of the COVID-19 declaration under Se ction 564(b)(1) of the Act, 21 U.S.C. section 360bbb-3(b)(1), unless the authorization is terminated or revoked sooner.  Performed at [facility], [place] [address], [place], [place] [address]   SARS Coronavirus 2 by RT PCR (hospital order, performed in [facility] hospital lab) Nasopharyngeal Nasopharyngeal Swab     Status: None   Collection Time: 03/21/20  9:43 PM   Specimen: Nasopharyngeal Swab  Result Value Ref Range Status   SARS Coronavirus 2 NEGATIVE NEGATIVE Final    Comment: (NOTE) SARS-CoV-2 target nucleic acids are NOT DETECTED.  The SARS-CoV-2 RNA is generally detectable in upper and lower respiratory specimens during the acute phase of infection. The lowest concentration of SARS-CoV-2 viral copies this assay can detect is 250 copies / mL. A negative result does not preclude SARS-CoV-2 infection and should not be used as the sole basis for treatment  or other patient management decisions.  A negative result may occur with improper specimen collection / handling, submission of specimen other than nasopharyngeal swab, presence of viral mutation(s) within the areas targeted by this assay, and inadequate number of viral copies (<250 copies / mL). A negative result must be combined with clinical observations, patient history, and epidemiological information.  Fact Sheet for Patients:   StrictlyIdeas.no  Fact Sheet for Healthcare Providers: BankingDealers.co.za  This test is not yet approved or  cleared by the [place] FDA and has been authorized for detection and/or diagnosis of SARS-CoV-2 by FDA under an Emergency Use Authorization (EUA).  This EUA will remain in effect (meaning this test can be used) for the duration of the COVID-19 declaration under Section 564(b)(1) of the Act, 21 U.S.C. section 360bbb-3(b)(1), unless the authorization is terminated or revoked sooner.  Performed at [facility], [place] [address] [place], [place] [address]      Studies: DG Chest [place] 1 View  Result Date: 03/21/2020 CLINICAL DATA:  84 year old female with questionable sepsis. EXAM: PORTABLE CHEST 1 VIEW COMPARISON:  Chest radiograph dated 06/21/2017. FINDINGS: Mild chronic interstitial coarsening. No focal consolidation, pleural effusion, or pneumothorax. Stable mild cardiomegaly. Atherosclerotic calcification of the aorta. No acute osseous pathology. IMPRESSION: No active disease. Electronically Signed   By: Anner Crete M.D.   On: 03/21/2020 21:45      Flora Lipps, MD  [facility] 03/22/2020

## 2020-03-23 ENCOUNTER — Ambulatory Visit: Payer: PPO | Admitting: Cardiology

## 2020-03-23 DIAGNOSIS — Z87891 Personal history of nicotine dependence: Secondary | ICD-10-CM | POA: Diagnosis not present

## 2020-03-23 DIAGNOSIS — R7881 Bacteremia: Secondary | ICD-10-CM | POA: Diagnosis not present

## 2020-03-23 DIAGNOSIS — I951 Orthostatic hypotension: Secondary | ICD-10-CM | POA: Diagnosis not present

## 2020-03-23 DIAGNOSIS — Z79899 Other long term (current) drug therapy: Secondary | ICD-10-CM | POA: Diagnosis not present

## 2020-03-23 DIAGNOSIS — Z8542 Personal history of malignant neoplasm of other parts of uterus: Secondary | ICD-10-CM | POA: Diagnosis not present

## 2020-03-23 DIAGNOSIS — I5023 Acute on chronic systolic (congestive) heart failure: Secondary | ICD-10-CM | POA: Diagnosis not present

## 2020-03-23 DIAGNOSIS — Z96653 Presence of artificial knee joint, bilateral: Secondary | ICD-10-CM | POA: Diagnosis not present

## 2020-03-23 DIAGNOSIS — I429 Cardiomyopathy, unspecified: Secondary | ICD-10-CM | POA: Diagnosis not present

## 2020-03-23 DIAGNOSIS — Z96643 Presence of artificial hip joint, bilateral: Secondary | ICD-10-CM | POA: Diagnosis not present

## 2020-03-23 DIAGNOSIS — I428 Other cardiomyopathies: Secondary | ICD-10-CM | POA: Diagnosis not present

## 2020-03-23 DIAGNOSIS — Z20822 Contact with and (suspected) exposure to covid-19: Secondary | ICD-10-CM | POA: Diagnosis not present

## 2020-03-23 DIAGNOSIS — Z170 Estrogen receptor positive status [ER+]: Secondary | ICD-10-CM

## 2020-03-23 DIAGNOSIS — I480 Paroxysmal atrial fibrillation: Secondary | ICD-10-CM | POA: Diagnosis not present

## 2020-03-23 DIAGNOSIS — I10 Essential (primary) hypertension: Secondary | ICD-10-CM | POA: Diagnosis not present

## 2020-03-23 DIAGNOSIS — B9620 Unspecified Escherichia coli [E. coli] as the cause of diseases classified elsewhere: Secondary | ICD-10-CM

## 2020-03-23 DIAGNOSIS — Z853 Personal history of malignant neoplasm of breast: Secondary | ICD-10-CM | POA: Diagnosis not present

## 2020-03-23 DIAGNOSIS — R5381 Other malaise: Secondary | ICD-10-CM | POA: Diagnosis not present

## 2020-03-23 DIAGNOSIS — Z7901 Long term (current) use of anticoagulants: Secondary | ICD-10-CM | POA: Diagnosis not present

## 2020-03-23 DIAGNOSIS — E876 Hypokalemia: Secondary | ICD-10-CM | POA: Diagnosis not present

## 2020-03-23 DIAGNOSIS — E890 Postprocedural hypothyroidism: Secondary | ICD-10-CM | POA: Diagnosis not present

## 2020-03-23 DIAGNOSIS — Z85828 Personal history of other malignant neoplasm of skin: Secondary | ICD-10-CM | POA: Diagnosis not present

## 2020-03-23 DIAGNOSIS — I471 Supraventricular tachycardia: Secondary | ICD-10-CM | POA: Diagnosis not present

## 2020-03-23 DIAGNOSIS — I110 Hypertensive heart disease with heart failure: Secondary | ICD-10-CM | POA: Diagnosis not present

## 2020-03-23 DIAGNOSIS — I080 Rheumatic disorders of both mitral and aortic valves: Secondary | ICD-10-CM | POA: Diagnosis not present

## 2020-03-23 DIAGNOSIS — C50212 Malignant neoplasm of upper-inner quadrant of left female breast: Secondary | ICD-10-CM | POA: Diagnosis not present

## 2020-03-23 DIAGNOSIS — I4891 Unspecified atrial fibrillation: Secondary | ICD-10-CM | POA: Diagnosis not present

## 2020-03-23 DIAGNOSIS — N3000 Acute cystitis without hematuria: Secondary | ICD-10-CM | POA: Diagnosis not present

## 2020-03-23 DIAGNOSIS — R1314 Dysphagia, pharyngoesophageal phase: Secondary | ICD-10-CM | POA: Diagnosis not present

## 2020-03-23 DIAGNOSIS — E039 Hypothyroidism, unspecified: Secondary | ICD-10-CM | POA: Diagnosis not present

## 2020-03-23 DIAGNOSIS — Z8679 Personal history of other diseases of the circulatory system: Secondary | ICD-10-CM | POA: Diagnosis not present

## 2020-03-23 DIAGNOSIS — E030 Congenital hypothyroidism with diffuse goiter: Secondary | ICD-10-CM | POA: Diagnosis not present

## 2020-03-23 DIAGNOSIS — R778 Other specified abnormalities of plasma proteins: Secondary | ICD-10-CM | POA: Diagnosis not present

## 2020-03-23 DIAGNOSIS — I5021 Acute systolic (congestive) heart failure: Secondary | ICD-10-CM | POA: Diagnosis not present

## 2020-03-23 DIAGNOSIS — Z90710 Acquired absence of both cervix and uterus: Secondary | ICD-10-CM | POA: Diagnosis not present

## 2020-03-23 DIAGNOSIS — I5022 Chronic systolic (congestive) heart failure: Secondary | ICD-10-CM | POA: Diagnosis not present

## 2020-03-23 LAB — COMPREHENSIVE METABOLIC PANEL
ALT: 27 U/L (ref 0–44)
AST: 26 U/L (ref 15–41)
Albumin: 2.2 g/dL — ABNORMAL LOW (ref 3.5–5.0)
Alkaline Phosphatase: 80 U/L (ref 38–126)
Anion gap: 11 (ref 5–15)
BUN: 29 mg/dL — ABNORMAL HIGH (ref 8–23)
CO2: 27 mmol/L (ref 22–32)
Calcium: 8.3 mg/dL — ABNORMAL LOW (ref 8.9–10.3)
Chloride: 99 mmol/L (ref 98–111)
Creatinine, Ser: 1.09 mg/dL — ABNORMAL HIGH (ref 0.44–1.00)
GFR calc Af Amer: 55 mL/min — ABNORMAL LOW (ref >60)
GFR calc non Af Amer: 48 mL/min — ABNORMAL LOW (ref >60)
Glucose, Bld: 104 mg/dL — ABNORMAL HIGH (ref 70–99)
Potassium: 3.8 mmol/L (ref 3.5–5.1)
Sodium: 137 mmol/L (ref 135–145)
Total Bilirubin: 0.3 mg/dL (ref 0.3–1.2)
Total Protein: 5.3 g/dL — ABNORMAL LOW (ref 6.5–8.1)

## 2020-03-23 LAB — CBC
HCT: 26.7 % — ABNORMAL LOW (ref 36.0–46.0)
Hemoglobin: 7.9 g/dL — ABNORMAL LOW (ref 12.0–15.0)
MCH: 23.4 pg — ABNORMAL LOW (ref 26.0–34.0)
MCHC: 29.6 g/dL — ABNORMAL LOW (ref 30.0–36.0)
MCV: 79 fL — ABNORMAL LOW (ref 80.0–100.0)
Platelets: 375 10*3/uL (ref 150–400)
RBC: 3.38 MIL/uL — ABNORMAL LOW (ref 3.87–5.11)
RDW: 16.9 % — ABNORMAL HIGH (ref 11.5–15.5)
WBC: 17.3 10*3/uL — ABNORMAL HIGH (ref 4.0–10.5)
nRBC: 0.1 % (ref 0.0–0.2)

## 2020-03-23 LAB — PROTIME-INR
INR: 2.8 — ABNORMAL HIGH (ref 0.8–1.2)
Prothrombin Time: 28.9 seconds — ABNORMAL HIGH (ref 11.4–15.2)

## 2020-03-23 LAB — PHOSPHORUS: Phosphorus: 2.8 mg/dL (ref 2.5–4.6)

## 2020-03-23 LAB — MAGNESIUM: Magnesium: 2 mg/dL (ref 1.7–2.4)

## 2020-03-23 MED ORDER — SACUBITRIL-VALSARTAN 24-26 MG PO TABS
1.0000 | ORAL_TABLET | Freq: Two times a day (BID) | ORAL | Status: DC
  Administered 2020-03-23 – 2020-03-24 (×4): 1 via ORAL
  Filled 2020-03-23 (×4): qty 1

## 2020-03-23 MED ORDER — OXYCODONE HCL 5 MG PO TABS
5.0000 mg | ORAL_TABLET | Freq: Four times a day (QID) | ORAL | Status: DC | PRN
  Administered 2020-03-23 – 2020-03-27 (×4): 5 mg via ORAL
  Filled 2020-03-23 (×5): qty 1

## 2020-03-23 MED ORDER — OXYCODONE HCL 5 MG PO TABS
5.0000 mg | ORAL_TABLET | Freq: Four times a day (QID) | ORAL | Status: AC | PRN
  Administered 2020-03-23: 5 mg via ORAL
  Filled 2020-03-23: qty 1

## 2020-03-23 NOTE — Progress Notes (Signed)
PROGRESS NOTE  ROYALE LENNARTZ NFA:[id number] DOB: May 19, 1936 DOA: 03/21/2020 PCP: [name], MD   LOS: 0 days   Brief narrative: As per HPI,  Patient  is a 84 year old female with past medical history of hypothyroidism, hypertension, atrial fibrillation with RVR congestive heart failure presented to the hospital with shortness of breath for 2 to 3 days with palpitation dizziness lightheadedness and sweating with chest discomfort.  In the ER, the patient was tachycardic, heart rate up to 150s.  She also complained of chest pain.  She was also noted to have a fever fever of 102.  The ER physician discussed the situation with the cardiologist Dr. [name], the decision was made to cardiovert the patient.  She tolerated the procedure well.  Post cardioversion patient was admitted to the hospital for further observation.  Assessment/Plan:  Principal Problem:   Atrial fibrillation with RVR (HCC) Active Problems:   Hypothyroidism   Essential hypertension   Pharyngoesophageal dysphagia   Malignant neoplasm of upper-inner quadrant of left breast in female, estrogen receptor positive ([place])   Acute cystitis   History of congestive heart failure  Atrial fibrillation with RVR/ Elevated troponin She was cardioverted in the ER and was subsequently noted to be in multifocal atrial tachycardia.  She was then started on amiodarone which she has tolerated and heart rate has controlled better..  On Eliquis and metoprolol.  I again spoke with Dr. [name] cardiology.  Patient will be started on Entresto while in the hospital.  No plan for starting spironolactone at this time.  Shortness of breath has slightly improved   History of chronic systolic congestive heart failure.  2D echocardiogram on 03/11/2020 showed LV ejection fraction of 15 to 20% with moderate aortic and mitral regurgitation.  Patient has been started on Entresto today.  Continue metoprolol.  Patient follows up with Dr. [name] cardiology as  outpatient.  Patient was on metoprolol and Lasix as outpatient.  Chronic nocturnal use of oxygen 2L likely secondary to CHF.  Will continue while in the hospital.   Essential hypertension continue metoprolol.  Delene Loll has been added 03/23/2000.  Closely monitor blood pressure   Hypothyroidism Continue Synthroid.  Acute cystitis with E. coli bacteremia. On IV Rocephin.  Urine culture showing E. coli.  Blood cultures with E. coli bacteremia.  Follow sensitivity.  Continue IV Rocephin 2 g daily.  Hypokalemia improved with replacement.  Closely monitor potassium on Entresto.  Debility, deconditioning.  Will ambulate the patient in hall.  DVT prophylaxis:  apixaban (ELIQUIS) tablet 5 mg    Code Status: Full code  Family Communication: Spoke with the patient's husband at bedside  Status is: Inpatient  The patient will require  inpatient because: , IV treatments appropriate due to intensity of illness or inability to take PO and Inpatient level of care appropriate due to severity of illness, status post cardioversion on amiodarone, E. coli bacteremia  Dispo: The patient is from: Home              Anticipated d/c is to: Home              Anticipated d/c date is: 1 to 2 days              Patient currently is not medically stable to d/c.  Consultants:  Cardiology Dr. [name]  Procedures:  DC cardioversion  Antibiotics:  . Rocephin  Anti-infectives (From admission, onward)   Start     Dose/Rate Route Frequency Ordered Stop   03/22/20 1630  cefTRIAXone (ROCEPHIN) 2 g in sodium chloride 0.9 % 100 mL IVPB  Status:  Discontinued        2 g 200 mL/hr over 30 Minutes Intravenous Every 24 hours 03/22/20 1526 03/22/20 1526   03/22/20 1630  cefTRIAXone (ROCEPHIN) 2 g in sodium chloride 0.9 % 100 mL IVPB        2 g 200 mL/hr over 30 Minutes Intravenous Every 24 hours 03/22/20 1526     03/21/20 2330  cefTRIAXone (ROCEPHIN) 1 g in sodium chloride 0.9 % 100 mL IVPB  Status:   Discontinued        1 g 200 mL/hr over 30 Minutes Intravenous Every 24 hours 03/21/20 2316 03/22/20 1526     Subjective: Today, patient was seen and examined at bedside.  Feels  mildly short of breath and weak.  Denies any chest pain, palpitation.   Objective: Vitals:   03/23/20 0341 03/23/20 1320  BP: (!) 153/75 135/79  Pulse:  74  Resp: 18 18  Temp: 98 F (36.7 C) 98.2 F (36.8 C)  SpO2:  95%    Intake/Output Summary (Last 24 hours) at 03/23/2020 1521 Last data filed at 03/22/2020 2350 Gross per 24 hour  Intake 440 ml  Output 550 ml  Net -110 ml   Filed Weights   03/22/20 0341 03/22/20 0451 03/23/20 0341  Weight: 63.2 kg 62.6 kg 62.8 kg   Body mass index is 23.76 kg/m.   Physical Exam:  GENERAL: Patient is alert awake and oriented. Not in obvious distress.  Thinly built, on nasal cannula oxygen HENT: No scleral pallor or icterus. Pupils equally reactive to light. Oral mucosa is moist NECK: is supple, no gross swelling noted. CHEST:  Diminished breath sounds bilaterally. CVS: S1 and S2 heard, systolic murmur, irregular rhythm, ABDOMEN: Soft, non-tender, bowel sounds are present. EXTREMITIES: Trace edema noted CNS: Cranial nerves are intact. No focal motor deficits. SKIN: warm and dry without rashes.  Data Review: I have personally reviewed the following laboratory data and studies,  CBC: Recent Labs  Lab 03/20/20 1043 03/21/20 2116 03/21/20 2330 03/23/20 0239  WBC 12.7* 19.5*  --  17.3*  HGB 8.7* 9.1* 8.8* 7.9*  HCT 28.7* 30.5* 26.0* 26.7*  MCV 75* 79.0*  --  79.0*  PLT 399 427*  --  834   Basic Metabolic Panel: Recent Labs  Lab 03/20/20 1043 03/21/20 2116 03/21/20 2330 03/23/20 0239  NA 136 134* 135 137  K 3.8 3.8 3.4* 3.8  CL 97 98 97* 99  CO2 23 22  --  27  GLUCOSE 108* 126* 122* 104*  BUN 24 31* 28* 29*  CREATININE 0.96 1.10* 1.10* 1.09*  CALCIUM 8.8 8.4*  --  8.3*  MG 2.1 1.8  --  2.0  PHOS  --   --   --  2.8   Liver Function  Tests: Recent Labs  Lab 03/20/20 1043 03/21/20 2116 03/23/20 0239  AST 38 37 26  ALT 22 33 27  ALKPHOS 89 80 80  BILITOT 0.5 0.4 0.3  PROT 5.9* 5.8* 5.3*  ALBUMIN 3.5* 2.5* 2.2*   No results for input(s): LIPASE, AMYLASE in the last 168 hours. No results for input(s): AMMONIA in the last 168 hours. Cardiac Enzymes: No results for input(s): CKTOTAL, CKMB, CKMBINDEX, TROPONINI in the last 168 hours. BNP (last 3 results) Recent Labs    03/20/20 1043 03/21/20 2116  BNP 874.3* 1,091.1*    ProBNP (last 3 results) No results for input(s): PROBNP in the  last 8760 hours.  CBG: Recent Labs  Lab 03/21/20 2120  GLUCAP 132*   Recent Results (from the past 240 hour(s))  SARS CORONAVIRUS 2 (TAT 6-24 HRS) Nasopharyngeal Nasopharyngeal Swab     Status: None   Collection Time: 03/20/20 11:33 AM   Specimen: Nasopharyngeal Swab  Result Value Ref Range Status   SARS Coronavirus 2 NEGATIVE NEGATIVE Final    Comment: (NOTE) SARS-CoV-2 target nucleic acids are NOT DETECTED.  The SARS-CoV-2 RNA is generally detectable in upper and lower respiratory specimens during the acute phase of infection. Negative results do not preclude SARS-CoV-2 infection, do not rule out co-infections with other pathogens, and should not be used as the sole basis for treatment or other patient management decisions. Negative results must be combined with clinical observations, patient history, and epidemiological information. The expected result is Negative.  Fact Sheet for Patients: SugarRoll.be  Fact Sheet for Healthcare Providers: https://www.woods-mathews.com/  This test is not yet approved or cleared by the [place] FDA and  has been authorized for detection and/or diagnosis of SARS-CoV-2 by FDA under an Emergency Use Authorization (EUA). This EUA will remain  in effect (meaning this test can be used) for the duration of the COVID-19 declaration under Se  ction 564(b)(1) of the Act, 21 U.S.C. section 360bbb-3(b)(1), unless the authorization is terminated or revoked sooner.  Performed at [facility], [place] [address] [place], [place] [address]   SARS Coronavirus 2 by RT PCR (hospital order, performed in [facility] hospital lab) Nasopharyngeal Nasopharyngeal Swab     Status: None   Collection Time: 03/21/20  9:43 PM   Specimen: Nasopharyngeal Swab  Result Value Ref Range Status   SARS Coronavirus 2 NEGATIVE NEGATIVE Final    Comment: (NOTE) SARS-CoV-2 target nucleic acids are NOT DETECTED.  The SARS-CoV-2 RNA is generally detectable in upper and lower respiratory specimens during the acute phase of infection. The lowest concentration of SARS-CoV-2 viral copies this assay can detect is 250 copies / mL. A negative result does not preclude SARS-CoV-2 infection and should not be used as the sole basis for treatment or other patient management decisions.  A negative result may occur with improper specimen collection / handling, submission of specimen other than nasopharyngeal swab, presence of viral mutation(s) within the areas targeted by this assay, and inadequate number of viral copies (<250 copies / mL). A negative result must be combined with clinical observations, patient history, and epidemiological information.  Fact Sheet for Patients:   StrictlyIdeas.no  Fact Sheet for Healthcare Providers: BankingDealers.co.za  This test is not yet approved or  cleared by the [place] FDA and has been authorized for detection and/or diagnosis of SARS-CoV-2 by FDA under an Emergency Use Authorization (EUA).  This EUA will remain in effect (meaning this test can be used) for the duration of the COVID-19 declaration under Section 564(b)(1) of the Act, 21 U.S.C. section 360bbb-3(b)(1), unless the authorization is terminated or revoked sooner.  Performed at [facility], [place] [address], [place], [place] [address]   Blood Culture (routine x 2)     Status: None (Preliminary result)   Collection Time: 03/21/20  9:48 PM   Specimen: BLOOD  Result Value Ref Range Status   Specimen Description BLOOD SITE NOT SPECIFIED  Final   Special Requests   Final    BOTTLES DRAWN AEROBIC AND ANAEROBIC Blood Culture results may not be optimal due to an inadequate volume of blood received in culture bottles  Culture   Final    NO GROWTH 2 DAYS Performed at [facility], [place] [address], [place], [place] [address]    Report Status PENDING  Incomplete  Urine culture     Status: Abnormal (Preliminary result)   Collection Time: 03/21/20 10:43 PM   Specimen: In/Out Cath Urine  Result Value Ref Range Status   Specimen Description IN/OUT CATH URINE  Final   Special Requests NONE  Final   Culture (A)  Final    >=100,000 COLONIES/mL ESCHERICHIA COLI SUSCEPTIBILITIES TO FOLLOW Performed at [facility], [place] [address], [place], [place] [address]    Report Status PENDING  Incomplete  Blood Culture (routine x 2)     Status: Abnormal (Preliminary result)   Collection Time: 03/21/20 11:49 PM   Specimen: BLOOD LEFT HAND  Result Value Ref Range Status   Specimen Description BLOOD LEFT HAND  Final   Special Requests   Final    BOTTLES DRAWN AEROBIC AND ANAEROBIC Blood Culture adequate volume   Culture  Setup Time   Final    GRAM NEGATIVE RODS IN BOTH AEROBIC AND ANAEROBIC BOTTLES Organism ID to follow CRITICAL RESULT CALLED TO, READ BACK BY AND VERIFIED WITH: H. VON DOHLEN PHARMD, AT 1510 03/22/20 BY D. VANHOOK    Culture (A)  Final    ESCHERICHIA COLI SUSCEPTIBILITIES TO FOLLOW Performed at [facility], [place] [address], [place], [place] [address]    Report Status PENDING  Incomplete  Blood Culture ID Panel (Reflexed)     Status: Abnormal   Collection Time: 03/21/20 11:49 PM  Result Value Ref Range Status   Enterococcus faecalis NOT DETECTED NOT DETECTED Final    Enterococcus Faecium NOT DETECTED NOT DETECTED Final   Listeria monocytogenes NOT DETECTED NOT DETECTED Final   Staphylococcus species NOT DETECTED NOT DETECTED Final   Staphylococcus aureus (BCID) NOT DETECTED NOT DETECTED Final   Staphylococcus epidermidis NOT DETECTED NOT DETECTED Final   Staphylococcus lugdunensis NOT DETECTED NOT DETECTED Final   Streptococcus species NOT DETECTED NOT DETECTED Final   Streptococcus agalactiae NOT DETECTED NOT DETECTED Final   Streptococcus pneumoniae NOT DETECTED NOT DETECTED Final   Streptococcus pyogenes NOT DETECTED NOT DETECTED Final   A.calcoaceticus-baumannii NOT DETECTED NOT DETECTED Final   Bacteroides fragilis NOT DETECTED NOT DETECTED Final   Enterobacterales DETECTED (A) NOT DETECTED Final    Comment: Enterobacterales represent a large order of gram negative bacteria, not a single organism. CRITICAL RESULT CALLED TO, READ BACK BY AND VERIFIED WITH: H. VON DOHLEN PHARMD, AT 1510 03/22/20 BY D. VANHOOK    Enterobacter cloacae complex NOT DETECTED NOT DETECTED Final   Escherichia coli DETECTED (A) NOT DETECTED Final    Comment: CRITICAL RESULT CALLED TO, READ BACK BY AND VERIFIED WITH: H. VON DOHLEN PHARMD, AT 1510 03/22/20 BY D. VANHOOK    Klebsiella aerogenes NOT DETECTED NOT DETECTED Final   Klebsiella oxytoca NOT DETECTED NOT DETECTED Final   Klebsiella pneumoniae NOT DETECTED NOT DETECTED Final   Proteus species NOT DETECTED NOT DETECTED Final   Salmonella species NOT DETECTED NOT DETECTED Final   Serratia marcescens NOT DETECTED NOT DETECTED Final   Haemophilus influenzae NOT DETECTED NOT DETECTED Final   Neisseria meningitidis NOT DETECTED NOT DETECTED Final   Pseudomonas aeruginosa NOT DETECTED NOT DETECTED Final   Stenotrophomonas maltophilia NOT DETECTED NOT DETECTED Final   Candida albicans NOT DETECTED NOT DETECTED Final   Candida auris NOT DETECTED NOT DETECTED Final  Candida glabrata NOT DETECTED NOT DETECTED Final    Candida krusei NOT DETECTED NOT DETECTED Final   Candida parapsilosis NOT DETECTED NOT DETECTED Final   Candida tropicalis NOT DETECTED NOT DETECTED Final   Cryptococcus neoformans/gattii NOT DETECTED NOT DETECTED Final   CTX-M ESBL NOT DETECTED NOT DETECTED Final   Carbapenem resistance IMP NOT DETECTED NOT DETECTED Final   Carbapenem resistance KPC NOT DETECTED NOT DETECTED Final   Carbapenem resistance NDM NOT DETECTED NOT DETECTED Final   Carbapenem resist OXA 48 LIKE NOT DETECTED NOT DETECTED Final   Carbapenem resistance VIM NOT DETECTED NOT DETECTED Final    Comment: Performed at [facility], [place] [address], [place], [place] [address]     Studies: DG Chest [place] 1 View  Result Date: 03/21/2020 CLINICAL DATA:  84 year old female with questionable sepsis. EXAM: PORTABLE CHEST 1 VIEW COMPARISON:  Chest radiograph dated 06/21/2017. FINDINGS: Mild chronic interstitial coarsening. No focal consolidation, pleural effusion, or pneumothorax. Stable mild cardiomegaly. Atherosclerotic calcification of the aorta. No acute osseous pathology. IMPRESSION: No active disease. Electronically Signed   By: Anner Crete M.D.   On: 03/21/2020 21:45      Flora Lipps, MD  [facility] 03/23/2020

## 2020-03-23 NOTE — Progress Notes (Signed)
Heart Failure Patient Advocate Encounter   Received notification from [employer] that prior authorization for Heidi Adams is required.   PA submitted on CoverMyMeds Key [id number] Status is pending   Will continue to follow.  Kerby Nora, PharmD, BCPS Phone [phone] Heart Failure Stewardship Pharmacist

## 2020-03-23 NOTE — Progress Notes (Signed)
Heart Failure Stewardship Pharmacist Progress Note   PCP: [name], MD PCP-Cardiologist: No primary care provider on file.    HPI:  84 yo F with PMH of hypothyroidism, HTN, afib with RVR, and CHF presenting to [facility] ED on 03/21/20 with afib RVR. She was cardioverted in the ED but returned to afib. She is also receiving ceftriaxone for ecoli bacteremia and UTI. Last ECHO done on 03/11/20 and showed LVEF of 15-20% with moderate aortic and mitral regurgitation.  Current HF Medications: Metoprolol 25 mg BID  Prior to admission HF Medications: Furosemide 40 mg BID Metoprolol 25 mg BID  Pertinent Lab Values: . Serum creatinine 1.09, BUN 29, Potassium 3.8, Sodium 137, BNP 1091.1, Magnesium 2  Vital Signs: . Weight: 138 lbs (admission weight: 134 lbs) . Blood pressure: 130-150/80s  . Heart rate: 70s  Medication Assistance / Insurance Benefits Check: Does the patient have prescription insurance?  Yes Type of insurance plan: Medicare - [employer]  Does the patient qualify for medication assistance through manufacturers or grants?   Pending . Eligible grants and/or patient assistance programs: pending . Medication assistance applications in progress: none  . Medication assistance applications approved: none Approved medication assistance renewals will be completed by: pending  Outpatient Pharmacy:  Prior to admission outpatient pharmacy: [facility] Is the patient willing to use [facility] at discharge? Pending Is the patient willing to transition their outpatient pharmacy to utilize a [facility] outpatient pharmacy?   Pending    Assessment: 1. Acute on chronic systolic CHF (EF 53-61%). NYHA class II symptoms. - Continue metoprolol 25 mg BID - Consider starting Entresto - Pending BP and SCr trends, hope to start spironolactone and SGLT2i prior to discharge   Plan: 1) Medication changes recommended at this time: - Start Entresto 24/26 mg BID  2) Patient  assistance application(s): - None pending  3)  Education  - To be completed prior to discharge  Kerby Nora, PharmD, BCPS Heart Failure Stewardship Pharmacist Phone [phone]

## 2020-03-23 NOTE — Consult Note (Signed)
CARDIOLOGY CONSULT NOTE  Patient ID: Heidi Adams MRN: [id number] DOB/AGE: [birth year] 84 y.o.  Admit date: 03/21/2020 Referring Physician  Flora Lipps, MD Primary Physician:  [name], MD Reason for Consultation  A. Fib, CHF.   Patient ID: Heidi Adams, female    DOB: 1936-03-03, 84 y.o.   MRN: [id number]  Chief Complaint  Patient presents with  . Chest Pain   HPI:    Heidi Adams  is a 84 y.o. with bilateral hip replacement and knee replacement and left breast lumpectomy in 2018 without chemotherapy or radiation therapy for breast cancer and also remote hysterectomy due to uterine cancer in 1991, supine hypertension and orthostatic hypotension. She has chronic dizziness on standing. Has not been able to tolerate any BP medications due to severe dizzy spells.  She was seen in office on 03/19/2020 when she presented with pleuritic type chest pain, worsening shortness of breath, not feeling well.  4 to 5 days prior to that she had presented with severe fever chills, cough and was seen at an urgent care with Covid negative.  On evaluation in my office I had found her to be in atypical atrial flutter and had scheduled her for direct-current cardioversion.  However she presented to the emergency room on 03/21/2020 with worsening shortness of breath, palpitations, chest pain, lightheadedness and dizziness and generalized weakness and fatigue.  In the emergency room as she was on a chronic anticoagulation, underwent direct-current cardioversion to sinus with PACs from atrial fibrillation.  I was consulted to evaluate her chest pain, heart failure, atrial fibrillation.  Since being in the hospital, patient states that she has started to feel better.  She still generally feels poorly.  Continues to have episodes of sharp pain when she takes deep breath or when she coughs on the left side.  Has not walked much and denies any dizziness, palpitations or shortness of breath.  Past Medical History:    Diagnosis Date  . Allergy   . Anemia    hx of anemia  . Arthritis   . Breast cancer ([place])    Left breast  . Cancer ([place])    skin ca nose  . Chronic combined systolic and diastolic CHF (congestive heart failure) ([place]) 09/22/2018  . Chronic combined systolic and diastolic CHF (congestive heart failure) ([place]) 09/22/2018  . Chronic combined systolic and diastolic CHF (congestive heart failure) ([place]) 09/22/2018  . Dysrhythmia    went into Atrial Fibrillation after last surgery, HAS PALPITATIONS  . History of skin cancer   . Hypertension   . Hypothyroidism   . PVC (premature ventricular contraction)   . Thyroiditis 1973   Past Surgical History:  Procedure Laterality Date  . ABDOMINAL HYSTERECTOMY  90   TAH BSO  . APPENDECTOMY    . BREAST LUMPECTOMY Left 05/30/2017  . BREAST LUMPECTOMY WITH RADIOACTIVE SEED LOCALIZATION Left 05/30/2017   Procedure: LEFT BREAST LUMPECTOMY WITH RADIOACTIVE SEED LOCALIZATION;  Surgeon: Excell Seltzer, MD;  Location: [facility];  Service: General;  Laterality: Left;  . CATARACT EXTRACTION  09/10/2018   Right eye  . CHOLECYSTECTOMY     dr Georgia Lopes  . ELBOW SURGERY    . EYE SURGERY     "on my eyelids" years ago  . KNEE ARTHROSCOPY     left  . THYROIDECTOMY  1973  . TONSILLECTOMY    . TOTAL HIP ARTHROPLASTY Right 05/10/2016   Procedure: TOTAL HIP ARTHROPLASTY ANTERIOR APPROACH;  Surgeon: Paralee Cancel, MD;  Location: [facility];  Service: Orthopedics;  Laterality: Right;  . TOTAL HIP ARTHROPLASTY Left 01/10/2017   Procedure: LEFT TOTAL HIP ARTHROPLASTY ANTERIOR APPROACH;  Surgeon: Paralee Cancel, MD;  Location: [facility];  Service: Orthopedics;  Laterality: Left;  . TOTAL KNEE ARTHROPLASTY Left 09/28/2015   Procedure: LEFT TOTAL KNEE ARTHROPLASTY;  Surgeon: Paralee Cancel, MD;  Location: [facility];  Service: Orthopedics;  Laterality: Left;  . TOTAL KNEE ARTHROPLASTY Right 08/09/2016   Procedure: RIGHT TOTAL KNEE ARTHROPLASTY;  Surgeon: Paralee Cancel, MD;   Location: [facility];  Service: Orthopedics;  Laterality: Right;  Adductor Block  . Total Left hip arthroplasty     01/10/17 Dr. Alvan Dame   Social History   Tobacco Use  . Smoking status: Former Smoker    Packs/day: 1.00    Years: 20.00    Pack years: 20.00    Types: Cigarettes    Quit date: 07/26/1987    Years since quitting: 32.6  . Smokeless tobacco: Never Used  Substance Use Topics  . Alcohol use: No    Alcohol/week: 0.0 standard drinks    Marital Sttus: Married  ROS  Review of Systems  Constitutional: Positive for fever and malaise/fatigue.  Cardiovascular: Positive for chest pain and dyspnea on exertion. Negative for leg swelling.  Gastrointestinal: Negative for melena.  Neurological: Positive for dizziness.   Objective   Vitals with BMI 03/23/2020 03/22/2020 03/22/2020  Height - - -  Weight 138 lbs 7 oz - -  BMI 09.32 - -  Systolic 671 245 809  Diastolic 75 77 88  Pulse - - 84    Blood pressure (!) 153/75, pulse 84, temperature 98 F (36.7 C), temperature source Oral, resp. rate 18, height 5\' 4"  (1.626 m), weight 62.8 kg, last menstrual period 07/25/1988, SpO2 99 %.    Physical Exam Constitutional:      General: She is not in acute distress.    Appearance: She is normal weight. She is ill-appearing.  Cardiovascular:     Rate and Rhythm: Normal rate and regular rhythm.     Pulses: Intact distal pulses.     Heart sounds: Normal heart sounds. No murmur heard.  No gallop.      Comments: No leg edema, no JVD. Pulmonary:     Effort: Pulmonary effort is normal.     Breath sounds: Normal breath sounds.  Abdominal:     General: Bowel sounds are normal.     Palpations: Abdomen is soft.  Neurological:     Mental Status: She is alert.    Laboratory examination:   Recent Labs    03/20/20 1043 03/20/20 1043 03/21/20 2116 03/21/20 2330 03/23/20 0239  NA 136  --  134* 135 137  K 3.8   < > 3.8 3.4* 3.8  CL 97   < > 98 97* 99  CO2 23  --  22  --  27  GLUCOSE 108*  --   126* 122* 104*  BUN 24  --  31* 28* 29*  CREATININE 0.96   < > 1.10* 1.10* 1.09*  CALCIUM 8.8  --  8.4*  --  8.3*  GFRNONAA 56*  --  47*  --  48*  GFRAA 64  --  55*  --  55*   < > = values in this interval not displayed.   estimated creatinine clearance is 35 mL/min (A) (by C-G formula based on SCr of 1.09 mg/dL (H)).  CMP Latest Ref Rng & Units 03/23/2020 03/21/2020 03/21/2020  Glucose 70 - 99  mg/dL 104(H) 122(H) 126(H)  BUN 8 - 23 mg/dL 29(H) 28(H) 31(H)  Creatinine 0.44 - 1.00 mg/dL 1.09(H) 1.10(H) 1.10(H)  Sodium 135 - 145 mmol/L 137 135 134(L)  Potassium 3.5 - 5.1 mmol/L 3.8 3.4(L) 3.8  Chloride 98 - 111 mmol/L 99 97(L) 98  CO2 22 - 32 mmol/L 27 - 22  Calcium 8.9 - 10.3 mg/dL 8.3(L) - 8.4(L)  Total Protein 6.5 - 8.1 g/dL 5.3(L) - 5.8(L)  Total Bilirubin 0.3 - 1.2 mg/dL 0.3 - 0.4  Alkaline Phos 38 - 126 U/L 80 - 80  AST 15 - 41 U/L 26 - 37  ALT 0 - 44 U/L 27 - 33   CBC Latest Ref Rng & Units 03/23/2020 03/21/2020 03/21/2020  WBC 4.0 - 10.5 K/uL 17.3(H) - 19.5(H)  Hemoglobin 12.0 - 15.0 g/dL 7.9(L) 8.8(L) 9.1(L)  Hematocrit 36 - 46 % 26.7(L) 26.0(L) 30.5(L)  Platelets 150 - 400 K/uL 375 - 427(H)   Lipid Panel No results for input(s): CHOL, TRIG, LDLCALC, VLDL, HDL, CHOLHDL, LDLDIRECT in the last 8760 hours.  HEMOGLOBIN A1C Lab Results  Component Value Date   HGBA1C 5.0 07/31/2018   MPG 96.8 07/31/2018   TSH Recent Labs    03/21/20 2329  TSH 5.095*   BNP (last 3 results) Recent Labs    03/20/20 1043 03/21/20 2116  BNP 874.3* 1,091.1*    Ref Range & Units 03/21/2020 03/21/2020  Troponin I (High Sensitivity) <18 ng/L 92High  80High       Medications and allergies   Allergies  Allergen Reactions  . Codeine Nausea Only  . Epinephrine     Tachcardyia, chest pains tremors  . Tenormin [Atenolol] Rash     Current Meds  Medication Sig  . acetaminophen (TYLENOL) 500 MG tablet Take 1,000 mg by mouth 3 (three) times daily as needed for moderate pain.   Marland Kitchen  apixaban (ELIQUIS) 5 MG TABS tablet Take 1 tablet (5 mg total) by mouth 2 (two) times daily.  . furosemide (LASIX) 40 MG tablet Take 1 tablet (40 mg total) by mouth 2 (two) times daily.  Marland Kitchen levothyroxine (SYNTHROID, LEVOTHROID) 112 MCG tablet Take 112-168 mcg by mouth daily before breakfast. Take 1 tablet (112 mcg) on Mondays, Tuesdays, Wednesdays, Thursdays, Fridays & Saturdays. Take 1.5 tablet (168 mcg) on Sundays  . lidocaine (LMX) 4 % cream Apply 1 application topically 4 (four) times daily as needed (pain.).   Marland Kitchen metoprolol tartrate (LOPRESSOR) 25 MG tablet Take 1 tablet (25 mg total) by mouth 2 (two) times daily.  . pantoprazole (PROTONIX) 40 MG tablet Take 40 mg by mouth daily as needed (indigestion/heartburn.).   Marland Kitchen potassium chloride (KLOR-CON) 10 MEQ tablet Take 1 tablet (10 mEq total) by mouth 2 (two) times daily.    Scheduled Meds: . amiodarone  200 mg Oral BID  . apixaban  5 mg Oral BID  . levothyroxine  112 mcg Oral Once per day on Mon Tue Wed Thu Fri Sat   And  . levothyroxine  168 mcg Oral Once per day on Sun  . magnesium oxide  400 mg Oral BID  . metoprolol tartrate  25 mg Oral BID  .  morphine injection  1 mg Intravenous Once  . off the beat book   Does not apply Once   Continuous Infusions: . cefTRIAXone (ROCEPHIN)  IV 2 g (03/22/20 1713)   PRN Meds:.acetaminophen, lidocaine, metoprolol tartrate, pantoprazole   I/O last 3 completed shifts: In: 539.9 [P.O.:240; IV Piggyback:299.9] Out: 550 [Urine:550] No  intake/output data recorded.    Radiology:   [facility] Chest Port 1 View  Result Date: 03/21/2020 CLINICAL DATA:  84 year old female with questionable sepsis. EXAM: PORTABLE CHEST 1 VIEW COMPARISON:  Chest radiograph dated 06/21/2017. FINDINGS: Mild chronic interstitial coarsening. No focal consolidation, pleural effusion, or pneumothorax. Stable mild cardiomegaly. Atherosclerotic calcification of the aorta. No acute osseous pathology. IMPRESSION: No active disease.  Electronically Signed   By: Anner Crete M.D.   On: 03/21/2020 21:45    Cardiac Studies:   Lexiscan Myoview stress test  04/23/2018: 1. The resting electrocardiogram demonstrated normal sinus rhythm, normal resting conduction and no resting arrhythmias.  Stress EKG is non-diagnostic for ischemia as it a pharmacologic stress using Lexiscan. Occasional PAC and PVC. The patient developed significant symptoms which included Chest Pain, Stomach pain, Headache.  2. The overall quality of the study is good. There is no evidence of abnormal lung activity. Stress and rest SPECT images demonstrate homogeneous tracer distribution throughout the myocardium. Gated SPECT imaging reveals diffuse global hypokinesis. The left ventricular ejection fraction was markedly depressed at (26%).   3. This is a high risk study due to severe LV systolic dysfunction. Findings may represent non ischemic cardiomyopathy.  Echocardiogram 03/10/2020:  Left ventricle cavity is normal in size. Moderate concentric hypertrophy of the left ventricle. Severe global hypokinesis. LVEF 15-20%. Doppler evidence of grade II (pseudonormal) diastolic dysfunction, elevated LAP.  The aortic root is mildly dilated 3.9 cm.  Left atrial cavity is severely dilated at 65 cc/m2.  Trileaflet aortic valve. Moderate (Grade II) aortic regurgitation.  Moderate (Grade III) mitral regurgitation.  Mild tricuspid regurgitation. Estimated pulmonary artery systolic pressure 38 mmHg.  Mild pulmonic regurgitation.  Previous study on 07/30/2018, EF 40% (reported normal)   EKG:  EKG 03/21/2020: Atrial fibrillation with rapid ventricular response at rate of 142 bpm, left axis deviation, left anterior fascicular block.  Poor R wave progression, cannot exclude anteroseptal infarct old.  Nonspecific T abnormality.  EKG 03/22/2020: Normal sinus rhythm at rate of 80 bpm, left axis deviation, left intrafascicular block.  Poor R wave progression, cannot exclude  anteroseptal infarct old.  Frequent PACs.  Nonspecific T abnormality.  EKG 04/23/2020: Atypical atrial flutter with variable AV conduction, left axis deviation, left anterior fascicular block.  Poor R wave progression, cannot exclude anteroseptal infarct old.  LVH with repolarization abnormality.  Telemetry: Predominantly normal sinus rhythm with occasional PACs and PVCs.  Assessment   1.  Paroxysmal atrial fibrillation with RVR SP successful cardioversion on 03/21/2020, maintain sinus rhythm on amiodarone. 2.  Chronic systolic heart failure, presently well compensated.  Tolerating low-dose beta-blocker, Delene Loll has been initiated today.  We will follow up on serum creatinine and blood pressure. 3.  Supine hypertension and orthostatic hypotension. 4.  UTI 5.  Nonischemic cardiomyopathy, negative nuclear stress test in September 2019.  CHA2DS2-VASc Score is 5.  Yearly risk of stroke: 7.2% (A, F, HTN, CHF).  Score of 1=0.6; 2=2.2; 3=3.2; 4=4.8; 5=7.2; 6=9.8; 7=>9.8) -(CHF; HTN; vasc disease DM,  Female = 1; Age <65 =0; 65-74 = 1,  >75 =2; stroke/embolism= 2).    Medications Discontinued During This Encounter  Medication Reason  . metoprolol tartrate (LOPRESSOR) tablet 25 mg   . etomidate (AMIDATE) injection 10 mg Inpatient Standard  . levothyroxine (SYNTHROID) tablet 112-168 mcg   . cefTRIAXone (ROCEPHIN) 1 g in sodium chloride 0.9 % 100 mL IVPB   . cefTRIAXone (ROCEPHIN) 2 g in sodium chloride 0.9 % 100 mL IVPB     Recommendations:  AIVA MISKELL  is a 84 y.o. with bilateral hip replacement and knee replacement and left breast lumpectomy in 2018 without chemotherapy or radiation therapy for breast cancer and also remote hysterectomy due to uterine cancer in 1991, supine hypertension and orthostatic hypotension. She has chronic dizziness on standing. Has not been able to tolerate any BP medications due to severe dizzy spells.  She was seen in office on 03/19/2020 when she presented with  pleuritic type chest pain, worsening shortness of breath, not feeling well.  4 to 5 days prior to that she had presented with severe fever chills, cough and was seen at an urgent care with Covid negative.  On evaluation in my office I had found her to be in atypical atrial flutter and had scheduled her for direct-current cardioversion.   She is status post direct-current cardioversion, I had started her on amiodarone yesterday, she is now maintaining sinus rhythm with occasional PACs.  Frequent PACs has resolved.  Patient also had multifocal atrial tachycardia SP direct-current cardioversion on 03/21/2020.  In spite of severe LV systolic dysfunction, she is not in any acute decompensated heart failure.  She is tolerating low-dose beta-blocker therapy, previously due to orthostatic hypotension had difficulty in taking the medication.  I would like to challenge her with Entresto at low dose and follow-up on serum creatinine and also blood pressure.  Chest pain clearly appears to be musculoskeletal.  Do not suspect ACS with negative cardiac EKG findings, no significant delta change in high sensitive serum troponins.  Suspect her symptoms of fatigue, chills was probably related to severe UTI and blood cultures are positive as well.  When she is stable from medical standpoint, she can be discharged home on metoprolol tartrate 25 mg twice daily along with Entresto 24/26 mg twice daily and amiodarone 200 mg daily and I will see her in the office in 1 to 2 weeks for follow-up.  Continue furosemide 40 mg twice daily for now.   Adrian Prows, MD, [facility] 03/23/2020, 11:53 AM Office: [phone]

## 2020-03-24 ENCOUNTER — Ambulatory Visit (HOSPITAL_COMMUNITY): Admission: RE | Admit: 2020-03-24 | Payer: PPO | Source: Home / Self Care | Admitting: Cardiology

## 2020-03-24 ENCOUNTER — Encounter (HOSPITAL_COMMUNITY): Admission: RE | Payer: Self-pay | Source: Home / Self Care

## 2020-03-24 LAB — CBC
HCT: 30.1 % — ABNORMAL LOW (ref 36.0–46.0)
Hemoglobin: 8.8 g/dL — ABNORMAL LOW (ref 12.0–15.0)
MCH: 22.8 pg — ABNORMAL LOW (ref 26.0–34.0)
MCHC: 29.2 g/dL — ABNORMAL LOW (ref 30.0–36.0)
MCV: 78 fL — ABNORMAL LOW (ref 80.0–100.0)
Platelets: 458 10*3/uL — ABNORMAL HIGH (ref 150–400)
RBC: 3.86 MIL/uL — ABNORMAL LOW (ref 3.87–5.11)
RDW: 17.2 % — ABNORMAL HIGH (ref 11.5–15.5)
WBC: 16.1 10*3/uL — ABNORMAL HIGH (ref 4.0–10.5)
nRBC: 0.3 % — ABNORMAL HIGH (ref 0.0–0.2)

## 2020-03-24 LAB — CULTURE, BLOOD (ROUTINE X 2): Special Requests: ADEQUATE

## 2020-03-24 LAB — COMPREHENSIVE METABOLIC PANEL
ALT: 28 U/L (ref 0–44)
AST: 31 U/L (ref 15–41)
Albumin: 2.1 g/dL — ABNORMAL LOW (ref 3.5–5.0)
Alkaline Phosphatase: 79 U/L (ref 38–126)
Anion gap: 11 (ref 5–15)
BUN: 27 mg/dL — ABNORMAL HIGH (ref 8–23)
CO2: 26 mmol/L (ref 22–32)
Calcium: 8.3 mg/dL — ABNORMAL LOW (ref 8.9–10.3)
Chloride: 101 mmol/L (ref 98–111)
Creatinine, Ser: 1 mg/dL (ref 0.44–1.00)
GFR calc Af Amer: >60 mL/min (ref >60)
GFR calc non Af Amer: 53 mL/min — ABNORMAL LOW (ref >60)
Glucose, Bld: 106 mg/dL — ABNORMAL HIGH (ref 70–99)
Potassium: 3.9 mmol/L (ref 3.5–5.1)
Sodium: 138 mmol/L (ref 135–145)
Total Bilirubin: 0.5 mg/dL (ref 0.3–1.2)
Total Protein: 5.1 g/dL — ABNORMAL LOW (ref 6.5–8.1)

## 2020-03-24 LAB — URINE CULTURE: Culture: >=100000 — AB

## 2020-03-24 LAB — MAGNESIUM: Magnesium: 2.2 mg/dL (ref 1.7–2.4)

## 2020-03-24 SURGERY — CARDIOVERSION
Anesthesia: Monitor Anesthesia Care

## 2020-03-24 MED ORDER — CEFAZOLIN SODIUM-DEXTROSE 2-4 GM/100ML-% IV SOLN
2.0000 g | Freq: Two times a day (BID) | INTRAVENOUS | Status: DC
  Administered 2020-03-24: 2 g via INTRAVENOUS
  Filled 2020-03-24 (×2): qty 100

## 2020-03-24 MED ORDER — CEFAZOLIN SODIUM-DEXTROSE 2-4 GM/100ML-% IV SOLN
2.0000 g | Freq: Three times a day (TID) | INTRAVENOUS | Status: DC
  Administered 2020-03-25: 2 g via INTRAVENOUS
  Filled 2020-03-24 (×2): qty 100

## 2020-03-24 MED ORDER — DAPAGLIFLOZIN PROPANEDIOL 10 MG PO TABS
10.0000 mg | ORAL_TABLET | Freq: Every day | ORAL | Status: DC
  Administered 2020-03-24: 10 mg via ORAL
  Filled 2020-03-24 (×2): qty 1

## 2020-03-24 MED ORDER — LIVING BETTER WITH HEART FAILURE BOOK: Freq: Once | Status: DC

## 2020-03-24 NOTE — Progress Notes (Signed)
Heart Failure Patient Advocate Encounter   Prior authorization was approved for patient to receive [place].  Patient ID: [id number] Effective dates: 03/24/20 through 07/24/20  Kerby Nora, PharmD, BCPS Heart Failure Stewardship Pharmacist Phone [phone]

## 2020-03-24 NOTE — Progress Notes (Signed)
Pt is indicating she is not a diabetic and would like to know why she is on [place]. Will update Drs [name] and Pokhrel via text  Of pt question.

## 2020-03-24 NOTE — Progress Notes (Signed)
Heart Failure Patient Advocate Encounter   Received notification from [employer] that prior authorization for Wilder Glade is required.   PA submitted on CoverMyMeds Key [id number] Status is pending   Will continue to follow.  Kerby Nora, PharmD, BCPS Phone [phone] Heart Failure Stewardship Pharmacist

## 2020-03-24 NOTE — Progress Notes (Addendum)
Subjective:  Feeling better. Tolerating all medications well.   Intake/Output from previous day:  I/O last 3 completed shifts: In: 480 [P.O.:480] Out: 1000 [Urine:1000] No intake/output data recorded.  Blood pressure 135/61, pulse 81, temperature 98.4 F (36.9 C), temperature source Oral, resp. rate 16, height 5' 4"  (1.626 m), weight 60.3 kg, last menstrual period 07/25/1988, SpO2 94 %.  Orthostatic VS for the past 72 hrs (Last 3 readings):  BP Location  03/24/20 0833 Right Arm  03/24/20 0500 Right Arm  03/23/20 2100 Right Arm    Physical Exam Constitutional:      General: She is not in acute distress.    Appearance: She is ill-appearing.     Comments: Moderately build, frail  Cardiovascular:     Rate and Rhythm: Tachycardia present. Rhythm irregular.     Pulses: Intact distal pulses.          Radial pulses are 2+ on the right side and 2+ on the left side.       Dorsalis pedis pulses are 1+ on the right side and 1+ on the left side.       Posterior tibial pulses are 1+ on the right side and 1+ on the left side.     Heart sounds: S1 normal and S2 normal. No murmur heard.  No gallop.      Comments: 1-No pedal edema, no JVD  Pulmonary:     Effort: No accessory muscle usage or respiratory distress.     Comments: Labored breathing without hypoxia or acute respiratory distress.    Lab Results: BMP BNP (last 3 results) Recent Labs    03/20/20 1043 03/21/20 2116  BNP 874.3* 1,091.1*    ProBNP (last 3 results) No results for input(s): PROBNP in the last 8760 hours. BMP Latest Ref Rng & Units 03/24/2020 03/23/2020 03/21/2020  Glucose 70 - 99 mg/dL 106(H) 104(H) 122(H)  BUN 8 - 23 mg/dL 27(H) 29(H) 28(H)  Creatinine 0.44 - 1.00 mg/dL 1.00 1.09(H) 1.10(H)  BUN/Creat Ratio 12 - 28 - - -  Sodium 135 - 145 mmol/L 138 137 135  Potassium 3.5 - 5.1 mmol/L 3.9 3.8 3.4(L)  Chloride 98 - 111 mmol/L 101 99 97(L)  CO2 22 - 32 mmol/L 26 27 -  Calcium 8.9 - 10.3 mg/dL 8.3(L) 8.3(L) -     Hepatic Function Latest Ref Rng & Units 03/24/2020 03/23/2020 03/21/2020  Total Protein 6.5 - 8.1 g/dL 5.1(L) 5.3(L) 5.8(L)  Albumin 3.5 - 5.0 g/dL 2.1(L) 2.2(L) 2.5(L)  AST 15 - 41 U/L 31 26 37  ALT 0 - 44 U/L 28 27 33  Alk Phosphatase 38 - 126 U/L 79 80 80  Total Bilirubin 0.3 - 1.2 mg/dL 0.5 0.3 0.4  Bilirubin, Direct 0.0 - 0.2 mg/dL - - 0.2   CBC Latest Ref Rng & Units 03/24/2020 03/23/2020 03/21/2020  WBC 4.0 - 10.5 K/uL 16.1(H) 17.3(H) -  Hemoglobin 12.0 - 15.0 g/dL 8.8(L) 7.9(L) 8.8(L)  Hematocrit 36 - 46 % 30.1(L) 26.7(L) 26.0(L)  Platelets 150 - 400 K/uL 458(H) 375 -   Lipid Panel     Component Value Date/Time   CHOL 142 07/31/2018 0655   TRIG 72 07/31/2018 0655   HDL 45 07/31/2018 0655   CHOLHDL 3.2 07/31/2018 0655   VLDL 14 07/31/2018 0655   LDLCALC 83 07/31/2018 0655   Cardiac Panel (last 3 results) No results for input(s): CKTOTAL, CKMB, TROPONINI, RELINDX in the last 72 hours.  HEMOGLOBIN A1C Lab Results  Component Value Date  HGBA1C 5.0 07/31/2018   MPG 96.8 07/31/2018   TSH Recent Labs    03/21/20 2329  TSH 5.095*   Imaging: No results found.  Scheduled Meds: . amiodarone  200 mg Oral BID  . apixaban  5 mg Oral BID  . levothyroxine  112 mcg Oral Once per day on Mon Tue Wed Thu Fri Sat   And  . levothyroxine  168 mcg Oral Once per day on Sun  . Living Better with Heart Failure Book   Does not apply Once  . magnesium oxide  400 mg Oral BID  . metoprolol tartrate  25 mg Oral BID  . off the beat book   Does not apply Once  . sacubitril-valsartan  1 tablet Oral BID   Continuous Infusions: . cefTRIAXone (ROCEPHIN)  IV 2 g (03/23/20 1619)   PRN Meds:.acetaminophen, lidocaine, metoprolol tartrate, oxyCODONE, pantoprazole   Lexiscan Myoview stress test 04/23/2018: 1. The resting electrocardiogram demonstrated normal sinus rhythm, normal resting conduction and no resting arrhythmias. Stress EKG is non-diagnostic for ischemia as it a pharmacologic  stress using Lexiscan. Occasional PAC and PVC. The patient developed significant symptoms which included Chest Pain, Stomach pain, Headache.  2. The overall quality of the study is good. There is no evidence of abnormal lung activity. Stress and rest SPECT images demonstrate homogeneous tracer distribution throughout the myocardium. Gated SPECT imaging reveals diffuse global hypokinesis. The left ventricular ejection fraction was markedly depressed at (26%).  3. This is a high risk study due to severe LV systolic dysfunction. Findings may represent non ischemic cardiomyopathy.  Echocardiogram 03/10/2020:  Left ventricle cavity is normal in size. Moderate concentric hypertrophy of the left ventricle. Severe global hypokinesis. LVEF 15-20%. Doppler evidence of grade II (pseudonormal) diastolic dysfunction, elevated LAP.  The aortic root is mildly dilated 3.9 cm.  Left atrial cavity is severely dilated at 65 cc/m2.  Trileaflet aortic valve. Moderate (Grade II) aortic regurgitation.  Moderate (Grade III) mitral regurgitation.  Mild tricuspid regurgitation. Estimated pulmonary artery systolic pressure 38 mmHg.  Mild pulmonic regurgitation.  Previous study on 07/30/2018, EF 40% (reported normal)  EKG:  EKG 03/21/2020: Atrial fibrillation with rapid ventricular response at rate of 142 bpm, left axis deviation, left anterior fascicular block.  Poor R wave progression, cannot exclude anteroseptal infarct old.  Nonspecific T abnormality.  EKG 03/22/2020: Normal sinus rhythm at rate of 80 bpm, left axis deviation, left intrafascicular block.  Poor R wave progression, cannot exclude anteroseptal infarct old.  Frequent PACs.  Nonspecific T abnormality.  EKG 04/23/2020: Atypical atrial flutter with variable AV conduction, left axis deviation, left anterior fascicular block. Poor R wave progression, cannot exclude anteroseptal infarct old. LVH with repolarization abnormality.  Telemetry: Predominantly  normal sinus rhythm with occasional PACs and PVCs.  Assessment   1.  Paroxysmal atrial fibrillation with RVR SP successful cardioversion on 03/21/2020, maintain sinus rhythm on amiodarone. 2.  Chronic systolic heart failure, presently well compensated.  Tolerating low-dose beta-blocker, Delene Loll has been initiated today.  We will follow up on serum creatinine and blood pressure. 3.  Supine hypertension and orthostatic hypotension. 4.  UTI 5.  Nonischemic cardiomyopathy, negative nuclear stress test in September 2019.  CHA2DS2-VASc Score is 5.  Yearly risk of stroke: 7.2% (A, F, HTN, CHF).  Score of 1=0.6; 2=2.2; 3=3.2; 4=4.8; 5=7.2; 6=9.8; 7=>9.8) -(CHF; HTN; vasc disease DM,  Female = 1; Age <65 =0; 65-74 = 1,  >75 =2; stroke/embolism= 2).    Recommendations:   She is tolerating Delene Loll  and Amiodarone and Moderate dose BB. Continue the same and continue Furosemide 40 mg daily for now. Check ambulation prior to discharge. Will need home PT and HH to f/u on CHF and medication management.   I will arrange TOC visit in 1 week with [place] for CHF and will continue to monitor her BP closely. Decrease Amio to 200 mg daily on discharge.   She is orthostatic positive today with standing BP 86 mm Hg and standing 897 mm Hg systolic.  Patient will be started on Farxiga for CHF and upon discharge, Lasix can be made PRN for leg swelling and dyspnea. Patient is very compliant with instructions.   Adrian Prows, MD, [facility] 03/24/2020, 9:57 AM Office: [phone]

## 2020-03-24 NOTE — Progress Notes (Signed)
Heart Failure Patient Advocate Encounter   Prior authorization was approved for patient to receive Entresto.  Patient ID: [id number] Effective dates: 03/23/20 through 03/23/21  Kerby Nora, PharmD, BCPS Heart Failure Stewardship Pharmacist Phone [phone]

## 2020-03-24 NOTE — TOC Benefit Eligibility Note (Signed)
Transition of Care [facility]) Benefit Eligibility Note    Patient Details  Name: ODYSSEY VASBINDER MRN: [id number] Date of Birth: 22-Mar-1936   Medication/Dose: Delene Loll 24-26 MG BID  Covered?: Yes  Tier: 3 Drug  Prescription Coverage Preferred Pharmacy: [facility] with Person/Company/Phone Number:: [employer]   @ [employer] #  [id number] OPT- 2  Co-Pay: $61.87  Prior Approval: No  Deductible: Met  Additional Notes: ELIQUIS  5 MG BID  COVER- YES  CO-PAY- $45.00   TIER- 3 DRUG   P/A- NO    Memory Argue Phone Number: 03/24/2020, 11:29 AM

## 2020-03-24 NOTE — Care Management (Signed)
1038 03-24-20 Benefits check submitted for Entresto and Eliquis. Case Manager will follow for cost. Graves-Bigelow, Ocie Cornfield, RN,BSN Case Manager

## 2020-03-24 NOTE — Progress Notes (Addendum)
Heart Failure Stewardship Pharmacist Progress Note   PCP: [name], MD PCP-Cardiologist: No primary care provider on file.    HPI:  84 yo F with PMH of hypothyroidism, HTN, afib with RVR, and CHF presenting to [facility] ED on 03/21/20 with afib RVR. She was cardioverted in the ED but returned to afib. She is also receiving ceftriaxone for ecoli bacteremia and UTI. Last ECHO done on 03/11/20 and showed LVEF of 15-20% with moderate aortic and mitral regurgitation.  Current HF Medications: Metoprolol 25 mg BID Entresto 24/26 mg BID  Prior to admission HF Medications: Furosemide 40 mg BID Metoprolol 25 mg BID  Pertinent Lab Values: . Serum creatinine 1.00, BUN 27, Potassium 3.9, Sodium 138, BNP 1091.1, Magnesium 2.2  Vital Signs: . Weight: 132 lbs (admission weight: 134 lbs) . Blood pressure: 130/60s . Heart rate: 70s  Medication Assistance / Insurance Benefits Check: Does the patient have prescription insurance?  Yes Type of insurance plan: Medicare - [employer]  Does the patient qualify for medication assistance through manufacturers or grants?   Pending . Eligible grants and/or patient assistance programs: pending . Medication assistance applications in progress: none  . Medication assistance applications approved: none Approved medication assistance renewals will be completed by: Dr. Irven Shelling office  Outpatient Pharmacy:  Prior to admission outpatient pharmacy: [facility] Is the patient willing to use [facility] at discharge? Yes Is the patient willing to transition their outpatient pharmacy to utilize a [facility] outpatient pharmacy?   Pending    Assessment: 1. Acute on chronic systolic CHF (EF 53-61%). NYHA class II symptoms. - After discussing with Dr. [name], will continue furosemide PRN at discharge - Continue metoprolol 25 mg BID - Continue Entresto 24/26 mg BID - Consider starting Farxiga 10 mg daily - Holding addition of spironolactone given  orthostatic hypotension   Plan: 1) Medication changes recommended at this time: - Start Farxiga 10 mg daily  2) Patient assistance - Prior authorization for [employer] and Farxiga completed and approved - Entresto copay $61.87 - Farxiga copay $94.65 - Will discuss options for patient assistance through drug manufacturers if copay is too high for her  3)  Education  - To be completed prior to discharge  Kerby Nora, PharmD, BCPS Heart Failure Cytogeneticist Phone [phone]

## 2020-03-24 NOTE — Evaluation (Signed)
Physical Therapy Evaluation Patient Details Name: Heidi Adams MRN: [id number] DOB: 11/27/1935 Today's Date: 03/24/2020   History of Present Illness  84 year old female who presents with complaint of shortness of breath, in addition to heart palpitations, left sided intermittent chest pain and lightheadedness and dizziness for the past 2 to 3 days. In ED pt tachycardic HR in 150s, with chest pain, and fever of 102. Pt cardioverted and HR in 80s. Admitted 03/21/20 for treatment of UTI.  Clinical Impression  PTA pt living with husband in single story home with 1 step to enter. Pt reports limited community ambulation while holding onto her husband for support. States that she is able to perform bathing and dressing but that her husband provides for iADLs. Pt is currently limited in safe mobility by orthostatic hypotension. Pt is mod A for bed mobility and BP dropped to 89/44 with sitting EoB and pt with c/o dizziness and request to return to bed. Pt requires min A for returning to bed. PT recommending HHPT at discharge to improve mobility in home environment. PT will continue to follow acutely.     Follow Up Recommendations Home health PT;Supervision/Assistance - 24 hour    Equipment Recommendations  None recommended by PT (has BSC and RW)    Recommendations for Other Services OT consult     Precautions / Restrictions Precautions Precautions: Fall Precaution Comments: orthostatic hypotension  Restrictions Weight Bearing Restrictions: No      Mobility  Bed Mobility Overal bed mobility: Needs Assistance Bed Mobility: Supine to Sit;Sit to Supine     Supine to sit: Mod assist Sit to supine: Min assist   General bed mobility comments: pt able to move B LE off bed but is unable to use either UE for assist to come to EoB due to pain, pt requires modA for bringing trunk to upright, pt requires min A for management of LE back into bed.   Transfers                 General transfer  comment: deferred due to orthostatic hypotension         Balance Overall balance assessment: Needs assistance Sitting-balance support: Feet unsupported;No upper extremity supported (unable to use UE for support due to pain ) Sitting balance-Leahy Scale: Poor                                       Pertinent Vitals/Pain Pain Assessment: 0-10 Pain Score: 10-Worst pain ever Pain Location: R wrist Pain Descriptors / Indicators: Aching;Dull;Sharp Pain Intervention(s): Limited activity within patient's tolerance;Monitored during session;Premedicated before session    Home Living Family/patient expects to be discharged to:: Private residence Living Arrangements: Spouse/significant other Available Help at Discharge: Family Type of Home: House Home Access: Stairs to enter Entrance Stairs-Rails: None Entrance Stairs-Number of Steps: 1 Home Layout: One level Home Equipment: Clinical cytogeneticist - 2 wheels;Bedside commode      Prior Function Level of Independence: Needs assistance   Gait / Transfers Assistance Needed: needs support from husband for ambulation  ADL's / Homemaking Assistance Needed: independent in bathing and dressing, husband does iADLs  Comments: likely has difficulty with utilizing UE for support due to pain      Hand Dominance   Dominant Hand: Right    Extremity/Trunk Assessment   Upper Extremity Assessment Upper Extremity Assessment: RUE deficits/detail;LUE deficits/detail RUE Deficits / Details: R wrist pain LUE Deficits /  Details: L elbow pain     Lower Extremity Assessment Lower Extremity Assessment: Generalized weakness    Cervical / Trunk Assessment Cervical / Trunk Assessment: Kyphotic  Communication   Communication: No difficulties  Cognition Arousal/Alertness: Awake/alert Behavior During Therapy: Flat affect Overall Cognitive Status: Within Functional Limits for tasks assessed                                         General Comments General comments (skin integrity, edema, etc.): BP in supine 126/64, HR 76bpm, BP in sitting 89/44 HR 80 bpm, husband Shanon Brow in room throughout sessio         Assessment/Plan    PT Assessment Patient needs continued PT services  PT Problem List Decreased strength;Decreased range of motion;Decreased activity tolerance;Decreased balance;Decreased mobility;Cardiopulmonary status limiting activity;Pain       PT Treatment Interventions DME instruction;Gait training;Functional mobility training;Therapeutic activities;Therapeutic exercise;Balance training;Cognitive remediation;Patient/family education    PT Goals (Current goals can be found in the Care Plan section)  Acute Rehab PT Goals Patient Stated Goal: have less pain PT Goal Formulation: With patient/family Time For Goal Achievement: 04/07/20 Potential to Achieve Goals: Fair    Frequency Min 3X/week   Barriers to discharge        Co-evaluation               AM-PAC PT "6 Clicks" Mobility  Outcome Measure Help needed turning from your back to your side while in a flat bed without using bedrails?: A Little Help needed moving from lying on your back to sitting on the side of a flat bed without using bedrails?: A Lot Help needed moving to and from a bed to a chair (including a wheelchair)?: Total Help needed standing up from a chair using your arms (e.g., wheelchair or bedside chair)?: Total Help needed to walk in hospital room?: Total Help needed climbing 3-5 steps with a railing? : Total 6 Click Score: 9    End of Session   Activity Tolerance: Treatment limited secondary to medical complications (Comment) (orthostatic hypotension ) Patient left: in bed;with call bell/phone within reach;with family/visitor present Nurse Communication: Mobility status;Other (comment) (orthostatics) PT Visit Diagnosis: Other abnormalities of gait and mobility (R26.89);Muscle weakness (generalized)  (M62.81);Difficulty in walking, not elsewhere classified (R26.2);Dizziness and giddiness (R42);Pain Pain - Right/Left:  (bilateral ) Pain - part of body:  (R wrist, L elbow)    Time: 6433-2951 PT Time Calculation (min) (ACUTE ONLY): 22 min   Charges:   PT Evaluation $PT Eval Moderate Complexity: 1 Mod          Wileen Duncanson B. Migdalia Dk PT, DPT Acute Rehabilitation Services Pager [phone] Office [phone]   [place] 03/24/2020, 2:19 PM

## 2020-03-24 NOTE — Plan of Care (Signed)
  Problem: Education: Goal: Ability to verbalize understanding of medication therapies will improve Outcome: Progressing   

## 2020-03-24 NOTE — Progress Notes (Addendum)
PROGRESS NOTE  Heidi Adams OMV:[id number] DOB: 11/08/1935 DOA: 03/21/2020 PCP: [name], MD   LOS: 1 day   Brief narrative: As per HPI,  Patient  is a 84 year old female with past medical history of hypothyroidism, hypertension, atrial fibrillation with RVR congestive heart failure presented to the hospital with shortness of breath for 2 to 3 days with palpitation dizziness lightheadedness and sweating with chest discomfort.  In the ER, the patient was tachycardic, heart rate up to 150s.  She also complained of chest pain.  She was also noted to have a fever fever of 102.  The ER physician discussed the situation with the cardiologist Dr. [name], the decision was made to cardiovert the patient.  She tolerated the procedure well.  Post cardioversion patient was admitted to the hospital for further observation.  Assessment/Plan:  Principal Problem:   Atrial fibrillation with RVR (HCC) Active Problems:   Hypothyroidism   Essential hypertension   Pharyngoesophageal dysphagia   Malignant neoplasm of upper-inner quadrant of left breast in female, estrogen receptor positive ([place])   Acute cystitis   History of congestive heart failure   E coli bacteremia   A-fib (HCC)  Paroxysmal atrial fibrillation with RVR She was cardioverted in the ER.Has been started on amiodarone with better control. Continue Eliquis, metoprolol and low-dose Entresto while in the hospital.  No plan for starting spironolactone at this time.  Shortness of breath has  Improved. Continue to monitor serum creatinine and blood pressure. Plan is to decrease amiodarone 200 mg daily on discharge.  Mild orthostatic hypotension today. Seen by cardiology. Recommend continuation of current medications. Hold Lasix today.   History of chronic systolic congestive heart failure.  2D echocardiogram on 03/11/2020 showed LV ejection fraction of 15 to 20% with moderate aortic and mitral regurgitation. on Entresto, metoprolol. Wilder Glade has  been started today. Patient follows up with Dr. [name] cardiology as outpatient.  Patient was on metoprolol and Lasix as outpatient. Patient will likely need home health on discharge including nursing for medication management. May consider Lasix as needed on discharge.  Chronic nocturnal use of oxygen 2L likely secondary to CHF.  Will continue while in the hospital.   Essential hypertension continue metoprolol.  Delene Loll has been added 03/23/2000.  Closely monitor blood pressure. Mildly orthostatic today. Lasix has been discontinued.   Hypothyroidism Continue Synthroid.  Acute cystitis with E. coli bacteremia. On IV Rocephin.  Urine culture showing E. coli.  Blood cultures with E. coli bacteremia.  Follow sensitivity, still pending.  Continue IV Rocephin 2 g daily.  Hypokalemia improved with replacement.  Closely monitor potassium on Entresto.  Debility, deconditioning.  Will ambulate the patient in hall as able.  DVT prophylaxis:  apixaban (ELIQUIS) tablet 5 mg    Code Status: Full code  Family Communication: I again spoke with the patient's husband at bedside  Status is: Inpatient  The patient will require  inpatient because:  Inpatient level of care appropriate due to severity of illness, status post cardioversion on amiodarone, E. coli bacteremia, orthostatic hypotension  Dispo: The patient is from: Home              Anticipated d/c is to: Home              Anticipated d/c date is: 1 to 2 days, follow cardiology recommendations and assess. Get physical therapy evaluation. Pending urine culture.currently adjusting medications, new orthostatic hypotension              Patient currently is  not medically stable to d/c.  Consultants:  Cardiology Dr. [name]  Procedures:  DC cardioversion  Antibiotics:  . Rocephin IV 8/28>  Subjective: Today, patient was seen and examined at bedside. Patient denies any chest pain, dizziness, lightheadedness feels little fatigued. Was  slightly orthostatic on standing today.   Objective: Vitals:   03/24/20 0817 03/24/20 0833  BP: 137/70 135/61  Pulse: 83 81  Resp:  16  Temp:  98.4 F (36.9 C)  SpO2:  94%    Intake/Output Summary (Last 24 hours) at 03/24/2020 1040 Last data filed at 03/23/2020 1704 Gross per 24 hour  Intake 240 ml  Output 450 ml  Net -210 ml   Filed Weights   03/22/20 0451 03/23/20 0341 03/24/20 0500  Weight: 62.6 kg 62.8 kg 60.3 kg   Body mass index is 22.82 kg/m.   Physical Exam:  GENERAL: Patient is alert awake and oriented. Not in obvious distress.  Thinly built, on nasal cannula oxygen, frail appearing HENT: No scleral pallor or icterus. Pupils equally reactive to light. Oral mucosa is moist NECK: is supple, no gross swelling noted. CHEST:  Diminished breath sounds bilaterally. CVS: S1 and S2 heard, systolic murmur, irregular rhythm, mild tachycardia ABDOMEN: Soft, non-tender, bowel sounds are present. EXTREMITIES: Trace edema noted CNS: Cranial nerves are intact. No focal motor deficits. SKIN: warm and dry without rashes.  Data Review: I have personally reviewed the following laboratory data and studies,  CBC: Recent Labs  Lab 03/20/20 1043 03/21/20 2116 03/21/20 2330 03/23/20 0239 03/24/20 0339  WBC 12.7* 19.5*  --  17.3* 16.1*  HGB 8.7* 9.1* 8.8* 7.9* 8.8*  HCT 28.7* 30.5* 26.0* 26.7* 30.1*  MCV 75* 79.0*  --  79.0* 78.0*  PLT 399 427*  --  375 937*   Basic Metabolic Panel: Recent Labs  Lab 03/20/20 1043 03/21/20 2116 03/21/20 2330 03/23/20 0239 03/24/20 0339  NA 136 134* 135 137 138  K 3.8 3.8 3.4* 3.8 3.9  CL 97 98 97* 99 101  CO2 23 22  --  27 26  GLUCOSE 108* 126* 122* 104* 106*  BUN 24 31* 28* 29* 27*  CREATININE 0.96 1.10* 1.10* 1.09* 1.00  CALCIUM 8.8 8.4*  --  8.3* 8.3*  MG 2.1 1.8  --  2.0 2.2  PHOS  --   --   --  2.8  --    Liver Function Tests: Recent Labs  Lab 03/20/20 1043 03/21/20 2116 03/23/20 0239 03/24/20 0339  AST 38 37 26 31    ALT 22 33 27 28  ALKPHOS 89 80 80 79  BILITOT 0.5 0.4 0.3 0.5  PROT 5.9* 5.8* 5.3* 5.1*  ALBUMIN 3.5* 2.5* 2.2* 2.1*   No results for input(s): LIPASE, AMYLASE in the last 168 hours. No results for input(s): AMMONIA in the last 168 hours. Cardiac Enzymes: No results for input(s): CKTOTAL, CKMB, CKMBINDEX, TROPONINI in the last 168 hours. BNP (last 3 results) Recent Labs    03/20/20 1043 03/21/20 2116  BNP 874.3* 1,091.1*    ProBNP (last 3 results) No results for input(s): PROBNP in the last 8760 hours.  CBG: Recent Labs  Lab 03/21/20 2120  GLUCAP 132*   Recent Results (from the past 240 hour(s))  SARS CORONAVIRUS 2 (TAT 6-24 HRS) Nasopharyngeal Nasopharyngeal Swab     Status: None   Collection Time: 03/20/20 11:33 AM   Specimen: Nasopharyngeal Swab  Result Value Ref Range Status   SARS Coronavirus 2 NEGATIVE NEGATIVE Final    Comment: (  NOTE) SARS-CoV-2 target nucleic acids are NOT DETECTED.  The SARS-CoV-2 RNA is generally detectable in upper and lower respiratory specimens during the acute phase of infection. Negative results do not preclude SARS-CoV-2 infection, do not rule out co-infections with other pathogens, and should not be used as the sole basis for treatment or other patient management decisions. Negative results must be combined with clinical observations, patient history, and epidemiological information. The expected result is Negative.  Fact Sheet for Patients: SugarRoll.be  Fact Sheet for Healthcare Providers: https://www.woods-mathews.com/  This test is not yet approved or cleared by the [place] FDA and  has been authorized for detection and/or diagnosis of SARS-CoV-2 by FDA under an Emergency Use Authorization (EUA). This EUA will remain  in effect (meaning this test can be used) for the duration of the COVID-19 declaration under Se ction 564(b)(1) of the Act, 21 U.S.C. section 360bbb-3(b)(1),  unless the authorization is terminated or revoked sooner.  Performed at [facility], [place] [address], [place], [place] [address]   SARS Coronavirus 2 by RT PCR (hospital order, performed in [facility] hospital lab) Nasopharyngeal Nasopharyngeal Swab     Status: None   Collection Time: 03/21/20  9:43 PM   Specimen: Nasopharyngeal Swab  Result Value Ref Range Status   SARS Coronavirus 2 NEGATIVE NEGATIVE Final    Comment: (NOTE) SARS-CoV-2 target nucleic acids are NOT DETECTED.  The SARS-CoV-2 RNA is generally detectable in upper and lower respiratory specimens during the acute phase of infection. The lowest concentration of SARS-CoV-2 viral copies this assay can detect is 250 copies / mL. A negative result does not preclude SARS-CoV-2 infection and should not be used as the sole basis for treatment or other patient management decisions.  A negative result may occur with improper specimen collection / handling, submission of specimen other than nasopharyngeal swab, presence of viral mutation(s) within the areas targeted by this assay, and inadequate number of viral copies (<250 copies / mL). A negative result must be combined with clinical observations, patient history, and epidemiological information.  Fact Sheet for Patients:   StrictlyIdeas.no  Fact Sheet for Healthcare Providers: BankingDealers.co.za  This test is not yet approved or  cleared by the [place] FDA and has been authorized for detection and/or diagnosis of SARS-CoV-2 by FDA under an Emergency Use Authorization (EUA).  This EUA will remain in effect (meaning this test can be used) for the duration of the COVID-19 declaration under Section 564(b)(1) of the Act, 21 U.S.C. section 360bbb-3(b)(1), unless the authorization is terminated or revoked sooner.  Performed at [facility], [place] [address] [place], [place] [address]   Blood Culture (routine x  2)     Status: None (Preliminary result)   Collection Time: 03/21/20  9:48 PM   Specimen: BLOOD  Result Value Ref Range Status   Specimen Description BLOOD SITE NOT SPECIFIED  Final   Special Requests   Final    BOTTLES DRAWN AEROBIC AND ANAEROBIC Blood Culture results may not be optimal due to an inadequate volume of blood received in culture bottles   Culture   Final    NO GROWTH 3 DAYS Performed at [facility], [place] [address] [place], [place] [address]    Report Status PENDING  Incomplete  Urine culture     Status: Abnormal   Collection Time: 03/21/20 10:43 PM   Specimen: In/Out Cath Urine  Result Value Ref Range Status   Specimen Description IN/OUT CATH URINE  Final   Special Requests   Final    NONE Performed at [facility], [place] [address], [place], [place] [address]    Culture >=100,000 COLONIES/mL ESCHERICHIA COLI (A)  Final   Report Status 03/24/2020 FINAL  Final   Organism ID, Bacteria ESCHERICHIA COLI (A)  Final      Susceptibility   Escherichia coli - MIC*    AMPICILLIN >=32 RESISTANT Resistant     CEFAZOLIN <=4 SENSITIVE Sensitive     CEFTRIAXONE <=0.25 SENSITIVE Sensitive     CIPROFLOXACIN <=0.25 SENSITIVE Sensitive     GENTAMICIN <=1 SENSITIVE Sensitive     IMIPENEM <=0.25 SENSITIVE Sensitive     NITROFURANTOIN <=16 SENSITIVE Sensitive     TRIMETH/SULFA <=20 SENSITIVE Sensitive     AMPICILLIN/SULBACTAM >=32 RESISTANT Resistant     PIP/TAZO <=4 SENSITIVE Sensitive     * >=100,000 COLONIES/mL ESCHERICHIA COLI  Blood Culture (routine x 2)     Status: Abnormal   Collection Time: 03/21/20 11:49 PM   Specimen: BLOOD LEFT HAND  Result Value Ref Range Status   Specimen Description BLOOD LEFT HAND  Final   Special Requests   Final    BOTTLES DRAWN AEROBIC AND ANAEROBIC Blood Culture adequate volume   Culture  Setup Time   Final    GRAM NEGATIVE RODS IN BOTH AEROBIC AND ANAEROBIC BOTTLES Organism ID to follow CRITICAL RESULT CALLED TO, READ BACK BY  AND VERIFIED WITHCatalina Lunger PHARMD, AT 1510 03/22/20 BY D. VANHOOK Performed at [facility], [place] [address], [place],  [address]    Culture ESCHERICHIA COLI (A)  Final   Report Status 03/24/2020 FINAL  Final   Organism ID, Bacteria ESCHERICHIA COLI  Final      Susceptibility   Escherichia coli - MIC*    AMPICILLIN >=32 RESISTANT Resistant     CEFAZOLIN <=4 SENSITIVE Sensitive     CEFEPIME <=0.12 SENSITIVE Sensitive     CEFTAZIDIME <=1 SENSITIVE Sensitive     CEFTRIAXONE <=0.25 SENSITIVE Sensitive     CIPROFLOXACIN <=0.25 SENSITIVE Sensitive     GENTAMICIN <=1 SENSITIVE Sensitive     IMIPENEM <=0.25 SENSITIVE Sensitive     TRIMETH/SULFA <=20 SENSITIVE Sensitive     AMPICILLIN/SULBACTAM >=32 RESISTANT Resistant     PIP/TAZO <=4 SENSITIVE Sensitive     * ESCHERICHIA COLI  Blood Culture ID Panel (Reflexed)     Status: Abnormal   Collection Time: 03/21/20 11:49 PM  Result Value Ref Range Status   Enterococcus faecalis NOT DETECTED NOT DETECTED Final   Enterococcus Faecium NOT DETECTED NOT DETECTED Final   Listeria monocytogenes NOT DETECTED NOT DETECTED Final   Staphylococcus species NOT DETECTED NOT DETECTED Final   Staphylococcus aureus (BCID) NOT DETECTED NOT DETECTED Final   Staphylococcus epidermidis NOT DETECTED NOT DETECTED Final   Staphylococcus lugdunensis NOT DETECTED NOT DETECTED Final   Streptococcus species NOT DETECTED NOT DETECTED Final   Streptococcus agalactiae NOT DETECTED NOT DETECTED Final   Streptococcus pneumoniae NOT DETECTED NOT DETECTED Final   Streptococcus pyogenes NOT DETECTED NOT DETECTED Final   A.calcoaceticus-baumannii NOT DETECTED NOT DETECTED Final   Bacteroides fragilis NOT DETECTED NOT DETECTED Final   Enterobacterales DETECTED (A) NOT DETECTED Final    Comment: Enterobacterales represent a large order of gram negative bacteria, not a single organism. CRITICAL RESULT CALLED TO, READ BACK BY AND VERIFIED WITH: H. VON DOHLEN  PHARMD, AT 1510 03/22/20 BY D. VANHOOK    Enterobacter cloacae complex NOT DETECTED  NOT DETECTED Final   Escherichia coli DETECTED (A) NOT DETECTED Final    Comment: CRITICAL RESULT CALLED TO, READ BACK BY AND VERIFIED WITH: H. VON DOHLEN PHARMD, AT 1510 03/22/20 BY D. VANHOOK    Klebsiella aerogenes NOT DETECTED NOT DETECTED Final   Klebsiella oxytoca NOT DETECTED NOT DETECTED Final   Klebsiella pneumoniae NOT DETECTED NOT DETECTED Final   Proteus species NOT DETECTED NOT DETECTED Final   Salmonella species NOT DETECTED NOT DETECTED Final   Serratia marcescens NOT DETECTED NOT DETECTED Final   Haemophilus influenzae NOT DETECTED NOT DETECTED Final   Neisseria meningitidis NOT DETECTED NOT DETECTED Final   Pseudomonas aeruginosa NOT DETECTED NOT DETECTED Final   Stenotrophomonas maltophilia NOT DETECTED NOT DETECTED Final   Candida albicans NOT DETECTED NOT DETECTED Final   Candida auris NOT DETECTED NOT DETECTED Final   Candida glabrata NOT DETECTED NOT DETECTED Final   Candida krusei NOT DETECTED NOT DETECTED Final   Candida parapsilosis NOT DETECTED NOT DETECTED Final   Candida tropicalis NOT DETECTED NOT DETECTED Final   Cryptococcus neoformans/gattii NOT DETECTED NOT DETECTED Final   CTX-M ESBL NOT DETECTED NOT DETECTED Final   Carbapenem resistance IMP NOT DETECTED NOT DETECTED Final   Carbapenem resistance KPC NOT DETECTED NOT DETECTED Final   Carbapenem resistance NDM NOT DETECTED NOT DETECTED Final   Carbapenem resist OXA 48 LIKE NOT DETECTED NOT DETECTED Final   Carbapenem resistance VIM NOT DETECTED NOT DETECTED Final    Comment: Performed at [facility], [address], [place], [place] [address]     Studies: No results found.    Flora Lipps, MD  [facility] 03/24/2020

## 2020-03-25 ENCOUNTER — Other Ambulatory Visit: Payer: Self-pay

## 2020-03-25 DIAGNOSIS — I951 Orthostatic hypotension: Secondary | ICD-10-CM

## 2020-03-25 DIAGNOSIS — I5023 Acute on chronic systolic (congestive) heart failure: Secondary | ICD-10-CM

## 2020-03-25 LAB — CBC
HCT: 34.1 % — ABNORMAL LOW (ref 36.0–46.0)
Hemoglobin: 9.9 g/dL — ABNORMAL LOW (ref 12.0–15.0)
MCH: 22.3 pg — ABNORMAL LOW (ref 26.0–34.0)
MCHC: 29 g/dL — ABNORMAL LOW (ref 30.0–36.0)
MCV: 76.8 fL — ABNORMAL LOW (ref 80.0–100.0)
Platelets: 505 10*3/uL — ABNORMAL HIGH (ref 150–400)
RBC: 4.44 MIL/uL (ref 3.87–5.11)
RDW: 17.2 % — ABNORMAL HIGH (ref 11.5–15.5)
WBC: 14.5 10*3/uL — ABNORMAL HIGH (ref 4.0–10.5)
nRBC: 0.2 % (ref 0.0–0.2)

## 2020-03-25 LAB — LACTIC ACID, PLASMA: Lactic Acid, Venous: 1.6 mmol/L (ref 0.5–1.9)

## 2020-03-25 LAB — BASIC METABOLIC PANEL
Anion gap: 10 (ref 5–15)
BUN: 30 mg/dL — ABNORMAL HIGH (ref 8–23)
CO2: 26 mmol/L (ref 22–32)
Calcium: 8.2 mg/dL — ABNORMAL LOW (ref 8.9–10.3)
Chloride: 101 mmol/L (ref 98–111)
Creatinine, Ser: 1.25 mg/dL — ABNORMAL HIGH (ref 0.44–1.00)
GFR calc Af Amer: 47 mL/min — ABNORMAL LOW (ref >60)
GFR calc non Af Amer: 40 mL/min — ABNORMAL LOW (ref >60)
Glucose, Bld: 112 mg/dL — ABNORMAL HIGH (ref 70–99)
Potassium: 3.7 mmol/L (ref 3.5–5.1)
Sodium: 137 mmol/L (ref 135–145)

## 2020-03-25 LAB — MAGNESIUM: Magnesium: 2.4 mg/dL (ref 1.7–2.4)

## 2020-03-25 MED ORDER — AMIODARONE HCL IN DEXTROSE 360-4.14 MG/200ML-% IV SOLN
INTRAVENOUS | Status: AC
  Filled 2020-03-25: qty 200

## 2020-03-25 MED ORDER — APIXABAN 2.5 MG PO TABS
2.5000 mg | ORAL_TABLET | Freq: Two times a day (BID) | ORAL | Status: DC
  Administered 2020-03-25 – 2020-03-27 (×5): 2.5 mg via ORAL
  Filled 2020-03-25 (×5): qty 1

## 2020-03-25 MED ORDER — CEFAZOLIN SODIUM-DEXTROSE 2-4 GM/100ML-% IV SOLN
2.0000 g | Freq: Two times a day (BID) | INTRAVENOUS | Status: DC
  Administered 2020-03-25: 2 g via INTRAVENOUS
  Filled 2020-03-25 (×2): qty 100

## 2020-03-25 MED ORDER — AMIODARONE IV BOLUS ONLY 150 MG/100ML
150.0000 mg | Freq: Once | INTRAVENOUS | Status: AC
  Administered 2020-03-25: 150 mg via INTRAVENOUS
  Filled 2020-03-25: qty 100

## 2020-03-25 MED ORDER — AMIODARONE LOAD VIA INFUSION: 150.0000 mg | Freq: Once | INTRAVENOUS | Status: DC

## 2020-03-25 NOTE — Progress Notes (Signed)
Subjective:  Feels lightheaded this morning. Denies shortness of breath, chest pain.  Underwent cardioversion on 03/24/2020, back in A. fib with RVR this morning. Hypotensive around 70/50 mmHg.  Objective:  Vital Signs in the last 24 hours: Temp:  [97.5 F (36.4 C)-97.6 F (36.4 C)] 97.5 F (36.4 C) (09/01 0500) Pulse Rate:  [66-70] 66 (09/01 0500) Resp:  [16-17] 17 (09/01 0500) BP: (118-130)/(67-76) 127/67 (09/01 0500) SpO2:  [98 %] 98 % (08/31 2039) Weight:  [59.5 kg] 59.5 kg (09/01 0500)  Intake/Output from previous day: 08/31 0701 - 09/01 0700 In: 100 [IV Piggyback:100] Out: 450 [Urine:450]  Physical Exam Vitals and nursing note reviewed.  Constitutional:      General: She is not in acute distress.    Appearance: She is well-developed.  HENT:     Head: Normocephalic and atraumatic.     Mouth/Throat:     Mouth: Mucous membranes are dry.  Eyes:     Conjunctiva/sclera: Conjunctivae normal.     Pupils: Pupils are equal, round, and reactive to light.  Neck:     Vascular: No JVD.  Cardiovascular:     Rate and Rhythm: Normal rate and regular rhythm.     Pulses: Normal pulses and intact distal pulses.     Heart sounds: Normal heart sounds. No murmur heard.   Pulmonary:     Effort: Pulmonary effort is normal.     Breath sounds: Normal breath sounds. No wheezing or rales.  Abdominal:     General: Bowel sounds are normal.     Palpations: Abdomen is soft.     Tenderness: There is no rebound.  Musculoskeletal:        General: No tenderness. Normal range of motion.     Left lower leg: No edema.  Lymphadenopathy:     Cervical: No cervical adenopathy.  Skin:    General: Skin is warm and dry.  Neurological:     Mental Status: She is alert and oriented to person, place, and time.     Cranial Nerves: No cranial nerve deficit.      Lab Results: BMP Recent Labs    03/23/20 0239 03/24/20 0339 03/25/20 0452  NA 137 138 137  K 3.8 3.9 3.7  CL 99 101 101  CO2 27 26  26   GLUCOSE 104* 106* 112*  BUN 29* 27* 30*  CREATININE 1.09* 1.00 1.25*  CALCIUM 8.3* 8.3* 8.2*  GFRNONAA 48* 53* 40*  GFRAA 55* >60 47*    CBC Recent Labs  Lab 03/25/20 0452  WBC 14.5*  RBC 4.44  HGB 9.9*  HCT 34.1*  PLT 505*  MCV 76.8*  MCH 22.3*  MCHC 29.0*  RDW 17.2*    HEMOGLOBIN A1C Lab Results  Component Value Date   HGBA1C 5.0 07/31/2018   MPG 96.8 07/31/2018    Cardiac Panel (last 3 results) No results for input(s): CKTOTAL, CKMB, TROPONINI, RELINDX in the last 8760 hours.  BNP (last 3 results) Recent Labs    03/20/20 1043 03/21/20 2116  BNP 874.3* 1,091.1*    TSH Recent Labs    03/21/20 2329  TSH 5.095*    Lipid Panel     Component Value Date/Time   CHOL 142 07/31/2018 0655   TRIG 72 07/31/2018 0655   HDL 45 07/31/2018 0655   CHOLHDL 3.2 07/31/2018 0655   VLDL 14 07/31/2018 0655   LDLCALC 83 07/31/2018 0655     Hepatic Function Panel Recent Labs    03/21/20 2116 03/23/20 0239 03/24/20 2992  PROT 5.8* 5.3* 5.1*  ALBUMIN 2.5* 2.2* 2.1*  AST 37 26 31  ALT 33 27 28  ALKPHOS 80 80 79  BILITOT 0.4 0.3 0.5  BILIDIR 0.2  --   --   IBILI 0.2*  --   --     Cardiovascular studies:  EKG 03/21/2020:  Atrial fibrillation with rapid ventricular response at rate of 142 bpm, left axis deviation, left anterior fascicular block. Poor R wave progression, cannot exclude anteroseptal infarct old. Nonspecific T abnormality.  Lexiscan Myoview stress test 04/23/2018: 1. The resting electrocardiogram demonstrated normal sinus rhythm, normal resting conduction and no resting arrhythmias. Stress EKG is non-diagnostic for ischemia as it a pharmacologic stress using Lexiscan. Occasional PAC and PVC. The patient developed significant symptoms which included Chest Pain, Stomach pain, Headache.  2. The overall quality of the study is good. There is no evidence of abnormal lung activity. Stress and rest SPECT images demonstrate homogeneous tracer  distribution throughout the myocardium. Gated SPECT imaging reveals diffuse global hypokinesis. The left ventricular ejection fraction was markedly depressed at (26%).  3. This is a high risk study due to severe LV systolic dysfunction. Findings may represent non ischemic cardiomyopathy.  Echocardiogram 03/10/2020:  Left ventricle cavity is normal in size. Moderate concentric hypertrophy of the left ventricle. Severe global hypokinesis. LVEF 15-20%. Doppler evidence of grade II (pseudonormal) diastolic dysfunction, elevated LAP.  The aortic root is mildly dilated 3.9 cm.  Left atrial cavity is severely dilated at 65 cc/m2.  Trileaflet aortic valve. Moderate (Grade II) aortic regurgitation.  Moderate (Grade III) mitral regurgitation.  Mild tricuspid regurgitation. Estimated pulmonary artery systolic pressure 38 mmHg.  Mild pulmonic regurgitation.  Previous study on 07/30/2018, EF 40% (reported normal)    Assessment & Recommendations:  84 y/o Caucasian female with paroxysmal Afib, HFrEF, admitted with UTI and acute on chronic HFrEF  HFrEF: In spite of her severely reduced LVEF, she appears relatively volume, or volume depleted.  I suspect this may be related to her UTI and medications.  She is back in A. fib RVR this morning, which is also contributing to her hypotension this morning.  Recommend holding all blood pressure/chronic heart failure medications.  Resume IV amiodarone.  Gave IV bolus to 50+ to 50 cc.  Pressure is improved with this.  Will check lactic acid.  Low suspicion for cardiogenic shock at this time.  We will continue to monitor.  Atrial fibrillation: Paroxysmal, back in RVR this morning.  Will use IV amiodarone, in addition to p.o. amiodarone 200 mg daily. CHA2DS2-VASc Score is5. Yearly risk of stroke: 7.2% Continue anticoagulation with Eliquis, at 2.5 mg bid  UTI: Management as per primary team.   Nigel Mormon, MD Pager: [phone] Office:  [phone]

## 2020-03-25 NOTE — Significant Event (Signed)
Rapid Response Event Note   Reason for Call :  Afib RVR, hypotension BP 65/47  Initial Focused Assessment:  Pt lying in bed. Alert, oriented and follows commands. Pt is warm, moist to touch. She does not appear distressed, but states she doesn't feel well. She endorses dizziness this morning. Mild JVD noted. Lung sounds are clear. Heart rate is fast, irregular. Radial pulse is 2+. No edema noted. Pt denies pain and is afebrile.  VS: T 97.74F, BP 65/47, HR 136, RR 16, SpO2 96% on room air  Interventions:  -250 cc bolus started -150 mg Amio bolus  Plan of Care:  1013:   BP 86/55, HR 82 following Amio bolus. Pt alert, oriented. Denies pain.  -Continuous telemetry -VS per MEWS protocol  Event Summary:  MD Notified: Dr. Louanne Belton Call Time: [id number] Arrival Time: 0815 End Time: [place], RN

## 2020-03-25 NOTE — Progress Notes (Signed)
PHARMACY NOTE:  ANTIMICROBIAL RENAL DOSAGE ADJUSTMENT  Current antimicrobial regimen includes a mismatch between antimicrobial dosage and estimated renal function.  As per policy approved by the Pharmacy & Therapeutics and Medical Executive Committees, the antimicrobial dosage will be adjusted accordingly.  Current antimicrobial dosage:  Cefazolin 2g IV q8h  Indication: bacteremia  Renal Function:  Estimated Creatinine Clearance: 30.5 mL/min (A) (by C-G formula based on SCr of 1.25 mg/dL (H)). []      On intermittent HD, scheduled: []      On CRRT    Antimicrobial dosage has been changed to:  Cefazolin 2g IV q12h  Additional comments:   Thank you for allowing pharmacy to be a part of this patient's care.  Arrie Senate, PharmD, BCPS Clinical Pharmacist [phone] Please check AMION for all [place] numbers 03/25/2020

## 2020-03-25 NOTE — Progress Notes (Signed)
Heart Failure Stewardship Pharmacist Progress Note   PCP: [name], MD PCP-Cardiologist: No primary care provider on file.    HPI:  84 yo F with PMH of hypothyroidism, HTN, afib with RVR, and CHF presenting to [facility] ED on 03/21/20 with afib RVR. She was cardioverted in the ED but returned to afib. She is also receiving ceftriaxone for ecoli bacteremia and UTI. Last ECHO done on 03/11/20 and showed LVEF of 15-20% with moderate aortic and mitral regurgitation. This morning, patient is back in afib RVR and hypotensive. Repeat bolus of IV amio given. Antibiotics have been switched to cefazolin.  Current HF Medications: Lisabeth Register, metoprolol all on hold  Prior to admission HF Medications: Furosemide 40 mg BID Metoprolol 25 mg BID  Pertinent Lab Values: . Serum creatinine 1.25, BUN 30, Potassium 3.7, Sodium 137, BNP 1091.1, Magnesium 2.4  Vital Signs: . Weight: 131 lbs (admission weight: 134 lbs) . Blood pressure: 120/60s >> 76/44 this AM . Heart rate: 80s  Medication Assistance / Insurance Benefits Check: Does the patient have prescription insurance?  Yes Type of insurance plan: Medicare - [employer]  Does the patient qualify for medication assistance through manufacturers or grants?   Pending . Eligible grants and/or patient assistance programs: pending . Medication assistance applications in progress: none  . Medication assistance applications approved: none Approved medication assistance renewals will be completed by: Dr. Irven Shelling office  Outpatient Pharmacy:  Prior to admission outpatient pharmacy: [facility] Is the patient willing to use [facility] at discharge? Yes Is the patient willing to transition their outpatient pharmacy to utilize a [facility] outpatient pharmacy?   Pending    Assessment: 1. Acute on chronic systolic CHF (EF 71-16%). NYHA class II symptoms. Back in afib with RVR this AM and hypotensive.  - After discussing with Dr.  [name], plan to continue furosemide PRN at discharge - Metoprolol, Entresto, and Farxiga all on hold given hypotension   Plan: 1) Medication changes recommended at this time: - None at this time  2) Patient assistance - Prior authorization for Delene Loll and Farxiga completed and approved - Entresto copay $61.87 - Farxiga copay $94.65 - Will discuss options for patient assistance through drug manufacturers if copay is too high for her  3)  Education  - To be completed prior to discharge  Kerby Nora, PharmD, BCPS Heart Failure Stewardship Pharmacist Phone [phone]

## 2020-03-25 NOTE — Evaluation (Signed)
Occupational Therapy Evaluation Patient Details Name: Heidi Adams MRN: [id number] DOB: Feb 07, 1936 Today's Date: 03/25/2020    History of Present Illness 84 year old female who presents with complaint of shortness of breath, in addition to heart palpitations, left sided intermittent chest pain and lightheadedness and dizziness for the past 2 to 3 days. In ED pt tachycardic HR in 150s, with chest pain, and fever of 102. Pt cardioverted and HR in 80s. Admitted 03/21/20 for treatment of UTI.   Clinical Impression   Limited evaluation with pt's BP dropping from 86/55 in supine to 69/51 with HOB elevated. Pt reporting dizziness. Encouraged pt to perform AROM of extremities and to work on progressing HOB up according to symptoms. Pt with minimal verbalization, but oriented and following commands well,  needs further cognitive evaluation. Pt with generalized weakness, L wrist and R shoulder pain. She currently requires mod to total assist for ADL. Will follow acutely to facilitate pt returning to her baseline independence in self care.     Follow Up Recommendations  Home health OT;Supervision/Assistance - 24 hour    Equipment Recommendations  None recommended by OT    Recommendations for Other Services       Precautions / Restrictions Precautions Precautions: Fall Precaution Comments: orthostatic hypotension       Mobility Bed Mobility               General bed mobility comments: elevated HOB with pt experiencing dizziness and BP dropping, encouraged pt to roll and perform UE and LE AROM at bed level, instructed pt in how to use bed controls to elevate and lower her head at will  Transfers                 General transfer comment: deferred due to orthostatic hypotension     Balance                                           ADL either performed or assessed with clinical judgement   ADL Overall ADL's : Needs assistance/impaired Eating/Feeding: Moderate  assistance;Bed level Eating/Feeding Details (indicate cue type and reason): for drinking Grooming: Wash/dry face;Bed level;Set up   Upper Body Bathing: Maximal assistance;Bed level   Lower Body Bathing: Total assistance;Bed level   Upper Body Dressing : Minimal assistance;Bed level   Lower Body Dressing: Total assistance;Bed level                 General ADL Comments: Pt orthostatic with dizziness in supine, unable to progress to EOB.     Vision Baseline Vision/History: Wears glasses Wears Glasses: Reading only Additional Comments: reports her vision is "terrible" and she recently got new glasses     Perception     Praxis      Pertinent Vitals/Pain Pain Assessment: Faces Faces Pain Scale: Hurts even more Pain Location: L shoulder, R wrist Pain Descriptors / Indicators: Moaning;Grimacing Pain Intervention(s): Monitored during session;Repositioned     Hand Dominance Right   Extremity/Trunk Assessment Upper Extremity Assessment Upper Extremity Assessment: RUE deficits/detail;LUE deficits/detail;Generalized weakness RUE Deficits / Details: R wrist deformity and pain RUE Coordination: decreased fine motor;decreased gross motor LUE Deficits / Details: shoulder crepitus, pain LUE Coordination: decreased gross motor   Lower Extremity Assessment Lower Extremity Assessment: Defer to PT evaluation   Cervical / Trunk Assessment Cervical / Trunk Assessment: Kyphotic   Communication Communication Communication: No difficulties  Cognition Arousal/Alertness: Awake/alert Behavior During Therapy: Flat affect Overall Cognitive Status: Within Functional Limits for tasks assessed                                 General Comments: pt minimally conversive, decreased task persistence, did not recall eating breakfast, needs further assessment    General Comments       Exercises     Shoulder Instructions      Home Living Family/patient expects to be  discharged to:: Private residence Living Arrangements: Spouse/significant other Available Help at Discharge: Family;Available 24 hours/day Type of Home: House Home Access: Stairs to enter [employer] of Steps: 1 Entrance Stairs-Rails: None Home Layout: One level     Bathroom Shower/Tub: Occupational psychologist: Standard (with 3 in 1 over)     Home Equipment: Clinical cytogeneticist - 2 wheels;Bedside commode          Prior Functioning/Environment Level of Independence: Needs assistance  Gait / Transfers Assistance Needed: needs support from husband for ambulation, or furniture walks ADL's / Homemaking Assistance Needed: independent in bathing and dressing, med management and paying bills, husband does iADLs            OT Problem List: Decreased strength;Decreased activity tolerance;Cardiopulmonary status limiting activity;Impaired UE functional use;Pain      OT Treatment/Interventions: Self-care/ADL training;DME and/or AE instruction;Therapeutic activities;Patient/family education;Balance training;Therapeutic exercise    OT Goals(Current goals can be found in the care plan section) Acute Rehab OT Goals Patient Stated Goal: be able to go home OT Goal Formulation: With patient Time For Goal Achievement: 04/08/20 Potential to Achieve Goals: Good ADL Goals Pt Will Perform Grooming: with min guard assist;standing Pt Will Perform Upper Body Dressing: with set-up Pt Will Perform Lower Body Dressing: with min assist;sit to/from stand Pt Will Transfer to Toilet: with min guard assist;ambulating;bedside commode Pt Will Perform Toileting - Clothing Manipulation and hygiene: with min guard assist;sit to/from stand Additional ADL Goal #1: Pt will perform bed mobility with supervision in preparation for ADL.  OT Frequency: Min 2X/week   Barriers to D/C:            Co-evaluation              AM-PAC OT "6 Clicks" Daily Activity     Outcome Measure Help  from another person eating meals?: A Lot Help from another person taking care of personal grooming?: A Little Help from another person toileting, which includes using toliet, bedpan, or urinal?: Total Help from another person bathing (including washing, rinsing, drying)?: A Lot Help from another person to put on and taking off regular upper body clothing?: A Little Help from another person to put on and taking off regular lower body clothing?: Total 6 Click Score: 12   End of Session Nurse Communication: Other (comment) (aware of pt's BP)  Activity Tolerance: Treatment limited secondary to medical complications (Comment) (orthostatic) Patient left: in bed;with call bell/phone within reach;with family/visitor present  OT Visit Diagnosis: Muscle weakness (generalized) (M62.81);Pain                Time: [id number] OT Time Calculation (min): 28 min Charges:  OT General Charges $OT Visit: 1 Visit OT Evaluation $OT Eval Moderate Complexity: 1 Mod OT Treatments $Therapeutic Activity: 8-22 mins  Nestor Lewandowsky, OTR/L Acute Rehabilitation Services Pager: [phone] Office: [phone]  Malka So 03/25/2020, 11:33 AM

## 2020-03-25 NOTE — Progress Notes (Addendum)
Received pt Afib RVR.  EKG is done and paged Dr. Louanne Belton.  A/O and responsive. BP started dropping from SBP 90s to 60s. Rapid response called.  Also received order to start bolus from Dr. Louanne Belton.  Dr. Parke Poisson made aware. Received order for 250 ml bolus x2 and Amiodarone bolus. Hold off metoprolol and entresto.  Idolina Primer, RN

## 2020-03-25 NOTE — Progress Notes (Signed)
PROGRESS NOTE  Heidi Adams:[id number] DOB: 03-24-1936 DOA: 03/21/2020 PCP: [name], MD   LOS: 2 days   Brief narrative: As per HPI,  Patient  is a 84 year old female with past medical history of hypothyroidism, hypertension, atrial fibrillation with RVR congestive heart failure presented to the hospital with shortness of breath for 2 to 3 days with palpitation dizziness lightheadedness and sweating with chest discomfort.  In the ER, the patient was tachycardic, heart rate up to 150s.  She also complained of chest pain.  She was also noted to have a fever fever of 102.  The ER physician discussed the situation with the cardiologist Dr. [name], the decision was made to cardiovert the patient.  She tolerated the procedure well.  Post cardioversion patient was admitted to the hospital for further observation.  Patient was started on amiodarone Entresto during hospitalization but subsequently had hypotension and A. fib with RVR despite being on amiodarone.  Has been started on amiodarone drip since 03/25/2020.  Assessment/Plan:  Principal Problem:   Atrial fibrillation with RVR (HCC) Active Problems:   Hypothyroidism   Essential hypertension   Pharyngoesophageal dysphagia   Malignant neoplasm of upper-inner quadrant of left breast in female, estrogen receptor positive (Heidi Adams)   Acute cystitis   History of congestive heart failure   E coli bacteremia   A-fib (HCC)  Paroxysmal atrial fibrillation with RVR She was cardioverted in the ER.Has been started on amiodarone but patient does have a A. fib with RVR today with hypotension.  Rapid response at bedside.  Spoke with cardiology at bedside.  Plan is to start the patient on amiodarone drip.  Patient will receive IV fluid bolus for orthostatic hypotension.  Continue to hold metoprolol Entresto at this time.  Further management as per cardiology team.  We will continue to monitor the patient.  Spoke with the patient's husband at bedside.  Mild  orthostatic hypotension yesterday.  Patient was hypotensive today with RVR.  Continue amiodarone drip NPO.    History of chronic systolic congestive heart failure.  2D echocardiogram on 03/11/2020 showed LV ejection fraction of 15 to 20% with moderate aortic and mitral regurgitation.  Hold  Entresto, metoprolol, farxiga. Patient follows up with Dr. [name] cardiology as outpatient.  Patient was on metoprolol and Lasix as outpatient. Patient will likely need home health on discharge including nursing for medication management. May consider Lasix as needed on discharge.  Chronic nocturnal use of oxygen 2L likely secondary to CHF.  Will continue while in the hospital.   Essential hypertension Holding metoprolol.  Heidi Adams has been added 03/23/2000 but will hold for now..  Currently hypotensive with RVR.   Hypothyroidism Continue Synthroid.  Acute E. coli cystitis with E. coli bacteremia. On IV cefazolin as per sensitivity.  Urine culture showing E. coli.  Blood cultures with E. coli bacteremia.   Hypokalemia improved with replacement.  Potassium 3.7 today.  Mild renal insufficiency .  Creatinine of 1.25.  Closely monitor.  Debility, deconditioning.  Will ambulate the patient in hall as able.  DVT prophylaxis:  apixaban (ELIQUIS) tablet 2.5 mg    Code Status: Full code  Family Communication: I spoke with the patient's husband at bedside  Status is: Inpatient  The patient will require  inpatient because:  Inpatient level of care appropriate due to severity of illness, status post cardioversion, A. fib with RVR with hypotension on amiodarone drip, , E. coli bacteremia, orthostatic hypotension  Dispo: The patient is from: Home  Anticipated d/c is to: Home              Anticipated d/c date is: 2 to 3 days              Patient currently is not medically stable to d/c.  Patient is at a high risk of deterioration due to severe cardiomyopathy with atrial fibrillation with rapid  ventricular response, hypotension and E. coli bacteremia.  Consultants:  Cardiology Dr. [name]  Procedures:  DC cardioversion  Antibiotics:  . Rocephin IV 8/28> 03/23/2020 . Cefazolin 03/24/2020>  Subjective: Today, patient was seen and examined at bedside.  Rapid response for hypotension with tachycardia.  Patient stated that she was dizzy lightheaded and did not feel well.  Denied overt chest pain.   Objective: Vitals:   03/25/20 0500 03/25/20 0820  BP: 127/67 (!) 65/47  Pulse: 66   Resp: 17 16  Temp: (!) 97.5 F (36.4 C)   SpO2:      Intake/Output Summary (Last 24 hours) at 03/25/2020 1009 Last data filed at 03/25/2020 0800 Gross per 24 hour  Intake 100 ml  Output 1100 ml  Net -1000 ml   Filed Weights   03/23/20 0341 03/24/20 0500 03/25/20 0500  Weight: 62.8 kg 60.3 kg 59.5 kg   Body mass index is 22.52 kg/m.   Physical Exam:  GENERAL: Patient is alert awake and oriented. Not in obvious distress.  Thinly built, on nasal cannula oxygen, frail appearing HENT: No scleral pallor or icterus. Pupils equally reactive to light. Oral mucosa is moist NECK: is supple, no gross swelling noted. CHEST:  Diminished breath sounds bilaterally. CVS: S1 and S2 heard, systolic murmur, irregularly irregular rhythm with tachycardia ABDOMEN: Soft, non-tender, bowel sounds are present. EXTREMITIES: Trace edema noted CNS: Cranial nerves are intact. No focal motor deficits. SKIN: warm and dry without rashes.  Data Review: I have personally reviewed the following laboratory data and studies,  CBC: Recent Labs  Lab 03/20/20 1043 03/21/20 2116 03/21/20 2330 03/23/20 0239 03/24/20 0339 03/25/20 0452  WBC 12.7* 19.5*  --  17.3* 16.1* 14.5*  HGB 8.7* 9.1* 8.8* 7.9* 8.8* 9.9*  HCT 28.7* 30.5* 26.0* 26.7* 30.1* 34.1*  MCV 75* 79.0*  --  79.0* 78.0* 76.8*  PLT 399 427*  --  375 458* 161*   Basic Metabolic Panel: Recent Labs  Lab 03/20/20 1043 03/20/20 1043 03/21/20 2116  03/21/20 2330 03/23/20 0239 03/24/20 0339 03/25/20 0452  NA 136  --  134* 135 137 138 137  K 3.8   < > 3.8 3.4* 3.8 3.9 3.7  CL 97   < > 98 97* 99 101 101  CO2 23  --  22  --  27 26 26   GLUCOSE 108*  --  126* 122* 104* 106* 112*  BUN 24  --  31* 28* 29* 27* 30*  CREATININE 0.96   < > 1.10* 1.10* 1.09* 1.00 1.25*  CALCIUM 8.8  --  8.4*  --  8.3* 8.3* 8.2*  MG 2.1  --  1.8  --  2.0 2.2 2.4  PHOS  --   --   --   --  2.8  --   --    < > = values in this interval not displayed.   Liver Function Tests: Recent Labs  Lab 03/20/20 1043 03/21/20 2116 03/23/20 0239 03/24/20 0339  AST 38 37 26 31  ALT 22 33 27 28  ALKPHOS 89 80 80 79  BILITOT 0.5 0.4 0.3 0.5  PROT 5.9* 5.8* 5.3* 5.1*  ALBUMIN 3.5* 2.5* 2.2* 2.1*   No results for input(s): LIPASE, AMYLASE in the last 168 hours. No results for input(s): AMMONIA in the last 168 hours. Cardiac Enzymes: No results for input(s): CKTOTAL, CKMB, CKMBINDEX, TROPONINI in the last 168 hours. BNP (last 3 results) Recent Labs    03/20/20 1043 03/21/20 2116  BNP 874.3* 1,091.1*    ProBNP (last 3 results) No results for input(s): PROBNP in the last 8760 hours.  CBG: Recent Labs  Lab 03/21/20 2120  GLUCAP 132*   Recent Results (from the past 240 hour(s))  SARS CORONAVIRUS 2 (TAT 6-24 HRS) Nasopharyngeal Nasopharyngeal Swab     Status: None   Collection Time: 03/20/20 11:33 AM   Specimen: Nasopharyngeal Swab  Result Value Ref Range Status   SARS Coronavirus 2 NEGATIVE NEGATIVE Final    Comment: (NOTE) SARS-CoV-2 target nucleic acids are NOT DETECTED.  The SARS-CoV-2 RNA is generally detectable in upper and lower respiratory specimens during the acute phase of infection. Negative results do not preclude SARS-CoV-2 infection, do not rule out co-infections with other pathogens, and should not be used as the sole basis for treatment or other patient management decisions. Negative results must be combined with clinical  observations, patient history, and epidemiological information. The expected result is Negative.  Fact Sheet for Patients: SugarRoll.be  Fact Sheet for Healthcare Providers: https://www.woods-mathews.com/  This test is not yet approved or cleared by the [place] FDA and  has been authorized for detection and/or diagnosis of SARS-CoV-2 by FDA under an Emergency Use Authorization (EUA). This EUA will remain  in effect (meaning this test can be used) for the duration of the COVID-19 declaration under Se ction 564(b)(1) of the Act, 21 U.S.C. section 360bbb-3(b)(1), unless the authorization is terminated or revoked sooner.  Performed at [facility], [place] [address], [place], [place] [address]   SARS Coronavirus 2 by RT PCR (hospital order, performed in [facility] hospital lab) Nasopharyngeal Nasopharyngeal Swab     Status: None   Collection Time: 03/21/20  9:43 PM   Specimen: Nasopharyngeal Swab  Result Value Ref Range Status   SARS Coronavirus 2 NEGATIVE NEGATIVE Final    Comment: (NOTE) SARS-CoV-2 target nucleic acids are NOT DETECTED.  The SARS-CoV-2 RNA is generally detectable in upper and lower respiratory specimens during the acute phase of infection. The lowest concentration of SARS-CoV-2 viral copies this assay can detect is 250 copies / mL. A negative result does not preclude SARS-CoV-2 infection and should not be used as the sole basis for treatment or other patient management decisions.  A negative result may occur with improper specimen collection / handling, submission of specimen other than nasopharyngeal swab, presence of viral mutation(s) within the areas targeted by this assay, and inadequate number of viral copies (<250 copies / mL). A negative result must be combined with clinical observations, patient history, and epidemiological information.  Fact Sheet for Patients:    StrictlyIdeas.no  Fact Sheet for Healthcare Providers: BankingDealers.co.za  This test is not yet approved or  cleared by the [place] FDA and has been authorized for detection and/or diagnosis of SARS-CoV-2 by FDA under an Emergency Use Authorization (EUA).  This EUA will remain in effect (meaning this test can be used) for the duration of the COVID-19 declaration under Section 564(b)(1) of the Act, 21 U.S.C. section 360bbb-3(b)(1), unless the authorization is terminated or revoked sooner.  Performed at [facility], [place] [address],  [place], [place] [address]   Blood Culture (routine x 2)     Status: None (Preliminary result)   Collection Time: 03/21/20  9:48 PM   Specimen: BLOOD  Result Value Ref Range Status   Specimen Description BLOOD SITE NOT SPECIFIED  Final   Special Requests   Final    BOTTLES DRAWN AEROBIC AND ANAEROBIC Blood Culture results may not be optimal due to an inadequate volume of blood received in culture bottles   Culture   Final    NO GROWTH 4 DAYS Performed at [facility], [place] [address], [place], [place] [address]    Report Status PENDING  Incomplete  Urine culture     Status: Abnormal   Collection Time: 03/21/20 10:43 PM   Specimen: In/Out Cath Urine  Result Value Ref Range Status   Specimen Description IN/OUT CATH URINE  Final   Special Requests   Final    NONE Performed at [facility], [place] [address], [place], [place] [address]    Culture >=100,000 COLONIES/mL ESCHERICHIA COLI (A)  Final   Report Status 03/24/2020 FINAL  Final   Organism ID, Bacteria ESCHERICHIA COLI (A)  Final      Susceptibility   Escherichia coli - MIC*    AMPICILLIN >=32 RESISTANT Resistant     CEFAZOLIN <=4 SENSITIVE Sensitive     CEFTRIAXONE <=0.25 SENSITIVE Sensitive     CIPROFLOXACIN <=0.25 SENSITIVE Sensitive     GENTAMICIN <=1 SENSITIVE Sensitive     IMIPENEM <=0.25 SENSITIVE Sensitive      NITROFURANTOIN <=16 SENSITIVE Sensitive     TRIMETH/SULFA <=20 SENSITIVE Sensitive     AMPICILLIN/SULBACTAM >=32 RESISTANT Resistant     PIP/TAZO <=4 SENSITIVE Sensitive     * >=100,000 COLONIES/mL ESCHERICHIA COLI  Blood Culture (routine x 2)     Status: Abnormal   Collection Time: 03/21/20 11:49 PM   Specimen: BLOOD LEFT HAND  Result Value Ref Range Status   Specimen Description BLOOD LEFT HAND  Final   Special Requests   Final    BOTTLES DRAWN AEROBIC AND ANAEROBIC Blood Culture adequate volume   Culture  Setup Time   Final    GRAM NEGATIVE RODS IN BOTH AEROBIC AND ANAEROBIC BOTTLES Organism ID to follow CRITICAL RESULT CALLED TO, READ BACK BY AND VERIFIED WITHCatalina Lunger PHARMD, AT 1510 03/22/20 BY D. VANHOOK Performed at [facility], [place] [address], [place], [place] [address]    Culture ESCHERICHIA COLI (A)  Final   Report Status 03/24/2020 FINAL  Final   Organism ID, Bacteria ESCHERICHIA COLI  Final      Susceptibility   Escherichia coli - MIC*    AMPICILLIN >=32 RESISTANT Resistant     CEFAZOLIN <=4 SENSITIVE Sensitive     CEFEPIME <=0.12 SENSITIVE Sensitive     CEFTAZIDIME <=1 SENSITIVE Sensitive     CEFTRIAXONE <=0.25 SENSITIVE Sensitive     CIPROFLOXACIN <=0.25 SENSITIVE Sensitive     GENTAMICIN <=1 SENSITIVE Sensitive     IMIPENEM <=0.25 SENSITIVE Sensitive     TRIMETH/SULFA <=20 SENSITIVE Sensitive     AMPICILLIN/SULBACTAM >=32 RESISTANT Resistant     PIP/TAZO <=4 SENSITIVE Sensitive     * ESCHERICHIA COLI  Blood Culture ID Panel (Reflexed)     Status: Abnormal   Collection Time: 03/21/20 11:49 PM  Result Value Ref Range Status   Enterococcus faecalis NOT DETECTED NOT DETECTED Final   Enterococcus Faecium NOT DETECTED NOT DETECTED Final   Listeria monocytogenes  NOT DETECTED NOT DETECTED Final   Staphylococcus species NOT DETECTED NOT DETECTED Final   Staphylococcus aureus (BCID) NOT DETECTED NOT DETECTED Final   Staphylococcus epidermidis NOT DETECTED  NOT DETECTED Final   Staphylococcus lugdunensis NOT DETECTED NOT DETECTED Final   Streptococcus species NOT DETECTED NOT DETECTED Final   Streptococcus agalactiae NOT DETECTED NOT DETECTED Final   Streptococcus pneumoniae NOT DETECTED NOT DETECTED Final   Streptococcus pyogenes NOT DETECTED NOT DETECTED Final   A.calcoaceticus-baumannii NOT DETECTED NOT DETECTED Final   Bacteroides fragilis NOT DETECTED NOT DETECTED Final   Enterobacterales DETECTED (A) NOT DETECTED Final    Comment: Enterobacterales represent a large order of gram negative bacteria, not a single organism. CRITICAL RESULT CALLED TO, READ BACK BY AND VERIFIED WITH: H. VON DOHLEN PHARMD, AT 1510 03/22/20 BY D. VANHOOK    Enterobacter cloacae complex NOT DETECTED NOT DETECTED Final   Escherichia coli DETECTED (A) NOT DETECTED Final    Comment: CRITICAL RESULT CALLED TO, READ BACK BY AND VERIFIED WITH: H. VON DOHLEN PHARMD, AT 1510 03/22/20 BY D. VANHOOK    Klebsiella aerogenes NOT DETECTED NOT DETECTED Final   Klebsiella oxytoca NOT DETECTED NOT DETECTED Final   Klebsiella pneumoniae NOT DETECTED NOT DETECTED Final   Proteus species NOT DETECTED NOT DETECTED Final   Salmonella species NOT DETECTED NOT DETECTED Final   Serratia marcescens NOT DETECTED NOT DETECTED Final   Haemophilus influenzae NOT DETECTED NOT DETECTED Final   Neisseria meningitidis NOT DETECTED NOT DETECTED Final   Pseudomonas aeruginosa NOT DETECTED NOT DETECTED Final   Stenotrophomonas maltophilia NOT DETECTED NOT DETECTED Final   Candida albicans NOT DETECTED NOT DETECTED Final   Candida auris NOT DETECTED NOT DETECTED Final   Candida glabrata NOT DETECTED NOT DETECTED Final   Candida krusei NOT DETECTED NOT DETECTED Final   Candida parapsilosis NOT DETECTED NOT DETECTED Final   Candida tropicalis NOT DETECTED NOT DETECTED Final   Cryptococcus neoformans/gattii NOT DETECTED NOT DETECTED Final   CTX-M ESBL NOT DETECTED NOT DETECTED Final    Carbapenem resistance IMP NOT DETECTED NOT DETECTED Final   Carbapenem resistance KPC NOT DETECTED NOT DETECTED Final   Carbapenem resistance NDM NOT DETECTED NOT DETECTED Final   Carbapenem resist OXA 48 LIKE NOT DETECTED NOT DETECTED Final   Carbapenem resistance VIM NOT DETECTED NOT DETECTED Final    Comment: Performed at [facility], [address] [place], [place] [address]     Studies: No results found.    Flora Lipps, MD  [facility] 03/25/2020

## 2020-03-26 ENCOUNTER — Ambulatory Visit: Payer: PPO | Admitting: Cardiology

## 2020-03-26 LAB — BASIC METABOLIC PANEL
Anion gap: 10 (ref 5–15)
BUN: 25 mg/dL — ABNORMAL HIGH (ref 8–23)
CO2: 26 mmol/L (ref 22–32)
Calcium: 8 mg/dL — ABNORMAL LOW (ref 8.9–10.3)
Chloride: 103 mmol/L (ref 98–111)
Creatinine, Ser: 1.25 mg/dL — ABNORMAL HIGH (ref 0.44–1.00)
GFR calc Af Amer: 47 mL/min — ABNORMAL LOW (ref >60)
GFR calc non Af Amer: 40 mL/min — ABNORMAL LOW (ref >60)
Glucose, Bld: 93 mg/dL (ref 70–99)
Potassium: 3.7 mmol/L (ref 3.5–5.1)
Sodium: 139 mmol/L (ref 135–145)

## 2020-03-26 LAB — CBC
HCT: 33.5 % — ABNORMAL LOW (ref 36.0–46.0)
Hemoglobin: 10 g/dL — ABNORMAL LOW (ref 12.0–15.0)
MCH: 23.1 pg — ABNORMAL LOW (ref 26.0–34.0)
MCHC: 29.9 g/dL — ABNORMAL LOW (ref 30.0–36.0)
MCV: 77.5 fL — ABNORMAL LOW (ref 80.0–100.0)
Platelets: 546 10*3/uL — ABNORMAL HIGH (ref 150–400)
RBC: 4.32 MIL/uL (ref 3.87–5.11)
RDW: 17.1 % — ABNORMAL HIGH (ref 11.5–15.5)
WBC: 14 10*3/uL — ABNORMAL HIGH (ref 4.0–10.5)
nRBC: 0 % (ref 0.0–0.2)

## 2020-03-26 LAB — CULTURE, BLOOD (ROUTINE X 2): Culture: NO GROWTH

## 2020-03-26 LAB — MAGNESIUM: Magnesium: 2.4 mg/dL (ref 1.7–2.4)

## 2020-03-26 MED ORDER — CEFAZOLIN SODIUM-DEXTROSE 2-4 GM/100ML-% IV SOLN
2.0000 g | Freq: Three times a day (TID) | INTRAVENOUS | Status: DC
  Filled 2020-03-26: qty 100

## 2020-03-26 MED ORDER — SODIUM CHLORIDE 0.9 % IV SOLN: INTRAVENOUS | Status: AC

## 2020-03-26 MED ORDER — CEFAZOLIN SODIUM-DEXTROSE 2-4 GM/100ML-% IV SOLN
2.0000 g | Freq: Three times a day (TID) | INTRAVENOUS | Status: DC
  Administered 2020-03-26 – 2020-03-27 (×5): 2 g via INTRAVENOUS
  Filled 2020-03-26 (×7): qty 100

## 2020-03-26 NOTE — Progress Notes (Signed)
Physical Therapy Treatment Patient Details Name: Heidi Adams MRN: [id number] DOB: October 05, 1935 Today's Date: 03/26/2020    History of Present Illness 84 year old female who presents with complaint of shortness of breath, in addition to heart palpitations, left sided intermittent chest pain and lightheadedness and dizziness for the past 2 to 3 days. In ED pt tachycardic HR in 150s, with chest pain, and fever of 102. Pt cardioverted and HR in 80s. Admitted 03/21/20 for treatment of UTI.    PT Comments    Patient resistant to vestibular eval this pm due to seems overwhelmed with other medical issues going on right now and not wanting to "rock the boat" with vestibular testing.  Encouraged her it may solve one of her problems even if the treatment is not desirable.  She did allow me to test her for positional vertigo in the bed, but did not note any nystagmus with testing this pm.  Patient will benefit from continued vestibular follow up to progress mobility tolerance.  PT to follow up.    Follow Up Recommendations  Home health PT;Supervision/Assistance - 24 hour     Equipment Recommendations  None recommended by PT    Recommendations for Other Services       Precautions / Restrictions Precautions Precautions: Fall    Mobility  Bed Mobility               General bed mobility comments: patient not interested in positional testing at EOB so performed in bed using trendelenberg for testing  Transfers                    Ambulation/Gait                 Stairs             Wheelchair Mobility    Modified Rankin (Stroke Patients Only)       Balance                                            Cognition Arousal/Alertness: Awake/alert Behavior During Therapy: Flat affect;Anxious Overall Cognitive Status: Within Functional Limits for tasks assessed                                        Exercises      General  Comments General comments (skin integrity, edema, etc.): Completed supine positional testing without noted symptoms this session.  Reports she is too overwhelemed with other medical issues right now to work on vertigo though she did let me do some positional testing in the bed  Vestibular Assessment - 03/26/20 1710      Symptom Behavior   Subjective history of current problem Patient reports had work up and treatment for vertigo 2-3 years ago and reports went throught [facility] for treatment and "they taught my husband hout to do it".  However reports she feels this is a chronic problem..    Type of Dizziness  Spinning    Frequency of Dizziness intermittent    Duration of Dizziness lasts till I turn my head away from the R    Symptom Nature Positional    Aggravating Factors Turning head sideways   to the R   Relieving Factors Comments   moving head back from R  Progression of Symptoms Better    History of similar episodes yes as noted above      Positional Testing   Dix-Hallpike Dix-Hallpike Right;Dix-Hallpike Left    Sidelying Test --    Horizontal Canal Testing Horizontal Canal Right;Horizontal Canal Left      Dix-Hallpike Right   Dix-Hallpike Right Duration 30    Dix-Hallpike Right Symptoms No nystagmus   but felt like symptoms might start     Dix-Hallpike Left   Dix-Hallpike Left Duration 30    Dix-Hallpike Left Symptoms No nystagmus      Horizontal Canal Right   Horizontal Canal Right Duration 30    Horizontal Canal Right Symptoms Normal      Horizontal Canal Left   Horizontal Canal Left Duration 30    Horizontal Canal Left Symptoms Normal              Pertinent Vitals/Pain Pain Assessment: Faces Faces Pain Scale: Hurts little more Pain Location: R wrist, L elbow Pain Descriptors / Indicators: Aching Pain Intervention(s): Monitored during session;Repositioned    Home Living                      Prior Function            PT Goals (current goals can now be  found in the care plan section) Progress towards PT goals: Not progressing toward goals - comment    Frequency    Min 3X/week      PT Plan Current plan remains appropriate    Co-evaluation              AM-PAC PT "6 Clicks" Mobility   Outcome Measure  Help needed turning from your back to your side while in a flat bed without using bedrails?: A Little Help needed moving from lying on your back to sitting on the side of a flat bed without using bedrails?: A Lot Help needed moving to and from a bed to a chair (including a wheelchair)?: Total Help needed standing up from a chair using your arms (e.g., wheelchair or bedside chair)?: Total Help needed to walk in hospital room?: Total Help needed climbing 3-5 steps with a railing? : Total 6 Click Score: 9    End of Session   Activity Tolerance: Other (comment) (self limited) Patient left: in bed;with call bell/phone within reach;with family/visitor present   PT Visit Diagnosis: Other abnormalities of gait and mobility (R26.89);Muscle weakness (generalized) (M62.81);Difficulty in walking, not elsewhere classified (R26.2);Dizziness and giddiness (R42);Pain     Time: 5830-9407 PT Time Calculation (min) (ACUTE ONLY): 18 min  Charges:  $Neuromuscular Re-education: 8-22 mins                     Magda Kiel, PT Acute Rehabilitation Services WKGSU:[phone] Office:[phone] 03/26/2020    Heidi Adams 03/26/2020, 5:19 PM

## 2020-03-26 NOTE — Progress Notes (Signed)
Physical Therapy Treatment Patient Details Name: Heidi Adams MRN: [id number] DOB: Nov 26, 1935 Today's Date: 03/26/2020    History of Present Illness 84 year old female who presents with complaint of shortness of breath, in addition to heart palpitations, left sided intermittent chest pain and lightheadedness and dizziness for the past 2 to 3 days. In ED pt tachycardic HR in 150s, with chest pain, and fever of 102. Pt cardioverted and HR in 80s. Admitted 03/21/20 for treatment of UTI.    PT Comments    Pt eager to work with therapy to progress towards d/c home. Pt limited in safe mobility however, with increased dizziness with positional change not related to orthostatic hypotension (see values below). PT performed vestibular screen and pt noted to have nystagmus and increase in symptoms with finger follow. Pt reports history of vertigo and success with vestibular PT. Requested order for eval and treatment. Pt continues to be modA for coming to sitting, min A for sit>stand and modA for stepping towards HoB. D/c plans remain appropriate if dizziness can be addressed. PT will continue to follow acutely.    Follow Up Recommendations  Home health PT;Supervision/Assistance - 24 hour     Equipment Recommendations  None recommended by PT (has BSC and RW)    Recommendations for Other Services OT consult     Precautions / Restrictions Precautions Precautions: Fall Precaution Comments: dizziness possible vertigo Restrictions Weight Bearing Restrictions: No    Mobility  Bed Mobility Overal bed mobility: Needs Assistance Bed Mobility: Supine to Sit;Sit to Supine     Supine to sit: Mod assist;HOB elevated Sit to supine: Min assist   General bed mobility comments: modA for bringing trunk to upright due to inability to utilize UE to push up, minA for management of LE into bed  Transfers Overall transfer level: Needs assistance Equipment used: 1 person hand held assist Transfers: Sit  to/from Stand Sit to Stand: Min assist         General transfer comment: min A for power up and steadying heavy reliance on posterior lean against bed to steady herself  Ambulation/Gait Ambulation/Gait assistance: Mod assist Gait Distance (Feet): 2 Feet Assistive device: 1 person hand held assist Gait Pattern/deviations: Step-to pattern;Shuffle Gait velocity: slowed   General Gait Details: modA for steadying with 2 feet lateral stepping towards HoB, continued reliance on posterior support from bed        Balance Overall balance assessment: Needs assistance Sitting-balance support: No upper extremity supported;Feet supported (unable to use UE for support due to pain ) Sitting balance-Leahy Scale: Poor     Standing balance support: Bilateral upper extremity supported;During functional activity Standing balance-Leahy Scale: Poor                              Cognition Arousal/Alertness: Awake/alert Behavior During Therapy: Flat affect Overall Cognitive Status: Within Functional Limits for tasks assessed                                 General Comments: continues to be very flat, and minimally conversant         General Comments General comments (skin integrity, edema, etc.): BP in supine 128/63, with sitting 123/68 pt with c/o increased dizziness with positional change into sitting, nystagmus noted with vestibular screen, pt reports history of vertigo, requested order for vestibular exam      Pertinent Vitals/Pain Pain Assessment:  Faces Faces Pain Scale: Hurts even more Pain Location: R wrist, L elbow Pain Descriptors / Indicators: Aching;Dull;Sharp           PT Goals (current goals can now be found in the care plan section) Acute Rehab PT Goals Patient Stated Goal: have less pain PT Goal Formulation: With patient/family Time For Goal Achievement: 04/07/20 Potential to Achieve Goals: Fair Progress towards PT goals: Not progressing  toward goals - comment (limited by dizziness )    Frequency    Min 3X/week      PT Plan Current plan remains appropriate       AM-PAC PT "6 Clicks" Mobility   Outcome Measure  Help needed turning from your back to your side while in a flat bed without using bedrails?: A Little Help needed moving from lying on your back to sitting on the side of a flat bed without using bedrails?: A Lot Help needed moving to and from a bed to a chair (including a wheelchair)?: Total Help needed standing up from a chair using your arms (e.g., wheelchair or bedside chair)?: Total Help needed to walk in hospital room?: Total Help needed climbing 3-5 steps with a railing? : Total 6 Click Score: 9    End of Session Equipment Utilized During Treatment: Gait belt Activity Tolerance: Treatment limited secondary to medical complications (Comment) (dizziness and nausea) Patient left: in bed;with call bell/phone within reach;with family/visitor present Nurse Communication: Mobility status;Other (comment) (nystagmus with vestibular screen ) PT Visit Diagnosis: Other abnormalities of gait and mobility (R26.89);Muscle weakness (generalized) (M62.81);Difficulty in walking, not elsewhere classified (R26.2);Dizziness and giddiness (R42);Pain Pain - Right/Left:  (bilateral ) Pain - part of body:  (R wrist, L elbow)     Time: 0600-4599 PT Time Calculation (min) (ACUTE ONLY): 26 min  Charges:  $Therapeutic Activity: 23-37 mins                     Yamato Kopf B. Migdalia Dk PT, DPT Acute Rehabilitation Services Pager [phone] Office [phone]    [place] 03/26/2020, 11:29 AM

## 2020-03-26 NOTE — Progress Notes (Signed)
Pt.went to Afib.;HR=117;pt.was asymptomatic B/P=119/71;HR=116;then converted back to SR..Will cont.to monitor pt.

## 2020-03-26 NOTE — Progress Notes (Signed)
PROGRESS NOTE  Heidi Adams VZC:[id number] DOB: 1935/08/05 DOA: 03/21/2020 PCP: [name], MD   LOS: 3 days   Brief narrative: As per HPI,  Patient  is a 84 year old female with past medical history of hypothyroidism, hypertension, atrial fibrillation with RVR congestive heart failure presented to the hospital with shortness of breath for 2 to 3 days with palpitation, dizziness ,lightheadedness and sweating with chest discomfort.  In the ER, the patient was tachycardic, heart rate up to 150s.  She also complained of chest pain.  She was also noted to have a fever fever of 102F.  The ER physician discussed the situation with the cardiologist Dr. [name] and the decision was made to cardiovert the patient.  She tolerated the procedure well.  Post cardioversion patient was admitted to the hospital for further observation.  Patient was started on amiodarone, Entresto during hospitalization but subsequently had hypotension and A. fib with RVR despite being on amiodarone.  Received amiodarone IV push 03/25/2020 and beta-blockers, Entresto were discontinued for orthostatic hypotension and rapid ventricular response.  Cardiology closely following and planning to reintroduce beta-blockers and losartan as able.  Assessment/Plan:  Principal Problem:   Atrial fibrillation with RVR (HCC) Active Problems:   Hypothyroidism   Essential hypertension   Pharyngoesophageal dysphagia   Malignant neoplasm of upper-inner quadrant of left breast in female, estrogen receptor positive ([place])   Acute cystitis   History of congestive heart failure   E coli bacteremia   A-fib (HCC)  Paroxysmal atrial fibrillation with RVR She was cardioverted in the ER.Marland Kitchen Received IV fluid bolus for orthostatic hypotension and tachycardia on 03/25/2020.  Currently metoprolol Entresto on hold.  Patient is on amiodarone 200 mg twice a day at this time.  Further management as per cardiology.  Cardiology plans to introduce metoprolol and  losartan tomorrow.  Mild orthostatic hypotension yesterday.  Patient still feels dizzy and lightheaded.  Patient was hypotensive on 03/25/2020 with RVR.  Has improved today.  Will need to continue to ambulate the patient and check orthostatic vitals.   History of chronic systolic congestive heart failure.  2D echocardiogram on 03/11/2020 showed LV ejection fraction of 15 to 20% with moderate aortic and mitral regurgitation.  We will continue to hold  Entresto, metoprolol, farxiga. Patient follows up with Dr. [name] cardiology as outpatient.  Patient was on metoprolol and Lasix as outpatient. Patient will likely need home health on discharge including nursing for medication management. May consider Lasix as needed on discharge.  Chronic nocturnal use of oxygen 2L likely secondary to CHF.  Will continue while in the hospital.   Essential hypertension Holding metoprolol, Entresto    Hypothyroidism Continue Synthroid.  Acute E. coli cystitis with E. coli bacteremia. On IV cefazolin as per sensitivity.  Urine culture showing E. coli.  Blood cultures with E. coli bacteremia.  Plan to complete a 7-day course of antibiotic.  Hypokalemia improved with replacement.  Potassium 3.7 today.  Mild renal insufficiency .  Creatinine of 1.25 from 1.25.  Closely monitor BMP.  Debility, deconditioning.  Will ambulate the patient in hall as able.  Has orthostatic symptoms.  Will likely need home health on discharge.  DVT prophylaxis:  apixaban (ELIQUIS) tablet 2.5 mg    Code Status: Full code  Family Communication: I again spoke with the patient's husband at bedside  Status is: Inpatient  The patient will require  inpatient because:  Inpatient level of care appropriate due to severity of illness, status post cardioversion, A. fib with RVR  with orthostatic hypotension,, E. coli bacteremia,  Dispo: The patient is from: Home              Anticipated d/c is to: Home              Anticipated d/c date is: 1  to 2 days              Patient currently is not medically stable to d/c.  Patient is at a high risk of deterioration due to severe cardiomyopathy with atrial fibrillation with rapid ventricular response, hypotension and E. coli bacteremia.  Consultants:  Cardiology Dr. [name]  Procedures:  DC cardioversion  Antibiotics:  . Rocephin IV 8/28> 03/23/2020 . Cefazolin 03/24/2020>  Subjective:  Today, patient was seen and examined at bedside.  Patient is still feels dizzy lightheaded but denies any chest pain or shortness of breath.   Objective: Vitals:   03/26/20 0044 03/26/20 0538  BP: 119/71 (!) 151/68  Pulse:  75  Resp:  18  Temp:  98.8 F (37.1 C)  SpO2:  95%    Intake/Output Summary (Last 24 hours) at 03/26/2020 1032 Last data filed at 03/26/2020 0500 Gross per 24 hour  Intake 383.57 ml  Output 700 ml  Net -316.43 ml   Filed Weights   03/23/20 0341 03/24/20 0500 03/25/20 0500  Weight: 62.8 kg 60.3 kg 59.5 kg   Body mass index is 22.52 kg/m.   Physical Exam:  General: Alert awake and communicative, not in obvious distress, thinly built, frail appearing female  HENT:   No scleral pallor or icterus noted. Oral mucosa is moist.  Chest:  Clear breath sounds.  Diminished breath sounds bilaterally. No crackles or wheezes.  CVS: S1 &S2 heard. No murmur.  Irregularly irregular rhythm Abdomen: Soft, nontender, nondistended.  Bowel sounds are heard.   Extremities: No cyanosis, clubbing but trace edema.  Peripheral pulses are palpable. Psych: Alert, awake and oriented, normal mood CNS:  No cranial nerve deficits.  Power equal in all extremities.   Skin: Warm and dry.  No rashes noted.   Data Review: I have personally reviewed the following laboratory data and studies,  CBC: Recent Labs  Lab 03/21/20 2116 03/21/20 2116 03/21/20 2330 03/23/20 0239 03/24/20 0339 03/25/20 0452 03/26/20 0413  WBC 19.5*  --   --  17.3* 16.1* 14.5* 14.0*  HGB 9.1*   < > 8.8* 7.9* 8.8*  9.9* 10.0*  HCT 30.5*   < > 26.0* 26.7* 30.1* 34.1* 33.5*  MCV 79.0*  --   --  79.0* 78.0* 76.8* 77.5*  PLT 427*  --   --  375 458* 505* 546*   < > = values in this interval not displayed.   Basic Metabolic Panel: Recent Labs  Lab 03/21/20 2116 03/21/20 2116 03/21/20 2330 03/23/20 0239 03/24/20 0339 03/25/20 0452 03/26/20 0413  NA 134*   < > 135 137 138 137 139  K 3.8   < > 3.4* 3.8 3.9 3.7 3.7  CL 98   < > 97* 99 101 101 103  CO2 22  --   --  27 26 26 26   GLUCOSE 126*   < > 122* 104* 106* 112* 93  BUN 31*   < > 28* 29* 27* 30* 25*  CREATININE 1.10*   < > 1.10* 1.09* 1.00 1.25* 1.25*  CALCIUM 8.4*  --   --  8.3* 8.3* 8.2* 8.0*  MG 1.8  --   --  2.0 2.2 2.4 2.4  PHOS  --   --   --  2.8  --   --   --    < > = values in this interval not displayed.   Liver Function Tests: Recent Labs  Lab 03/20/20 1043 03/21/20 2116 03/23/20 0239 03/24/20 0339  AST 38 37 26 31  ALT 22 33 27 28  ALKPHOS 89 80 80 79  BILITOT 0.5 0.4 0.3 0.5  PROT 5.9* 5.8* 5.3* 5.1*  ALBUMIN 3.5* 2.5* 2.2* 2.1*   No results for input(s): LIPASE, AMYLASE in the last 168 hours. No results for input(s): AMMONIA in the last 168 hours. Cardiac Enzymes: No results for input(s): CKTOTAL, CKMB, CKMBINDEX, TROPONINI in the last 168 hours. BNP (last 3 results) Recent Labs    03/20/20 1043 03/21/20 2116  BNP 874.3* 1,091.1*    ProBNP (last 3 results) No results for input(s): PROBNP in the last 8760 hours.  CBG: Recent Labs  Lab 03/21/20 2120  GLUCAP 132*   Recent Results (from the past 240 hour(s))  SARS CORONAVIRUS 2 (TAT 6-24 HRS) Nasopharyngeal Nasopharyngeal Swab     Status: None   Collection Time: 03/20/20 11:33 AM   Specimen: Nasopharyngeal Swab  Result Value Ref Range Status   SARS Coronavirus 2 NEGATIVE NEGATIVE Final    Comment: (NOTE) SARS-CoV-2 target nucleic acids are NOT DETECTED.  The SARS-CoV-2 RNA is generally detectable in upper and lower respiratory specimens during the acute  phase of infection. Negative results do not preclude SARS-CoV-2 infection, do not rule out co-infections with other pathogens, and should not be used as the sole basis for treatment or other patient management decisions. Negative results must be combined with clinical observations, patient history, and epidemiological information. The expected result is Negative.  Fact Sheet for Patients: SugarRoll.be  Fact Sheet for Healthcare Providers: https://www.woods-mathews.com/  This test is not yet approved or cleared by the [place] FDA and  has been authorized for detection and/or diagnosis of SARS-CoV-2 by FDA under an Emergency Use Authorization (EUA). This EUA will remain  in effect (meaning this test can be used) for the duration of the COVID-19 declaration under Se ction 564(b)(1) of the Act, 21 U.S.C. section 360bbb-3(b)(1), unless the authorization is terminated or revoked sooner.  Performed at [facility], [place] [address] [place], [place] [address]   SARS Coronavirus 2 by RT PCR (hospital order, performed in [facility] hospital lab) Nasopharyngeal Nasopharyngeal Swab     Status: None   Collection Time: 03/21/20  9:43 PM   Specimen: Nasopharyngeal Swab  Result Value Ref Range Status   SARS Coronavirus 2 NEGATIVE NEGATIVE Final    Comment: (NOTE) SARS-CoV-2 target nucleic acids are NOT DETECTED.  The SARS-CoV-2 RNA is generally detectable in upper and lower respiratory specimens during the acute phase of infection. The lowest concentration of SARS-CoV-2 viral copies this assay can detect is 250 copies / mL. A negative result does not preclude SARS-CoV-2 infection and should not be used as the sole basis for treatment or other patient management decisions.  A negative result may occur with improper specimen collection / handling, submission of specimen other than nasopharyngeal swab, presence of viral mutation(s) within  the areas targeted by this assay, and inadequate number of viral copies (<250 copies / mL). A negative result must be combined with clinical observations, patient history, and epidemiological information.  Fact Sheet for Patients:   StrictlyIdeas.no  Fact Sheet for Healthcare Providers: BankingDealers.co.za  This test is not yet approved or  cleared by the [place] FDA and has been authorized  for detection and/or diagnosis of SARS-CoV-2 by FDA under an Emergency Use Authorization (EUA).  This EUA will remain in effect (meaning this test can be used) for the duration of the COVID-19 declaration under Section 564(b)(1) of the Act, 21 U.S.C. section 360bbb-3(b)(1), unless the authorization is terminated or revoked sooner.  Performed at [facility], [place] [address] [place], [place] [address]   Blood Culture (routine x 2)     Status: None   Collection Time: 03/21/20  9:48 PM   Specimen: BLOOD  Result Value Ref Range Status   Specimen Description BLOOD SITE NOT SPECIFIED  Final   Special Requests   Final    BOTTLES DRAWN AEROBIC AND ANAEROBIC Blood Culture results may not be optimal due to an inadequate volume of blood received in culture bottles   Culture   Final    NO GROWTH 5 DAYS Performed at [facility], [place] [address], [place], [place] [address]    Report Status 03/26/2020 FINAL  Final  Urine culture     Status: Abnormal   Collection Time: 03/21/20 10:43 PM   Specimen: In/Out Cath Urine  Result Value Ref Range Status   Specimen Description IN/OUT CATH URINE  Final   Special Requests   Final    NONE Performed at [facility], [place] [address] [place], [place] [address]    Culture >=100,000 COLONIES/mL ESCHERICHIA COLI (A)  Final   Report Status 03/24/2020 FINAL  Final   Organism ID, Bacteria ESCHERICHIA COLI (A)  Final      Susceptibility   Escherichia coli - MIC*    AMPICILLIN >=32 RESISTANT  Resistant     CEFAZOLIN <=4 SENSITIVE Sensitive     CEFTRIAXONE <=0.25 SENSITIVE Sensitive     CIPROFLOXACIN <=0.25 SENSITIVE Sensitive     GENTAMICIN <=1 SENSITIVE Sensitive     IMIPENEM <=0.25 SENSITIVE Sensitive     NITROFURANTOIN <=16 SENSITIVE Sensitive     TRIMETH/SULFA <=20 SENSITIVE Sensitive     AMPICILLIN/SULBACTAM >=32 RESISTANT Resistant     PIP/TAZO <=4 SENSITIVE Sensitive     * >=100,000 COLONIES/mL ESCHERICHIA COLI  Blood Culture (routine x 2)     Status: Abnormal   Collection Time: 03/21/20 11:49 PM   Specimen: BLOOD LEFT HAND  Result Value Ref Range Status   Specimen Description BLOOD LEFT HAND  Final   Special Requests   Final    BOTTLES DRAWN AEROBIC AND ANAEROBIC Blood Culture adequate volume   Culture  Setup Time   Final    GRAM NEGATIVE RODS IN BOTH AEROBIC AND ANAEROBIC BOTTLES Organism ID to follow CRITICAL RESULT CALLED TO, READ BACK BY AND VERIFIED WITHCatalina Lunger PHARMD, AT 1510 03/22/20 BY D. VANHOOK Performed at [facility], [place] [address] [place], [place] [address]    Culture ESCHERICHIA COLI (A)  Final   Report Status 03/24/2020 FINAL  Final   Organism ID, Bacteria ESCHERICHIA COLI  Final      Susceptibility   Escherichia coli - MIC*    AMPICILLIN >=32 RESISTANT Resistant     CEFAZOLIN <=4 SENSITIVE Sensitive     CEFEPIME <=0.12 SENSITIVE Sensitive     CEFTAZIDIME <=1 SENSITIVE Sensitive     CEFTRIAXONE <=0.25 SENSITIVE Sensitive     CIPROFLOXACIN <=0.25 SENSITIVE Sensitive     GENTAMICIN <=1 SENSITIVE Sensitive     IMIPENEM <=0.25 SENSITIVE Sensitive     TRIMETH/SULFA <=20 SENSITIVE Sensitive     AMPICILLIN/SULBACTAM >=32 RESISTANT Resistant  PIP/TAZO <=4 SENSITIVE Sensitive     * ESCHERICHIA COLI  Blood Culture ID Panel (Reflexed)     Status: Abnormal   Collection Time: 03/21/20 11:49 PM  Result Value Ref Range Status   Enterococcus faecalis NOT DETECTED NOT DETECTED Final   Enterococcus Faecium NOT DETECTED NOT DETECTED  Final   Listeria monocytogenes NOT DETECTED NOT DETECTED Final   Staphylococcus species NOT DETECTED NOT DETECTED Final   Staphylococcus aureus (BCID) NOT DETECTED NOT DETECTED Final   Staphylococcus epidermidis NOT DETECTED NOT DETECTED Final   Staphylococcus lugdunensis NOT DETECTED NOT DETECTED Final   Streptococcus species NOT DETECTED NOT DETECTED Final   Streptococcus agalactiae NOT DETECTED NOT DETECTED Final   Streptococcus pneumoniae NOT DETECTED NOT DETECTED Final   Streptococcus pyogenes NOT DETECTED NOT DETECTED Final   A.calcoaceticus-baumannii NOT DETECTED NOT DETECTED Final   Bacteroides fragilis NOT DETECTED NOT DETECTED Final   Enterobacterales DETECTED (A) NOT DETECTED Final    Comment: Enterobacterales represent a large order of gram negative bacteria, not a single organism. CRITICAL RESULT CALLED TO, READ BACK BY AND VERIFIED WITH: H. VON DOHLEN PHARMD, AT 1510 03/22/20 BY D. VANHOOK    Enterobacter cloacae complex NOT DETECTED NOT DETECTED Final   Escherichia coli DETECTED (A) NOT DETECTED Final    Comment: CRITICAL RESULT CALLED TO, READ BACK BY AND VERIFIED WITH: H. VON DOHLEN PHARMD, AT 1510 03/22/20 BY D. VANHOOK    Klebsiella aerogenes NOT DETECTED NOT DETECTED Final   Klebsiella oxytoca NOT DETECTED NOT DETECTED Final   Klebsiella pneumoniae NOT DETECTED NOT DETECTED Final   Proteus species NOT DETECTED NOT DETECTED Final   Salmonella species NOT DETECTED NOT DETECTED Final   Serratia marcescens NOT DETECTED NOT DETECTED Final   Haemophilus influenzae NOT DETECTED NOT DETECTED Final   Neisseria meningitidis NOT DETECTED NOT DETECTED Final   Pseudomonas aeruginosa NOT DETECTED NOT DETECTED Final   Stenotrophomonas maltophilia NOT DETECTED NOT DETECTED Final   Candida albicans NOT DETECTED NOT DETECTED Final   Candida auris NOT DETECTED NOT DETECTED Final   Candida glabrata NOT DETECTED NOT DETECTED Final   Candida krusei NOT DETECTED NOT DETECTED Final    Candida parapsilosis NOT DETECTED NOT DETECTED Final   Candida tropicalis NOT DETECTED NOT DETECTED Final   Cryptococcus neoformans/gattii NOT DETECTED NOT DETECTED Final   CTX-M ESBL NOT DETECTED NOT DETECTED Final   Carbapenem resistance IMP NOT DETECTED NOT DETECTED Final   Carbapenem resistance KPC NOT DETECTED NOT DETECTED Final   Carbapenem resistance NDM NOT DETECTED NOT DETECTED Final   Carbapenem resist OXA 48 LIKE NOT DETECTED NOT DETECTED Final   Carbapenem resistance VIM NOT DETECTED NOT DETECTED Final    Comment: Performed at [facility], [address], [place], [place] [address]     Studies: No results found.    Flora Lipps, MD  [facility] 03/26/2020

## 2020-03-26 NOTE — Progress Notes (Signed)
Progress Note  Patient Name: Heidi Adams Date of Encounter: 03/26/2020  Attending physician: Flora Lipps, MD Primary care provider: [name], MD Primary Cardiologist: Dr. [name] Consultant:Bryona Foxworthy Terri Skains, DO  Subjective: Heidi Adams is a 84 y.o. female who was seen and examined at bedside at approximately 8:45am No events overnight. Patient denies any chest pain at rest or with effort related activities, no shortness of breath at rest or with effort related activities, no orthopnea, paroxysmal nocturnal dyspnea or lower extremity swelling. Still continues to have lightheaded and dizziness with changing positions. Patient had a brief episode of A. fib with RVR yesterday night it spontaneously converted to normal sinus. Case discussed and reviewed with her nurse.  Objective: Vital Signs in the last 24 hours: Temp:  [97.2 F (36.2 C)-98.8 F (37.1 C)] 98.8 F (37.1 C) (09/02 0538) Pulse Rate:  [70-75] 75 (09/02 0538) Resp:  [16-18] 18 (09/02 0538) BP: (86-151)/(55-83) 151/68 (09/02 0538) SpO2:  [95 %-96 %] 95 % (09/02 0538)  Intake/Output:  Intake/Output Summary (Last 24 hours) at 03/26/2020 0958 Last data filed at 03/26/2020 0500 Gross per 24 hour  Intake 383.57 ml  Output 700 ml  Net -316.43 ml    Net IO Since Admission: -1,536.5 mL [03/26/20 0958]  Weights:  Filed Weights   03/23/20 0341 03/24/20 0500 03/25/20 0500  Weight: 62.8 kg 60.3 kg 59.5 kg    Telemetry: Personally reviewed.  Normal sinus rhythm.  Physical examination: PHYSICAL EXAM: Vitals with BMI 03/26/2020 03/26/2020 03/25/2020  Height - - -  Weight - - -  BMI - - -  Systolic 387 564 332  Diastolic 68 71 83  Pulse 75 - 70   CONSTITUTIONAL: Appears older than stated age, hemodynamically stable, no acute distress.   SKIN: Skin is warm and dry. No rash noted. No cyanosis. No pallor. No jaundice HEAD: Normocephalic and atraumatic.  EYES: No scleral icterus MOUTH/THROAT: Dry oral membranes.  NECK: No  JVD present. No thyromegaly noted. No carotid bruits  LYMPHATIC: No visible cervical adenopathy.  CHEST Normal respiratory effort. No intercostal retractions  LUNGS: Clear to auscultation bilaterally. No stridor. No wheezes. No rales.  CARDIOVASCULAR: Regular rate and rhythm, positive R5-J8, soft holosystolic murmur at the apex, no gallops or rubs. ABDOMINAL: soft, nontender, nondistended, positive bowel sounds all 4 quadrants. No apparent ascites.  EXTREMITIES: No peripheral edema  HEMATOLOGIC: No significant bruising NEUROLOGIC: Oriented to person, place, and time. Nonfocal. Normal muscle tone.  PSYCHIATRIC: Normal mood and affect. Normal behavior. Cooperative  Lab Results: Hematology is Recent Labs  Lab 03/24/20 0339 03/25/20 0452 03/26/20 0413  WBC 16.1* 14.5* 14.0*  RBC 3.86* 4.44 4.32  HGB 8.8* 9.9* 10.0*  HCT 30.1* 34.1* 33.5*  MCV 78.0* 76.8* 77.5*  MCH 22.8* 22.3* 23.1*  MCHC 29.2* 29.0* 29.9*  RDW 17.2* 17.2* 17.1*  PLT 458* 505* 546*    Chemistry Recent Labs  Lab 03/21/20 2116 03/21/20 2330 03/23/20 0239 03/23/20 0239 03/24/20 0339 03/25/20 0452 03/26/20 0413  NA 134*   < > 137   < > 138 137 139  K 3.8   < > 3.8   < > 3.9 3.7 3.7  CL 98   < > 99   < > 101 101 103  CO2 22   < > 27   < > 26 26 26   GLUCOSE 126*   < > 104*   < > 106* 112* 93  BUN 31*   < > 29*   < > 27*  30* 25*  CREATININE 1.10*   < > 1.09*   < > 1.00 1.25* 1.25*  CALCIUM 8.4*   < > 8.3*   < > 8.3* 8.2* 8.0*  PROT 5.8*  --  5.3*  --  5.1*  --   --   ALBUMIN 2.5*  --  2.2*  --  2.1*  --   --   AST 37  --  26  --  31  --   --   ALT 33  --  27  --  28  --   --   ALKPHOS 80  --  80  --  79  --   --   BILITOT 0.4  --  0.3  --  0.5  --   --   GFRNONAA 47*   < > 48*   < > 53* 40* 40*  GFRAA 55*   < > 55*   < > >60 47* 47*  ANIONGAP 14   < > 11   < > 11 10 10    < > = values in this interval not displayed.     Cardiac Enzymes: Cardiac Panel (last 3 results) No results for input(s): CKTOTAL,  CKMB, TROPONINIHS, RELINDX in the last 72 hours.  BNP (last 3 results) Recent Labs    03/20/20 1043 03/21/20 2116  BNP 874.3* 1,091.1*    ProBNP (last 3 results) No results for input(s): PROBNP in the last 8760 hours.   DDimer No results for input(s): DDIMER in the last 168 hours.   Hemoglobin A1c:  Lab Results  Component Value Date   HGBA1C 5.0 07/31/2018   MPG 96.8 07/31/2018    TSH  Recent Labs    03/21/20 2329  TSH 5.095*    Lipid Panel     Component Value Date/Time   CHOL 142 07/31/2018 0655   TRIG 72 07/31/2018 0655   HDL 45 07/31/2018 0655   CHOLHDL 3.2 07/31/2018 0655   VLDL 14 07/31/2018 0655   LDLCALC 83 07/31/2018 0655    Imaging: No results found.  Cardiac database: EKG: 03/21/2020:  Atrial fibrillation with rapid ventricular response at rate of 142 bpm, left axis deviation, left anterior fascicular block. Poor R wave progression, cannot exclude anteroseptal infarct old. Nonspecific T abnormality.  Echocardiogram: 03/10/2020:  Left ventricle cavity is normal in size. Moderate concentric hypertrophy of the left ventricle. Severe global hypokinesis. LVEF 15-20%. Doppler evidence of grade II (pseudonormal) diastolic dysfunction, elevated LAP.  The aortic root is mildly dilated 3.9 cm.  Left atrial cavity is severely dilated at 65 cc/m2.  Trileaflet aortic valve. Moderate (Grade II) aortic regurgitation.  Moderate (Grade III) mitral regurgitation.  Mild tricuspid regurgitation. Estimated pulmonary artery systolic pressure 38 mmHg.  Mild pulmonic regurgitation.  Previous study on 07/30/2018, EF 40% (reported normal)   Stress test: Lexiscan Myoview stress test 04/23/2018: 1. The resting electrocardiogram demonstrated normal sinus rhythm, normal resting conduction and no resting arrhythmias. Stress EKG is non-diagnostic for ischemia as it a pharmacologic stress using Lexiscan. Occasional PAC and PVC. The patient developed significant symptoms which  included Chest Pain, Stomach pain, Headache.  2. The overall quality of the study is good. There is no evidence of abnormal lung activity. Stress and rest SPECT images demonstrate homogeneous tracer distribution throughout the myocardium. Gated SPECT imaging reveals diffuse global hypokinesis. The left ventricular ejection fraction was markedly depressed at (26%).  3. This is a high risk study due to severe LV systolic dysfunction. Findings may represent non  ischemic cardiomyopathy.  Scheduled Meds: . amiodarone  200 mg Oral BID  . apixaban  2.5 mg Oral BID  . levothyroxine  112 mcg Oral Once per day on Mon Tue Wed Thu Fri Sat   And  . levothyroxine  168 mcg Oral Once per day on Sun  . Living Better with Heart Failure Book   Does not apply Once  . magnesium oxide  400 mg Oral BID  . off the beat book   Does not apply Once    Continuous Infusions: .  ceFAZolin (ANCEF) IV 2 g (03/26/20 0758)    PRN Meds: acetaminophen, lidocaine, oxyCODONE, pantoprazole   IMPRESSION & RECOMMENDATIONS: Heidi Adams is a 84 y.o. female whose past medical history and cardiac risk factors include: paroxysmal Afib, HFrEF, admitted with UTI and acute on chronic HFrEF.  Acute Heart failure with reduced EF, stage C, class II/III:  Patient is overall euvolemic.   Initially started on Entresto  but discontinued due to symptoms of orthostasis and low BP.   Continue beta block therapy.   Patient continues to feel lightheaded and dizzy therefore, will hold off the initiation of Entrestro for another 24hr. If unable to start Entresto due to lightheaded and dizzy will consider low dose losartan as an alternative. Her symptoms should improve as her UTI and bacteremia improves.   May even consider low-dose IV fluids for gentle hydration purposes given her underlying bacteremia.   Atrial fibrillation, paroxysmal: . Rate control: Beta-blocker . Rhythm control: Amiodarone . Thromboembolic prophylaxis:  Eliquis . CHA2DS2-VASc SCORE is 5 which correlates to 7.2 % risk of stroke per year. . Patient does not endorse any evidence of bleeding. . Will change to Amiodarone daily tomorrow if she remains NSR.   Long-term oral anticoagulation: . Indication: Paroxysmal atrial fibrillation . Currently on Eliquis . Estimated Creatinine Clearance: 30.5 mL/min (A) (by C-G formula based on SCr of 1.25 mg/dL (H)). . Serum creatinine: 1.25 mg/dL (H) 03/26/20 0413 . Estimated creatinine clearance: 30.5 mL/min (A) . Patient does not endorse any obvious bleeding. . Also reviewed with the patient the risks, benefits, and alternatives of oral anticoagulation.  Acute cystitis and E. coli bacteremia: Managed by primary team.  Patient's and her husband's questions and concerns were addressed to there satisfaction. She and her husband voices understanding of the instructions provided during this encounter.   This note was created using a voice recognition software as a result there may be grammatical errors inadvertently enclosed that do not reflect the nature of this encounter. Every attempt is made to correct such errors.  Rex Kras, DO, [facility]. [place] Office: [phone] 03/26/2020, 9:58 AM

## 2020-03-27 ENCOUNTER — Other Ambulatory Visit: Payer: Self-pay

## 2020-03-27 LAB — CBC
HCT: 31.4 % — ABNORMAL LOW (ref 36.0–46.0)
Hemoglobin: 9 g/dL — ABNORMAL LOW (ref 12.0–15.0)
MCH: 22.3 pg — ABNORMAL LOW (ref 26.0–34.0)
MCHC: 28.7 g/dL — ABNORMAL LOW (ref 30.0–36.0)
MCV: 77.9 fL — ABNORMAL LOW (ref 80.0–100.0)
Platelets: 449 10*3/uL — ABNORMAL HIGH (ref 150–400)
RBC: 4.03 MIL/uL (ref 3.87–5.11)
RDW: 17.4 % — ABNORMAL HIGH (ref 11.5–15.5)
WBC: 14.3 10*3/uL — ABNORMAL HIGH (ref 4.0–10.5)
nRBC: 0 % (ref 0.0–0.2)

## 2020-03-27 LAB — BASIC METABOLIC PANEL
Anion gap: 9 (ref 5–15)
BUN: 17 mg/dL (ref 8–23)
CO2: 26 mmol/L (ref 22–32)
Calcium: 7.9 mg/dL — ABNORMAL LOW (ref 8.9–10.3)
Chloride: 104 mmol/L (ref 98–111)
Creatinine, Ser: 0.99 mg/dL (ref 0.44–1.00)
GFR calc Af Amer: >60 mL/min (ref >60)
GFR calc non Af Amer: 53 mL/min — ABNORMAL LOW (ref >60)
Glucose, Bld: 100 mg/dL — ABNORMAL HIGH (ref 70–99)
Potassium: 3.7 mmol/L (ref 3.5–5.1)
Sodium: 139 mmol/L (ref 135–145)

## 2020-03-27 LAB — MAGNESIUM: Magnesium: 2.4 mg/dL (ref 1.7–2.4)

## 2020-03-27 MED ORDER — CEPHALEXIN 500 MG PO CAPS: 500.0000 mg | ORAL_CAPSULE | Freq: Three times a day (TID) | ORAL | 0 refills | Status: AC

## 2020-03-27 MED ORDER — APIXABAN 5 MG PO TABS: 5.0000 mg | ORAL_TABLET | Freq: Two times a day (BID) | ORAL | Status: DC

## 2020-03-27 MED ORDER — MECLIZINE HCL 25 MG PO TABS: 25.0000 mg | ORAL_TABLET | Freq: Three times a day (TID) | ORAL | Status: DC | PRN

## 2020-03-27 MED ORDER — LOSARTAN POTASSIUM 25 MG PO TABS
25.0000 mg | ORAL_TABLET | Freq: Every day | ORAL | 2 refills | Status: DC
Start: 2020-03-27 — End: 2020-04-13

## 2020-03-27 MED ORDER — LOSARTAN POTASSIUM 25 MG PO TABS: 25.0000 mg | ORAL_TABLET | Freq: Every day | ORAL | Status: DC

## 2020-03-27 MED ORDER — AMIODARONE HCL 200 MG PO TABS: 200.0000 mg | ORAL_TABLET | Freq: Every day | ORAL | Status: DC

## 2020-03-27 MED ORDER — FUROSEMIDE 40 MG PO TABS: 40.0000 mg | ORAL_TABLET | Freq: Every day | ORAL | Status: DC

## 2020-03-27 MED ORDER — METOPROLOL SUCCINATE ER 25 MG PO TB24
25.0000 mg | ORAL_TABLET | Freq: Every day | ORAL | Status: DC
  Administered 2020-03-27: 25 mg via ORAL
  Filled 2020-03-27: qty 1

## 2020-03-27 MED ORDER — MECLIZINE HCL 25 MG PO TABS: 25.0000 mg | ORAL_TABLET | Freq: Three times a day (TID) | ORAL | 0 refills | Status: DC | PRN

## 2020-03-27 MED ORDER — METOPROLOL SUCCINATE ER 25 MG PO TB24
25.0000 mg | ORAL_TABLET | Freq: Every day | ORAL | 2 refills | Status: DC
Start: 2020-03-27 — End: 2020-04-13

## 2020-03-27 MED ORDER — AMIODARONE HCL 200 MG PO TABS
200.0000 mg | ORAL_TABLET | Freq: Every day | ORAL | 2 refills | Status: DC
Start: 2020-03-28 — End: 2020-04-03

## 2020-03-27 MED FILL — CEPHALEXIN 500 MG CAPS: 500 | 3 days supply | Qty: 9 | Fill #0

## 2020-03-27 MED FILL — METOPROLOL SUCCINATE ER 25: 25 | 30 days supply | Qty: 30 | Fill #0

## 2020-03-27 MED FILL — AMIODARONE HCL 200 MG TAB: 200 | 30 days supply | Qty: 30 | Fill #0

## 2020-03-27 MED FILL — MECLIZINE 25 MG TABLET: 25 | 10 days supply | Qty: 30 | Fill #0

## 2020-03-27 MED FILL — LOSARTAN POTASSIUM 25 MG TA: 25 | 30 days supply | Qty: 30 | Fill #0

## 2020-03-27 NOTE — Progress Notes (Signed)
Physical Therapy Treatment Patient Details Name: Heidi Adams MRN: [id number] DOB: 1935-11-08 Today's Date: 03/27/2020    History of Present Illness 84 year old female who presents with complaint of shortness of breath, in addition to heart palpitations, left sided intermittent chest pain and lightheadedness and dizziness for the past 2 to 3 days. In ED pt tachycardic HR in 150s, with chest pain, and fever of 102. Pt cardioverted and HR in 80s. Admitted 03/21/20 for treatment of UTI.    PT Comments    Patient reports improvement in dizziness this morning when standing, did walk into hallway with me with RW and minguard A.  She does demonstrate L beating nystagmus with L gaze and positive R head thrust test consistent with R vestibular hypofunction.  She is improved with symptoms however, so feel appropriate to treat in [facility] setting.  Spouse and pt educated on fall risk and need for assist when ambulating with RW.  They have a w/c if needed and discussed mat for the shower and assist (also recommending [facility]ide).  Patient hopeful for home today.  Will follow up if not d/c.    Follow Up Recommendations  Home health PT;Supervision/Assistance - 24 hour [facility] aide)     Equipment Recommendations  None recommended by PT    Recommendations for Other Services       Precautions / Restrictions Precautions Precautions: Fall    Mobility  Bed Mobility Overal bed mobility: Needs Assistance Bed Mobility: Supine to Sit;Sit to Supine     Supine to sit: Min assist Sit to supine: Mod assist   General bed mobility comments: assist to lift trunk pt pulling up, then to supine assist for legs into bed  Transfers Overall transfer level: Needs assistance Equipment used: Rolling walker (2 wheeled) Transfers: Sit to/from Stand Sit to Stand: Min assist         General transfer comment: assist for balance, safety, pt pulling up on RW  Ambulation/Gait Ambulation/Gait assistance: Min guard Gait Distance  (Feet): 50 Feet Assistive device: Rolling walker (2 wheeled) Gait Pattern/deviations: Step-through pattern;Decreased stride length;Antalgic     General Gait Details: assist for balance, pt reports L hip pain with ambulation with slight limp   Stairs             Wheelchair Mobility    Modified Rankin (Stroke Patients Only)       Balance Overall balance assessment: Needs assistance Sitting-balance support: Feet supported Sitting balance-Leahy Scale: Fair     Standing balance support: Bilateral upper extremity supported Standing balance-Leahy Scale: Poor Standing balance comment: UE support for balance                            Cognition Arousal/Alertness: Awake/alert Behavior During Therapy: Flat affect;Anxious Overall Cognitive Status: Within Functional Limits for tasks assessed                                        Exercises Other Exercises Other Exercises: Educated pt in safety at home needing rubber mat on built in seat in shower and assistance for safety, discussed HHaide and HHPT, spouse also aware she needs assistance when up ambulating due to fall risk.    General Comments General comments (skin integrity, edema, etc.): seated VOR with positive refixation with R head thrust, noted L gaze holding nystagmus and slight difficulty with saccades, VOR cancellation WNL,  but reported up earlier today and no dizziness, did not complain with mobility this session either      Pertinent Vitals/Pain Pain Assessment: Faces Faces Pain Scale: Hurts even more Pain Location: L hip with ambulation Pain Descriptors / Indicators: Grimacing;Guarding Pain Intervention(s): Monitored during session    Home Living                      Prior Function            PT Goals (current goals can now be found in the care plan section) Progress towards PT goals: Progressing toward goals    Frequency    Min 3X/week      PT Plan Current  plan remains appropriate    Co-evaluation              AM-PAC PT "6 Clicks" Mobility   Outcome Measure  Help needed turning from your back to your side while in a flat bed without using bedrails?: A Little Help needed moving from lying on your back to sitting on the side of a flat bed without using bedrails?: A Little Help needed moving to and from a bed to a chair (including a wheelchair)?: A Little Help needed standing up from a chair using your arms (e.g., wheelchair or bedside chair)?: A Little Help needed to walk in hospital room?: A Little Help needed climbing 3-5 steps with a railing? : Total 6 Click Score: 16    End of Session   Activity Tolerance: Patient tolerated treatment well Patient left: in bed;with call bell/phone within reach;with family/visitor present   PT Visit Diagnosis: Other abnormalities of gait and mobility (R26.89);Muscle weakness (generalized) (M62.81);Dizziness and giddiness (R42);Pain Pain - Right/Left: Left Pain - part of body: Hip     Time: 5537-4827 PT Time Calculation (min) (ACUTE ONLY): 25 min  Charges:  $Gait Training: 8-22 mins $Neuromuscular Re-education: 8-22 mins                     Magda Kiel, PT Acute Rehabilitation Services [phone] Office:[phone] 03/27/2020    Reginia Naas 03/27/2020, 9:41 AM

## 2020-03-27 NOTE — Progress Notes (Signed)
Progress Note  Patient Name: Heidi Adams Date of Encounter: 03/27/2020  Attending physician: Flora Lipps, MD Primary care provider: [name], MD Primary Cardiologist: Dr. [name] Consultant:Shaneice Barsanti Terri Skains, DO  Subjective: Heidi Adams is a 84 y.o. female who was seen and examined at bedside at approximately 340pm No events overnight. Patient denies any chest pain at rest or with effort related activities, no shortness of breath at rest or with effort related activities, no orthopnea, paroxysmal nocturnal dyspnea or lower extremity swelling. No lightheaded and dizziness since yesterday.  Patient wanting to go home.  No episode of A. fib with RVR since yesterday. Case discussed and reviewed with her nurse.  Objective: Vital Signs in the last 24 hours: Temp:  [98.3 F (36.8 C)-98.6 F (37 C)] 98.6 F (37 C) (09/03 0855) Pulse Rate:  [71-82] 82 (09/03 0855) Resp:  [17-20] 20 (09/03 1116) BP: (132-153)/(66-76) 153/76 (09/03 1116) Weight:  [61.3 kg] 61.3 kg (09/03 0607)  Intake/Output:  Intake/Output Summary (Last 24 hours) at 03/27/2020 1550 Last data filed at 03/27/2020 1500 Gross per 24 hour  Intake 1260 ml  Output 1450 ml  Net -190 ml    Net IO Since Admission: -1,666.5 mL [03/27/20 1550]  Weights:  Filed Weights   03/24/20 0500 03/25/20 0500 03/27/20 0607  Weight: 60.3 kg 59.5 kg 61.3 kg    Telemetry: Personally reviewed.  Normal sinus rhythm.  Physical examination: PHYSICAL EXAM: Vitals with BMI 03/27/2020 03/27/2020 03/27/2020  Height - - -  Weight - - 135 lbs 2 oz  BMI - - 69.48  Systolic 546 270 -  Diastolic 76 75 -  Pulse - 82 -   CONSTITUTIONAL: Appears older than stated age, hemodynamically stable, no acute distress.   SKIN: Skin is warm and dry. No rash noted. No cyanosis. No pallor. No jaundice HEAD: Normocephalic and atraumatic.  EYES: No scleral icterus MOUTH/THROAT: Dry oral membranes.  NECK: No JVD present. No thyromegaly noted. No carotid bruits   LYMPHATIC: No visible cervical adenopathy.  CHEST Normal respiratory effort. No intercostal retractions  LUNGS: Clear to auscultation bilaterally. No stridor. No wheezes. No rales.  CARDIOVASCULAR: Regular rate and rhythm, positive J5-K0, soft holosystolic murmur at the apex, no gallops or rubs. ABDOMINAL: soft, nontender, nondistended, positive bowel sounds all 4 quadrants. No apparent ascites.  EXTREMITIES: No peripheral edema  HEMATOLOGIC: No significant bruising NEUROLOGIC: Oriented to person, place, and time. Nonfocal. Normal muscle tone.  PSYCHIATRIC: Normal mood and affect. Normal behavior. Cooperative  Lab Results: Hematology is Recent Labs  Lab 03/25/20 0452 03/26/20 0413 03/27/20 1458  WBC 14.5* 14.0* 14.3*  RBC 4.44 4.32 4.03  HGB 9.9* 10.0* 9.0*  HCT 34.1* 33.5* 31.4*  MCV 76.8* 77.5* 77.9*  MCH 22.3* 23.1* 22.3*  MCHC 29.0* 29.9* 28.7*  RDW 17.2* 17.1* 17.4*  PLT 505* 546* 449*    Chemistry Recent Labs  Lab 03/21/20 2116 03/21/20 2330 03/23/20 0239 03/23/20 0239 03/24/20 0339 03/24/20 0339 03/25/20 0452 03/26/20 0413 03/27/20 1458  NA 134*   < > 137   < > 138   < > 137 139 139  K 3.8   < > 3.8   < > 3.9   < > 3.7 3.7 3.7  CL 98   < > 99   < > 101   < > 101 103 104  CO2 22   < > 27   < > 26   < > 26 26 26   GLUCOSE 126*   < > 104*   < >  106*   < > 112* 93 100*  BUN 31*   < > 29*   < > 27*   < > 30* 25* 17  CREATININE 1.10*   < > 1.09*   < > 1.00   < > 1.25* 1.25* 0.99  CALCIUM 8.4*   < > 8.3*   < > 8.3*   < > 8.2* 8.0* 7.9*  PROT 5.8*  --  5.3*  --  5.1*  --   --   --   --   ALBUMIN 2.5*  --  2.2*  --  2.1*  --   --   --   --   AST 37  --  26  --  31  --   --   --   --   ALT 33  --  27  --  28  --   --   --   --   ALKPHOS 80  --  80  --  79  --   --   --   --   BILITOT 0.4  --  0.3  --  0.5  --   --   --   --   GFRNONAA 47*   < > 48*   < > 53*   < > 40* 40* 53*  GFRAA 55*   < > 55*   < > >60   < > 47* 47* >60  ANIONGAP 14   < > 11   < > 11   < > 10  10 9    < > = values in this interval not displayed.     Cardiac Enzymes: Cardiac Panel (last 3 results) No results for input(s): CKTOTAL, CKMB, TROPONINIHS, RELINDX in the last 72 hours.  BNP (last 3 results) Recent Labs    03/20/20 1043 03/21/20 2116  BNP 874.3* 1,091.1*    ProBNP (last 3 results) No results for input(s): PROBNP in the last 8760 hours.   DDimer No results for input(s): DDIMER in the last 168 hours.   Hemoglobin A1c:  Lab Results  Component Value Date   HGBA1C 5.0 07/31/2018   MPG 96.8 07/31/2018    TSH  Recent Labs    03/21/20 2329  TSH 5.095*    Lipid Panel     Component Value Date/Time   CHOL 142 07/31/2018 0655   TRIG 72 07/31/2018 0655   HDL 45 07/31/2018 0655   CHOLHDL 3.2 07/31/2018 0655   VLDL 14 07/31/2018 0655   LDLCALC 83 07/31/2018 0655    Imaging: No results found.  Cardiac database: EKG: 03/21/2020:  Atrial fibrillation with rapid ventricular response at rate of 142 bpm, left axis deviation, left anterior fascicular block. Poor R wave progression, cannot exclude anteroseptal infarct old. Nonspecific T abnormality.  Echocardiogram: 07/30/2018: LVEF 50%  03/10/2020:  Left ventricle cavity is normal in size. Moderate concentric hypertrophy of the left ventricle. Severe global hypokinesis. LVEF 15-20%. Doppler evidence of grade II (pseudonormal) diastolic dysfunction, elevated LAP.  The aortic root is mildly dilated 3.9 cm.  Left atrial cavity is severely dilated at 65 cc/m2.  Trileaflet aortic valve. Moderate (Grade II) aortic regurgitation.  Moderate (Grade III) mitral regurgitation.  Mild tricuspid regurgitation. Estimated pulmonary artery systolic pressure 38 mmHg.  Mild pulmonic regurgitation.  Previous study on 07/30/2018, EF 40% (reported normal)   Stress test: Lexiscan Myoview stress test 04/23/2018: 1. The resting electrocardiogram demonstrated normal sinus rhythm, normal resting conduction and no resting  arrhythmias. Stress EKG is  non-diagnostic for ischemia as it a pharmacologic stress using Lexiscan. Occasional PAC and PVC. The patient developed significant symptoms which included Chest Pain, Stomach pain, Headache.  2. The overall quality of the study is good. There is no evidence of abnormal lung activity. Stress and rest SPECT images demonstrate homogeneous tracer distribution throughout the myocardium. Gated SPECT imaging reveals diffuse global hypokinesis. The left ventricular ejection fraction was markedly depressed at (26%).  3. This is a high risk study due to severe LV systolic dysfunction. Findings may represent non ischemic cardiomyopathy.  Scheduled Meds: . [START ON 03/28/2020] amiodarone  200 mg Oral Daily  . apixaban  5 mg Oral BID  . levothyroxine  112 mcg Oral Once per day on Mon Tue Wed Thu Fri Sat   And  . levothyroxine  168 mcg Oral Once per day on Sun  . Living Better with Heart Failure Book   Does not apply Once  . losartan  25 mg Oral QHS  . magnesium oxide  400 mg Oral BID  . metoprolol succinate  25 mg Oral Daily  . off the beat book   Does not apply Once    Continuous Infusions: .  ceFAZolin (ANCEF) IV Stopped (03/27/20 1356)    PRN Meds: acetaminophen, lidocaine, meclizine, oxyCODONE, pantoprazole   IMPRESSION & RECOMMENDATIONS: GOLDEN EMILE is a 84 y.o. female whose past medical history and cardiac risk factors include: paroxysmal Afib, HFrEF, admitted with UTI and acute on chronic HFrEF.  Acute Heart failure with reduced EF, stage C, class II:  Patient is overall euvolemic.   Start Losartan 25mg  po qPM.   Start Toprol XL 25mg  po qAM.   Restart Lasix 40mg  po qAM.   Attempted to start Entresto during this hospitalization but she was experiencing lightheaded and dizziness. Other confounding factor could be the underlying Afib w/ RVR that she had during admission and acute cystitis / bacteremia.   Will retry transition to [facility] as  outpatinet.  Will uptitrate her medical rx to GDMT as hemodynamics and labs allow.   She will need follow up echo in 90 days to see if she should be considered for an AICD for primary prevention. Will defer to her primary cardiologist for now.   Cardiomyopathy: Suggestive of Non-ischemic CMP per last stress test. Recommended discussing possible LHC as outpatient to rule out CAD given the change in LVEF. In 2020 her LVEF was 50% and now 15-20%. Follow up with primary cardiology.  Atrial fibrillation, paroxysmal: . Rate control: Beta-blocker . Rhythm control: Amiodarone . Thromboembolic prophylaxis: Eliquis . CHA2DS2-VASc SCORE is 5 which correlates to 7.2 % risk of stroke per year. . She underwent cardioversion during the admission, patient aware of the importance of Past[place] unless she has bleeding complications.  . Will change to Amiodarone to 200mg  po q daily .   Long-term oral anticoagulation: . Indication: Paroxysmal atrial fibrillation . Currently on Eliquis . Patient does not endorse any obvious bleeding. . Also reviewed with the patient the risks, benefits, and alternatives of oral anticoagulation.  Acute cystitis and E. coli bacteremia: Managed by primary team.  Follow up visit with Dr. [name] on 04/03/2020 at 215pm.   Patient's and her husband's questions and concerns were addressed to there satisfaction. She and her husband voices understanding of the instructions provided during this encounter.   This note was created using a voice recognition software as a result there may be grammatical errors inadvertently enclosed that do not reflect the nature of this encounter.  Every attempt is made to correct such errors.  Rex Kras, DO, [facility]. [place] Office: [phone] 03/27/2020, 3:50 PM

## 2020-03-27 NOTE — Progress Notes (Signed)
Heart Failure Stewardship Pharmacist Progress Note   PCP: [name], MD PCP-Cardiologist: No primary care provider on file.    HPI:  84 yo F with PMH of hypothyroidism, HTN, afib with RVR, and CHF presenting to [facility] ED on 03/21/20 with afib RVR. She was cardioverted in the ED but returned to afib. Last ECHO done on 03/11/20 and showed LVEF of 15-20% with moderate aortic and mitral regurgitation. On 03/25/20, patient returned back to afib RVR and was hypotensive. Repeat bolus of IV amio given. HF medications were held. She has now converted back to NSR. BP improved and HR stabilized.  Current HF Medications: Metoprolol succinate 25 mg daily Losartan 25 mg daily  Prior to admission HF Medications: Furosemide 40 mg BID Metoprolol 25 mg BID  Pertinent Lab Values: . 9/3 labs pending . As of 9/2: Serum creatinine 1.25, BUN 25, Potassium 3.7, Sodium 139, BNP 1091.1, Magnesium 2.4  Vital Signs: . Weight: 135 lbs (admission weight: 134 lbs) . Blood pressure: 130-150/70s . Heart rate: 80s  Medication Assistance / Insurance Benefits Check: Does the patient have prescription insurance?  Yes Type of insurance plan: Medicare - [employer]  Does the patient qualify for medication assistance through manufacturers or grants?   Pending . Eligible grants and/or patient assistance programs: pending . Medication assistance applications in progress: none  . Medication assistance applications approved: none Approved medication assistance renewals will be completed by: Dr. Irven Shelling office  Outpatient Pharmacy:  Prior to admission outpatient pharmacy: [facility] Is the patient willing to use [facility] at discharge? Yes Is the patient willing to transition their outpatient pharmacy to utilize a [facility] outpatient pharmacy?   Pending    Assessment: 1. Acute on chronic systolic CHF (EF 14-97%). NYHA class II symptoms. Back in NSR. BP and HR improved.  - Agree with restarting  metoprolol 25 mg daily - Agree with restarting losartan 25 mg daily - Pending BP and SCr trends, may be able to transition back to [place] and resume [place]: 1) Medication changes recommended at this time: - Agree with changes noted above  2) Patient assistance - Prior authorization for Delene Loll and Farxiga completed and approved - Entresto copay $61.87 - Farxiga copay $94.65 - Will discuss options for patient assistance through drug manufacturers if copay is too high for her  3)  Education  - To be completed prior to discharge  Kerby Nora, PharmD, BCPS Heart Failure Cytogeneticist Phone [phone]

## 2020-03-27 NOTE — Discharge Summary (Addendum)
Physician Discharge Summary  Heidi Adams IRW:[id number] DOB: [birth year] DOA: 03/21/2020  PCP: Heidi Baton, MD  Admit date: 03/21/2020 Discharge date: 03/27/2020  Admitted From: Home  Discharge disposition: Home with home Health   Recommendations for Outpatient Follow-Up:   . Follow up with your primary care provider in one week.  . Check CBC, BMP, magnesium in the next visit  . Follow up with Dr Heidi Adams, cardiology on 04/03/20   Discharge Diagnosis:   Principal Problem:   Atrial fibrillation with RVR ([place]) Active Problems:   Hypothyroidism   Essential hypertension   Pharyngoesophageal dysphagia   Malignant neoplasm of upper-inner quadrant of left breast in female, estrogen receptor positive ([place])   Acute cystitis   History of congestive heart failure   E coli bacteremia   A-fib (Heidi Adams)    Discharge Condition: Improved.  Diet recommendation: Low sodium, heart healthy.   Wound care: None.  Code status: Full.   History of Present Illness:   Patient  is a 84 year old female with past medical history of hypothyroidism, hypertension, atrial fibrillation, congestive heart failure presented to the hospital with shortness of breath for 2 to 3 days with palpitation, dizziness ,lightheadedness and sweating with chest discomfort.  In the ER, the patient was tachycardic,heart rate up to 150s. She also complained of chest pain. She was also noted to have a fever fever of 102F. The ER physician discussed the situation with the cardiologist Dr. Einar Adams andthe decision was made to cardiovert the patient. She tolerated the procedure well.  Post cardioversion patient was admitted to the hospital for further observation.  Patient was started on amiodarone, Entresto during hospitalization but subsequently had hypotension and A. fib with RVR despite being on amiodarone.  Received amiodarone IV push 03/25/2020 and beta-blockers, Entresto were discontinued for orthostatic hypotension and rapid  ventricular response.     Hospital Course:   Following conditions were addressed during hospitalization as listed below,  Paroxysmal atrial fibrillation with RVR She was cardioverted in the ER.Marland Kitchen Received IV fluid bolus for orthostatic hypotension and tachycardia on 03/25/2020.  Initially, metoprolol Entresto on hold.  Patient was started on amiodarone 200 mg twice a day after bolus and was changed to once a day.  Patient has been started on metoprolol succinate and losartan prior to discharge after discussing with cardiology.  Mild orthostatic hypotension.  Improved at this time.  Feels dizziness which is likely vestibular.  Physical therapy has seen the patient.  Will get Home  physical therapy on discharge.  chronic systolic congestive heart failure.  2D echocardiogram on 03/11/2020 showed LV ejection fraction of 15 to 20% with moderate aortic and mitral regurgitation.  Patient was initially started on Entresto and farxiza while in hospital but this was kept on hold due to hypotension. Patient will be arranged for home health on discharge including nursing for medication management.  Patient has been started on metoprolol and losartan on discharge.  Could start entresto as outpatient.  Chronic nocturnal use of oxygen 2L likely secondary to CHF.  Resume at home.  Essential hypertension Low-dose metoprolol and losartan has been initiated prior to discharge.    Hypothyroidism Continue Synthroid.  Acute E. coli cystitis with E. coli bacteremia. Sepsis was ruled out. Patient received IV cefazolin as per sensitivity.  Urine culture showing E. coli.  Blood cultures with E. coli bacteremia.  Plan to complete a 7-day course of antibiotic and will be given oral cephalexin on discharge to complete the course.  Trending down leukocytosis.  Hypokalemia improved with replacement.  Mild renal insufficiency .  Creatinine of 1.25  Debility, deconditioning.  Patient has been seen by physical  therapy and has ambulated in the hallway.  Will need home health on discharge  Disposition.  At this time, patient is stable for disposition home with home health. Spoke with the patient's husband regarding disposition home and follow up.  Medical Consultants:    Cardiology   Procedures:    DC cardioversion  Subjective:   Today, patient was seen and examined at bedside.  Patient feels okay no dizziness lightheadedness shortness of breath or chest pain.  Wants to go home.  Discharge Exam:   Vitals:   03/27/20 0855 03/27/20 1116  BP: 132/75 (!) 153/76  Pulse: 82   Resp: 18 20  Temp: 98.6 F (37 C)   SpO2:     Vitals:   03/27/20 0352 03/27/20 0607 03/27/20 0855 03/27/20 1116  BP: 138/72  132/75 (!) 153/76  Pulse: 72  82   Resp: 18  18 20   Temp: 98.5 F (36.9 C)  98.6 F (37 C)   TempSrc: Oral  Oral   SpO2:      Weight:  61.3 kg    Height:        General: Alert awake and communicative, not in obvious distress, thinly built, frail appearing female  HENT:   No scleral pallor or icterus noted. Oral mucosa is moist.  Chest:  Clear breath sounds.  Diminished breath sounds bilaterally. No crackles or wheezes.  CVS: S1 &S2 heard. No murmur.  Regular rhythm at this time Abdomen: Soft, nontender, nondistended.  Bowel sounds are heard.   Extremities: No cyanosis, clubbing but trace edema.  Peripheral pulses are palpable. Psych: Alert, awake and oriented, normal mood CNS:  No cranial nerve deficits.  Power equal in all extremities.   Skin: Warm and dry.  No rashes noted.   The results of significant diagnostics from this hospitalization (including imaging, microbiology, ancillary and laboratory) are listed below for reference.     Diagnostic Studies:   DG Chest Port 1 View  Result Date: 03/21/2020 CLINICAL DATA:  84 year old female with questionable sepsis. EXAM: PORTABLE CHEST 1 VIEW COMPARISON:  Chest radiograph dated 06/21/2017. FINDINGS: Mild chronic interstitial  coarsening. No focal consolidation, pleural effusion, or pneumothorax. Stable mild cardiomegaly. Atherosclerotic calcification of the aorta. No acute osseous pathology. IMPRESSION: No active disease. Electronically Signed   By: Anner Crete M.D.   On: 03/21/2020 21:45     Labs:   Basic Metabolic Panel: Recent Labs  Lab 03/23/20 0239 03/23/20 0239 03/24/20 0339 03/24/20 0339 03/25/20 0452 03/25/20 0452 03/26/20 0413 03/27/20 1458  NA 137  --  138  --  137  --  139 139  K 3.8   < > 3.9   < > 3.7   < > 3.7 3.7  CL 99  --  101  --  101  --  103 104  CO2 27  --  26  --  26  --  26 26  GLUCOSE 104*  --  106*  --  112*  --  93 100*  BUN 29*  --  27*  --  30*  --  25* 17  CREATININE 1.09*  --  1.00  --  1.25*  --  1.25* 0.99  CALCIUM 8.3*  --  8.3*  --  8.2*  --  8.0* 7.9*  MG 2.0  --  2.2  --  2.4  --  2.4 2.4  PHOS 2.8  --   --   --   --   --   --   --    < > = values in this interval not displayed.   GFR Estimated Creatinine Clearance: 38.5 mL/min (by C-G formula based on SCr of 0.99 mg/dL). Liver Function Tests: Recent Labs  Lab 03/21/20 2116 03/23/20 0239 03/24/20 0339  AST 37 26 31  ALT 33 27 28  ALKPHOS 80 80 79  BILITOT 0.4 0.3 0.5  PROT 5.8* 5.3* 5.1*  ALBUMIN 2.5* 2.2* 2.1*   No results for input(s): LIPASE, AMYLASE in the last 168 hours. No results for input(s): AMMONIA in the last 168 hours. Coagulation profile Recent Labs  Lab 03/21/20 2116 03/23/20 0239  INR 2.7* 2.8*    CBC: Recent Labs  Lab 03/23/20 0239 03/24/20 0339 03/25/20 0452 03/26/20 0413 03/27/20 1458  WBC 17.3* 16.1* 14.5* 14.0* 14.3*  HGB 7.9* 8.8* 9.9* 10.0* 9.0*  HCT 26.7* 30.1* 34.1* 33.5* 31.4*  MCV 79.0* 78.0* 76.8* 77.5* 77.9*  PLT 375 458* 505* 546* 449*   Cardiac Enzymes: No results for input(s): CKTOTAL, CKMB, CKMBINDEX, TROPONINI in the last 168 hours. BNP: Invalid input(s): POCBNP CBG: Recent Labs  Lab 03/21/20 2120  GLUCAP 132*   D-Dimer No results for  input(s): DDIMER in the last 72 hours. Hgb A1c No results for input(s): HGBA1C in the last 72 hours. Lipid Profile No results for input(s): CHOL, HDL, LDLCALC, TRIG, CHOLHDL, LDLDIRECT in the last 72 hours. Thyroid function studies No results for input(s): TSH, T4TOTAL, T3FREE, THYROIDAB in the last 72 hours.  Invalid input(s): FREET3 Anemia work up No results for input(s): VITAMINB12, FOLATE, FERRITIN, TIBC, IRON, RETICCTPCT in the last 72 hours. Microbiology Recent Results (from the past 240 hour(s))  SARS CORONAVIRUS 2 (TAT 6-24 HRS) Nasopharyngeal Nasopharyngeal Swab     Status: None   Collection Time: 03/20/20 11:33 AM   Specimen: Nasopharyngeal Swab  Result Value Ref Range Status   SARS Coronavirus 2 NEGATIVE NEGATIVE Final    Comment: (NOTE) SARS-CoV-2 target nucleic acids are NOT DETECTED.  The SARS-CoV-2 RNA is generally detectable in upper and lower respiratory specimens during the acute phase of infection. Negative results do not preclude SARS-CoV-2 infection, do not rule out co-infections with other pathogens, and should not be used as the sole basis for treatment or other patient management decisions. Negative results must be combined with clinical observations, patient history, and epidemiological information. The expected result is Negative.  Fact Sheet for Patients: SugarRoll.be  Fact Sheet for Healthcare Providers: https://www.woods-mathews.com/  This test is not yet approved or cleared by the [place] FDA and  has been authorized for detection and/or diagnosis of SARS-CoV-2 by FDA under an Emergency Use Authorization (EUA). This EUA will remain  in effect (meaning this test can be used) for the duration of the COVID-19 declaration under Se ction 564(b)(1) of the Act, 21 U.S.C. section 360bbb-3(b)(1), unless the authorization is terminated or revoked sooner.  Performed at [facility], [place] [address] [place], [place] [address]   SARS Coronavirus 2 by RT PCR (hospital order, performed in [facility] hospital lab) Nasopharyngeal Nasopharyngeal Swab     Status: None   Collection Time: 03/21/20  9:43 PM   Specimen: Nasopharyngeal Swab  Result Value Ref Range Status   SARS Coronavirus 2 NEGATIVE NEGATIVE Final    Comment: (NOTE) SARS-CoV-2 target nucleic acids are NOT DETECTED.  The SARS-CoV-2 RNA is generally detectable  in upper and lower respiratory specimens during the acute phase of infection. The lowest concentration of SARS-CoV-2 viral copies this assay can detect is 250 copies / mL. A negative result does not preclude SARS-CoV-2 infection and should not be used as the sole basis for treatment or other patient management decisions.  A negative result may occur with improper specimen collection / handling, submission of specimen other than nasopharyngeal swab, presence of viral mutation(s) within the areas targeted by this assay, and inadequate number of viral copies (<250 copies / mL). A negative result must be combined with clinical observations, patient history, and epidemiological information.  Fact Sheet for Patients:   StrictlyIdeas.no  Fact Sheet for Healthcare Providers: BankingDealers.co.za  This test is not yet approved or  cleared by the [place] FDA and has been authorized for detection and/or diagnosis of SARS-CoV-2 by FDA under an Emergency Use Authorization (EUA).  This EUA will remain in effect (meaning this test can be used) for the duration of the COVID-19 declaration under Section 564(b)(1) of the Act, 21 U.S.C. section 360bbb-3(b)(1), unless the authorization is terminated or revoked sooner.  Performed at [facility], [place] [address], [place], [place] [address]   Blood Culture (routine x 2)     Status: None   Collection Time: 03/21/20  9:48 PM   Specimen: BLOOD  Result Value Ref Range Status    Specimen Description BLOOD SITE NOT SPECIFIED  Final   Special Requests   Final    BOTTLES DRAWN AEROBIC AND ANAEROBIC Blood Culture results may not be optimal due to an inadequate volume of blood received in culture bottles   Culture   Final    NO GROWTH 5 DAYS Performed at [facility], [place] [address] [place], [place] [address]    Report Status 03/26/2020 FINAL  Final  Urine culture     Status: Abnormal   Collection Time: 03/21/20 10:43 PM   Specimen: In/Out Cath Urine  Result Value Ref Range Status   Specimen Description IN/OUT CATH URINE  Final   Special Requests   Final    NONE Performed at [facility], [place] [address], [place], [place] [address]    Culture >=100,000 COLONIES/mL ESCHERICHIA COLI (A)  Final   Report Status 03/24/2020 FINAL  Final   Organism ID, Bacteria ESCHERICHIA COLI (A)  Final      Susceptibility   Escherichia coli - MIC*    AMPICILLIN >=32 RESISTANT Resistant     CEFAZOLIN <=4 SENSITIVE Sensitive     CEFTRIAXONE <=0.25 SENSITIVE Sensitive     CIPROFLOXACIN <=0.25 SENSITIVE Sensitive     GENTAMICIN <=1 SENSITIVE Sensitive     IMIPENEM <=0.25 SENSITIVE Sensitive     NITROFURANTOIN <=16 SENSITIVE Sensitive     TRIMETH/SULFA <=20 SENSITIVE Sensitive     AMPICILLIN/SULBACTAM >=32 RESISTANT Resistant     PIP/TAZO <=4 SENSITIVE Sensitive     * >=100,000 COLONIES/mL ESCHERICHIA COLI  Blood Culture (routine x 2)     Status: Abnormal   Collection Time: 03/21/20 11:49 PM   Specimen: BLOOD LEFT HAND  Result Value Ref Range Status   Specimen Description BLOOD LEFT HAND  Final   Special Requests   Final    BOTTLES DRAWN AEROBIC AND ANAEROBIC Blood Culture adequate volume   Culture  Setup Time   Final    GRAM NEGATIVE RODS IN BOTH AEROBIC AND ANAEROBIC BOTTLES Organism ID to follow CRITICAL RESULT CALLED TO, READ BACK BY AND VERIFIED WITH:  Avinger, AT 1610 03/22/20 BY Rush Landmark Performed at [facility], [place] [address],  [place], [place] [address]    Culture ESCHERICHIA COLI (A)  Final   Report Status 03/24/2020 FINAL  Final   Organism ID, Bacteria ESCHERICHIA COLI  Final      Susceptibility   Escherichia coli - MIC*    AMPICILLIN >=32 RESISTANT Resistant     CEFAZOLIN <=4 SENSITIVE Sensitive     CEFEPIME <=0.12 SENSITIVE Sensitive     CEFTAZIDIME <=1 SENSITIVE Sensitive     CEFTRIAXONE <=0.25 SENSITIVE Sensitive     CIPROFLOXACIN <=0.25 SENSITIVE Sensitive     GENTAMICIN <=1 SENSITIVE Sensitive     IMIPENEM <=0.25 SENSITIVE Sensitive     TRIMETH/SULFA <=20 SENSITIVE Sensitive     AMPICILLIN/SULBACTAM >=32 RESISTANT Resistant     PIP/TAZO <=4 SENSITIVE Sensitive     * ESCHERICHIA COLI  Blood Culture ID Panel (Reflexed)     Status: Abnormal   Collection Time: 03/21/20 11:49 PM  Result Value Ref Range Status   Enterococcus faecalis NOT DETECTED NOT DETECTED Final   Enterococcus Faecium NOT DETECTED NOT DETECTED Final   Listeria monocytogenes NOT DETECTED NOT DETECTED Final   Staphylococcus species NOT DETECTED NOT DETECTED Final   Staphylococcus aureus (BCID) NOT DETECTED NOT DETECTED Final   Staphylococcus epidermidis NOT DETECTED NOT DETECTED Final   Staphylococcus lugdunensis NOT DETECTED NOT DETECTED Final   Streptococcus species NOT DETECTED NOT DETECTED Final   Streptococcus agalactiae NOT DETECTED NOT DETECTED Final   Streptococcus pneumoniae NOT DETECTED NOT DETECTED Final   Streptococcus pyogenes NOT DETECTED NOT DETECTED Final   A.calcoaceticus-baumannii NOT DETECTED NOT DETECTED Final   Bacteroides fragilis NOT DETECTED NOT DETECTED Final   Enterobacterales DETECTED (A) NOT DETECTED Final    Comment: Enterobacterales represent a large order of gram negative bacteria, not a single organism. CRITICAL RESULT CALLED TO, READ BACK BY AND VERIFIED WITH: H. VON DOHLEN PHARMD, AT 1510 03/22/20 BY D. VANHOOK    Enterobacter cloacae complex NOT DETECTED NOT DETECTED Final   Escherichia coli DETECTED  (A) NOT DETECTED Final    Comment: CRITICAL RESULT CALLED TO, READ BACK BY AND VERIFIED WITH: H. VON DOHLEN PHARMD, AT 1510 03/22/20 BY D. VANHOOK    Klebsiella aerogenes NOT DETECTED NOT DETECTED Final   Klebsiella oxytoca NOT DETECTED NOT DETECTED Final   Klebsiella pneumoniae NOT DETECTED NOT DETECTED Final   Proteus species NOT DETECTED NOT DETECTED Final   Salmonella species NOT DETECTED NOT DETECTED Final   Serratia marcescens NOT DETECTED NOT DETECTED Final   Haemophilus influenzae NOT DETECTED NOT DETECTED Final   Neisseria meningitidis NOT DETECTED NOT DETECTED Final   Pseudomonas aeruginosa NOT DETECTED NOT DETECTED Final   Stenotrophomonas maltophilia NOT DETECTED NOT DETECTED Final   Candida albicans NOT DETECTED NOT DETECTED Final   Candida auris NOT DETECTED NOT DETECTED Final   Candida glabrata NOT DETECTED NOT DETECTED Final   Candida krusei NOT DETECTED NOT DETECTED Final   Candida parapsilosis NOT DETECTED NOT DETECTED Final   Candida tropicalis NOT DETECTED NOT DETECTED Final   Cryptococcus neoformans/gattii NOT DETECTED NOT DETECTED Final   CTX-M ESBL NOT DETECTED NOT DETECTED Final   Carbapenem resistance IMP NOT DETECTED NOT DETECTED Final   Carbapenem resistance KPC NOT DETECTED NOT DETECTED Final   Carbapenem resistance NDM NOT DETECTED NOT DETECTED Final   Carbapenem resist OXA 48 LIKE NOT DETECTED NOT DETECTED Final   Carbapenem resistance VIM NOT DETECTED  NOT DETECTED Final    Comment: Performed at [facility], [place] [address] [place], [place] [address]     Discharge Instructions:   Discharge Instructions     Diet - low sodium heart healthy   Complete by: As directed    Discharge instructions   Complete by: As directed    Follow-up with your primary care physician in 1 week.  Complete the course of antibiotic.  Follow-up with Dr. Einar Adams cardiology in 1 to 2 weeks. Medications have been changed.  Continue to weigh yourself every day.  Fluid  restriction 1500 mL/day.  Please take time to stand up from lying position.   Increase activity slowly   Complete by: As directed       Allergies as of 03/27/2020       Reactions   Codeine Nausea Only   Epinephrine    Tachcardyia, chest pains tremors   Tenormin [atenolol] Rash        Medication List     STOP taking these medications    lidocaine 4 % cream Commonly known as: LMX   metoprolol tartrate 25 MG tablet Commonly known as: LOPRESSOR       TAKE these medications    acetaminophen 500 MG tablet Commonly known as: TYLENOL Take 1,000 mg by mouth 3 (three) times daily as needed for moderate pain.   amiodarone 200 MG tablet Commonly known as: PACERONE Take 1 tablet (200 mg total) by mouth daily. Start taking on: March 28, 2020   apixaban 5 MG Tabs tablet Commonly known as: Eliquis Take 1 tablet (5 mg total) by mouth 2 (two) times daily.   cephALEXin 500 MG capsule Commonly known as: KEFLEX Take 1 capsule (500 mg total) by mouth 3 (three) times daily for 3 days. Through 03/28/2020   furosemide 40 MG tablet Commonly known as: LASIX Take 1 tablet (40 mg total) by mouth daily. Start taking on: March 28, 2020 What changed: when to take this   levothyroxine 112 MCG tablet Commonly known as: SYNTHROID Take 112-168 mcg by mouth daily before breakfast. Take 1 tablet (112 mcg) on Mondays, Tuesdays, Wednesdays, Thursdays, Fridays & Saturdays. Take 1.5 tablet (168 mcg) on Sundays   losartan 25 MG tablet Commonly known as: COZAAR Take 1 tablet (25 mg total) by mouth at bedtime.   meclizine 25 MG tablet Commonly known as: ANTIVERT Take 1 tablet (25 mg total) by mouth 3 (three) times daily as needed for dizziness.   metoprolol succinate 25 MG 24 hr tablet Commonly known as: TOPROL-XL Take 1 tablet (25 mg total) by mouth daily.   pantoprazole 40 MG tablet Commonly known as: PROTONIX Take 40 mg by mouth daily as needed (indigestion/heartburn.).     potassium chloride 10 MEQ tablet Commonly known as: KLOR-CON Take 1 tablet (10 mEq total) by mouth 2 (two) times daily.        Follow-up Information     Adrian Prows, MD Follow up on 04/03/2020.   Specialty: Cardiology Why: 04/03/2020 2:15 PM. Bring all home medications to the appointment Contact information: [address] [place] [address] [place], [place] Follow up.   Specialty: Home Health Services Why: Physical Therapy and aide.  Office to call with visit time. Contact information: [place] [address] [phone]  Time coordinating discharge: 39 minutes  Signed:  Larah Kuntzman  [facility] 03/27/2020, 4:15 PM

## 2020-03-27 NOTE — Discharge Instructions (Signed)

## 2020-03-27 NOTE — TOC Initial Note (Signed)
Transition of Care [facility]) - Initial/Assessment Note    Patient Details  Name: Heidi Adams MRN: [id number] Date of Birth: 02/20/1936  Transition of Care [facility]) CM/SW Contact:    Hyman Hopes, RN Phone Number: 03/27/2020, 12:48 PM  Clinical Narrative: Patient presented for Atrial Fib. Prior to arrival patient was from home with spouse. Patient has durable medical equipment rolling walker, oxygen- 2 Liter qhs, and bedside commode. Physical Therapy recommendations for home health PT and Aide. Medicare.gov list provided list provided to patient and patient had no preference. Patient states she will have 24 hour supervision in the home. Case Manager called [place] with [facility] and start of care to begin within 24-48 hours post transition home-specified (Monday start date). Patient will need home health orders and F2F once stable to transition home. Patient states she will transition home via private vehicle.  Case Manager will follow for additional transition of care needs.               Expected Discharge Plan: [place] Barriers to Discharge: No Barriers Identified   Patient Goals and CMS Choice Patient states their goals for this hospitalization and ongoing recovery are:: to go home with husband and home health. CMS Medicare.gov Compare Post Acute Care list provided to:: Patient Choice offered to / list presented to : Patient  Expected Discharge Plan and Services Expected Discharge Plan: [place] In-house Referral: NA Discharge Planning Services: CM Consult Post Acute Care Choice: [place] arrangements for the past 2 months: Single Family Home                 DME Arranged: N/A DME Agency: NA       HH Arranged: Nurse's Aide, PT HH Agency: [facility] Date [place]: 03/27/20 Time [place]: 1215 Representative spoke with at [place]: [place]  Prior Living Arrangements/Services Living arrangements for  the past 2 months: [place] with:: Self, Spouse Patient language and need for interpreter reviewed:: No Do you feel safe going back to the place where you live?: Yes      Need for Family Participation in Patient Care: Yes (Comment) Care giver support system in place?: Yes (comment) Current home services: DME (has oxygen, bedside commode rolling walker) Criminal Activity/Legal Involvement Pertinent to Current Situation/Hospitalization: No - Comment as needed  Activities of Daily Living Home Assistive Devices/Equipment: Eyeglasses, Bedside commode/3-in-1 ADL Screening (condition at time of admission) Patient's cognitive ability adequate to safely complete daily activities?: Yes Is the patient deaf or have difficulty hearing?: No Does the patient have difficulty seeing, even when wearing glasses/contacts?: No Does the patient have difficulty concentrating, remembering, or making decisions?: No Patient able to express need for assistance with ADLs?: Yes Does the patient have difficulty dressing or bathing?: Yes Independently performs ADLs?: No Communication: Independent Dressing (OT): Needs assistance Is this a change from baseline?: Change from baseline, expected to last >3 days Grooming: Needs assistance Is this a change from baseline?: Change from baseline, expected to last >3 days Feeding: Independent Bathing: Needs assistance Is this a change from baseline?: Change from baseline, expected to last >3 days Toileting: Needs assistance Is this a change from baseline?: Change from baseline, expected to last >3days In/Out Bed: Needs assistance Is this a change from baseline?: Change from baseline, expected to last >3 days Walks in Home: Needs assistance Is this a change from baseline?: Change from baseline, expected to last >3  days Does the patient have difficulty walking or climbing stairs?: Yes Weakness of Legs: Both Weakness of Arms/Hands: None  Permission  Sought/Granted Permission sought to share information with : Case Manager Permission granted to share information with : Yes, Verbal Permission Granted     Permission granted to share info w AGENCY: [facility]        Emotional Assessment Appearance:: Appears stated age Attitude/Demeanor/Rapport: Engaged Affect (typically observed): Calm Orientation: : Oriented to Self, Oriented to Place, Oriented to  Time, Oriented to Situation Alcohol / Substance Use: Never Used Psych Involvement: No (comment)  Admission diagnosis:  Elevated troponin [R77.8] Atrial fibrillation with RVR ([place]) [I48.91] Sepsis without acute organ dysfunction, due to unspecified organism ([place]) [A41.9] A-fib ([place]) [I48.91] Patient Active Problem List   Diagnosis Date Noted  . E coli bacteremia 03/23/2020  . A-fib ([place]) 03/23/2020  . Acute cystitis 03/22/2020  . Atrial fibrillation with RVR ([place]) 03/22/2020  . History of congestive heart failure 03/22/2020  . Elevated troponin   . Dizziness 06/22/2019  . Arthritis of wrist 05/05/2019  . DDD (degenerative disc disease), cervical 08/20/2018  . Left elbow pain 08/02/2018  . Neck pain 08/02/2018  . TIA (transient ischemic attack) 07/30/2018  . Hypertensive urgency 07/30/2018  . AF (paroxysmal atrial fibrillation) ([employer]) 07/30/2018  . Arthritis of carpometacarpal [facility]) joint of left thumb 06/20/2018  . Pain in thumb joint with movement of left hand 05/08/2018  . Pain in finger of right hand 01/02/2018  . Pain in left knee 11/08/2017  . Conductive hearing loss, bilateral 10/19/2017  . Dry nose 10/19/2017  . Chronic hip pain after total replacement of left hip joint 08/25/2017  . Trochanteric bursitis of left hip 08/25/2017  . Localized, primary osteoarthritis of elbow 08/22/2017  . Synovitis and tenosynovitis 08/11/2017  . Malignant neoplasm of upper-inner quadrant of left breast in female, estrogen receptor positive ([place]) 06/20/2017  . S/P closed reduction of  dislocated total hip prosthesis 02/03/2017  . S/P left THA, AA 01/10/2017  . Fatigue 05/31/2016  . Overweight (BMI 25.0-29.9) 05/11/2016  . S/P right THA, AA 05/10/2016  . Right hip pain 03/16/2016  . Pes planus 03/16/2016  . Abnormality of gait 03/16/2016  . PAC (premature atrial contraction) 02/24/2016  . PVC (premature ventricular contraction) 02/24/2016  . Palpitations 01/27/2016  . Benign paroxysmal positional vertigo of right ear 12/15/2015  . Pharyngoesophageal dysphagia 12/15/2015  . Hypothyroidism 11/26/2015  . Essential hypertension 11/26/2015  . S/P left TKA 09/28/2015  . Lichen sclerosus 07/27/7251   PCP:  [name], MD Pharmacy:   [facility], [place] - [address] [place] [address] Phone: [phone] Fax: [phone]  [facility] [place], [place] - [address] [place] [address] Phone: [phone] Fax: [phone]   Readmission Risk Interventions No flowsheet data found.

## 2020-03-28 DIAGNOSIS — I5032 Chronic diastolic (congestive) heart failure: Secondary | ICD-10-CM | POA: Diagnosis not present

## 2020-03-28 DIAGNOSIS — I10 Essential (primary) hypertension: Secondary | ICD-10-CM | POA: Diagnosis not present

## 2020-03-28 DIAGNOSIS — I480 Paroxysmal atrial fibrillation: Secondary | ICD-10-CM | POA: Diagnosis not present

## 2020-04-01 DIAGNOSIS — I110 Hypertensive heart disease with heart failure: Secondary | ICD-10-CM | POA: Diagnosis not present

## 2020-04-01 DIAGNOSIS — Z96653 Presence of artificial knee joint, bilateral: Secondary | ICD-10-CM | POA: Diagnosis not present

## 2020-04-01 DIAGNOSIS — M1990 Unspecified osteoarthritis, unspecified site: Secondary | ICD-10-CM | POA: Diagnosis not present

## 2020-04-01 DIAGNOSIS — I959 Hypotension, unspecified: Secondary | ICD-10-CM | POA: Diagnosis not present

## 2020-04-01 DIAGNOSIS — I5042 Chronic combined systolic (congestive) and diastolic (congestive) heart failure: Secondary | ICD-10-CM | POA: Diagnosis not present

## 2020-04-01 DIAGNOSIS — Z9981 Dependence on supplemental oxygen: Secondary | ICD-10-CM | POA: Diagnosis not present

## 2020-04-01 DIAGNOSIS — Z9049 Acquired absence of other specified parts of digestive tract: Secondary | ICD-10-CM | POA: Diagnosis not present

## 2020-04-01 DIAGNOSIS — C50212 Malignant neoplasm of upper-inner quadrant of left female breast: Secondary | ICD-10-CM | POA: Diagnosis not present

## 2020-04-01 DIAGNOSIS — Z85828 Personal history of other malignant neoplasm of skin: Secondary | ICD-10-CM | POA: Diagnosis not present

## 2020-04-01 DIAGNOSIS — Z9181 History of falling: Secondary | ICD-10-CM | POA: Diagnosis not present

## 2020-04-01 DIAGNOSIS — I4891 Unspecified atrial fibrillation: Secondary | ICD-10-CM | POA: Diagnosis not present

## 2020-04-01 DIAGNOSIS — Z7901 Long term (current) use of anticoagulants: Secondary | ICD-10-CM | POA: Diagnosis not present

## 2020-04-01 DIAGNOSIS — Z87891 Personal history of nicotine dependence: Secondary | ICD-10-CM | POA: Diagnosis not present

## 2020-04-01 DIAGNOSIS — D630 Anemia in neoplastic disease: Secondary | ICD-10-CM | POA: Diagnosis not present

## 2020-04-01 DIAGNOSIS — Z96643 Presence of artificial hip joint, bilateral: Secondary | ICD-10-CM | POA: Diagnosis not present

## 2020-04-01 DIAGNOSIS — E039 Hypothyroidism, unspecified: Secondary | ICD-10-CM | POA: Diagnosis not present

## 2020-04-03 ENCOUNTER — Encounter: Payer: Self-pay | Admitting: Cardiology

## 2020-04-03 ENCOUNTER — Ambulatory Visit: Payer: PPO | Admitting: Cardiology

## 2020-04-03 ENCOUNTER — Other Ambulatory Visit: Payer: Self-pay

## 2020-04-03 VITALS — BP 169/84 | HR 66 | Resp 16 | Ht 64.0 in | Wt 133.0 lb

## 2020-04-03 DIAGNOSIS — I480 Paroxysmal atrial fibrillation: Secondary | ICD-10-CM | POA: Diagnosis not present

## 2020-04-03 DIAGNOSIS — I10 Essential (primary) hypertension: Secondary | ICD-10-CM

## 2020-04-03 DIAGNOSIS — I5023 Acute on chronic systolic (congestive) heart failure: Secondary | ICD-10-CM | POA: Diagnosis not present

## 2020-04-03 DIAGNOSIS — I951 Orthostatic hypotension: Secondary | ICD-10-CM | POA: Diagnosis not present

## 2020-04-03 MED ORDER — POTASSIUM CHLORIDE CRYS ER 10 MEQ PO TBCR: 10.0000 meq | EXTENDED_RELEASE_TABLET | Freq: Two times a day (BID) | ORAL | 1 refills | Status: DC | PRN

## 2020-04-03 MED ORDER — AMIODARONE HCL 200 MG PO TABS: 200.0000 mg | ORAL_TABLET | Freq: Every day | ORAL | 1 refills | Status: DC

## 2020-04-03 MED ORDER — FUROSEMIDE 40 MG PO TABS: 40.0000 mg | ORAL_TABLET | ORAL | Status: DC

## 2020-04-03 NOTE — Progress Notes (Signed)
Primary Physician/Referring:  [name], MD  Patient ID: Heidi Adams, female    DOB: 02/25/1936, 84 y.o.   MRN: [id number]  Chief Complaint  Patient presents with  . Follow-up    2 week  . Acute on chronic systolic heart failure   HPI:    Heidi Adams  is a 84 y.o. female  with bilateral hip replacement and knee replacement and left breast lumpectomy in 2018 without chemotherapy or radiation therapy for breast cancer and also remote hysterectomy due to uterine cancer in 1991, supine hypertension and severe orthostatic hypotension. Has not been able to tolerate any BP medications due to severe dizzy spells.   Admitted to the hospital on 03/21/2020 and discharged on 03/27/2020 with E. coli urosepsis and bacteremia, on atrial fibrillation with RVR.  She underwent direct-current cardioversion in the emergency room and converted to sinus rhythm with frequent PACs and brief atrial tachycardia.  With regard to cardiomyopathy and chronic systolic heart failure, she was initially started on Entresto and also for [place] but developed severe hypotension hence had to be discontinued.  She now presents for follow-up.  States that her energy level is improved, she has not had any further dizziness and her husband states that she has been getting along relatively well at home.  She has home health and physical therapy.  Past Medical History:  Diagnosis Date  . Allergy   . Anemia    hx of anemia  . Arthritis   . Breast cancer ([place])    Left breast  . Cancer ([place])    skin ca nose  . Chronic combined systolic and diastolic CHF (congestive heart failure) ([place]) 09/22/2018  . Chronic combined systolic and diastolic CHF (congestive heart failure) ([place]) 09/22/2018  . Chronic combined systolic and diastolic CHF (congestive heart failure) ([place]) 09/22/2018  . Dysrhythmia    went into Atrial Fibrillation after last surgery, HAS PALPITATIONS  . History of skin cancer   . Hypertension   . Hypothyroidism   .  PVC (premature ventricular contraction)   . Thyroiditis 1973   Past Surgical History:  Procedure Laterality Date  . ABDOMINAL HYSTERECTOMY  89   TAH BSO  . APPENDECTOMY    . BREAST LUMPECTOMY Left 05/30/2017  . BREAST LUMPECTOMY WITH RADIOACTIVE SEED LOCALIZATION Left 05/30/2017   Procedure: LEFT BREAST LUMPECTOMY WITH RADIOACTIVE SEED LOCALIZATION;  Surgeon: Excell Seltzer, MD;  Location: [facility];  Service: General;  Laterality: Left;  . CATARACT EXTRACTION  09/10/2018   Right eye  . CHOLECYSTECTOMY     dr Georgia Lopes  . ELBOW SURGERY    . EYE SURGERY     "on my eyelids" years ago  . KNEE ARTHROSCOPY     left  . THYROIDECTOMY  1973  . TONSILLECTOMY    . TOTAL HIP ARTHROPLASTY Right 05/10/2016   Procedure: TOTAL HIP ARTHROPLASTY ANTERIOR APPROACH;  Surgeon: Paralee Cancel, MD;  Location: [facility];  Service: Orthopedics;  Laterality: Right;  . TOTAL HIP ARTHROPLASTY Left 01/10/2017   Procedure: LEFT TOTAL HIP ARTHROPLASTY ANTERIOR APPROACH;  Surgeon: Paralee Cancel, MD;  Location: [facility];  Service: Orthopedics;  Laterality: Left;  . TOTAL KNEE ARTHROPLASTY Left 09/28/2015   Procedure: LEFT TOTAL KNEE ARTHROPLASTY;  Surgeon: Paralee Cancel, MD;  Location: [facility];  Service: Orthopedics;  Laterality: Left;  . TOTAL KNEE ARTHROPLASTY Right 08/09/2016   Procedure: RIGHT TOTAL KNEE ARTHROPLASTY;  Surgeon: Paralee Cancel, MD;  Location: [facility];  Service: Orthopedics;  Laterality: Right;  Adductor Block  . Total Left hip arthroplasty     01/10/17 Dr. Alvan Dame   Family History  Problem Relation Age of Onset  . Diabetes Father   . Hypertension Father   . Stroke Father   . Breast cancer Cousin   . Pneumonia Sister     Social History   Tobacco Use  . Smoking status: Former Smoker    Packs/day: 1.00    Years: 20.00    Pack years: 20.00    Types: Cigarettes    Quit date: 07/26/1987    Years since quitting: 32.7  . Smokeless tobacco: Never Used  Substance Use Topics  . Alcohol use:  No    Alcohol/week: 0.0 standard drinks   Marital Status: Married  ROS  Review of Systems  Constitutional: Positive for malaise/fatigue. Negative for diaphoresis and weight gain.  Cardiovascular: Positive for chest pain, dyspnea on exertion, leg swelling (intermittent, improved with support stockings) and orthopnea. Negative for palpitations and syncope.  Respiratory: Positive for shortness of breath. Negative for cough.   Musculoskeletal: Positive for arthritis and joint pain. Negative for joint swelling.  Gastrointestinal: Negative for nausea and vomiting.  Neurological: Positive for light-headedness.   Objective  Blood pressure (!) 169/84, Adams 66, resp. rate 16, height 5\' 4"  (1.626 m), weight 133 lb (60.3 kg), last menstrual period 07/25/1988, SpO2 98 %.  Vitals with BMI 04/03/2020 03/27/2020 03/27/2020  Height 5\' 4"  - -  Weight 133 lbs - -  BMI 13.24 - -  Systolic 401 027 253  Diastolic 84 76 75  Adams 66 - 82     Physical Exam Constitutional:      General: She is not in acute distress.    Appearance: She is ill-appearing.     Comments: Moderately build, frail  Cardiovascular:     Rate and Rhythm: Normal rate and regular rhythm.     Pulses: Intact distal pulses.          Radial pulses are 2+ on the right side and 2+ on the left side.       Dorsalis pedis pulses are 1+ on the right side and 1+ on the left side.       Posterior tibial pulses are 1+ on the right side and 1+ on the left side.     Heart sounds: S1 normal and S2 normal. No murmur heard.  No gallop.      Comments: NO JVD. Trace pedal edema Pulmonary:     Effort: No accessory muscle usage or respiratory distress.     Breath sounds: Examination of the right-lower field reveals rhonchi. Examination of the left-lower field reveals rhonchi. Rhonchi (bibasilar coarse crackles) present.    Laboratory examination:   Recent Labs    03/25/20 0452 03/26/20 0413 03/27/20 1458  NA 137 139 139  K 3.7 3.7 3.7  CL 101  103 104  CO2 26 26 26   GLUCOSE 112* 93 100*  BUN 30* 25* 17  CREATININE 1.25* 1.25* 0.99  CALCIUM 8.2* 8.0* 7.9*  GFRNONAA 40* 40* 53*  GFRAA 47* 47* >60   estimated creatinine clearance is 38.5 mL/min (by C-G formula based on SCr of 0.99 mg/dL).  CMP Latest Ref Rng & Units 03/27/2020 03/26/2020 03/25/2020  Glucose 70 - 99 mg/dL 100(H) 93 112(H)  BUN 8 - 23 mg/dL 17 25(H) 30(H)  Creatinine 0.44 - 1.00 mg/dL 0.99 1.25(H) 1.25(H)  Sodium 135 - 145 mmol/L 139 139 137  Potassium 3.5 - 5.1 mmol/L 3.7 3.7 3.7  Chloride 98 - 111 mmol/L 104 103 101  CO2 22 - 32 mmol/L 26 26 26   Calcium 8.9 - 10.3 mg/dL 7.9(L) 8.0(L) 8.2(L)  Total Protein 6.5 - 8.1 g/dL - - -  Total Bilirubin 0.3 - 1.2 mg/dL - - -  Alkaline Phos 38 - 126 U/L - - -  AST 15 - 41 U/L - - -  ALT 0 - 44 U/L - - -   CBC Latest Ref Rng & Units 03/27/2020 03/26/2020 03/25/2020  WBC 4.0 - 10.5 K/uL 14.3(H) 14.0(H) 14.5(H)  Hemoglobin 12.0 - 15.0 g/dL 9.0(L) 10.0(L) 9.9(L)  Hematocrit 36 - 46 % 31.4(L) 33.5(L) 34.1(L)  Platelets 150 - 400 K/uL 449(H) 546(H) 505(H)   Lipid Panel     Component Value Date/Time   CHOL 142 07/31/2018 0655   TRIG 72 07/31/2018 0655   HDL 45 07/31/2018 0655   CHOLHDL 3.2 07/31/2018 0655   VLDL 14 07/31/2018 0655   LDLCALC 83 07/31/2018 0655   HEMOGLOBIN A1C Lab Results  Component Value Date   HGBA1C 5.0 07/31/2018   MPG 96.8 07/31/2018   TSH Recent Labs    03/21/20 2329  TSH 5.095*   BNP (last 3 results) Recent Labs    03/20/20 1043 03/21/20 2116  BNP 874.3* 1,091.1*    External labs:   Lipid Panel completed 04/09/2019 HDL 38 MG/DL 04/09/2019 LDL 80.000 mg 04/09/2019 Cholesterol, total 143.000 m 04/09/2019 Triglycerides 127.000 04/09/2019  A1C 5.000 07/31/2018  Glucose Random 110.000 m 11/04/2019 MicroAlbumin Urine 9.000 04/25/2019 MicroAlbumin/Creat 16.8 MG/ 04/25/2019 BUN 18.000 mg 11/04/2019 Creatinine, Serum 0.700 mg/ 11/04/2019  TSH 2.620 micr 11/04/2019  Medications and allergies    Allergies  Allergen Reactions  . Codeine Nausea Only  . Epinephrine     Tachcardyia, chest pains tremors  . Tenormin [Atenolol] Rash    Outpatient Medications Prior to Visit  Medication Sig Dispense Refill  . acetaminophen (TYLENOL) 500 MG tablet Take 1,000 mg by mouth 3 (three) times daily as needed for moderate pain.     Marland Kitchen apixaban (ELIQUIS) 5 MG TABS tablet Take 1 tablet (5 mg total) by mouth 2 (two) times daily. 180 tablet 3  . levothyroxine (SYNTHROID, LEVOTHROID) 112 MCG tablet Take 112-168 mcg by mouth daily before breakfast. Take 1 tablet (112 mcg) on Mondays, Tuesdays, Wednesdays, Thursdays, Fridays & Saturdays. Take 1.5 tablet (168 mcg) on Sundays    . losartan (COZAAR) 25 MG tablet Take 1 tablet (25 mg total) by mouth at bedtime. 30 tablet 2  . metoprolol succinate (TOPROL-XL) 25 MG 24 hr tablet Take 1 tablet (25 mg total) by mouth daily. 30 tablet 2  . pantoprazole (PROTONIX) 40 MG tablet Take 40 mg by mouth daily as needed (indigestion/heartburn.).     Marland Kitchen amiodarone (PACERONE) 200 MG tablet Take 1 tablet (200 mg total) by mouth daily. 30 tablet 2  . potassium chloride (KLOR-CON) 10 MEQ tablet Take 1 tablet (10 mEq total) by mouth 2 (two) times daily. 60 tablet 1  . furosemide (LASIX) 40 MG tablet Take 1 tablet (40 mg total) by mouth daily. (Patient not taking: Reported on 04/03/2020)    . meclizine (ANTIVERT) 25 MG tablet Take 1 tablet (25 mg total) by mouth 3 (three) times daily as needed for dizziness. (Patient not taking: Reported on 04/03/2020) 30 tablet 0   No facility-administered medications prior to visit.    Radiology:   No results found.  Cardiac Studies:   Lexiscan Myoview stress test  04/23/2018: 1. The resting electrocardiogram  demonstrated normal sinus rhythm, normal resting conduction and no resting arrhythmias.  Stress EKG is non-diagnostic for ischemia as it a pharmacologic stress using Lexiscan. Occasional PAC and PVC. The patient developed significant  symptoms which included Chest Pain, Stomach pain, Headache.  2. The overall quality of the study is good. There is no evidence of abnormal lung activity. Stress and rest SPECT images demonstrate homogeneous tracer distribution throughout the myocardium. Gated SPECT imaging reveals diffuse global hypokinesis. The left ventricular ejection fraction was markedly depressed at (26%).   3. This is a high risk study due to severe LV systolic dysfunction. Findings may represent non ischemic cardiomyopathy.  Echocardiogram 03/10/2020:  Left ventricle cavity is normal in size. Moderate concentric hypertrophy of the left ventricle. Severe global hypokinesis. LVEF 15-20%. Doppler evidence of grade II (pseudonormal) diastolic dysfunction, elevated LAP.  The aortic root is mildly dilated 3.9 cm.  Left atrial cavity is severely dilated at 65 cc/m2.  Trileaflet aortic valve. Moderate (Grade II) aortic regurgitation.  Moderate (Grade III) mitral regurgitation.  Mild tricuspid regurgitation. Estimated pulmonary artery systolic pressure 38 mmHg.  Mild pulmonic regurgitation.  Previous study on 07/30/2018, EF 40% (reported normal)    EKG  EKG 03/25/2020: Atrial fibrillation with rapid ventricular response at the rate of 140 bpm, left axis deviation, left intrafascicular block.  Poor R wave progression, cannot exclude anteroseptal infarct old.  Nonspecific ST-T abnormality, cannot exclude lateral ischemia.  EKG 01/10/2020: Normal sinus rhythm with rate of 95 bpm, left axis deviation, left anterior fascicular block.  Poor R wave progression, cannot exclude anteroseptal infarct old.  No evidence of ischemia. No significant change from EKG 11/29/2019    09/24/2019: Normal sinus rhythm at 93 bpm with occasional PAC, left axis deviation, PRWP cannot exclude anterior infarct old. ST abnormality in lateral leads, cannot exclude ischemia   Assessment     ICD-10-CM   1. Acute on chronic systolic heart failure (HCC)  I50.23  furosemide (LASIX) 40 MG tablet    potassium chloride (KLOR-CON) 10 MEQ tablet  2. Paroxysmal atrial fibrillation (HCC)  I48.0 amiodarone (PACERONE) 200 MG tablet  3. Orthostatic hypotension  I95.1   4. Supine hypertension  I10     Meds ordered this encounter  Medications  . furosemide (LASIX) 40 MG tablet    Sig: Take 1 tablet (40 mg total) by mouth as directed. Every 3 days and daily  if leg swells    Dispense:  30 tablet  . amiodarone (PACERONE) 200 MG tablet    Sig: Take 1 tablet (200 mg total) by mouth daily.    Dispense:  90 tablet    Refill:  1  . potassium chloride (KLOR-CON) 10 MEQ tablet    Sig: Take 1 tablet (10 mEq total) by mouth 2 (two) times daily as needed. With Furosemide only    Dispense:  60 tablet    Refill:  1    Medications Discontinued During This Encounter  Medication Reason  . meclizine (ANTIVERT) 25 MG tablet Patient Preference  . potassium chloride (KLOR-CON) 10 MEQ tablet   . amiodarone (PACERONE) 200 MG tablet   . furosemide (LASIX) 40 MG tablet     CHA2DS2-VASc Score is 5.  Yearly risk of stroke: 7.2% (A, F, HTN, CHF).  Score of 1=0.6; 2=2.2; 3=3.2; 4=4.8; 5=7.2; 6=9.8; 7=>9.8) -(CHF; HTN; vasc disease DM,  Female = 1; Age <65 =0; 65-74 = 1,  >75 =2; stroke/embolism= 2).   Recommendations:   Heidi Adams  is a 84  y.o. female  with bilateral hip replacement and knee replacement and left breast lumpectomy in 2018 without chemotherapy or radiation therapy for breast cancer and also remote hysterectomy due to uterine cancer in 1991, supine hypertension and orthostatic hypotension, paroxysmal atrial fibrillation on chronic Eliquis, chronic dizziness on standing. Has not been able to tolerate any BP medications due to severe dizzy spells.  Admitted to the hospital on 03/21/2020 and discharged on 03/27/2020 with E. coli urosepsis and bacteremia, on atrial fibrillation with RVR.  She underwent direct-current cardioversion in the emergency room and converted to  sinus rhythm with frequent PACs and brief atrial tachycardia.  With regard to cardiomyopathy and chronic systolic heart failure, she was initially started on Entresto and also for [place] but developed severe hypotension hence had to be discontinued.  She now presents for follow-up.  She is presently doing well, appears to be much more alert and is not dyspneic and has not had any further dizzy spells.  Although her blood pressure was elevated today, she has severe orthostatic hypotension, hence I did not make changes to her medications.  She is maintaining sinus rhythm with amiodarone, advised her to continue the same.  I said leg edema has completely resolved, she has discontinued taking furosemide.  She has severe LV dysfunction, probably related to underlying A. fib with RVR, advised her to switch taking furosemide to once every 3 days to reduce the risk of CHF exacerbation.  She will also take potassium supplements with furosemide.  I would like to see her back in 3 to 4 weeks for follow-up of atrial fibrillation and orthostatic hypotension.  This was a 50-minute encounter with reconciliation of the medications, discussions regarding heart failure and rhythm control.  Husband present and all questions answered.   Adrian Prows, MD, [facility] 04/04/2020, 10:23 AM Office: [phone]

## 2020-04-08 ENCOUNTER — Telehealth: Payer: Self-pay

## 2020-04-08 NOTE — Telephone Encounter (Signed)
done

## 2020-04-08 NOTE — Telephone Encounter (Signed)
Called pt to informed her about the message above, pt understood

## 2020-04-08 NOTE — Telephone Encounter (Signed)
Done

## 2020-04-08 NOTE — Telephone Encounter (Signed)
Take losartan at night and also Amiodarone at night

## 2020-04-08 NOTE — Telephone Encounter (Signed)
Pt called to inform us that she just got out of the hospital about a week ago and was given theses new medication.  Amiodarone 200mg , Losartan 25mg , Lasix 40mg , Metoprolol 25mg  Pt mention she is having Dizziness when lying down, and feels like she is going to pass out when standing. And thinks one of the new medications is too strong. Pt denies Headaches, SOB, CP. Please advise

## 2020-04-13 ENCOUNTER — Other Ambulatory Visit: Payer: Self-pay

## 2020-04-13 ENCOUNTER — Telehealth: Payer: Self-pay

## 2020-04-13 DIAGNOSIS — I4891 Unspecified atrial fibrillation: Secondary | ICD-10-CM | POA: Diagnosis not present

## 2020-04-13 DIAGNOSIS — I110 Hypertensive heart disease with heart failure: Secondary | ICD-10-CM | POA: Diagnosis not present

## 2020-04-13 MED ORDER — LOSARTAN POTASSIUM 25 MG PO TABS
25.0000 mg | ORAL_TABLET | Freq: Every day | ORAL | 2 refills | Status: DC
Start: 2020-04-13 — End: 2020-06-22

## 2020-04-13 MED ORDER — METOPROLOL SUCCINATE ER 25 MG PO TB24
25.0000 mg | ORAL_TABLET | Freq: Every day | ORAL | 2 refills | Status: DC
Start: 2020-04-13 — End: 2020-06-22

## 2020-04-13 NOTE — Telephone Encounter (Signed)
Pt called to inform us that she is taking Eliquis 5mg  in the morning, lasix 40mg  in the morning, levothyroxine 112mg  in the morning, Metoprolol 25mg  in the morning, potassium 10mg  in the morning, amiodarone 200mg  at bedtime, Eliquis 5mg  at bedtime, Losartan 25mg  at bedtime. Pt would like to know if that how she needs to take them.

## 2020-04-13 NOTE — Telephone Encounter (Signed)
This looks good. No change needed. JG

## 2020-04-13 NOTE — Telephone Encounter (Signed)
done

## 2020-04-14 NOTE — Telephone Encounter (Signed)
Called pt to inform her no changes needed

## 2020-04-20 ENCOUNTER — Ambulatory Visit: Payer: PPO | Admitting: Podiatry

## 2020-04-23 DIAGNOSIS — E039 Hypothyroidism, unspecified: Secondary | ICD-10-CM | POA: Diagnosis not present

## 2020-04-23 DIAGNOSIS — I5042 Chronic combined systolic (congestive) and diastolic (congestive) heart failure: Secondary | ICD-10-CM | POA: Diagnosis not present

## 2020-04-23 DIAGNOSIS — C50212 Malignant neoplasm of upper-inner quadrant of left female breast: Secondary | ICD-10-CM | POA: Diagnosis not present

## 2020-04-23 DIAGNOSIS — I110 Hypertensive heart disease with heart failure: Secondary | ICD-10-CM | POA: Diagnosis not present

## 2020-04-23 DIAGNOSIS — Z9049 Acquired absence of other specified parts of digestive tract: Secondary | ICD-10-CM | POA: Diagnosis not present

## 2020-04-23 DIAGNOSIS — I959 Hypotension, unspecified: Secondary | ICD-10-CM | POA: Diagnosis not present

## 2020-04-23 DIAGNOSIS — Z9181 History of falling: Secondary | ICD-10-CM | POA: Diagnosis not present

## 2020-04-23 DIAGNOSIS — Z85828 Personal history of other malignant neoplasm of skin: Secondary | ICD-10-CM | POA: Diagnosis not present

## 2020-04-23 DIAGNOSIS — Z9981 Dependence on supplemental oxygen: Secondary | ICD-10-CM | POA: Diagnosis not present

## 2020-04-23 DIAGNOSIS — M1990 Unspecified osteoarthritis, unspecified site: Secondary | ICD-10-CM | POA: Diagnosis not present

## 2020-04-23 DIAGNOSIS — I4891 Unspecified atrial fibrillation: Secondary | ICD-10-CM | POA: Diagnosis not present

## 2020-04-23 DIAGNOSIS — Z87891 Personal history of nicotine dependence: Secondary | ICD-10-CM | POA: Diagnosis not present

## 2020-04-23 DIAGNOSIS — Z7901 Long term (current) use of anticoagulants: Secondary | ICD-10-CM | POA: Diagnosis not present

## 2020-04-23 DIAGNOSIS — Z96653 Presence of artificial knee joint, bilateral: Secondary | ICD-10-CM | POA: Diagnosis not present

## 2020-04-23 DIAGNOSIS — Z96643 Presence of artificial hip joint, bilateral: Secondary | ICD-10-CM | POA: Diagnosis not present

## 2020-04-23 DIAGNOSIS — D630 Anemia in neoplastic disease: Secondary | ICD-10-CM | POA: Diagnosis not present

## 2020-04-24 ENCOUNTER — Telehealth: Payer: Self-pay

## 2020-04-24 DIAGNOSIS — Z9981 Dependence on supplemental oxygen: Secondary | ICD-10-CM | POA: Diagnosis not present

## 2020-04-24 DIAGNOSIS — Z96653 Presence of artificial knee joint, bilateral: Secondary | ICD-10-CM | POA: Diagnosis not present

## 2020-04-24 DIAGNOSIS — I110 Hypertensive heart disease with heart failure: Secondary | ICD-10-CM | POA: Diagnosis not present

## 2020-04-24 DIAGNOSIS — I5042 Chronic combined systolic (congestive) and diastolic (congestive) heart failure: Secondary | ICD-10-CM | POA: Diagnosis not present

## 2020-04-24 DIAGNOSIS — Z7901 Long term (current) use of anticoagulants: Secondary | ICD-10-CM | POA: Diagnosis not present

## 2020-04-24 DIAGNOSIS — M1990 Unspecified osteoarthritis, unspecified site: Secondary | ICD-10-CM | POA: Diagnosis not present

## 2020-04-24 DIAGNOSIS — Z9049 Acquired absence of other specified parts of digestive tract: Secondary | ICD-10-CM | POA: Diagnosis not present

## 2020-04-24 DIAGNOSIS — I4891 Unspecified atrial fibrillation: Secondary | ICD-10-CM | POA: Diagnosis not present

## 2020-04-24 DIAGNOSIS — Z87891 Personal history of nicotine dependence: Secondary | ICD-10-CM | POA: Diagnosis not present

## 2020-04-24 DIAGNOSIS — Z96643 Presence of artificial hip joint, bilateral: Secondary | ICD-10-CM | POA: Diagnosis not present

## 2020-04-24 DIAGNOSIS — Z9181 History of falling: Secondary | ICD-10-CM | POA: Diagnosis not present

## 2020-04-24 DIAGNOSIS — Z85828 Personal history of other malignant neoplasm of skin: Secondary | ICD-10-CM | POA: Diagnosis not present

## 2020-04-24 DIAGNOSIS — I959 Hypotension, unspecified: Secondary | ICD-10-CM | POA: Diagnosis not present

## 2020-04-24 DIAGNOSIS — D630 Anemia in neoplastic disease: Secondary | ICD-10-CM | POA: Diagnosis not present

## 2020-04-24 DIAGNOSIS — E039 Hypothyroidism, unspecified: Secondary | ICD-10-CM | POA: Diagnosis not present

## 2020-04-24 DIAGNOSIS — C50212 Malignant neoplasm of upper-inner quadrant of left female breast: Secondary | ICD-10-CM | POA: Diagnosis not present

## 2020-04-24 NOTE — Telephone Encounter (Signed)
Pt called asking if she can stop all her bp medication. Pt mention her needs a refill on all her medication, but would rather not take any please advise

## 2020-04-24 NOTE — Telephone Encounter (Signed)
If she does that, then she will have uncontrolled BP and feel bad also. So no. JG

## 2020-04-27 ENCOUNTER — Ambulatory Visit: Payer: PPO | Admitting: Podiatry

## 2020-04-27 DIAGNOSIS — I110 Hypertensive heart disease with heart failure: Secondary | ICD-10-CM | POA: Diagnosis not present

## 2020-04-27 DIAGNOSIS — I480 Paroxysmal atrial fibrillation: Secondary | ICD-10-CM | POA: Diagnosis not present

## 2020-04-27 DIAGNOSIS — I10 Essential (primary) hypertension: Secondary | ICD-10-CM | POA: Diagnosis not present

## 2020-04-27 DIAGNOSIS — I5032 Chronic diastolic (congestive) heart failure: Secondary | ICD-10-CM | POA: Diagnosis not present

## 2020-04-27 DIAGNOSIS — I4891 Unspecified atrial fibrillation: Secondary | ICD-10-CM | POA: Diagnosis not present

## 2020-04-27 NOTE — Telephone Encounter (Signed)
Called pt to inform her about the message above. Pt understood.

## 2020-04-28 DIAGNOSIS — N390 Urinary tract infection, site not specified: Secondary | ICD-10-CM | POA: Diagnosis not present

## 2020-04-29 DIAGNOSIS — I110 Hypertensive heart disease with heart failure: Secondary | ICD-10-CM | POA: Diagnosis not present

## 2020-04-29 DIAGNOSIS — C50212 Malignant neoplasm of upper-inner quadrant of left female breast: Secondary | ICD-10-CM | POA: Diagnosis not present

## 2020-04-29 DIAGNOSIS — Z9981 Dependence on supplemental oxygen: Secondary | ICD-10-CM | POA: Diagnosis not present

## 2020-04-29 DIAGNOSIS — M1990 Unspecified osteoarthritis, unspecified site: Secondary | ICD-10-CM | POA: Diagnosis not present

## 2020-04-29 DIAGNOSIS — Z7901 Long term (current) use of anticoagulants: Secondary | ICD-10-CM | POA: Diagnosis not present

## 2020-04-29 DIAGNOSIS — Z9049 Acquired absence of other specified parts of digestive tract: Secondary | ICD-10-CM | POA: Diagnosis not present

## 2020-04-29 DIAGNOSIS — I5042 Chronic combined systolic (congestive) and diastolic (congestive) heart failure: Secondary | ICD-10-CM | POA: Diagnosis not present

## 2020-04-29 DIAGNOSIS — Z87891 Personal history of nicotine dependence: Secondary | ICD-10-CM | POA: Diagnosis not present

## 2020-04-29 DIAGNOSIS — I4891 Unspecified atrial fibrillation: Secondary | ICD-10-CM | POA: Diagnosis not present

## 2020-04-29 DIAGNOSIS — Z96653 Presence of artificial knee joint, bilateral: Secondary | ICD-10-CM | POA: Diagnosis not present

## 2020-04-29 DIAGNOSIS — Z85828 Personal history of other malignant neoplasm of skin: Secondary | ICD-10-CM | POA: Diagnosis not present

## 2020-04-29 DIAGNOSIS — E039 Hypothyroidism, unspecified: Secondary | ICD-10-CM | POA: Diagnosis not present

## 2020-04-29 DIAGNOSIS — D630 Anemia in neoplastic disease: Secondary | ICD-10-CM | POA: Diagnosis not present

## 2020-04-29 DIAGNOSIS — Z9181 History of falling: Secondary | ICD-10-CM | POA: Diagnosis not present

## 2020-04-29 DIAGNOSIS — Z96643 Presence of artificial hip joint, bilateral: Secondary | ICD-10-CM | POA: Diagnosis not present

## 2020-04-29 DIAGNOSIS — I959 Hypotension, unspecified: Secondary | ICD-10-CM | POA: Diagnosis not present

## 2020-04-30 ENCOUNTER — Ambulatory Visit: Payer: PPO | Admitting: Cardiology

## 2020-04-30 ENCOUNTER — Other Ambulatory Visit: Payer: Self-pay

## 2020-04-30 ENCOUNTER — Encounter: Payer: Self-pay | Admitting: Cardiology

## 2020-04-30 VITALS — BP 144/65 | HR 75 | Resp 18 | Ht 64.0 in | Wt 129.7 lb

## 2020-04-30 DIAGNOSIS — I480 Paroxysmal atrial fibrillation: Secondary | ICD-10-CM

## 2020-04-30 DIAGNOSIS — I5022 Chronic systolic (congestive) heart failure: Secondary | ICD-10-CM

## 2020-04-30 DIAGNOSIS — I428 Other cardiomyopathies: Secondary | ICD-10-CM

## 2020-04-30 DIAGNOSIS — I10 Essential (primary) hypertension: Secondary | ICD-10-CM

## 2020-04-30 DIAGNOSIS — I951 Orthostatic hypotension: Secondary | ICD-10-CM

## 2020-04-30 NOTE — Progress Notes (Signed)
Primary Physician/Referring:  [name], MD  Patient ID: Heidi Adams, female    DOB: 1936-07-04, 84 y.o.   MRN: [id number]  Chief Complaint  Patient presents with  . Congestive Heart Failure  . Atrial Fibrillation  . Follow-up    3 weeks   HPI:    Heidi Adams  is a 84 y.o. female with bilateral hip replacement and knee replacement and left breast lumpectomy in 2018 without chemotherapy or radiation therapy for breast cancer and also remote hysterectomy due to uterine cancer in 1991, supine hypertension and severe orthostatic hypotension. Has not been able to tolerate any BP medications due to severe dizzy spells.  Admitted to the hospital on 03/21/2020 with urosepsis and atrial fibrillation with RVR, underwent direct current cardioversion, and discharged 03/27/2020.  Patient presents today for 4-week follow-up.  She states that she has had recurrence of urinary tract infection and was started on Macrobid on 04/27/2020.  She also expresses concern she is feeling fatigued due to current medication regimen.  She continues to receive home health and physical therapy.  In regard to chronic systolic heart failure she was unable to tolerate Entresto and Farxiga due to severe hypotension.  Presently she denies chest pain, orthopnea, PND, syncope.  She does continue to have dyspnea and leg swelling.  She is presently taking furosemide 40 mg as needed for swelling or shortness of breath.  Past Medical History:  Diagnosis Date  . Allergy   . Anemia    hx of anemia  . Arthritis   . Breast cancer ([place])    Left breast  . Cancer ([place])    skin ca nose  . Chronic combined systolic and diastolic CHF (congestive heart failure) ([place]) 09/22/2018  . Chronic combined systolic and diastolic CHF (congestive heart failure) ([place]) 09/22/2018  . Chronic combined systolic and diastolic CHF (congestive heart failure) ([place]) 09/22/2018  . Dysrhythmia    went into Atrial Fibrillation after last surgery, HAS  PALPITATIONS  . History of skin cancer   . Hypertension   . Hypothyroidism   . PVC (premature ventricular contraction)   . Thyroiditis 1973   Past Surgical History:  Procedure Laterality Date  . ABDOMINAL HYSTERECTOMY  90   TAH BSO  . APPENDECTOMY    . BREAST LUMPECTOMY Left 05/30/2017  . BREAST LUMPECTOMY WITH RADIOACTIVE SEED LOCALIZATION Left 05/30/2017   Procedure: LEFT BREAST LUMPECTOMY WITH RADIOACTIVE SEED LOCALIZATION;  Surgeon: Excell Seltzer, MD;  Location: [facility];  Service: General;  Laterality: Left;  . CATARACT EXTRACTION  09/10/2018   Right eye  . CHOLECYSTECTOMY     dr Georgia Lopes  . ELBOW SURGERY    . EYE SURGERY     "on my eyelids" years ago  . KNEE ARTHROSCOPY     left  . THYROIDECTOMY  1973  . TONSILLECTOMY    . TOTAL HIP ARTHROPLASTY Right 05/10/2016   Procedure: TOTAL HIP ARTHROPLASTY ANTERIOR APPROACH;  Surgeon: Paralee Cancel, MD;  Location: [facility];  Service: Orthopedics;  Laterality: Right;  . TOTAL HIP ARTHROPLASTY Left 01/10/2017   Procedure: LEFT TOTAL HIP ARTHROPLASTY ANTERIOR APPROACH;  Surgeon: Paralee Cancel, MD;  Location: [facility];  Service: Orthopedics;  Laterality: Left;  . TOTAL KNEE ARTHROPLASTY Left 09/28/2015   Procedure: LEFT TOTAL KNEE ARTHROPLASTY;  Surgeon: Paralee Cancel, MD;  Location: [facility];  Service: Orthopedics;  Laterality: Left;  . TOTAL KNEE ARTHROPLASTY Right 08/09/2016   Procedure: RIGHT TOTAL KNEE ARTHROPLASTY;  Surgeon: Paralee Cancel, MD;  Location: [facility];  Service: Orthopedics;  Laterality: Right;  Adductor Block  . Total Left hip arthroplasty     01/10/17 Dr. Alvan Dame   Family History  Problem Relation Age of Onset  . Diabetes Father   . Hypertension Father   . Stroke Father   . Breast cancer Cousin   . Pneumonia Sister     Social History   Tobacco Use  . Smoking status: Former Smoker    Packs/day: 1.00    Years: 20.00    Pack years: 20.00    Types: Cigarettes    Quit date: 07/26/1987    Years since  quitting: 32.7  . Smokeless tobacco: Never Used  Substance Use Topics  . Alcohol use: No    Alcohol/week: 0.0 standard drinks   Marital Status: Married   ROS  Review of Systems  Constitutional: Positive for malaise/fatigue. Negative for diaphoresis and weight gain.  Cardiovascular: Positive for dyspnea on exertion and leg swelling (intermittent, improved with support stockings). Negative for chest pain, orthopnea, palpitations and syncope.  Respiratory: Positive for shortness of breath. Negative for cough.   Musculoskeletal: Positive for arthritis and joint pain. Negative for joint swelling.  Gastrointestinal: Negative for nausea and vomiting.  Neurological: Positive for light-headedness.   Objective  Blood pressure (!) 144/65, pulse 75, resp. rate 18, height 5\' 4"  (1.626 m), weight 129 lb 11.2 oz (58.8 kg), last menstrual period 07/25/1988, SpO2 94 %.  Vitals with BMI 04/30/2020 04/03/2020 03/27/2020  Height 5\' 4"  5\' 4"  -  Weight 129 lbs 11 oz 133 lbs -  BMI 47.82 95.62 -  Systolic 130 865 784  Diastolic 65 84 76  Pulse 75 66 -    Orthostatic VS for the past 72 hrs (Last 3 readings):  Orthostatic BP Patient Position BP Location Cuff Size Orthostatic Pulse  04/30/20 1137 133/59 Standing Left Arm Normal 74  04/30/20 1134 161/73 Sitting Left Arm Normal 75  04/30/20 1133 188/80 Supine Left Arm Normal 75     Physical Exam Constitutional:      General: She is not in acute distress.    Comments: Moderately build, frail.   Cardiovascular:     Rate and Rhythm: Normal rate and regular rhythm.     Pulses: Intact distal pulses.          Radial pulses are 2+ on the right side and 2+ on the left side.       Dorsalis pedis pulses are 1+ on the right side and 1+ on the left side.       Posterior tibial pulses are 1+ on the right side and 1+ on the left side.     Heart sounds: S1 normal and S2 normal. No murmur heard.  No gallop.      Comments: No JVD. 2+ pitting edema bilateral legs below  the knee.  Pulmonary:     Effort: No accessory muscle usage or respiratory distress.     Breath sounds: Examination of the right-lower field reveals rhonchi. Examination of the left-lower field reveals rhonchi. Rhonchi (bibasilar coarse crackles) present.    Laboratory examination:   Recent Labs    03/25/20 0452 03/26/20 0413 03/27/20 1458  NA 137 139 139  K 3.7 3.7 3.7  CL 101 103 104  CO2 26 26 26   GLUCOSE 112* 93 100*  BUN 30* 25* 17  CREATININE 1.25* 1.25* 0.99  CALCIUM 8.2* 8.0* 7.9*  GFRNONAA 40* 40* 53*  GFRAA 47* 47* >60   CrCl cannot be  calculated (Patient's most recent lab result is older than the maximum 21 days allowed.).  CMP Latest Ref Rng & Units 03/27/2020 03/26/2020 03/25/2020  Glucose 70 - 99 mg/dL 100(H) 93 112(H)  BUN 8 - 23 mg/dL 17 25(H) 30(H)  Creatinine 0.44 - 1.00 mg/dL 0.99 1.25(H) 1.25(H)  Sodium 135 - 145 mmol/L 139 139 137  Potassium 3.5 - 5.1 mmol/L 3.7 3.7 3.7  Chloride 98 - 111 mmol/L 104 103 101  CO2 22 - 32 mmol/L 26 26 26   Calcium 8.9 - 10.3 mg/dL 7.9(L) 8.0(L) 8.2(L)  Total Protein 6.5 - 8.1 g/dL - - -  Total Bilirubin 0.3 - 1.2 mg/dL - - -  Alkaline Phos 38 - 126 U/L - - -  AST 15 - 41 U/L - - -  ALT 0 - 44 U/L - - -   CBC Latest Ref Rng & Units 03/27/2020 03/26/2020 03/25/2020  WBC 4.0 - 10.5 K/uL 14.3(H) 14.0(H) 14.5(H)  Hemoglobin 12.0 - 15.0 g/dL 9.0(L) 10.0(L) 9.9(L)  Hematocrit 36 - 46 % 31.4(L) 33.5(L) 34.1(L)  Platelets 150 - 400 K/uL 449(H) 546(H) 505(H)   Lipid Panel     Component Value Date/Time   CHOL 142 07/31/2018 0655   TRIG 72 07/31/2018 0655   HDL 45 07/31/2018 0655   CHOLHDL 3.2 07/31/2018 0655   VLDL 14 07/31/2018 0655   LDLCALC 83 07/31/2018 0655   HEMOGLOBIN A1C Lab Results  Component Value Date   HGBA1C 5.0 07/31/2018   MPG 96.8 07/31/2018   TSH Recent Labs    03/21/20 2329  TSH 5.095*   BNP (last 3 results) Recent Labs    03/20/20 1043 03/21/20 2116  BNP 874.3* 1,091.1*    External labs:    Lipid Panel completed 04/09/2019 HDL 38 MG/DL 04/09/2019 LDL 80.000 mg 04/09/2019 Cholesterol, total 143.000 m 04/09/2019 Triglycerides 127.000 04/09/2019  A1C 5.000 07/31/2018  Glucose Random 110.000 m 11/04/2019 MicroAlbumin Urine 9.000 04/25/2019 MicroAlbumin/Creat 16.8 MG/ 04/25/2019 BUN 18.000 mg 11/04/2019 Creatinine, Serum 0.700 mg/ 11/04/2019  TSH 2.620 micr 11/04/2019  Medications and allergies   Allergies  Allergen Reactions  . Codeine Nausea Only  . Epinephrine     Tachcardyia, chest pains tremors  . Tenormin [Atenolol] Rash    Outpatient Medications Prior to Visit  Medication Sig Dispense Refill  . acetaminophen (TYLENOL) 500 MG tablet Take 1,000 mg by mouth 3 (three) times daily as needed for moderate pain.     Marland Kitchen amiodarone (PACERONE) 200 MG tablet Take 1 tablet (200 mg total) by mouth daily. 90 tablet 1  . apixaban (ELIQUIS) 5 MG TABS tablet Take 1 tablet (5 mg total) by mouth 2 (two) times daily. 180 tablet 3  . furosemide (LASIX) 40 MG tablet Take 1 tablet (40 mg total) by mouth as directed. Every 3 days and daily  if leg swells 30 tablet   . levothyroxine (SYNTHROID, LEVOTHROID) 112 MCG tablet Take 112-168 mcg by mouth daily before breakfast. Take 1 tablet (112 mcg) on Mondays, Tuesdays, Wednesdays, Thursdays, Fridays & Saturdays. Take 1.5 tablet (168 mcg) on Sundays    . losartan (COZAAR) 25 MG tablet Take 1 tablet (25 mg total) by mouth at bedtime. 30 tablet 2  . metoprolol succinate (TOPROL-XL) 25 MG 24 hr tablet Take 1 tablet (25 mg total) by mouth daily. 30 tablet 2  . pantoprazole (PROTONIX) 40 MG tablet Take 40 mg by mouth daily as needed (indigestion/heartburn.).     Marland Kitchen potassium chloride (KLOR-CON) 10 MEQ tablet Take 1 tablet (10  mEq total) by mouth 2 (two) times daily as needed. With Furosemide only 60 tablet 1  . nitrofurantoin, macrocrystal-monohydrate, (MACROBID) 100 MG capsule Take 100 mg by mouth 2 (two) times daily. (Patient not taking: Reported on  04/30/2020)     No facility-administered medications prior to visit.    Radiology:   No results found.  Cardiac Studies:   Lexiscan Myoview stress test  04/23/2018: 1. The resting electrocardiogram demonstrated normal sinus rhythm, normal resting conduction and no resting arrhythmias.  Stress EKG is non-diagnostic for ischemia as it a pharmacologic stress using Lexiscan. Occasional PAC and PVC. The patient developed significant symptoms which included Chest Pain, Stomach pain, Headache.  2. The overall quality of the study is good. There is no evidence of abnormal lung activity. Stress and rest SPECT images demonstrate homogeneous tracer distribution throughout the myocardium. Gated SPECT imaging reveals diffuse global hypokinesis. The left ventricular ejection fraction was markedly depressed at (26%).   3. This is a high risk study due to severe LV systolic dysfunction. Findings may represent non ischemic cardiomyopathy.  Echocardiogram 03/10/2020:  Left ventricle cavity is normal in size. Moderate concentric hypertrophy of the left ventricle. Severe global hypokinesis. LVEF 15-20%. Doppler evidence of grade II (pseudonormal) diastolic dysfunction, elevated LAP.  The aortic root is mildly dilated 3.9 cm.  Left atrial cavity is severely dilated at 65 cc/m2.  Trileaflet aortic valve. Moderate (Grade II) aortic regurgitation.  Moderate (Grade III) mitral regurgitation.  Mild tricuspid regurgitation. Estimated pulmonary artery systolic pressure 38 mmHg.  Mild pulmonic regurgitation.  Previous study on 07/30/2018, EF 40% (reported normal)    EKG EKG 04/30/2020: Normal sinus rhythm at the rate of 75 bpm, left atrial enlargement, left axis deviation, left intrafascicular block.  Poor R wave progression, cannot exclude anteroseptal infarct old.  IVCD, LVH.  Nonspecific ST-T abnormality.  Consider digitalis effect versus LVH with strain. Compared to EKG 09/24/2019, no significant change in later ST  abnormalities.   EKG 03/25/2020: Atrial fibrillation with rapid ventricular response at the rate of 140 bpm, left axis deviation, left intrafascicular block.  Poor R wave progression, cannot exclude anteroseptal infarct old.  Nonspecific ST-T abnormality, cannot exclude lateral ischemia.  EKG 01/10/2020: Normal sinus rhythm with rate of 95 bpm, left axis deviation, left anterior fascicular block.  Poor R wave progression, cannot exclude anteroseptal infarct old.  No evidence of ischemia. No significant change from EKG 11/29/2019   Assessment     ICD-10-CM   1. Chronic systolic heart failure (HCC)  I50.22 PCV ECHOCARDIOGRAM COMPLETE  2. Non-ischemic cardiomyopathy ([place])  I42.8   3. Paroxysmal atrial fibrillation (HCC)  I48.0 EKG 12-Lead    PCV ECHOCARDIOGRAM COMPLETE  4. Orthostatic hypotension  I95.1   5. Supine hypertension  I10     No orders of the defined types were placed in this encounter.   There are no discontinued medications.  CHA2DS2-VASc Score is 5.  Yearly risk of stroke: 7.2% (A, F, HTN, CHF).  Score of 1=0.6; 2=2.2; 3=3.2; 4=4.8; 5=7.2; 6=9.8; 7=>9.8) -(CHF; HTN; vasc disease DM,  Female = 1; Age <65 =0; 65-74 = 1,  >75 =2; stroke/embolism= 2).   Recommendations:   Heidi Adams  is a 84 y.o. female with bilateral hip replacement and knee replacement and left breast lumpectomy in 2018 without chemotherapy or radiation therapy for breast cancer and also remote hysterectomy due to uterine cancer in 1991, supine hypertension and severe orthostatic hypotension. Has not been able to tolerate any BP medications due  to severe dizzy spells.  Admitted to the hospital on 03/21/2020 with urosepsis and atrial fibrillation with RVR, underwent direct current cardioversion, and discharged 03/27/2020.  Patient presents for 4-week follow-up currently maintaining sinus rhythm with amiodarone 200 mg daily, will continue.  She is tolerating Eliquis well without bleeding diathesis, will continue 5 mg  twice daily as CHA2DS2-VASc score is 5.  Patient continues to have supine hypertension with marked orthostatic hypotension, in light of this I am pleased with current blood pressure control.  Patient has severe left ventricular dysfunction likely due to underlying atrial fibrillation with RVR, and hopefully this will improve with guideline directed medical therapy.  Will continue losartan 25 mg daily, metoprolol succinate 25 mg daily, and furosemide 20-40 mg as needed for volume overload with supplemental potassium as well.  Will repeat echocardiogram in 3 months to evaluate left ventricular function.  In regard to patient's complaints of fatigue due to medications, discussed with her that benefits of current medication regimen and improvement of clinical symptoms and picture warrant continuing current medications.  Discussed with her the use of support stockings to improve orthostatic hypotension as well as conservative measures to reduce dizziness associated with position changes.  Patient diagnosed 04/27/2020 with urinary tract infection, currently taking Macrobid for this.  Will defer further management to primary care provider.  Follow-up in 3 months for heart failure after repeat echocardiogram.  Patient was seen in collaboration with Dr. [name]. He also reviewed patient's chart and examined the patient. Dr. [name] is in agreement of the plan.    Heidi Berthold, PA-C 04/30/2020, 2:10 PM Office: [phone]

## 2020-05-15 DIAGNOSIS — D649 Anemia, unspecified: Secondary | ICD-10-CM | POA: Diagnosis not present

## 2020-05-15 DIAGNOSIS — K635 Polyp of colon: Secondary | ICD-10-CM | POA: Diagnosis not present

## 2020-05-15 DIAGNOSIS — Z7901 Long term (current) use of anticoagulants: Secondary | ICD-10-CM | POA: Diagnosis not present

## 2020-05-15 DIAGNOSIS — I480 Paroxysmal atrial fibrillation: Secondary | ICD-10-CM | POA: Diagnosis not present

## 2020-05-15 DIAGNOSIS — R1904 Left lower quadrant abdominal swelling, mass and lump: Secondary | ICD-10-CM | POA: Diagnosis not present

## 2020-05-15 DIAGNOSIS — E785 Hyperlipidemia, unspecified: Secondary | ICD-10-CM | POA: Diagnosis not present

## 2020-05-15 DIAGNOSIS — R634 Abnormal weight loss: Secondary | ICD-10-CM | POA: Diagnosis not present

## 2020-05-15 DIAGNOSIS — C50919 Malignant neoplasm of unspecified site of unspecified female breast: Secondary | ICD-10-CM | POA: Diagnosis not present

## 2020-05-15 DIAGNOSIS — Z7689 Persons encountering health services in other specified circumstances: Secondary | ICD-10-CM | POA: Diagnosis not present

## 2020-05-15 DIAGNOSIS — Z23 Encounter for immunization: Secondary | ICD-10-CM | POA: Diagnosis not present

## 2020-05-19 ENCOUNTER — Other Ambulatory Visit (HOSPITAL_BASED_OUTPATIENT_CLINIC_OR_DEPARTMENT_OTHER): Payer: Self-pay | Admitting: Internal Medicine

## 2020-05-19 ENCOUNTER — Inpatient Hospital Stay (HOSPITAL_BASED_OUTPATIENT_CLINIC_OR_DEPARTMENT_OTHER): Admission: RE | Admit: 2020-05-19 | Payer: PPO | Source: Ambulatory Visit

## 2020-05-19 ENCOUNTER — Encounter (HOSPITAL_BASED_OUTPATIENT_CLINIC_OR_DEPARTMENT_OTHER): Payer: Self-pay

## 2020-05-19 ENCOUNTER — Ambulatory Visit (HOSPITAL_BASED_OUTPATIENT_CLINIC_OR_DEPARTMENT_OTHER)
Admission: RE | Admit: 2020-05-19 | Discharge: 2020-05-19 | Disposition: A | Discharge status: Home / Self Care | Payer: PPO | Source: Ambulatory Visit | Attending: Internal Medicine | Admitting: Internal Medicine

## 2020-05-19 ENCOUNTER — Other Ambulatory Visit: Payer: Self-pay

## 2020-05-19 DIAGNOSIS — R1032 Left lower quadrant pain: Secondary | ICD-10-CM

## 2020-05-19 DIAGNOSIS — R1904 Left lower quadrant abdominal swelling, mass and lump: Secondary | ICD-10-CM

## 2020-05-19 DIAGNOSIS — E785 Hyperlipidemia, unspecified: Secondary | ICD-10-CM | POA: Diagnosis not present

## 2020-05-19 DIAGNOSIS — K760 Fatty (change of) liver, not elsewhere classified: Secondary | ICD-10-CM | POA: Diagnosis not present

## 2020-05-19 IMAGING — CT CT ABD-PELV W/ CM
2 of 5 series · 14 of 46 positions shown, 16 images · IV contrast (Omnipaque)
Comparison: September 05, 2016.

CLINICAL DATA: Reported soft tissue prominence along left lower
abdominal wall

EXAM:
CT ABDOMEN AND PELVIS WITH CONTRAST
TECHNIQUE: Multidetector CT imaging of the abdomen and pelvis was performed
using the standard protocol following bolus administration of
intravenous contrast. Oral contrast was also administered. Marker
was placed at the site of patient concern along the left lower
quadrant abdominal wall.
CONTRAST:  100mL OMNIPAQUE IOHEXOL 300 MG/ML  SOLN

[Series 3: axial st · axial · 0.80mm/px · z∈[-661,-226]mm · 11 of 99 slices shown, 13 images]
[im 6/99  soft-tissue]
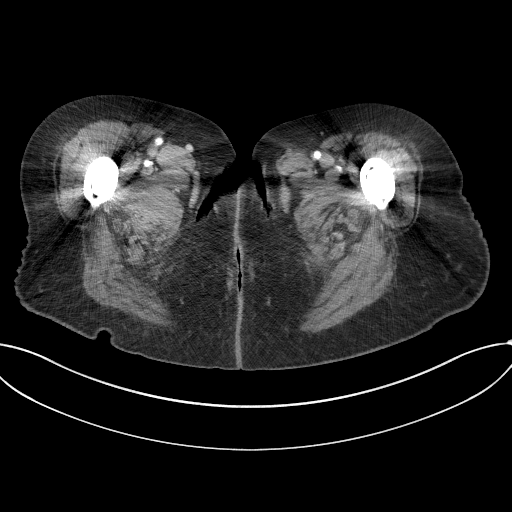
[im 6/99  bone]
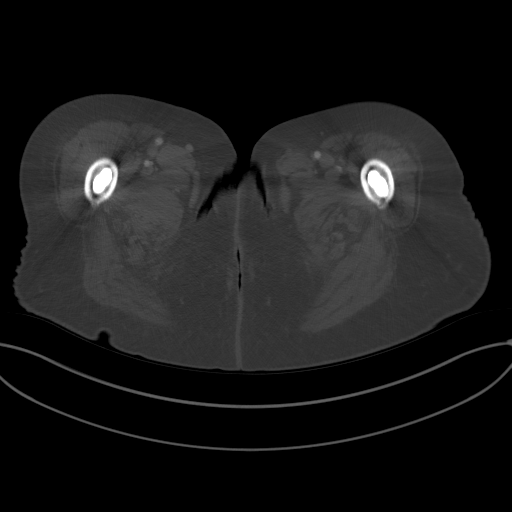
[im 16/99  soft-tissue]
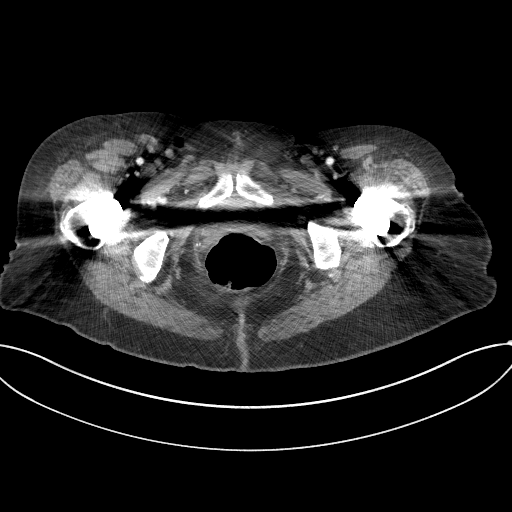
[im 26/99  soft-tissue]
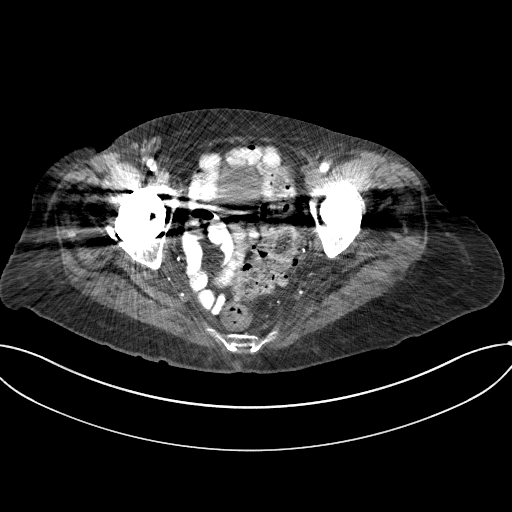
[im 31/99  soft-tissue]
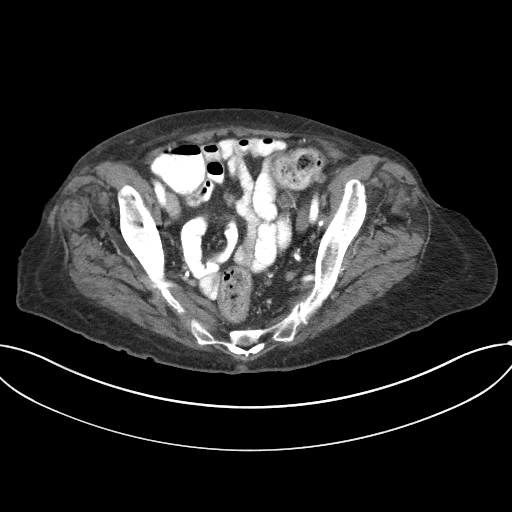
[im 42/99  soft-tissue]
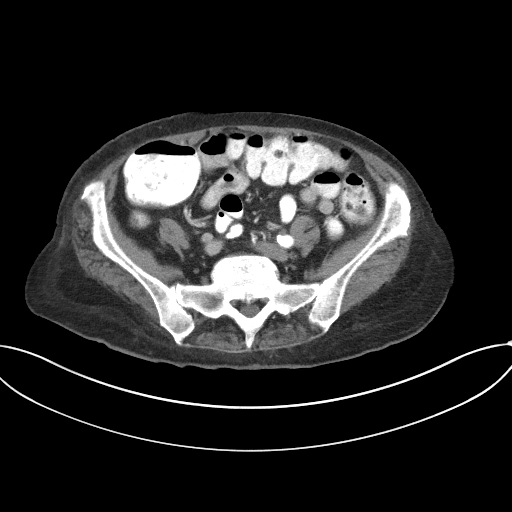
[im 52/99  soft-tissue]
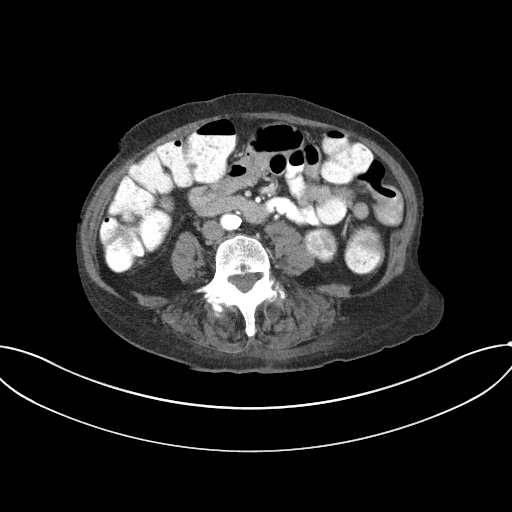
[im 57/99  soft-tissue]
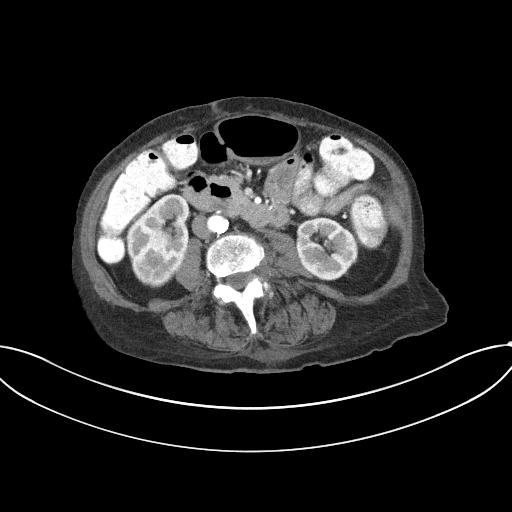
[im 68/99  soft-tissue]
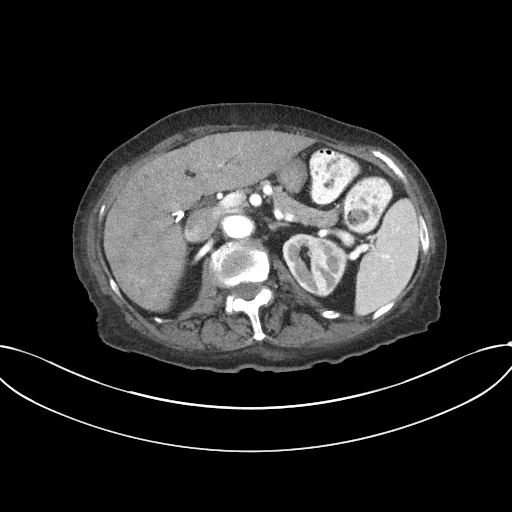
[im 73/99  soft-tissue]
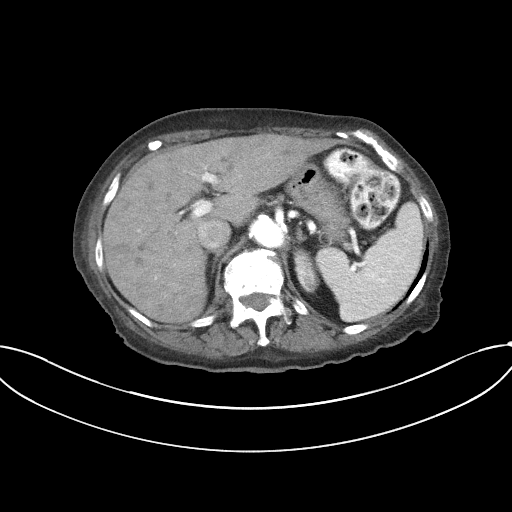
[im 73/99  bone]
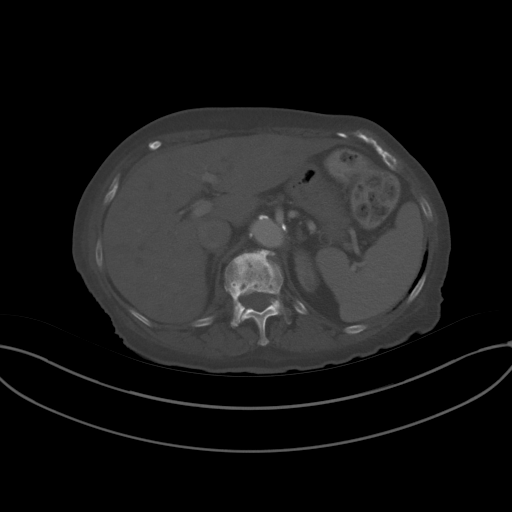
[im 83/99  soft-tissue]
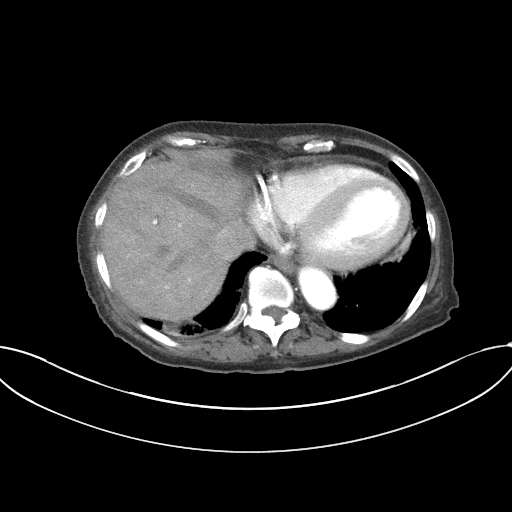
[im 93/99  soft-tissue]
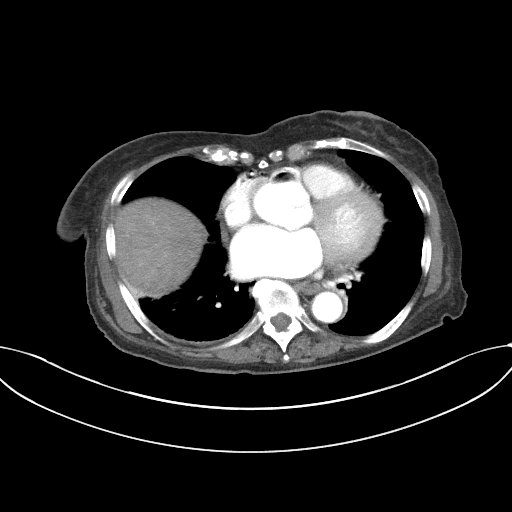

[Series 6: coronal st · coronal · 0.82mm/px · 3 of 76 slices shown]
[im 26/76  soft-tissue]
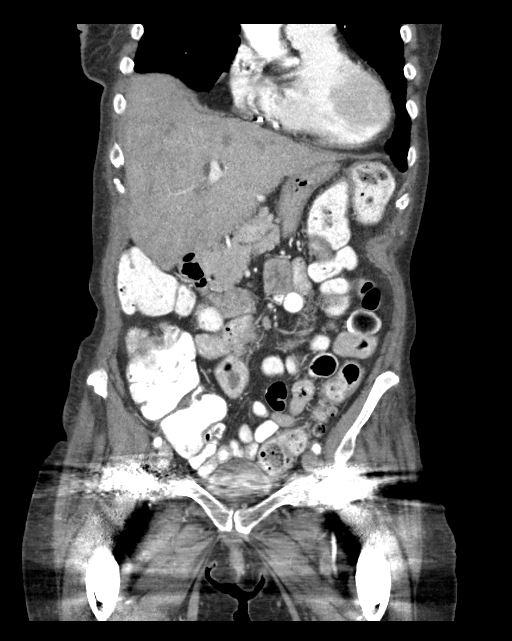
[im 34/76  soft-tissue]
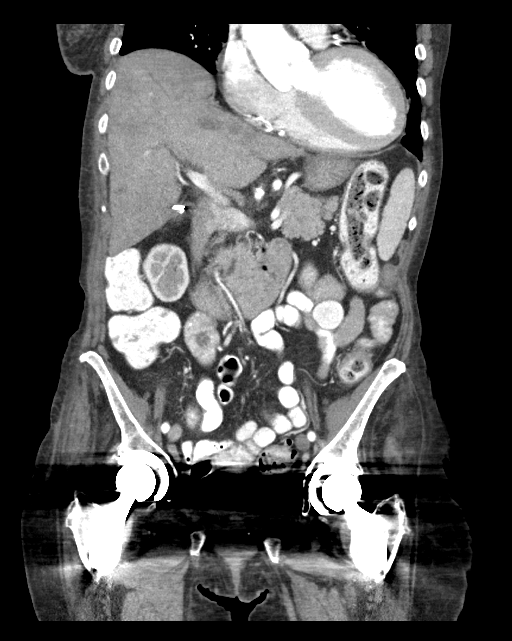
[im 42/76  soft-tissue]
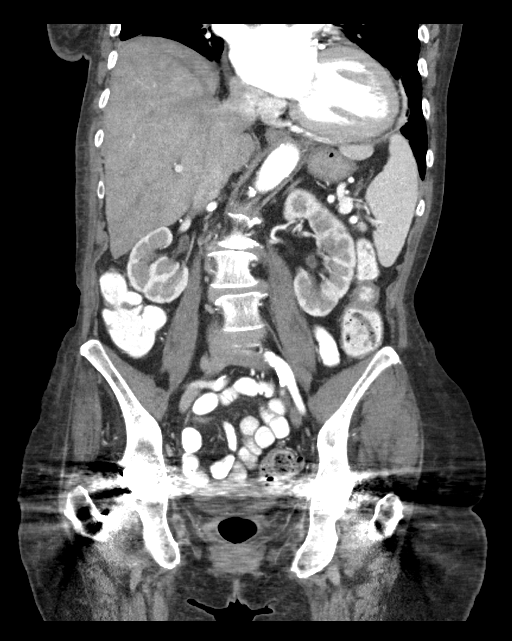

[14 of 46 positions shown; findings below may reference images not displayed]

FINDINGS: Lower chest: Areas of bibasilar scarring and atelectasis noted. No
lung base edema or airspace opacity. There are scattered foci of
coronary artery calcification.

Hepatobiliary: There is a degree of hepatic steatosis. No focal
liver lesions are appreciable. Gallbladder is absent. Mild
intrahepatic biliary duct dilatation is likely secondary to post
cholecystectomy state. Proximal common bile duct measures 11 mm with
tapering 8 mm distally. No biliary duct mass or calculus is
appreciable by CT.

Pancreas: No appreciable pancreatic mass or inflammatory focus.
Borderline prominence of the pancreatic duct is stable compared to
the prior study.

Spleen: No splenic lesions are evident.

Adrenals/Urinary Tract: Adrenals bilaterally appear unremarkable.
There is a cyst in the medial mid left kidney measuring 1.0 x
cm. There is mild scarring in the left kidney. There is no evident
hydronephrosis on either side. There is no appreciable renal or
ureteral calculus on either side. Urinary bladder is midline with
wall thickness within normal limits.

Stomach/Bowel: There are multiple sigmoid diverticula without
diverticulitis. There is moderate stool throughout the colon. There
is no demonstrable bowel obstruction. The terminal ileum appears
unremarkable. There is no evident free air or portal venous air.

Vascular/Lymphatic: There is no abdominal aortic aneurysm. Aorta is
tortuous with atherosclerotic calcification in the aorta and iliac
arteries. Major venous structures are patent. There is no
appreciable adenopathy in the abdomen or pelvis.

Reproductive: Uterus apparently absent. No pelvic/adnexal mass
evident.

Other: Appendix absent. No periappendiceal region inflammation. No
abscess or ascites evident in the abdomen or pelvis.

There is no lesion evident along the anterior abdominal wall on the
left at the site marked. No mass is seen in the peritoneum at this
level.

Musculoskeletal: There is multifocal degenerative change throughout
the thoracic and lumbar regions. There is spinal stenosis at L3-4
and L4-5 due to diffuse disc protrusion and bony hypertrophy at
these levels. There are no blastic or lytic bone lesions. Total hip
replacements are noted bilaterally. No intramuscular lesions are
appreciable. Note relative atrophy of several left upper thigh
muscles.
IMPRESSION: 1. No lesion is seen by CT at the site of concern along the left
lower quadrant abdominal wall, marked with small metallic marker on
this current study.

2. Hepatic steatosis. Gallbladder absent with a degree of biliary
duct dilatation, also present previously. No obstructing focus seen
by CT in the biliary ductal system.

3. Sigmoid diverticula without diverticulitis. No bowel obstruction.
No abscess in the abdomen or pelvis. Appendix absent. No
periappendiceal region inflammation.

4. Stable borderline prominence of the pancreatic duct without focal
pancreatic lesions seen.

5. There is spinal stenosis at L3-4 and L4-5 due to disc protrusion
and bony hypertrophy.

6. Aortic Atherosclerosis ([id number].J). Foci of pelvic arterial and
coronary artery calcification noted.

7.  Total hip replacements bilaterally.

8. No hydronephrosis on either side. No evident renal or ureteral
calculus on either side. Urinary bladder wall thickness within
normal limits.

## 2020-05-19 MED ORDER — IOHEXOL 300 MG/ML  SOLN
100.0000 mL | Freq: Once | INTRAMUSCULAR | Status: AC | PRN
  Administered 2020-05-19: 100 mL via INTRAVENOUS

## 2020-05-25 DIAGNOSIS — I4891 Unspecified atrial fibrillation: Secondary | ICD-10-CM | POA: Diagnosis not present

## 2020-05-25 DIAGNOSIS — K921 Melena: Secondary | ICD-10-CM | POA: Diagnosis not present

## 2020-05-25 DIAGNOSIS — I110 Hypertensive heart disease with heart failure: Secondary | ICD-10-CM | POA: Diagnosis not present

## 2020-05-28 DIAGNOSIS — I480 Paroxysmal atrial fibrillation: Secondary | ICD-10-CM | POA: Diagnosis not present

## 2020-05-28 DIAGNOSIS — I10 Essential (primary) hypertension: Secondary | ICD-10-CM | POA: Diagnosis not present

## 2020-05-28 DIAGNOSIS — I5032 Chronic diastolic (congestive) heart failure: Secondary | ICD-10-CM | POA: Diagnosis not present

## 2020-06-03 ENCOUNTER — Ambulatory Visit: Payer: PPO | Admitting: Cardiology

## 2020-06-03 DIAGNOSIS — R1310 Dysphagia, unspecified: Secondary | ICD-10-CM | POA: Diagnosis not present

## 2020-06-03 DIAGNOSIS — D500 Iron deficiency anemia secondary to blood loss (chronic): Secondary | ICD-10-CM | POA: Diagnosis not present

## 2020-06-03 DIAGNOSIS — K2101 Gastro-esophageal reflux disease with esophagitis, with bleeding: Secondary | ICD-10-CM | POA: Diagnosis not present

## 2020-06-03 DIAGNOSIS — Z8371 Family history of colonic polyps: Secondary | ICD-10-CM | POA: Diagnosis not present

## 2020-06-05 ENCOUNTER — Other Ambulatory Visit: Payer: Self-pay | Admitting: *Deleted

## 2020-06-05 DIAGNOSIS — Z170 Estrogen receptor positive status [ER+]: Secondary | ICD-10-CM

## 2020-06-05 DIAGNOSIS — C50212 Malignant neoplasm of upper-inner quadrant of left female breast: Secondary | ICD-10-CM

## 2020-06-08 ENCOUNTER — Inpatient Hospital Stay: Payer: PPO | Admitting: Hematology and Oncology

## 2020-06-08 ENCOUNTER — Encounter: Payer: Self-pay | Admitting: Cardiology

## 2020-06-10 DIAGNOSIS — D500 Iron deficiency anemia secondary to blood loss (chronic): Secondary | ICD-10-CM | POA: Diagnosis not present

## 2020-06-10 DIAGNOSIS — Z1159 Encounter for screening for other viral diseases: Secondary | ICD-10-CM | POA: Diagnosis not present

## 2020-06-16 DIAGNOSIS — K922 Gastrointestinal hemorrhage, unspecified: Secondary | ICD-10-CM | POA: Diagnosis not present

## 2020-06-16 DIAGNOSIS — R195 Other fecal abnormalities: Secondary | ICD-10-CM | POA: Diagnosis not present

## 2020-06-16 DIAGNOSIS — R1314 Dysphagia, pharyngoesophageal phase: Secondary | ICD-10-CM | POA: Diagnosis not present

## 2020-06-16 DIAGNOSIS — D500 Iron deficiency anemia secondary to blood loss (chronic): Secondary | ICD-10-CM | POA: Diagnosis not present

## 2020-06-16 DIAGNOSIS — R634 Abnormal weight loss: Secondary | ICD-10-CM | POA: Diagnosis not present

## 2020-06-17 ENCOUNTER — Other Ambulatory Visit: Payer: Self-pay | Admitting: Cardiology

## 2020-06-22 ENCOUNTER — Other Ambulatory Visit: Payer: Self-pay | Admitting: Cardiology

## 2020-06-23 ENCOUNTER — Other Ambulatory Visit: Payer: Self-pay | Admitting: *Deleted

## 2020-06-23 ENCOUNTER — Other Ambulatory Visit: Payer: Self-pay

## 2020-06-23 ENCOUNTER — Encounter: Payer: Self-pay | Admitting: *Deleted

## 2020-06-23 ENCOUNTER — Inpatient Hospital Stay: Payer: PPO | Attending: Hematology and Oncology

## 2020-06-23 ENCOUNTER — Inpatient Hospital Stay: Payer: PPO

## 2020-06-23 ENCOUNTER — Telehealth: Payer: Self-pay

## 2020-06-23 ENCOUNTER — Other Ambulatory Visit: Payer: Self-pay | Admitting: Hematology and Oncology

## 2020-06-23 DIAGNOSIS — Z170 Estrogen receptor positive status [ER+]: Secondary | ICD-10-CM

## 2020-06-23 DIAGNOSIS — D649 Anemia, unspecified: Secondary | ICD-10-CM | POA: Diagnosis not present

## 2020-06-23 DIAGNOSIS — C50212 Malignant neoplasm of upper-inner quadrant of left female breast: Secondary | ICD-10-CM

## 2020-06-23 LAB — CBC WITH DIFFERENTIAL (CANCER CENTER ONLY)
Abs Immature Granulocytes: 0.04 10*3/uL (ref 0.00–0.07)
Basophils Absolute: 0.1 10*3/uL (ref 0.0–0.1)
Basophils Relative: 1 %
Eosinophils Absolute: 0.4 10*3/uL (ref 0.0–0.5)
Eosinophils Relative: 4 %
HCT: 24.1 % — ABNORMAL LOW (ref 36.0–46.0)
Hemoglobin: 6.9 g/dL — CL (ref 12.0–15.0)
Immature Granulocytes: 1 %
Lymphocytes Relative: 13 %
Lymphs Abs: 1.1 10*3/uL (ref 0.7–4.0)
MCH: 23.7 pg — ABNORMAL LOW (ref 26.0–34.0)
MCHC: 28.6 g/dL — ABNORMAL LOW (ref 30.0–36.0)
MCV: 82.8 fL (ref 80.0–100.0)
Monocytes Absolute: 0.4 10*3/uL (ref 0.1–1.0)
Monocytes Relative: 5 %
Neutro Abs: 6.5 10*3/uL (ref 1.7–7.7)
Neutrophils Relative %: 76 %
Platelet Count: 309 10*3/uL (ref 150–400)
RBC: 2.91 MIL/uL — ABNORMAL LOW (ref 3.87–5.11)
RDW: 17.4 % — ABNORMAL HIGH (ref 11.5–15.5)
WBC Count: 8.4 10*3/uL (ref 4.0–10.5)
nRBC: 0 % (ref 0.0–0.2)

## 2020-06-23 LAB — CMP (CANCER CENTER ONLY)
ALT: <6 U/L (ref 0–44)
AST: 9 U/L — ABNORMAL LOW (ref 15–41)
Albumin: 2.8 g/dL — ABNORMAL LOW (ref 3.5–5.0)
Alkaline Phosphatase: 66 U/L (ref 38–126)
Anion gap: 8 (ref 5–15)
BUN: 22 mg/dL (ref 8–23)
CO2: 26 mmol/L (ref 22–32)
Calcium: 8.7 mg/dL — ABNORMAL LOW (ref 8.9–10.3)
Chloride: 107 mmol/L (ref 98–111)
Creatinine: 0.84 mg/dL (ref 0.44–1.00)
GFR, Estimated: >60 mL/min (ref >60)
Glucose, Bld: 88 mg/dL (ref 70–99)
Potassium: 4 mmol/L (ref 3.5–5.1)
Sodium: 141 mmol/L (ref 135–145)
Total Bilirubin: 0.3 mg/dL (ref 0.3–1.2)
Total Protein: 5.7 g/dL — ABNORMAL LOW (ref 6.5–8.1)

## 2020-06-23 LAB — FERRITIN: Ferritin: 26 ng/mL (ref 11–307)

## 2020-06-23 LAB — IRON AND TIBC
Iron: 20 ug/dL — ABNORMAL LOW (ref 41–142)
Saturation Ratios: 7 % — ABNORMAL LOW (ref 21–57)
TIBC: 289 ug/dL (ref 236–444)
UIBC: 268 ug/dL (ref 120–384)

## 2020-06-23 LAB — SAMPLE TO BLOOD BANK

## 2020-06-23 LAB — PREPARE RBC (CROSSMATCH)

## 2020-06-23 MED ORDER — SODIUM CHLORIDE 0.9% IV SOLUTION
250.0000 mL | Freq: Once | INTRAVENOUS | Status: AC
  Administered 2020-06-23: 250 mL via INTRAVENOUS
  Filled 2020-06-23: qty 250

## 2020-06-23 MED ORDER — ACETAMINOPHEN 325 MG PO TABS
ORAL_TABLET | ORAL | Status: AC
  Filled 2020-06-23: qty 2

## 2020-06-23 MED ORDER — DIPHENHYDRAMINE HCL 25 MG PO CAPS
ORAL_CAPSULE | ORAL | Status: AC
  Filled 2020-06-23: qty 1

## 2020-06-23 MED ORDER — ACETAMINOPHEN 325 MG PO TABS
650.0000 mg | ORAL_TABLET | Freq: Once | ORAL | Status: AC
  Administered 2020-06-23: 650 mg via ORAL

## 2020-06-23 MED ORDER — DIPHENHYDRAMINE HCL 25 MG PO CAPS
25.0000 mg | ORAL_CAPSULE | Freq: Once | ORAL | Status: AC
  Administered 2020-06-23: 25 mg via ORAL

## 2020-06-23 NOTE — Telephone Encounter (Signed)
Pt's oncologist office called and said she has a possible GI bleed and would like her to stop eliquis but needed your thoughts on this. Hemoglobin today was 6.2

## 2020-06-23 NOTE — Progress Notes (Signed)
Patient discharged in stable condition.

## 2020-06-23 NOTE — Telephone Encounter (Signed)
Yes she should stop Eliquis

## 2020-06-23 NOTE — Progress Notes (Signed)
CRITICAL VALUE ALERT  Critical Value:  Hgb 6.9  Date & Time Notied:  06/23/20 at 1100  Provider Notified: Nicholas Lose, MD  Orders Received/Actions taken: MD notified, orders received for pt to receive 2 units PRBC's today. Pt reports dark tarry stool and GI upset.  Per MD pt also needing to stop Eliquis and RN LVM with Freda Munro, RN with Dr. Cristina Gong at [facility] regarding pt condition and symptoms.

## 2020-06-23 NOTE — Progress Notes (Signed)
Received call from Freda Munro, [place] with Dr. Cristina Gong at [facility] stating that MD has advised for pt to go to the emergency room for further evaluation of anemia with dark stools. Pt notified and states at this time she does not want to go to the emergency room.  Per Dr. Lindi Adie pt to receive 2 units of PRBC's today and  f/u on 06/25/20 with labs prior.  Dr. Lindi Adie also suggest pt to stop Eliquis and RN notified Baxter Flattery, RN with Dr. [name] (pt cardiologist) regarding current situation.  Baxter Flattery, RN states she will discuss with MD and contact pt for f/u and recommendations.

## 2020-06-23 NOTE — Patient Instructions (Signed)

## 2020-06-24 ENCOUNTER — Telehealth: Payer: Self-pay | Admitting: *Deleted

## 2020-06-24 ENCOUNTER — Other Ambulatory Visit: Payer: Self-pay

## 2020-06-24 DIAGNOSIS — C50212 Malignant neoplasm of upper-inner quadrant of left female breast: Secondary | ICD-10-CM

## 2020-06-24 DIAGNOSIS — D500 Iron deficiency anemia secondary to blood loss (chronic): Secondary | ICD-10-CM | POA: Insufficient documentation

## 2020-06-24 DIAGNOSIS — D649 Anemia, unspecified: Secondary | ICD-10-CM

## 2020-06-24 LAB — TYPE AND SCREEN
ABO/RH(D): AB NEG
Antibody Screen: NEGATIVE
Unit division: 0
Unit division: 0

## 2020-06-24 LAB — BPAM RBC
Blood Product Expiration Date: 202112222359
Blood Product Expiration Date: 202112232359
ISSUE DATE / TIME: 202111301411
ISSUE DATE / TIME: 202111301621
Unit Type and Rh: 600
Unit Type and Rh: 600

## 2020-06-24 NOTE — Assessment & Plan Note (Signed)
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   

## 2020-06-24 NOTE — Assessment & Plan Note (Signed)
Malenotic stools with microcytic anemia: Likely GI Bleed. Since 03/20/2020 She sees Dr.Buchini Eliquis Discontinued Blood transfusion given 06/23/20 (hospitalization for A.Fib with RVR)

## 2020-06-24 NOTE — Progress Notes (Signed)
Patient Care Team: [name], MD as PCP - General (Internal Medicine)  DIAGNOSIS:    ICD-10-CM   1. Malignant neoplasm of upper-inner quadrant of left breast in female, estrogen receptor positive ([place])  C50.212    Z17.0   2. Iron deficiency anemia due to chronic blood loss  D50.0     SUMMARY OF ONCOLOGIC HISTORY: Oncology History  Malignant neoplasm of upper-inner quadrant of left breast in female, estrogen receptor positive ([place])  05/15/2017 Initial Diagnosis   Malignant neoplasm of upper-inner quadrant of left breast in female, estrogen receptor positive ([place]): Microscopic focus suspicious for IDC low-grade   05/30/2017 Surgery   Left lumpectomy: IDC grade 1, 0.8 cm, with low-grade DCIS, ER 100%, PR 50%, Ki-67 5%, HER-2 negative ratio 1.5, T1BN0 stage I a    Miscellaneous   Refused radiation and Anti-estrogen therapy     CHIEF COMPLIANT: Severe iron deficiency anemia due to GI bleeding  INTERVAL HISTORY: Heidi Adams is a 84 y.o. with above-mentioned history of breast cancer treated with lumpectomy, she refused radiation, and she refused antiestrogen therapy. She is currently on surveillance. Mammogram on 01/02/20 showed no evidence of malignancy bilaterally.   She presented to our clinic with severe anemia with black-colored stools related to recent initiation of Eliquis therapy.  We transfused her 2 units of PRBC and she has felt markedly better.  Today her hemoglobin is up to 9.5.  She still requires a wheelchair for ambulation.  She tells me that since she stopped Eliquis and the stools have become brown in color.  She has an appointment to see Dr. Cristina Gong with gastroenterology in couple of weeks.  ALLERGIES:  is allergic to codeine, epinephrine, and tenormin [atenolol].  MEDICATIONS:  Current Outpatient Medications  Medication Sig Dispense Refill  . acetaminophen (TYLENOL) 500 MG tablet Take 1,000 mg by mouth 3 (three) times daily as needed for moderate pain.     Marland Kitchen  amiodarone (PACERONE) 200 MG tablet Take 1 tablet (200 mg total) by mouth daily. 90 tablet 1  . furosemide (LASIX) 40 MG tablet Take 1 tablet (40 mg total) by mouth as directed. Every 3 days and daily  if leg swells 30 tablet   . levothyroxine (SYNTHROID, LEVOTHROID) 112 MCG tablet Take 112-168 mcg by mouth daily before breakfast. Take 1 tablet (112 mcg) on Mondays, Tuesdays, Wednesdays, Thursdays, Fridays & Saturdays. Take 1.5 tablet (168 mcg) on Sundays    . losartan (COZAAR) 25 MG tablet TAKE 1 TABLET BY MOUTH AT BEDTIME 30 tablet 2  . metoprolol succinate (TOPROL-XL) 25 MG 24 hr tablet TAKE 1 TABLET BY MOUTH EVERY DAY 30 tablet 2  . nitrofurantoin, macrocrystal-monohydrate, (MACROBID) 100 MG capsule Take 100 mg by mouth 2 (two) times daily. (Patient not taking: Reported on 04/30/2020)    . pantoprazole (PROTONIX) 40 MG tablet Take 40 mg by mouth daily as needed (indigestion/heartburn.).     Marland Kitchen potassium chloride (KLOR-CON) 10 MEQ tablet Take 1 tablet (10 mEq total) by mouth 2 (two) times daily as needed. With Furosemide only 60 tablet 1   No current facility-administered medications for this visit.    PHYSICAL EXAMINATION: ECOG PERFORMANCE STATUS: 3 - Symptomatic, >50% confined to bed  Vitals:   06/25/20 1347  BP: (!) 169/63  Pulse: 68  Resp: 17  Temp: 98.1 F (36.7 C)  SpO2: 99%   Filed Weights   06/25/20 1347  Weight: 135 lb 6.4 oz (61.4 kg)      LABORATORY DATA:  I  have reviewed the data as listed CMP Latest Ref Rng & Units 06/23/2020 03/27/2020 03/26/2020  Glucose 70 - 99 mg/dL 88 100(H) 93  BUN 8 - 23 mg/dL 22 17 25(H)  Creatinine 0.44 - 1.00 mg/dL 0.84 0.99 1.25(H)  Sodium 135 - 145 mmol/L 141 139 139  Potassium 3.5 - 5.1 mmol/L 4.0 3.7 3.7  Chloride 98 - 111 mmol/L 107 104 103  CO2 22 - 32 mmol/L 26 26 26   Calcium 8.9 - 10.3 mg/dL 8.7(L) 7.9(L) 8.0(L)  Total Protein 6.5 - 8.1 g/dL 5.7(L) - -  Total Bilirubin 0.3 - 1.2 mg/dL 0.3 - -  Alkaline Phos 38 - 126 U/L 66 - -   AST 15 - 41 U/L 9(L) - -  ALT 0 - 44 U/L <6 - -    Lab Results  Component Value Date   WBC 9.3 06/25/2020   HGB 9.5 (L) 06/25/2020   HCT 31.7 (L) 06/25/2020   MCV 85.4 06/25/2020   PLT 311 06/25/2020   NEUTROABS 7.0 06/25/2020    ASSESSMENT & PLAN:  Malignant neoplasm of upper-inner quadrant of left breast in female, estrogen receptor positive (HCC) 05/30/2017:Left lumpectomy: IDC grade 1, 0.8 cm, with low-grade DCIS, ER 100%, PR 50%, Ki-67 5%, HER-2 negative ratio 1.5, T1BN0 stage I a  Did not receive adj XRT Patient also refused antiestrogen therapy  Iron deficiency anemia due to chronic blood loss Malenotic stools with microcytic anemia: Likely GI Bleed. Since 03/20/2020 She sees Dr.Buchini in couple of weeks for follow-up. Eliquis Discontinued Blood transfusion given 06/23/20 (hospitalization for A.Fib with RVR) Today's hemoglobin is 9.5. I recommend giving her 3 doses of IV Venofer (300 mg each)  I will see her back in 1 month with labs.   No orders of the defined types were placed in this encounter.  The patient has a good understanding of the overall plan. she agrees with it. she will call with any problems that may develop before the next visit here.  Total time spent: 30 mins including face to face time and time spent for planning, charting and coordination of care  Nicholas Lose, MD 06/25/2020  I, Cloyde Reams Dorshimer, am acting as scribe for Dr. Nicholas Lose.  I have reviewed the above documentation for accuracy and completeness, and I agree with the above.

## 2020-06-25 ENCOUNTER — Inpatient Hospital Stay: Payer: PPO | Attending: Hematology and Oncology | Admitting: Hematology and Oncology

## 2020-06-25 ENCOUNTER — Inpatient Hospital Stay: Payer: PPO

## 2020-06-25 ENCOUNTER — Other Ambulatory Visit: Payer: Self-pay

## 2020-06-25 DIAGNOSIS — D500 Iron deficiency anemia secondary to blood loss (chronic): Secondary | ICD-10-CM | POA: Diagnosis not present

## 2020-06-25 DIAGNOSIS — D649 Anemia, unspecified: Secondary | ICD-10-CM

## 2020-06-25 DIAGNOSIS — Z170 Estrogen receptor positive status [ER+]: Secondary | ICD-10-CM

## 2020-06-25 DIAGNOSIS — K921 Melena: Secondary | ICD-10-CM | POA: Diagnosis not present

## 2020-06-25 DIAGNOSIS — C50212 Malignant neoplasm of upper-inner quadrant of left female breast: Secondary | ICD-10-CM

## 2020-06-25 DIAGNOSIS — Z853 Personal history of malignant neoplasm of breast: Secondary | ICD-10-CM | POA: Diagnosis not present

## 2020-06-25 LAB — CBC WITH DIFFERENTIAL (CANCER CENTER ONLY)
Abs Immature Granulocytes: 0.03 10*3/uL (ref 0.00–0.07)
Basophils Absolute: 0 10*3/uL (ref 0.0–0.1)
Basophils Relative: 0 %
Eosinophils Absolute: 0.4 10*3/uL (ref 0.0–0.5)
Eosinophils Relative: 4 %
HCT: 31.7 % — ABNORMAL LOW (ref 36.0–46.0)
Hemoglobin: 9.5 g/dL — ABNORMAL LOW (ref 12.0–15.0)
Immature Granulocytes: 0 %
Lymphocytes Relative: 13 %
Lymphs Abs: 1.2 10*3/uL (ref 0.7–4.0)
MCH: 25.6 pg — ABNORMAL LOW (ref 26.0–34.0)
MCHC: 30 g/dL (ref 30.0–36.0)
MCV: 85.4 fL (ref 80.0–100.0)
Monocytes Absolute: 0.5 10*3/uL (ref 0.1–1.0)
Monocytes Relative: 6 %
Neutro Abs: 7 10*3/uL (ref 1.7–7.7)
Neutrophils Relative %: 77 %
Platelet Count: 311 10*3/uL (ref 150–400)
RBC: 3.71 MIL/uL — ABNORMAL LOW (ref 3.87–5.11)
RDW: 17.6 % — ABNORMAL HIGH (ref 11.5–15.5)
WBC Count: 9.3 10*3/uL (ref 4.0–10.5)
nRBC: 0 % (ref 0.0–0.2)

## 2020-06-25 LAB — SAMPLE TO BLOOD BANK

## 2020-06-27 DIAGNOSIS — I480 Paroxysmal atrial fibrillation: Secondary | ICD-10-CM | POA: Diagnosis not present

## 2020-06-27 DIAGNOSIS — I10 Essential (primary) hypertension: Secondary | ICD-10-CM | POA: Diagnosis not present

## 2020-06-27 DIAGNOSIS — I5032 Chronic diastolic (congestive) heart failure: Secondary | ICD-10-CM | POA: Diagnosis not present

## 2020-07-03 ENCOUNTER — Inpatient Hospital Stay: Payer: PPO

## 2020-07-03 ENCOUNTER — Other Ambulatory Visit: Payer: Self-pay

## 2020-07-03 ENCOUNTER — Other Ambulatory Visit: Payer: PPO

## 2020-07-03 VITALS — BP 190/78 | HR 68 | Temp 98.0°F | Resp 17

## 2020-07-03 DIAGNOSIS — D500 Iron deficiency anemia secondary to blood loss (chronic): Secondary | ICD-10-CM | POA: Diagnosis not present

## 2020-07-03 MED ORDER — SODIUM CHLORIDE 0.9 % IV SOLN
Freq: Once | INTRAVENOUS | Status: AC
  Filled 2020-07-03: qty 250

## 2020-07-03 MED ORDER — SODIUM CHLORIDE 0.9 % IV SOLN
300.0000 mg | Freq: Once | INTRAVENOUS | Status: AC
  Administered 2020-07-03: 300 mg via INTRAVENOUS
  Filled 2020-07-03: qty 15

## 2020-07-03 MED ORDER — ACETAMINOPHEN 325 MG PO TABS
ORAL_TABLET | ORAL | Status: AC
  Filled 2020-07-03: qty 2

## 2020-07-03 MED ORDER — ACETAMINOPHEN 325 MG PO TABS
650.0000 mg | ORAL_TABLET | Freq: Once | ORAL | Status: AC
  Administered 2020-07-03: 650 mg via ORAL

## 2020-07-03 NOTE — Patient Instructions (Signed)

## 2020-07-06 ENCOUNTER — Ambulatory Visit: Payer: PPO | Admitting: Hematology and Oncology

## 2020-07-08 ENCOUNTER — Inpatient Hospital Stay: Payer: PPO

## 2020-07-08 ENCOUNTER — Other Ambulatory Visit: Payer: Self-pay

## 2020-07-08 VITALS — BP 180/84 | HR 71 | Temp 97.9°F | Resp 16

## 2020-07-08 DIAGNOSIS — D500 Iron deficiency anemia secondary to blood loss (chronic): Secondary | ICD-10-CM

## 2020-07-08 MED ORDER — SODIUM CHLORIDE 0.9 % IV SOLN
300.0000 mg | Freq: Once | INTRAVENOUS | Status: AC
  Administered 2020-07-08: 300 mg via INTRAVENOUS
  Filled 2020-07-08: qty 15

## 2020-07-08 MED ORDER — ACETAMINOPHEN 325 MG PO TABS
ORAL_TABLET | ORAL | Status: AC
  Filled 2020-07-08: qty 2

## 2020-07-08 MED ORDER — ACETAMINOPHEN 325 MG PO TABS
650.0000 mg | ORAL_TABLET | Freq: Once | ORAL | Status: AC
  Administered 2020-07-08: 650 mg via ORAL

## 2020-07-08 MED ORDER — SODIUM CHLORIDE 0.9 % IV SOLN
Freq: Once | INTRAVENOUS | Status: AC
  Filled 2020-07-08: qty 250

## 2020-07-08 NOTE — Patient Instructions (Signed)

## 2020-07-10 ENCOUNTER — Other Ambulatory Visit: Payer: Self-pay

## 2020-07-10 ENCOUNTER — Inpatient Hospital Stay: Payer: PPO

## 2020-07-10 VITALS — BP 176/75 | HR 70 | Temp 98.6°F | Resp 16

## 2020-07-10 DIAGNOSIS — D500 Iron deficiency anemia secondary to blood loss (chronic): Secondary | ICD-10-CM

## 2020-07-10 MED ORDER — ACETAMINOPHEN 325 MG PO TABS
650.0000 mg | ORAL_TABLET | Freq: Once | ORAL | Status: AC
  Administered 2020-07-10: 650 mg via ORAL

## 2020-07-10 MED ORDER — SODIUM CHLORIDE 0.9 % IV SOLN
300.0000 mg | Freq: Once | INTRAVENOUS | Status: AC
  Administered 2020-07-10: 300 mg via INTRAVENOUS
  Filled 2020-07-10: qty 15

## 2020-07-10 MED ORDER — SODIUM CHLORIDE 0.9 % IV SOLN
Freq: Once | INTRAVENOUS | Status: AC
  Filled 2020-07-10: qty 250

## 2020-07-10 MED ORDER — ACETAMINOPHEN 325 MG PO TABS
ORAL_TABLET | ORAL | Status: AC
  Filled 2020-07-10: qty 2

## 2020-07-10 NOTE — Patient Instructions (Signed)
\[id number]\MOQH Sucrose injection What is this medicine? IRON SUCROSE (AHY ern SOO krohs) is an iron complex. Iron is used to make healthy red blood cells, which carry oxygen and nutrients throughout the body. This medicine is used to treat iron deficiency anemia in people with chronic kidney disease. This medicine may be used for other purposes; ask your health care provider or pharmacist if you have questions. COMMON BRAND NAME(S): Venofer What should I tell my health care provider before I take this medicine? They need to know if you have any of these conditions:  anemia not caused by low iron levels  heart disease  high levels of iron in the blood  kidney disease  liver disease  an unusual or allergic reaction to iron, other medicines, foods, dyes, or preservatives  pregnant or trying to get pregnant  breast-feeding How should I use this medicine? This medicine is for infusion into a vein. It is given by a health care professional in a hospital or clinic setting. Talk to your pediatrician regarding the use of this medicine in children. While this drug may be prescribed for children as young as 2 years for selected conditions, precautions do apply. Overdosage: If you think you have taken too much of this medicine contact a poison control center or emergency room at once. NOTE: This medicine is only for you. Do not share this medicine with others. What if I miss a dose? It is important not to miss your dose. Call your doctor or health care professional if you are unable to keep an appointment. What may interact with this medicine? Do not take this medicine with any of the following medications:  deferoxamine  dimercaprol  other iron products This medicine may also interact with the following medications:  chloramphenicol  deferasirox This list may not describe all possible interactions. Give your health care provider a list of all the medicines, herbs, non-prescription  drugs, or dietary supplements you use. Also tell them if you smoke, drink alcohol, or use illegal drugs. Some items may interact with your medicine. What should I watch for while using this medicine? Visit your doctor or healthcare professional regularly. Tell your doctor or healthcare professional if your symptoms do not start to get better or if they get worse. You may need blood work done while you are taking this medicine. You may need to follow a special diet. Talk to your doctor. Foods that contain iron include: whole grains/cereals, dried fruits, beans, or peas, leafy green vegetables, and organ meats (liver, kidney). What side effects may I notice from receiving this medicine? Side effects that you should report to your doctor or health care professional as soon as possible:  allergic reactions like skin rash, itching or hives, swelling of the face, lips, or tongue  breathing problems  changes in blood pressure  cough  fast, irregular heartbeat  feeling faint or lightheaded, falls  fever or chills  flushing, sweating, or hot feelings  joint or muscle aches/pains  seizures  swelling of the ankles or feet  unusually weak or tired Side effects that usually do not require medical attention (report to your doctor or health care professional if they continue or are bothersome):  diarrhea  feeling achy  headache  irritation at site where injected  nausea, vomiting  stomach upset  tiredness This list may not describe all possible side effects. Call your doctor for medical advice about side effects. You may report side effects to FDA at 1-800-FDA-1088. Where should I keep  my medicine? This drug is given in a hospital or clinic and will not be stored at home. NOTE: This sheet is a summary. It may not cover all possible information. If you have questions about this medicine, talk to your doctor, pharmacist, or health care provider.  2020 Elsevier/Gold Standard  (2011-04-21 17:14:35)

## 2020-07-13 ENCOUNTER — Telehealth: Payer: Self-pay

## 2020-07-13 NOTE — Telephone Encounter (Signed)
Patient called and said  That she is having a lot of SOB since her last infusion, she said its all the time not just on exertion

## 2020-07-13 NOTE — Telephone Encounter (Signed)
Did she discuss with her oncologist or the infusion team. If not a side effect, I am happy to see her

## 2020-07-14 ENCOUNTER — Ambulatory Visit: Payer: PPO | Admitting: Cardiology

## 2020-07-14 ENCOUNTER — Encounter: Payer: Self-pay | Admitting: Cardiology

## 2020-07-14 ENCOUNTER — Telehealth: Payer: Self-pay | Admitting: *Deleted

## 2020-07-14 ENCOUNTER — Other Ambulatory Visit: Payer: Self-pay

## 2020-07-14 VITALS — BP 183/78 | HR 74 | Resp 16 | Ht 64.0 in | Wt 132.0 lb

## 2020-07-14 DIAGNOSIS — I428 Other cardiomyopathies: Secondary | ICD-10-CM

## 2020-07-14 DIAGNOSIS — I5022 Chronic systolic (congestive) heart failure: Secondary | ICD-10-CM | POA: Diagnosis not present

## 2020-07-14 DIAGNOSIS — I480 Paroxysmal atrial fibrillation: Secondary | ICD-10-CM

## 2020-07-14 DIAGNOSIS — R0602 Shortness of breath: Secondary | ICD-10-CM | POA: Diagnosis not present

## 2020-07-14 NOTE — Progress Notes (Signed)
Primary Physician/Referring:  [name], MD  Patient ID: Heidi Adams, female    DOB: 1935/09/24, 84 y.o.   MRN: [id number]  Chief Complaint  Patient presents with  . Shortness of Breath   HPI:    Heidi Adams  is a 84 y.o. female  with bilateral hip replacement and knee replacement and left breast lumpectomy in 2018 without chemotherapy or radiation therapy for breast cancer and also remote hysterectomy due to uterine cancer in 1991, supine hypertension and severe orthostatic hypotension. Has not been able to tolerate any BP medications due to severe dizzy spells.   erwent direct-current cardioversion in the emergency room and converted to sinus rhythm with frequent PACs and brief atrial tachycardia at that time she was also found to be in acute decompensated heart failure felt to be related to A. fib with RVR.  Not tolerating Crestor Farxiga due to severe hypotension.  Due to chronic blood loss, probably related to anticoagulation, she has been receiving iron infusions, she presented with severe anemia in November 2021 and received 2 units of packed RBCs and started on iron infusions due to dark stool and melanotic stool.  She called our office to be seen on an urgent basis due to worsening dyspnea over the past 3 days.  She is also noticed orthopnea.  She has not noticed any leg edema.  Past Medical History:  Diagnosis Date  . Allergy   . Anemia    hx of anemia  . Arthritis   . Breast cancer ([place])    Left breast  . Cancer ([place])    skin ca nose  . Chronic combined systolic and diastolic CHF (congestive heart failure) ([place]) 09/22/2018  . Chronic combined systolic and diastolic CHF (congestive heart failure) ([place]) 09/22/2018  . Chronic combined systolic and diastolic CHF (congestive heart failure) ([place]) 09/22/2018  . Dysrhythmia    went into Atrial Fibrillation after last surgery, HAS PALPITATIONS  . History of skin cancer   . Hypertension   . Hypothyroidism   . PVC (premature  ventricular contraction)   . Thyroiditis 1973   Past Surgical History:  Procedure Laterality Date  . ABDOMINAL HYSTERECTOMY  89   TAH BSO  . APPENDECTOMY    . BREAST LUMPECTOMY Left 05/30/2017  . BREAST LUMPECTOMY WITH RADIOACTIVE SEED LOCALIZATION Left 05/30/2017   Procedure: LEFT BREAST LUMPECTOMY WITH RADIOACTIVE SEED LOCALIZATION;  Surgeon: Excell Seltzer, MD;  Location: [facility];  Service: General;  Laterality: Left;  . CATARACT EXTRACTION  09/10/2018   Right eye  . CHOLECYSTECTOMY     dr Georgia Lopes  . ELBOW SURGERY    . EYE SURGERY     "on my eyelids" years ago  . KNEE ARTHROSCOPY     left  . THYROIDECTOMY  1973  . TONSILLECTOMY    . TOTAL HIP ARTHROPLASTY Right 05/10/2016   Procedure: TOTAL HIP ARTHROPLASTY ANTERIOR APPROACH;  Surgeon: Paralee Cancel, MD;  Location: [facility];  Service: Orthopedics;  Laterality: Right;  . TOTAL HIP ARTHROPLASTY Left 01/10/2017   Procedure: LEFT TOTAL HIP ARTHROPLASTY ANTERIOR APPROACH;  Surgeon: Paralee Cancel, MD;  Location: [facility];  Service: Orthopedics;  Laterality: Left;  . TOTAL KNEE ARTHROPLASTY Left 09/28/2015   Procedure: LEFT TOTAL KNEE ARTHROPLASTY;  Surgeon: Paralee Cancel, MD;  Location: [facility];  Service: Orthopedics;  Laterality: Left;  . TOTAL KNEE ARTHROPLASTY Right 08/09/2016   Procedure: RIGHT TOTAL KNEE ARTHROPLASTY;  Surgeon: Paralee Cancel, MD;  Location: [facility];  Service: Orthopedics;  Laterality: Right;  Adductor Block  . Total Left hip arthroplasty     01/10/17 Dr. Alvan Dame   Family History  Problem Relation Age of Onset  . Diabetes Father   . Hypertension Father   . Stroke Father   . Breast cancer Cousin   . Pneumonia Sister     Social History   Tobacco Use  . Smoking status: Former Smoker    Packs/day: 1.00    Years: 20.00    Pack years: 20.00    Types: Cigarettes    Quit date: 07/26/1987    Years since quitting: 32.9  . Smokeless tobacco: Never Used  Substance Use Topics  . Alcohol use: No     Alcohol/week: 0.0 standard drinks   Marital Status: Married  ROS  Review of Systems  Constitutional: Positive for malaise/fatigue. Negative for diaphoresis and weight gain.  Cardiovascular: Positive for dyspnea on exertion, leg swelling (intermittent, stable) and orthopnea. Negative for chest pain, palpitations and syncope.  Respiratory: Negative for cough.   Musculoskeletal: Positive for arthritis and joint pain. Negative for joint swelling.  Gastrointestinal: Negative for nausea and vomiting.  Neurological: Positive for light-headedness.   Objective  Blood pressure (!) 183/78, pulse 74, resp. rate 16, height 5\' 4"  (1.626 m), weight 132 lb (59.9 kg), last menstrual period 07/25/1988, SpO2 94 %.  Vitals with BMI 07/14/2020 07/10/2020 07/10/2020  Height 5\' 4"  - -  Weight 132 lbs - -  BMI 42.35 - -  Systolic 361 443 154  Diastolic 78 75 80  Pulse 74 70 70    Orthostatic VS for the past 72 hrs (Last 3 readings):  Patient Position BP Location Cuff Size  07/14/20 1313 Sitting Left Arm Normal     Physical Exam Constitutional:      General: She is not in acute distress.    Appearance: She is ill-appearing.     Comments: Moderately build, frail  Cardiovascular:     Rate and Rhythm: Normal rate and regular rhythm.     Pulses: Intact distal pulses.          Radial pulses are 2+ on the right side and 2+ on the left side.       Dorsalis pedis pulses are 1+ on the right side and 1+ on the left side.       Posterior tibial pulses are 1+ on the right side and 1+ on the left side.     Heart sounds: S1 normal and S2 normal. No murmur heard. No gallop.      Comments: Elevated  JVD by 8 cm. Trace pedal edema and 1-2+ ankle edema pitting. Pulmonary:     Effort: No accessory muscle usage or respiratory distress.     Breath sounds: Examination of the right-lower field reveals rhonchi. Examination of the left-lower field reveals rhonchi. Rhonchi (bibasilar coarse crackles) present.     Laboratory examination:   Recent Labs    03/25/20 0452 03/26/20 0413 03/27/20 1458 06/23/20 1100  NA 137 139 139 141  K 3.7 3.7 3.7 4.0  CL 101 103 104 107  CO2 26 26 26 26   GLUCOSE 112* 93 100* 88  BUN 30* 25* 17 22  CREATININE 1.25* 1.25* 0.99 0.84  CALCIUM 8.2* 8.0* 7.9* 8.7*  GFRNONAA 40* 40* 53* >60  GFRAA 47* 47* >60  --    CrCl cannot be calculated (Patient's most recent lab result is older than the maximum 21 days allowed.).  CMP Latest Ref Rng & Units 06/23/2020 03/27/2020  03/26/2020  Glucose 70 - 99 mg/dL 88 100(H) 93  BUN 8 - 23 mg/dL 22 17 25(H)  Creatinine 0.44 - 1.00 mg/dL 0.84 0.99 1.25(H)  Sodium 135 - 145 mmol/L 141 139 139  Potassium 3.5 - 5.1 mmol/L 4.0 3.7 3.7  Chloride 98 - 111 mmol/L 107 104 103  CO2 22 - 32 mmol/L 26 26 26   Calcium 8.9 - 10.3 mg/dL 8.7(L) 7.9(L) 8.0(L)  Total Protein 6.5 - 8.1 g/dL 5.7(L) - -  Total Bilirubin 0.3 - 1.2 mg/dL 0.3 - -  Alkaline Phos 38 - 126 U/L 66 - -  AST 15 - 41 U/L 9(L) - -  ALT 0 - 44 U/L <6 - -   CBC Latest Ref Rng & Units 06/25/2020 06/23/2020 03/27/2020  WBC 4.0 - 10.5 K/uL 9.3 8.4 14.3(H)  Hemoglobin 12.0 - 15.0 g/dL 9.5(L) 6.9(LL) 9.0(L)  Hematocrit 36.0 - 46.0 % 31.7(L) 24.1(L) 31.4(L)  Platelets 150 - 400 K/uL 311 309 449(H)   Lipid Panel     Component Value Date/Time   CHOL 142 07/31/2018 0655   TRIG 72 07/31/2018 0655   HDL 45 07/31/2018 0655   CHOLHDL 3.2 07/31/2018 0655   VLDL 14 07/31/2018 0655   LDLCALC 83 07/31/2018 0655   HEMOGLOBIN A1C Lab Results  Component Value Date   HGBA1C 5.0 07/31/2018   MPG 96.8 07/31/2018   TSH Recent Labs    03/21/20 2329  TSH 5.095*   BNP (last 3 results) Recent Labs    03/20/20 1043 03/21/20 2116  BNP 874.3* 1,091.1*    External labs:   Lipid Panel completed 04/09/2019 HDL 38 MG/DL 04/09/2019 LDL 80.000 mg 04/09/2019 Cholesterol, total 143.000 m 04/09/2019 Triglycerides 127.000 04/09/2019  A1C 5.000 07/31/2018  Glucose Random 110.000 m  11/04/2019 MicroAlbumin Urine 9.000 04/25/2019 MicroAlbumin/Creat 16.8 MG/ 04/25/2019 BUN 18.000 mg 11/04/2019 Creatinine, Serum 0.700 mg/ 11/04/2019  TSH 2.620 micr 11/04/2019  Medications and allergies   Allergies  Allergen Reactions  . Codeine Nausea Only  . Epinephrine     Tachcardyia, chest pains tremors  . Tenormin [Atenolol] Rash    Outpatient Medications Prior to Visit  Medication Sig Dispense Refill  . acetaminophen (TYLENOL) 500 MG tablet Take 1,000 mg by mouth 3 (three) times daily as needed for moderate pain.     Marland Kitchen amiodarone (PACERONE) 200 MG tablet Take 1 tablet (200 mg total) by mouth daily. 90 tablet 1  . levothyroxine (SYNTHROID, LEVOTHROID) 112 MCG tablet Take 112-168 mcg by mouth daily before breakfast. Take 1 tablet (112 mcg) on Mondays, Tuesdays, Wednesdays, Thursdays, Fridays & Saturdays. Take 1.5 tablet (168 mcg) on Sundays    . losartan (COZAAR) 25 MG tablet TAKE 1 TABLET BY MOUTH AT BEDTIME 30 tablet 2  . metoprolol succinate (TOPROL-XL) 25 MG 24 hr tablet TAKE 1 TABLET BY MOUTH EVERY DAY 30 tablet 2  . nitrofurantoin, macrocrystal-monohydrate, (MACROBID) 100 MG capsule Take 100 mg by mouth 2 (two) times daily.    . pantoprazole (PROTONIX) 40 MG tablet Take 40 mg by mouth daily as needed (indigestion/heartburn.).     Marland Kitchen potassium chloride (KLOR-CON) 10 MEQ tablet Take 1 tablet (10 mEq total) by mouth 2 (two) times daily as needed. With Furosemide only 60 tablet 1  . furosemide (LASIX) 40 MG tablet Take 1 tablet (40 mg total) by mouth as directed. Every 3 days and daily  if leg swells 30 tablet    No facility-administered medications prior to visit.    Radiology:   No results  found.  Cardiac Studies:   Lexiscan Myoview stress test  04/23/2018: 1. The resting electrocardiogram demonstrated normal sinus rhythm, normal resting conduction and no resting arrhythmias.  Stress EKG is non-diagnostic for ischemia as it a pharmacologic stress using Lexiscan. Occasional  PAC and PVC. The patient developed significant symptoms which included Chest Pain, Stomach pain, Headache.  2. The overall quality of the study is good. There is no evidence of abnormal lung activity. Stress and rest SPECT images demonstrate homogeneous tracer distribution throughout the myocardium. Gated SPECT imaging reveals diffuse global hypokinesis. The left ventricular ejection fraction was markedly depressed at (26%).   3. This is a high risk study due to severe LV systolic dysfunction. Findings may represent non ischemic cardiomyopathy.  Echocardiogram 03/10/2020:  Left ventricle cavity is normal in size. Moderate concentric hypertrophy of the left ventricle. Severe global hypokinesis. LVEF 15-20%. Doppler evidence of grade II (pseudonormal) diastolic dysfunction, elevated LAP.  The aortic root is mildly dilated 3.9 cm.  Left atrial cavity is severely dilated at 65 cc/m2.  Trileaflet aortic valve. Moderate (Grade II) aortic regurgitation.  Moderate (Grade III) mitral regurgitation.  Mild tricuspid regurgitation. Estimated pulmonary artery systolic pressure 38 mmHg.  Mild pulmonic regurgitation.  Previous study on 07/30/2018, EF 40% (reported normal)    EKG    EKG 07/14/2020: Normal sinus rhythm with rate of 77 bpm, left axis deviation, left intrafascicular block.  Poor R wave progression, cannot exclude anteroseptal infarct old.  Normal QT interval.  Single PAC.  No change from 01/10/2020.  EKG 03/25/2020: Atrial fibrillation with rapid ventricular response at the rate of 140 bpm, left axis deviation, left intrafascicular block.  Poor R wave progression, cannot exclude anteroseptal infarct old.  Nonspecific ST-T abnormality, cannot exclude lateral ischemia.   Assessment     ICD-10-CM   1. Shortness of breath  R06.02 EKG 12-Lead  2. Chronic systolic heart failure (HCC)  I50.22   3. Non-ischemic cardiomyopathy ([place])  I42.8   4. Paroxysmal atrial fibrillation (HCC)  I48.0     No  orders of the defined types were placed in this encounter.   There are no discontinued medications.  CHA2DS2-VASc Score is 5.  Yearly risk of stroke: 7.2% (A, F, HTN, CHF).  Score of 1=0.6; 2=2.2; 3=3.2; 4=4.8; 5=7.2; 6=9.8; 7=>9.8) -(CHF; HTN; vasc disease DM,  Female = 1; Age <65 =0; 65-74 = 1,  >75 =2; stroke/embolism= 2).   Recommendations:   REE ALCALDE  is a 84 y.o. female  with bilateral hip replacement and knee replacement and left breast lumpectomy in 2018 without chemotherapy or radiation therapy for breast cancer and also remote hysterectomy due to uterine cancer in 1991, supine hypertension and orthostatic hypotension, paroxysmal atrial fibrillation on chronic Eliquis, chronic dizziness on standing. Has not been able to tolerate any BP medications due to severe dizzy spells.  Admitted to the hospital on 03/21/2020 and discharged on 03/27/2020 with E. coli urosepsis and bacteremia, on atrial fibrillation with RVR.  She underwent direct-current cardioversion in the emergency room and converted to sinus rhythm with frequent PACs and brief atrial tachycardia at that time she was also found to be in acute decompensated heart failure felt to be related to A. fib with RVR.  Not tolerating Crestor Farxiga due to severe hypotension.  Due to chronic blood loss, probably related to anticoagulation, she has been receiving iron infusions, she presented with severe anemia in November 2021 and received 2 units of packed RBCs and started on iron infusions due  to dark stool and melanotic stool.  I reviewed her EKG, she is fortunately maintaining sinus rhythm.  He is presently on amiodarone and she is tolerating this well.  Suspect her fluid overload state is probably related to blood transfusion and also iron infusions that she has been receiving.  Advised her to increase her furosemide which she is responding well to 40 mg twice daily for the next 1 to 2 days, then 1 pill once a day for the next 3 to 4 days  and once she is stable and back to baseline to go back to using it as needed.  She has been scheduled for echocardiogram in the next 2 to 3 weeks, I will try to obtain this sooner than that.  Suspect her LVEF is improved.  I reassured her.  Husband is present and all questions answered.  She has not had any further melanotic stools.  I have not started her back on Eliquis although her CHA2DS2-VASc score is high in view of severe anemia needing blood transfusion.   Adrian Prows, MD, [facility] 07/14/2020, 2:42 PM Office: [phone]

## 2020-07-14 NOTE — Telephone Encounter (Signed)
Received call from pt with complaint of severe SHOB with rest and exertion.  Pt requesting advice from MD if symptoms could be related to IV iron she received last week.  Per MD symptoms not related to IV iron infusion and pt needing to f/u with cardiologist.  Pt verbalized understanding and states she has an apt this afternoon with Cardiologist.

## 2020-07-14 NOTE — Patient Instructions (Signed)
Suspect the iron transfusion and also blood transfusion that he received has put you into fluid overload state, congestive heart failure.  For the next 1 to 2 days go ahead and increase your furosemide to 40 mg twice daily and once you start feeling better and dyspnea/shortness of breath improves he can reduce it back to 1 pill once a day and when it is completely resolved he can take it only when you need it.  It was great to see you today.

## 2020-07-16 ENCOUNTER — Other Ambulatory Visit: Payer: PPO

## 2020-07-23 ENCOUNTER — Ambulatory Visit: Payer: PPO

## 2020-07-23 ENCOUNTER — Other Ambulatory Visit: Payer: Self-pay

## 2020-07-23 DIAGNOSIS — I5022 Chronic systolic (congestive) heart failure: Secondary | ICD-10-CM | POA: Diagnosis not present

## 2020-07-23 DIAGNOSIS — I480 Paroxysmal atrial fibrillation: Secondary | ICD-10-CM

## 2020-07-28 ENCOUNTER — Other Ambulatory Visit: Payer: PPO

## 2020-07-28 DIAGNOSIS — I10 Essential (primary) hypertension: Secondary | ICD-10-CM | POA: Diagnosis not present

## 2020-07-28 DIAGNOSIS — I5032 Chronic diastolic (congestive) heart failure: Secondary | ICD-10-CM | POA: Diagnosis not present

## 2020-07-28 DIAGNOSIS — I480 Paroxysmal atrial fibrillation: Secondary | ICD-10-CM | POA: Diagnosis not present

## 2020-07-29 ENCOUNTER — Other Ambulatory Visit: Payer: Self-pay | Admitting: *Deleted

## 2020-07-29 DIAGNOSIS — D500 Iron deficiency anemia secondary to blood loss (chronic): Secondary | ICD-10-CM

## 2020-07-29 NOTE — Progress Notes (Signed)
Patient Care Team: [name], MD as PCP - General (Internal Medicine)  DIAGNOSIS:    ICD-10-CM   1. Malignant neoplasm of upper-inner quadrant of left breast in female, estrogen receptor positive ([place])  C50.212    Z17.0   2. Iron deficiency anemia due to chronic blood loss  D50.0 CBC with Differential (Cancer Center Only)    Iron and TIBC    Ferritin    CMP ([place] only)    SUMMARY OF ONCOLOGIC HISTORY: Oncology History  Malignant neoplasm of upper-inner quadrant of left breast in female, estrogen receptor positive ([place])  05/15/2017 Initial Diagnosis   Malignant neoplasm of upper-inner quadrant of left breast in female, estrogen receptor positive ([place]): Microscopic focus suspicious for IDC low-grade   05/30/2017 Surgery   Left lumpectomy: IDC grade 1, 0.8 cm, with low-grade DCIS, ER 100%, PR 50%, Ki-67 5%, HER-2 negative ratio 1.5, T1BN0 stage I a    Miscellaneous   Refused radiation and Anti-estrogen therapy     CHIEF COMPLIANT: Severe iron deficiency anemia due to GI bleeding  INTERVAL HISTORY: Heidi Adams is a 85 y.o. with above-mentioned history of breast cancer treated with lumpectomy, sherefused radiationand antiestrogen therapy. She is currently on surveillance. She also has a history of severe iron deficiency anemia due to a GI bleed following initiation of Eliquis for which she has received PRBCs and 3 doses of Venofer. She presents to the clinic today for follow-up.  Today her hemoglobin is 10 g.  She tells me that she continues to feel tired.  She is able to walk with support at home.  She requires a wheelchair everyone else.  She spends a lot of time at her [facility].  ALLERGIES:  is allergic to codeine, epinephrine, and tenormin [atenolol].  MEDICATIONS:  Current Outpatient Medications  Medication Sig Dispense Refill  . acetaminophen (TYLENOL) 500 MG tablet Take 1,000 mg by mouth 3 (three) times daily as needed for moderate pain.     Marland Kitchen amiodarone  (PACERONE) 200 MG tablet Take 1 tablet (200 mg total) by mouth daily. 90 tablet 1  . furosemide (LASIX) 40 MG tablet Take 1 tablet (40 mg total) by mouth as directed. Every 3 days and daily  if leg swells 30 tablet   . levothyroxine (SYNTHROID, LEVOTHROID) 112 MCG tablet Take 112-168 mcg by mouth daily before breakfast. Take 1 tablet (112 mcg) on Mondays, Tuesdays, Wednesdays, Thursdays, Fridays & Saturdays. Take 1.5 tablet (168 mcg) on Sundays    . losartan (COZAAR) 25 MG tablet TAKE 1 TABLET BY MOUTH AT BEDTIME 30 tablet 2  . metoprolol succinate (TOPROL-XL) 25 MG 24 hr tablet TAKE 1 TABLET BY MOUTH EVERY DAY 30 tablet 2  . nitrofurantoin, macrocrystal-monohydrate, (MACROBID) 100 MG capsule Take 100 mg by mouth 2 (two) times daily.    . pantoprazole (PROTONIX) 40 MG tablet Take 40 mg by mouth daily as needed (indigestion/heartburn.).     Marland Kitchen potassium chloride (KLOR-CON) 10 MEQ tablet Take 1 tablet (10 mEq total) by mouth 2 (two) times daily as needed. With Furosemide only 60 tablet 1   No current facility-administered medications for this visit.    PHYSICAL EXAMINATION: ECOG PERFORMANCE STATUS: 1 - Symptomatic but completely ambulatory  Vitals:   07/30/20 1122  BP: (!) 172/71  Pulse: 62  Resp: 15  Temp: (!) 97.3 F (36.3 C)  SpO2: 97%   Filed Weights   07/30/20 1122  Weight: 130 lb (59 kg)    LABORATORY DATA:  I  have reviewed the data as listed CMP Latest Ref Rng & Units 06/23/2020 03/27/2020 03/26/2020  Glucose 70 - 99 mg/dL 88 100(H) 93  BUN 8 - 23 mg/dL 22 17 25(H)  Creatinine 0.44 - 1.00 mg/dL 0.84 0.99 1.25(H)  Sodium 135 - 145 mmol/L 141 139 139  Potassium 3.5 - 5.1 mmol/L 4.0 3.7 3.7  Chloride 98 - 111 mmol/L 107 104 103  CO2 22 - 32 mmol/L 26 26 26   Calcium 8.9 - 10.3 mg/dL 8.7(L) 7.9(L) 8.0(L)  Total Protein 6.5 - 8.1 g/dL 5.7(L) - -  Total Bilirubin 0.3 - 1.2 mg/dL 0.3 - -  Alkaline Phos 38 - 126 U/L 66 - -  AST 15 - 41 U/L 9(L) - -  ALT 0 - 44 U/L <6 - -    Lab  Results  Component Value Date   WBC 10.1 07/30/2020   HGB 10.0 (L) 07/30/2020   HCT 32.7 (L) 07/30/2020   MCV 83.4 07/30/2020   PLT 262 07/30/2020   NEUTROABS 8.3 (H) 07/30/2020    ASSESSMENT & PLAN:  Malignant neoplasm of upper-inner quadrant of left breast in female, estrogen receptor positive (HCC) 05/30/2017:Left lumpectomy: IDC grade 1, 0.8 cm, with low-grade DCIS, ER 100%, PR 50%, Ki-67 5%, HER-2 negative ratio 1.5, T1BN0 stage I a  Did not receive adj XRT Patient also refused antiestrogen therapy  Iron deficiency anemia due to chronic blood loss Complex anemia due to blood loss and also anemia of chronic disease  Malenotic stools with microcytic anemia: Likely GI Bleed. Since 03/20/2020 Follows with gastroenterology with Dr. Cristina Gong Eliquis Discontinued Blood transfusion given 06/23/20 (hospitalization for A.Fib with RVR)  IV iron: Venofer December 2021 Lab review: Hemoglobin 10, MCV 83.4, RDW 19.6  Return to [facility] in 3 months with labs and follow-up   Orders Placed This Encounter  Procedures  . CBC with Differential (Cancer Center Only)    Standing Status:   Future    Standing Expiration Date:   07/30/2021  . Iron and TIBC    Standing Status:   Future    Standing Expiration Date:   07/30/2021  . Ferritin    Standing Status:   Future    Standing Expiration Date:   07/30/2021  . CMP ([facility] only)    Standing Status:   Future    Standing Expiration Date:   07/30/2021   The patient has a good understanding of the overall plan. she agrees with it. she will call with any problems that may develop before the next visit here.  Total time spent: 20 mins including face to face time and time spent for planning, charting and coordination of care  Nicholas Lose, MD 07/30/2020  I, Cloyde Reams Dorshimer, am acting as scribe for Dr. Nicholas Lose.  I have reviewed the above documentation for accuracy and completeness, and I agree with the above.

## 2020-07-30 ENCOUNTER — Inpatient Hospital Stay: Payer: PPO | Attending: Hematology and Oncology | Admitting: Hematology and Oncology

## 2020-07-30 ENCOUNTER — Other Ambulatory Visit: Payer: Self-pay

## 2020-07-30 ENCOUNTER — Inpatient Hospital Stay: Payer: PPO

## 2020-07-30 DIAGNOSIS — C50212 Malignant neoplasm of upper-inner quadrant of left female breast: Secondary | ICD-10-CM | POA: Diagnosis not present

## 2020-07-30 DIAGNOSIS — K922 Gastrointestinal hemorrhage, unspecified: Secondary | ICD-10-CM | POA: Diagnosis not present

## 2020-07-30 DIAGNOSIS — R5383 Other fatigue: Secondary | ICD-10-CM | POA: Diagnosis not present

## 2020-07-30 DIAGNOSIS — C50512 Malignant neoplasm of lower-outer quadrant of left female breast: Secondary | ICD-10-CM | POA: Diagnosis present

## 2020-07-30 DIAGNOSIS — Z170 Estrogen receptor positive status [ER+]: Secondary | ICD-10-CM | POA: Insufficient documentation

## 2020-07-30 DIAGNOSIS — Z79899 Other long term (current) drug therapy: Secondary | ICD-10-CM | POA: Insufficient documentation

## 2020-07-30 DIAGNOSIS — D500 Iron deficiency anemia secondary to blood loss (chronic): Secondary | ICD-10-CM | POA: Insufficient documentation

## 2020-07-30 LAB — CBC WITH DIFFERENTIAL (CANCER CENTER ONLY)
Abs Immature Granulocytes: 0.06 10*3/uL (ref 0.00–0.07)
Basophils Absolute: 0 10*3/uL (ref 0.0–0.1)
Basophils Relative: 0 %
Eosinophils Absolute: 0.1 10*3/uL (ref 0.0–0.5)
Eosinophils Relative: 1 %
HCT: 32.7 % — ABNORMAL LOW (ref 36.0–46.0)
Hemoglobin: 10 g/dL — ABNORMAL LOW (ref 12.0–15.0)
Immature Granulocytes: 1 %
Lymphocytes Relative: 10 %
Lymphs Abs: 1 10*3/uL (ref 0.7–4.0)
MCH: 25.5 pg — ABNORMAL LOW (ref 26.0–34.0)
MCHC: 30.6 g/dL (ref 30.0–36.0)
MCV: 83.4 fL (ref 80.0–100.0)
Monocytes Absolute: 0.5 10*3/uL (ref 0.1–1.0)
Monocytes Relative: 5 %
Neutro Abs: 8.3 10*3/uL — ABNORMAL HIGH (ref 1.7–7.7)
Neutrophils Relative %: 83 %
Platelet Count: 262 10*3/uL (ref 150–400)
RBC: 3.92 MIL/uL (ref 3.87–5.11)
RDW: 19.6 % — ABNORMAL HIGH (ref 11.5–15.5)
WBC Count: 10.1 10*3/uL (ref 4.0–10.5)
nRBC: 0 % (ref 0.0–0.2)

## 2020-07-30 LAB — IRON AND TIBC
Iron: 31 ug/dL — ABNORMAL LOW (ref 41–142)
Saturation Ratios: 14 % — ABNORMAL LOW (ref 21–57)
TIBC: 230 ug/dL — ABNORMAL LOW (ref 236–444)
UIBC: 199 ug/dL (ref 120–384)

## 2020-07-30 LAB — SAMPLE TO BLOOD BANK

## 2020-07-30 LAB — FERRITIN: Ferritin: 605 ng/mL — ABNORMAL HIGH (ref 11–307)

## 2020-07-30 NOTE — Assessment & Plan Note (Signed)
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

## 2020-07-30 NOTE — Assessment & Plan Note (Signed)
Iron deficiency anemia due to chronic blood loss Malenotic stools with microcytic anemia: Likely GI Bleed. Since 03/20/2020 Follows with gastroenterology with Dr. Matthias Hughs Eliquis Discontinued Blood transfusion given 06/23/20 (hospitalization for A.Fib with RVR)  IV iron: Venofer December 2021 Lab review:

## 2020-07-31 DIAGNOSIS — K2101 Gastro-esophageal reflux disease with esophagitis, with bleeding: Secondary | ICD-10-CM | POA: Diagnosis not present

## 2020-07-31 DIAGNOSIS — R197 Diarrhea, unspecified: Secondary | ICD-10-CM | POA: Diagnosis not present

## 2020-07-31 DIAGNOSIS — D500 Iron deficiency anemia secondary to blood loss (chronic): Secondary | ICD-10-CM | POA: Diagnosis not present

## 2020-08-04 ENCOUNTER — Ambulatory Visit: Payer: PPO | Admitting: Cardiology

## 2020-08-05 ENCOUNTER — Encounter: Payer: Self-pay | Admitting: Cardiology

## 2020-08-05 ENCOUNTER — Ambulatory Visit: Payer: PPO | Admitting: Cardiology

## 2020-08-05 ENCOUNTER — Other Ambulatory Visit: Payer: Self-pay

## 2020-08-05 VITALS — BP 185/80 | HR 63 | Resp 16 | Ht 64.0 in | Wt 130.8 lb

## 2020-08-05 DIAGNOSIS — I10 Essential (primary) hypertension: Secondary | ICD-10-CM | POA: Diagnosis not present

## 2020-08-05 DIAGNOSIS — I951 Orthostatic hypotension: Secondary | ICD-10-CM

## 2020-08-05 DIAGNOSIS — I480 Paroxysmal atrial fibrillation: Secondary | ICD-10-CM

## 2020-08-05 DIAGNOSIS — I5022 Chronic systolic (congestive) heart failure: Secondary | ICD-10-CM | POA: Diagnosis not present

## 2020-08-05 DIAGNOSIS — I428 Other cardiomyopathies: Secondary | ICD-10-CM | POA: Diagnosis not present

## 2020-08-05 MED ORDER — LOSARTAN POTASSIUM 50 MG PO TABS: 50.0000 mg | ORAL_TABLET | Freq: Every day | ORAL | 1 refills | Status: DC

## 2020-08-05 MED ORDER — METOPROLOL SUCCINATE ER 50 MG PO TB24: 50.0000 mg | ORAL_TABLET | Freq: Every day | ORAL | 1 refills | Status: DC

## 2020-08-05 MED ORDER — AMIODARONE HCL 200 MG PO TABS: 100.0000 mg | ORAL_TABLET | Freq: Every day | ORAL | 1 refills | Status: DC

## 2020-08-05 NOTE — Progress Notes (Signed)
Primary Physician/Referring:  [name], MD  Patient ID: Heidi Adams, female    DOB: 1936/03/25, 85 y.o.   MRN: [id number]  Chief Complaint  Patient presents with  . Congestive Heart Failure  . Follow-up    3 month   HPI:    Heidi Adams  is a 85 y.o. female  with bilateral hip replacement and knee replacement and left breast lumpectomy in 2018 without chemotherapy or radiation therapy for breast cancer and also remote hysterectomy due to uterine cancer in 1991, supine hypertension and orthostatic hypotension, paroxysmal atrial fibrillation on chronic Eliquis, chronic dizziness on standing. Has not been able to tolerate any BP medications due to severe dizzy spells.  Admitted to the hospital on 03/21/2020 and discharged on 03/27/2020 with E. coli urosepsis and bacteremia, on atrial fibrillation with RVR, S/P direct-current cardioversion to sinus rhythm, acute decompensated heart failure.  Also received 2 units of packed RBCs due to GI bleed.  She also received iron infusion.   Due to chronic blood loss, probably related to anticoagulation, severe anemia in November 2021 and received 2 units of packed RBCs, she is now off of anticoagulation.  She now presents for a 6-week office visit, follow-up of heart failure and atrial fibrillation and hypertension.  States that she is starting to feel better, still has fatigue, she has not had any further dark melanotic stools but states that she still seems to be tired.  Husband present.  Past Medical History:  Diagnosis Date  . Allergy   . Anemia    hx of anemia  . Arthritis   . Breast cancer ([place])    Left breast  . Cancer ([place])    skin ca nose  . Chronic combined systolic and diastolic CHF (congestive heart failure) ([place]) 09/22/2018  . Chronic combined systolic and diastolic CHF (congestive heart failure) ([place]) 09/22/2018  . Chronic combined systolic and diastolic CHF (congestive heart failure) ([place]) 09/22/2018  . Dysrhythmia    went into  Atrial Fibrillation after last surgery, HAS PALPITATIONS  . History of skin cancer   . Hypertension   . Hypothyroidism   . PVC (premature ventricular contraction)   . Thyroiditis 1973   Past Surgical History:  Procedure Laterality Date  . ABDOMINAL HYSTERECTOMY  90   TAH BSO  . APPENDECTOMY    . BREAST LUMPECTOMY Left 05/30/2017  . BREAST LUMPECTOMY WITH RADIOACTIVE SEED LOCALIZATION Left 05/30/2017   Procedure: LEFT BREAST LUMPECTOMY WITH RADIOACTIVE SEED LOCALIZATION;  Surgeon: Excell Seltzer, MD;  Location: [facility];  Service: General;  Laterality: Left;  . CATARACT EXTRACTION  09/10/2018   Right eye  . CHOLECYSTECTOMY     dr Georgia Lopes  . ELBOW SURGERY    . EYE SURGERY     "on my eyelids" years ago  . KNEE ARTHROSCOPY     left  . THYROIDECTOMY  1973  . TONSILLECTOMY    . TOTAL HIP ARTHROPLASTY Right 05/10/2016   Procedure: TOTAL HIP ARTHROPLASTY ANTERIOR APPROACH;  Surgeon: Paralee Cancel, MD;  Location: [facility];  Service: Orthopedics;  Laterality: Right;  . TOTAL HIP ARTHROPLASTY Left 01/10/2017   Procedure: LEFT TOTAL HIP ARTHROPLASTY ANTERIOR APPROACH;  Surgeon: Paralee Cancel, MD;  Location: [facility];  Service: Orthopedics;  Laterality: Left;  . TOTAL KNEE ARTHROPLASTY Left 09/28/2015   Procedure: LEFT TOTAL KNEE ARTHROPLASTY;  Surgeon: Paralee Cancel, MD;  Location: [facility];  Service: Orthopedics;  Laterality: Left;  . TOTAL KNEE ARTHROPLASTY Right 08/09/2016   Procedure:  RIGHT TOTAL KNEE ARTHROPLASTY;  Surgeon: Paralee Cancel, MD;  Location: [facility];  Service: Orthopedics;  Laterality: Right;  Adductor Block  . Total Left hip arthroplasty     01/10/17 Dr. Alvan Dame   Family History  Problem Relation Age of Onset  . Diabetes Father   . Hypertension Father   . Stroke Father   . Breast cancer Cousin   . Pneumonia Sister     Social History   Tobacco Use  . Smoking status: Former Smoker    Packs/day: 1.00    Years: 20.00    Pack years: 20.00    Types: Cigarettes     Quit date: 07/26/1987    Years since quitting: 33.0  . Smokeless tobacco: Never Used  Substance Use Topics  . Alcohol use: No    Alcohol/week: 0.0 standard drinks   Marital Status: Married  ROS  Review of Systems  Constitutional: Positive for malaise/fatigue. Negative for diaphoresis and weight gain.  Cardiovascular: Positive for dyspnea on exertion (mild), leg swelling (intermittent, stable and mild) and orthopnea. Negative for chest pain, palpitations and syncope.  Respiratory: Negative for cough.   Musculoskeletal: Positive for arthritis and joint pain. Negative for joint swelling.  Gastrointestinal: Negative for nausea and vomiting.  Neurological: Positive for loss of balance. Negative for light-headedness.   Objective  Blood pressure (!) 185/80, pulse 63, resp. rate 16, height 5\' 4"  (1.626 m), weight 130 lb 12.8 oz (59.3 kg), last menstrual period 07/25/1988, SpO2 95 %.  Vitals with BMI 08/05/2020 07/30/2020 07/14/2020  Height 5\' 4"  5\' 4"  5\' 4"   Weight 130 lbs 13 oz 130 lbs 132 lbs  BMI 22.44 [id number]  Systolic [id number]  Diastolic 80 71 78  Pulse 63 62 74    Orthostatic VS for the past 72 hrs (Last 3 readings):  Patient Position BP Location Cuff Size  08/05/20 1415 Sitting Left Arm Normal     Physical Exam Constitutional:      General: She is not in acute distress.    Appearance: She is ill-appearing.     Comments: Moderately build, frail  Cardiovascular:     Rate and Rhythm: Normal rate and regular rhythm.     Pulses: Intact distal pulses.          Radial pulses are 2+ on the right side and 2+ on the left side.       Dorsalis pedis pulses are 1+ on the right side and 1+ on the left side.       Posterior tibial pulses are 1+ on the right side and 1+ on the left side.     Heart sounds: S1 normal and S2 normal. No murmur heard. No gallop.      Comments: Elevated  JVD by 8 cm. Trace pedal edema and 1-2+ ankle edema pitting. Pulmonary:     Effort: No accessory muscle  usage or respiratory distress.     Breath sounds: Examination of the right-lower field reveals rhonchi. Examination of the left-lower field reveals rhonchi. Rhonchi (bibasilar coarse crackles, right worse) present.    Laboratory examination:   Recent Labs    03/25/20 0452 03/26/20 0413 03/27/20 1458 06/23/20 1100  NA 137 139 139 141  K 3.7 3.7 3.7 4.0  CL 101 103 104 107  CO2 26 26 26 26   GLUCOSE 112* 93 100* 88  BUN 30* 25* 17 22  CREATININE 1.25* 1.25* 0.99 0.84  CALCIUM 8.2* 8.0* 7.9* 8.7*  GFRNONAA 40* 40* 53* >60  GFRAA 47* 47* >60  --    CrCl cannot be calculated (Patient's most recent lab result is older than the maximum 21 days allowed.).  CMP Latest Ref Rng & Units 06/23/2020 03/27/2020 03/26/2020  Glucose 70 - 99 mg/dL 88 240(X) 93  BUN 8 - 23 mg/dL 22 17 73(Z)  Creatinine 0.44 - 1.00 mg/dL 3.29 9.24 2.68(T)  Sodium 135 - 145 mmol/L 141 139 139  Potassium 3.5 - 5.1 mmol/L 4.0 3.7 3.7  Chloride 98 - 111 mmol/L 107 104 103  CO2 22 - 32 mmol/L 26 26 26   Calcium 8.9 - 10.3 mg/dL 4.1(D) 7.9(L) 8.0(L)  Total Protein 6.5 - 8.1 g/dL 6.2(I) - -  Total Bilirubin 0.3 - 1.2 mg/dL 0.3 - -  Alkaline Phos 38 - 126 U/L 66 - -  AST 15 - 41 U/L 9(L) - -  ALT 0 - 44 U/L <6 - -   CBC Latest Ref Rng & Units 07/30/2020 06/25/2020 06/23/2020  WBC 4.0 - 10.5 K/uL 10.1 9.3 8.4  Hemoglobin 12.0 - 15.0 g/dL 10.0(L) 9.5(L) 6.9(LL)  Hematocrit 36.0 - 46.0 % 32.7(L) 31.7(L) 24.1(L)  Platelets 150 - 400 K/uL 262 311 309   Lipid Panel     Component Value Date/Time   CHOL 142 07/31/2018 0655   TRIG 72 07/31/2018 0655   HDL 45 07/31/2018 0655   CHOLHDL 3.2 07/31/2018 0655   VLDL 14 07/31/2018 0655   LDLCALC 83 07/31/2018 0655   HEMOGLOBIN A1C Lab Results  Component Value Date   HGBA1C 5.0 07/31/2018   MPG 96.8 07/31/2018   TSH Recent Labs    03/21/20 2329  TSH 5.095*   BNP (last 3 results) Recent Labs    03/20/20 1043 03/21/20 2116  BNP 874.3* 1,091.1*    External labs:    Lipid Panel completed 04/09/2019 HDL 38 MG/DL 2/97/9892 LDL 11.941 mg 04/09/2019 Cholesterol, total 143.000 m 04/09/2019 Triglycerides 127.000 04/09/2019  A1C 5.000 07/31/2018  Glucose Random 110.000 m 11/04/2019 MicroAlbumin Urine 9.000 04/25/2019 MicroAlbumin/Creat 16.8 MG/ 04/25/2019 BUN 18.000 mg 11/04/2019 Creatinine, Serum 0.700 mg/ 11/04/2019  TSH 2.620 micr 11/04/2019  Medications and allergies   Allergies  Allergen Reactions  . Codeine Nausea Only  . Epinephrine     Tachcardyia, chest pains tremors  . Tenormin [Atenolol] Rash    Outpatient Medications Prior to Visit  Medication Sig Dispense Refill  . acetaminophen (TYLENOL) 500 MG tablet Take 1,000 mg by mouth 3 (three) times daily as needed for moderate pain.     . furosemide (LASIX) 40 MG tablet Take 1 tablet (40 mg total) by mouth as directed. Every 3 days and daily  if leg swells 30 tablet   . levothyroxine (SYNTHROID, LEVOTHROID) 112 MCG tablet Take 112-168 mcg by mouth daily before breakfast. Take 1 tablet (112 mcg) on Mondays, Tuesdays, Wednesdays, Thursdays, Fridays & Saturdays. Take 1.5 tablet (168 mcg) on Sundays    . nitrofurantoin, macrocrystal-monohydrate, (MACROBID) 100 MG capsule Take 100 mg by mouth 2 (two) times daily.    . pantoprazole (PROTONIX) 40 MG tablet Take 40 mg by mouth daily as needed (indigestion/heartburn.).     Marland Kitchen potassium chloride (KLOR-CON) 10 MEQ tablet Take 1 tablet (10 mEq total) by mouth 2 (two) times daily as needed. With Furosemide only 60 tablet 1  . amiodarone (PACERONE) 200 MG tablet Take 1 tablet (200 mg total) by mouth daily. 90 tablet 1  . losartan (COZAAR) 25 MG tablet TAKE 1 TABLET BY MOUTH AT BEDTIME 30 tablet 2  .  metoprolol succinate (TOPROL-XL) 25 MG 24 hr tablet TAKE 1 TABLET BY MOUTH EVERY DAY 30 tablet 2   No facility-administered medications prior to visit.    Radiology:   No results found.  Cardiac Studies:   Lexiscan Myoview stress test  04/23/2018: 1. The  resting electrocardiogram demonstrated normal sinus rhythm, normal resting conduction and no resting arrhythmias.  Stress EKG is non-diagnostic for ischemia as it a pharmacologic stress using Lexiscan. Occasional PAC and PVC. The patient developed significant symptoms which included Chest Pain, Stomach pain, Headache.  2. The overall quality of the study is good. There is no evidence of abnormal lung activity. Stress and rest SPECT images demonstrate homogeneous tracer distribution throughout the myocardium. Gated SPECT imaging reveals diffuse global hypokinesis. The left ventricular ejection fraction was markedly depressed at (26%).   3. This is a high risk study due to severe LV systolic dysfunction. Findings may represent non ischemic cardiomyopathy.  Echocardiogram 07/23/2020: Left ventricle cavity is normal in size and wall thickness. Severe global hypokinesis with visual LVEF 20-25%.  Doppler evidence of grade II (pseudonormal) diastolic dysfunction, elevated LAP.  The aortic root is mildly dilated at 3.9 cm. Left atrial cavity is moderately dilated. Trileaflet aortic valve.  Moderate (Grade III) aortic regurgitation. Structurally normal mitral valve.  Moderate (Grade III) mitral regurgitation. Mild tricuspid regurgitation.  No evidence of pulmonary hypertension. Compared to previous study on 03/10/2021, LVEF is marginally improved from 15-20%. Mild pulmonary hypertension is now absent.     EKG    EKG 07/14/2020: Normal sinus rhythm with rate of 77 bpm, left axis deviation, left intrafascicular block.  Poor R wave progression, cannot exclude anteroseptal infarct old.  Normal QT interval.  Single PAC.  No change from 01/10/2020.  EKG 03/25/2020: Atrial fibrillation with rapid ventricular response at the rate of 140 bpm, left axis deviation, left intrafascicular block.  Poor R wave progression, cannot exclude anteroseptal infarct old.  Nonspecific ST-T abnormality, cannot exclude lateral  ischemia.   Assessment     ICD-10-CM   1. Chronic systolic heart failure (HCC)  I50.22 losartan (COZAAR) 50 MG tablet    metoprolol succinate (TOPROL-XL) 50 MG 24 hr tablet  2. Orthostatic hypotension  I95.1   3. Supine hypertension  I10   4. Nonischemic cardiomyopathy (HCC)  I42.8   5. Paroxysmal atrial fibrillation (HCC)  I48.0 amiodarone (PACERONE) 200 MG tablet     CHA2DS2-VASc Score is 5.  Yearly risk of stroke: 7.2% (A, F, HTN, CHF).  Score of 1=0.6; 2=2.2; 3=3.2; 4=4.8; 5=7.2; 6=9.8; 7=>9.8) -(CHF; HTN; vasc disease DM,  Female = 1; Age <65 =0; 65-74 = 1,  >75 =2; stroke/embolism= 2).   Meds ordered this encounter  Medications  . losartan (COZAAR) 50 MG tablet    Sig: Take 1 tablet (50 mg total) by mouth at bedtime.    Dispense:  90 tablet    Refill:  1  . metoprolol succinate (TOPROL-XL) 50 MG 24 hr tablet    Sig: Take 1 tablet (50 mg total) by mouth daily.    Dispense:  90 tablet    Refill:  1  . amiodarone (PACERONE) 200 MG tablet    Sig: Take 0.5 tablets (100 mg total) by mouth daily.    Dispense:  90 tablet    Refill:  1    Medications Discontinued During This Encounter  Medication Reason  . losartan (COZAAR) 25 MG tablet Reorder  . metoprolol succinate (TOPROL-XL) 25 MG 24 hr tablet Reorder  . amiodarone (  PACERONE) 200 MG tablet Reorder     Recommendations:   Heidi Adams  is a 85 y.o. female  with bilateral hip replacement and knee replacement and left breast lumpectomy in 2018 without chemotherapy or radiation therapy for breast cancer and also remote hysterectomy due to uterine cancer in 1991, supine hypertension and orthostatic hypotension, paroxysmal atrial fibrillation on chronic Eliquis, chronic dizziness on standing. Has not been able to tolerate any BP medications due to severe dizzy spells.  Admitted to the hospital on 03/21/2020 and discharged on 03/27/2020 with E. coli urosepsis and bacteremia, on atrial fibrillation with RVR, S/P direct-current  cardioversion to sinus rhythm, acute decompensated heart failure.  Also received 2 units of packed RBCs due to GI bleed.  She also received iron infusion. Hence now not on Eliquis.  She is here on a 6-week office visit, finally tolerating metoprolol succinate and losartan.  She did not tolerate any other guideline directed medical therapy stating that she is severely fatigued and dizzy.  She is willing to try increased dose of metoprolol and losartan at 50 mg daily.  She is maintaining sinus rhythm with regard to atrial fibrillation, will reduce amiodarone to 100 mg daily.  I reviewed her echocardiogram, no significant change in LVEF.  Today she does not appear to be in any acute decompensated heart failure, she has mild chronic leg edema, no JVD, except for faint bibasilar crackles at the bases that improved on coughing, no other significant abnormality.  I would like to see her back in 6 weeks for close monitoring.    Adrian Prows, MD, [employer] 08/05/2020, 3:14 PM Office: [phone]

## 2020-08-05 NOTE — Patient Instructions (Signed)
Please increase the dose of losartan from 25mg  to 50 mg.  You can take 2 of the tablets that you have at home.  Please increase the dose of metoprolol succinate from 25mg  to 50 mg.  You can take 2 tablets of the same that you have at home.  Once you are done with the prescriptions at home, I have sent in 50 mg prescriptions to the pharmacy which you can pick up.  I have reduced the dose of the amiodarone from 200 mg 1 pill once a day to 1/2 tablet once a day.  I will see her back in 6 weeks.

## 2020-08-11 ENCOUNTER — Ambulatory Visit: Payer: PPO | Admitting: Cardiology

## 2020-08-13 ENCOUNTER — Encounter: Payer: Self-pay | Admitting: Cardiology

## 2020-08-13 ENCOUNTER — Ambulatory Visit: Payer: PPO | Admitting: Cardiology

## 2020-08-13 ENCOUNTER — Other Ambulatory Visit: Payer: Self-pay

## 2020-08-13 VITALS — BP 181/81 | HR 65 | Temp 97.0°F | Resp 16 | Ht 64.0 in | Wt 128.3 lb

## 2020-08-13 DIAGNOSIS — I951 Orthostatic hypotension: Secondary | ICD-10-CM | POA: Diagnosis not present

## 2020-08-13 DIAGNOSIS — I10 Essential (primary) hypertension: Secondary | ICD-10-CM | POA: Diagnosis not present

## 2020-08-13 DIAGNOSIS — I480 Paroxysmal atrial fibrillation: Secondary | ICD-10-CM | POA: Diagnosis not present

## 2020-08-13 DIAGNOSIS — I5023 Acute on chronic systolic (congestive) heart failure: Secondary | ICD-10-CM | POA: Diagnosis not present

## 2020-08-13 MED ORDER — FUROSEMIDE 40 MG PO TABS: 40.0000 mg | ORAL_TABLET | ORAL | 1 refills | Status: DC

## 2020-08-13 NOTE — Progress Notes (Signed)
Primary Physician/Referring:  [name], MD  Patient ID: Heidi Adams, female    DOB: 07/19/1936, 85 y.o.   MRN: [id number]  Chief Complaint  Patient presents with  . Shortness of Breath   HPI:    Heidi Adams  is a 85 y.o. female  with bilateral hip replacement and knee replacement and left breast lumpectomy in 2018 without chemotherapy or radiation therapy for breast cancer and also remote hysterectomy due to uterine cancer in 1991, supine hypertension and orthostatic hypotension, paroxysmal atrial fibrillation on chronic Eliquis, chronic dizziness on standing. Has not been able to tolerate any BP medications due to severe dizzy spells.  Admitted to the hospital on 03/21/2020 and discharged on 03/27/2020 with E. coli urosepsis and bacteremia, on atrial fibrillation with RVR, S/P direct-current cardioversion to sinus rhythm, acute decompensated heart failure.  Also received 2 units of packed RBCs due to GI bleed.  She also received iron infusion. She is now off of anticoagulation.  I had seen her 1 week ago and she had been doing well, called [place] this morning stating that she is extremely short of breath and would like to be seen urgently.  Past Medical History:  Diagnosis Date  . Allergy   . Anemia    hx of anemia  . Arthritis   . Breast cancer ([place])    Left breast  . Cancer ([place])    skin ca nose  . Chronic combined systolic and diastolic CHF (congestive heart failure) ([place]) 09/22/2018  . Chronic combined systolic and diastolic CHF (congestive heart failure) ([place]) 09/22/2018  . Chronic combined systolic and diastolic CHF (congestive heart failure) ([place]) 09/22/2018  . Dysrhythmia    went into Atrial Fibrillation after last surgery, HAS PALPITATIONS  . History of skin cancer   . Hypertension   . Hypothyroidism   . PVC (premature ventricular contraction)   . Thyroiditis 1973   Past Surgical History:  Procedure Laterality Date  . ABDOMINAL HYSTERECTOMY  90   TAH BSO  .  APPENDECTOMY    . BREAST LUMPECTOMY Left 05/30/2017  . BREAST LUMPECTOMY WITH RADIOACTIVE SEED LOCALIZATION Left 05/30/2017   Procedure: LEFT BREAST LUMPECTOMY WITH RADIOACTIVE SEED LOCALIZATION;  Surgeon: Excell Seltzer, MD;  Location: [facility];  Service: General;  Laterality: Left;  . CATARACT EXTRACTION  09/10/2018   Right eye  . CHOLECYSTECTOMY     dr Georgia Lopes  . ELBOW SURGERY    . EYE SURGERY     "on my eyelids" years ago  . KNEE ARTHROSCOPY     left  . THYROIDECTOMY  1973  . TONSILLECTOMY    . TOTAL HIP ARTHROPLASTY Right 05/10/2016   Procedure: TOTAL HIP ARTHROPLASTY ANTERIOR APPROACH;  Surgeon: Paralee Cancel, MD;  Location: [facility];  Service: Orthopedics;  Laterality: Right;  . TOTAL HIP ARTHROPLASTY Left 01/10/2017   Procedure: LEFT TOTAL HIP ARTHROPLASTY ANTERIOR APPROACH;  Surgeon: Paralee Cancel, MD;  Location: [facility];  Service: Orthopedics;  Laterality: Left;  . TOTAL KNEE ARTHROPLASTY Left 09/28/2015   Procedure: LEFT TOTAL KNEE ARTHROPLASTY;  Surgeon: Paralee Cancel, MD;  Location: [facility];  Service: Orthopedics;  Laterality: Left;  . TOTAL KNEE ARTHROPLASTY Right 08/09/2016   Procedure: RIGHT TOTAL KNEE ARTHROPLASTY;  Surgeon: Paralee Cancel, MD;  Location: [facility];  Service: Orthopedics;  Laterality: Right;  Adductor Block  . Total Left hip arthroplasty     01/10/17 Dr. Alvan Dame   Family History  Problem Relation Age of Onset  . Diabetes Father   .  Hypertension Father   . Stroke Father   . Breast cancer Cousin   . Pneumonia Sister     Social History   Tobacco Use  . Smoking status: Former Smoker    Packs/day: 1.00    Years: 20.00    Pack years: 20.00    Types: Cigarettes    Quit date: 07/26/1987    Years since quitting: 33.0  . Smokeless tobacco: Never Used  Substance Use Topics  . Alcohol use: No    Alcohol/week: 0.0 standard drinks   Marital Status: Married  ROS  Review of Systems  Constitutional: Positive for malaise/fatigue. Negative for  diaphoresis and weight gain.  Cardiovascular: Positive for dyspnea on exertion (mild), leg swelling (intermittent, stable and mild) and orthopnea. Negative for chest pain, palpitations and syncope.  Respiratory: Negative for cough.   Musculoskeletal: Positive for arthritis and joint pain. Negative for joint swelling.  Gastrointestinal: Negative for nausea and vomiting.  Neurological: Positive for loss of balance. Negative for light-headedness.   Objective  Blood pressure (!) 181/81, pulse 65, temperature (!) 97 F (36.1 C), resp. rate 16, height 5\' 4"  (1.626 m), weight 128 lb 4.8 oz (58.2 kg), last menstrual period 07/25/1988, SpO2 94 %.  Vitals with BMI 08/13/2020 08/13/2020 08/05/2020  Height - 5\' 4"  5\' 4"   Weight - 128 lbs 5 oz 130 lbs 13 oz  BMI - [id number]  Systolic 0000000 [id number]  Diastolic 81 96 80  Pulse 65 62 63    Orthostatic VS for the past 72 hrs (Last 3 readings):  Patient Position BP Location Cuff Size  08/13/20 1244 Sitting Left Arm Normal  08/13/20 1242 Sitting Left Arm Normal     Physical Exam Constitutional:      General: She is not in acute distress.    Appearance: She is ill-appearing.     Comments: Moderately build, frail  Cardiovascular:     Rate and Rhythm: Normal rate and regular rhythm.     Pulses: Intact distal pulses.          Radial pulses are 2+ on the right side and 2+ on the left side.       Dorsalis pedis pulses are 1+ on the right side and 1+ on the left side.       Posterior tibial pulses are 1+ on the right side and 1+ on the left side.     Heart sounds: S1 normal and S2 normal. No murmur heard. No gallop.      Comments: Elevated  JVD. Trace pedal edema and 1-2+ ankle edema pitting. Pulmonary:     Effort: No accessory muscle usage or respiratory distress.     Breath sounds: Examination of the right-lower field reveals rhonchi. Examination of the left-lower field reveals rhonchi. Rhonchi (bibasilar coarse crackles, right worse, 1/3rd way up)  present.    Laboratory examination:   Recent Labs    03/25/20 0452 03/26/20 0413 03/27/20 1458 06/23/20 1100  NA 137 139 139 141  K 3.7 3.7 3.7 4.0  CL 101 103 104 107  CO2 26 26 26 26   GLUCOSE 112* 93 100* 88  BUN 30* 25* 17 22  CREATININE 1.25* 1.25* 0.99 0.84  CALCIUM 8.2* 8.0* 7.9* 8.7*  GFRNONAA 40* 40* 53* >60  GFRAA 47* 47* >60  --    CrCl cannot be calculated (Patient's most recent lab result is older than the maximum 21 days allowed.).  CMP Latest Ref Rng & Units 06/23/2020 03/27/2020 03/26/2020  Glucose 70 -  99 mg/dL 88 100(H) 93  BUN 8 - 23 mg/dL 22 17 25(H)  Creatinine 0.44 - 1.00 mg/dL 0.84 0.99 1.25(H)  Sodium 135 - 145 mmol/L 141 139 139  Potassium 3.5 - 5.1 mmol/L 4.0 3.7 3.7  Chloride 98 - 111 mmol/L 107 104 103  CO2 22 - 32 mmol/L 26 26 26   Calcium 8.9 - 10.3 mg/dL 8.7(L) 7.9(L) 8.0(L)  Total Protein 6.5 - 8.1 g/dL 5.7(L) - -  Total Bilirubin 0.3 - 1.2 mg/dL 0.3 - -  Alkaline Phos 38 - 126 U/L 66 - -  AST 15 - 41 U/L 9(L) - -  ALT 0 - 44 U/L <6 - -   CBC Latest Ref Rng & Units 07/30/2020 06/25/2020 06/23/2020  WBC 4.0 - 10.5 K/uL 10.1 9.3 8.4  Hemoglobin 12.0 - 15.0 g/dL 10.0(L) 9.5(L) 6.9(LL)  Hematocrit 36.0 - 46.0 % 32.7(L) 31.7(L) 24.1(L)  Platelets 150 - 400 K/uL 262 311 309   Lipid Panel     Component Value Date/Time   CHOL 142 07/31/2018 0655   TRIG 72 07/31/2018 0655   HDL 45 07/31/2018 0655   CHOLHDL 3.2 07/31/2018 0655   VLDL 14 07/31/2018 0655   LDLCALC 83 07/31/2018 0655   HEMOGLOBIN A1C Lab Results  Component Value Date   HGBA1C 5.0 07/31/2018   MPG 96.8 07/31/2018   TSH Recent Labs    03/21/20 2329  TSH 5.095*   BNP (last 3 results) Recent Labs    03/20/20 1043 03/21/20 2116  BNP 874.3* 1,091.1*    External labs:   Lipid Panel completed 04/09/2019 HDL 38 MG/DL 04/09/2019 LDL 80.000 mg 04/09/2019 Cholesterol, total 143.000 m 04/09/2019 Triglycerides 127.000 04/09/2019  A1C 5.000 07/31/2018  Glucose Random 110.000 m  11/04/2019 MicroAlbumin Urine 9.000 04/25/2019 MicroAlbumin/Creat 16.8 MG/ 04/25/2019 BUN 18.000 mg 11/04/2019 Creatinine, Serum 0.700 mg/ 11/04/2019  TSH 2.620 micr 11/04/2019  Medications and allergies   Allergies  Allergen Reactions  . Codeine Nausea Only  . Epinephrine     Tachcardyia, chest pains tremors  . Tenormin [Atenolol] Rash    Outpatient Medications Prior to Visit  Medication Sig Dispense Refill  . acetaminophen (TYLENOL) 500 MG tablet Take 1,000 mg by mouth 3 (three) times daily as needed for moderate pain.     Marland Kitchen amiodarone (PACERONE) 200 MG tablet Take 0.5 tablets (100 mg total) by mouth daily. 90 tablet 1  . levothyroxine (SYNTHROID, LEVOTHROID) 112 MCG tablet Take 112-168 mcg by mouth daily before breakfast. Take 1 tablet (112 mcg) on Mondays, Tuesdays, Wednesdays, Thursdays, Fridays & Saturdays. Take 1.5 tablet (168 mcg) on Sundays    . losartan (COZAAR) 50 MG tablet Take 1 tablet (50 mg total) by mouth at bedtime. 90 tablet 1  . metoprolol succinate (TOPROL-XL) 50 MG 24 hr tablet Take 1 tablet (50 mg total) by mouth daily. 90 tablet 1  . nitrofurantoin, macrocrystal-monohydrate, (MACROBID) 100 MG capsule Take 100 mg by mouth 2 (two) times daily.    . pantoprazole (PROTONIX) 40 MG tablet Take 40 mg by mouth daily as needed (indigestion/heartburn.).     Marland Kitchen potassium chloride (KLOR-CON) 10 MEQ tablet Take 1 tablet (10 mEq total) by mouth 2 (two) times daily as needed. With Furosemide only 60 tablet 1  . furosemide (LASIX) 40 MG tablet Take 1 tablet (40 mg total) by mouth as directed. Every 3 days and daily  if leg swells 30 tablet    No facility-administered medications prior to visit.    Radiology:   No results  found.  Cardiac Studies:   Lexiscan Myoview stress test  04/23/2018: 1. The resting electrocardiogram demonstrated normal sinus rhythm, normal resting conduction and no resting arrhythmias.  Stress EKG is non-diagnostic for ischemia as it a pharmacologic  stress using Lexiscan. Occasional PAC and PVC. The patient developed significant symptoms which included Chest Pain, Stomach pain, Headache.  2. The overall quality of the study is good. There is no evidence of abnormal lung activity. Stress and rest SPECT images demonstrate homogeneous tracer distribution throughout the myocardium. Gated SPECT imaging reveals diffuse global hypokinesis. The left ventricular ejection fraction was markedly depressed at (26%).   3. This is a high risk study due to severe LV systolic dysfunction. Findings may represent non ischemic cardiomyopathy.  Echocardiogram 07/23/2020: Left ventricle cavity is normal in size and wall thickness. Severe global hypokinesis with visual LVEF 20-25%.  Doppler evidence of grade II (pseudonormal) diastolic dysfunction, elevated LAP.  The aortic root is mildly dilated at 3.9 cm. Left atrial cavity is moderately dilated. Trileaflet aortic valve.  Moderate (Grade III) aortic regurgitation. Structurally normal mitral valve.  Moderate (Grade III) mitral regurgitation. Mild tricuspid regurgitation.  No evidence of pulmonary hypertension. Compared to previous study on 03/10/2021, LVEF is marginally improved from 15-20%. Mild pulmonary hypertension is now absent.     EKG    EKG 07/14/2020: Normal sinus rhythm with rate of 77 bpm, left axis deviation, left intrafascicular block.  Poor R wave progression, cannot exclude anteroseptal infarct old.  Normal QT interval.  Single PAC.  No change from 01/10/2020.  EKG 03/25/2020: Atrial fibrillation with rapid ventricular response at the rate of 140 bpm, left axis deviation, left intrafascicular block.  Poor R wave progression, cannot exclude anteroseptal infarct old.  Nonspecific ST-T abnormality, cannot exclude lateral ischemia.   Assessment     ICD-10-CM   1. Acute on chronic systolic heart failure (HCC)  I50.23 furosemide (LASIX) 40 MG tablet    CBC    Basic metabolic panel    Brain  natriuretic peptide  2. Orthostatic hypotension  I95.1   3. Supine hypertension  I10   4. AF (paroxysmal atrial fibrillation) (HCC)  I48.0      CHA2DS2-VASc Score is 5.  Yearly risk of stroke: 7.2% (A, F, HTN, CHF).  Score of 1=0.6; 2=2.2; 3=3.2; 4=4.8; 5=7.2; 6=9.8; 7=>9.8) -(CHF; HTN; vasc disease DM,  Female = 1; Age <65 =0; 65-74 = 1,  >75 =2; stroke/embolism= 2).   Meds ordered this encounter  Medications  . furosemide (LASIX) 40 MG tablet    Sig: Take 1 tablet (40 mg total) by mouth as directed. Daily. Take twice daily starting 08/13/20 through 08/17/20    Dispense:  90 tablet    Refill:  1    Medications Discontinued During This Encounter  Medication Reason  . furosemide (LASIX) 40 MG tablet     Orders Placed This Encounter  Procedures  . CBC  . Basic metabolic panel  . Brain natriuretic peptide     Recommendations:   Heidi Adams  is a 85 y.o. female  with bilateral hip replacement and knee replacement and left breast lumpectomy in 2018 without chemotherapy or radiation therapy for breast cancer and also remote hysterectomy due to uterine cancer in 1991, supine hypertension and orthostatic hypotension, paroxysmal atrial fibrillation on chronic Eliquis, chronic dizziness on standing. Has not been able to tolerate any BP medications due to severe dizzy spells.  Admitted to the hospital on 03/21/2020 and discharged on 03/27/2020 with E. coli  urosepsis and bacteremia, on atrial fibrillation with RVR, S/P direct-current cardioversion to sinus rhythm, acute decompensated heart failure.  Also received 2 units of packed RBCs due to GI bleed.  She also received iron infusion. Hence now not on Eliquis.  I had seen her 1 week ago and she had been doing well, called [place] this morning stating that she is extremely short of breath and would like to be seen urgently.  Today she has very prominent bibasilar crackles, elevated JVD, trace leg edema suggestive of acute decompensated heart  failure.  She has responded well to furosemide, I doubled up on her dose this morning.  She is already lost 2 pounds in weight since morning.  Advised her to increase Lasix to 40 mg twice daily for the next 3 days, will obtain a BMP, CBC and BNP today as a baseline and also follow-up on her anemia and renal function as well.  From now on advised her that she should continue with furosemide on a daily basis instead of taking it on a as needed basis.  I would like to see her back in 2 weeks for follow-up. Her major issue has been supine hypertension and orthostatic hypotension but more importantly she has got multiple medication intolerances.  Fortunately tolerating metoprolol and losartan and last week had increased the dose from 25 mg of metoprolol succinate to 50 mg and losartan from 25 mg to 50 mg daily and will continue the same for now.  Will enroll the patient in Remote Patient Monitoring and Principal Care Management as patient is high risk for hospitalization and complications from underlying medical conditions.    Adrian Prows, MD, [facility] 08/13/2020, 2:10 PM Office: [phone]

## 2020-08-17 ENCOUNTER — Telehealth: Payer: Self-pay

## 2020-08-17 DIAGNOSIS — I5023 Acute on chronic systolic (congestive) heart failure: Secondary | ICD-10-CM | POA: Diagnosis not present

## 2020-08-17 NOTE — Telephone Encounter (Signed)
That is fine 

## 2020-08-19 DIAGNOSIS — U071 COVID-19: Secondary | ICD-10-CM | POA: Diagnosis not present

## 2020-08-19 DIAGNOSIS — Z20822 Contact with and (suspected) exposure to covid-19: Secondary | ICD-10-CM | POA: Diagnosis not present

## 2020-08-19 LAB — BASIC METABOLIC PANEL
BUN/Creatinine Ratio: 18 (ref 12–28)
BUN: 28 mg/dL — ABNORMAL HIGH (ref 8–27)
CO2: 28 mmol/L (ref 20–29)
Calcium: 9.1 mg/dL (ref 8.7–10.3)
Chloride: 96 mmol/L (ref 96–106)
Creatinine, Ser: 1.56 mg/dL — ABNORMAL HIGH (ref 0.57–1.00)
GFR calc Af Amer: 35 mL/min/{1.73_m2} — ABNORMAL LOW (ref >59)
GFR calc non Af Amer: 31 mL/min/{1.73_m2} — ABNORMAL LOW (ref >59)
Glucose: 89 mg/dL (ref 65–99)
Potassium: 3.4 mmol/L — ABNORMAL LOW (ref 3.5–5.2)
Sodium: 139 mmol/L (ref 134–144)

## 2020-08-19 LAB — BRAIN NATRIURETIC PEPTIDE: BNP: 327.1 pg/mL — ABNORMAL HIGH (ref 0.0–100.0)

## 2020-08-19 LAB — CBC
Hematocrit: 36.9 % (ref 34.0–46.6)
Hemoglobin: 11.4 g/dL (ref 11.1–15.9)
MCH: 25.4 pg — ABNORMAL LOW (ref 26.6–33.0)
MCHC: 30.9 g/dL — ABNORMAL LOW (ref 31.5–35.7)
MCV: 82 fL (ref 79–97)
Platelets: 222 10*3/uL (ref 150–450)
RBC: 4.48 x10E6/uL (ref 3.77–5.28)
RDW: 18.6 % — ABNORMAL HIGH (ref 11.7–15.4)
WBC: 6.4 10*3/uL (ref 3.4–10.8)

## 2020-08-19 NOTE — Addendum Note (Signed)
Addended by: Kela Millin on: 08/19/2020 05:20 PM   Modules accepted: Orders

## 2020-08-20 NOTE — Progress Notes (Signed)
Called and spoke with patient. She has reduced her Lasix dosing to as needed. Reports SOB and leg swelling have resolved. She is aware to take potassium supplement with Lasix and to continue with as needed Lasix. Patient will go to [facility] for repeat BMP and BNP in 2 weeks.

## 2020-08-23 DIAGNOSIS — J1282 Pneumonia due to coronavirus disease 2019: Secondary | ICD-10-CM | POA: Diagnosis not present

## 2020-08-23 DIAGNOSIS — U071 COVID-19: Secondary | ICD-10-CM | POA: Diagnosis not present

## 2020-08-24 DIAGNOSIS — I110 Hypertensive heart disease with heart failure: Secondary | ICD-10-CM | POA: Diagnosis not present

## 2020-08-27 ENCOUNTER — Ambulatory Visit: Payer: PPO | Admitting: Cardiology

## 2020-08-28 DIAGNOSIS — I5032 Chronic diastolic (congestive) heart failure: Secondary | ICD-10-CM | POA: Diagnosis not present

## 2020-08-28 DIAGNOSIS — I480 Paroxysmal atrial fibrillation: Secondary | ICD-10-CM | POA: Diagnosis not present

## 2020-08-28 DIAGNOSIS — I10 Essential (primary) hypertension: Secondary | ICD-10-CM | POA: Diagnosis not present

## 2020-08-29 DIAGNOSIS — J189 Pneumonia, unspecified organism: Secondary | ICD-10-CM | POA: Diagnosis not present

## 2020-08-29 DIAGNOSIS — U071 COVID-19: Secondary | ICD-10-CM | POA: Diagnosis not present

## 2020-09-09 ENCOUNTER — Other Ambulatory Visit: Payer: Self-pay

## 2020-09-09 ENCOUNTER — Ambulatory Visit: Payer: PPO | Admitting: Cardiology

## 2020-09-09 ENCOUNTER — Encounter: Payer: Self-pay | Admitting: Cardiology

## 2020-09-09 VITALS — BP 160/84 | HR 84 | Temp 97.2°F | Resp 16 | Ht 64.0 in | Wt 129.8 lb

## 2020-09-09 DIAGNOSIS — I5022 Chronic systolic (congestive) heart failure: Secondary | ICD-10-CM | POA: Diagnosis not present

## 2020-09-09 DIAGNOSIS — I5023 Acute on chronic systolic (congestive) heart failure: Secondary | ICD-10-CM | POA: Diagnosis not present

## 2020-09-09 DIAGNOSIS — I951 Orthostatic hypotension: Secondary | ICD-10-CM | POA: Diagnosis not present

## 2020-09-09 DIAGNOSIS — I480 Paroxysmal atrial fibrillation: Secondary | ICD-10-CM | POA: Diagnosis not present

## 2020-09-09 DIAGNOSIS — I10 Essential (primary) hypertension: Secondary | ICD-10-CM

## 2020-09-09 NOTE — Progress Notes (Signed)
Primary Physician/Referring:  [name], MD  Patient ID: Heidi Adams, female    DOB: 1936/02/21, 85 y.o.   MRN: [id number]  Chief Complaint  Patient presents with  . Congestive Heart Failure  . Hypertension  . Follow-up    2 week   HPI:    Heidi Adams  is a 85 y.o. female  with bilateral hip replacement and knee replacement and left breast lumpectomy in 2018 without chemotherapy or radiation therapy for breast cancer and also remote hysterectomy due to uterine cancer in 1991, supine hypertension and orthostatic hypotension, paroxysmal atrial fibrillation on chronic Eliquis, chronic dizziness on standing. Has not been able to tolerate any BP medications due to severe dizzy spells.  Admitted to the hospital on 03/21/2020 and discharged on 03/27/2020 with E. coli urosepsis and bacteremia, on atrial fibrillation with RVR, S/P direct-current cardioversion to sinus rhythm, acute decompensated heart failure.  Also received 2 units of packed RBCs due to GI bleed.  She also received iron infusion. She is now off of anticoagulation.  I had seen her 4 weeks ago and she had been doing well, her appetite is improved, she has gained about 2 pounds in weight, no PND or orthopnea, no worsening leg edema.  Also states that she had Covid infection last month when she had called [place] for evaluation of dyspnea.  Past Medical History:  Diagnosis Date  . Allergy   . Anemia    hx of anemia  . Arthritis   . Breast cancer ([place])    Left breast  . Cancer ([place])    skin ca nose  . Chronic combined systolic and diastolic CHF (congestive heart failure) ([place]) 09/22/2018  . Chronic combined systolic and diastolic CHF (congestive heart failure) ([place]) 09/22/2018  . Chronic combined systolic and diastolic CHF (congestive heart failure) ([place]) 09/22/2018  . Dysrhythmia    went into Atrial Fibrillation after last surgery, HAS PALPITATIONS  . History of skin cancer   . Hypertension   . Hypothyroidism   . PVC (premature  ventricular contraction)   . Thyroiditis 1973   Past Surgical History:  Procedure Laterality Date  . ABDOMINAL HYSTERECTOMY  90   TAH BSO  . APPENDECTOMY    . BREAST LUMPECTOMY Left 05/30/2017  . BREAST LUMPECTOMY WITH RADIOACTIVE SEED LOCALIZATION Left 05/30/2017   Procedure: LEFT BREAST LUMPECTOMY WITH RADIOACTIVE SEED LOCALIZATION;  Surgeon: Excell Seltzer, MD;  Location: [facility];  Service: General;  Laterality: Left;  . CATARACT EXTRACTION  09/10/2018   Right eye  . CHOLECYSTECTOMY     dr Georgia Lopes  . ELBOW SURGERY    . EYE SURGERY     "on my eyelids" years ago  . KNEE ARTHROSCOPY     left  . THYROIDECTOMY  1973  . TONSILLECTOMY    . TOTAL HIP ARTHROPLASTY Right 05/10/2016   Procedure: TOTAL HIP ARTHROPLASTY ANTERIOR APPROACH;  Surgeon: Paralee Cancel, MD;  Location: [facility];  Service: Orthopedics;  Laterality: Right;  . TOTAL HIP ARTHROPLASTY Left 01/10/2017   Procedure: LEFT TOTAL HIP ARTHROPLASTY ANTERIOR APPROACH;  Surgeon: Paralee Cancel, MD;  Location: [facility];  Service: Orthopedics;  Laterality: Left;  . TOTAL KNEE ARTHROPLASTY Left 09/28/2015   Procedure: LEFT TOTAL KNEE ARTHROPLASTY;  Surgeon: Paralee Cancel, MD;  Location: [facility];  Service: Orthopedics;  Laterality: Left;  . TOTAL KNEE ARTHROPLASTY Right 08/09/2016   Procedure: RIGHT TOTAL KNEE ARTHROPLASTY;  Surgeon: Paralee Cancel, MD;  Location: [facility];  Service: Orthopedics;  Laterality:  Right;  Adductor Block  . Total Left hip arthroplasty     01/10/17 Dr. Alvan Dame   Family History  Problem Relation Age of Onset  . Diabetes Father   . Hypertension Father   . Stroke Father   . Breast cancer Cousin   . Pneumonia Sister     Social History   Tobacco Use  . Smoking status: Former Smoker    Packs/day: 1.00    Years: 20.00    Pack years: 20.00    Types: Cigarettes    Quit date: 07/26/1987    Years since quitting: 33.1  . Smokeless tobacco: Never Used  Substance Use Topics  . Alcohol use: No     Alcohol/week: 0.0 standard drinks  Marital Status: Married   ROS  Review of Systems  Constitutional: Positive for malaise/fatigue (Much improved). Negative for diaphoresis and weight gain.  Cardiovascular: Positive for dyspnea on exertion (mild) and leg swelling (intermittent, stable and mild). Negative for chest pain, orthopnea, palpitations and syncope.  Respiratory: Negative for cough.   Musculoskeletal: Positive for arthritis and joint pain. Negative for joint swelling.  Gastrointestinal: Negative for nausea and vomiting.  Neurological: Positive for loss of balance. Negative for light-headedness.   Objective  Blood pressure (!) 160/84, pulse 84, temperature (!) 97.2 F (36.2 C), temperature source Temporal, resp. rate 16, height 5\' 4"  (1.626 m), weight 129 lb 12.8 oz (58.9 kg), last menstrual period 07/25/1988, SpO2 98 %.  Vitals with BMI 09/09/2020 08/13/2020 08/13/2020  Height 5\' 4"  - 5\' 4"   Weight 129 lbs 13 oz - 128 lbs 5 oz  BMI 71.06 - 26.94  Systolic 854 627 035  Diastolic 84 81 96  Pulse 84 65 62    Orthostatic VS for the past 72 hrs (Last 3 readings):  Patient Position BP Location Cuff Size  09/09/20 1150 Sitting Left Arm Normal     Physical Exam Constitutional:      General: She is not in acute distress.    Appearance: She is ill-appearing.     Comments: Moderately build, frail  Cardiovascular:     Rate and Rhythm: Normal rate and regular rhythm.     Pulses: Intact distal pulses.          Radial pulses are 2+ on the right side and 2+ on the left side.       Dorsalis pedis pulses are 1+ on the right side and 1+ on the left side.       Posterior tibial pulses are 1+ on the right side and 1+ on the left side.     Heart sounds: S1 normal and S2 normal. No murmur heard. No gallop.      Comments: Elevated  JVD. Trace pedal edema and 1-2+ ankle edema pitting. Pulmonary:     Effort: No accessory muscle usage or respiratory distress.     Breath sounds: Examination of the  right-lower field reveals rhonchi. Examination of the left-lower field reveals rhonchi. Rhonchi (bibasilar coarse crackles, right worse, at the bases) present.    Laboratory examination:   Recent Labs    03/26/20 0413 03/27/20 1458 06/23/20 1100 08/17/20 1026  NA 139 139 141 139  K 3.7 3.7 4.0 3.4*  CL 103 104 107 96  CO2 26 26 26 28   GLUCOSE 93 100* 88 89  BUN 25* 17 22 28*  CREATININE 1.25* 0.99 0.84 1.56*  CALCIUM 8.0* 7.9* 8.7* 9.1  GFRNONAA 40* 53* >60 31*  GFRAA 47* >60  --  35*  CrCl cannot be calculated (Patient's most recent lab result is older than the maximum 21 days allowed.).  CMP Latest Ref Rng & Units 08/17/2020 06/23/2020 03/27/2020  Glucose 65 - 99 mg/dL 89 88 100(H)  BUN 8 - 27 mg/dL 28(H) 22 17  Creatinine 0.57 - 1.00 mg/dL 1.56(H) 0.84 0.99  Sodium 134 - 144 mmol/L 139 141 139  Potassium 3.5 - 5.2 mmol/L 3.4(L) 4.0 3.7  Chloride 96 - 106 mmol/L 96 107 104  CO2 20 - 29 mmol/L 28 26 26   Calcium 8.7 - 10.3 mg/dL 9.1 8.7(L) 7.9(L)  Total Protein 6.5 - 8.1 g/dL - 5.7(L) -  Total Bilirubin 0.3 - 1.2 mg/dL - 0.3 -  Alkaline Phos 38 - 126 U/L - 66 -  AST 15 - 41 U/L - 9(L) -  ALT 0 - 44 U/L - <6 -   CBC Latest Ref Rng & Units 08/17/2020 07/30/2020 06/25/2020  WBC 3.4 - 10.8 x10E3/uL 6.4 10.1 9.3  Hemoglobin 11.1 - 15.9 g/dL 11.4 10.0(L) 9.5(L)  Hematocrit 34.0 - 46.6 % 36.9 32.7(L) 31.7(L)  Platelets 150 - 450 x10E3/uL 222 262 311   Lipid Panel     Component Value Date/Time   CHOL 142 07/31/2018 0655   TRIG 72 07/31/2018 0655   HDL 45 07/31/2018 0655   CHOLHDL 3.2 07/31/2018 0655   VLDL 14 07/31/2018 0655   LDLCALC 83 07/31/2018 0655   HEMOGLOBIN A1C Lab Results  Component Value Date   HGBA1C 5.0 07/31/2018   MPG 96.8 07/31/2018   TSH Recent Labs    03/21/20 2329  TSH 5.095*   BNP (last 3 results) Recent Labs    03/20/20 1043 03/21/20 2116 08/17/20 1026  BNP 874.3* 1,091.1* 327.1*    External labs:   Lipid Panel completed  04/09/2019 HDL 38 MG/DL 04/09/2019 LDL 80.000 mg 04/09/2019 Cholesterol, total 143.000 m 04/09/2019 Triglycerides 127.000 04/09/2019  A1C 5.000 07/31/2018  Glucose Random 110.000 m 11/04/2019 MicroAlbumin Urine 9.000 04/25/2019 MicroAlbumin/Creat 16.8 MG/ 04/25/2019 BUN 18.000 mg 11/04/2019 Creatinine, Serum 0.700 mg/ 11/04/2019  TSH 2.620 micr 11/04/2019  Medications and allergies   Allergies  Allergen Reactions  . Codeine Nausea Only  . Epinephrine     Tachcardyia, chest pains tremors  . Tenormin [Atenolol] Rash    Outpatient Medications Prior to Visit  Medication Sig Dispense Refill  . acetaminophen (TYLENOL) 500 MG tablet Take 1,000 mg by mouth 3 (three) times daily as needed for moderate pain.     Marland Kitchen amiodarone (PACERONE) 200 MG tablet Take 0.5 tablets (100 mg total) by mouth daily. (Patient taking differently: Take 200 mg by mouth daily.) 90 tablet 1  . furosemide (LASIX) 40 MG tablet Take 1 tablet (40 mg total) by mouth as directed. Daily. Take twice daily starting 08/13/20 through 08/17/20 90 tablet 1  . levothyroxine (SYNTHROID, LEVOTHROID) 112 MCG tablet Take 112-168 mcg by mouth daily before breakfast. Take 1 tablet (112 mcg) on Mondays, Tuesdays, Wednesdays, Thursdays, Fridays & Saturdays. Take 1.5 tablet (168 mcg) on Sundays    . losartan (COZAAR) 25 MG tablet Take 25 mg by mouth daily.    . metoprolol succinate (TOPROL-XL) 50 MG 24 hr tablet Take 1 tablet (50 mg total) by mouth daily. (Patient taking differently: Take 25 mg by mouth daily.) 90 tablet 1  . pantoprazole (PROTONIX) 40 MG tablet Take 40 mg by mouth daily as needed (indigestion/heartburn.).     Marland Kitchen potassium chloride (KLOR-CON) 10 MEQ tablet Take 1 tablet (10 mEq total) by mouth 2 (  two) times daily as needed. With Furosemide only 60 tablet 1  . losartan (COZAAR) 50 MG tablet Take 1 tablet (50 mg total) by mouth at bedtime. 90 tablet 1  . nitrofurantoin, macrocrystal-monohydrate, (MACROBID) 100 MG capsule Take 100 mg by  mouth 2 (two) times daily.     No facility-administered medications prior to visit.    Radiology:   No results found.  Cardiac Studies:   Lexiscan Myoview stress test  04/23/2018: 1. The resting electrocardiogram demonstrated normal sinus rhythm, normal resting conduction and no resting arrhythmias.  Stress EKG is non-diagnostic for ischemia as it a pharmacologic stress using Lexiscan. Occasional PAC and PVC. The patient developed significant symptoms which included Chest Pain, Stomach pain, Headache.  2. The overall quality of the study is good. There is no evidence of abnormal lung activity. Stress and rest SPECT images demonstrate homogeneous tracer distribution throughout the myocardium. Gated SPECT imaging reveals diffuse global hypokinesis. The left ventricular ejection fraction was markedly depressed at (26%).   3. This is a high risk study due to severe LV systolic dysfunction. Findings may represent non ischemic cardiomyopathy.  Echocardiogram 07/23/2020: Left ventricle cavity is normal in size and wall thickness. Severe global hypokinesis with visual LVEF 20-25%.  Doppler evidence of grade II (pseudonormal) diastolic dysfunction, elevated LAP.  The aortic root is mildly dilated at 3.9 cm. Left atrial cavity is moderately dilated. Trileaflet aortic valve.  Moderate (Grade III) aortic regurgitation. Structurally normal mitral valve.  Moderate (Grade III) mitral regurgitation. Mild tricuspid regurgitation.  No evidence of pulmonary hypertension. Compared to previous study on 03/10/2021, LVEF is marginally improved from 15-20%. Mild pulmonary hypertension is now absent.     EKG    EKG 07/14/2020: Normal sinus rhythm with rate of 77 bpm, left axis deviation, left intrafascicular block.  Poor R wave progression, cannot exclude anteroseptal infarct old.  Normal QT interval.  Single PAC.  No change from 01/10/2020.  EKG 03/25/2020: Atrial fibrillation with rapid ventricular response  at the rate of 140 bpm, left axis deviation, left intrafascicular block.  Poor R wave progression, cannot exclude anteroseptal infarct old.  Nonspecific ST-T abnormality, cannot exclude lateral ischemia.   Assessment     ICD-10-CM   1. Chronic systolic heart failure (HCC)  I50.22 CBC  2. Orthostatic hypotension  I95.1   3. Supine hypertension  I10   4. AF (paroxysmal atrial fibrillation) (HCC)  I48.0      CHA2DS2-VASc Score is 5.  Yearly risk of stroke: 7.2% (A, F, HTN, CHF).  Score of 1=0.6; 2=2.2; 3=3.2; 4=4.8; 5=7.2; 6=9.8; 7=>9.8) -(CHF; HTN; vasc disease DM,  Female = 1; Age <65 =0; 65-74 = 1,  >75 =2; stroke/embolism= 2).   No orders of the defined types were placed in this encounter.   Medications Discontinued During This Encounter  Medication Reason  . losartan (COZAAR) 50 MG tablet Change in therapy  . nitrofurantoin, macrocrystal-monohydrate, (MACROBID) 100 MG capsule Error    Orders Placed This Encounter  Procedures  . CBC     Recommendations:   Heidi Adams  is a 86 y.o. female  with bilateral hip replacement and knee replacement and left breast lumpectomy in 2018 without chemotherapy or radiation therapy for breast cancer and also remote hysterectomy due to uterine cancer in 1991, supine hypertension and orthostatic hypotension, paroxysmal atrial fibrillation on chronic Eliquis, chronic dizziness on standing. Has not been able to tolerate any BP medications due to severe dizzy spells.  Admitted to the hospital on 03/21/2020  and discharged on 03/27/2020 with E. coli urosepsis and bacteremia, on atrial fibrillation with RVR, S/P direct-current cardioversion to sinus rhythm, acute decompensated heart failure.  Also received 2 units of packed RBCs due to GI bleed.  She also received iron infusion. Hence now not on Eliquis.  She was seen a month ago for dyspnea, however now symptoms are resolved, she is feeling the best she has in quite a while and was also diagnosed with Covid  infection at that time.  Appetite is better, she is gained about 2 pounds in weight.  No clinical evidence of heart failure, she was recommended labs on a previous office visit for follow-up of renal insufficiency and to establish baseline BMP which she will obtain today.  I will also repeat CBC.  With regard to atrial fibrillation, rate is well controlled.  Continue amiodarone 200 mg daily.  She is now tolerating losartan at a low dose.  She is not on guideline directed medical therapy as she has multiple medication intolerances.  I will see her back in 2 months for follow-up.  Patient is in Remote Patient Monitoring and Principal Care Management as patient is high risk for hospitalization and complications from underlying medical conditions.    Adrian Prows, MD, [facility] 09/09/2020, 12:43 PM Office: [phone]

## 2020-09-10 LAB — CBC
Hematocrit: 34.5 % (ref 34.0–46.6)
Hemoglobin: 10.7 g/dL — ABNORMAL LOW (ref 11.1–15.9)
MCH: 26.6 pg (ref 26.6–33.0)
MCHC: 31 g/dL — ABNORMAL LOW (ref 31.5–35.7)
MCV: 86 fL (ref 79–97)
Platelets: 213 10*3/uL (ref 150–450)
RBC: 4.02 x10E6/uL (ref 3.77–5.28)
RDW: 19.1 % — ABNORMAL HIGH (ref 11.7–15.4)
WBC: 7 10*3/uL (ref 3.4–10.8)

## 2020-09-10 LAB — BASIC METABOLIC PANEL
BUN/Creatinine Ratio: 28 (ref 12–28)
BUN: 22 mg/dL (ref 8–27)
CO2: 23 mmol/L (ref 20–29)
Calcium: 9.2 mg/dL (ref 8.7–10.3)
Chloride: 104 mmol/L (ref 96–106)
Creatinine, Ser: 0.78 mg/dL (ref 0.57–1.00)
GFR calc Af Amer: 82 mL/min/{1.73_m2} (ref >59)
GFR calc non Af Amer: 71 mL/min/{1.73_m2} (ref >59)
Glucose: 81 mg/dL (ref 65–99)
Potassium: 4.2 mmol/L (ref 3.5–5.2)
Sodium: 143 mmol/L (ref 134–144)

## 2020-09-10 LAB — BRAIN NATRIURETIC PEPTIDE: BNP: 1136.5 pg/mL — ABNORMAL HIGH (ref 0.0–100.0)

## 2020-09-11 NOTE — Progress Notes (Signed)
Patient verbalized understanding and agreement.  

## 2020-09-11 NOTE — Progress Notes (Signed)
Called patient and discussed above recommendations per Dr. [name].  Reviewed lab results and advised her to continue taking Lasix on daily basis patient notes that she is unable to take oral iron due to GI upset.  She is following closely with hematology for management of anemia.  Recommended patient follow-up with PCP regarding further management of anemia.

## 2020-09-24 DIAGNOSIS — I110 Hypertensive heart disease with heart failure: Secondary | ICD-10-CM | POA: Diagnosis not present

## 2020-09-25 DIAGNOSIS — I480 Paroxysmal atrial fibrillation: Secondary | ICD-10-CM | POA: Diagnosis not present

## 2020-09-25 DIAGNOSIS — I10 Essential (primary) hypertension: Secondary | ICD-10-CM | POA: Diagnosis not present

## 2020-09-25 DIAGNOSIS — I5032 Chronic diastolic (congestive) heart failure: Secondary | ICD-10-CM | POA: Diagnosis not present

## 2020-10-01 ENCOUNTER — Ambulatory Visit: Payer: PPO | Admitting: Cardiology

## 2020-10-06 ENCOUNTER — Telehealth: Payer: Self-pay

## 2020-10-06 NOTE — Telephone Encounter (Signed)
Not sure medications.  Sure, I am concerned she may develop heart failure if she stops her medications

## 2020-10-06 NOTE — Telephone Encounter (Signed)
Called and reviewed with pt. Pt reports that she has been having nonspecific itching complains for the past several weeks to months that has been persistent. No recent increase in severity or frequency. Pt has been on her current medication regimens for several months with no noted ADRs. Pt denies any recent changes in laundry detergent, household products, or environmental exposure. Tried benadryl that provided moderate relief, but felt really disoriented the next morning. Has been using moisturizers and creams to help alleviate the itching complain. Recommended pt try claratin or allegra to help with the itching complains to help negative the drowsiness associated with benadryl. Pt to continue monitoring and review symptoms with PCP if they continue to persist. Recommended pt try Aquaphor foor symptom relief as well. Pt has previous history of similar complains that she saw a dermatologist. Pt unwilling to return to the dermatologist at this time, but will consider if the continue to become more severe or bothersome. Pt agreeable to continue her current medication regimens without any noted changes.

## 2020-10-12 ENCOUNTER — Other Ambulatory Visit: Payer: Self-pay

## 2020-10-12 ENCOUNTER — Ambulatory Visit: Payer: PPO | Admitting: Podiatry

## 2020-10-12 ENCOUNTER — Encounter: Payer: Self-pay | Admitting: Podiatry

## 2020-10-12 DIAGNOSIS — K625 Hemorrhage of anus and rectum: Secondary | ICD-10-CM | POA: Insufficient documentation

## 2020-10-12 DIAGNOSIS — M7671 Peroneal tendinitis, right leg: Secondary | ICD-10-CM | POA: Diagnosis not present

## 2020-10-12 DIAGNOSIS — Z8371 Family history of colonic polyps: Secondary | ICD-10-CM | POA: Insufficient documentation

## 2020-10-12 DIAGNOSIS — K2101 Gastro-esophageal reflux disease with esophagitis, with bleeding: Secondary | ICD-10-CM | POA: Insufficient documentation

## 2020-10-12 DIAGNOSIS — K5901 Slow transit constipation: Secondary | ICD-10-CM | POA: Insufficient documentation

## 2020-10-15 ENCOUNTER — Telehealth: Payer: Self-pay | Admitting: *Deleted

## 2020-10-15 NOTE — Telephone Encounter (Signed)
Called and spoke with the patient and stated that I had a tri-lock and patient stated that she would be by but not sure when. Lattie Haw

## 2020-10-19 ENCOUNTER — Encounter: Payer: Self-pay | Admitting: Cardiology

## 2020-10-19 ENCOUNTER — Ambulatory Visit: Payer: PPO | Admitting: Cardiology

## 2020-10-19 ENCOUNTER — Other Ambulatory Visit: Payer: Self-pay

## 2020-10-19 VITALS — BP 181/90 | HR 69 | Temp 97.4°F | Resp 16 | Ht 64.0 in | Wt 127.2 lb

## 2020-10-19 DIAGNOSIS — I5022 Chronic systolic (congestive) heart failure: Secondary | ICD-10-CM | POA: Diagnosis not present

## 2020-10-19 DIAGNOSIS — I428 Other cardiomyopathies: Secondary | ICD-10-CM

## 2020-10-19 DIAGNOSIS — I10 Essential (primary) hypertension: Secondary | ICD-10-CM | POA: Diagnosis not present

## 2020-10-19 DIAGNOSIS — I951 Orthostatic hypotension: Secondary | ICD-10-CM | POA: Diagnosis not present

## 2020-10-19 MED ORDER — POTASSIUM CHLORIDE CRYS ER 10 MEQ PO TBCR: 10.0000 meq | EXTENDED_RELEASE_TABLET | Freq: Every day | ORAL | 1 refills | Status: DC | PRN

## 2020-10-19 NOTE — Progress Notes (Signed)
Primary Physician/Referring:  [name], MD  Patient ID: Heidi Adams, female    DOB: [birth year], 85 y.o.   MRN: [id number]  Chief Complaint  Patient presents with  . Edema   HPI:    Heidi Adams  is a 85 y.o. female  with bilateral hip replacement and knee replacement and left breast lumpectomy in 2018 without chemotherapy or radiation therapy for breast cancer and also remote hysterectomy due to uterine cancer in 1991, supine hypertension and orthostatic hypotension, paroxysmal atrial fibrillation on chronic Eliquis, chronic dizziness on standing. Has not been able to tolerate any BP medications due to severe dizzy spells.  She also has history of A. fib with RVR SP direct-current cardioversion due to acute decompensated heart failure with severely systolic dysfunction on 12/28/628 and also GI bleed at the time.  She is now tolerating Eliquis.  She went appointment to see me as she noticed "rash" on her skin.  Dyspnea is remained stable, no PND or orthopnea, states that her leg edema is essentially resolved.  She is now taking Lasix every third day.  No chest pain or palpitations.  States that her skin rash started after she started metoprolol succinate and wants to see whether she can can discontinue this.  Past Medical History:  Diagnosis Date  . Allergy   . Anemia    hx of anemia  . Arthritis   . Breast cancer ([place])    Left breast  . Cancer ([place])    skin ca nose  . Chronic combined systolic and diastolic CHF (congestive heart failure) ([place]) 09/22/2018  . Chronic combined systolic and diastolic CHF (congestive heart failure) ([place]) 09/22/2018  . Chronic combined systolic and diastolic CHF (congestive heart failure) ([place]) 09/22/2018  . Dysrhythmia    went into Atrial Fibrillation after last surgery, HAS PALPITATIONS  . History of skin cancer   . Hypertension   . Hypothyroidism   . PVC (premature ventricular contraction)   . Thyroiditis 1973   Past Surgical History:  Procedure  Laterality Date  . ABDOMINAL HYSTERECTOMY  90   TAH BSO  . APPENDECTOMY    . BREAST LUMPECTOMY Left 05/30/2017  . BREAST LUMPECTOMY WITH RADIOACTIVE SEED LOCALIZATION Left 05/30/2017   Procedure: LEFT BREAST LUMPECTOMY WITH RADIOACTIVE SEED LOCALIZATION;  Surgeon: Excell Seltzer, MD;  Location: [facility];  Service: General;  Laterality: Left;  . CATARACT EXTRACTION  09/10/2018   Right eye  . CHOLECYSTECTOMY     dr Georgia Lopes  . ELBOW SURGERY    . EYE SURGERY     "on my eyelids" years ago  . KNEE ARTHROSCOPY     left  . THYROIDECTOMY  1973  . TONSILLECTOMY    . TOTAL HIP ARTHROPLASTY Right 05/10/2016   Procedure: TOTAL HIP ARTHROPLASTY ANTERIOR APPROACH;  Surgeon: Paralee Cancel, MD;  Location: [facility];  Service: Orthopedics;  Laterality: Right;  . TOTAL HIP ARTHROPLASTY Left 01/10/2017   Procedure: LEFT TOTAL HIP ARTHROPLASTY ANTERIOR APPROACH;  Surgeon: Paralee Cancel, MD;  Location: [facility];  Service: Orthopedics;  Laterality: Left;  . TOTAL KNEE ARTHROPLASTY Left 09/28/2015   Procedure: LEFT TOTAL KNEE ARTHROPLASTY;  Surgeon: Paralee Cancel, MD;  Location: [facility];  Service: Orthopedics;  Laterality: Left;  . TOTAL KNEE ARTHROPLASTY Right 08/09/2016   Procedure: RIGHT TOTAL KNEE ARTHROPLASTY;  Surgeon: Paralee Cancel, MD;  Location: [facility];  Service: Orthopedics;  Laterality: Right;  Adductor Block  . Total Left hip arthroplasty     01/10/17 Dr.  Platinum   Family History  Problem Relation Age of Onset  . Diabetes Father   . Hypertension Father   . Stroke Father   . Breast cancer Cousin   . Pneumonia Sister     Social History   Tobacco Use  . Smoking status: Former Smoker    Packs/day: 1.00    Years: 20.00    Pack years: 20.00    Types: Cigarettes    Quit date: 07/26/1987    Years since quitting: 33.2  . Smokeless tobacco: Never Used  Substance Use Topics  . Alcohol use: No    Alcohol/week: 0.0 standard drinks  Marital Status: Married   ROS  Review of Systems   Constitutional: Positive for malaise/fatigue (Much improved). Negative for diaphoresis and weight gain.  Cardiovascular: Positive for dyspnea on exertion (mild) and leg swelling (intermittent, stable and mild). Negative for chest pain, orthopnea, palpitations and syncope.  Respiratory: Negative for cough.   Musculoskeletal: Positive for arthritis and joint pain. Negative for joint swelling.  Gastrointestinal: Negative for nausea and vomiting.  Neurological: Positive for loss of balance. Negative for light-headedness.   Objective  Blood pressure (!) 181/90, pulse 69, temperature (!) 97.4 F (36.3 C), temperature source Temporal, resp. rate 16, height 5\' 4"  (1.626 m), weight 127 lb 3.2 oz (57.7 kg), last menstrual period 07/25/1988, SpO2 (!) 69 %.  Vitals with BMI 10/19/2020 09/09/2020 08/13/2020  Height 5\' 4"  5\' 4"  -  Weight 127 lbs 3 oz 129 lbs 13 oz -  BMI 84.16 60.63 -  Systolic 016 010 932  Diastolic 90 84 81  Pulse 69 84 65    Orthostatic VS for the past 72 hrs (Last 3 readings):  Patient Position BP Location Cuff Size  10/19/20 1004 Sitting Left Arm Normal     Physical Exam Constitutional:      General: She is not in acute distress.    Appearance: She is ill-appearing.     Comments: Moderately build, frail  Cardiovascular:     Rate and Rhythm: Normal rate and regular rhythm.     Pulses: Intact distal pulses.          Radial pulses are 2+ on the right side and 2+ on the left side.       Dorsalis pedis pulses are 1+ on the right side and 1+ on the left side.       Posterior tibial pulses are 1+ on the right side and 1+ on the left side.     Heart sounds: S1 normal and S2 normal. No murmur heard. No gallop.   Pulmonary:     Effort: No accessory muscle usage or respiratory distress.     Breath sounds: Examination of the right-lower field reveals rhonchi. Examination of the left-lower field reveals rhonchi. Rhonchi (bibasilar coarse crackles, right worse, at the bases) present.   Musculoskeletal:        General: Swelling (trace edema) present.     Cervical back: Neck supple.    Laboratory examination:   Recent Labs    03/27/20 1458 06/23/20 1100 08/17/20 1026 09/09/20 1319  NA 139 141 139 143  K 3.7 4.0 3.4* 4.2  CL 104 107 96 104  CO2 26 26 28 23   GLUCOSE 100* 88 89 81  BUN 17 22 28* 22  CREATININE 0.99 0.84 1.56* 0.78  CALCIUM 7.9* 8.7* 9.1 9.2  GFRNONAA 53* >60 31* 71  GFRAA >60  --  35* 82   CrCl cannot be calculated (Patient's most  recent lab result is older than the maximum 21 days allowed.).  CMP Latest Ref Rng & Units 09/09/2020 08/17/2020 06/23/2020  Glucose 65 - 99 mg/dL 81 89 88  BUN 8 - 27 mg/dL 22 28(H) 22  Creatinine 0.57 - 1.00 mg/dL 0.78 1.56(H) 0.84  Sodium 134 - 144 mmol/L 143 139 141  Potassium 3.5 - 5.2 mmol/L 4.2 3.4(L) 4.0  Chloride 96 - 106 mmol/L 104 96 107  CO2 20 - 29 mmol/L 23 28 26   Calcium 8.7 - 10.3 mg/dL 9.2 9.1 8.7(L)  Total Protein 6.5 - 8.1 g/dL - - 5.7(L)  Total Bilirubin 0.3 - 1.2 mg/dL - - 0.3  Alkaline Phos 38 - 126 U/L - - 66  AST 15 - 41 U/L - - 9(L)  ALT 0 - 44 U/L - - <6   CBC Latest Ref Rng & Units 09/09/2020 08/17/2020 07/30/2020  WBC 3.4 - 10.8 x10E3/uL 7.0 6.4 10.1  Hemoglobin 11.1 - 15.9 g/dL 10.7(L) 11.4 10.0(L)  Hematocrit 34.0 - 46.6 % 34.5 36.9 32.7(L)  Platelets 150 - 450 x10E3/uL 213 222 262   Lipid Panel     Component Value Date/Time   CHOL 142 07/31/2018 0655   TRIG 72 07/31/2018 0655   HDL 45 07/31/2018 0655   CHOLHDL 3.2 07/31/2018 0655   VLDL 14 07/31/2018 0655   LDLCALC 83 07/31/2018 0655   HEMOGLOBIN A1C Lab Results  Component Value Date   HGBA1C 5.0 07/31/2018   MPG 96.8 07/31/2018   TSH Recent Labs    03/21/20 2329  TSH 5.095*   BNP (last 3 results) Recent Labs    03/21/20 2116 08/17/20 1026 09/09/20 1320  BNP 1,091.1* 327.1* 1,136.5*    External labs:   Lipid Panel completed 04/09/2019 HDL 38 MG/DL 04/09/2019 LDL 80.000 mg 04/09/2019 Cholesterol, total  143.000 m 04/09/2019 Triglycerides 127.000 04/09/2019  A1C 5.000 07/31/2018  Glucose Random 110.000 m 11/04/2019 MicroAlbumin Urine 9.000 04/25/2019 MicroAlbumin/Creat 16.8 MG/ 04/25/2019 BUN 18.000 mg 11/04/2019 Creatinine, Serum 0.700 mg/ 11/04/2019  TSH 2.620 micr 11/04/2019  Medications and allergies   Allergies  Allergen Reactions  . Codeine Nausea Only  . Epinephrine     Tachcardyia, chest pains tremors  . Tenormin [Atenolol] Rash    Outpatient Medications Prior to Visit  Medication Sig Dispense Refill  . acetaminophen (TYLENOL) 500 MG tablet Take 1,000 mg by mouth 3 (three) times daily as needed for moderate pain.     Marland Kitchen amiodarone (PACERONE) 200 MG tablet Take 0.5 tablets (100 mg total) by mouth daily. (Patient taking differently: Take 200 mg by mouth daily.) 90 tablet 1  . amLODipine (NORVASC) 5 MG tablet     . Ferrous Sulfate (IRON) 325 (65 Fe) MG TABS     . furosemide (LASIX) 40 MG tablet Take 1 tablet (40 mg total) by mouth as directed. Daily. Take twice daily starting 08/13/20 through 08/17/20 (Patient taking differently: Take 40 mg by mouth as directed. Once every 3 days. Take extra if leg swelling) 90 tablet 1  . levothyroxine (SYNTHROID, LEVOTHROID) 112 MCG tablet Take 112-168 mcg by mouth daily before breakfast. Take 1 tablet (112 mcg) on Mondays, Tuesdays, Wednesdays, Thursdays, Fridays & Saturdays. Take 1.5 tablet (168 mcg) on Sundays    . losartan (COZAAR) 25 MG tablet Take 25 mg by mouth daily.    . metoprolol succinate (TOPROL-XL) 50 MG 24 hr tablet Take 1 tablet (50 mg total) by mouth daily. (Patient taking differently: Take 25 mg by mouth daily.) 90 tablet 1  .  pantoprazole (PROTONIX) 40 MG tablet Take 40 mg by mouth daily as needed (indigestion/heartburn.).     Marland Kitchen meclizine (ANTIVERT) 25 MG tablet     . potassium chloride (KLOR-CON) 10 MEQ tablet Take 1 tablet (10 mEq total) by mouth 2 (two) times daily as needed. With Furosemide only 60 tablet 1   No  facility-administered medications prior to visit.    Radiology:   No results found.  Cardiac Studies:   Lexiscan Myoview stress test  04/23/2018: 1. The resting electrocardiogram demonstrated normal sinus rhythm, normal resting conduction and no resting arrhythmias.  Stress EKG is non-diagnostic for ischemia as it a pharmacologic stress using Lexiscan. Occasional PAC and PVC. The patient developed significant symptoms which included Chest Pain, Stomach pain, Headache.  2. The overall quality of the study is good. There is no evidence of abnormal lung activity. Stress and rest SPECT images demonstrate homogeneous tracer distribution throughout the myocardium. Gated SPECT imaging reveals diffuse global hypokinesis. The left ventricular ejection fraction was markedly depressed at (26%).   3. This is a high risk study due to severe LV systolic dysfunction. Findings may represent non ischemic cardiomyopathy.  Echocardiogram 07/23/2020: Left ventricle cavity is normal in size and wall thickness. Severe global hypokinesis with visual LVEF 20-25%.  Doppler evidence of grade II (pseudonormal) diastolic dysfunction, elevated LAP.  The aortic root is mildly dilated at 3.9 cm. Left atrial cavity is moderately dilated. Trileaflet aortic valve.  Moderate (Grade III) aortic regurgitation. Structurally normal mitral valve.  Moderate (Grade III) mitral regurgitation. Mild tricuspid regurgitation.  No evidence of pulmonary hypertension. Compared to previous study on 03/10/2021, LVEF is marginally improved from 15-20%. Mild pulmonary hypertension is now absent.     EKG    EKG 07/14/2020: Normal sinus rhythm with rate of 77 bpm, left axis deviation, left intrafascicular block.  Poor R wave progression, cannot exclude anteroseptal infarct old.  Normal QT interval.  Single PAC.  No change from 01/10/2020.  EKG 03/25/2020: Atrial fibrillation with rapid ventricular response at the rate of 140 bpm, left axis  deviation, left intrafascicular block.  Poor R wave progression, cannot exclude anteroseptal infarct old.  Nonspecific ST-T abnormality, cannot exclude lateral ischemia.   Assessment     ICD-10-CM   1. Chronic systolic heart failure (HCC)  I50.22 potassium chloride (KLOR-CON) 10 MEQ tablet  2. Nonischemic cardiomyopathy (HCC)  I42.8   3. Orthostatic hypotension  I95.1   4. Supine hypertension  I10     CHA2DS2-VASc Score is 5.  Yearly risk of stroke: 7.2% (A, F, HTN, CHF).  Score of 1=0.6; 2=2.2; 3=3.2; 4=4.8; 5=7.2; 6=9.8; 7=>9.8) -(CHF; HTN; vasc disease DM,  Female = 1; Age <65 =0; 65-74 = 1,  >75 =2; stroke/embolism= 2).   Meds ordered this encounter  Medications  . potassium chloride (KLOR-CON) 10 MEQ tablet    Sig: Take 1 tablet (10 mEq total) by mouth daily as needed. With Furosemide only    Dispense:  60 tablet    Refill:  1    Medications Discontinued During This Encounter  Medication Reason  . meclizine (ANTIVERT) 25 MG tablet Error  . potassium chloride (KLOR-CON) 10 MEQ tablet Error    No orders of the defined types were placed in this encounter.   Recommendations:   KENYATTA GLOECKNER  is a 85 y.o. female  with bilateral hip replacement and knee replacement and left breast lumpectomy in 2018 without chemotherapy or radiation therapy for breast cancer and also remote hysterectomy due to  uterine cancer in 1991, supine hypertension and orthostatic hypotension, paroxysmal atrial fibrillation on chronic Eliquis, chronic dizziness on standing. Has not been able to tolerate any BP medications due to severe dizzy spells.  She also has history of A. fib with RVR SP direct-current cardioversion due to acute decompensated heart failure with severely systolic dysfunction on 03/27/8181 and also GI bleed at the time.  She is now tolerating Eliquis.  She went appointment to see me as she noticed "rash" on her skin.  On exam she is well compensated, with near resolution of leg edema, her skin is  wrinkled but she appears adequately hydrated and euvolemic.  There is no neck veins, lungs are clear except for coarse crackles in the right base that are chronic.  Hence I simply reassured her stating that she is very well compensated with regard to heart failure.  She has been taking Lasix every third day which she will continue for now.  She will also restart potassium supplements every time she takes furosemide.  Clear-cut instructions were given to the patient and her husband at the bedside.  Blood pressure is again elevated however she is extremely skeptical of starting any new medication.  Fortunately she is at least tolerating 25 mg of metoprolol succinate, continue the same.  She also has orthostatic hypotension making treatment of her heart failure and hypertension extremely difficult place she has multiple medication intolerances as well.  Hence I did not make any changes today and I will see her back in 2 months for follow-up at patient's request.   Adrian Prows, MD, [facility] 10/19/2020, 10:34 AM Office: [phone]

## 2020-10-20 ENCOUNTER — Other Ambulatory Visit: Payer: Self-pay | Admitting: Cardiology

## 2020-10-20 DIAGNOSIS — I480 Paroxysmal atrial fibrillation: Secondary | ICD-10-CM

## 2020-10-22 MED ORDER — BETAMETHASONE SOD PHOS & ACET 6 (3-3) MG/ML IJ SUSP
6.0000 mg | Freq: Once | INTRAMUSCULAR | Status: AC
  Administered 2020-10-22: 6 mg

## 2020-10-22 NOTE — Progress Notes (Signed)
  Subjective:  Patient ID: Heidi Adams, female    DOB: [birth year],  MRN: [id number]  Chief Complaint  Patient presents with  . Plantar Fasciitis    The shot helped a little bit on the right heel   . Foot Problem    I have some dry skin on both feet     85 y.o. female presents with the above complaint. History confirmed with patient.  Having pain to the outside of the right ankle denies new issue.  Worst with activity states that she cannot be on it and has having difficulty walking. Objective:  Physical Exam: warm, good capillary refill, no trophic changes or ulcerative lesions, normal DP and PT pulses and normal sensory exam. Right Foot: Pain to palpation about the peroneal tendon sheath.  No pain at the dorsal midfoot or heel.  Assessment:   1. Peroneal tendonitis, right    Plan:  Patient was evaluated and treated and all questions answered.  Tendonitis -X-rays reviewed as above -Dispensed Tri-Lock ankle brace.  Patient educated on use -Injection delivered to the peroneal tendon sheath  Procedure: Injection tendon Consent: Verbal consent obtained. Location: Right peroneal tendon sheath Skin Prep: alcohol. Injectate: 1 cc 0.5% marcaine plain, 1 cc betamethasone acetate-betamethasone sodium phosphate Disposition: Patient tolerated procedure well. Injection site dressed with a band-aid.   Return in about 1 month (around 11/12/2020) for Peroneal tendonitis.

## 2020-10-24 DIAGNOSIS — I110 Hypertensive heart disease with heart failure: Secondary | ICD-10-CM | POA: Diagnosis not present

## 2020-10-26 DIAGNOSIS — I480 Paroxysmal atrial fibrillation: Secondary | ICD-10-CM | POA: Diagnosis not present

## 2020-10-26 DIAGNOSIS — I5032 Chronic diastolic (congestive) heart failure: Secondary | ICD-10-CM | POA: Diagnosis not present

## 2020-10-26 DIAGNOSIS — I10 Essential (primary) hypertension: Secondary | ICD-10-CM | POA: Diagnosis not present

## 2020-10-27 NOTE — Progress Notes (Signed)
Patient Care Team: [name], MD as PCP - General (Internal Medicine)  DIAGNOSIS:    ICD-10-CM   1. Iron deficiency anemia due to chronic blood loss  D50.0 CBC with Differential (Cancer Center Only)    Vitamin B12    Iron and TIBC    Ferritin    CMP ([place] only)  2. Malignant neoplasm of upper-inner quadrant of left breast in female, estrogen receptor positive ([place])  C50.212 CBC with Differential ([place] Only)   Z17.0 Vitamin B12    Iron and TIBC    Ferritin    CMP ([place] only)    SUMMARY OF ONCOLOGIC HISTORY: Oncology History  Malignant neoplasm of upper-inner quadrant of left breast in female, estrogen receptor positive ([place])  05/15/2017 Initial Diagnosis   Malignant neoplasm of upper-inner quadrant of left breast in female, estrogen receptor positive ([place]): Microscopic focus suspicious for IDC low-grade   05/30/2017 Surgery   Left lumpectomy: IDC grade 1, 0.8 cm, with low-grade DCIS, ER 100%, PR 50%, Ki-67 5%, HER-2 negative ratio 1.5, T1BN0 stage I a    Miscellaneous   Refused radiation and Anti-estrogen therapy     CHIEF COMPLIANT: Follow-up of breast cancer   INTERVAL HISTORY: Heidi Adams is a 85 y.o. with above-mentioned history of breast cancer treated with lumpectomy, sherefused radiationand antiestrogen therapy. She is currently on surveillance. She presents to the clinic today for follow-up.  She is here today in a wheelchair.  She reports that she is feeling a lot better.  Denies any shortness of breath to exertion or dizziness or lightheadedness.  She is still fairly frail but overall her energy levels are better.  She is eating good food.  ALLERGIES:  is allergic to codeine, epinephrine, and tenormin [atenolol].  MEDICATIONS:  Current Outpatient Medications  Medication Sig Dispense Refill  . acetaminophen (TYLENOL) 500 MG tablet Take 1,000 mg by mouth 3 (three) times daily as needed for moderate pain.     Marland Kitchen amiodarone (PACERONE)  200 MG tablet TAKE 1 TABLET BY MOUTH EVERY DAY 90 tablet 1  . amLODipine (NORVASC) 5 MG tablet     . Ferrous Sulfate (IRON) 325 (65 Fe) MG TABS     . furosemide (LASIX) 40 MG tablet Take 1 tablet (40 mg total) by mouth as directed. Daily. Take twice daily starting 08/13/20 through 08/17/20 (Patient taking differently: Take 40 mg by mouth as directed. Once every 3 days. Take extra if leg swelling) 90 tablet 1  . levothyroxine (SYNTHROID, LEVOTHROID) 112 MCG tablet Take 112-168 mcg by mouth daily before breakfast. Take 1 tablet (112 mcg) on Mondays, Tuesdays, Wednesdays, Thursdays, Fridays & Saturdays. Take 1.5 tablet (168 mcg) on Sundays    . losartan (COZAAR) 25 MG tablet Take 25 mg by mouth daily.    . metoprolol succinate (TOPROL-XL) 50 MG 24 hr tablet Take 1 tablet (50 mg total) by mouth daily. (Patient taking differently: Take 25 mg by mouth daily.) 90 tablet 1  . pantoprazole (PROTONIX) 40 MG tablet Take 40 mg by mouth daily as needed (indigestion/heartburn.).     Marland Kitchen potassium chloride (KLOR-CON) 10 MEQ tablet Take 1 tablet (10 mEq total) by mouth daily as needed. With Furosemide only 60 tablet 1   No current facility-administered medications for this visit.    PHYSICAL EXAMINATION: ECOG PERFORMANCE STATUS: 1 - Symptomatic but completely ambulatory  Vitals:   10/28/20 1058  BP: (!) 181/76  Pulse: 67  Resp: 19  Temp: (!) 97.4 F (36.3  C)  SpO2: 98%   Filed Weights   10/28/20 1058  Weight: 127 lb 3.2 oz (57.7 kg)    LABORATORY DATA:  I have reviewed the data as listed CMP Latest Ref Rng & Units 09/09/2020 08/17/2020 06/23/2020  Glucose 65 - 99 mg/dL 81 89 88  BUN 8 - 27 mg/dL 22 28(H) 22  Creatinine 0.57 - 1.00 mg/dL 0.78 1.56(H) 0.84  Sodium 134 - 144 mmol/L 143 139 141  Potassium 3.5 - 5.2 mmol/L 4.2 3.4(L) 4.0  Chloride 96 - 106 mmol/L 104 96 107  CO2 20 - 29 mmol/L 23 28 26   Calcium 8.7 - 10.3 mg/dL 9.2 9.1 8.7(L)  Total Protein 6.5 - 8.1 g/dL - - 5.7(L)  Total  Bilirubin 0.3 - 1.2 mg/dL - - 0.3  Alkaline Phos 38 - 126 U/L - - 66  AST 15 - 41 U/L - - 9(L)  ALT 0 - 44 U/L - - <6    Lab Results  Component Value Date   WBC 7.2 10/28/2020   HGB 11.4 (L) 10/28/2020   HCT 37.1 10/28/2020   MCV 91.4 10/28/2020   PLT 215 10/28/2020   NEUTROABS 5.7 10/28/2020    ASSESSMENT & PLAN:  Malignant neoplasm of upper-inner quadrant of left breast in female, estrogen receptor positive (HCC) 05/30/2017:Left lumpectomy: IDC grade 1, 0.8 cm, with low-grade DCIS, ER 100%, PR 50%, Ki-67 5%, HER-2 negative ratio 1.5, T1BN0 stage I a  Did not receive adj XRT Patient also refused antiestrogen therapy  Iron deficiency anemia due to chronic blood loss Complex anemia due to blood loss and also anemia of chronic disease  Malenotic stools with microcytic anemia: Likely GI Bleed. Since 03/20/2020 Follows with gastroenterology with Dr. Cristina Gong Eliquis Discontinued Blood transfusion given 06/23/20 (hospitalization for A.Fib with RVR)  IV iron: Venofer December 2021 Lab review:  10/28/2020: Hemoglobin 11.4 iron studies and CMP are pending She feels significantly better than before.  Return to clinic in 3 months with labs and follow-up.  If the labs look stable then we can see her less often after that.    Orders Placed This Encounter  Procedures  . CBC with Differential (Cancer Center Only)    Standing Status:   Future    Standing Expiration Date:   10/28/2021  . Vitamin B12    Standing Status:   Future    Standing Expiration Date:   10/28/2021  . Iron and TIBC    Standing Status:   Future    Standing Expiration Date:   10/28/2021  . Ferritin    Standing Status:   Future    Standing Expiration Date:   10/28/2021  . CMP ([place] only)    Standing Status:   Future    Standing Expiration Date:   10/28/2021   The patient has a good understanding of the overall plan. she agrees with it. she will call with any problems that may develop before the next visit  here.  Total time spent: 20 mins including face to face time and time spent for planning, charting and coordination of care  Rulon Eisenmenger, MD, MPH 10/28/2020  I, Cloyde Reams [place], am acting as scribe for Dr. Nicholas Lose.  I have reviewed the above documentation for accuracy and completeness, and I agree with the above.

## 2020-10-28 ENCOUNTER — Inpatient Hospital Stay: Payer: PPO

## 2020-10-28 ENCOUNTER — Other Ambulatory Visit: Payer: Self-pay

## 2020-10-28 ENCOUNTER — Inpatient Hospital Stay: Payer: PPO | Attending: Hematology and Oncology | Admitting: Hematology and Oncology

## 2020-10-28 VITALS — BP 181/76 | HR 67 | Temp 97.4°F | Resp 19 | Ht 64.0 in | Wt 127.2 lb

## 2020-10-28 DIAGNOSIS — D509 Iron deficiency anemia, unspecified: Secondary | ICD-10-CM | POA: Insufficient documentation

## 2020-10-28 DIAGNOSIS — C50212 Malignant neoplasm of upper-inner quadrant of left female breast: Secondary | ICD-10-CM | POA: Diagnosis not present

## 2020-10-28 DIAGNOSIS — Z170 Estrogen receptor positive status [ER+]: Secondary | ICD-10-CM

## 2020-10-28 DIAGNOSIS — I4891 Unspecified atrial fibrillation: Secondary | ICD-10-CM | POA: Insufficient documentation

## 2020-10-28 DIAGNOSIS — Z79899 Other long term (current) drug therapy: Secondary | ICD-10-CM | POA: Insufficient documentation

## 2020-10-28 DIAGNOSIS — D500 Iron deficiency anemia secondary to blood loss (chronic): Secondary | ICD-10-CM | POA: Diagnosis not present

## 2020-10-28 DIAGNOSIS — E039 Hypothyroidism, unspecified: Secondary | ICD-10-CM | POA: Diagnosis not present

## 2020-10-28 DIAGNOSIS — C50512 Malignant neoplasm of lower-outer quadrant of left female breast: Secondary | ICD-10-CM | POA: Diagnosis not present

## 2020-10-28 LAB — CBC WITH DIFFERENTIAL (CANCER CENTER ONLY)
Abs Immature Granulocytes: 0.02 10*3/uL (ref 0.00–0.07)
Basophils Absolute: 0 10*3/uL (ref 0.0–0.1)
Basophils Relative: 0 %
Eosinophils Absolute: 0.2 10*3/uL (ref 0.0–0.5)
Eosinophils Relative: 3 %
HCT: 37.1 % (ref 36.0–46.0)
Hemoglobin: 11.4 g/dL — ABNORMAL LOW (ref 12.0–15.0)
Immature Granulocytes: 0 %
Lymphocytes Relative: 13 %
Lymphs Abs: 0.9 10*3/uL (ref 0.7–4.0)
MCH: 28.1 pg (ref 26.0–34.0)
MCHC: 30.7 g/dL (ref 30.0–36.0)
MCV: 91.4 fL (ref 80.0–100.0)
Monocytes Absolute: 0.3 10*3/uL (ref 0.1–1.0)
Monocytes Relative: 5 %
Neutro Abs: 5.7 10*3/uL (ref 1.7–7.7)
Neutrophils Relative %: 79 %
Platelet Count: 215 10*3/uL (ref 150–400)
RBC: 4.06 MIL/uL (ref 3.87–5.11)
RDW: 17.6 % — ABNORMAL HIGH (ref 11.5–15.5)
WBC Count: 7.2 10*3/uL (ref 4.0–10.5)
nRBC: 0 % (ref 0.0–0.2)

## 2020-10-28 LAB — CMP (CANCER CENTER ONLY)
ALT: 18 U/L (ref 0–44)
AST: 13 U/L — ABNORMAL LOW (ref 15–41)
Albumin: 3.6 g/dL (ref 3.5–5.0)
Alkaline Phosphatase: 61 U/L (ref 38–126)
Anion gap: 12 (ref 5–15)
BUN: 22 mg/dL (ref 8–23)
CO2: 25 mmol/L (ref 22–32)
Calcium: 8.7 mg/dL — ABNORMAL LOW (ref 8.9–10.3)
Chloride: 107 mmol/L (ref 98–111)
Creatinine: 0.86 mg/dL (ref 0.44–1.00)
GFR, Estimated: >60 mL/min (ref >60)
Glucose, Bld: 88 mg/dL (ref 70–99)
Potassium: 4 mmol/L (ref 3.5–5.1)
Sodium: 144 mmol/L (ref 135–145)
Total Bilirubin: 0.7 mg/dL (ref 0.3–1.2)
Total Protein: 6 g/dL — ABNORMAL LOW (ref 6.5–8.1)

## 2020-10-28 LAB — IRON AND TIBC
Iron: 34 ug/dL — ABNORMAL LOW (ref 41–142)
Saturation Ratios: 13 % — ABNORMAL LOW (ref 21–57)
TIBC: 265 ug/dL (ref 236–444)
UIBC: 231 ug/dL (ref 120–384)

## 2020-10-28 LAB — FERRITIN: Ferritin: 116 ng/mL (ref 11–307)

## 2020-10-28 NOTE — Assessment & Plan Note (Signed)
05/30/2017:Left lumpectomy: IDC grade 1, 0.8 cm, with low-grade DCIS, ER 100%, PR 50%, Ki-67 5%, HER-2 negative ratio 1.5, T1BN0 stage I a  Did not receive adj XRT Patient also refused antiestrogen therapy  Iron deficiency anemia due to chronic blood loss Complex anemia due to blood loss and also anemia of chronic disease  Malenotic stools with microcytic anemia: Likely GI Bleed. Since 03/20/2020 Follows with gastroenterology with Dr. Cristina Gong Eliquis Discontinued Blood transfusion given 06/23/20 (hospitalization for A.Fib with RVR)  IV iron: Venofer December 2021 Lab review:   Return to clinic in 3 months with labs and follow-up

## 2020-10-29 ENCOUNTER — Telehealth: Payer: Self-pay | Admitting: Hematology and Oncology

## 2020-10-29 NOTE — Telephone Encounter (Signed)
Scheduled appt per 4/6 los. Pt aware. Mailed calendar per pt request.

## 2020-11-16 ENCOUNTER — Ambulatory Visit: Payer: PPO | Admitting: Cardiology

## 2020-11-23 DIAGNOSIS — I110 Hypertensive heart disease with heart failure: Secondary | ICD-10-CM | POA: Diagnosis not present

## 2020-11-25 DIAGNOSIS — I480 Paroxysmal atrial fibrillation: Secondary | ICD-10-CM | POA: Diagnosis not present

## 2020-11-25 DIAGNOSIS — I10 Essential (primary) hypertension: Secondary | ICD-10-CM | POA: Diagnosis not present

## 2020-11-25 DIAGNOSIS — I5032 Chronic diastolic (congestive) heart failure: Secondary | ICD-10-CM | POA: Diagnosis not present

## 2020-12-03 ENCOUNTER — Other Ambulatory Visit: Payer: Self-pay | Admitting: Cardiology

## 2020-12-03 DIAGNOSIS — I5023 Acute on chronic systolic (congestive) heart failure: Secondary | ICD-10-CM

## 2020-12-10 DIAGNOSIS — L308 Other specified dermatitis: Secondary | ICD-10-CM | POA: Diagnosis not present

## 2020-12-10 DIAGNOSIS — Z85828 Personal history of other malignant neoplasm of skin: Secondary | ICD-10-CM | POA: Diagnosis not present

## 2020-12-16 ENCOUNTER — Other Ambulatory Visit: Payer: Self-pay

## 2020-12-16 ENCOUNTER — Ambulatory Visit: Payer: PPO | Admitting: Cardiology

## 2020-12-16 ENCOUNTER — Encounter: Payer: Self-pay | Admitting: Cardiology

## 2020-12-16 VITALS — BP 175/87 | HR 70 | Temp 98.0°F | Resp 17 | Ht 64.0 in | Wt 124.2 lb

## 2020-12-16 DIAGNOSIS — I10 Essential (primary) hypertension: Secondary | ICD-10-CM

## 2020-12-16 DIAGNOSIS — I480 Paroxysmal atrial fibrillation: Secondary | ICD-10-CM

## 2020-12-16 DIAGNOSIS — I5022 Chronic systolic (congestive) heart failure: Secondary | ICD-10-CM | POA: Diagnosis not present

## 2020-12-16 DIAGNOSIS — I951 Orthostatic hypotension: Secondary | ICD-10-CM

## 2020-12-16 DIAGNOSIS — I428 Other cardiomyopathies: Secondary | ICD-10-CM

## 2020-12-16 NOTE — Progress Notes (Signed)
Primary Physician/Referring:  [name], MD  Patient ID: [name], female    DOB: Sep 26, 1935, 85 y.o.   MRN: [id number]  Chief Complaint  Patient presents with  . Congestive Heart Failure  . Follow-up    2 MONTH   HPI:    MILIANI DEIKE  is a 85 y.o. female  with bilateral hip replacement and knee replacement and left breast lumpectomy in 2018 without chemotherapy or radiation therapy for breast cancer and also remote hysterectomy due to uterine cancer in 1991, supine hypertension and orthostatic hypotension, paroxysmal atrial fibrillation on chronic Eliquis, chronic dizziness on standing. Has not been able to tolerate any BP medications due to severe dizzy spells.  She also has history of A. fib with RVR SP direct-current cardioversion due to acute decompensated heart failure with severely systolic dysfunction on 02/28/7671 and also GI bleed at the time.  She is now tolerating Eliquis.  This is a 3 month OV and she is accompanied by her husband.  Dyspnea is remained stable, no PND or orthopnea, no worsening leg edema.  She is now taking Lasix every third day.  No chest pain or palpitations.  She is wondering if the medication she is taking is making her feel tired during the daytime and would like to discontinue medications.  Past Medical History:  Diagnosis Date  . Allergy   . Anemia    hx of anemia  . Arthritis   . Breast cancer ([place])    Left breast  . Cancer ([place])    skin ca nose  . Chronic combined systolic and diastolic CHF (congestive heart failure) ([place]) 09/22/2018  . Chronic combined systolic and diastolic CHF (congestive heart failure) ([place]) 09/22/2018  . Chronic combined systolic and diastolic CHF (congestive heart failure) ([place]) 09/22/2018  . Dysrhythmia    went into Atrial Fibrillation after last surgery, HAS PALPITATIONS  . History of skin cancer   . Hypertension   . Hypothyroidism   . PVC (premature ventricular contraction)   . Thyroiditis 1973   Past Surgical  History:  Procedure Laterality Date  . ABDOMINAL HYSTERECTOMY  90   TAH BSO  . APPENDECTOMY    . BREAST LUMPECTOMY Left 05/30/2017  . BREAST LUMPECTOMY WITH RADIOACTIVE SEED LOCALIZATION Left 05/30/2017   Procedure: LEFT BREAST LUMPECTOMY WITH RADIOACTIVE SEED LOCALIZATION;  Surgeon: Excell Seltzer, MD;  Location: [facility];  Service: General;  Laterality: Left;  . CATARACT EXTRACTION  09/10/2018   Right eye  . CHOLECYSTECTOMY     dr Georgia Lopes  . ELBOW SURGERY    . EYE SURGERY     "on my eyelids" years ago  . KNEE ARTHROSCOPY     left  . THYROIDECTOMY  1973  . TONSILLECTOMY    . TOTAL HIP ARTHROPLASTY Right 05/10/2016   Procedure: TOTAL HIP ARTHROPLASTY ANTERIOR APPROACH;  Surgeon: Paralee Cancel, MD;  Location: [facility];  Service: Orthopedics;  Laterality: Right;  . TOTAL HIP ARTHROPLASTY Left 01/10/2017   Procedure: LEFT TOTAL HIP ARTHROPLASTY ANTERIOR APPROACH;  Surgeon: Paralee Cancel, MD;  Location: [facility];  Service: Orthopedics;  Laterality: Left;  . TOTAL KNEE ARTHROPLASTY Left 09/28/2015   Procedure: LEFT TOTAL KNEE ARTHROPLASTY;  Surgeon: Paralee Cancel, MD;  Location: [facility];  Service: Orthopedics;  Laterality: Left;  . TOTAL KNEE ARTHROPLASTY Right 08/09/2016   Procedure: RIGHT TOTAL KNEE ARTHROPLASTY;  Surgeon: Paralee Cancel, MD;  Location: [facility];  Service: Orthopedics;  Laterality: Right;  Adductor Block  . Total Left  hip arthroplasty     01/10/17 Dr. Alvan Dame   Family History  Problem Relation Age of Onset  . Diabetes Father   . Hypertension Father   . Stroke Father   . Breast cancer Cousin   . Pneumonia Sister     Social History   Tobacco Use  . Smoking status: Former Smoker    Packs/day: 1.00    Years: 20.00    Pack years: 20.00    Types: Cigarettes    Quit date: 07/26/1987    Years since quitting: 33.4  . Smokeless tobacco: Never Used  Substance Use Topics  . Alcohol use: No    Alcohol/week: 0.0 standard drinks  Marital Status: Married   ROS   Review of Systems  Constitutional: Negative for diaphoresis and weight gain.  Cardiovascular: Positive for dyspnea on exertion (mild) and leg swelling (intermittent, stable and mild). Negative for chest pain, orthopnea, palpitations and syncope.  Respiratory: Positive for cough.   Musculoskeletal: Positive for arthritis and joint pain. Negative for joint swelling.  Gastrointestinal: Negative for nausea and vomiting.  Neurological: Positive for loss of balance. Negative for light-headedness.   Objective  Blood pressure (!) 175/87, pulse 70, temperature 98 F (36.7 C), temperature source Temporal, resp. rate 17, height 5\' 4"  (1.626 m), weight 124 lb 3.2 oz (56.3 kg), last menstrual period 07/25/1988, SpO2 97 %.  Vitals with BMI 12/16/2020 12/16/2020 10/28/2020  Height - 5\' 4"  5\' 4"   Weight - 124 lbs 3 oz 127 lbs 3 oz  BMI - 40.10 27.25  Systolic 366 440 347  Diastolic 87 89 76  Pulse 70 70 67    Orthostatic VS for the past 72 hrs (Last 3 readings):  Patient Position BP Location Cuff Size  12/16/20 1033 Sitting Left Arm Normal  12/16/20 1024 Sitting Left Arm Normal     Physical Exam Constitutional:      General: She is not in acute distress.    Appearance: She is ill-appearing.     Comments: Moderately build, frail  Cardiovascular:     Rate and Rhythm: Normal rate and regular rhythm.     Pulses: Intact distal pulses.          Radial pulses are 2+ on the right side and 2+ on the left side.       Dorsalis pedis pulses are 1+ on the right side and 1+ on the left side.       Posterior tibial pulses are 1+ on the right side and 1+ on the left side.     Heart sounds: S1 normal and S2 normal. Murmur heard.  High-pitched blowing holosystolic murmur is present with a grade of 2/6 at the apex. High-pitched blowing decrescendo early diastolic murmur is present with a grade of 3/4 at the upper right sternal border radiating to the apex. No gallop.   Pulmonary:     Effort: No accessory muscle  usage or respiratory distress.     Breath sounds: Examination of the right-lower field reveals rhonchi. Examination of the left-lower field reveals rhonchi. Rhonchi (bibasilar coarse crackles, right worse, at the bases) present.  Musculoskeletal:        General: Swelling (trace edema) present.     Cervical back: Neck supple.    Laboratory examination:   Recent Labs    03/27/20 1458 06/23/20 1100 08/17/20 1026 09/09/20 1319 10/28/20 1034  NA 139   < > 139 143 144  K 3.7   < > 3.4* 4.2 4.0  CL 104   < >  96 104 107  CO2 26   < > 28 23 25   GLUCOSE 100*   < > 89 81 88  BUN 17   < > 28* 22 22  CREATININE 0.99   < > 1.56* 0.78 0.86  CALCIUM 7.9*   < > 9.1 9.2 8.7*  GFRNONAA 53*   < > 31* 71 >60  GFRAA >60  --  35* 82  --    < > = values in this interval not displayed.   CrCl cannot be calculated (Patient's most recent lab result is older than the maximum 21 days allowed.).  CMP Latest Ref Rng & Units 10/28/2020 09/09/2020 08/17/2020  Glucose 70 - 99 mg/dL 88 81 89  BUN 8 - 23 mg/dL 22 22 28(H)  Creatinine 0.44 - 1.00 mg/dL 0.86 0.78 1.56(H)  Sodium 135 - 145 mmol/L 144 143 139  Potassium 3.5 - 5.1 mmol/L 4.0 4.2 3.4(L)  Chloride 98 - 111 mmol/L 107 104 96  CO2 22 - 32 mmol/L 25 23 28   Calcium 8.9 - 10.3 mg/dL 8.7(L) 9.2 9.1  Total Protein 6.5 - 8.1 g/dL 6.0(L) - -  Total Bilirubin 0.3 - 1.2 mg/dL 0.7 - -  Alkaline Phos 38 - 126 U/L 61 - -  AST 15 - 41 U/L 13(L) - -  ALT 0 - 44 U/L 18 - -   CBC Latest Ref Rng & Units 10/28/2020 09/09/2020 08/17/2020  WBC 4.0 - 10.5 K/uL 7.2 7.0 6.4  Hemoglobin 12.0 - 15.0 g/dL 11.4(L) 10.7(L) 11.4  Hematocrit 36.0 - 46.0 % 37.1 34.5 36.9  Platelets 150 - 400 K/uL 215 213 222   Lipid Panel     Component Value Date/Time   CHOL 142 07/31/2018 0655   TRIG 72 07/31/2018 0655   HDL 45 07/31/2018 0655   CHOLHDL 3.2 07/31/2018 0655   VLDL 14 07/31/2018 0655   LDLCALC 83 07/31/2018 0655   HEMOGLOBIN A1C Lab Results  Component Value Date   HGBA1C  5.0 07/31/2018   MPG 96.8 07/31/2018   TSH Recent Labs    03/21/20 2329  TSH 5.095*   BNP (last 3 results) Recent Labs    03/21/20 2116 08/17/20 1026 09/09/20 1320  BNP 1,091.1* 327.1* 1,136.5*    External labs:   Lipid Panel completed 04/09/2019 HDL 38 MG/DL 04/09/2019 LDL 80.000 mg 04/09/2019 Cholesterol, total 143.000 m 04/09/2019 Triglycerides 127.000 04/09/2019  A1C 5.000 07/31/2018  Glucose Random 110.000 m 11/04/2019 MicroAlbumin Urine 9.000 04/25/2019 MicroAlbumin/Creat 16.8 MG/ 04/25/2019 BUN 18.000 mg 11/04/2019 Creatinine, Serum 0.700 mg/ 11/04/2019  TSH 2.620 micr 11/04/2019  Medications and allergies   Allergies  Allergen Reactions  . Codeine Nausea Only  . Epinephrine     Tachcardyia, chest pains tremors  . Tenormin [Atenolol] Rash    Outpatient Medications Prior to Visit  Medication Sig Dispense Refill  . acetaminophen (TYLENOL) 500 MG tablet Take 1,000 mg by mouth 3 (three) times daily as needed for moderate pain.     Marland Kitchen amiodarone (PACERONE) 200 MG tablet TAKE 1 TABLET BY MOUTH EVERY DAY 90 tablet 1  . amLODipine (NORVASC) 5 MG tablet     . furosemide (LASIX) 40 MG tablet Take 1 tablet (40 mg total) by mouth as directed. Daily. Take twice daily starting 08/13/20 through 08/17/20 (Patient taking differently: Take 40 mg by mouth as directed. Once every 3 days. Take extra if leg swelling) 90 tablet 1  . levothyroxine (SYNTHROID, LEVOTHROID) 112 MCG tablet Take 112-168 mcg by mouth daily before  breakfast. Take 1 tablet (112 mcg) on Mondays, Tuesdays, Wednesdays, Thursdays, Fridays & Saturdays. Take 1.5 tablet (168 mcg) on Sundays    . losartan (COZAAR) 25 MG tablet Take 25 mg by mouth daily.    . metoprolol succinate (TOPROL-XL) 50 MG 24 hr tablet Take 1 tablet (50 mg total) by mouth daily. (Patient taking differently: Take 25 mg by mouth daily.) 90 tablet 1  . pantoprazole (PROTONIX) 40 MG tablet Take 40 mg by mouth daily as needed (indigestion/heartburn.).      Marland Kitchen potassium chloride (KLOR-CON) 10 MEQ tablet Take 1 tablet (10 mEq total) by mouth daily as needed. With Furosemide only 60 tablet 1  . Ferrous Sulfate (IRON) 325 (65 Fe) MG TABS     . potassium chloride (KLOR-CON) 10 MEQ tablet TAKE 1 TABLET BY MOUTH 2 TIMES A DAY 60 tablet 0   No facility-administered medications prior to visit.    Radiology:   No results found.  Cardiac Studies:   Lexiscan Myoview stress test  04/23/2018: 1. The resting electrocardiogram demonstrated normal sinus rhythm, normal resting conduction and no resting arrhythmias.  Stress EKG is non-diagnostic for ischemia as it a pharmacologic stress using Lexiscan. Occasional PAC and PVC. The patient developed significant symptoms which included Chest Pain, Stomach pain, Headache.  2. The overall quality of the study is good. There is no evidence of abnormal lung activity. Stress and rest SPECT images demonstrate homogeneous tracer distribution throughout the myocardium. Gated SPECT imaging reveals diffuse global hypokinesis. The left ventricular ejection fraction was markedly depressed at (26%).   3. This is a high risk study due to severe LV systolic dysfunction. Findings may represent non ischemic cardiomyopathy.  Echocardiogram 07/23/2020: Left ventricle cavity is normal in size and wall thickness. Severe global hypokinesis with visual LVEF 20-25%.  Doppler evidence of grade II (pseudonormal) diastolic dysfunction, elevated LAP.  The aortic root is mildly dilated at 3.9 cm. Left atrial cavity is moderately dilated. Trileaflet aortic valve.  Moderate (Grade III) aortic regurgitation. Structurally normal mitral valve.  Moderate (Grade III) mitral regurgitation. Mild tricuspid regurgitation.  No evidence of pulmonary hypertension. Compared to previous study on 03/10/2021, LVEF is marginally improved from 15-20%. Mild pulmonary hypertension is now absent.     EKG    EKG 07/14/2020: Normal sinus rhythm with rate of  77 bpm, left axis deviation, left intrafascicular block.  Poor R wave progression, cannot exclude anteroseptal infarct old.  Normal QT interval.  Single PAC.  No change from 01/10/2020.  EKG 03/25/2020: Atrial fibrillation with rapid ventricular response at the rate of 140 bpm, left axis deviation, left intrafascicular block.  Poor R wave progression, cannot exclude anteroseptal infarct old.  Nonspecific ST-T abnormality, cannot exclude lateral ischemia.   Assessment     ICD-10-CM   1. Chronic systolic heart failure (HCC)  I50.22 PCV ECHOCARDIOGRAM COMPLETE  2. Nonischemic cardiomyopathy (HCC)  I42.8 PCV ECHOCARDIOGRAM COMPLETE  3. Orthostatic hypotension  I95.1   4. Supine hypertension  I10   5. Paroxysmal atrial fibrillation (HCC)  I48.0 PCV ECHOCARDIOGRAM COMPLETE    CHA2DS2-VASc Score is 5.  Yearly risk of stroke: 7.2% (A, F, HTN, CHF).  Score of 1=0.6; 2=2.2; 3=3.2; 4=4.8; 5=7.2; 6=9.8; 7=>9.8) -(CHF; HTN; vasc disease DM,  Female = 1; Age <65 =0; 65-74 = 1,  >75 =2; stroke/embolism= 2).   No orders of the defined types were placed in this encounter.   Medications Discontinued During This Encounter  Medication Reason  . potassium chloride (KLOR-CON) 10 MEQ  tablet Error  . Ferrous Sulfate (IRON) 325 (65 Fe) MG TABS Error    Orders Placed This Encounter  Procedures  . PCV ECHOCARDIOGRAM COMPLETE    Standing Status:   Future    Standing Expiration Date:   12/16/2021    Recommendations:   UNITA DETAMORE  is a 85 y.o. female  with bilateral hip replacement and knee replacement and left breast lumpectomy in 2018 without chemotherapy or radiation therapy for breast cancer and also remote hysterectomy due to uterine cancer in 1991, supine hypertension and orthostatic hypotension, paroxysmal atrial fibrillation on chronic Eliquis, chronic dizziness on standing. Has not been able to tolerate any BP medications due to severe dizzy spells.  She also has history of A. fib with RVR SP  direct-current cardioversion due to acute decompensated heart failure with severely systolic dysfunction on 12/30/1243 and also GI bleed at the time.  She is now tolerating Eliquis.    She has iron studies pending and is now following hematology.  Continue Eliquis in view of high cardioembolic risk.  Her hemoglobin has gradually risen.  Patient is presently doing well and has not had any recent hospitalizations, worsening dyspnea or worsening leg edema.  She needs to have a repeat echocardiogram to reevaluate her LV systolic function.  She wanted to discontinue her medications stating that it makes her tired.  I have reinforced that she is on minimal medications, as she has done well and has remained stable without acute decompensated heart failure, advised her to continue the medications.  Also encouraged her to use oxygen at home on a regular basis.  She had discontinued using this due to throat irritation.  Advised her to use a humidifier.  Blood pressure is again elevated however she is extremely skeptical of starting any new medication. She also has orthostatic hypotension making treatment of her heart failure and hypertension extremely difficult place she has multiple medication intolerances as well.      Adrian Prows, MD, [facility] 12/16/2020, 10:01 PM Office: [phone]

## 2020-12-23 DIAGNOSIS — I110 Hypertensive heart disease with heart failure: Secondary | ICD-10-CM | POA: Diagnosis not present

## 2020-12-26 DIAGNOSIS — I5032 Chronic diastolic (congestive) heart failure: Secondary | ICD-10-CM | POA: Diagnosis not present

## 2020-12-26 DIAGNOSIS — I10 Essential (primary) hypertension: Secondary | ICD-10-CM | POA: Diagnosis not present

## 2020-12-26 DIAGNOSIS — I480 Paroxysmal atrial fibrillation: Secondary | ICD-10-CM | POA: Diagnosis not present

## 2020-12-28 DIAGNOSIS — M545 Low back pain, unspecified: Secondary | ICD-10-CM | POA: Diagnosis not present

## 2020-12-28 DIAGNOSIS — M7061 Trochanteric bursitis, right hip: Secondary | ICD-10-CM | POA: Diagnosis not present

## 2020-12-28 DIAGNOSIS — M7062 Trochanteric bursitis, left hip: Secondary | ICD-10-CM | POA: Diagnosis not present

## 2020-12-28 DIAGNOSIS — M25552 Pain in left hip: Secondary | ICD-10-CM | POA: Diagnosis not present

## 2021-01-07 ENCOUNTER — Ambulatory Visit: Payer: PPO | Admitting: Podiatry

## 2021-01-18 ENCOUNTER — Ambulatory Visit: Payer: PPO | Admitting: Podiatry

## 2021-01-18 ENCOUNTER — Other Ambulatory Visit: Payer: Self-pay

## 2021-01-18 DIAGNOSIS — M79676 Pain in unspecified toe(s): Secondary | ICD-10-CM | POA: Diagnosis not present

## 2021-01-18 DIAGNOSIS — L600 Ingrowing nail: Secondary | ICD-10-CM

## 2021-01-18 NOTE — Progress Notes (Signed)
  Subjective:  Patient ID: REGINALD MANGELS, female    DOB: 04-15-1936,  MRN: [id number]  No chief complaint on file.  85 y.o. female presents with the above complaint. History confirmed with patient. Reports infection to the left great toe, states it just started draining. Has been soaking. Hurts with shoes.  Objective:  Physical Exam: warm, good capillary refill, no trophic changes or ulcerative lesions, normal DP and PT pulses, and normal sensory exam.  Painful ingrowing nail at  medial border of the left, hallux; local warmth noted and sanguinous drainage noted  Assessment:   1. Ingrown nail   2. Pain around toenail    Plan:  Patient was evaluated and treated and all questions answered.  Ingrown Nail, left -Patient elects to proceed with ingrown toenail removal today -Palliative debridement of ingrowing nails in slant-back fashion to patient relief. Nail appears to be cut poorly with missed portion leading to ingrown nail.  No follow-ups on file.

## 2021-01-20 ENCOUNTER — Other Ambulatory Visit: Payer: Self-pay | Admitting: Cardiology

## 2021-01-20 DIAGNOSIS — I480 Paroxysmal atrial fibrillation: Secondary | ICD-10-CM

## 2021-01-20 DIAGNOSIS — I5022 Chronic systolic (congestive) heart failure: Secondary | ICD-10-CM

## 2021-01-20 NOTE — Telephone Encounter (Signed)
Refill request

## 2021-01-21 DIAGNOSIS — I110 Hypertensive heart disease with heart failure: Secondary | ICD-10-CM | POA: Diagnosis not present

## 2021-01-25 DIAGNOSIS — I480 Paroxysmal atrial fibrillation: Secondary | ICD-10-CM | POA: Diagnosis not present

## 2021-01-25 DIAGNOSIS — I10 Essential (primary) hypertension: Secondary | ICD-10-CM | POA: Diagnosis not present

## 2021-01-25 DIAGNOSIS — I5032 Chronic diastolic (congestive) heart failure: Secondary | ICD-10-CM | POA: Diagnosis not present

## 2021-01-25 NOTE — Progress Notes (Signed)
Patient Care Team: [name], MD as PCP - General (Internal Medicine)  DIAGNOSIS:    ICD-10-CM   1. Malignant neoplasm of upper-inner quadrant of left breast in female, estrogen receptor positive ([place])  C50.212    Z17.0       SUMMARY OF ONCOLOGIC HISTORY: Oncology History  Malignant neoplasm of upper-inner quadrant of left breast in female, estrogen receptor positive ([place])  05/15/2017 Initial Diagnosis   Malignant neoplasm of upper-inner quadrant of left breast in female, estrogen receptor positive ([place]): Microscopic focus suspicious for IDC low-grade    05/30/2017 Surgery   Left lumpectomy: IDC grade 1, 0.8 cm, with low-grade DCIS, ER 100%, PR 50%, Ki-67 5%, HER-2 negative ratio 1.5, T1BN0 stage I a     Miscellaneous   Refused radiation and Anti-estrogen therapy      CHIEF COMPLIANT: Follow-up of breast cancer and iron deficiency anemia  INTERVAL HISTORY: Heidi Adams is a 85 y.o. with above-mentioned history of breast cancer treated with lumpectomy, she refused radiation and antiestrogen therapy. She is currently on surveillance. She presents to the clinic today for follow-up.  She reports that she continues to have severe fatigue.  Blood work done today shows normal hemoglobin and normal iron studies.  There is no further evidence of iron deficiency to explain her fatigue.  She has a history of hypothyroidism and and I do not see a TSH level drawn therefore we will draw it today to be added to her labs drawn already.  She uses a wheelchair to get around.  ALLERGIES:  is allergic to codeine, epinephrine, and tenormin [atenolol].  MEDICATIONS:  Current Outpatient Medications  Medication Sig Dispense Refill   amiodarone (PACERONE) 200 MG tablet TAKE 1 TABLET BY MOUTH EVERY DAY 90 tablet 1   losartan (COZAAR) 50 MG tablet TAKE 1 TABLET BY MOUTH EACH NIGHT AT BEDTIME 90 tablet 1   metoprolol succinate (TOPROL-XL) 25 MG 24 hr tablet Take 1 tablet (25 mg total) by mouth daily.  90 tablet 1   acetaminophen (TYLENOL) 500 MG tablet Take 1,000 mg by mouth 3 (three) times daily as needed for moderate pain.      amLODipine (NORVASC) 5 MG tablet      furosemide (LASIX) 40 MG tablet Take 1 tablet (40 mg total) by mouth as directed. Daily. Take twice daily starting 08/13/20 through 08/17/20 (Patient taking differently: Take 40 mg by mouth as directed. Once every 3 days. Take extra if leg swelling) 90 tablet 1   levothyroxine (SYNTHROID, LEVOTHROID) 112 MCG tablet Take 112-168 mcg by mouth daily before breakfast. Take 1 tablet (112 mcg) on Mondays, Tuesdays, Wednesdays, Thursdays, Fridays & Saturdays. Take 1.5 tablet (168 mcg) on Sundays     lidocaine (XYLOCAINE) 1 % (with preservative) injection by Infiltration route.     losartan (COZAAR) 25 MG tablet Take 25 mg by mouth daily.     pantoprazole (PROTONIX) 40 MG tablet Take 40 mg by mouth daily as needed (indigestion/heartburn.).      potassium chloride (KLOR-CON) 10 MEQ tablet Take 1 tablet (10 mEq total) by mouth daily as needed. With Furosemide only 60 tablet 1   triamcinolone acetonide (TRIESENCE) 40 MG/ML SUSP Inject into the articular space.     No current facility-administered medications for this visit.    PHYSICAL EXAMINATION: ECOG PERFORMANCE STATUS: 1 - Symptomatic but completely ambulatory  Vitals:   01/26/21 1053  BP: (!) 172/77  Pulse: 65  Resp: 19  Temp: 97.7 F (36.5 C)  SpO2:  99%   Filed Weights   01/26/21 1053  Weight: 127 lb 3.2 oz (57.7 kg)      LABORATORY DATA:  I have reviewed the data as listed CMP Latest Ref Rng & Units 01/26/2021 10/28/2020 09/09/2020  Glucose 70 - 99 mg/dL 89 88 81  BUN 8 - 23 mg/dL 24(H) 22 22  Creatinine 0.44 - 1.00 mg/dL 0.86 0.86 0.78  Sodium 135 - 145 mmol/L 143 144 143  Potassium 3.5 - 5.1 mmol/L 3.6 4.0 4.2  Chloride 98 - 111 mmol/L 109 107 104  CO2 22 - 32 mmol/L 26 25 23   Calcium 8.9 - 10.3 mg/dL 8.9 8.7(L) 9.2  Total Protein 6.5 - 8.1 g/dL 5.6(L) 6.0(L) -   Total Bilirubin 0.3 - 1.2 mg/dL 0.7 0.7 -  Alkaline Phos 38 - 126 U/L 50 61 -  AST 15 - 41 U/L 19 13(L) -  ALT 0 - 44 U/L 28 18 -    Lab Results  Component Value Date   WBC 7.1 01/26/2021   HGB 12.7 01/26/2021   HCT 39.6 01/26/2021   MCV 92.7 01/26/2021   PLT 172 01/26/2021   NEUTROABS 5.8 01/26/2021    ASSESSMENT & PLAN:  Malignant neoplasm of upper-inner quadrant of left breast in female, estrogen receptor positive ([place]) 05/30/2017: Left lumpectomy: IDC grade 1, 0.8 cm, with low-grade DCIS, ER 100%, PR 50%, Ki-67 5%, HER-2 negative ratio 1.5, T1BN0 stage I a   Did not receive adj XRT Patient also refused antiestrogen therapy   Iron deficiency anemia due to chronic blood loss Complex anemia due to blood loss and also anemia of chronic disease   Malenotic stools with microcytic anemia: Likely GI Bleed. Since 03/20/2020 Follows with gastroenterology with Dr. Cristina Gong Eliquis Discontinued Blood transfusion given 06/23/20 (hospitalization for A.Fib with RVR)   IV iron: Venofer December 2021 Lab review:   10/28/2020: Hemoglobin 11.4, iron saturation 13%, ferritin 116, CMP normal 01/26/2021: Hemoglobin 12.7, iron saturation 19%, ferritin 64, CMP normal No immediate need for IV iron infusion   Severe fatigue: We will add TSH to the labs already drawn.  I will call her with the results of the TSH.   Return to clinic in 6 months for follow-up      No orders of the defined types were placed in this encounter.  The patient has a good understanding of the overall plan. she agrees with it. she will call with any problems that may develop before the next visit here.  Total time spent: 20 mins including face to face time and time spent for planning, charting and coordination of care  Rulon Eisenmenger, MD, MPH 01/26/2021  I, Thana Ates, am acting as scribe for Dr. Nicholas Lose.  I have reviewed the above documentation for accuracy and completeness, and I agree with the  above.

## 2021-01-26 ENCOUNTER — Other Ambulatory Visit: Payer: Self-pay

## 2021-01-26 ENCOUNTER — Inpatient Hospital Stay: Payer: PPO | Admitting: Hematology and Oncology

## 2021-01-26 ENCOUNTER — Telehealth: Payer: Self-pay | Admitting: Hematology and Oncology

## 2021-01-26 ENCOUNTER — Other Ambulatory Visit: Payer: Self-pay | Admitting: Hematology and Oncology

## 2021-01-26 ENCOUNTER — Inpatient Hospital Stay: Payer: PPO | Attending: Hematology and Oncology

## 2021-01-26 VITALS — BP 172/77 | HR 65 | Temp 97.7°F | Resp 19 | Ht 64.0 in | Wt 127.2 lb

## 2021-01-26 DIAGNOSIS — Z170 Estrogen receptor positive status [ER+]: Secondary | ICD-10-CM | POA: Diagnosis not present

## 2021-01-26 DIAGNOSIS — D500 Iron deficiency anemia secondary to blood loss (chronic): Secondary | ICD-10-CM | POA: Insufficient documentation

## 2021-01-26 DIAGNOSIS — R5383 Other fatigue: Secondary | ICD-10-CM

## 2021-01-26 DIAGNOSIS — C50212 Malignant neoplasm of upper-inner quadrant of left female breast: Secondary | ICD-10-CM | POA: Diagnosis not present

## 2021-01-26 DIAGNOSIS — Z79899 Other long term (current) drug therapy: Secondary | ICD-10-CM | POA: Insufficient documentation

## 2021-01-26 DIAGNOSIS — C50512 Malignant neoplasm of lower-outer quadrant of left female breast: Secondary | ICD-10-CM | POA: Diagnosis present

## 2021-01-26 DIAGNOSIS — E039 Hypothyroidism, unspecified: Secondary | ICD-10-CM | POA: Insufficient documentation

## 2021-01-26 LAB — VITAMIN B12: Vitamin B-12: 134 pg/mL — ABNORMAL LOW (ref 180–914)

## 2021-01-26 LAB — IRON AND TIBC
Iron: 54 ug/dL (ref 41–142)
Saturation Ratios: 19 % — ABNORMAL LOW (ref 21–57)
TIBC: 290 ug/dL (ref 236–444)
UIBC: 236 ug/dL (ref 120–384)

## 2021-01-26 LAB — CBC WITH DIFFERENTIAL (CANCER CENTER ONLY)
Abs Immature Granulocytes: 0.03 10*3/uL (ref 0.00–0.07)
Basophils Absolute: 0 10*3/uL (ref 0.0–0.1)
Basophils Relative: 0 %
Eosinophils Absolute: 0.1 10*3/uL (ref 0.0–0.5)
Eosinophils Relative: 1 %
HCT: 39.6 % (ref 36.0–46.0)
Hemoglobin: 12.7 g/dL (ref 12.0–15.0)
Immature Granulocytes: 0 %
Lymphocytes Relative: 12 %
Lymphs Abs: 0.9 10*3/uL (ref 0.7–4.0)
MCH: 29.7 pg (ref 26.0–34.0)
MCHC: 32.1 g/dL (ref 30.0–36.0)
MCV: 92.7 fL (ref 80.0–100.0)
Monocytes Absolute: 0.3 10*3/uL (ref 0.1–1.0)
Monocytes Relative: 5 %
Neutro Abs: 5.8 10*3/uL (ref 1.7–7.7)
Neutrophils Relative %: 82 %
Platelet Count: 172 10*3/uL (ref 150–400)
RBC: 4.27 MIL/uL (ref 3.87–5.11)
RDW: 18.5 % — ABNORMAL HIGH (ref 11.5–15.5)
WBC Count: 7.1 10*3/uL (ref 4.0–10.5)
nRBC: 0 % (ref 0.0–0.2)

## 2021-01-26 LAB — CMP (CANCER CENTER ONLY)
ALT: 28 U/L (ref 0–44)
AST: 19 U/L (ref 15–41)
Albumin: 3.4 g/dL — ABNORMAL LOW (ref 3.5–5.0)
Alkaline Phosphatase: 50 U/L (ref 38–126)
Anion gap: 8 (ref 5–15)
BUN: 24 mg/dL — ABNORMAL HIGH (ref 8–23)
CO2: 26 mmol/L (ref 22–32)
Calcium: 8.9 mg/dL (ref 8.9–10.3)
Chloride: 109 mmol/L (ref 98–111)
Creatinine: 0.86 mg/dL (ref 0.44–1.00)
GFR, Estimated: >60 mL/min (ref >60)
Glucose, Bld: 89 mg/dL (ref 70–99)
Potassium: 3.6 mmol/L (ref 3.5–5.1)
Sodium: 143 mmol/L (ref 135–145)
Total Bilirubin: 0.7 mg/dL (ref 0.3–1.2)
Total Protein: 5.6 g/dL — ABNORMAL LOW (ref 6.5–8.1)

## 2021-01-26 LAB — FERRITIN: Ferritin: 69 ng/mL (ref 11–307)

## 2021-01-26 NOTE — Telephone Encounter (Signed)
Her B12 level is extremely low. I recommended the patient receive B12 injections. However I was not able to get a hold of her.  Her cell phone does not allow [place] to leave any messages.

## 2021-01-26 NOTE — Assessment & Plan Note (Signed)
05/30/2017:Left lumpectomy: IDC grade 1, 0.8 cm, with low-grade DCIS, ER 100%, PR 50%, Ki-67 5%, HER-2 negative ratio 1.5, T1BN0 stage I a  Did not receive adj XRT Patient also refused antiestrogen therapy  Iron deficiency anemia due to chronic blood loss Complex anemia due to blood loss and also anemia of chronic disease  Malenotic stools with microcytic anemia: Likely GI Bleed. Since 03/20/2020 Follows with gastroenterology with Dr. Cristina Gong Eliquis Discontinued Blood transfusion given 06/23/20 (hospitalization for A.Fib with RVR)  IV iron: Venofer December 2021 Lab review:  10/28/2020: Hemoglobin 11.4, iron saturation 13%, ferritin 116, CMP normal 01/26/2021:     Return to clinic in 6 months for follow-up

## 2021-01-27 ENCOUNTER — Telehealth: Payer: Self-pay | Admitting: Hematology and Oncology

## 2021-01-27 LAB — THYROID PANEL WITH TSH
Free Thyroxine Index: 3.2 (ref 1.2–4.9)
T3 Uptake Ratio: 31 % (ref 24–39)
T4, Total: 10.4 ug/dL (ref 4.5–12.0)
TSH: 17.1 u[IU]/mL — ABNORMAL HIGH (ref 0.450–4.500)

## 2021-01-27 NOTE — Telephone Encounter (Signed)
I received the lab work regarding the TSH level.  She is found to be hypothyroid.  I called her with the result but her phone does not allow for any voicemails and that she did not pick up the phone again.  I will forward these labs to her primary care physician.

## 2021-02-05 DIAGNOSIS — I87392 Chronic venous hypertension (idiopathic) with other complications of left lower extremity: Secondary | ICD-10-CM | POA: Diagnosis not present

## 2021-02-05 DIAGNOSIS — I87399 Chronic venous hypertension (idiopathic) with other complications of unspecified lower extremity: Secondary | ICD-10-CM | POA: Diagnosis not present

## 2021-02-05 DIAGNOSIS — I509 Heart failure, unspecified: Secondary | ICD-10-CM | POA: Diagnosis not present

## 2021-02-06 ENCOUNTER — Emergency Department (HOSPITAL_COMMUNITY): Payer: PPO

## 2021-02-06 ENCOUNTER — Other Ambulatory Visit: Payer: Self-pay

## 2021-02-06 ENCOUNTER — Inpatient Hospital Stay (HOSPITAL_COMMUNITY)
Admission: EM | Admit: 2021-02-06 | Discharge: 2021-02-08 | DRG: 291 | Disposition: A | Discharge status: Home / Self Care | Payer: PPO | Attending: Internal Medicine | Admitting: Internal Medicine

## 2021-02-06 ENCOUNTER — Encounter (HOSPITAL_COMMUNITY): Payer: Self-pay

## 2021-02-06 DIAGNOSIS — Z96643 Presence of artificial hip joint, bilateral: Secondary | ICD-10-CM | POA: Diagnosis not present

## 2021-02-06 DIAGNOSIS — Z79899 Other long term (current) drug therapy: Secondary | ICD-10-CM | POA: Diagnosis not present

## 2021-02-06 DIAGNOSIS — Z8673 Personal history of transient ischemic attack (TIA), and cerebral infarction without residual deficits: Secondary | ICD-10-CM | POA: Diagnosis not present

## 2021-02-06 DIAGNOSIS — Z20822 Contact with and (suspected) exposure to covid-19: Secondary | ICD-10-CM | POA: Diagnosis present

## 2021-02-06 DIAGNOSIS — Z853 Personal history of malignant neoplasm of breast: Secondary | ICD-10-CM | POA: Diagnosis not present

## 2021-02-06 DIAGNOSIS — I517 Cardiomegaly: Secondary | ICD-10-CM | POA: Diagnosis not present

## 2021-02-06 DIAGNOSIS — I480 Paroxysmal atrial fibrillation: Secondary | ICD-10-CM | POA: Diagnosis not present

## 2021-02-06 DIAGNOSIS — E876 Hypokalemia: Secondary | ICD-10-CM | POA: Diagnosis not present

## 2021-02-06 DIAGNOSIS — K219 Gastro-esophageal reflux disease without esophagitis: Secondary | ICD-10-CM | POA: Diagnosis present

## 2021-02-06 DIAGNOSIS — I10 Essential (primary) hypertension: Secondary | ICD-10-CM | POA: Diagnosis not present

## 2021-02-06 DIAGNOSIS — I509 Heart failure, unspecified: Secondary | ICD-10-CM

## 2021-02-06 DIAGNOSIS — Z833 Family history of diabetes mellitus: Secondary | ICD-10-CM | POA: Diagnosis not present

## 2021-02-06 DIAGNOSIS — R609 Edema, unspecified: Secondary | ICD-10-CM | POA: Diagnosis not present

## 2021-02-06 DIAGNOSIS — Z888 Allergy status to other drugs, medicaments and biological substances status: Secondary | ICD-10-CM | POA: Diagnosis not present

## 2021-02-06 DIAGNOSIS — E890 Postprocedural hypothyroidism: Secondary | ICD-10-CM | POA: Diagnosis not present

## 2021-02-06 DIAGNOSIS — R54 Age-related physical debility: Secondary | ICD-10-CM | POA: Diagnosis not present

## 2021-02-06 DIAGNOSIS — N179 Acute kidney failure, unspecified: Secondary | ICD-10-CM | POA: Diagnosis not present

## 2021-02-06 DIAGNOSIS — D509 Iron deficiency anemia, unspecified: Secondary | ICD-10-CM | POA: Diagnosis not present

## 2021-02-06 DIAGNOSIS — I110 Hypertensive heart disease with heart failure: Principal | ICD-10-CM | POA: Diagnosis present

## 2021-02-06 DIAGNOSIS — I5023 Acute on chronic systolic (congestive) heart failure: Secondary | ICD-10-CM

## 2021-02-06 DIAGNOSIS — Z823 Family history of stroke: Secondary | ICD-10-CM | POA: Diagnosis not present

## 2021-02-06 DIAGNOSIS — M7989 Other specified soft tissue disorders: Secondary | ICD-10-CM | POA: Diagnosis not present

## 2021-02-06 DIAGNOSIS — Z8249 Family history of ischemic heart disease and other diseases of the circulatory system: Secondary | ICD-10-CM

## 2021-02-06 DIAGNOSIS — Z79890 Hormone replacement therapy (postmenopausal): Secondary | ICD-10-CM

## 2021-02-06 DIAGNOSIS — Z681 Body mass index (BMI) 19 or less, adult: Secondary | ICD-10-CM

## 2021-02-06 DIAGNOSIS — E039 Hypothyroidism, unspecified: Secondary | ICD-10-CM | POA: Diagnosis present

## 2021-02-06 DIAGNOSIS — Z803 Family history of malignant neoplasm of breast: Secondary | ICD-10-CM

## 2021-02-06 DIAGNOSIS — Z885 Allergy status to narcotic agent status: Secondary | ICD-10-CM | POA: Diagnosis not present

## 2021-02-06 DIAGNOSIS — I5043 Acute on chronic combined systolic (congestive) and diastolic (congestive) heart failure: Secondary | ICD-10-CM | POA: Diagnosis present

## 2021-02-06 DIAGNOSIS — I5021 Acute systolic (congestive) heart failure: Secondary | ICD-10-CM | POA: Diagnosis not present

## 2021-02-06 DIAGNOSIS — Z87891 Personal history of nicotine dependence: Secondary | ICD-10-CM

## 2021-02-06 DIAGNOSIS — Z96653 Presence of artificial knee joint, bilateral: Secondary | ICD-10-CM | POA: Diagnosis not present

## 2021-02-06 LAB — COMPREHENSIVE METABOLIC PANEL
ALT: 18 U/L (ref 0–44)
AST: 15 U/L (ref 15–41)
Albumin: 3.5 g/dL (ref 3.5–5.0)
Alkaline Phosphatase: 45 U/L (ref 38–126)
Anion gap: 11 (ref 5–15)
BUN: 30 mg/dL — ABNORMAL HIGH (ref 8–23)
CO2: 27 mmol/L (ref 22–32)
Calcium: 8.8 mg/dL — ABNORMAL LOW (ref 8.9–10.3)
Chloride: 103 mmol/L (ref 98–111)
Creatinine, Ser: 1.08 mg/dL — ABNORMAL HIGH (ref 0.44–1.00)
GFR, Estimated: 51 mL/min — ABNORMAL LOW (ref >60)
Glucose, Bld: 86 mg/dL (ref 70–99)
Potassium: 3.2 mmol/L — ABNORMAL LOW (ref 3.5–5.1)
Sodium: 141 mmol/L (ref 135–145)
Total Bilirubin: 0.9 mg/dL (ref 0.3–1.2)
Total Protein: 6.1 g/dL — ABNORMAL LOW (ref 6.5–8.1)

## 2021-02-06 LAB — RESP PANEL BY RT-PCR (FLU A&B, COVID) ARPGX2
Influenza A by PCR: NEGATIVE
Influenza B by PCR: NEGATIVE
SARS Coronavirus 2 by RT PCR: NEGATIVE

## 2021-02-06 LAB — BRAIN NATRIURETIC PEPTIDE: B Natriuretic Peptide: 1741.5 pg/mL — ABNORMAL HIGH (ref 0.0–100.0)

## 2021-02-06 LAB — CBC WITH DIFFERENTIAL/PLATELET
Abs Immature Granulocytes: 0.03 10*3/uL (ref 0.00–0.07)
Basophils Absolute: 0 10*3/uL (ref 0.0–0.1)
Basophils Relative: 0 %
Eosinophils Absolute: 0 10*3/uL (ref 0.0–0.5)
Eosinophils Relative: 0 %
HCT: 41.4 % (ref 36.0–46.0)
Hemoglobin: 13.2 g/dL (ref 12.0–15.0)
Immature Granulocytes: 0 %
Lymphocytes Relative: 9 %
Lymphs Abs: 0.7 10*3/uL (ref 0.7–4.0)
MCH: 30.2 pg (ref 26.0–34.0)
MCHC: 31.9 g/dL (ref 30.0–36.0)
MCV: 94.7 fL (ref 80.0–100.0)
Monocytes Absolute: 0.3 10*3/uL (ref 0.1–1.0)
Monocytes Relative: 4 %
Neutro Abs: 6.8 10*3/uL (ref 1.7–7.7)
Neutrophils Relative %: 87 %
Platelets: 160 10*3/uL (ref 150–400)
RBC: 4.37 MIL/uL (ref 3.87–5.11)
RDW: 18.6 % — ABNORMAL HIGH (ref 11.5–15.5)
WBC: 7.8 10*3/uL (ref 4.0–10.5)
nRBC: 0 % (ref 0.0–0.2)

## 2021-02-06 IMAGING — CR DG CHEST 2V
2 series · 2 of 2 positions shown · non-contrast
Comparison: 11/20/2019

CLINICAL DATA: Swelling in her legs for the past week

EXAM:
CHEST - 2 VIEW

[w chest lat]
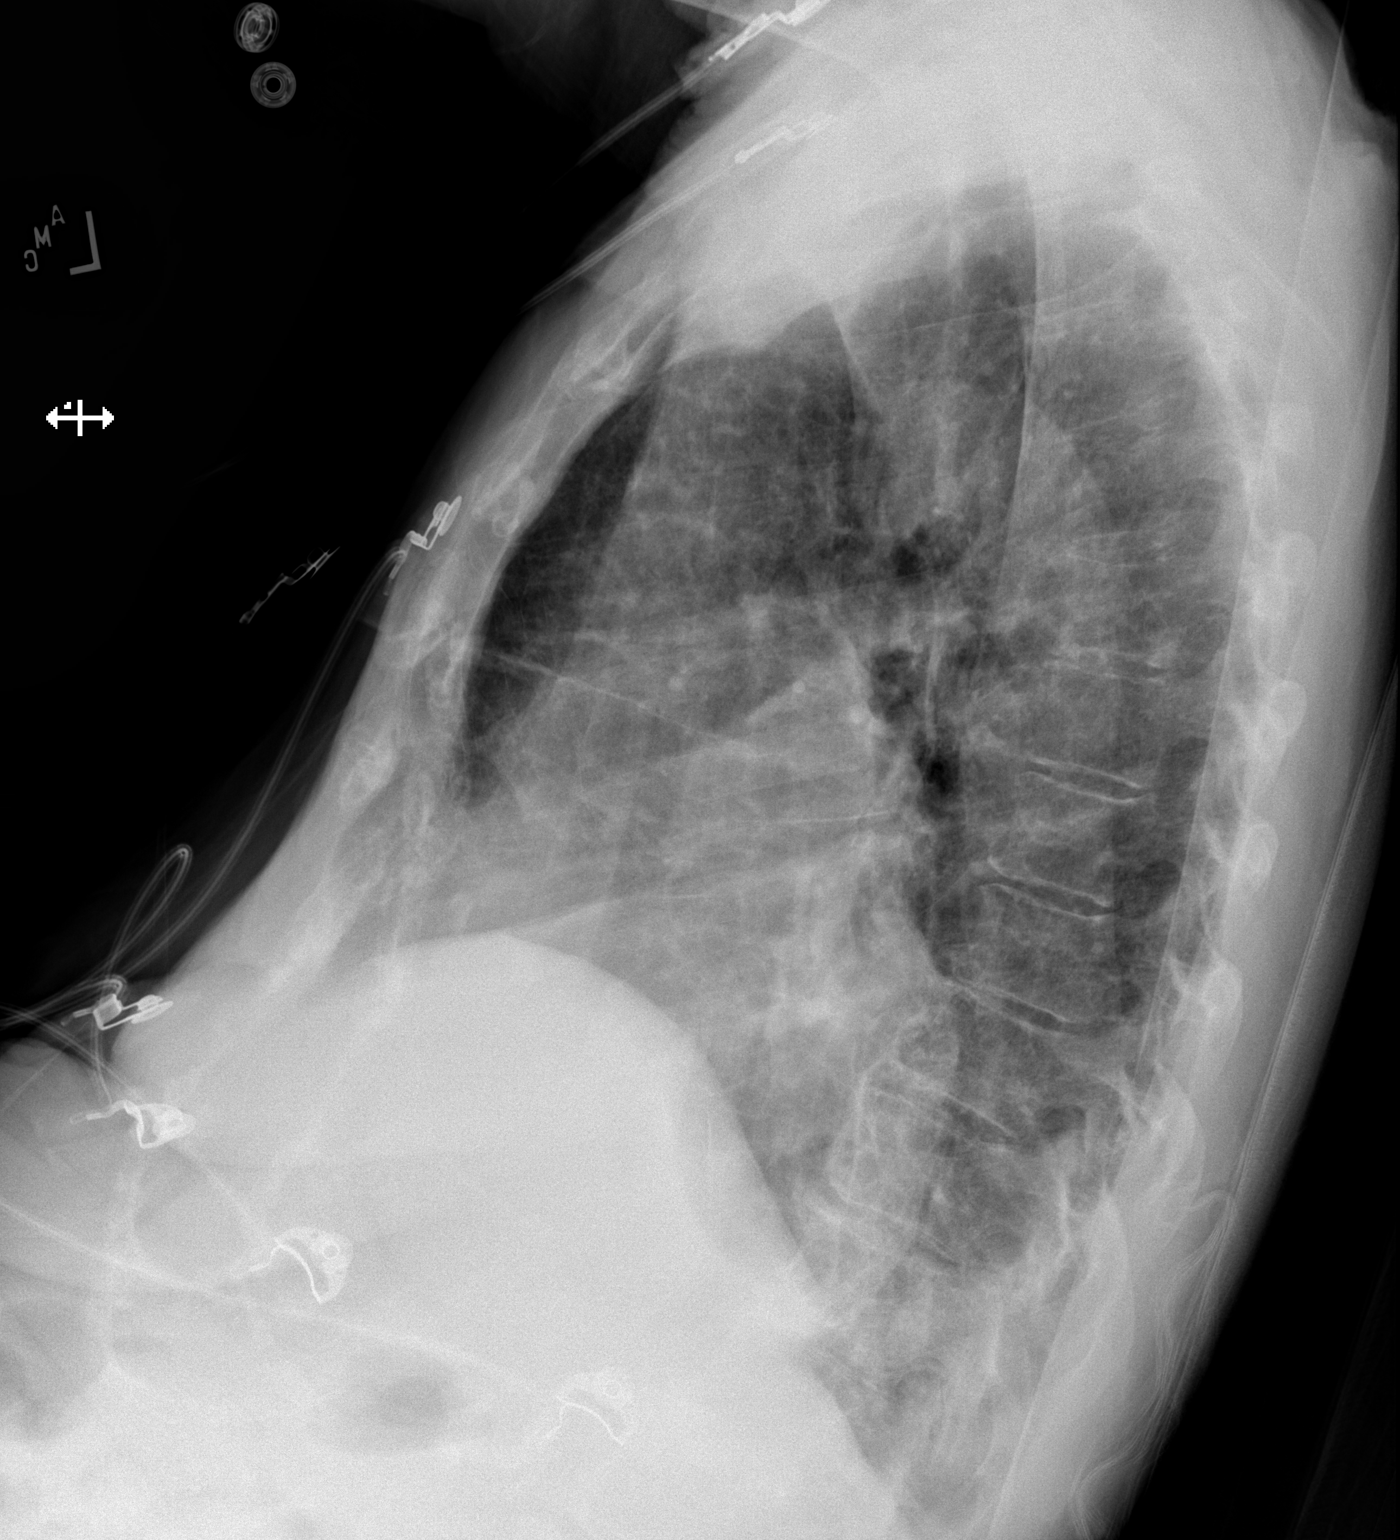

[x chest ap]
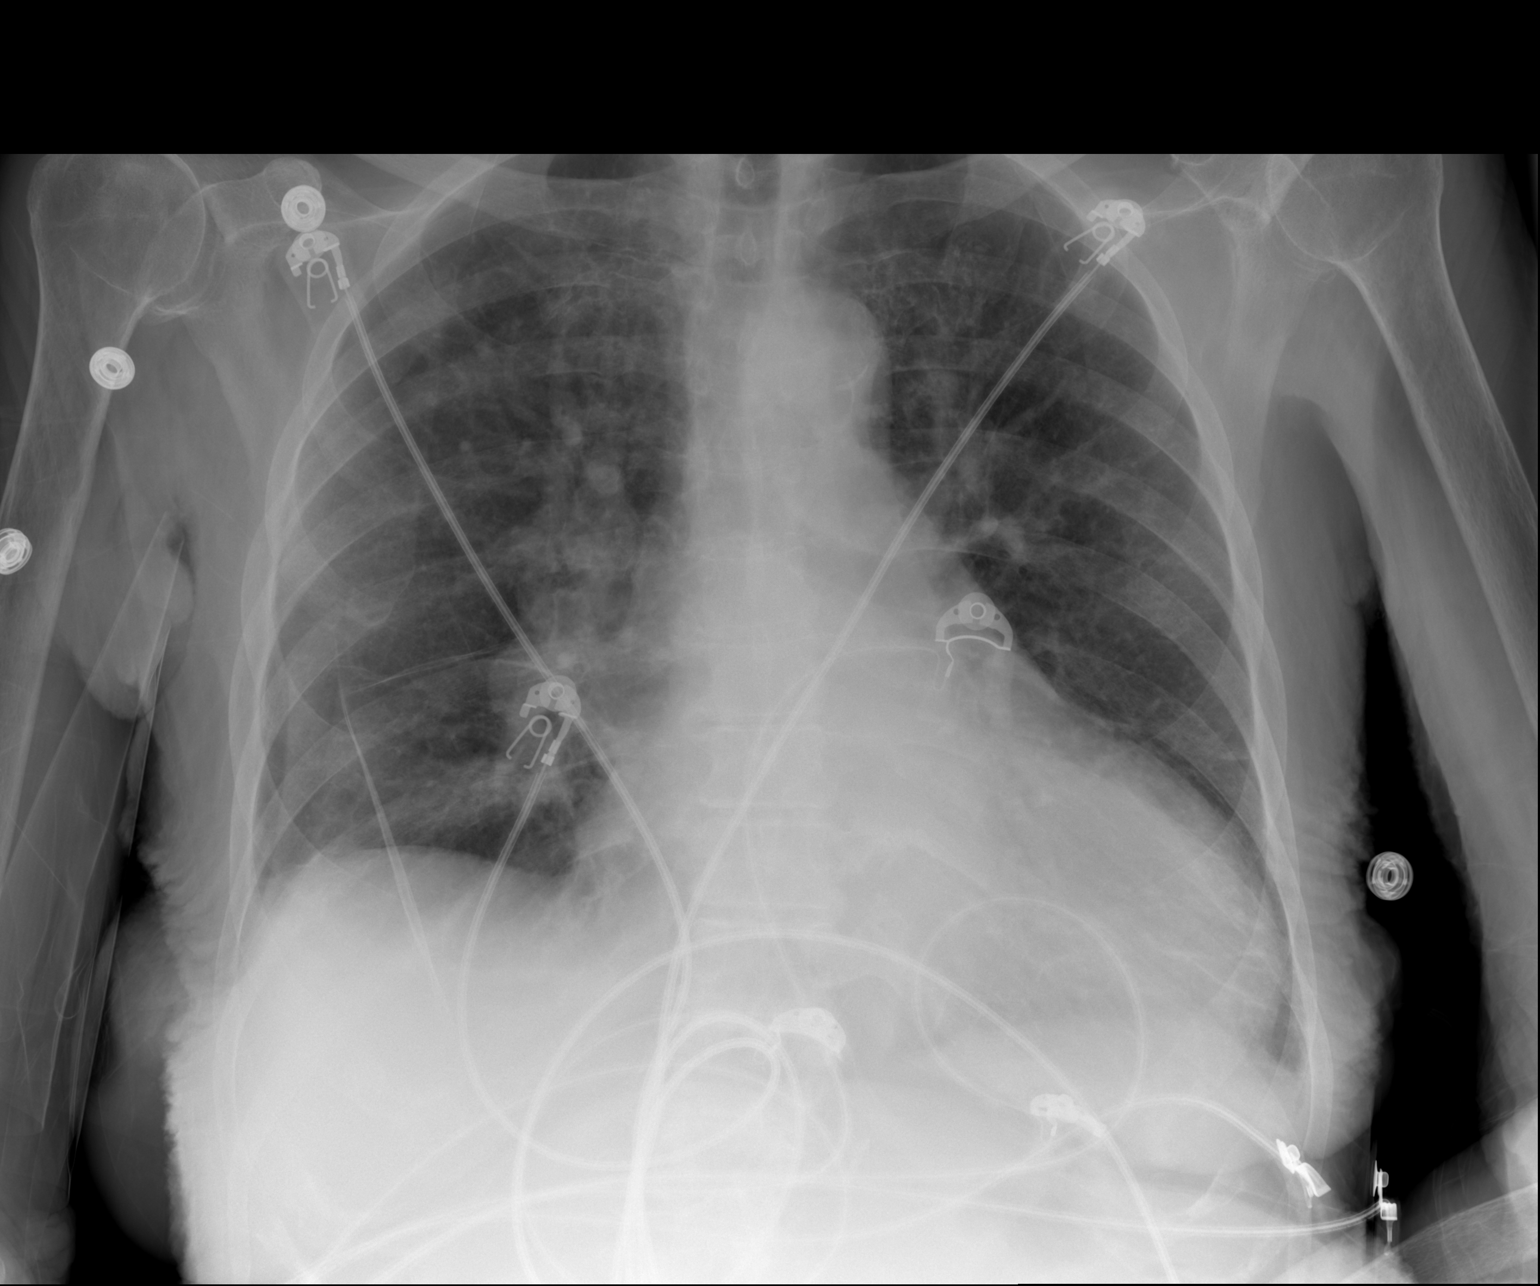

[2 of 2 positions shown; findings below may reference images not displayed]

FINDINGS: Stable cardiomegaly. Mild central venous congestion. No effusion,
infiltrate or pneumothorax. No acute osseous abnormality.
IMPRESSION: Cardiomegaly and central venous congestion.  No change from prior.

## 2021-02-06 MED ORDER — FUROSEMIDE 10 MG/ML IJ SOLN
40.0000 mg | Freq: Two times a day (BID) | INTRAMUSCULAR | Status: DC
  Administered 2021-02-06 – 2021-02-08 (×4): 40 mg via INTRAVENOUS
  Filled 2021-02-06 (×4): qty 4

## 2021-02-06 MED ORDER — LEVOTHYROXINE SODIUM 112 MCG PO TABS: 112.0000 ug | ORAL_TABLET | Freq: Every day | ORAL | Status: DC

## 2021-02-06 MED ORDER — ENOXAPARIN SODIUM 40 MG/0.4ML IJ SOSY
40.0000 mg | PREFILLED_SYRINGE | INTRAMUSCULAR | Status: DC
  Administered 2021-02-06: 40 mg via SUBCUTANEOUS
  Filled 2021-02-06: qty 0.4

## 2021-02-06 MED ORDER — HYDRALAZINE HCL 20 MG/ML IJ SOLN
5.0000 mg | INTRAMUSCULAR | Status: DC | PRN
  Administered 2021-02-07: 5 mg via INTRAVENOUS
  Filled 2021-02-06: qty 1

## 2021-02-06 MED ORDER — METOPROLOL SUCCINATE ER 25 MG PO TB24
25.0000 mg | ORAL_TABLET | Freq: Every day | ORAL | Status: DC
  Administered 2021-02-06 – 2021-02-08 (×3): 25 mg via ORAL
  Filled 2021-02-06 (×3): qty 1

## 2021-02-06 MED ORDER — FUROSEMIDE 10 MG/ML IJ SOLN
40.0000 mg | Freq: Once | INTRAMUSCULAR | Status: AC
  Administered 2021-02-06: 40 mg via INTRAVENOUS
  Filled 2021-02-06: qty 4

## 2021-02-06 MED ORDER — AMIODARONE HCL 200 MG PO TABS
200.0000 mg | ORAL_TABLET | Freq: Every day | ORAL | Status: DC
  Administered 2021-02-06 – 2021-02-08 (×3): 200 mg via ORAL
  Filled 2021-02-06 (×3): qty 1

## 2021-02-06 MED ORDER — ACETAMINOPHEN 650 MG RE SUPP: 650.0000 mg | Freq: Four times a day (QID) | RECTAL | Status: DC | PRN

## 2021-02-06 MED ORDER — POTASSIUM CHLORIDE CRYS ER 20 MEQ PO TBCR: 20.0000 meq | EXTENDED_RELEASE_TABLET | Freq: Every day | ORAL | Status: DC

## 2021-02-06 MED ORDER — LOSARTAN POTASSIUM 25 MG PO TABS: 50.0000 mg | ORAL_TABLET | Freq: Every day | ORAL | Status: DC

## 2021-02-06 MED ORDER — ACETAMINOPHEN 325 MG PO TABS
650.0000 mg | ORAL_TABLET | Freq: Four times a day (QID) | ORAL | Status: DC | PRN
  Administered 2021-02-06: 650 mg via ORAL
  Filled 2021-02-06: qty 2

## 2021-02-06 MED ORDER — PANTOPRAZOLE SODIUM 40 MG PO TBEC: 40.0000 mg | DELAYED_RELEASE_TABLET | Freq: Every day | ORAL | Status: DC | PRN

## 2021-02-06 MED ORDER — LEVOTHYROXINE SODIUM 112 MCG PO TABS
168.0000 ug | ORAL_TABLET | ORAL | Status: DC
  Administered 2021-02-07: 168 ug via ORAL
  Filled 2021-02-06 (×2): qty 2

## 2021-02-06 MED ORDER — POTASSIUM CHLORIDE CRYS ER 20 MEQ PO TBCR
40.0000 meq | EXTENDED_RELEASE_TABLET | Freq: Once | ORAL | Status: AC
  Administered 2021-02-06: 40 meq via ORAL
  Filled 2021-02-06: qty 2

## 2021-02-06 MED ORDER — LEVOTHYROXINE SODIUM 112 MCG PO TABS
112.0000 ug | ORAL_TABLET | ORAL | Status: DC
  Administered 2021-02-08: 112 ug via ORAL

## 2021-02-06 NOTE — ED Notes (Signed)
Pt asked specifically for a purewick. Purewick placed.

## 2021-02-06 NOTE — ED Triage Notes (Signed)
Pt arrived via POV, c/o bilateral leg swelling and weeping x3-4 days. Told to increase lasix and no improvement.

## 2021-02-06 NOTE — ED Provider Notes (Signed)
[facility] DEPT Provider Note   CSN: [id number] Arrival date & time: 02/06/21  0709     History Chief Complaint  Patient presents with   Leg Swelling    Heidi Adams is a 85 y.o. female presenting for evaluation of leg swelling and intermittent SOB.   Patient states of the past week, she has had bilateral leg swelling, worsening each day.  She also reports intermittent shortness of breath, worse with exertion.  She was seen at urgent care yesterday, instructed to take 2 Lasix last night into this morning and come to the ER symptoms not proving.  Patient states she has had increased urination and lost 5 pounds since yesterday, however continues to have bilateral leg swelling.  She is also having weeping of her left leg, and the skin is irritated.  She has been prescribed Lasix to use as needed for shortness of breath, has not needed it recently.  She denies fevers, chills, cough, chest pain, nausea, vomiting, abdominal pain, abnormal bowel movements.  She reports a history of left-sided breast cancer which is under surveillance, but not in remission.  She is not on blood thinners.  Patient lives at home with her husband, does not use any assistive devices when walking  Additional history obtained from chart review.  Patient with a history of anemia, breast cancer, CHF, hypothyroidism, hypertension  HPI     Past Medical History:  Diagnosis Date   Allergy    Anemia    hx of anemia   Arthritis    Breast cancer ([place])    Left breast   Cancer ([place])    skin ca nose   Chronic combined systolic and diastolic CHF (congestive heart failure) ([place]) 09/22/2018   Chronic combined systolic and diastolic CHF (congestive heart failure) ([place]) 09/22/2018   Chronic combined systolic and diastolic CHF (congestive heart failure) ([place]) 09/22/2018   Dysrhythmia    went into Atrial Fibrillation after last surgery, HAS PALPITATIONS   History of skin cancer    Hypertension     Hypothyroidism    PVC (premature ventricular contraction)    Thyroiditis 1973    Patient Active Problem List   Diagnosis Date Noted   Family history of colonic polyps 10/12/2020   Gastroesophageal reflux disease with esophagitis and hemorrhage 10/12/2020   Hemorrhage of rectum and anus 10/12/2020   Slow transit constipation 10/12/2020   Iron deficiency anemia due to chronic blood loss 06/24/2020   E coli bacteremia 03/23/2020   A-fib ([place]) 03/23/2020   Acute cystitis 03/22/2020   Atrial fibrillation with RVR ([place]) 03/22/2020   History of congestive heart failure 03/22/2020   Elevated troponin    Swelling of right foot 10/25/2019   Dizziness 06/22/2019   Arthritis of wrist 05/05/2019   DDD (degenerative disc disease), cervical 08/20/2018   Left elbow pain 08/02/2018   Neck pain 08/02/2018   TIA (transient ischemic attack) 07/30/2018   Hypertensive urgency 07/30/2018   AF (paroxysmal atrial fibrillation) ([place]) 07/30/2018   Arthritis of carpometacarpal ([place]) joint of left thumb 06/20/2018   Pain in thumb joint with movement of left hand 05/08/2018   Pain in finger of right hand 01/02/2018   Pain in left knee 11/08/2017   Conductive hearing loss, bilateral 10/19/2017   Dry nose 10/19/2017   Chronic hip pain after total replacement of left hip joint 08/25/2017   Trochanteric bursitis of left hip 08/25/2017   Localized, primary osteoarthritis of elbow 08/22/2017   Synovitis and tenosynovitis 08/11/2017  Malignant neoplasm of upper-inner quadrant of left breast in female, estrogen receptor positive ([place]) 06/20/2017   S/P closed reduction of dislocated total hip prosthesis 02/03/2017   S/P left THA, AA 01/10/2017   Fatigue 05/31/2016   Overweight (BMI 25.0-29.9) 05/11/2016   S/P right THA, AA 05/10/2016   Right hip pain 03/16/2016   Pes planus 03/16/2016   Abnormality of gait 03/16/2016   PAC (premature atrial contraction) 02/24/2016   PVC (premature ventricular  contraction) 02/24/2016   Palpitations 01/27/2016   Benign paroxysmal positional vertigo of right ear 12/15/2015   Pharyngoesophageal dysphagia 12/15/2015   Hypothyroidism 11/26/2015   Essential hypertension 11/26/2015   S/P left TKA 97/35/3299   Lichen sclerosus 24/26/8341    Past Surgical History:  Procedure Laterality Date   ABDOMINAL HYSTERECTOMY  90   TAH BSO   APPENDECTOMY     BREAST LUMPECTOMY Left 05/30/2017   BREAST LUMPECTOMY WITH RADIOACTIVE SEED LOCALIZATION Left 05/30/2017   Procedure: LEFT BREAST LUMPECTOMY WITH RADIOACTIVE SEED LOCALIZATION;  Surgeon: Excell Seltzer, MD;  Location: [facility];  Service: General;  Laterality: Left;   CATARACT EXTRACTION  09/10/2018   Right eye   CHOLECYSTECTOMY     dr Georgia Lopes   ELBOW SURGERY     EYE SURGERY     "on my eyelids" years ago   KNEE ARTHROSCOPY     left   THYROIDECTOMY  1973   TONSILLECTOMY     TOTAL HIP ARTHROPLASTY Right 05/10/2016   Procedure: TOTAL HIP ARTHROPLASTY ANTERIOR APPROACH;  Surgeon: Paralee Cancel, MD;  Location: [facility];  Service: Orthopedics;  Laterality: Right;   TOTAL HIP ARTHROPLASTY Left 01/10/2017   Procedure: LEFT TOTAL HIP ARTHROPLASTY ANTERIOR APPROACH;  Surgeon: Paralee Cancel, MD;  Location: [facility];  Service: Orthopedics;  Laterality: Left;   TOTAL KNEE ARTHROPLASTY Left 09/28/2015   Procedure: LEFT TOTAL KNEE ARTHROPLASTY;  Surgeon: Paralee Cancel, MD;  Location: [facility];  Service: Orthopedics;  Laterality: Left;   TOTAL KNEE ARTHROPLASTY Right 08/09/2016   Procedure: RIGHT TOTAL KNEE ARTHROPLASTY;  Surgeon: Paralee Cancel, MD;  Location: [facility];  Service: Orthopedics;  Laterality: Right;  Adductor Block   Total Left hip arthroplasty     01/10/17 Dr. Alvan Dame     OB History     Gravida  2   Para  2   Term      Preterm      AB      Living  2      SAB      IAB      Ectopic      Multiple      Live Births              Family History  Problem Relation Age of Onset    Diabetes Father    Hypertension Father    Stroke Father    Breast cancer Cousin    Pneumonia Sister     Social History   Tobacco Use   Smoking status: Former    Packs/day: 1.00    Years: 20.00    Pack years: 20.00    Types: Cigarettes    Quit date: 07/26/1987    Years since quitting: 33.5   Smokeless tobacco: Never  Vaping Use   Vaping Use: Never used  Substance Use Topics   Alcohol use: No    Alcohol/week: 0.0 standard drinks   Drug use: No    Home Medications Prior to Admission medications   Medication Sig Start Date End  Date Taking? Authorizing Provider  amiodarone (PACERONE) 200 MG tablet TAKE 1 TABLET BY MOUTH EVERY DAY 01/20/21   Adrian Prows, MD  losartan (COZAAR) 50 MG tablet TAKE 1 TABLET BY MOUTH EACH NIGHT AT BEDTIME 01/20/21   Adrian Prows, MD  metoprolol succinate (TOPROL-XL) 25 MG 24 hr tablet Take 1 tablet (25 mg total) by mouth daily. 01/20/21   Adrian Prows, MD  acetaminophen (TYLENOL) 500 MG tablet Take 1,000 mg by mouth 3 (three) times daily as needed for moderate pain.     [provider]  amLODipine (NORVASC) 5 MG tablet     [provider]  furosemide (LASIX) 40 MG tablet Take 1 tablet (40 mg total) by mouth as directed. Daily. Take twice daily starting 08/13/20 through 08/17/20 Patient taking differently: Take 40 mg by mouth as directed. Once every 3 days. Take extra if leg swelling 08/13/20 11/11/20  Adrian Prows, MD  levothyroxine Wilmer Floor, LEVOTHROID) 112 MCG tablet Take 112-168 mcg by mouth daily before breakfast. Take 1 tablet (112 mcg) on Mondays, Tuesdays, Wednesdays, Thursdays, Fridays & Saturdays. Take 1.5 tablet (168 mcg) on Sundays    [provider]  lidocaine (XYLOCAINE) 1 % (with preservative) injection by Infiltration route. 12/28/20   [provider]  losartan (COZAAR) 25 MG tablet Take 25 mg by mouth daily.    [provider]  pantoprazole (PROTONIX) 40 MG tablet Take 40 mg by mouth daily as needed  (indigestion/heartburn.).  01/20/16   [provider]  potassium chloride (KLOR-CON) 10 MEQ tablet Take 1 tablet (10 mEq total) by mouth daily as needed. With Furosemide only 10/19/20   Adrian Prows, MD  triamcinolone acetonide (TRIESENCE) 40 MG/ML SUSP Inject into the articular space. 12/28/20   [provider]    Allergies    Codeine, Epinephrine, and Tenormin [atenolol]  Review of Systems   Review of Systems  Respiratory:  Positive for shortness of breath.   Cardiovascular:  Positive for leg swelling.  All other systems reviewed and are negative.  Physical Exam Updated Vital Signs BP (!) 186/97   Pulse 73   Temp 98.2 F (36.8 C) (Oral)   Resp (!) 22   LMP 07/25/1988 Comment: full   SpO2 98%   Physical Exam Vitals and nursing note reviewed.  Constitutional:      General: She is not in acute distress.    Appearance: Normal appearance.     Comments: Resting in the bed in NAD  HENT:     Head: Normocephalic and atraumatic.  Eyes:     Extraocular Movements: Extraocular movements intact.     Conjunctiva/sclera: Conjunctivae normal.     Pupils: Pupils are equal, round, and reactive to light.  Cardiovascular:     Rate and Rhythm: Normal rate and regular rhythm.     Pulses: Normal pulses.  Pulmonary:     Effort: Pulmonary effort is normal. No respiratory distress.     Breath sounds: Normal breath sounds. No wheezing.     Comments: Speaking in full sentences.  Clear lung sounds in all fields.  SPO2 stable on room air. Abdominal:     General: There is no distension.     Palpations: Abdomen is soft. There is no mass.     Tenderness: There is no abdominal tenderness. There is no guarding or rebound.  Musculoskeletal:        General: Normal range of motion.     Cervical back: Normal range of motion and neck supple.  Right lower leg: Edema present.     Left lower leg: Edema present.     Comments: Bilateral pitting edema extending to the knee.  Chronic skin  changes noted of the left leg consistent with venous stasis.  No tenderness palpation over the calf bilaterally.  Skin:    General: Skin is warm and dry.     Capillary Refill: Capillary refill takes less than 2 seconds.  Neurological:     Mental Status: She is alert and oriented to person, place, and time.  Psychiatric:        Mood and Affect: Mood and affect normal.        Speech: Speech normal.        Behavior: Behavior normal.    ED Results / Procedures / Treatments   Labs (all labs ordered are listed, but only abnormal results are displayed) Labs Reviewed  CBC WITH DIFFERENTIAL/PLATELET - Abnormal; Notable for the following components:      Result Value   RDW 18.6 (*)    All other components within normal limits  COMPREHENSIVE METABOLIC PANEL - Abnormal; Notable for the following components:   Potassium 3.2 (*)    BUN 30 (*)    Creatinine, Ser 1.08 (*)    Calcium 8.8 (*)    Total Protein 6.1 (*)    GFR, Estimated 51 (*)    All other components within normal limits  BRAIN NATRIURETIC PEPTIDE - Abnormal; Notable for the following components:   B Natriuretic Peptide 1,741.5 (*)    All other components within normal limits  RESP PANEL BY RT-PCR (FLU A&B, COVID) [id number]    EKG None  Radiology DG Chest 2 View  Result Date: 02/06/2021 CLINICAL DATA:  Swelling in her legs for the past week EXAM: CHEST - 2 VIEW COMPARISON:  11/20/2019 FINDINGS: Stable cardiomegaly. Mild central venous congestion. No effusion, infiltrate or pneumothorax. No acute osseous abnormality. IMPRESSION: Cardiomegaly and central venous congestion.  No change from prior. Electronically Signed   By: Suzy Bouchard M.D.   On: 02/06/2021 09:00    Procedures Procedures   Medications Ordered in ED Medications  furosemide (LASIX) injection 40 mg (has no administration in time range)  potassium chloride SA (KLOR-CON) CR tablet 40 mEq (has no administration in time range)    ED Course  I have reviewed  the triage vital signs and the nursing notes.  Pertinent labs & imaging results that were available during my care of the patient were reviewed by me and considered in my medical decision making (see chart for details).    MDM Rules/Calculators/A&P                          Patient presenting for evaluation of leg swelling and intermittent shortness of breath.  On exam, patient appears nontoxic.  Pulmonary exam is overall reassuring at this time.  However she does have bilateral pitting edema.  Concern for acute on chronic heart failure.  Also consider worsening kidney function.  Less likely DVT, as patient does not have pain and swelling is bilateral.  Will obtain labs, chest x-ray, EKG.  Labs consistent with acute on chronic heart failure with a BNP of 1700.  Creatinine minimally elevated at 1.08 and potassium slightly low at 3.2.  Labs otherwise reassuring.  Chest x-ray viewed and independently interpreted by me, shows cardiomegaly.  Per radiology, there is associated central venous congestion, similar to previous.  Discussed findings with patient and husband.  Discussed my concern for acute on chronic heart failure and recommendation for admission for IV Lasix and close monitoring of kidney function and respiratory status.  Patient and husband are agreeable.  Will call for admission.  Discussed with Dr. Hal Hope from [facility] hospitalist service, patient to be admitted.   Additionally, per chart review, pt's oncologist attempted to notify pt about abnormal TSH and B12 levels. I discussed abnormal values with pt and importance of PCP f/u.   Final Clinical Impression(s) / ED Diagnoses Final diagnoses:  Acute on chronic heart failure, unspecified heart failure type [facility])    Rx / DC Orders ED Discharge Orders     None        Franchot Heidelberg, PA-C 02/06/21 0956    Lacretia Leigh, MD 02/07/21 1510

## 2021-02-06 NOTE — Plan of Care (Signed)
  Problem: Education: Goal: Ability to demonstrate management of disease process will improve Outcome: Progressing   Problem: Clinical Measurements: Goal: Ability to maintain clinical measurements within normal limits will improve Outcome: Progressing Goal: Respiratory complications will improve Outcome: Progressing   Problem: Activity: Goal: Risk for activity intolerance will decrease Outcome: Progressing   Problem: Nutrition: Goal: Adequate nutrition will be maintained Outcome: Progressing

## 2021-02-06 NOTE — H&P (Signed)
History and Physical    Heidi Adams KZL:[id number] DOB: 01/04/1936 DOA: 02/06/2021  PCP: [name], MD  Patient coming from: Home.  Chief Complaint: Shortness of breath.  HPI: Heidi Adams is a 85 y.o. female with history of chronic systolic heart failure last EF measured in December 2021 was 20 to 25% with grade 2 diastolic dysfunction with history of supine hypertension, atrial fibrillation history of GI bleed history of breast cancer being followed by oncologist, hypothyroidism has been experiencing shortness of breath on exertion for the last couple of days with increasing lower extremity edema.  Patient only takes Lasix as needed and has been taking Lasix for the last 2 days despite which her shortness of breath and swelling has not improved and patient called her cardiology office and was instructed to come to the ER.  Denies chest pain productive cough fever or chills.  ED Course: In the ER patient blood pressure is more than 390 systolic chest x-ray shows congestion BNP 1700 creatinine is 1.08 which is increased from 0.86 about 10 days ago.  CBC is unremarkable COVID test is negative.  On exam patient has bilateral lower extremity edema.  Patient was given Lasix 40 mg IV admitted for further management of acute on chronic systolic heart failure.  Review of Systems: As per HPI, rest all negative.   Past Medical History:  Diagnosis Date   Allergy    Anemia    hx of anemia   Arthritis    Breast cancer ([place])    Left breast   Cancer ([place])    skin ca nose   Chronic combined systolic and diastolic CHF (congestive heart failure) ([place]) 09/22/2018   Chronic combined systolic and diastolic CHF (congestive heart failure) ([place]) 09/22/2018   Chronic combined systolic and diastolic CHF (congestive heart failure) ([place]) 09/22/2018   Dysrhythmia    went into Atrial Fibrillation after last surgery, HAS PALPITATIONS   History of skin cancer    Hypertension    Hypothyroidism    PVC  (premature ventricular contraction)    Thyroiditis 1973    Past Surgical History:  Procedure Laterality Date   ABDOMINAL HYSTERECTOMY  89   TAH BSO   APPENDECTOMY     BREAST LUMPECTOMY Left 05/30/2017   BREAST LUMPECTOMY WITH RADIOACTIVE SEED LOCALIZATION Left 05/30/2017   Procedure: LEFT BREAST LUMPECTOMY WITH RADIOACTIVE SEED LOCALIZATION;  Surgeon: Excell Seltzer, MD;  Location: [facility];  Service: General;  Laterality: Left;   CATARACT EXTRACTION  09/10/2018   Right eye   CHOLECYSTECTOMY     dr Georgia Lopes   ELBOW SURGERY     EYE SURGERY     "on my eyelids" years ago   KNEE ARTHROSCOPY     left   THYROIDECTOMY  1973   TONSILLECTOMY     TOTAL HIP ARTHROPLASTY Right 05/10/2016   Procedure: TOTAL HIP ARTHROPLASTY ANTERIOR APPROACH;  Surgeon: Paralee Cancel, MD;  Location: [facility];  Service: Orthopedics;  Laterality: Right;   TOTAL HIP ARTHROPLASTY Left 01/10/2017   Procedure: LEFT TOTAL HIP ARTHROPLASTY ANTERIOR APPROACH;  Surgeon: Paralee Cancel, MD;  Location: [facility];  Service: Orthopedics;  Laterality: Left;   TOTAL KNEE ARTHROPLASTY Left 09/28/2015   Procedure: LEFT TOTAL KNEE ARTHROPLASTY;  Surgeon: Paralee Cancel, MD;  Location: [facility];  Service: Orthopedics;  Laterality: Left;   TOTAL KNEE ARTHROPLASTY Right 08/09/2016   Procedure: RIGHT TOTAL KNEE ARTHROPLASTY;  Surgeon: Paralee Cancel, MD;  Location: [facility];  Service: Orthopedics;  Laterality:  Right;  Adductor Block   Total Left hip arthroplasty     01/10/17 Dr. Alvan Dame     reports that she quit smoking about 33 years ago. Her smoking use included cigarettes. She has a 20.00 pack-year smoking history. She has never used smokeless tobacco. She reports that she does not drink alcohol and does not use drugs.  Allergies  Allergen Reactions   Codeine Nausea Only   Epinephrine     Tachcardyia, chest pains tremors   Tenormin [Atenolol] Rash    Family History  Problem Relation Age of Onset   Diabetes Father     Hypertension Father    Stroke Father    Breast cancer Cousin    Pneumonia Sister     Prior to Admission medications   Medication Sig Start Date End Date Taking? Authorizing Provider  acetaminophen (TYLENOL) 500 MG tablet Take 1,000 mg by mouth 3 (three) times daily as needed for moderate pain.    Yes [provider]  amiodarone (PACERONE) 200 MG tablet TAKE 1 TABLET BY MOUTH EVERY DAY Patient taking differently: Take 200 mg by mouth daily. 01/20/21  Yes Adrian Prows, MD  furosemide (LASIX) 40 MG tablet Take 1 tablet (40 mg total) by mouth as directed. Daily. Take twice daily starting 08/13/20 through 08/17/20 Patient taking differently: Take 40 mg by mouth as directed. Once every 3 days. Take extra if leg swelling 08/13/20 02/06/21 Yes Adrian Prows, MD  ibuprofen (ADVIL) 200 MG tablet Take 600 mg by mouth in the morning.   Yes [provider]  levothyroxine (SYNTHROID, LEVOTHROID) 112 MCG tablet Take 112-168 mcg by mouth daily before breakfast. Take 1 tablet (112 mcg) on Mondays, Tuesdays, Wednesdays, Thursdays, Fridays & Saturdays. Take 1.5 tablet (168 mcg) on Sundays   Yes [provider]  losartan (COZAAR) 50 MG tablet TAKE 1 TABLET BY MOUTH EACH NIGHT AT BEDTIME Patient taking differently: Take 50 mg by mouth daily. 01/20/21  Yes Adrian Prows, MD  metoprolol succinate (TOPROL-XL) 25 MG 24 hr tablet Take 1 tablet (25 mg total) by mouth daily. 01/20/21  Yes Adrian Prows, MD  pantoprazole (PROTONIX) 40 MG tablet Take 40 mg by mouth daily as needed (indigestion/heartburn.).  01/20/16  Yes [provider]  potassium chloride (KLOR-CON) 10 MEQ tablet Take 1 tablet (10 mEq total) by mouth daily as needed. With Furosemide only 10/19/20  Yes Adrian Prows, MD    Physical Exam: Constitutional: Moderately built and nourished. Vitals:   02/06/21 0900 02/06/21 0930 02/06/21 1000 02/06/21 1030  BP: (!) 186/97 (!) 186/93 (!) 184/87 (!) 178/98  Pulse: 73 72 72 72  Resp: (!) 22 (!)  21 17 19   Temp:      TempSrc:      SpO2: 98% 98% 98% 98%   Eyes: Anicteric no pallor. ENMT: No discharge from the ears eyes nose and mouth. Neck: No mass felt.  No neck rigidity.  No JVD appreciated. Respiratory: No rhonchi or crepitations. Cardiovascular: S1-S2 heard. Abdomen: Soft nontender bowel sound present. Musculoskeletal: Bilateral lower extremity edema present. Skin: Chronic skin changes in the lower extremity. Neurologic: Alert awake oriented to time place and person.  Moves all extremities. Psychiatric: Appears normal.  Normal affect.   Labs on Admission: I have personally reviewed following labs and imaging studies  CBC: Recent Labs  Lab 02/06/21 0828  WBC 7.8  NEUTROABS 6.8  HGB 13.2  HCT 41.4  MCV 94.7  PLT 818   Basic Metabolic Panel: Recent Labs  Lab 02/06/21  0828  NA 141  K 3.2*  CL 103  CO2 27  GLUCOSE 86  BUN 30*  CREATININE 1.08*  CALCIUM 8.8*   GFR: Estimated Creatinine Clearance: 34.7 mL/min (A) (by C-G formula based on SCr of 1.08 mg/dL (H)). Liver Function Tests: Recent Labs  Lab 02/06/21 0828  AST 15  ALT 18  ALKPHOS 45  BILITOT 0.9  PROT 6.1*  ALBUMIN 3.5   No results for input(s): LIPASE, AMYLASE in the last 168 hours. No results for input(s): AMMONIA in the last 168 hours. Coagulation Profile: No results for input(s): INR, PROTIME in the last 168 hours. Cardiac Enzymes: No results for input(s): CKTOTAL, CKMB, CKMBINDEX, TROPONINI in the last 168 hours. BNP (last 3 results) No results for input(s): PROBNP in the last 8760 hours. HbA1C: No results for input(s): HGBA1C in the last 72 hours. CBG: No results for input(s): GLUCAP in the last 168 hours. Lipid Profile: No results for input(s): CHOL, HDL, LDLCALC, TRIG, CHOLHDL, LDLDIRECT in the last 72 hours. Thyroid Function Tests: No results for input(s): TSH, T4TOTAL, FREET4, T3FREE, THYROIDAB in the last 72 hours. Anemia Panel: No results for input(s): VITAMINB12,  FOLATE, FERRITIN, TIBC, IRON, RETICCTPCT in the last 72 hours. Urine analysis:    Component Value Date/Time   COLORURINE YELLOW 03/21/2020 2243   APPEARANCEUR CLEAR 03/21/2020 2243   LABSPEC 1.010 03/21/2020 2243   PHURINE 5.5 03/21/2020 2243   GLUCOSEU NEGATIVE 03/21/2020 2243   HGBUR SMALL (A) 03/21/2020 2243   BILIRUBINUR NEGATIVE 03/21/2020 2243   KETONESUR NEGATIVE 03/21/2020 2243   PROTEINUR 100 (A) 03/21/2020 2243   NITRITE NEGATIVE 03/21/2020 2243   LEUKOCYTESUR MODERATE (A) 03/21/2020 2243   Sepsis Labs: @LABRCNTIP (procalcitonin:4,lacticidven:4) ) Recent Results (from the past 240 hour(s))  Resp Panel by RT-PCR (Flu A&B, Covid) Nasopharyngeal Swab     Status: None   Collection Time: 02/06/21 10:26 AM   Specimen: Nasopharyngeal Swab; Nasopharyngeal(NP) swabs in vial transport medium  Result Value Ref Range Status   SARS Coronavirus 2 by RT PCR NEGATIVE NEGATIVE Final    Comment: (NOTE) SARS-CoV-2 target nucleic acids are NOT DETECTED.  The SARS-CoV-2 RNA is generally detectable in upper respiratory specimens during the acute phase of infection. The lowest concentration of SARS-CoV-2 viral copies this assay can detect is 138 copies/mL. A negative result does not preclude SARS-Cov-2 infection and should not be used as the sole basis for treatment or other patient management decisions. A negative result may occur with  improper specimen collection/handling, submission of specimen other than nasopharyngeal swab, presence of viral mutation(s) within the areas targeted by this assay, and inadequate number of viral copies(<138 copies/mL). A negative result must be combined with clinical observations, patient history, and epidemiological information. The expected result is Negative.  Fact Sheet for Patients:  EntrepreneurPulse.com.au  Fact Sheet for Healthcare Providers:  IncredibleEmployment.be  This test is no t yet approved or  cleared by the [place] FDA and  has been authorized for detection and/or diagnosis of SARS-CoV-2 by FDA under an Emergency Use Authorization (EUA). This EUA will remain  in effect (meaning this test can be used) for the duration of the COVID-19 declaration under Section 564(b)(1) of the Act, 21 U.S.C.section 360bbb-3(b)(1), unless the authorization is terminated  or revoked sooner.       Influenza A by PCR NEGATIVE NEGATIVE Final   Influenza B by PCR NEGATIVE NEGATIVE Final    Comment: (NOTE) The Xpert Xpress SARS-CoV-2/FLU/RSV plus assay is intended as an aid in the  diagnosis of influenza from Nasopharyngeal swab specimens and should not be used as a sole basis for treatment. Nasal washings and aspirates are unacceptable for Xpert Xpress SARS-CoV-2/FLU/RSV testing.  Fact Sheet for Patients: EntrepreneurPulse.com.au  Fact Sheet for Healthcare Providers: IncredibleEmployment.be  This test is not yet approved or cleared by the [place] FDA and has been authorized for detection and/or diagnosis of SARS-CoV-2 by FDA under an Emergency Use Authorization (EUA). This EUA will remain in effect (meaning this test can be used) for the duration of the COVID-19 declaration under Section 564(b)(1) of the Act, 21 U.S.C. section 360bbb-3(b)(1), unless the authorization is terminated or revoked.  Performed at [facility], [place] [address] [place], [place] [address]      Radiological Exams on Admission: DG Chest 2 View  Result Date: 02/06/2021 CLINICAL DATA:  Swelling in her legs for the past week EXAM: CHEST - 2 VIEW COMPARISON:  11/20/2019 FINDINGS: Stable cardiomegaly. Mild central venous congestion. No effusion, infiltrate or pneumothorax. No acute osseous abnormality. IMPRESSION: Cardiomegaly and central venous congestion.  No change from prior. Electronically Signed   By: Suzy Bouchard M.D.   On: 02/06/2021 09:00     EKG: Independently reviewed.  Poor tracing.  We will repeat EKG.  No tachycardia noticed.  Assessment/Plan Principal Problem:   Acute systolic CHF (congestive heart failure) (HCC) Active Problems:   Hypothyroidism   Essential hypertension   Acute CHF (congestive heart failure) (HCC)    Acute on chronic systolic heart failure last EF measured in December 2021 was 20 to 25% with grade 2 diastolic dysfunction presently placed on 40 mg IV every 12 closely follow intake output metabolic panel daily weights.  Will check Dopplers of the lower extremity rule out DVT. Uncontrolled hypertension likely contributing to patient's symptoms.  Patient is on metoprolol and presently on IV Lasix.  I am holding off patient's ARB due to worsening renal function compared to recent past.  We will keep patient n.p.o. and IV hydralazine and follow blood pressure trends. History of atrial fibrillation -on amiodarone and beta-blocker.  Patient states she stopped taking Eliquis.  Has had previous episode of GI bleed. Acute renal failure -creatinine is increased from 0.8 about 10 days ago and it is around 1.08 likely due to recent use of Lasix.  Holding ARB for now.  Patient is on Lasix.  Closely follow metabolic panel. Hypothyroidism on Synthroid has recent TSH drawn.  History of breast cancer and B12 and iron deficiency anemia being followed by Dr. Lindi Adie.  Since patient is acute on chronic systolic heart failure with short of breath on minimal exertion with worsening renal function will need close monitoring and inpatient status.   DVT prophylaxis: Lovenox. Code Status: Full code. Family Communication: Discussed with patient. Disposition Plan: Home. Consults called: None. Admission status: Inpatient.   Rise Patience MD [facility] Pager [phone].  If 7PM-7AM, please contact night-coverage [email or web address] Password [facility]  02/06/2021, 12:09 PM

## 2021-02-06 NOTE — ED Notes (Signed)
Pt provided meal tray

## 2021-02-06 NOTE — ED Provider Notes (Signed)
I provided a substantive portion of the care of this patient.  I personally performed the entirety of the medical decision making for this encounter.      85 year old female presents with increasing lower extremity edema worse on the left side.  Also notes increased dyspnea.  History of CHF.  Suspect likely CHF exacerbation.  Will check labs and x-ray and treat appropriately.   Lacretia Leigh, MD 02/06/21 [phone]

## 2021-02-06 NOTE — ED Notes (Signed)
Pt on purewick

## 2021-02-06 NOTE — ED Notes (Signed)
Pt to xray

## 2021-02-07 ENCOUNTER — Inpatient Hospital Stay (HOSPITAL_COMMUNITY): Payer: PPO

## 2021-02-07 DIAGNOSIS — R609 Edema, unspecified: Secondary | ICD-10-CM

## 2021-02-07 DIAGNOSIS — I5021 Acute systolic (congestive) heart failure: Secondary | ICD-10-CM

## 2021-02-07 LAB — CBC
HCT: 38.4 % (ref 36.0–46.0)
Hemoglobin: 12.3 g/dL (ref 12.0–15.0)
MCH: 30.1 pg (ref 26.0–34.0)
MCHC: 32 g/dL (ref 30.0–36.0)
MCV: 93.9 fL (ref 80.0–100.0)
Platelets: 174 10*3/uL (ref 150–400)
RBC: 4.09 MIL/uL (ref 3.87–5.11)
RDW: 18.6 % — ABNORMAL HIGH (ref 11.5–15.5)
WBC: 7.6 10*3/uL (ref 4.0–10.5)
nRBC: 0 % (ref 0.0–0.2)

## 2021-02-07 LAB — BASIC METABOLIC PANEL
Anion gap: 9 (ref 5–15)
BUN: 29 mg/dL — ABNORMAL HIGH (ref 8–23)
CO2: 31 mmol/L (ref 22–32)
Calcium: 8.4 mg/dL — ABNORMAL LOW (ref 8.9–10.3)
Chloride: 99 mmol/L (ref 98–111)
Creatinine, Ser: 1.14 mg/dL — ABNORMAL HIGH (ref 0.44–1.00)
GFR, Estimated: 48 mL/min — ABNORMAL LOW (ref >60)
Glucose, Bld: 87 mg/dL (ref 70–99)
Potassium: 3.1 mmol/L — ABNORMAL LOW (ref 3.5–5.1)
Sodium: 139 mmol/L (ref 135–145)

## 2021-02-07 LAB — MAGNESIUM: Magnesium: 1.5 mg/dL — ABNORMAL LOW (ref 1.7–2.4)

## 2021-02-07 MED ORDER — MAGNESIUM OXIDE -MG SUPPLEMENT 400 (240 MG) MG PO TABS
400.0000 mg | ORAL_TABLET | Freq: Two times a day (BID) | ORAL | Status: AC
  Administered 2021-02-07 (×2): 400 mg via ORAL
  Filled 2021-02-07 (×2): qty 1

## 2021-02-07 MED ORDER — APIXABAN 2.5 MG PO TABS
2.5000 mg | ORAL_TABLET | Freq: Two times a day (BID) | ORAL | Status: DC
  Administered 2021-02-07 – 2021-02-08 (×2): 2.5 mg via ORAL
  Filled 2021-02-07 (×2): qty 1

## 2021-02-07 MED ORDER — POTASSIUM CHLORIDE CRYS ER 20 MEQ PO TBCR
20.0000 meq | EXTENDED_RELEASE_TABLET | Freq: Once | ORAL | Status: AC
  Administered 2021-02-07: 20 meq via ORAL
  Filled 2021-02-07: qty 1

## 2021-02-07 MED ORDER — POTASSIUM CHLORIDE CRYS ER 20 MEQ PO TBCR
40.0000 meq | EXTENDED_RELEASE_TABLET | Freq: Three times a day (TID) | ORAL | Status: AC
  Administered 2021-02-07 (×3): 40 meq via ORAL
  Filled 2021-02-07 (×3): qty 2

## 2021-02-07 MED ORDER — APIXABAN 5 MG PO TABS: 5.0000 mg | ORAL_TABLET | Freq: Two times a day (BID) | ORAL | Status: DC

## 2021-02-07 MED ORDER — POTASSIUM CHLORIDE 10 MEQ/100ML IV SOLN
10.0000 meq | INTRAVENOUS | Status: AC
  Administered 2021-02-07 (×2): 10 meq via INTRAVENOUS
  Filled 2021-02-07 (×2): qty 100

## 2021-02-07 MED ORDER — MAGNESIUM SULFATE 2 GM/50ML IV SOLN
2.0000 g | Freq: Once | INTRAVENOUS | Status: AC
  Administered 2021-02-07: 2 g via INTRAVENOUS
  Filled 2021-02-07: qty 50

## 2021-02-07 NOTE — Evaluation (Signed)
Physical Therapy Evaluation Patient Details Name: Heidi Adams MRN: [id number] DOB: Sep 12, 1935 Today's Date: 02/07/2021   History of Present Illness  Patient admitted for CHF. 85 year old female with a history of systolic CHF with an EF of 20-25% in December 2021 and grade 2 diastolic dysfunction, hypertension, atrial fibrillation, hypothyroidism and breast cancer presenting with dyspnea on exertion for a few days prior to admission.  Clinical Impression  On eval, pt required Min A for mobility. She walked ~45 feet with a RW. Pt presents with general weakness, decreased activity tolerance, and impaired gait and balance. Husband reports pt has fallen at home. Pt does report some dizziness at times. Once up ambulating, she described it as lightheadedness. Discussed d/c plan with pt and husband-plan is for home. Will recommend HHPT f/u.     Follow Up Recommendations Home health PT;Supervision/Assistance - 24 hour    Equipment Recommendations  None recommended by PT    Recommendations for Other Services       Precautions / Restrictions Precautions Precautions: Fall Restrictions Weight Bearing Restrictions: No      Mobility  Bed Mobility Overal bed mobility: Needs Assistance Bed Mobility: Supine to Sit;Sit to Supine     Supine to sit: Min assist Sit to supine: Min assist   General bed mobility comments: Cues and encouragement for pt to do as much as she could for herself. Cues for safety, technique. Pt used bedrail. Assist for trunk and LEs.    Transfers Overall transfer level: Needs assistance Equipment used: Rolling walker (2 wheeled) Transfers: Sit to/from Stand Sit to Stand: Min assist        General transfer comment: Assist to rise, steady. Cues for safety, technique, hand placement.  Ambulation/Gait Ambulation/Gait assistance: Min assist Gait Distance (Feet): 45 Feet Assistive device: Rolling walker (2 wheeled) Gait Pattern/deviations: Step-through  pattern;Decreased stride length     General Gait Details: Assist to stabilize throughout distance and to intermittently maneuver with RW. Pt reports some lightheadedness.  Stairs            Wheelchair Mobility    Modified Rankin (Stroke Patients Only)       Balance Overall balance assessment: Needs assistance;History of Falls Sitting-balance support: No upper extremity supported Sitting balance-Leahy Scale: Good     Standing balance support: Bilateral upper extremity supported Standing balance-Leahy Scale: Poor Standing balance comment: Can take hands off of walker to perform ADLs                             Pertinent Vitals/Pain Pain Assessment: No/denies pain    Home Living Family/patient expects to be discharged to:: (P) Private residence Living Arrangements: (P) Spouse/significant other Available Help at Discharge: (P) Family;Available 24 hours/day Type of Home: (P) House Home Access: (P) Stairs to enter Entrance Stairs-Rails: (P) None Entrance Stairs-Number of Steps: (P) 2 Home Layout: (P) One level Home Equipment: (P) Walker - 2 wheels;Bedside commode;Cane - single point      Prior Function Level of Independence: (P) Needs assistance   Gait / Transfers Assistance Needed: (P) uses cane or walker  ADL's / Homemaking Assistance Needed: (P) has assistance with compression hose getting pants over feet        Hand Dominance        Extremity/Trunk Assessment   Upper Extremity Assessment Upper Extremity Assessment: Generalized weakness    Lower Extremity Assessment Lower Extremity Assessment: Generalized weakness    Cervical / Trunk Assessment Cervical /  Trunk Assessment: Kyphotic  Communication      Cognition Arousal/Alertness: Awake/alert Behavior During Therapy: WFL for tasks assessed/performed Overall Cognitive Status: Within Functional Limits for tasks assessed                                        General  Comments      Exercises     Assessment/Plan    PT Assessment Patient needs continued PT services  PT Problem List Decreased strength;Decreased mobility;Decreased activity tolerance;Decreased knowledge of use of DME       PT Treatment Interventions DME instruction;Gait training;Therapeutic exercise;Balance training;Functional mobility training;Therapeutic activities;Patient/family education    PT Goals (Current goals can be found in the Care Plan section)  Acute Rehab PT Goals Patient Stated Goal: to get stronger PT Goal Formulation: With patient/family Time For Goal Achievement: 02/21/21 Potential to Achieve Goals: Good    Frequency Min 3X/week   Barriers to discharge        Co-evaluation               AM-PAC PT "6 Clicks" Mobility  Outcome Measure Help needed turning from your back to your side while in a flat bed without using bedrails?: A Little Help needed moving from lying on your back to sitting on the side of a flat bed without using bedrails?: A Little Help needed moving to and from a bed to a chair (including a wheelchair)?: A Little Help needed standing up from a chair using your arms (e.g., wheelchair or bedside chair)?: A Little Help needed to walk in hospital room?: A Little Help needed climbing 3-5 steps with a railing? : A Little 6 Click Score: 18    End of Session Equipment Utilized During Treatment: Gait belt Activity Tolerance: Patient tolerated treatment well Patient left: in bed;with call bell/phone within reach;with bed alarm set;with family/visitor present   PT Visit Diagnosis: Muscle weakness (generalized) (M62.81);History of falling (Z91.81);Difficulty in walking, not elsewhere classified (R26.2)    Time: 1443-1540 PT Time Calculation (min) (ACUTE ONLY): 24 min   Charges:   PT Evaluation $PT Eval Moderate Complexity: 1 Mod PT Treatments $Gait Training: 8-22 mins           Doreatha Massed, PT Acute Rehabilitation  Office:  [phone] Pager: [phone]

## 2021-02-07 NOTE — TOC Progression Note (Signed)
Transition of Care [facility]) - Progression Note    Patient Details  Name: Heidi Adams MRN: [id number] Date of Birth: [birth year]  [facility] of [facility]) CM/SW Contact  Gustav Knueppel, Juliann Pulse, RN Phone Number: 02/07/2021, 2:31 PM  Clinical Narrative:Await PT recc.            Expected Discharge Plan and Services                                                 Social Determinants of Health (SDOH) Interventions    Readmission Risk Interventions No flowsheet data found.

## 2021-02-07 NOTE — Evaluation (Signed)
Occupational Therapy Evaluation Patient Details Name: Heidi Adams MRN: [id number] DOB: 12-04-1935 Today's Date: 02/07/2021    History of Present Illness Patient admitted for CHF. 85 year old female with a history of systolic CHF with an EF of 20-25% in December 2021 and grade 2 diastolic dysfunction, hypertension, atrial fibrillation, hypothyroidism and breast cancer presenting with dyspnea on exertion for a few days prior to admission.   Clinical Impression   [name] is an 85 year old woman admitted to hospital with CHF. On evaluation she presents with generalized weakness, decreased activity tolerance and impaired balance. Patient demonstrates the ability to perform in room ambulation and baseline ADLs. Patient reports some difficulty with LB dressing and lifting legs at baseline. Patient exhibits the functional abilities to return home with husband. Therapist initially recommended OP OT and PT at discharge but patient wants to start with home health therapy with the hope to advance to OP therapy. No acute care OT needs at this time.    Follow Up Recommendations  Home health OT    Equipment Recommendations  None recommended by OT    Recommendations for Other Services       Precautions / Restrictions Precautions Precautions: Fall Restrictions Weight Bearing Restrictions: No      Mobility Bed Mobility Overal bed mobility: Needs Assistance Bed Mobility: Supine to Sit;Sit to Supine     Supine to sit: Min assist Sit to supine: Mod assist   General bed mobility comments: Hand hold to transfer into sitting. mod assist for LEs to return to supine. Reports some difficulty with getting out of bed due to softness of mattress. Reports needing assistance to get legs into the car.    Transfers Overall transfer level: Needs assistance Equipment used: Rolling walker (2 wheeled) Transfers: Sit to/from [facility] Sit to Stand: Min guard Stand pivot transfers:  Min guard            Balance Overall balance assessment: Needs assistance Sitting-balance support: No upper extremity supported Sitting balance-Leahy Scale: Good     Standing balance support: During functional activity Standing balance-Leahy Scale: Fair Standing balance comment: Can take hands off of walker to perform ADLs                           ADL either performed or assessed with clinical judgement   ADL Overall ADL's : At baseline                                       General ADL Comments: Overall can perform her ADLs at baseline .needing assistance with LB dressing. Can perform toileting. Can stand at sink to perform grooming.     Vision Patient Visual Report: No change from baseline       Perception     Praxis      Pertinent Vitals/Pain Pain Assessment: No/denies pain     Hand Dominance     Extremity/Trunk Assessment Upper Extremity Assessment Upper Extremity Assessment: Generalized weakness   Lower Extremity Assessment Lower Extremity Assessment: Generalized weakness   Cervical / Trunk Assessment Cervical / Trunk Assessment: Kyphotic   Communication     Cognition Arousal/Alertness: Awake/alert Behavior During Therapy: WFL for tasks assessed/performed Overall Cognitive Status: Within Functional Limits for tasks assessed  General Comments       Exercises     Shoulder Instructions      Home Living Family/patient expects to be discharged to:: (P) Private residence Living Arrangements: (P) Spouse/significant other Available Help at Discharge: (P) Family;Available 24 hours/day Type of Home: (P) House Home Access: (P) Stairs to enter Entrance Stairs-Number of Steps: (P) 2 Entrance Stairs-Rails: (P) None Home Layout: (P) One level     Bathroom Shower/Tub: (P) Walk-in shower   Bathroom Toilet: (P) Standard Bathroom Accessibility: (P) Yes   Home Equipment: (P)  Walker - 2 wheels;Bedside commode;Cane - single point          Prior Functioning/Environment Level of Independence: (P) Needs assistance  Gait / Transfers Assistance Needed: (P) uses cane or walker ADL's / Homemaking Assistance Needed: (P) has assistance with compression hose getting pants over feet            OT Problem List: Decreased strength;Impaired balance (sitting and/or standing);Cardiopulmonary status limiting activity;Increased edema      OT Treatment/Interventions:      OT Goals(Current goals can be found in the care plan section) Acute Rehab OT Goals Patient Stated Goal: to get stronger OT Goal Formulation: All assessment and education complete, DC therapy  OT Frequency:     Barriers to D/C:            Co-evaluation              AM-PAC OT "6 Clicks" Daily Activity     Outcome Measure Help from another person eating meals?: None Help from another person taking care of personal grooming?: None Help from another person toileting, which includes using toliet, bedpan, or urinal?: None Help from another person bathing (including washing, rinsing, drying)?: None Help from another person to put on and taking off regular upper body clothing?: None Help from another person to put on and taking off regular lower body clothing?: A Lot 6 Click Score: 22   End of Session Equipment Utilized During Treatment: Rolling walker Nurse Communication: Mobility status  Activity Tolerance: Patient tolerated treatment well Patient left: in bed;with call bell/phone within reach;with bed alarm set;in chair  OT Visit Diagnosis: Muscle weakness (generalized) (M62.81)                Time: 6283-1517 OT Time Calculation (min): 17 min Charges:  OT General Charges $OT Visit: 1 Visit OT Evaluation $OT Eval Moderate Complexity: 1 Mod  Kelli Egolf, OTR/L [place]  Office [phone] Pager: [phone]   Lenward Chancellor 02/07/2021, 3:02 PM

## 2021-02-07 NOTE — Progress Notes (Signed)
Lower extremity venous bilateral study completed.   Please see CV Proc for preliminary results.   Jarvin Ogren, RDMS, RVT  

## 2021-02-07 NOTE — Progress Notes (Addendum)
PROGRESS NOTE    Heidi Adams  KMQ:[id number] DOB: May 23, 1936 DOA: 02/06/2021 PCP: [name], MD     Brief Narrative:  85 year old female with a history of systolic CHF with an EF of 20-25% in December 2021 and grade 2 diastolic dysfunction, hypertension, atrial fibrillation, hypothyroidism and breast cancer presenting with dyspnea on exertion for a few days prior to admission.  She also had lower extremity edema.  She takes Lasix routinely but that had not been helping with her symptoms so she contacted her cardiology office and they instructed her to seek care in the emergency department.  Chest x-ray showed pulmonary congestion, BNP 1700 and a slight elevation in her creatinine of 1.08 with a baseline around 0.8.  She was started on IV Lasix, COVID testing was negative.   New events last 24 hours / Subjective: Patient reports urinating a lot.  Her breathing and swelling are much better.  She has not gotten up very much since admission.  She is unsure why this happened.  Last week she did have an small handful of potato chips but that was the only change in diet she had.  Lower extremity venous ultrasound preliminarily shows no DVT.  Assessment & Plan:   Principal Problem:   Acute systolic CHF (congestive heart failure) (HCC) Active Problems:   Hypothyroidism   Essential hypertension   Acute CHF (congestive heart failure) (HCC)   Acute combined heart failure: Continue IV Lasix 40 mg twice daily.  Monitor I/O's.  I will have PT by to evaluate her as I think she can probably go home tomorrow or the day after.  She is on room air. Acute kidney injury: Slight creatinine bump was expected with diuresis.  Continue to monitor. Atrial fibrillation: Amiodarone 200 mg daily, metoprolol succinate 25 mg daily; stopped taking Eliquis, reviewing Dr. Irven Shelling 5/25 note, will restart 2.5 mg bid.  Hypokalemia, hypomagnesemia: Monitor and replace, she will likely require supplementation along with her  Lasix Hypothyroidism: Synthroid 112 mcg Monday through Saturday, 168 mcg on Sunday. Hypertension: Hold losartan, continue Toprol-XL 25 mg daily, hydralazine as needed   DVT prophylaxis: Eliquis Code Status: Full Family Communication: Spouse at bedside Coming From: Home Disposition Plan: TBD, having PT/OT come to evaluate her Barriers to Discharge: Clinical improvement  Consultants:  None  Procedures:  None  Objective: Vitals:   02/07/21 0535 02/07/21 0702 02/07/21 0900 02/07/21 0916  BP:  (!) 157/68  (!) 139/52  Pulse:  (!) 55  65  Resp:  20  18  Temp:  97.8 F (36.6 C)  97.7 F (36.5 C)  TempSrc:  Oral  Oral  SpO2:  97%  99%  Weight: 54.6 kg  53.2 kg   Height:        Intake/Output Summary (Last 24 hours) at 02/07/2021 1155 Last data filed at 02/07/2021 0919 Gross per 24 hour  Intake 340.08 ml  Output 1975 ml  Net -1634.92 ml   Filed Weights   02/06/21 1837 02/07/21 0535 02/07/21 0900  Weight: 54.2 kg 54.6 kg 53.2 kg    Examination:  General exam: Appears calm and comfortable  Respiratory system: Faint crackles heard at the bases. Respiratory effort normal. No respiratory distress. No conversational dyspnea.  Cardiovascular system: S1 & S2 heard, RRR. 1+ bilateral pitting edema tapering at the proximal third of the tibia bilaterally Gastrointestinal system: Abdomen is nondistended, soft and nontender. Normal bowel sounds heard. Central nervous system: Alert and oriented. No focal neurological deficits. Speech clear.  Extremities: Symmetric  in appearance  Skin: No rashes, lesions or ulcers on exposed skin  Psychiatry: Judgement and insight appear normal. Mood & affect appropriate.   Data Reviewed: I have personally reviewed following labs and imaging studies  CBC: Recent Labs  Lab 02/06/21 0828 02/07/21 0357  WBC 7.8 7.6  NEUTROABS 6.8  --   HGB 13.2 12.3  HCT 41.4 38.4  MCV 94.7 93.9  PLT 160 591   Basic Metabolic Panel: Recent Labs  Lab  02/06/21 0828 02/07/21 0357  NA 141 139  K 3.2* 3.1*  CL 103 99  CO2 27 31  GLUCOSE 86 87  BUN 30* 29*  CREATININE 1.08* 1.14*  CALCIUM 8.8* 8.4*  MG  --  1.5*   GFR: Estimated Creatinine Clearance: 32 mL/min (A) (by C-G formula based on SCr of 1.14 mg/dL (H)).  Liver Function Tests: Recent Labs  Lab 02/06/21 0828  AST 15  ALT 18  ALKPHOS 45  BILITOT 0.9  PROT 6.1*  ALBUMIN 3.5    Recent Results (from the past 240 hour(s))  Resp Panel by RT-PCR (Flu A&B, Covid) Nasopharyngeal Swab     Status: None   Collection Time: 02/06/21 10:26 AM   Specimen: Nasopharyngeal Swab; Nasopharyngeal(NP) swabs in vial transport medium  Result Value Ref Range Status   SARS Coronavirus 2 by RT PCR NEGATIVE NEGATIVE Final    Comment: (NOTE) SARS-CoV-2 target nucleic acids are NOT DETECTED.  The SARS-CoV-2 RNA is generally detectable in upper respiratory specimens during the acute phase of infection. The lowest concentration of SARS-CoV-2 viral copies this assay can detect is 138 copies/mL. A negative result does not preclude SARS-Cov-2 infection and should not be used as the sole basis for treatment or other patient management decisions. A negative result may occur with  improper specimen collection/handling, submission of specimen other than nasopharyngeal swab, presence of viral mutation(s) within the areas targeted by this assay, and inadequate number of viral copies(<138 copies/mL). A negative result must be combined with clinical observations, patient history, and epidemiological information. The expected result is Negative.  Fact Sheet for Patients:  EntrepreneurPulse.com.au  Fact Sheet for Healthcare Providers:  IncredibleEmployment.be  This test is no t yet approved or cleared by the [place] FDA and  has been authorized for detection and/or diagnosis of SARS-CoV-2 by FDA under an Emergency Use Authorization (EUA). This EUA will  remain  in effect (meaning this test can be used) for the duration of the COVID-19 declaration under Section 564(b)(1) of the Act, 21 U.S.C.section 360bbb-3(b)(1), unless the authorization is terminated  or revoked sooner.       Influenza A by PCR NEGATIVE NEGATIVE Final   Influenza B by PCR NEGATIVE NEGATIVE Final    Comment: (NOTE) The Xpert Xpress SARS-CoV-2/FLU/RSV plus assay is intended as an aid in the diagnosis of influenza from Nasopharyngeal swab specimens and should not be used as a sole basis for treatment. Nasal washings and aspirates are unacceptable for Xpert Xpress SARS-CoV-2/FLU/RSV testing.  Fact Sheet for Patients: EntrepreneurPulse.com.au  Fact Sheet for Healthcare Providers: IncredibleEmployment.be  This test is not yet approved or cleared by the [place] FDA and has been authorized for detection and/or diagnosis of SARS-CoV-2 by FDA under an Emergency Use Authorization (EUA). This EUA will remain in effect (meaning this test can be used) for the duration of the COVID-19 declaration under Section 564(b)(1) of the Act, 21 U.S.C. section 360bbb-3(b)(1), unless the authorization is terminated or revoked.  Performed at [facility], [place]  [address], [place], [place] [address]       Radiology Studies: DG Chest 2 View  Result Date: 02/06/2021 CLINICAL DATA:  Swelling in her legs for the past week EXAM: CHEST - 2 VIEW COMPARISON:  11/20/2019 FINDINGS: Stable cardiomegaly. Mild central venous congestion. No effusion, infiltrate or pneumothorax. No acute osseous abnormality. IMPRESSION: Cardiomegaly and central venous congestion.  No change from prior. Electronically Signed   By: Suzy Bouchard M.D.   On: 02/06/2021 09:00   VAS [place] LOWER EXTREMITY VENOUS (DVT)  Result Date: 02/07/2021  Lower Venous DVT Study Patient Name:  Heidi Adams  Date of Exam:   02/07/2021 Medical Rec #: [id number]      Accession  #:    [id number] Date of Birth: Aug 06, 1935      Patient Gender: F Patient Age:   84Y Exam Location:  [facility] Procedure:      VAS [place] LOWER EXTREMITY VENOUS (DVT) Referring Phys: [id number] Patience --------------------------------------------------------------------------------  Indications: Edema, CHF.  Comparison Study: No prior studies. Performing Technologist: Darlin Coco RDMS,RVT  Examination Guidelines: A complete evaluation includes B-mode imaging, spectral Doppler, color Doppler, and power Doppler as needed of all accessible portions of each vessel. Bilateral testing is considered an integral part of a complete examination. Limited examinations for reoccurring indications may be performed as noted. The reflux portion of the exam is performed with the patient in reverse Trendelenburg.  +---------+---------------+---------+-----------+----------+--------------+ RIGHT    CompressibilityPhasicitySpontaneityPropertiesThrombus Aging +---------+---------------+---------+-----------+----------+--------------+ CFV      Full           Yes      Yes                                 +---------+---------------+---------+-----------+----------+--------------+ SFJ      Full                                                        +---------+---------------+---------+-----------+----------+--------------+ FV Prox  Full                                                        +---------+---------------+---------+-----------+----------+--------------+ FV Mid   Full                                                        +---------+---------------+---------+-----------+----------+--------------+ FV DistalFull                                                        +---------+---------------+---------+-----------+----------+--------------+ PFV      Full                                                         +---------+---------------+---------+-----------+----------+--------------+  POP      Full           Yes      Yes                                 +---------+---------------+---------+-----------+----------+--------------+ PTV      Full                                                        +---------+---------------+---------+-----------+----------+--------------+ PERO     Full                                                        +---------+---------------+---------+-----------+----------+--------------+   +---------+---------------+---------+-----------+----------+--------------+ LEFT     CompressibilityPhasicitySpontaneityPropertiesThrombus Aging +---------+---------------+---------+-----------+----------+--------------+ CFV      Full           Yes      Yes                                 +---------+---------------+---------+-----------+----------+--------------+ SFJ      Full                                                        +---------+---------------+---------+-----------+----------+--------------+ FV Prox  Full                                                        +---------+---------------+---------+-----------+----------+--------------+ FV Mid   Full                                                        +---------+---------------+---------+-----------+----------+--------------+ FV DistalFull                                                        +---------+---------------+---------+-----------+----------+--------------+ PFV      Full                                                        +---------+---------------+---------+-----------+----------+--------------+ POP      Full           Yes      Yes                                 +---------+---------------+---------+-----------+----------+--------------+  PTV      Full                                                         +---------+---------------+---------+-----------+----------+--------------+ PERO     Full                                                        +---------+---------------+---------+-----------+----------+--------------+    Summary: RIGHT: - There is no evidence of deep vein thrombosis in the lower extremity.  - No cystic structure found in the popliteal fossa.  LEFT: - There is no evidence of deep vein thrombosis in the lower extremity.  - No cystic structure found in the popliteal fossa.  *See table(s) above for measurements and observations.    Preliminary      Scheduled Meds:  amiodarone  200 mg Oral Daily   enoxaparin (LOVENOX) injection  40 mg Subcutaneous Q24H   furosemide  40 mg Intravenous BID   [START ON 02/08/2021] levothyroxine  112 mcg Oral Once per day on Mon Tue Wed Thu Fri Sat   levothyroxine  168 mcg Oral Q Sun   magnesium oxide  400 mg Oral BID   metoprolol succinate  25 mg Oral Daily   potassium chloride  40 mEq Oral TID   Time spent: 35 minutes   Shelda Pal, DO [facility] 02/07/2021, 11:55 AM   Available via Epic secure chat 7am-7pm After these hours, please refer to coverage provider listed on [email or web address]

## 2021-02-08 ENCOUNTER — Telehealth: Payer: Self-pay

## 2021-02-08 LAB — CBC
HCT: 40.3 % (ref 36.0–46.0)
Hemoglobin: 12.8 g/dL (ref 12.0–15.0)
MCH: 29.9 pg (ref 26.0–34.0)
MCHC: 31.8 g/dL (ref 30.0–36.0)
MCV: 94.2 fL (ref 80.0–100.0)
Platelets: 204 10*3/uL (ref 150–400)
RBC: 4.28 MIL/uL (ref 3.87–5.11)
RDW: 18.7 % — ABNORMAL HIGH (ref 11.5–15.5)
WBC: 6.7 10*3/uL (ref 4.0–10.5)
nRBC: 0 % (ref 0.0–0.2)

## 2021-02-08 LAB — BASIC METABOLIC PANEL
Anion gap: 8 (ref 5–15)
BUN: 32 mg/dL — ABNORMAL HIGH (ref 8–23)
CO2: 30 mmol/L (ref 22–32)
Calcium: 8.9 mg/dL (ref 8.9–10.3)
Chloride: 101 mmol/L (ref 98–111)
Creatinine, Ser: 1.26 mg/dL — ABNORMAL HIGH (ref 0.44–1.00)
GFR, Estimated: 43 mL/min — ABNORMAL LOW (ref >60)
Glucose, Bld: 96 mg/dL (ref 70–99)
Potassium: 4.2 mmol/L (ref 3.5–5.1)
Sodium: 139 mmol/L (ref 135–145)

## 2021-02-08 MED ORDER — FUROSEMIDE 40 MG PO TABS: 40.0000 mg | ORAL_TABLET | Freq: Every day | ORAL | 2 refills | Status: DC

## 2021-02-08 NOTE — Telephone Encounter (Signed)
Pt left a vm stating that she just got out of the hospital and would like to know if she can go back to ow she was taking her medications before. She said whatever she was doing before was working and she would like to do it again.

## 2021-02-08 NOTE — Telephone Encounter (Signed)
Yes please, there was no changes made in the hospital

## 2021-02-08 NOTE — TOC Progression Note (Signed)
Transition of Care [facility]) - Progression Note    Patient Details  Name: Heidi Adams MRN: [id number] Date of Birth: 1935-12-21  [facility] of [facility] CM/SW Contact  Purcell Mouton, RN Phone Number: 02/08/2021, 1:07 PM  Clinical Narrative:    Spoke with pt and her husband. Both will follow up at home with Home Health. Husband states that the information for [facility] was at home and he will follow up. Two agencies declined pt at present time.         Expected Discharge Plan and Services           Expected Discharge Date: 02/08/21                                     Social Determinants of Health (SDOH) Interventions    Readmission Risk Interventions No flowsheet data found.

## 2021-02-08 NOTE — Plan of Care (Signed)
  Problem: Activity: Goal: Capacity to carry out activities will improve Outcome: Adequate for Discharge   Problem: Cardiac: Goal: Ability to achieve and maintain adequate cardiopulmonary perfusion will improve Outcome: Adequate for Discharge   Problem: Education: Goal: Knowledge of General Education information will improve Description: Including pain rating scale, medication(s)/side effects and non-pharmacologic comfort measures Outcome: Adequate for Discharge   Problem: Education: Goal: Ability to verbalize understanding of medication therapies will improve Outcome: Adequate for Discharge

## 2021-02-08 NOTE — Telephone Encounter (Signed)
Pt aware.

## 2021-02-08 NOTE — Progress Notes (Signed)
Physical Therapy Treatment Patient Details Name: Heidi Adams MRN: [id number] DOB: Nov 22, 1935 Today's Date: 02/08/2021    History of Present Illness Patient admitted for CHF. 85 year old female with a history of systolic CHF with an EF of 20-25% in December 2021 and grade 2 diastolic dysfunction, hypertension, atrial fibrillation, hypothyroidism and breast cancer presenting with dyspnea on exertion for a few days prior to admission.    PT Comments    Progressing with mobility. Pt requires encouragement to do as much as she can for herself.    Follow Up Recommendations  Home health PT;Supervision/Assistance - 24 hour     Equipment Recommendations  None recommended by PT    Recommendations for Other Services       Precautions / Restrictions Precautions Precautions: Fall Restrictions Weight Bearing Restrictions: No    Mobility  Bed Mobility Overal bed mobility: Needs Assistance Bed Mobility: Sit to Supine       Sit to supine: Min guard        Transfers Overall transfer level: Needs assistance Equipment used: Rolling walker (2 wheeled) Transfers: Sit to/from Stand Sit to Stand: Min guard            Ambulation/Gait Ambulation/Gait assistance: Min guard Gait Distance (Feet): 75 Feet Assistive device: Rolling walker (2 wheeled) Gait Pattern/deviations: Step-through pattern     General Gait Details: Fatigues easily. Mildly unsteady but no LOB with RW on today   Stairs             Wheelchair Mobility    Modified Rankin (Stroke Patients Only)       Balance Overall balance assessment: Needs assistance;History of Falls         Standing balance support: Bilateral upper extremity supported Standing balance-Leahy Scale: Poor                              Cognition Arousal/Alertness: Awake/alert Behavior During Therapy: WFL for tasks assessed/performed Overall Cognitive Status: Within Functional Limits for tasks assessed                                         Exercises      General Comments        Pertinent Vitals/Pain Pain Assessment: No/denies pain    Home Living                      Prior Function            PT Goals (current goals can now be found in the care plan section) Progress towards PT goals: Progressing toward goals    Frequency    Min 3X/week      PT Plan Current plan remains appropriate    Co-evaluation              AM-PAC PT "6 Clicks" Mobility   Outcome Measure  Help needed turning from your back to your side while in a flat bed without using bedrails?: A Little Help needed moving from lying on your back to sitting on the side of a flat bed without using bedrails?: A Little Help needed moving to and from a bed to a chair (including a wheelchair)?: A Little Help needed standing up from a chair using your arms (e.g., wheelchair or bedside chair)?: A Little Help needed to walk in hospital room?: A Little Help  needed climbing 3-5 steps with a railing? : A Little 6 Click Score: 18    End of Session Equipment Utilized During Treatment: Gait belt Activity Tolerance: Patient tolerated treatment well Patient left: in bed;with call bell/phone within reach;with bed alarm set;with family/visitor present   PT Visit Diagnosis: Muscle weakness (generalized) (M62.81);History of falling (Z91.81);Difficulty in walking, not elsewhere classified (R26.2)     Time: 1050-1104 PT Time Calculation (min) (ACUTE ONLY): 14 min  Charges:  $Gait Training: 8-22 mins                        Doreatha Massed, PT Acute Rehabilitation  Office: [phone] Pager: [phone]

## 2021-02-08 NOTE — Discharge Summary (Signed)
Physician Discharge Summary  Heidi Adams INO:[id number] DOB: [birth year] DOA: 02/06/2021  PCP: [name], MD  Admit date: 02/06/2021 Discharge date: 02/08/2021  Admitted From: Home Disposition: Home with home health  Recommendations for Outpatient Follow-up:  Follow up with PCP in 1-2 weeks Please obtain BMP/CBC in one week Schedule follow-up with cardiology Dr. [name]  Home Health: PT/OT Equipment/Devices: None  Discharge Condition: Stable CODE STATUS: Full code Diet recommendation: Low-salt diet  Discharge summary:  85 year old female with a history of systolic CHF with an EF of 20-25% in December 2021 and grade 2 diastolic dysfunction, hypertension, atrial fibrillation, hypothyroidism and breast cancer presenting with dyspnea on exertion for a few days prior to admission.  She also had lower extremity edema.  She takes Lasix routinely but that had not been helping with her symptoms so she contacted her cardiology office and they instructed her to seek care in the emergency department.  Chest x-ray showed pulmonary congestion, BNP 1700 and a slight elevation in her creatinine of 1.08 with a baseline around 0.8.  She was started on IV Lasix, COVID testing was negative.  Acute on chronic congestive heart failure.  Known ejection fraction of 25%.  Admitted and treated with IV Lasix with good clinical recovery.  Her symptoms are mostly improved.  There is slight bump in her creatinine.  -3 L balance with good clinical response. Patient to go home today on Lasix 40 mg daily instead of every 3 days. She is already on beta-blockers, losartan and beta-blockers.  Euvolemic today. Will request to repeat renal function test in 1 week. She will follow-up with Dr. [name] as outpatient.  Paroxysmal A. fib: Currently in sinus rhythm.  On amiodarone 200 mg daily, metoprolol succinate 25 mg daily.  She is not taking Eliquis at home.  We discussed about this.  She would like to stay off the Eliquis and  only take after talking to her cardiologist.  Other chronic medical issues including hypothyroidism and hypertension remained stable.  Patient with significant debility and deconditioning, she will benefit with working with PT OT at home.  Home health PT OT ordered.  Discharge Diagnoses:  Principal Problem:   Acute systolic CHF (congestive heart failure) (HCC) Active Problems:   Hypothyroidism   Essential hypertension   Acute CHF (congestive heart failure) [facility])    Discharge Instructions  Discharge Instructions     (HEART FAILURE PATIENTS) Call MD:  Anytime you have any of the following symptoms: 1) 3 pound weight gain in 24 hours or 5 pounds in 1 week 2) shortness of breath, with or without a dry hacking cough 3) swelling in the hands, feet or stomach 4) if you have to sleep on extra pillows at night in order to breathe.   Complete by: As directed    Diet - low sodium heart healthy   Complete by: As directed    Increase activity slowly   Complete by: As directed       Allergies as of 02/08/2021       Reactions   Codeine Nausea Only   Epinephrine    Tachcardyia, chest pains tremors   Tenormin [atenolol] Rash        Medication List     STOP taking these medications    ibuprofen 200 MG tablet Commonly known as: ADVIL       TAKE these medications    acetaminophen 500 MG tablet Commonly known as: TYLENOL Take 1,000 mg by mouth 3 (three) times daily as needed  for moderate pain.   amiodarone 200 MG tablet Commonly known as: PACERONE TAKE 1 TABLET BY MOUTH EVERY DAY   furosemide 40 MG tablet Commonly known as: LASIX Take 1 tablet (40 mg total) by mouth daily. What changed:  when to take this additional instructions   levothyroxine 112 MCG tablet Commonly known as: SYNTHROID Take 112-168 mcg by mouth daily before breakfast. Take 1 tablet (112 mcg) on Mondays, Tuesdays, Wednesdays, Thursdays, Fridays & Saturdays. Take 1.5 tablet (168 mcg) on Sundays    losartan 50 MG tablet Commonly known as: COZAAR TAKE 1 TABLET BY MOUTH EACH NIGHT AT BEDTIME What changed: See the new instructions.   metoprolol succinate 25 MG 24 hr tablet Commonly known as: TOPROL-XL Take 1 tablet (25 mg total) by mouth daily.   pantoprazole 40 MG tablet Commonly known as: PROTONIX Take 40 mg by mouth daily as needed (indigestion/heartburn.).   potassium chloride 10 MEQ tablet Commonly known as: KLOR-CON Take 1 tablet (10 mEq total) by mouth daily as needed. With Furosemide only        Follow-up Information     [name], MD Follow up in 2 week(s).   Specialty: Internal Medicine Contact information: [place] [id number] [phone]                Allergies  Allergen Reactions   Codeine Nausea Only   Epinephrine     Tachcardyia, chest pains tremors   Tenormin [Atenolol] Rash    Consultations: None   Procedures/Studies: DG Chest 2 View  Result Date: 02/06/2021 CLINICAL DATA:  Swelling in her legs for the past week EXAM: CHEST - 2 VIEW COMPARISON:  11/20/2019 FINDINGS: Stable cardiomegaly. Mild central venous congestion. No effusion, infiltrate or pneumothorax. No acute osseous abnormality. IMPRESSION: Cardiomegaly and central venous congestion.  No change from prior. Electronically Signed   By: Suzy Bouchard M.D.   On: 02/06/2021 09:00   VAS [place] LOWER EXTREMITY VENOUS (DVT)  Result Date: 02/07/2021  Lower Venous DVT Study Patient Name:  Heidi Adams  Date of Exam:   02/07/2021 Medical Rec #: [id number]      Accession #:    [id number] Date of Birth: January 21, 1936      Patient Gender: F Patient Age:   84Y Exam Location:  [facility] Procedure:      VAS [place] LOWER EXTREMITY VENOUS (DVT) Referring Phys: [id number] Patience --------------------------------------------------------------------------------  Indications: Edema, CHF.  Comparison Study: No prior studies. Performing Technologist: Darlin Coco RDMS,RVT   Examination Guidelines: A complete evaluation includes B-mode imaging, spectral Doppler, color Doppler, and power Doppler as needed of all accessible portions of each vessel. Bilateral testing is considered an integral part of a complete examination. Limited examinations for reoccurring indications may be performed as noted. The reflux portion of the exam is performed with the patient in reverse Trendelenburg.  +---------+---------------+---------+-----------+----------+--------------+ RIGHT    CompressibilityPhasicitySpontaneityPropertiesThrombus Aging +---------+---------------+---------+-----------+----------+--------------+ CFV      Full           Yes      Yes                                 +---------+---------------+---------+-----------+----------+--------------+ SFJ      Full                                                        +---------+---------------+---------+-----------+----------+--------------+  FV Prox  Full                                                        +---------+---------------+---------+-----------+----------+--------------+ FV Mid   Full                                                        +---------+---------------+---------+-----------+----------+--------------+ FV DistalFull                                                        +---------+---------------+---------+-----------+----------+--------------+ PFV      Full                                                        +---------+---------------+---------+-----------+----------+--------------+ POP      Full           Yes      Yes                                 +---------+---------------+---------+-----------+----------+--------------+ PTV      Full                                                        +---------+---------------+---------+-----------+----------+--------------+ PERO     Full                                                         +---------+---------------+---------+-----------+----------+--------------+   +---------+---------------+---------+-----------+----------+--------------+ LEFT     CompressibilityPhasicitySpontaneityPropertiesThrombus Aging +---------+---------------+---------+-----------+----------+--------------+ CFV      Full           Yes      Yes                                 +---------+---------------+---------+-----------+----------+--------------+ SFJ      Full                                                        +---------+---------------+---------+-----------+----------+--------------+ FV Prox  Full                                                        +---------+---------------+---------+-----------+----------+--------------+  FV Mid   Full                                                        +---------+---------------+---------+-----------+----------+--------------+ FV DistalFull                                                        +---------+---------------+---------+-----------+----------+--------------+ PFV      Full                                                        +---------+---------------+---------+-----------+----------+--------------+ POP      Full           Yes      Yes                                 +---------+---------------+---------+-----------+----------+--------------+ PTV      Full                                                        +---------+---------------+---------+-----------+----------+--------------+ PERO     Full                                                        +---------+---------------+---------+-----------+----------+--------------+     Summary: RIGHT: - There is no evidence of deep vein thrombosis in the lower extremity.  - No cystic structure found in the popliteal fossa.  LEFT: - There is no evidence of deep vein thrombosis in the lower extremity.  - No cystic structure found in the popliteal fossa.   *See table(s) above for measurements and observations. Electronically signed by Monica Martinez MD on 02/07/2021 at 2:56:14 PM.    Final    (Echo, Carotid, EGD, Colonoscopy, ERCP)    Subjective: Patient seen and examined.  She was at the bedside commode and husband was in the room.  Patient tells me that she feels fine and needs to go home.  She thinks her leg edema is better.  Denies any chest pain or shortness of breath.   Discharge Exam: Vitals:   02/07/21 1910 02/08/21 0405  BP: (!) 137/56 (!) 152/93  Pulse: 61 67  Resp: 16 18  Temp: 98 F (36.7 C) (!) 97.5 F (36.4 C)  SpO2: 97% 100%   Vitals:   02/07/21 0916 02/07/21 1910 02/08/21 0405 02/08/21 0600  BP: (!) 139/52 (!) 137/56 (!) 152/93   Pulse: 65 61 67   Resp: 18 16 18    Temp: 97.7 F (36.5 C) 98 F (36.7 C) (!) 97.5 F (36.4 C)   TempSrc: Oral Oral Oral   SpO2: 99%  97% 100%   Weight:    52.6 kg  Height:        General: Pt is alert, awake, not in acute distress On room air. Cardiovascular: RRR, S1/S2 +, no rubs, no gallops Respiratory: CTA bilaterally, no wheezing, no rhonchi Abdominal: Soft, NT, ND, bowel sounds + Extremities: Mostly with advanced venous stasis changes, no palpable fluid or cyanosis.    The results of significant diagnostics from this hospitalization (including imaging, microbiology, ancillary and laboratory) are listed below for reference.     Microbiology: Recent Results (from the past 240 hour(s))  Resp Panel by RT-PCR (Flu A&B, Covid) Nasopharyngeal Swab     Status: None   Collection Time: 02/06/21 10:26 AM   Specimen: Nasopharyngeal Swab; Nasopharyngeal(NP) swabs in vial transport medium  Result Value Ref Range Status   SARS Coronavirus 2 by RT PCR NEGATIVE NEGATIVE Final    Comment: (NOTE) SARS-CoV-2 target nucleic acids are NOT DETECTED.  The SARS-CoV-2 RNA is generally detectable in upper respiratory specimens during the acute phase of infection. The lowest concentration of  SARS-CoV-2 viral copies this assay can detect is 138 copies/mL. A negative result does not preclude SARS-Cov-2 infection and should not be used as the sole basis for treatment or other patient management decisions. A negative result may occur with  improper specimen collection/handling, submission of specimen other than nasopharyngeal swab, presence of viral mutation(s) within the areas targeted by this assay, and inadequate number of viral copies(<138 copies/mL). A negative result must be combined with clinical observations, patient history, and epidemiological information. The expected result is Negative.  Fact Sheet for Patients:  EntrepreneurPulse.com.au  Fact Sheet for Healthcare Providers:  IncredibleEmployment.be  This test is no t yet approved or cleared by the [place] FDA and  has been authorized for detection and/or diagnosis of SARS-CoV-2 by FDA under an Emergency Use Authorization (EUA). This EUA will remain  in effect (meaning this test can be used) for the duration of the COVID-19 declaration under Section 564(b)(1) of the Act, 21 U.S.C.section 360bbb-3(b)(1), unless the authorization is terminated  or revoked sooner.       Influenza A by PCR NEGATIVE NEGATIVE Final   Influenza B by PCR NEGATIVE NEGATIVE Final    Comment: (NOTE) The Xpert Xpress SARS-CoV-2/FLU/RSV plus assay is intended as an aid in the diagnosis of influenza from Nasopharyngeal swab specimens and should not be used as a sole basis for treatment. Nasal washings and aspirates are unacceptable for Xpert Xpress SARS-CoV-2/FLU/RSV testing.  Fact Sheet for Patients: EntrepreneurPulse.com.au  Fact Sheet for Healthcare Providers: IncredibleEmployment.be  This test is not yet approved or cleared by the [place] FDA and has been authorized for detection and/or diagnosis of SARS-CoV-2 by FDA under an Emergency Use  Authorization (EUA). This EUA will remain in effect (meaning this test can be used) for the duration of the COVID-19 declaration under Section 564(b)(1) of the Act, 21 U.S.C. section 360bbb-3(b)(1), unless the authorization is terminated or revoked.  Performed at [facility], [place] [address], [place], [place] [address]      Labs: BNP (last 3 results) Recent Labs    08/17/20 1026 09/09/20 1320 02/06/21 0828  BNP 327.1* 1,136.5* 2,122.4*   Basic Metabolic Panel: Recent Labs  Lab 02/06/21 0828 02/07/21 0357 02/08/21 0340  NA 141 139 139  K 3.2* 3.1* 4.2  CL 103 99 101  CO2 27 31 30   GLUCOSE 86 87 96  BUN 30* 29* 32*  CREATININE 1.08* 1.14* 1.26*  CALCIUM 8.8* 8.4* 8.9  MG  --  1.5*  --    Liver Function Tests: Recent Labs  Lab 02/06/21 0828  AST 15  ALT 18  ALKPHOS 45  BILITOT 0.9  PROT 6.1*  ALBUMIN 3.5   No results for input(s): LIPASE, AMYLASE in the last 168 hours. No results for input(s): AMMONIA in the last 168 hours. CBC: Recent Labs  Lab 02/06/21 0828 02/07/21 0357 02/08/21 0340  WBC 7.8 7.6 6.7  NEUTROABS 6.8  --   --   HGB 13.2 12.3 12.8  HCT 41.4 38.4 40.3  MCV 94.7 93.9 94.2  PLT 160 174 204   Cardiac Enzymes: No results for input(s): CKTOTAL, CKMB, CKMBINDEX, TROPONINI in the last 168 hours. BNP: Invalid input(s): POCBNP CBG: No results for input(s): GLUCAP in the last 168 hours. D-Dimer No results for input(s): DDIMER in the last 72 hours. Hgb A1c No results for input(s): HGBA1C in the last 72 hours. Lipid Profile No results for input(s): CHOL, HDL, LDLCALC, TRIG, CHOLHDL, LDLDIRECT in the last 72 hours. Thyroid function studies No results for input(s): TSH, T4TOTAL, T3FREE, THYROIDAB in the last 72 hours.  Invalid input(s): FREET3 Anemia work up No results for input(s): VITAMINB12, FOLATE, FERRITIN, TIBC, IRON, RETICCTPCT in the last 72 hours. Urinalysis    Component Value Date/Time   COLORURINE YELLOW  03/21/2020 2243   APPEARANCEUR CLEAR 03/21/2020 2243   LABSPEC 1.010 03/21/2020 2243   PHURINE 5.5 03/21/2020 2243   GLUCOSEU NEGATIVE 03/21/2020 2243   HGBUR SMALL (A) 03/21/2020 2243   BILIRUBINUR NEGATIVE 03/21/2020 2243   KETONESUR NEGATIVE 03/21/2020 2243   PROTEINUR 100 (A) 03/21/2020 2243   NITRITE NEGATIVE 03/21/2020 2243   LEUKOCYTESUR MODERATE (A) 03/21/2020 2243   Sepsis Labs Invalid input(s): PROCALCITONIN,  WBC,  LACTICIDVEN Microbiology Recent Results (from the past 240 hour(s))  Resp Panel by RT-PCR (Flu A&B, Covid) Nasopharyngeal Swab     Status: None   Collection Time: 02/06/21 10:26 AM   Specimen: Nasopharyngeal Swab; Nasopharyngeal(NP) swabs in vial transport medium  Result Value Ref Range Status   SARS Coronavirus 2 by RT PCR NEGATIVE NEGATIVE Final    Comment: (NOTE) SARS-CoV-2 target nucleic acids are NOT DETECTED.  The SARS-CoV-2 RNA is generally detectable in upper respiratory specimens during the acute phase of infection. The lowest concentration of SARS-CoV-2 viral copies this assay can detect is 138 copies/mL. A negative result does not preclude SARS-Cov-2 infection and should not be used as the sole basis for treatment or other patient management decisions. A negative result may occur with  improper specimen collection/handling, submission of specimen other than nasopharyngeal swab, presence of viral mutation(s) within the areas targeted by this assay, and inadequate number of viral copies(<138 copies/mL). A negative result must be combined with clinical observations, patient history, and epidemiological information. The expected result is Negative.  Fact Sheet for Patients:  EntrepreneurPulse.com.au  Fact Sheet for Healthcare Providers:  IncredibleEmployment.be  This test is no t yet approved or cleared by the [place] FDA and  has been authorized for detection and/or diagnosis of SARS-CoV-2 by FDA under  an Emergency Use Authorization (EUA). This EUA will remain  in effect (meaning this test can be used) for the duration of the COVID-19 declaration under Section 564(b)(1) of the Act, 21 U.S.C.section 360bbb-3(b)(1), unless the authorization is terminated  or revoked sooner.       Influenza A by PCR NEGATIVE NEGATIVE Final   Influenza B by PCR NEGATIVE NEGATIVE Final  Comment: (NOTE) The Xpert Xpress SARS-CoV-2/FLU/RSV plus assay is intended as an aid in the diagnosis of influenza from Nasopharyngeal swab specimens and should not be used as a sole basis for treatment. Nasal washings and aspirates are unacceptable for Xpert Xpress SARS-CoV-2/FLU/RSV testing.  Fact Sheet for Patients: EntrepreneurPulse.com.au  Fact Sheet for Healthcare Providers: IncredibleEmployment.be  This test is not yet approved or cleared by the [place] FDA and has been authorized for detection and/or diagnosis of SARS-CoV-2 by FDA under an Emergency Use Authorization (EUA). This EUA will remain in effect (meaning this test can be used) for the duration of the COVID-19 declaration under Section 564(b)(1) of the Act, 21 U.S.C. section 360bbb-3(b)(1), unless the authorization is terminated or revoked.  Performed at [facility], [place] [address] [place], [place] [address]      Time coordinating discharge:  28 minutes  SIGNED:   Barb Merino, MD  [facility] 02/08/2021, 10:38 AM

## 2021-02-09 ENCOUNTER — Other Ambulatory Visit: Payer: Self-pay | Admitting: Cardiology

## 2021-02-09 ENCOUNTER — Telehealth: Payer: Self-pay

## 2021-02-09 DIAGNOSIS — I480 Paroxysmal atrial fibrillation: Secondary | ICD-10-CM

## 2021-02-09 MED ORDER — APIXABAN 5 MG PO TABS: 5.0000 mg | ORAL_TABLET | Freq: Two times a day (BID) | ORAL | 5 refills | Status: DC

## 2021-02-09 NOTE — Telephone Encounter (Signed)
When I had seen the patient in May 2022, she had mentioned that she was on Eliquis and she was going to confirm this by calling us back, she has been taking the Eliquis and hence I referred this at 5 mg p.o. twice daily.    ICD-10-CM   1. Paroxysmal atrial fibrillation (HCC)  I48.0 apixaban (ELIQUIS) 5 MG TABS tablet     Meds ordered this encounter  Medications   apixaban (ELIQUIS) 5 MG TABS tablet    Sig: Take 1 tablet (5 mg total) by mouth 2 (two) times daily.    Dispense:  60 tablet    Refill:  Hopkins, MD, [facility] 02/09/2021, 9:45 AM Office: [phone] Fax: [phone] Pager: [phone]

## 2021-02-09 NOTE — Telephone Encounter (Signed)
Patient called stating that the hospital denied to refill her Eliquis. She is asking if we can restart it. If, so, do you want her to do the 2.5mg  or 5mg  because they are both on her discontinued list. Please advise.

## 2021-02-09 NOTE — TOC Progression Note (Signed)
Transition of Care [facility]) - Progression Note    Patient Details  Name: Heidi Adams MRN: [id number] Date of Birth: 1935/10/18  Transition of Care [facility]) CM/SW Contact  Purcell Mouton, RN Phone Number: 02/09/2021, 3:42 PM  Clinical Narrative:     Butch Penny with [facility] called to ask for [facility] referral to be sent to [facility]. Pt has discharged and this CM tried to assist pt on yesterday before discharge. Pt and husband stated they would call PCP for [facility], that they already had HH at home. Referral was sent to [facility] in house rep.        Expected Discharge Plan and Services           Expected Discharge Date: 02/08/21                                     Social Determinants of Health (SDOH) Interventions    Readmission Risk Interventions No flowsheet data found.

## 2021-02-09 NOTE — Telephone Encounter (Signed)
Patient aware.

## 2021-02-09 NOTE — Telephone Encounter (Signed)
Okay to refill? 

## 2021-02-16 DIAGNOSIS — R82998 Other abnormal findings in urine: Secondary | ICD-10-CM | POA: Diagnosis not present

## 2021-02-16 DIAGNOSIS — E785 Hyperlipidemia, unspecified: Secondary | ICD-10-CM | POA: Diagnosis not present

## 2021-02-24 ENCOUNTER — Encounter: Payer: Self-pay | Admitting: Cardiology

## 2021-02-24 ENCOUNTER — Ambulatory Visit: Payer: PPO | Admitting: Cardiology

## 2021-02-24 ENCOUNTER — Other Ambulatory Visit: Payer: Self-pay

## 2021-02-24 VITALS — BP 173/82 | HR 71 | Temp 97.2°F | Resp 16 | Ht 64.0 in | Wt 123.2 lb

## 2021-02-24 DIAGNOSIS — I10 Essential (primary) hypertension: Secondary | ICD-10-CM | POA: Diagnosis not present

## 2021-02-24 DIAGNOSIS — I951 Orthostatic hypotension: Secondary | ICD-10-CM

## 2021-02-24 DIAGNOSIS — I5022 Chronic systolic (congestive) heart failure: Secondary | ICD-10-CM

## 2021-02-24 DIAGNOSIS — I428 Other cardiomyopathies: Secondary | ICD-10-CM | POA: Insufficient documentation

## 2021-02-24 DIAGNOSIS — I480 Paroxysmal atrial fibrillation: Secondary | ICD-10-CM | POA: Diagnosis not present

## 2021-02-24 MED ORDER — POTASSIUM CHLORIDE CRYS ER 10 MEQ PO TBCR: 20.0000 meq | EXTENDED_RELEASE_TABLET | ORAL | 1 refills | Status: DC

## 2021-02-24 MED ORDER — FUROSEMIDE 40 MG PO TABS: 40.0000 mg | ORAL_TABLET | ORAL | 1 refills | Status: DC

## 2021-02-24 NOTE — Progress Notes (Signed)
Primary Physician/Referring:  [name], MD  Patient ID: Heidi Adams, female    DOB: July 02, 1936, 85 y.o.   MRN: [id number]  Chief Complaint  Patient presents with   Chronic systolic heart failure    Hospitalization Follow-up   HPI:    Heidi Adams  is a 85 y.o. female  with bilateral hip replacement and knee replacement and left breast lumpectomy in 2018 without chemotherapy or radiation therapy for breast cancer and also remote hysterectomy due to uterine cancer in 1991, supine hypertension and orthostatic hypotension, paroxysmal atrial fibrillation on chronic Eliquis, chronic dizziness on standing. Has not been able to tolerate any BP medications due to severe dizzy spells.  She also has history of A. fib with RVR SP direct-current cardioversion due to acute decompensated heart failure with severely systolic dysfunction on 123456 and also GI bleed at the time.  The patient presents for follow up of her recent hospitalization on 02/06/2021 for acute on chronic heart failure. She was instructed to take 80 mg Lasix in the morning and 80 mg Lasix at night. This did not help her swelling or shortness of breath, so she was admitted to the hospital.  She was treated with 40 mg IV Lasix twice a day in the hospital. 3L of fluid was diuresed in the hospital. Discharge summary from the hospital advised the patient to take Lasix 40 mg daily.   The patient reports feeling well today. She was started back on Eliquis in the hospital and she has reports of daily nightmares, arm pain, and right hip pain since starting Eliquis again. The patient is not happy about taking Eliquis. The patient denies stress as a potential external cause of nightmares. The patient has not had increased fatigue during the day due to the nightmares or pain.  She has been taking Lasix on an as needed basis even though the discharge summary advised the patient to take 40 mg Lasix daily. When she or her husband notice swelling in  her ankles, she takes a 40 mg Lasix. She does weigh herself at home and notes that it has been stable after her hospitalization.   Denies chest pain, SOB, palpitations, increased swelling, headaches, dizziness, fatigue, orthopnea, melena, hematochezia, or hematuria.   Past Medical History:  Diagnosis Date   Allergy    Anemia    hx of anemia   Arthritis    Breast cancer ([place])    Left breast   Cancer ([place])    skin ca nose   Chronic combined systolic and diastolic CHF (congestive heart failure) ([place]) 09/22/2018   Chronic combined systolic and diastolic CHF (congestive heart failure) ([place]) 09/22/2018   Chronic combined systolic and diastolic CHF (congestive heart failure) ([place]) 09/22/2018   Dysrhythmia    went into Atrial Fibrillation after last surgery, HAS PALPITATIONS   History of skin cancer    Hypertension    Hypothyroidism    PVC (premature ventricular contraction)    Thyroiditis 1973   Past Surgical History:  Procedure Laterality Date   ABDOMINAL HYSTERECTOMY  90   TAH BSO   APPENDECTOMY     BREAST LUMPECTOMY Left 05/30/2017   BREAST LUMPECTOMY WITH RADIOACTIVE SEED LOCALIZATION Left 05/30/2017   Procedure: LEFT BREAST LUMPECTOMY WITH RADIOACTIVE SEED LOCALIZATION;  Surgeon: Excell Seltzer, MD;  Location: [facility];  Service: General;  Laterality: Left;   CATARACT EXTRACTION  09/10/2018   Right eye   CHOLECYSTECTOMY     dr Georgia Lopes   ELBOW SURGERY  EYE SURGERY     "on my eyelids" years ago   KNEE ARTHROSCOPY     left   THYROIDECTOMY  1973   TONSILLECTOMY     TOTAL HIP ARTHROPLASTY Right 05/10/2016   Procedure: TOTAL HIP ARTHROPLASTY ANTERIOR APPROACH;  Surgeon: Paralee Cancel, MD;  Location: [facility];  Service: Orthopedics;  Laterality: Right;   TOTAL HIP ARTHROPLASTY Left 01/10/2017   Procedure: LEFT TOTAL HIP ARTHROPLASTY ANTERIOR APPROACH;  Surgeon: Paralee Cancel, MD;  Location: [facility];  Service: Orthopedics;  Laterality: Left;   TOTAL KNEE  ARTHROPLASTY Left 09/28/2015   Procedure: LEFT TOTAL KNEE ARTHROPLASTY;  Surgeon: Paralee Cancel, MD;  Location: [facility];  Service: Orthopedics;  Laterality: Left;   TOTAL KNEE ARTHROPLASTY Right 08/09/2016   Procedure: RIGHT TOTAL KNEE ARTHROPLASTY;  Surgeon: Paralee Cancel, MD;  Location: [facility];  Service: Orthopedics;  Laterality: Right;  Adductor Block   Total Left hip arthroplasty     01/10/17 Dr. Alvan Dame   Family History  Problem Relation Age of Onset   Diabetes Father    Hypertension Father    Stroke Father    Breast cancer Cousin    Pneumonia Sister     Social History   Tobacco Use   Smoking status: Former    Packs/day: 1.00    Years: 20.00    Pack years: 20.00    Types: Cigarettes    Quit date: 07/26/1987    Years since quitting: 33.6   Smokeless tobacco: Never  Substance Use Topics   Alcohol use: No    Alcohol/week: 0.0 standard drinks  Marital Status: Married   ROS  Review of Systems  Constitutional: Negative for malaise/fatigue and weight gain.  Cardiovascular:  Positive for dyspnea on exertion (mild) and leg swelling (intermittent, stable and mild). Negative for chest pain, orthopnea, palpitations and syncope.  Respiratory:  Negative for shortness of breath.   Musculoskeletal:  Positive for arthritis and joint pain. Negative for joint swelling.  Gastrointestinal:  Negative for hematochezia and melena.  Genitourinary:  Negative for hematuria.  Neurological:  Negative for light-headedness.  Objective  Blood pressure (!) 173/82, pulse 71, temperature (!) 97.2 F (36.2 C), temperature source Temporal, resp. rate 16, height '5\' 4"'$  (1.626 m), weight 55.9 kg, last menstrual period 07/25/1988, SpO2 96 %.  Vitals with BMI 02/24/2021 02/08/2021 02/07/2021  Height '5\' 4"'$  - -  Weight 123 lbs 3 oz 115 lbs 15 oz -  BMI [id number] -  Systolic [id number] 0000000  Diastolic 82 93 56  Pulse 71 67 61    Orthostatic VS for the past 72 hrs (Last 3 readings):  Patient Position BP Location Cuff Size   02/24/21 1031 Sitting Left Arm Normal      Physical Exam Constitutional:      General: She is not in acute distress.    Appearance: She is ill-appearing.     Comments: Moderately build, frail  Cardiovascular:     Rate and Rhythm: Normal rate and regular rhythm.     Pulses: Intact distal pulses.          Radial pulses are 2+ on the right side and 2+ on the left side.       Dorsalis pedis pulses are 1+ on the right side and 1+ on the left side.       Posterior tibial pulses are 1+ on the right side and 1+ on the left side.     Heart sounds: S1 normal and S2 normal. Murmur heard.  High-pitched blowing holosystolic murmur is present with a grade of 2/6 at the apex.  High-pitched blowing decrescendo early diastolic murmur is present with a grade of 3/4 at the upper right sternal border radiating to the apex.    No gallop.  Pulmonary:     Effort: No accessory muscle usage or respiratory distress.     Breath sounds: No wheezing, rhonchi (bibasilar coarse crackles, right worse, at the bases) or rales.  Musculoskeletal:        General: Swelling (trace edema) present.   Laboratory examination:   Recent Labs    03/27/20 1458 06/23/20 1100 08/17/20 1026 09/09/20 1319 10/28/20 1034 02/06/21 0828 02/07/21 0357 02/08/21 0340  NA 139   < > 139 143   < > 141 139 139  K 3.7   < > 3.4* 4.2   < > 3.2* 3.1* 4.2  CL 104   < > 96 104   < > 103 99 101  CO2 26   < > 28 23   < > '27 31 30  '$ GLUCOSE 100*   < > 89 81   < > 86 87 96  BUN 17   < > 28* 22   < > 30* 29* 32*  CREATININE 0.99   < > 1.56* 0.78   < > 1.08* 1.14* 1.26*  CALCIUM 7.9*   < > 9.1 9.2   < > 8.8* 8.4* 8.9  GFRNONAA 53*   < > 31* 71   < > 51* 48* 43*  GFRAA >60  --  35* 82  --   --   --   --    < > = values in this interval not displayed.    estimated creatinine clearance is 29.7 mL/min (A) (by C-G formula based on SCr of 1.26 mg/dL (H)).  CMP Latest Ref Rng & Units 02/08/2021 02/07/2021 02/06/2021  Glucose 70 - 99 mg/dL 96 87  86  BUN 8 - 23 mg/dL 32(H) 29(H) 30(H)  Creatinine 0.44 - 1.00 mg/dL 1.26(H) 1.14(H) 1.08(H)  Sodium 135 - 145 mmol/L 139 139 141  Potassium 3.5 - 5.1 mmol/L 4.2 3.1(L) 3.2(L)  Chloride 98 - 111 mmol/L 101 99 103  CO2 22 - 32 mmol/L '30 31 27  '$ Calcium 8.9 - 10.3 mg/dL 8.9 8.4(L) 8.8(L)  Total Protein 6.5 - 8.1 g/dL - - 6.1(L)  Total Bilirubin 0.3 - 1.2 mg/dL - - 0.9  Alkaline Phos 38 - 126 U/L - - 45  AST 15 - 41 U/L - - 15  ALT 0 - 44 U/L - - 18   CBC Latest Ref Rng & Units 02/08/2021 02/07/2021 02/06/2021  WBC 4.0 - 10.5 K/uL 6.7 7.6 7.8  Hemoglobin 12.0 - 15.0 g/dL 12.8 12.3 13.2  Hematocrit 36.0 - 46.0 % 40.3 38.4 41.4  Platelets 150 - 400 K/uL 204 174 160   Lipid Panel     Component Value Date/Time   CHOL 142 07/31/2018 0655   TRIG 72 07/31/2018 0655   HDL 45 07/31/2018 0655   CHOLHDL 3.2 07/31/2018 0655   VLDL 14 07/31/2018 0655   LDLCALC 83 07/31/2018 0655   HEMOGLOBIN A1C Lab Results  Component Value Date   HGBA1C 5.0 07/31/2018   MPG 96.8 07/31/2018   TSH Recent Labs    03/21/20 2329 01/26/21 1028  TSH 5.095* 17.100*    BNP (last 3 results) Recent Labs    08/17/20 1026 09/09/20 1320 02/06/21 0828  BNP 327.1* 1,136.5* 1,741.5*  External labs:   Lipid Panel completed 04/09/2019 HDL 38 MG/DL 04/09/2019 LDL 80.000 mg 04/09/2019 Cholesterol, total 143.000 m 04/09/2019 Triglycerides 127.000 04/09/2019  A1C 5.000 07/31/2018  Glucose Random 110.000 m 11/04/2019 MicroAlbumin Urine 9.000 04/25/2019 MicroAlbumin/Creat 16.8 MG/ 04/25/2019 BUN 18.000 mg 11/04/2019 Creatinine, Serum 0.700 mg/ 11/04/2019  TSH 2.620 micr 11/04/2019   Medications and allergies   Allergies  Allergen Reactions   Codeine Nausea Only   Epinephrine     Tachcardyia, chest pains tremors   Tenormin [Atenolol] Rash    Outpatient Medications Prior to Visit  Medication Sig Dispense Refill   acetaminophen (TYLENOL) 500 MG tablet Take 1,000 mg by mouth 3 (three) times daily as needed  for moderate pain.      amiodarone (PACERONE) 200 MG tablet TAKE 1 TABLET BY MOUTH EVERY DAY 90 tablet 1   apixaban (ELIQUIS) 5 MG TABS tablet Take 1 tablet (5 mg total) by mouth 2 (two) times daily. 60 tablet 5   furosemide (LASIX) 40 MG tablet Take 1 tablet (40 mg total) by mouth daily. 30 tablet 2   levothyroxine (SYNTHROID, LEVOTHROID) 112 MCG tablet Take 112-168 mcg by mouth daily before breakfast. Take 1 tablet (112 mcg) on Mondays, Tuesdays, Wednesdays, Thursdays, Fridays & Saturdays. Take 1.5 tablet (168 mcg) on Sundays     losartan (COZAAR) 50 MG tablet TAKE 1 TABLET BY MOUTH EACH NIGHT AT BEDTIME 90 tablet 1   metoprolol succinate (TOPROL-XL) 25 MG 24 hr tablet Take 1 tablet (25 mg total) by mouth daily. 90 tablet 1   pantoprazole (PROTONIX) 40 MG tablet Take 40 mg by mouth daily as needed (indigestion/heartburn.).      potassium chloride (KLOR-CON) 10 MEQ tablet Take 1 tablet (10 mEq total) by mouth daily as needed. With Furosemide only 60 tablet 1   No facility-administered medications prior to visit.    Radiology:   No results found.  CXR 02/06/2021: Stable cardiomegaly. Mild central venous congestion. No effusion, infiltrate or pneumothorax. No acute osseous abnormality.  Cardiac Studies:    Lexiscan Myoview stress test  04/23/2018: 1. The resting electrocardiogram demonstrated normal sinus rhythm, normal resting conduction and no resting arrhythmias.  Stress EKG is non-diagnostic for ischemia as it a pharmacologic stress using Lexiscan. Occasional PAC and PVC. The patient developed significant symptoms which included Chest Pain, Stomach pain, Headache.  2. The overall quality of the study is good. There is no evidence of abnormal lung activity. Stress and rest SPECT images demonstrate homogeneous tracer distribution throughout the myocardium. Gated SPECT imaging reveals diffuse global hypokinesis. The left ventricular ejection fraction was markedly depressed at (26%).   3. This  is a high risk study due to severe LV systolic dysfunction. Findings may represent non ischemic cardiomyopathy.  Echocardiogram 07/23/2020: Left ventricle cavity is normal in size and wall thickness. Severe global hypokinesis with visual LVEF 20-25%.  Doppler evidence of grade II (pseudonormal) diastolic dysfunction, elevated LAP.  The aortic root is mildly dilated at 3.9 cm. Left atrial cavity is moderately dilated. Trileaflet aortic valve.  Moderate (Grade III) aortic regurgitation. Structurally normal mitral valve.  Moderate (Grade III) mitral regurgitation. Mild tricuspid regurgitation.  No evidence of pulmonary hypertension. Compared to previous study on 03/10/2021, LVEF is marginally improved from 15-20%. Mild pulmonary hypertension is now absent.   EKG  EKG 07/14/2020: Normal sinus rhythm with rate of 77 bpm, left axis deviation, left intrafascicular block.  Poor R wave progression, cannot exclude anteroseptal infarct old.  Normal QT interval.  Single PAC.  No change from 01/10/2020.  EKG 03/25/2020: Atrial fibrillation with rapid ventricular response at the rate of 140 bpm, left axis deviation, left intrafascicular block.  Poor R wave progression, cannot exclude anteroseptal infarct old.  Nonspecific ST-T abnormality, cannot exclude lateral ischemia.   Assessment   No diagnosis found.   CHA2DS2-VASc Score is 5.  Yearly risk of stroke: 7.2% (A, F, HTN, CHF).  Score of 1=0.6; 2=2.2; 3=3.2; 4=4.8; 5=7.2; 6=9.8; 7=>9.8) -(CHF; HTN; vasc disease DM,  Female = 1; Age <65 =0; 65-74 = 1,  >75 =2; stroke/embolism= 2).   No orders of the defined types were placed in this encounter.   There are no discontinued medications.   No orders of the defined types were placed in this encounter.   Recommendations:   AZILEE TUBBY  is a 85 y.o. female  with bilateral hip replacement and knee replacement and left breast lumpectomy in 2018 without chemotherapy or radiation therapy for breast cancer  and also remote hysterectomy due to uterine cancer in 1991, supine hypertension and orthostatic hypotension, paroxysmal atrial fibrillation on chronic Eliquis, chronic dizziness on standing. Has not been able to tolerate any BP medications due to severe dizzy spells.  She also has history of A. fib with RVR SP direct-current cardioversion due to acute decompensated heart failure with severely systolic dysfunction on [id number] and also GI bleed at the time.    The patient has not been taking her Lasix as prescribed. The importance of Lasix use in the prevention of acute heart failure exacerbations was discussed with the patient. The patient verbalized understanding. The patient was advised to take 40 mg of Lasix every other day. She should take it daily if her weight increases by 3 lbs or if she develops SOB. The patient should continue monitoring her daily weight.   The patient has a history of hypokalemia while on Lasix. Prescription for potassium chloride 10 mEq sent. She was advised to take 2 tablets (20 mEq) every other day to be taken with her Lasix. The patient will need repeat BMP with Lasix therapy  Discussed risk, benefits, and indication for anticoagulation with Eliquis with the patient. Patient verbalizes understanding and agreement. Patient would still like to discontinue Eliquis today.   Blood pressure is again elevated however she is extremely skeptical of starting any new medication. She also has orthostatic hypotension making treatment of her heart failure and hypertension extremely difficult place she has multiple medication intolerances as well.    Exam did not show orthostatic hypotension today. Will continue to monitor.   The patient is a good candidate for the LUX-Dx TRENDS study. The patient verbalized interest in being a part of the study.   Follow up in 4 weeks for CHF, A fib, and orthostatic hypotension.    [place], PA-S 02/24/2021, 10:42 AM Office: [phone]

## 2021-02-25 DIAGNOSIS — I5032 Chronic diastolic (congestive) heart failure: Secondary | ICD-10-CM | POA: Diagnosis not present

## 2021-02-25 DIAGNOSIS — I10 Essential (primary) hypertension: Secondary | ICD-10-CM | POA: Diagnosis not present

## 2021-02-25 DIAGNOSIS — I480 Paroxysmal atrial fibrillation: Secondary | ICD-10-CM | POA: Diagnosis not present

## 2021-02-26 DIAGNOSIS — I110 Hypertensive heart disease with heart failure: Secondary | ICD-10-CM | POA: Diagnosis not present

## 2021-03-02 ENCOUNTER — Other Ambulatory Visit: Payer: PPO

## 2021-03-04 ENCOUNTER — Encounter: Payer: PPO | Admitting: Cardiology

## 2021-03-04 ENCOUNTER — Ambulatory Visit: Payer: PPO

## 2021-03-04 ENCOUNTER — Other Ambulatory Visit: Payer: Self-pay

## 2021-03-04 DIAGNOSIS — I5022 Chronic systolic (congestive) heart failure: Secondary | ICD-10-CM | POA: Diagnosis not present

## 2021-03-04 DIAGNOSIS — I480 Paroxysmal atrial fibrillation: Secondary | ICD-10-CM | POA: Diagnosis not present

## 2021-03-04 DIAGNOSIS — I428 Other cardiomyopathies: Secondary | ICD-10-CM | POA: Diagnosis not present

## 2021-03-05 ENCOUNTER — Telehealth: Payer: Self-pay | Admitting: Podiatry

## 2021-03-05 MED ORDER — CEPHALEXIN 500 MG PO CAPS: 500.0000 mg | ORAL_CAPSULE | Freq: Two times a day (BID) | ORAL | 0 refills | Status: DC

## 2021-03-05 NOTE — Addendum Note (Signed)
Addended by: Hardie Pulley on: 03/05/2021 05:08 PM   Modules accepted: Orders

## 2021-03-05 NOTE — Telephone Encounter (Signed)
Pt had partial nail removal 01-18-21.  Pt states nail is no better & is painful, swollen and has blood/poss pus & wanted to be seen.  Did you want to call in antibiotic [facility] pls advise

## 2021-03-08 ENCOUNTER — Other Ambulatory Visit: Payer: Self-pay

## 2021-03-08 ENCOUNTER — Ambulatory Visit: Payer: PPO | Admitting: Podiatry

## 2021-03-08 DIAGNOSIS — M79676 Pain in unspecified toe(s): Secondary | ICD-10-CM | POA: Diagnosis not present

## 2021-03-08 DIAGNOSIS — L600 Ingrowing nail: Secondary | ICD-10-CM

## 2021-03-08 NOTE — Progress Notes (Signed)
  Subjective:  Patient ID: Heidi Adams, female    DOB: 1935-08-07,  MRN: [id number]  Chief Complaint  Patient presents with   Nail Problem    Increase of redness, swelling and still bleeding at Lt hallux medial side - very tender to touch; 10/10 - Tx: water soakds, silvadene w/ bandiad - scabb falls off and comes right back after soaking    85 y.o. female presents with the above complaint. History confirmed with patient.  Objective:  Physical Exam: warm, good capillary refill, no trophic changes or ulcerative lesions, normal DP and PT pulses, and normal sensory exam.  Painful ingrowing nail at  medial border of the left, hallux; local warmth noted and sanguinous drainage noted  Assessment:   1. Ingrown nail   2. Pain around toenail    Plan:  Patient was evaluated and treated and all questions answered.  Ingrown Nail, left -Nail again debrided in slant back fashion to patient relief. Continue abx to completion. Remove dressing and soak tonight. F/u should issues persist.  No follow-ups on file.

## 2021-03-10 ENCOUNTER — Telehealth: Payer: Self-pay | Admitting: Pharmacist

## 2021-03-10 NOTE — Telephone Encounter (Addendum)
Pt called with concerns of continued weight loss since taking lasix QOD. Pt reports that she has been losing 1-2 lbs daily. Most Recent weight reading of 114.4. Pt discharged from the hospital on 02/09/21 with a weight reading 112. Pt reports having one episode of diarrhea last week. Reports mild lightheadedness and dizziness x1. Reports having one episode of fall 2 weeks ago due to losing her balance. No recurrence since. Reports that she has been eating three regular meals throughout the day and continues to stay hydrated. Denies any complains of dry mouth, persistent diarrhea, lack of urine output, extreme thirst. Pt asking if she should continue with Lasix 40 mg QOD to TIW or PRN.

## 2021-03-11 ENCOUNTER — Other Ambulatory Visit: Payer: PPO

## 2021-03-11 NOTE — Telephone Encounter (Signed)
Discussed with Dr. [name]. Per Dr. [name] okay to return to previous Lasix dose of 40 mg every 3 days. Pt aware the need to keep a close eye on her weight and the need for extra lasix if Wt goes up more that 2 lbs in a day. Also reviewed with pt the importance of limiting salt intake. Will continue remote monitoring home weight readings.

## 2021-03-18 ENCOUNTER — Encounter: Payer: Self-pay | Admitting: Cardiology

## 2021-03-18 ENCOUNTER — Other Ambulatory Visit: Payer: Self-pay

## 2021-03-18 ENCOUNTER — Ambulatory Visit: Payer: PPO | Admitting: Cardiology

## 2021-03-18 VITALS — BP 173/94 | HR 70 | Temp 97.2°F | Resp 16 | Ht 64.0 in | Wt 117.0 lb

## 2021-03-18 DIAGNOSIS — I428 Other cardiomyopathies: Secondary | ICD-10-CM | POA: Diagnosis not present

## 2021-03-18 DIAGNOSIS — I480 Paroxysmal atrial fibrillation: Secondary | ICD-10-CM

## 2021-03-18 DIAGNOSIS — Z66 Do not resuscitate: Secondary | ICD-10-CM | POA: Diagnosis not present

## 2021-03-18 DIAGNOSIS — I951 Orthostatic hypotension: Secondary | ICD-10-CM | POA: Diagnosis not present

## 2021-03-18 DIAGNOSIS — F17210 Nicotine dependence, cigarettes, uncomplicated: Secondary | ICD-10-CM | POA: Diagnosis not present

## 2021-03-18 DIAGNOSIS — I5022 Chronic systolic (congestive) heart failure: Secondary | ICD-10-CM

## 2021-03-18 NOTE — Progress Notes (Signed)
Primary Physician/Referring:  [name], MD  Patient ID: Heidi Adams, female    DOB: 08/04/1935, 85 y.o.   MRN: :[id number]  Chief Complaint  Patient presents with   Congestive Heart Failure   Follow-up    3 months   HPI:    Heidi Adams  is a 85 y.o. female  with bilateral hip replacement and knee replacement and left breast lumpectomy in 2018 without chemotherapy or radiation therapy for breast cancer and also remote hysterectomy due to uterine cancer in 1991, supine hypertension and orthostatic hypotension, paroxysmal atrial fibrillation on chronic Eliquis, chronic dizziness on standing orthostatic hypotension. Has not been able to tolerate any BP medications due to severe dizzy spells.  She also has history of A. fib with RVR SP direct-current cardioversion due to acute decompensated heart failure with severely systolic dysfunction on [id number] and also GI bleed at the time.  She is now tolerating Eliquis.    Patient is presently doing well and has not had any recent hospitalizations, worsening dyspnea or worsening leg edema.   This is a 3 month OV and she is accompanied by her husband, Heidi Adams.  Dyspnea is remained stable, no PND or orthopnea, no leg edema.  She is now taking Lasix every third day.  No chest pain or palpitations.  She is wondering if the medication she is taking is making her feel tired during the daytime and would like to discontinue medications, same topic she had discussed previously.  Past Medical History:  Diagnosis Date   Allergy    Anemia    hx of anemia   Arthritis    Breast cancer ([place])    Left breast   Cancer ([place])    skin ca nose   Chronic combined systolic and diastolic CHF (congestive heart failure) ([place]) 09/22/2018   Chronic combined systolic and diastolic CHF (congestive heart failure) ([place]) 09/22/2018   Chronic combined systolic and diastolic CHF (congestive heart failure) ([place]) 09/22/2018   Dysrhythmia    went into Atrial Fibrillation after last  surgery, HAS PALPITATIONS   History of skin cancer    Hypertension    Hypothyroidism    PVC (premature ventricular contraction)    Thyroiditis 1973   Past Surgical History:  Procedure Laterality Date   ABDOMINAL HYSTERECTOMY  90   TAH BSO   APPENDECTOMY     BREAST LUMPECTOMY Left 05/30/2017   BREAST LUMPECTOMY WITH RADIOACTIVE SEED LOCALIZATION Left 05/30/2017   Procedure: LEFT BREAST LUMPECTOMY WITH RADIOACTIVE SEED LOCALIZATION;  Surgeon: Excell Seltzer, MD;  Location: [facility];  Service: General;  Laterality: Left;   CATARACT EXTRACTION  09/10/2018   Right eye   CHOLECYSTECTOMY     dr Georgia Lopes   ELBOW SURGERY     EYE SURGERY     "on my eyelids" years ago   KNEE ARTHROSCOPY     left   THYROIDECTOMY  1973   TONSILLECTOMY     TOTAL HIP ARTHROPLASTY Right 05/10/2016   Procedure: TOTAL HIP ARTHROPLASTY ANTERIOR APPROACH;  Surgeon: Paralee Cancel, MD;  Location: [facility];  Service: Orthopedics;  Laterality: Right;   TOTAL HIP ARTHROPLASTY Left 01/10/2017   Procedure: LEFT TOTAL HIP ARTHROPLASTY ANTERIOR APPROACH;  Surgeon: Paralee Cancel, MD;  Location: [facility];  Service: Orthopedics;  Laterality: Left;   TOTAL KNEE ARTHROPLASTY Left 09/28/2015   Procedure: LEFT TOTAL KNEE ARTHROPLASTY;  Surgeon: Paralee Cancel, MD;  Location: [facility];  Service: Orthopedics;  Laterality: Left;   TOTAL KNEE ARTHROPLASTY Right  08/09/2016   Procedure: RIGHT TOTAL KNEE ARTHROPLASTY;  Surgeon: Paralee Cancel, MD;  Location: [facility];  Service: Orthopedics;  Laterality: Right;  Adductor Block   Total Left hip arthroplasty     01/10/17 Dr. Alvan Dame   Family History  Problem Relation Age of Onset   Diabetes Father    Hypertension Father    Stroke Father    Breast cancer Cousin    Pneumonia Sister     Social History   Tobacco Use   Smoking status: Former    Packs/day: 1.00    Years: 20.00    Pack years: 20.00    Types: Cigarettes    Quit date: 07/26/1987    Years since quitting: 33.6   Smokeless  tobacco: Never  Substance Use Topics   Alcohol use: No    Alcohol/week: 0.0 standard drinks  Marital Status: Married   ROS    Review of Systems  Constitutional: Negative for diaphoresis and weight gain.  Cardiovascular:  Positive for dyspnea on exertion (mild) and leg swelling (intermittent, stable and mild). Negative for chest pain, orthopnea, palpitations and syncope.  Respiratory:  Positive for cough.   Musculoskeletal:  Positive for arthritis and joint pain. Negative for joint swelling.  Gastrointestinal:  Negative for nausea and vomiting.  Neurological:  Positive for loss of balance. Negative for light-headedness.   Objective  Blood pressure (!) 173/94, pulse 70, temperature (!) 97.2 F (36.2 C), temperature source Temporal, resp. rate 16, height '5\' 4"'$  (1.626 m), weight 117 lb (53.1 kg), last menstrual period 07/25/1988, SpO2 98 %.  Vitals with BMI 03/18/2021 02/24/2021 02/08/2021  Height '5\' 4"'$  '5\' 4"'$  -  Weight 117 lbs 123 lbs 3 oz 115 lbs 15 oz  BMI 20.07 [id number]  Systolic [id number]  Diastolic 94 82 93  Pulse 70 71 67    Orthostatic VS for the past 72 hrs (Last 3 readings):  Orthostatic BP Patient Position BP Location Cuff Size Orthostatic Pulse  03/18/21 1031 179/84 Standing Left Arm Normal 72  03/18/21 1030 (!) 178/95 Sitting Left Arm Normal 75  03/18/21 1029 (!) 179/101 Supine Left Arm Normal 72   Physical Exam Constitutional:      General: She is not in acute distress.    Appearance: She is ill-appearing.     Comments: Moderately build, frail  Neck:     Vascular: No carotid bruit or JVD.  Cardiovascular:     Rate and Rhythm: Normal rate and regular rhythm.     Pulses: Intact distal pulses.          Radial pulses are 2+ on the right side and 2+ on the left side.       Dorsalis pedis pulses are 1+ on the right side and 1+ on the left side.       Posterior tibial pulses are 1+ on the right side and 1+ on the left side.     Heart sounds: S1 normal and S2 normal.  Murmur heard.  High-pitched blowing holosystolic murmur is present with a grade of 2/6 at the apex.  High-pitched blowing decrescendo early diastolic murmur is present with a grade of 3/4 at the upper right sternal border radiating to the apex.    No gallop.  Pulmonary:     Effort: No accessory muscle usage or respiratory distress.     Breath sounds: Examination of the right-lower field reveals rhonchi. Examination of the left-lower field reveals rhonchi. Rhonchi (bibasilar coarse crackles, right worse, at the bases) present.  Musculoskeletal:        General: Swelling (trace edema) present.     Cervical back: Neck supple.   Laboratory examination:   Recent Labs    03/27/20 1458 06/23/20 1100 08/17/20 1026 09/09/20 1319 10/28/20 1034 02/06/21 0828 02/07/21 0357 02/08/21 0340  NA 139   < > 139 143   < > 141 139 139  K 3.7   < > 3.4* 4.2   < > 3.2* 3.1* 4.2  CL 104   < > 96 104   < > 103 99 101  CO2 26   < > 28 23   < > '27 31 30  '$ GLUCOSE 100*   < > 89 81   < > 86 87 96  BUN 17   < > 28* 22   < > 30* 29* 32*  CREATININE 0.99   < > 1.56* 0.78   < > 1.08* 1.14* 1.26*  CALCIUM 7.9*   < > 9.1 9.2   < > 8.8* 8.4* 8.9  GFRNONAA 53*   < > 31* 71   < > 51* 48* 43*  GFRAA >60  --  35* 82  --   --   --   --    < > = values in this interval not displayed.   CrCl cannot be calculated (Patient's most recent lab result is older than the maximum 21 days allowed.).  CMP Latest Ref Rng & Units 02/08/2021 02/07/2021 02/06/2021  Glucose 70 - 99 mg/dL 96 87 86  BUN 8 - 23 mg/dL 32(H) 29(H) 30(H)  Creatinine 0.44 - 1.00 mg/dL 1.26(H) 1.14(H) 1.08(H)  Sodium 135 - 145 mmol/L 139 139 141  Potassium 3.5 - 5.1 mmol/L 4.2 3.1(L) 3.2(L)  Chloride 98 - 111 mmol/L 101 99 103  CO2 22 - 32 mmol/L '30 31 27  '$ Calcium 8.9 - 10.3 mg/dL 8.9 8.4(L) 8.8(L)  Total Protein 6.5 - 8.1 g/dL - - 6.1(L)  Total Bilirubin 0.3 - 1.2 mg/dL - - 0.9  Alkaline Phos 38 - 126 U/L - - 45  AST 15 - 41 U/L - - 15  ALT 0 - 44 U/L -  - 18   CBC Latest Ref Rng & Units 02/08/2021 02/07/2021 02/06/2021  WBC 4.0 - 10.5 K/uL 6.7 7.6 7.8  Hemoglobin 12.0 - 15.0 g/dL 12.8 12.3 13.2  Hematocrit 36.0 - 46.0 % 40.3 38.4 41.4  Platelets 150 - 400 K/uL 204 174 160   Lipid Panel     Component Value Date/Time   CHOL 142 07/31/2018 0655   TRIG 72 07/31/2018 0655   HDL 45 07/31/2018 0655   CHOLHDL 3.2 07/31/2018 0655   VLDL 14 07/31/2018 0655   LDLCALC 83 07/31/2018 0655   HEMOGLOBIN A1C Lab Results  Component Value Date   HGBA1C 5.0 07/31/2018   MPG 96.8 07/31/2018   TSH Recent Labs    03/21/20 2329 01/26/21 1028  TSH 5.095* 17.100*   BNP (last 3 results) Recent Labs    08/17/20 1026 09/09/20 1320 02/06/21 0828  BNP 327.1* 1,136.5* 1,741.5*   External labs:   Lipid Panel completed 04/09/2019 HDL 38 MG/DL 04/09/2019 LDL 80.000 mg 04/09/2019 Cholesterol, total 143.000 m 04/09/2019 Triglycerides 127.000 04/09/2019  A1C 5.000 07/31/2018  Glucose Random 110.000 m 11/04/2019 MicroAlbumin Urine 9.000 04/25/2019 MicroAlbumin/Creat 16.8 MG/ 04/25/2019 BUN 18.000 mg 11/04/2019 Creatinine, Serum 0.700 mg/ 11/04/2019  TSH 2.620 micr 11/04/2019  Medications and allergies   Allergies  Allergen Reactions   Codeine Nausea Only  Epinephrine     Tachcardyia, chest pains tremors   Tenormin [Atenolol] Rash    Outpatient Medications Prior to Visit  Medication Sig Dispense Refill   acetaminophen (TYLENOL) 500 MG tablet Take 1,000 mg by mouth 3 (three) times daily as needed for moderate pain.      amiodarone (PACERONE) 200 MG tablet TAKE 1 TABLET BY MOUTH EVERY DAY 90 tablet 1   cephALEXin (KEFLEX) 500 MG capsule Take 1 capsule (500 mg total) by mouth 2 (two) times daily. Take 1 tablet by mouth tonight, then one tomorrow AM and one in PM 6 capsule 0   furosemide (LASIX) 40 MG tablet Take 1 tablet (40 mg total) by mouth every other day. Take daily if weight up by 3 Lbs or shortness of breath (Patient taking differently: Take 40  mg by mouth. Taking every 3 days.Take daily if weight up by 3 Lbs or shortness of breath) 90 tablet 1   levothyroxine (SYNTHROID, LEVOTHROID) 112 MCG tablet Take 112-168 mcg by mouth daily before breakfast. Take 1 tablet (112 mcg) on Mondays, Tuesdays, Wednesdays, Thursdays, Fridays & Saturdays. Take 1.5 tablet (168 mcg) on Sundays     losartan (COZAAR) 50 MG tablet TAKE 1 TABLET BY MOUTH EACH NIGHT AT BEDTIME 90 tablet 1   metoprolol succinate (TOPROL-XL) 25 MG 24 hr tablet Take 1 tablet (25 mg total) by mouth daily. 90 tablet 1   pantoprazole (PROTONIX) 40 MG tablet Take 40 mg by mouth daily as needed (indigestion/heartburn.).      potassium chloride (KLOR-CON) 10 MEQ tablet Take 2 tablets (20 mEq total) by mouth every other day. Take every time with furosemide (Patient taking differently: Take 20 mEq by mouth. Taking every three days with furosemide) 180 tablet 1   No facility-administered medications prior to visit.    Radiology:   No results found.  Cardiac Studies:    Lexiscan Myoview stress test  04/23/2018: 1. The resting electrocardiogram demonstrated normal sinus rhythm, normal resting conduction and no resting arrhythmias.  Stress EKG is non-diagnostic for ischemia as it a pharmacologic stress using Lexiscan. Occasional PAC and PVC. The patient developed significant symptoms which included Chest Pain, Stomach pain, Headache.  2. The overall quality of the study is good. There is no evidence of abnormal lung activity. Stress and rest SPECT images demonstrate homogeneous tracer distribution throughout the myocardium. Gated SPECT imaging reveals diffuse global hypokinesis. The left ventricular ejection fraction was markedly depressed at (26%).   3. This is a high risk study due to severe LV systolic dysfunction. Findings may represent non ischemic cardiomyopathy.  PCV ECHOCARDIOGRAM COMPLETE 03/04/2021  Narrative Echocardiogram 03/04/2021: Left ventricle cavity is normal in size.  Moderate concentric hypertrophy of the left ventricle. Severe global hypokinesis. LV systolic function with visual EF 25-30%. Diastolic function not assessed due to severity of valvular regurgitation. Left atrial cavity is severely dilated. Structurally normal trileaflet aortic valve. No evidence of aortic stenosis. Moderate (Grade III) aortic regurgitation. Moderate (Grade III) mitral regurgitation. Moderate tricuspid regurgitation. Moderate pulmonic regurgitation. No evidence of pulmonary hypertension. No significant change compared to previous study on 07/23/2020.     EKG  EKG 03/18/2021: Normal sinus rhythm at rate of 71 bpm, left axis deviation, left anterior fascicular block.  Poor R progression, cannot exclude anteroseptal infarct old.  LVH by voltage in aVL.  Nonspecific T abnormality.  No significant change from 07/14/2020.   EKG 03/25/2020: Atrial fibrillation with rapid ventricular response at the rate of 140 bpm, left axis deviation, left  intrafascicular block.  Poor R wave progression, cannot exclude anteroseptal infarct old.  Nonspecific ST-T abnormality, cannot exclude lateral ischemia.   Assessment     ICD-10-CM   1. Chronic systolic heart failure (HCC)  I50.22 EKG 12-Lead    2. Nonischemic cardiomyopathy (HCC)  I42.8     3. Orthostatic hypotension  I95.1     4. AF (paroxysmal atrial fibrillation) (HCC)  I48.0     5. DNR (do not resuscitate)  Z66       CHA2DS2-VASc Score is 5.  Yearly risk of stroke: 7.2% (A, F, HTN, CHF).  Score of 1=0.6; 2=2.2; 3=3.2; 4=4.8; 5=7.2; 6=9.8; 7=>9.8) -(CHF; HTN; vasc disease DM,  Female = 1; Age <65 =0; 65-74 = 1,  >75 =2; stroke/embolism= 2).   No orders of the defined types were placed in this encounter.   There are no discontinued medications.   Orders Placed This Encounter  Procedures   EKG 12-Lead    Recommendations:   Heidi Adams  is a 85 y.o. female  with bilateral hip replacement and knee replacement and left breast  lumpectomy in 2018 without chemotherapy or radiation therapy for breast cancer and also remote hysterectomy due to uterine cancer in 1991, supine hypertension and orthostatic hypotension, paroxysmal atrial fibrillation on chronic Eliquis, chronic dizziness on standing orthostatic hypotension. Has not been able to tolerate any BP medications due to severe dizzy spells.  She also has history of A. fib with RVR SP direct-current cardioversion due to acute decompensated heart failure with severely systolic dysfunction on 123456 and also GI bleed at the time.  She is now tolerating Eliquis.    Patient is presently doing well and has not had any recent hospitalizations, worsening dyspnea or worsening leg edema.  In view of severe orthostatic hypotension, she is not on guideline directed medical therapy for heart failure.  Patient also is very reluctant to taking any medications but fortunately has been compliant with losartan and low-dose metoprolol along with Eliquis.  No bleeding diathesis.  Day on exam she is very well compensated without leg edema, no JVD or hepatomegaly.  Except for bibasilar coarse crackles which are probably chronic in her lungs, lungs are fairly clear.  I discussed with the patient and her husband regarding advanced care planning.  After discussion she was made DNR but wants to be aggressive medical therapy if reversible causes are present to be treated.  Document ACP created and a copy given to the patient.  I would like to see her back in 3 months for follow-up.    Adrian Prows, MD, [facility] 03/20/2021, [place] PM Office: [phone]

## 2021-03-25 ENCOUNTER — Other Ambulatory Visit: Payer: Self-pay | Admitting: Hematology and Oncology

## 2021-03-25 DIAGNOSIS — Z9889 Other specified postprocedural states: Secondary | ICD-10-CM

## 2021-03-26 DIAGNOSIS — D692 Other nonthrombocytopenic purpura: Secondary | ICD-10-CM | POA: Diagnosis not present

## 2021-03-26 DIAGNOSIS — N390 Urinary tract infection, site not specified: Secondary | ICD-10-CM | POA: Diagnosis not present

## 2021-03-28 DIAGNOSIS — I480 Paroxysmal atrial fibrillation: Secondary | ICD-10-CM | POA: Diagnosis not present

## 2021-03-28 DIAGNOSIS — I10 Essential (primary) hypertension: Secondary | ICD-10-CM | POA: Diagnosis not present

## 2021-03-28 DIAGNOSIS — I5032 Chronic diastolic (congestive) heart failure: Secondary | ICD-10-CM | POA: Diagnosis not present

## 2021-03-29 DIAGNOSIS — I110 Hypertensive heart disease with heart failure: Secondary | ICD-10-CM | POA: Diagnosis not present

## 2021-04-02 DIAGNOSIS — R300 Dysuria: Secondary | ICD-10-CM | POA: Diagnosis not present

## 2021-04-02 DIAGNOSIS — N390 Urinary tract infection, site not specified: Secondary | ICD-10-CM | POA: Diagnosis not present

## 2021-04-02 DIAGNOSIS — R2232 Localized swelling, mass and lump, left upper limb: Secondary | ICD-10-CM | POA: Diagnosis not present

## 2021-04-06 DIAGNOSIS — Z87448 Personal history of other diseases of urinary system: Secondary | ICD-10-CM | POA: Diagnosis not present

## 2021-04-06 DIAGNOSIS — Z09 Encounter for follow-up examination after completed treatment for conditions other than malignant neoplasm: Secondary | ICD-10-CM | POA: Diagnosis not present

## 2021-04-16 DIAGNOSIS — E785 Hyperlipidemia, unspecified: Secondary | ICD-10-CM | POA: Diagnosis not present

## 2021-04-19 DIAGNOSIS — S41111D Laceration without foreign body of right upper arm, subsequent encounter: Secondary | ICD-10-CM | POA: Diagnosis not present

## 2021-04-25 ENCOUNTER — Other Ambulatory Visit: Payer: Self-pay

## 2021-04-25 ENCOUNTER — Encounter (HOSPITAL_COMMUNITY): Payer: Self-pay

## 2021-04-25 ENCOUNTER — Emergency Department (HOSPITAL_COMMUNITY): Payer: PPO

## 2021-04-25 ENCOUNTER — Observation Stay (HOSPITAL_COMMUNITY)
Admission: EM | Admit: 2021-04-25 | Discharge: 2021-04-27 | Disposition: A | Discharge status: Home / Self Care | Payer: PPO | Attending: Internal Medicine | Admitting: Internal Medicine

## 2021-04-25 DIAGNOSIS — R42 Dizziness and giddiness: Secondary | ICD-10-CM | POA: Diagnosis not present

## 2021-04-25 DIAGNOSIS — I6529 Occlusion and stenosis of unspecified carotid artery: Secondary | ICD-10-CM | POA: Diagnosis not present

## 2021-04-25 DIAGNOSIS — I480 Paroxysmal atrial fibrillation: Secondary | ICD-10-CM | POA: Diagnosis not present

## 2021-04-25 DIAGNOSIS — Z96643 Presence of artificial hip joint, bilateral: Secondary | ICD-10-CM | POA: Insufficient documentation

## 2021-04-25 DIAGNOSIS — Z87891 Personal history of nicotine dependence: Secondary | ICD-10-CM | POA: Insufficient documentation

## 2021-04-25 DIAGNOSIS — Z79899 Other long term (current) drug therapy: Secondary | ICD-10-CM | POA: Diagnosis not present

## 2021-04-25 DIAGNOSIS — Z20822 Contact with and (suspected) exposure to covid-19: Secondary | ICD-10-CM | POA: Diagnosis not present

## 2021-04-25 DIAGNOSIS — Z85828 Personal history of other malignant neoplasm of skin: Secondary | ICD-10-CM | POA: Diagnosis not present

## 2021-04-25 DIAGNOSIS — I4891 Unspecified atrial fibrillation: Secondary | ICD-10-CM | POA: Diagnosis present

## 2021-04-25 DIAGNOSIS — G319 Degenerative disease of nervous system, unspecified: Secondary | ICD-10-CM | POA: Diagnosis not present

## 2021-04-25 DIAGNOSIS — E039 Hypothyroidism, unspecified: Secondary | ICD-10-CM | POA: Diagnosis not present

## 2021-04-25 DIAGNOSIS — Z853 Personal history of malignant neoplasm of breast: Secondary | ICD-10-CM | POA: Insufficient documentation

## 2021-04-25 DIAGNOSIS — I5043 Acute on chronic combined systolic (congestive) and diastolic (congestive) heart failure: Secondary | ICD-10-CM | POA: Diagnosis not present

## 2021-04-25 DIAGNOSIS — Z96653 Presence of artificial knee joint, bilateral: Secondary | ICD-10-CM | POA: Diagnosis not present

## 2021-04-25 DIAGNOSIS — Y900 Blood alcohol level of less than 20 mg/100 ml: Secondary | ICD-10-CM | POA: Diagnosis not present

## 2021-04-25 DIAGNOSIS — N390 Urinary tract infection, site not specified: Secondary | ICD-10-CM | POA: Diagnosis not present

## 2021-04-25 DIAGNOSIS — R29818 Other symptoms and signs involving the nervous system: Secondary | ICD-10-CM | POA: Diagnosis not present

## 2021-04-25 DIAGNOSIS — Z8673 Personal history of transient ischemic attack (TIA), and cerebral infarction without residual deficits: Secondary | ICD-10-CM | POA: Diagnosis not present

## 2021-04-25 DIAGNOSIS — I6782 Cerebral ischemia: Secondary | ICD-10-CM | POA: Diagnosis not present

## 2021-04-25 DIAGNOSIS — M25559 Pain in unspecified hip: Secondary | ICD-10-CM

## 2021-04-25 DIAGNOSIS — I110 Hypertensive heart disease with heart failure: Secondary | ICD-10-CM | POA: Diagnosis not present

## 2021-04-25 DIAGNOSIS — R2681 Unsteadiness on feet: Secondary | ICD-10-CM | POA: Diagnosis not present

## 2021-04-25 DIAGNOSIS — I5022 Chronic systolic (congestive) heart failure: Secondary | ICD-10-CM | POA: Diagnosis present

## 2021-04-25 LAB — I-STAT CHEM 8, ED
BUN: 27 mg/dL — ABNORMAL HIGH (ref 8–23)
Calcium, Ion: 1.12 mmol/L — ABNORMAL LOW (ref 1.15–1.40)
Chloride: 104 mmol/L (ref 98–111)
Creatinine, Ser: 1.1 mg/dL — ABNORMAL HIGH (ref 0.44–1.00)
Glucose, Bld: 101 mg/dL — ABNORMAL HIGH (ref 70–99)
HCT: 38 % (ref 36.0–46.0)
Hemoglobin: 12.9 g/dL (ref 12.0–15.0)
Potassium: 3.7 mmol/L (ref 3.5–5.1)
Sodium: 136 mmol/L (ref 135–145)
TCO2: 24 mmol/L (ref 22–32)

## 2021-04-25 LAB — DIFFERENTIAL
Abs Immature Granulocytes: 0.07 10*3/uL (ref 0.00–0.07)
Basophils Absolute: 0 10*3/uL (ref 0.0–0.1)
Basophils Relative: 0 %
Eosinophils Absolute: 0 10*3/uL (ref 0.0–0.5)
Eosinophils Relative: 0 %
Immature Granulocytes: 1 %
Lymphocytes Relative: 6 %
Lymphs Abs: 0.9 10*3/uL (ref 0.7–4.0)
Monocytes Absolute: 0.8 10*3/uL (ref 0.1–1.0)
Monocytes Relative: 6 %
Neutro Abs: 11.9 10*3/uL — ABNORMAL HIGH (ref 1.7–7.7)
Neutrophils Relative %: 87 %

## 2021-04-25 LAB — URINALYSIS, ROUTINE W REFLEX MICROSCOPIC
Bilirubin Urine: NEGATIVE
Glucose, UA: NEGATIVE mg/dL
Ketones, ur: NEGATIVE mg/dL
Nitrite: NEGATIVE
Protein, ur: 100 mg/dL — AB
Specific Gravity, Urine: 1.01 (ref 1.005–1.030)
WBC, UA: >50 WBC/hpf — ABNORMAL HIGH (ref 0–5)
pH: 7 (ref 5.0–8.0)

## 2021-04-25 LAB — CBC
HCT: 39.6 % (ref 36.0–46.0)
Hemoglobin: 12.4 g/dL (ref 12.0–15.0)
MCH: 30 pg (ref 26.0–34.0)
MCHC: 31.3 g/dL (ref 30.0–36.0)
MCV: 95.9 fL (ref 80.0–100.0)
Platelets: 190 10*3/uL (ref 150–400)
RBC: 4.13 MIL/uL (ref 3.87–5.11)
RDW: 14.4 % (ref 11.5–15.5)
WBC: 13.7 10*3/uL — ABNORMAL HIGH (ref 4.0–10.5)
nRBC: 0 % (ref 0.0–0.2)

## 2021-04-25 LAB — COMPREHENSIVE METABOLIC PANEL
ALT: 10 U/L (ref 0–44)
AST: 14 U/L — ABNORMAL LOW (ref 15–41)
Albumin: 3.2 g/dL — ABNORMAL LOW (ref 3.5–5.0)
Alkaline Phosphatase: 54 U/L (ref 38–126)
Anion gap: 11 (ref 5–15)
BUN: 29 mg/dL — ABNORMAL HIGH (ref 8–23)
CO2: 23 mmol/L (ref 22–32)
Calcium: 9 mg/dL (ref 8.9–10.3)
Chloride: 105 mmol/L (ref 98–111)
Creatinine, Ser: 1.12 mg/dL — ABNORMAL HIGH (ref 0.44–1.00)
GFR, Estimated: 49 mL/min — ABNORMAL LOW (ref >60)
Glucose, Bld: 103 mg/dL — ABNORMAL HIGH (ref 70–99)
Potassium: 3.5 mmol/L (ref 3.5–5.1)
Sodium: 139 mmol/L (ref 135–145)
Total Bilirubin: 0.8 mg/dL (ref 0.3–1.2)
Total Protein: 6.3 g/dL — ABNORMAL LOW (ref 6.5–8.1)

## 2021-04-25 LAB — PROTIME-INR
INR: 1.1 (ref 0.8–1.2)
Prothrombin Time: 14.4 seconds (ref 11.4–15.2)

## 2021-04-25 LAB — RAPID URINE DRUG SCREEN, HOSP PERFORMED
Amphetamines: NOT DETECTED
Barbiturates: NOT DETECTED
Benzodiazepines: NOT DETECTED
Cocaine: NOT DETECTED
Opiates: NOT DETECTED
Tetrahydrocannabinol: NOT DETECTED

## 2021-04-25 LAB — APTT: aPTT: 23 seconds — ABNORMAL LOW (ref 24–36)

## 2021-04-25 LAB — RESP PANEL BY RT-PCR (FLU A&B, COVID) ARPGX2
Influenza A by PCR: NEGATIVE
Influenza B by PCR: NEGATIVE
SARS Coronavirus 2 by RT PCR: NEGATIVE

## 2021-04-25 LAB — ETHANOL: Alcohol, Ethyl (B): <10 mg/dL (ref <10)

## 2021-04-25 IMAGING — MR MR MRA HEAD W/O CM
2 series · 20 of 48 positions shown · IV contrast (gadavist)
Comparison: Head CT 04/25/2021. Head and neck CTA 07/30/2018. Head
MRI and head MRA 06/24/2019.

CLINICAL DATA: Dizziness, persistent/recurrent, cardiac or vascular
cause suspected; Neuro deficit, acute, stroke suspected.

EXAM:
MRI HEAD WITHOUT AND WITH CONTRAST
MRA HEAD WITHOUT CONTRAST
MRA NECK WITHOUT AND WITH CONTRAST
TECHNIQUE: Multiplanar, multiecho pulse sequences of the brain and surrounding
structures were obtained without and with intravenous contrast.
Angiographic images of the Circle of Willis were obtained using MRA
technique without intravenous contrast. Angiographic images of the
neck were obtained using MRA technique without and with intravenous
contrast. Carotid stenosis measurements (when applicable) are
obtained utilizing NASCET criteria, using the distal internal
carotid diameter as the denominator.
CONTRAST:  5mL GADAVIST GADOBUTROL 1 MMOL/ML IV SOLN

[Series 4: TOF · axial · 0.6mm · 0.35mm/px · z∈[-70,+25]mm · 19 of 172 slices shown (1 of 2)]
[im 1/172]
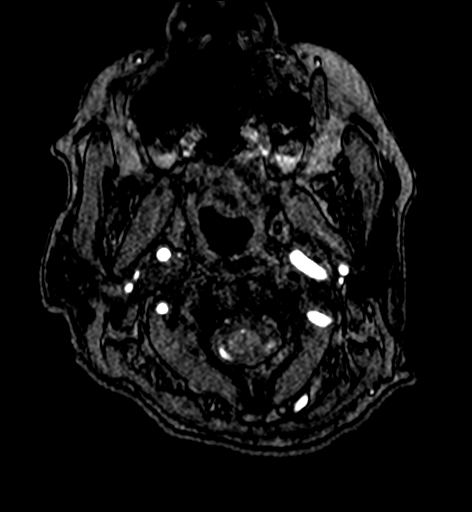
[im 4/172]
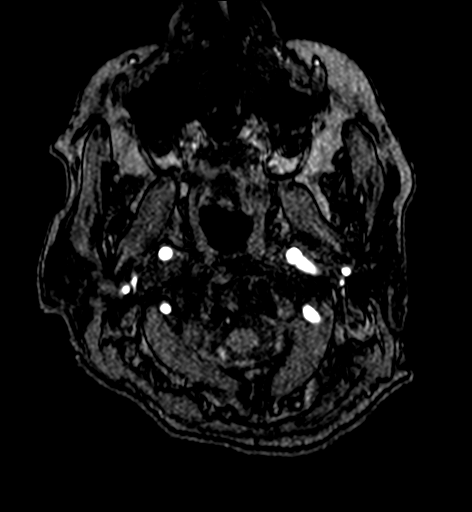
[im 8/172]
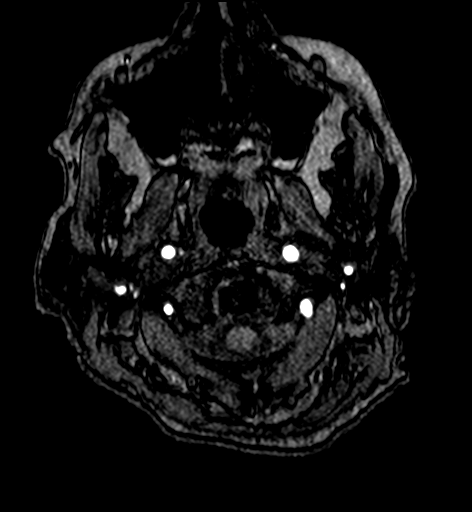
[im 12/172]
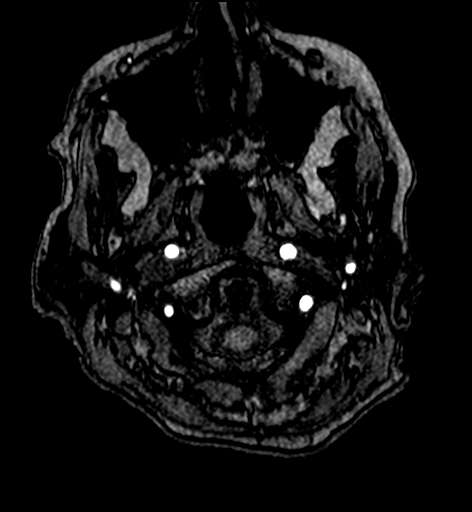
[im 15/172]
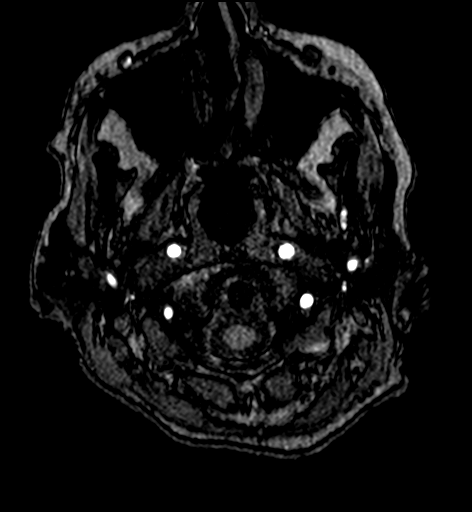
[im 19/172]
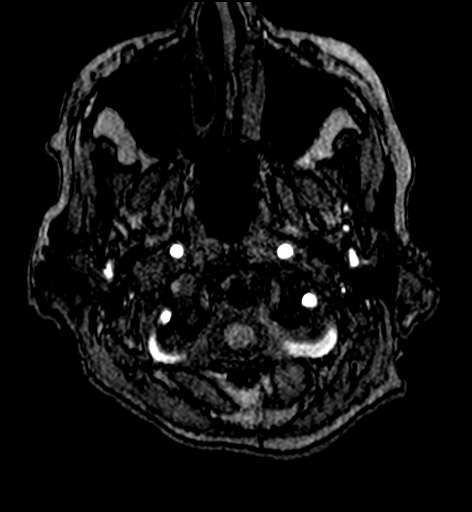
[im 23/172]
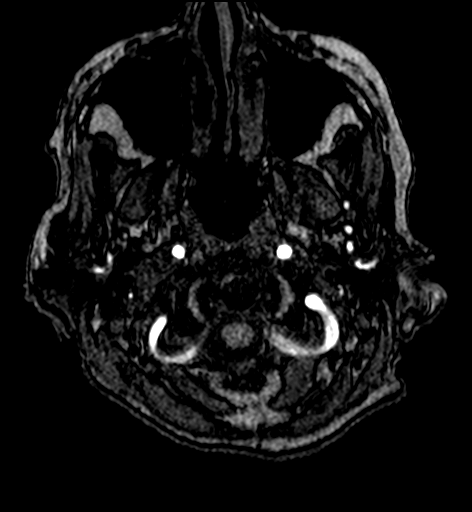
[im 27/172]
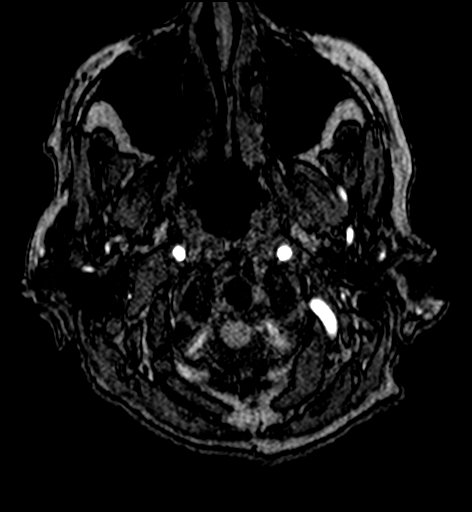
[im 30/172]
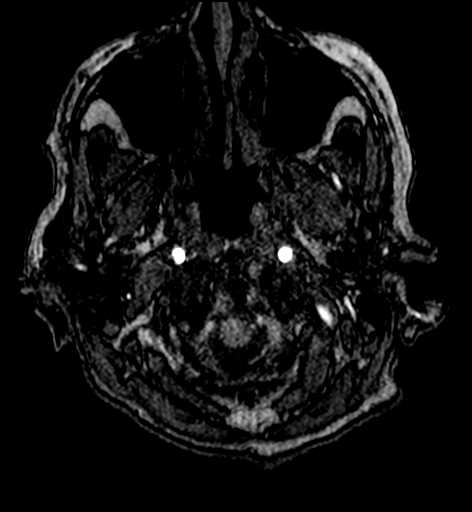
[im 34/172]
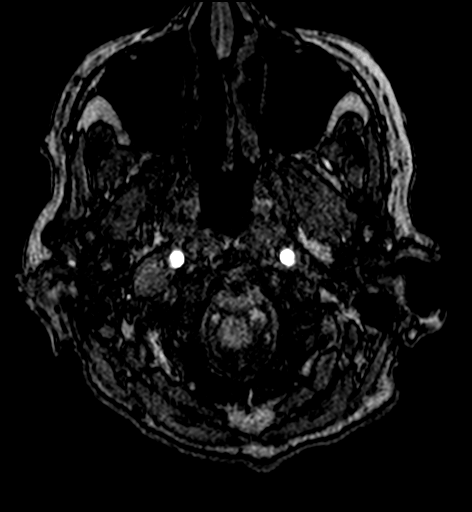
[im 38/172]
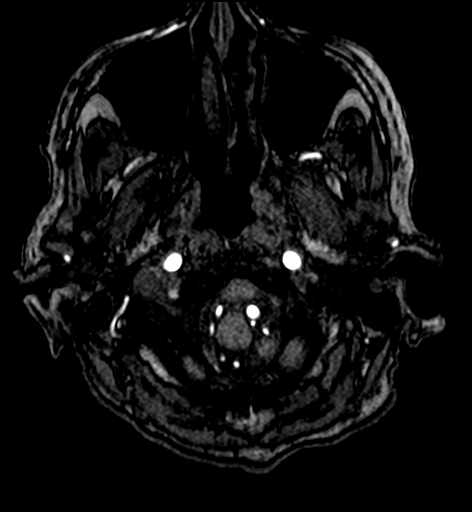
[im 53/172]
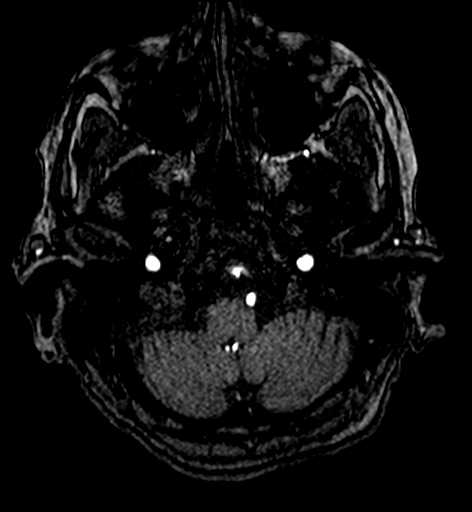
[im 75/172]
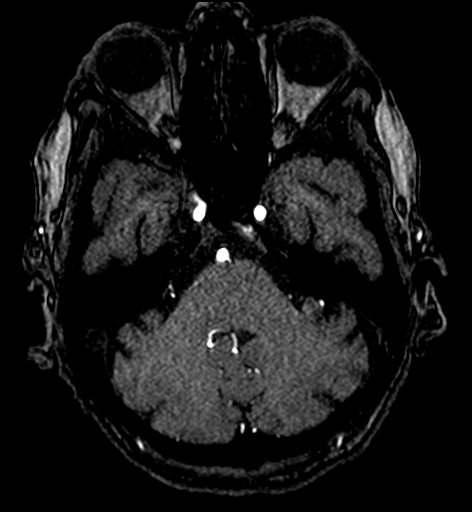
[im 86/172]
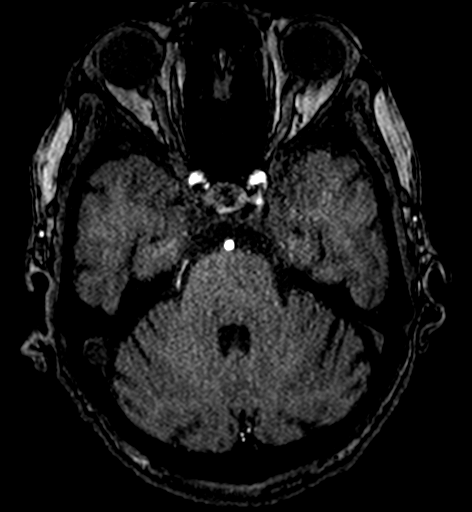
[im 97/172]
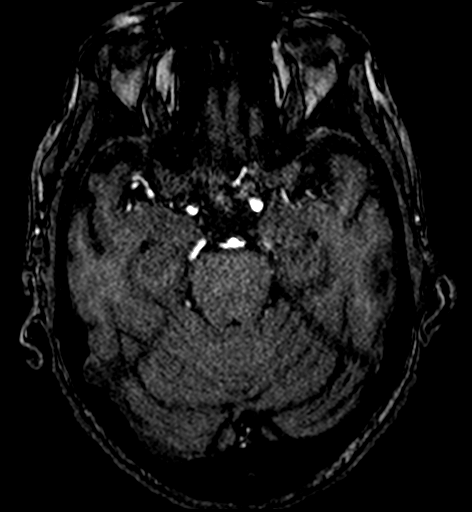
[im 119/172]
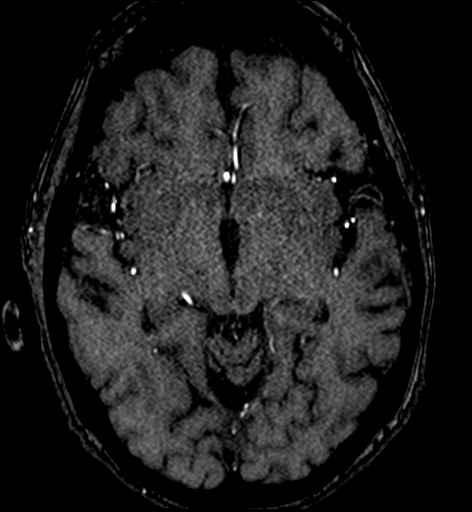
[im 142/172]
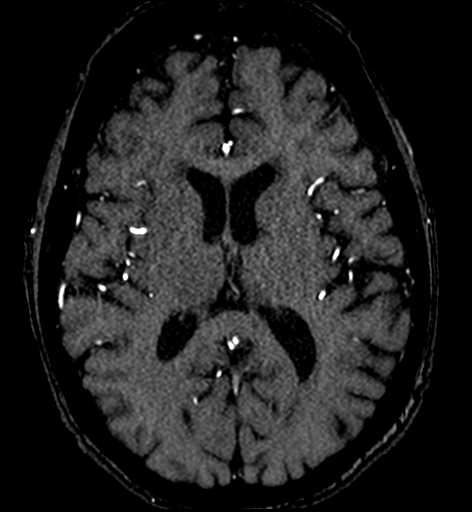
[im 145/172]
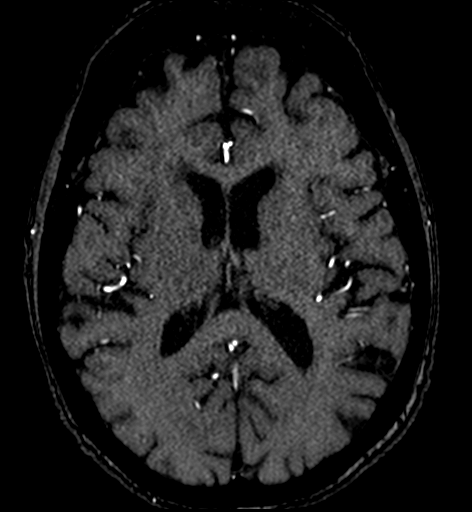
[im 164/172]
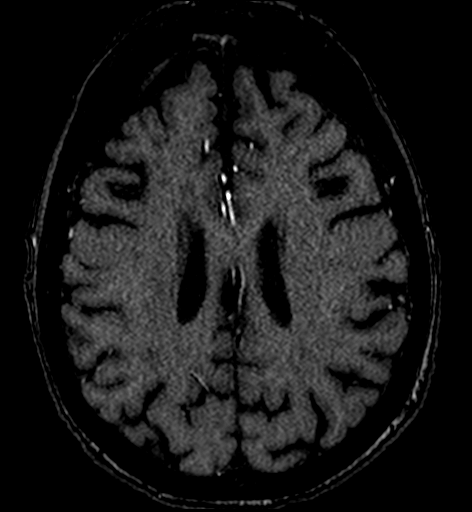

[Series 7: TOF · axial · 103.2mm · 0.35mm/px · 1 of 1 slices shown (2 of 2)]
[im 1/1]
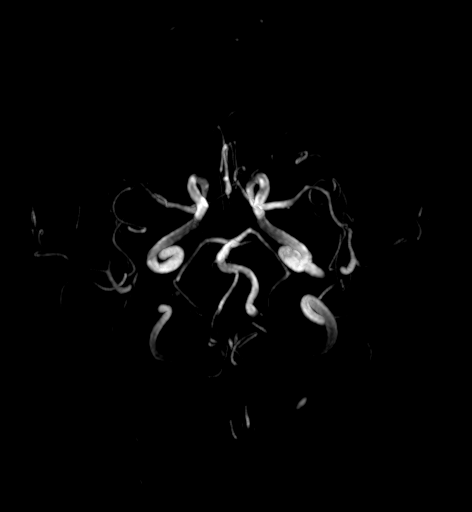

[20 of 48 positions shown; findings below may reference images not displayed]

FINDINGS: MRI HEAD FINDINGS

The study is intermittently moderately to severely motion degraded.

Brain: There is no evidence of an acute infarct, intracranial
hemorrhage, midline shift, or extra-axial fluid collection. T2
hyperintensities in the cerebral white matter bilaterally are
unchanged and nonspecific but compatible with mild-to-moderate
chronic small vessel ischemic disease. A 7 mm enhancing mass over
the left frontal convexity near the vertex is unchanged from the
prior MRI without mass effect on the underlying brain. Generalized
cerebral atrophy is mild for age.

Vascular: Major intracranial vascular flow voids are preserved.

Skull and upper cervical spine: Unremarkable bone marrow signal.

Sinuses/Orbits: Bilateral cataract extraction. Clear paranasal
sinuses. Trace right mastoid fluid.

Other: None.

MRA HEAD FINDINGS

Signal loss in the distal V3 and proximal V4 segments bilaterally is
likely artifactual. The more distal V4 segments are widely patent to
the basilar with the left being dominant. The basilar artery is
widely patent. Posterior communicating arteries are diminutive or
absent. Both PCAs are patent without evidence of a significant
proximal stenosis.

The internal carotid arteries are widely patent from skull base to
carotid termini. ACAs and MCAs are patent without evidence of a
proximal branch occlusion or significant proximal stenosis. No
aneurysm is identified.

MRA NECK FINDINGS

The study is degraded by moderate motion artifact as well as
suboptimal timing on the contrast-enhanced MRA. There is a standard
3 vessel aortic arch. The common carotid and cervical internal
carotid arteries are patent without evidence of a flow limiting
stenosis with motion artifact limiting assessment of the proximal
common carotid arteries. The vertebral arteries are patent with
antegrade flow bilaterally. Assessment for vertebral stenosis is
severely limited by motion and venous contamination.
IMPRESSION: 1. No acute intracranial abnormality.
2. Mild-to-moderate chronic small vessel ischemic disease.
3. Unchanged 7 mm left frontal vertex meningioma.
4. Negative head MRA.
5. Motion degraded neck MRA. Patent cervical carotid and vertebral
arteries.

## 2021-04-25 IMAGING — CT CT HEAD W/O CM
3 series · 15 of 47 positions shown, 18 images · non-contrast
Comparison: CT head dated June 22, 2019.

CLINICAL DATA: Unsteady gait.

EXAM:
CT HEAD WITHOUT CONTRAST
TECHNIQUE: Contiguous axial images were obtained from the base of the skull
through the vertex without intravenous contrast.

[Series 2: head wo · axial · 0.47mm/px · z∈[+1333,+1473]mm · 9 of 34 slices shown, 12 images]
[im 3/34  brain]
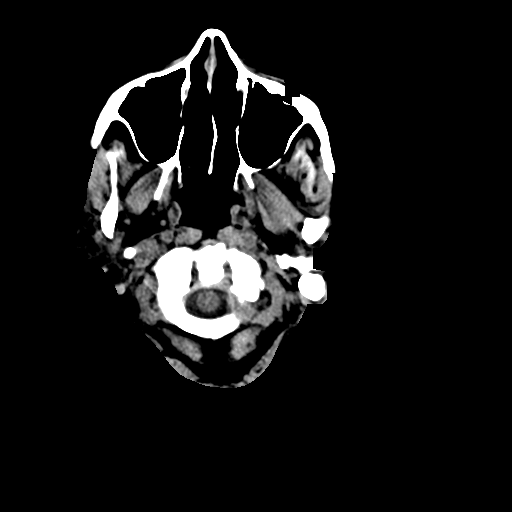
[im 3/34  bone]
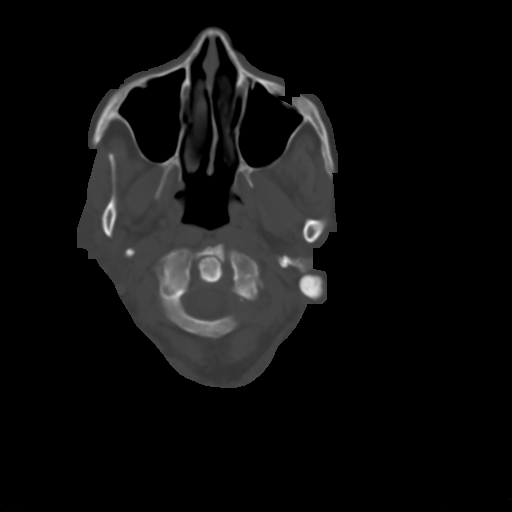
[im 6/34  brain]
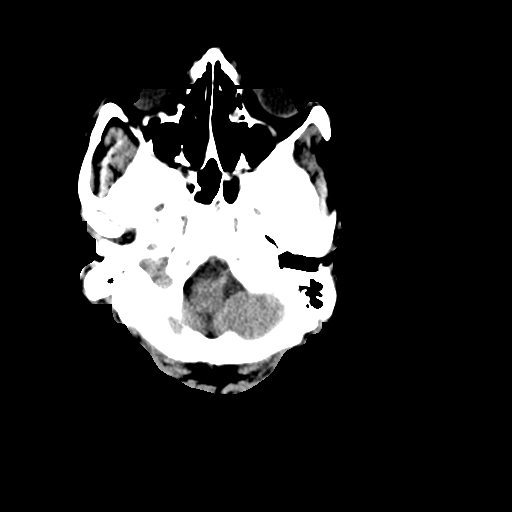
[im 10/34  brain]
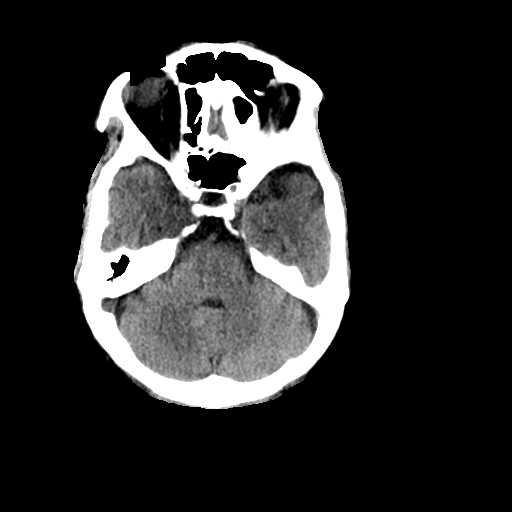
[im 13/34  brain]
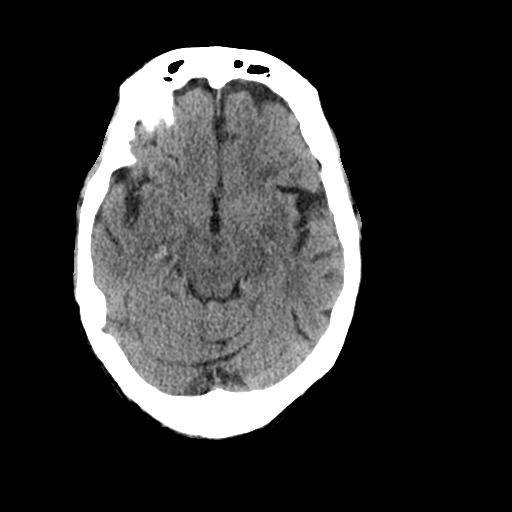
[im 18/34  brain]
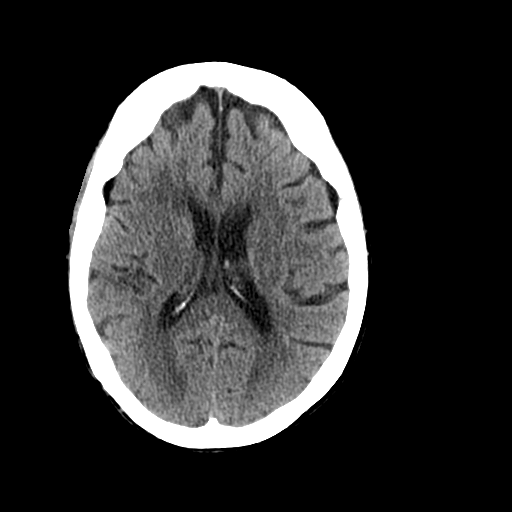
[im 18/34  bone]
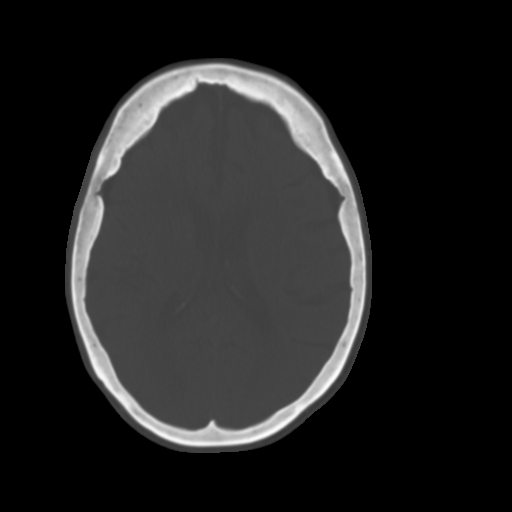
[im 21/34  brain]
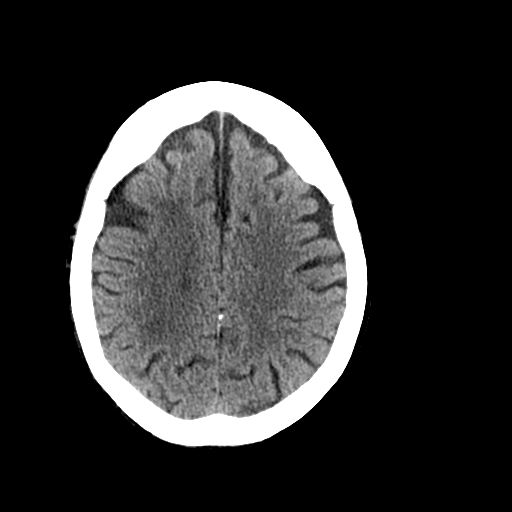
[im 24/34  brain]
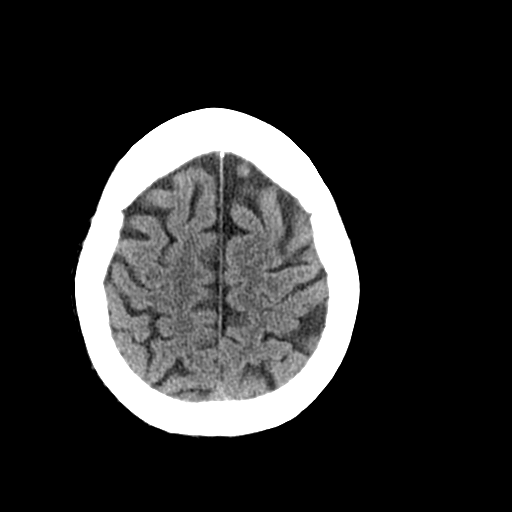
[im 28/34  brain]
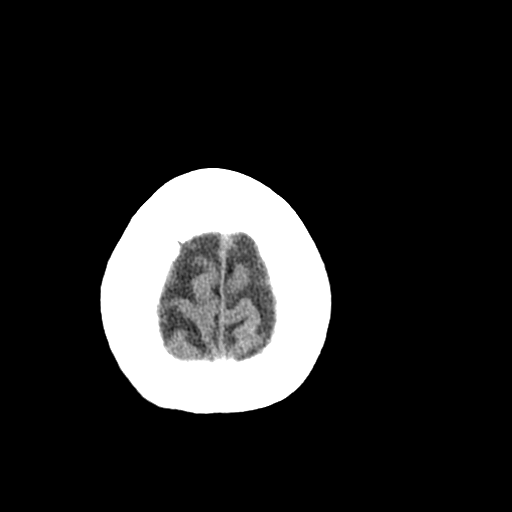
[im 31/34  brain]
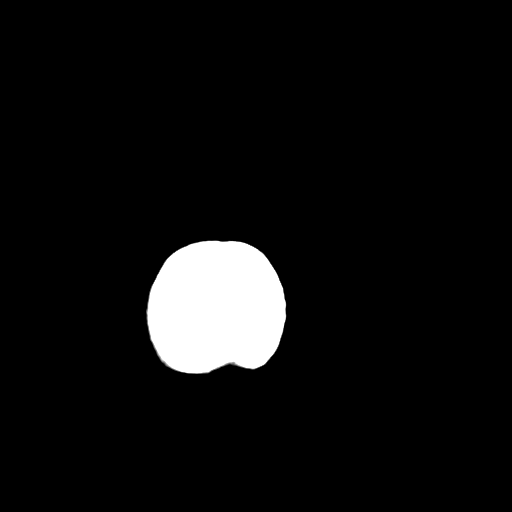
[im 31/34  bone]
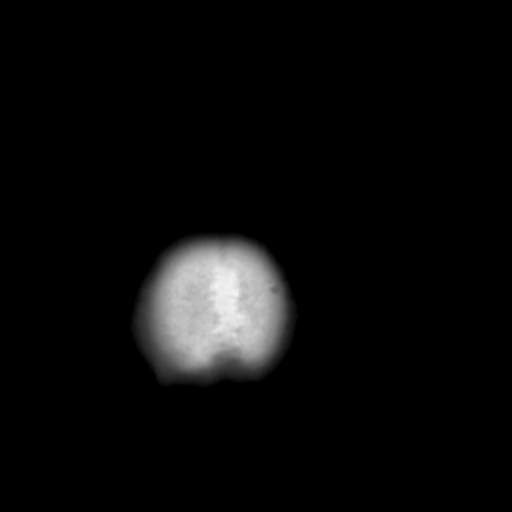

[Series 5: coronal soft tissue · coronal · 0.37mm/px · 3 of 70 slices shown]
[im 24/70  brain]
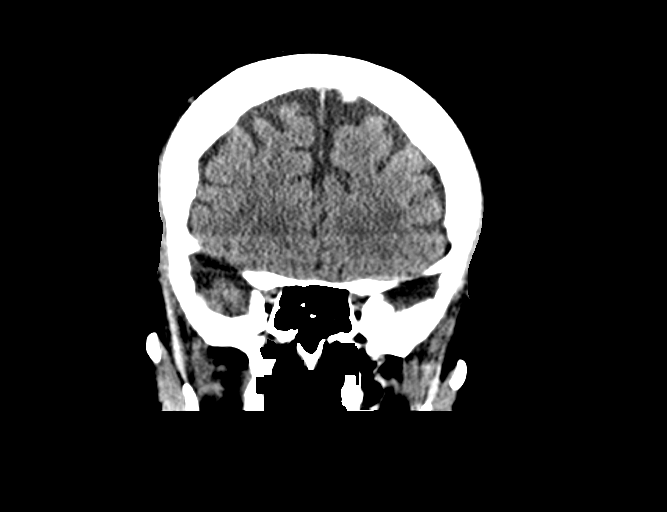
[im 31/70  brain]
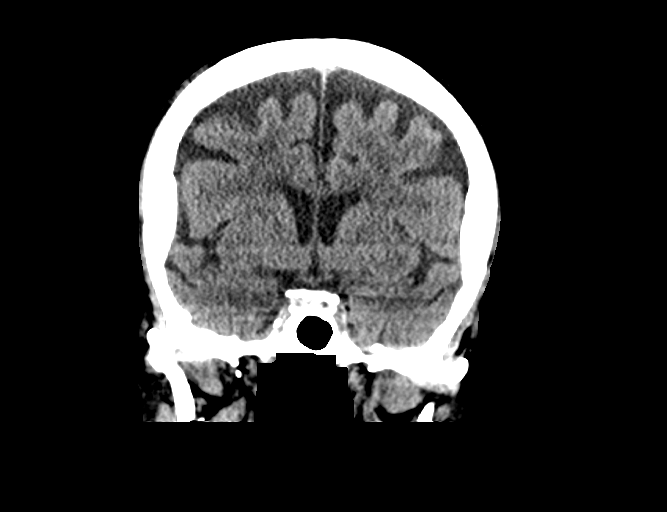
[im 39/70  brain]
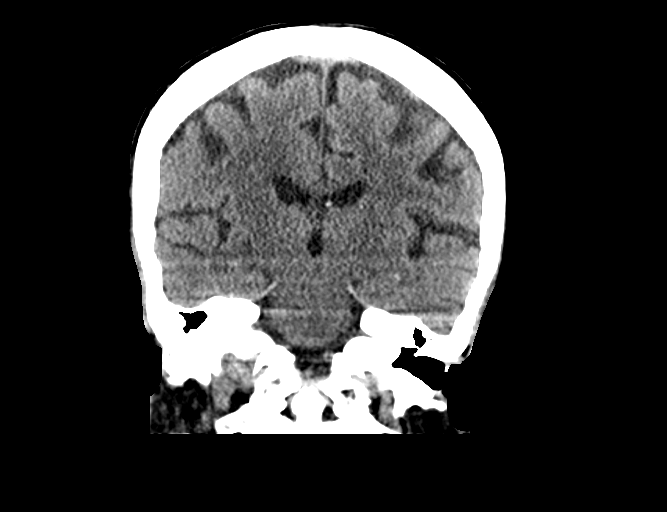

[Series 6: sagittal soft tissue · sagittal · 0.38mm/px · 3 of 54 slices shown]
[im 18/54  brain]
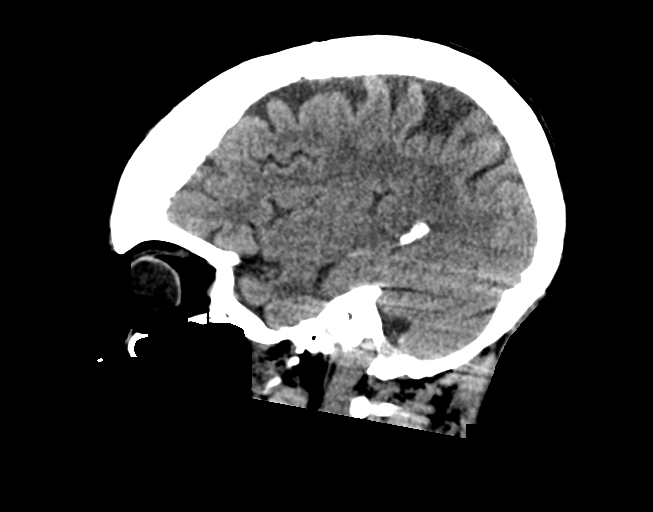
[im 27/54  brain]
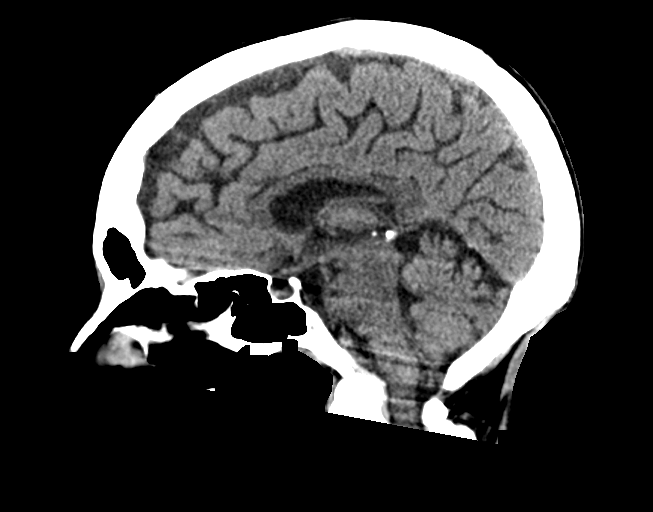
[im 36/54  brain]
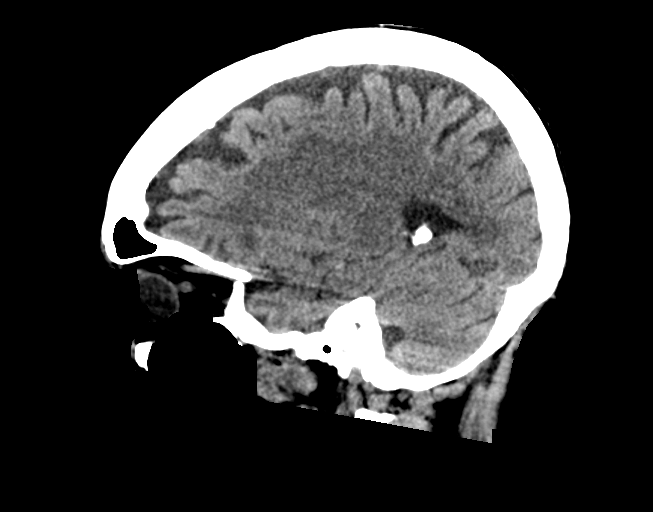

[15 of 47 positions shown; findings below may reference images not displayed]

FINDINGS: Brain: No evidence of acute infarction, hemorrhage, hydrocephalus,
extra-axial collection or mass lesion/mass effect. Progressive
mild-to-moderate chronic microvascular ischemic changes. Unchanged 6
mm meningioma at the left frontal vertex.

Vascular: Atherosclerotic vascular calcification of the carotid
siphons. No hyperdense vessel.

Skull: Normal. Negative for fracture or focal lesion.

Sinuses/Orbits: No acute finding.

Other: None.
IMPRESSION: 1. No acute intracranial abnormality.
2. Progressive mild-to-moderate chronic microvascular ischemic
changes.

## 2021-04-25 IMAGING — MR MR MRA NECK WO/W CM
4 of 7 series · 13 of 48 positions shown · IV contrast (gadavist)
Comparison: Head CT 04/25/2021. Head and neck CTA 07/30/2018. Head
MRI and head MRA 06/24/2019.

CLINICAL DATA: Dizziness, persistent/recurrent, cardiac or vascular
cause suspected; Neuro deficit, acute, stroke suspected.

EXAM:
MRI HEAD WITHOUT AND WITH CONTRAST
MRA HEAD WITHOUT CONTRAST
MRA NECK WITHOUT AND WITH CONTRAST
TECHNIQUE: Multiplanar, multiecho pulse sequences of the brain and surrounding
structures were obtained without and with intravenous contrast.
Angiographic images of the Circle of Willis were obtained using MRA
technique without intravenous contrast. Angiographic images of the
neck were obtained using MRA technique without and with intravenous
contrast. Carotid stenosis measurements (when applicable) are
obtained utilizing NASCET criteria, using the distal internal
carotid diameter as the denominator.
CONTRAST:  5mL GADAVIST GADOBUTROL 1 MMOL/ML IV SOLN

[Series 3: tof_2d_tra · axial · 3.5mm · 0.43mm/px · z∈[-230,-88]mm · 4 of 60 slices shown]
[im 1/60]
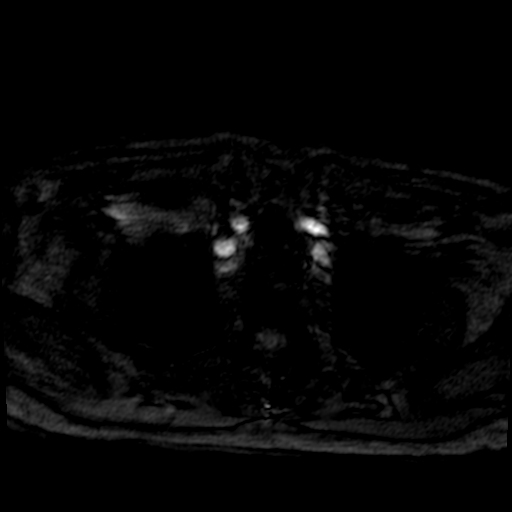
[im 20/60]
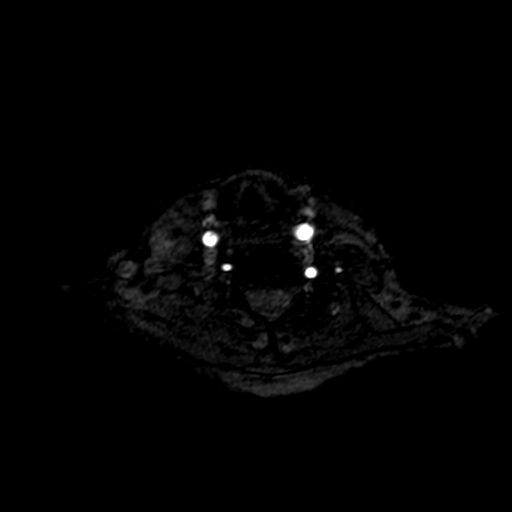
[im 40/60]
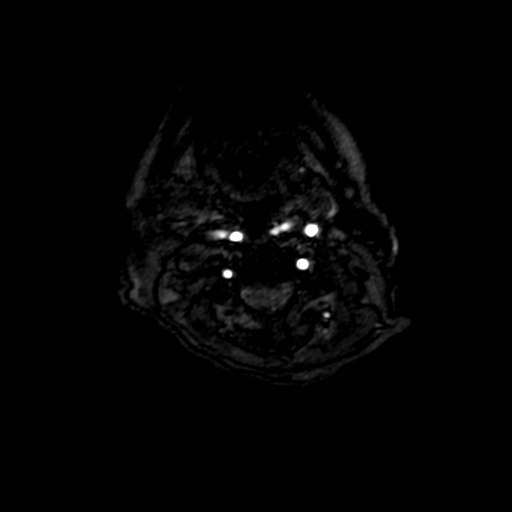
[im 60/60]
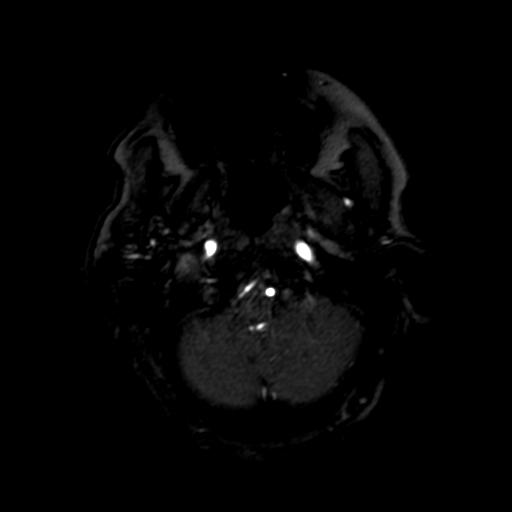

[Series 6: tof_2d_tra_mip_tra · axial · 148.1mm · 0.43mm/px · 1 of 1 slices shown]
[im 1/1]
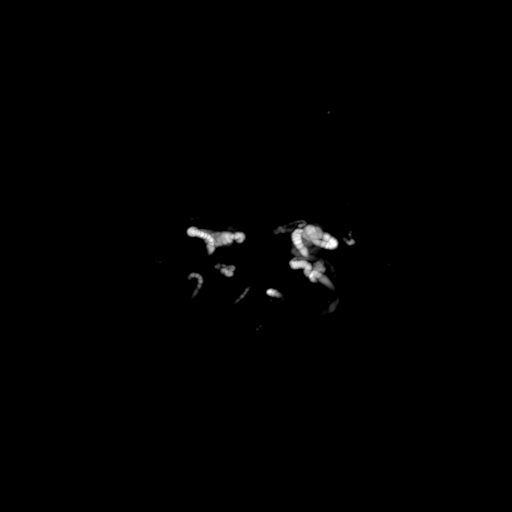

[Series 7: (id)_cor_pre · coronal · 0.8mm · 0.43mm/px · 5 of 96 slices shown]
[im 1/96]
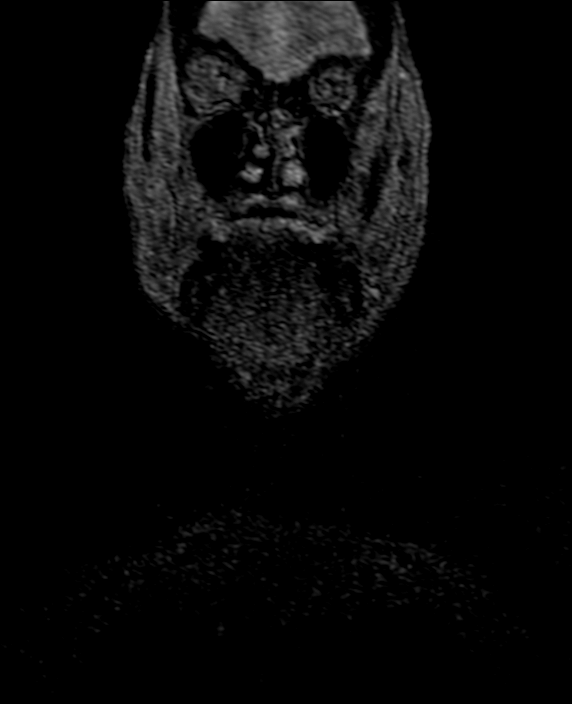
[im 14/96]
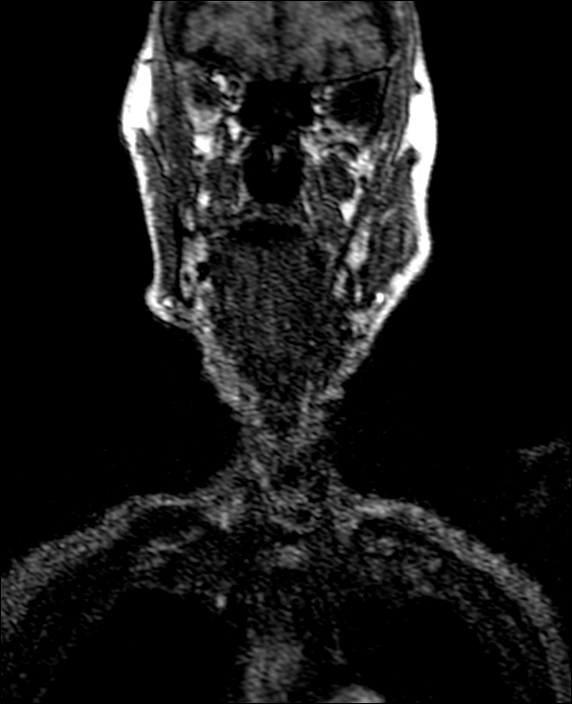
[im 28/96]
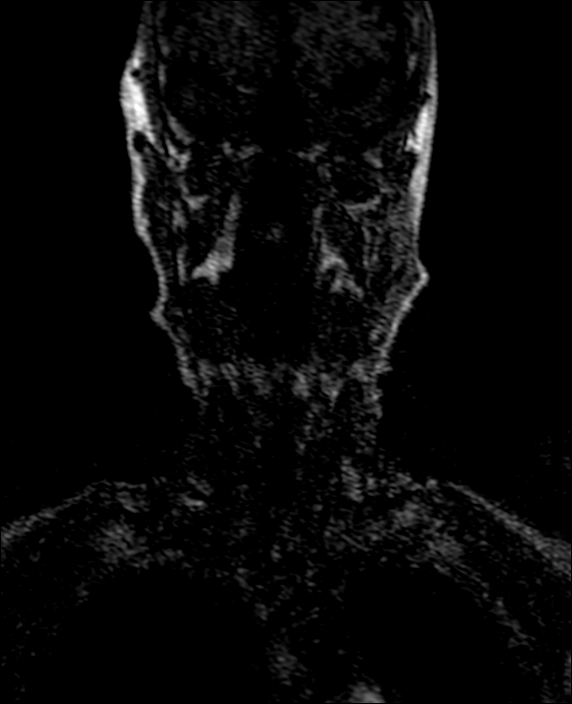
[im 55/96]
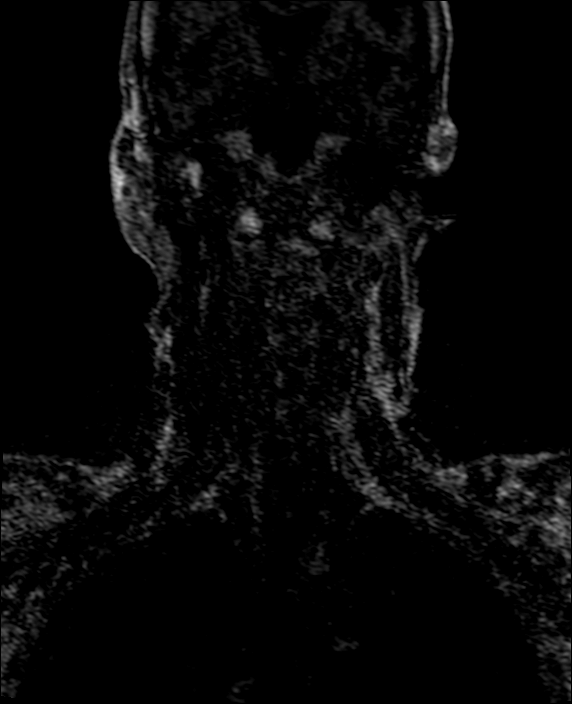
[im 82/96]
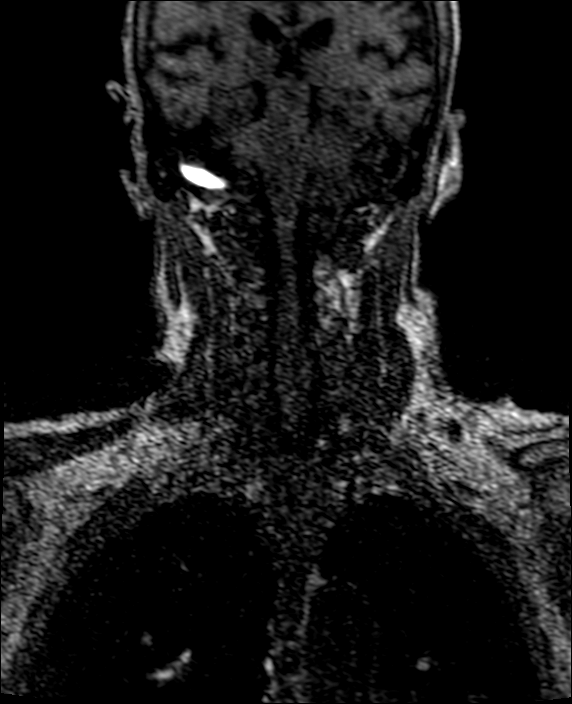

[Series 9: (id)_cor_post · coronal · 0.8mm · 0.43mm/px · 3 of 96 slices shown]
[im 12/96]
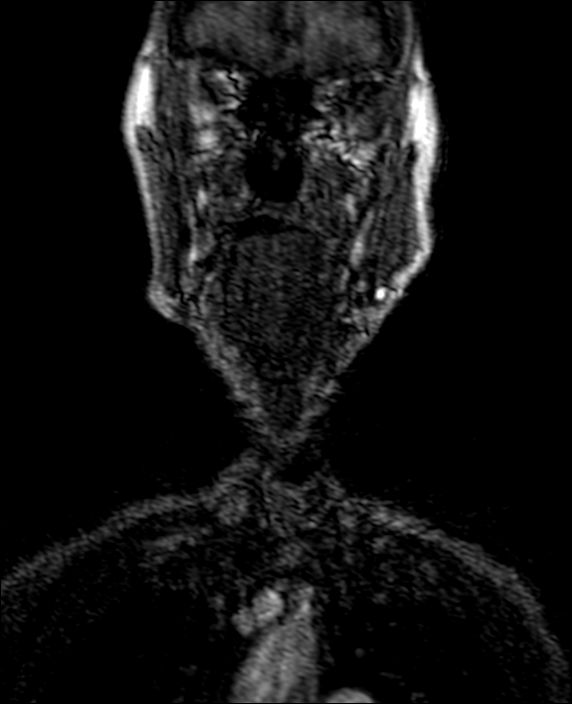
[im 48/96]
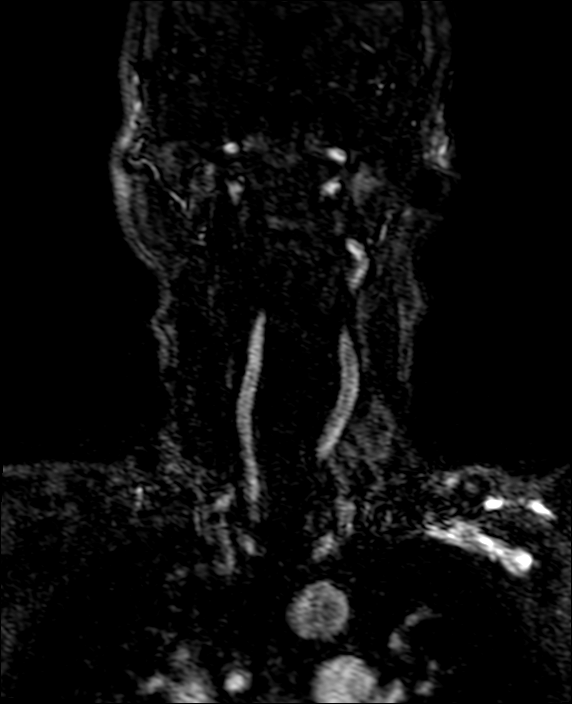
[im 84/96]
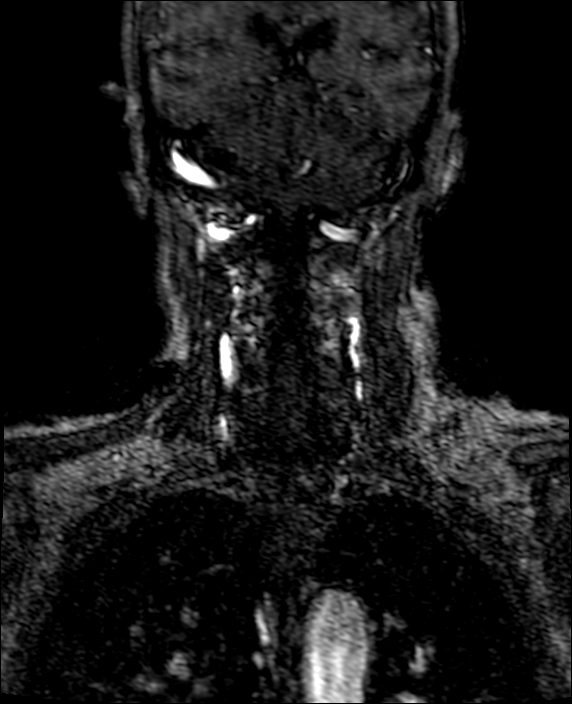

[13 of 48 positions shown; findings below may reference images not displayed]

FINDINGS: MRI HEAD FINDINGS

The study is intermittently moderately to severely motion degraded.

Brain: There is no evidence of an acute infarct, intracranial
hemorrhage, midline shift, or extra-axial fluid collection. T2
hyperintensities in the cerebral white matter bilaterally are
unchanged and nonspecific but compatible with mild-to-moderate
chronic small vessel ischemic disease. A 7 mm enhancing mass over
the left frontal convexity near the vertex is unchanged from the
prior MRI without mass effect on the underlying brain. Generalized
cerebral atrophy is mild for age.

Vascular: Major intracranial vascular flow voids are preserved.

Skull and upper cervical spine: Unremarkable bone marrow signal.

Sinuses/Orbits: Bilateral cataract extraction. Clear paranasal
sinuses. Trace right mastoid fluid.

Other: None.

MRA HEAD FINDINGS

Signal loss in the distal V3 and proximal V4 segments bilaterally is
likely artifactual. The more distal V4 segments are widely patent to
the basilar with the left being dominant. The basilar artery is
widely patent. Posterior communicating arteries are diminutive or
absent. Both PCAs are patent without evidence of a significant
proximal stenosis.

The internal carotid arteries are widely patent from skull base to
carotid termini. ACAs and MCAs are patent without evidence of a
proximal branch occlusion or significant proximal stenosis. No
aneurysm is identified.

MRA NECK FINDINGS

The study is degraded by moderate motion artifact as well as
suboptimal timing on the contrast-enhanced MRA. There is a standard
3 vessel aortic arch. The common carotid and cervical internal
carotid arteries are patent without evidence of a flow limiting
stenosis with motion artifact limiting assessment of the proximal
common carotid arteries. The vertebral arteries are patent with
antegrade flow bilaterally. Assessment for vertebral stenosis is
severely limited by motion and venous contamination.
IMPRESSION: 1. No acute intracranial abnormality.
2. Mild-to-moderate chronic small vessel ischemic disease.
3. Unchanged 7 mm left frontal vertex meningioma.
4. Negative head MRA.
5. Motion degraded neck MRA. Patent cervical carotid and vertebral
arteries.

## 2021-04-25 MED ORDER — ENOXAPARIN SODIUM 40 MG/0.4ML IJ SOSY: 40.0000 mg | PREFILLED_SYRINGE | INTRAMUSCULAR | Status: DC

## 2021-04-25 MED ORDER — GADOBUTROL 1 MMOL/ML IV SOLN
5.0000 mL | Freq: Once | INTRAVENOUS | Status: AC | PRN
  Administered 2021-04-25: 5 mL via INTRAVENOUS

## 2021-04-25 MED ORDER — SODIUM CHLORIDE 0.9 % IV SOLN
1.0000 g | INTRAVENOUS | Status: DC
  Administered 2021-04-25 – 2021-04-26 (×2): 1 g via INTRAVENOUS
  Filled 2021-04-25 (×2): qty 10

## 2021-04-25 MED ORDER — ACETAMINOPHEN 500 MG PO TABS
1000.0000 mg | ORAL_TABLET | Freq: Three times a day (TID) | ORAL | Status: DC | PRN
Start: 2021-04-25 — End: 2021-04-27
  Administered 2021-04-25 – 2021-04-27 (×4): 1000 mg via ORAL
  Filled 2021-04-25 (×5): qty 2

## 2021-04-25 MED ORDER — METOPROLOL SUCCINATE ER 25 MG PO TB24
25.0000 mg | ORAL_TABLET | Freq: Every day | ORAL | Status: DC
  Administered 2021-04-26 – 2021-04-27 (×2): 25 mg via ORAL
  Filled 2021-04-25 (×2): qty 1

## 2021-04-25 MED ORDER — FUROSEMIDE 40 MG PO TABS
40.0000 mg | ORAL_TABLET | ORAL | Status: DC
  Administered 2021-04-26: 40 mg via ORAL
  Filled 2021-04-25: qty 1

## 2021-04-25 MED ORDER — LEVOTHYROXINE SODIUM 112 MCG PO TABS
112.0000 ug | ORAL_TABLET | ORAL | Status: DC
  Administered 2021-04-26 – 2021-04-27 (×2): 112 ug via ORAL
  Filled 2021-04-25 (×2): qty 1

## 2021-04-25 MED ORDER — LOSARTAN POTASSIUM 25 MG PO TABS
25.0000 mg | ORAL_TABLET | Freq: Every day | ORAL | Status: DC
  Administered 2021-04-25 – 2021-04-26 (×2): 25 mg via ORAL
  Filled 2021-04-25 (×2): qty 1

## 2021-04-25 MED ORDER — IBUPROFEN 200 MG PO TABS: 600.0000 mg | ORAL_TABLET | Freq: Four times a day (QID) | ORAL | Status: DC | PRN

## 2021-04-25 MED ORDER — ONDANSETRON HCL 4 MG PO TABS: 4.0000 mg | ORAL_TABLET | Freq: Four times a day (QID) | ORAL | Status: DC | PRN

## 2021-04-25 MED ORDER — LEVOTHYROXINE SODIUM 112 MCG PO TABS: 112.0000 ug | ORAL_TABLET | Freq: Every day | ORAL | Status: DC

## 2021-04-25 MED ORDER — POTASSIUM CHLORIDE CRYS ER 20 MEQ PO TBCR
20.0000 meq | EXTENDED_RELEASE_TABLET | ORAL | Status: DC
  Administered 2021-04-26: 20 meq via ORAL
  Filled 2021-04-25: qty 1

## 2021-04-25 MED ORDER — AMIODARONE HCL 100 MG PO TABS
100.0000 mg | ORAL_TABLET | Freq: Every day | ORAL | Status: DC
  Administered 2021-04-26 – 2021-04-27 (×2): 100 mg via ORAL
  Filled 2021-04-25 (×2): qty 1

## 2021-04-25 MED ORDER — MECLIZINE HCL 25 MG PO TABS: 25.0000 mg | ORAL_TABLET | Freq: Three times a day (TID) | ORAL | Status: DC | PRN

## 2021-04-25 MED ORDER — LEVOTHYROXINE SODIUM 112 MCG PO TABS: 168.0000 ug | ORAL_TABLET | ORAL | Status: DC

## 2021-04-25 MED ORDER — INFLUENZA VAC A&B SA ADJ QUAD 0.5 ML IM PRSY
0.5000 mL | PREFILLED_SYRINGE | INTRAMUSCULAR | Status: DC
  Filled 2021-04-25: qty 0.5

## 2021-04-25 MED ORDER — PANTOPRAZOLE SODIUM 40 MG PO TBEC: 40.0000 mg | DELAYED_RELEASE_TABLET | Freq: Every day | ORAL | Status: DC | PRN

## 2021-04-25 MED ORDER — APIXABAN 2.5 MG PO TABS
2.5000 mg | ORAL_TABLET | Freq: Two times a day (BID) | ORAL | Status: DC
  Administered 2021-04-25 – 2021-04-27 (×4): 2.5 mg via ORAL
  Filled 2021-04-25 (×4): qty 1

## 2021-04-25 MED ORDER — ONDANSETRON HCL 4 MG/2ML IJ SOLN: 4.0000 mg | Freq: Four times a day (QID) | INTRAMUSCULAR | Status: DC | PRN

## 2021-04-25 NOTE — ED Provider Notes (Signed)
Emergency Medicine Provider Triage Evaluation Note  Heidi Adams , a 85 y.o. female  was evaluated in triage.  Pt complains of being off balance.  Patient states she went to bed at 8:00 last night feeling well.  At midnight when she woke up to the bathroom she was off balance, cannot function like she normally does.  She continues to have issues with walking today.  No fevers or chills.  No headache.  She describes it both as feeling the room spinning as well as feeling she is going to pass out.  No chest pain, shortness breath, nausea, vomiting, abdominal pain, urinary symptoms, normal bowel movements.  Review of Systems  Positive: dizziness Negative: fever  Physical Exam  BP (!) 149/80 (BP Location: Left Arm)   Pulse 83   Temp 98.6 F (37 C) (Oral)   Resp 14   Ht 5\' 4"  (1.626 m)   Wt 54.4 kg   LMP 07/25/1988 Comment: full   SpO2 100%   BMI 20.60 kg/m  Gen:   Awake, no distress   Resp:  Normal effort  MSK:   Strength and sensation intact x4 Other:  Nystagmus noted bilaterally.  CN intact.  Nose to finger without ataxia.  Mild pronator drift noted on the left side.  No weakness noted focally in any extremity however.  Patient unable to stand up or straighten up due to dizziness and feeling like she is going to fall over  Medical Decision Making  Medically screening exam initiated at 1:34 PM.  Appropriate orders placed.  TAYTEM GHATTAS was informed that the remainder of the evaluation will be completed by another provider, this initial triage assessment does not replace that evaluation, and the importance of remaining in the ED until their evaluation is complete.  Possible stroke, but does not meet LVO criteria and is outside the tpa window. Will order cva workup without calling code stroke   Franchot Heidelberg, PA-C 04/25/21 1336    Regan Lemming, MD 04/25/21 2026

## 2021-04-25 NOTE — ED Notes (Signed)
Pure wick in place will give urine sample when able too. 

## 2021-04-25 NOTE — ED Triage Notes (Signed)
Patient states she was sleeping and got up to go to the bathroom at midnight and noted that she was off balance. Patient states she went to bed around 2000 lst night.Patient also c/o not being able to pick her right foot up to walk.

## 2021-04-25 NOTE — H&P (Addendum)
History and Physical    DELANNA Adams YQM:[id number] DOB: [birth year] DOA: 04/25/2021  PCP: [name], MD  Patient coming from: Home  I have personally briefly reviewed patient's old medical records in [place]  Chief Complaint: Vertigo  HPI: Heidi Adams is a 85 y.o. female with medical history significant of HFrEF, HTN, PAF on amiodarone stopped eliquis earlier this year.  Pt in to ED with vertigo.  Onset on waking at [place] to use bathroom last night.  Persisted throughout day today.  Severe, cant walk.  Dizziness gets worse or resolves depending which way she turns her head.  No CP, SOB, N/V.  Pt has had dysuria for past 1 week, UA at home suspicious for UTI, got put on ABx, dysuria persisted.   ED Course: MRI brain neg.  UA is suspicious for UTI.   Review of Systems: As per HPI, otherwise all review of systems negative.  Past Medical History:  Diagnosis Date   Allergy    Anemia    hx of anemia   Arthritis    Breast cancer ([place])    Left breast   Cancer ([place])    skin ca nose   Chronic combined systolic and diastolic CHF (congestive heart failure) ([place]) 09/22/2018   Chronic combined systolic and diastolic CHF (congestive heart failure) ([place]) 09/22/2018   Chronic combined systolic and diastolic CHF (congestive heart failure) ([place]) 09/22/2018   Dysrhythmia    went into Atrial Fibrillation after last surgery, HAS PALPITATIONS   History of skin cancer    Hypertension    Hypothyroidism    PVC (premature ventricular contraction)    Thyroiditis 1973    Past Surgical History:  Procedure Laterality Date   ABDOMINAL HYSTERECTOMY  90   TAH BSO   APPENDECTOMY     BREAST LUMPECTOMY Left 05/30/2017   BREAST LUMPECTOMY WITH RADIOACTIVE SEED LOCALIZATION Left 05/30/2017   Procedure: LEFT BREAST LUMPECTOMY WITH RADIOACTIVE SEED LOCALIZATION;  Surgeon: Excell Seltzer, MD;  Location: [facility];  Service: General;  Laterality: Left;   CATARACT  EXTRACTION  09/10/2018   Right eye   CHOLECYSTECTOMY     dr Georgia Lopes   ELBOW SURGERY     EYE SURGERY     "on my eyelids" years ago   KNEE ARTHROSCOPY     left   THYROIDECTOMY  1973   TONSILLECTOMY     TOTAL HIP ARTHROPLASTY Right 05/10/2016   Procedure: TOTAL HIP ARTHROPLASTY ANTERIOR APPROACH;  Surgeon: Paralee Cancel, MD;  Location: [facility];  Service: Orthopedics;  Laterality: Right;   TOTAL HIP ARTHROPLASTY Left 01/10/2017   Procedure: LEFT TOTAL HIP ARTHROPLASTY ANTERIOR APPROACH;  Surgeon: Paralee Cancel, MD;  Location: [facility];  Service: Orthopedics;  Laterality: Left;   TOTAL KNEE ARTHROPLASTY Left 09/28/2015   Procedure: LEFT TOTAL KNEE ARTHROPLASTY;  Surgeon: Paralee Cancel, MD;  Location: [facility];  Service: Orthopedics;  Laterality: Left;   TOTAL KNEE ARTHROPLASTY Right 08/09/2016   Procedure: RIGHT TOTAL KNEE ARTHROPLASTY;  Surgeon: Paralee Cancel, MD;  Location: [facility];  Service: Orthopedics;  Laterality: Right;  Adductor Block   Total Left hip arthroplasty     01/10/17 Dr. Alvan Dame     reports that she quit smoking about 33 years ago. Her smoking use included cigarettes. She has a 20.00 pack-year smoking history. She has never used smokeless tobacco. She reports that she does not drink alcohol and does not use drugs.  Allergies  Allergen Reactions   Codeine Nausea Only  Epinephrine     Tachcardyia, chest pains tremors   Tenormin [Atenolol] Rash    Family History  Problem Relation Age of Onset   Diabetes Father    Hypertension Father    Stroke Father    Breast cancer Cousin    Pneumonia Sister      Prior to Admission medications   Medication Sig Start Date End Date Taking? Authorizing Provider  amiodarone (PACERONE) 200 MG tablet TAKE 1 TABLET BY MOUTH EVERY DAY Patient taking differently: Take 100 mg by mouth daily. 01/20/21  Yes Adrian Prows, MD  furosemide (LASIX) 40 MG tablet Take 1 tablet (40 mg total) by mouth every other day. Take daily if weight up by 3 Lbs or shortness of  breath Patient taking differently: Take 40 mg by mouth every 3 (three) days. 02/24/21 02/19/22 Yes Adrian Prows, MD  ibuprofen (ADVIL) 200 MG tablet Take 600 mg by mouth every 6 (six) hours as needed for mild pain or headache.   Yes [provider]  levothyroxine (SYNTHROID, LEVOTHROID) 112 MCG tablet Take 112-168 mcg by mouth daily before breakfast. Take 1 tablet (112 mcg) on Mondays, Tuesdays, Wednesdays, Thursdays, Fridays & Saturdays. Take 1.5 tablet (168 mcg) on Sundays   Yes [provider]  losartan (COZAAR) 50 MG tablet TAKE 1 TABLET BY MOUTH EACH NIGHT AT BEDTIME Patient taking differently: Take 25 mg by mouth at bedtime. 01/20/21  Yes Adrian Prows, MD  metoprolol succinate (TOPROL-XL) 50 MG 24 hr tablet Take 25 mg by mouth daily. Take with or immediately following a meal.   Yes [provider]  pantoprazole (PROTONIX) 40 MG tablet Take 40 mg by mouth daily as needed (indigestion/heartburn.).  01/20/16  Yes [provider]  potassium chloride (KLOR-CON) 10 MEQ tablet Take 2 tablets (20 mEq total) by mouth every other day. Take every time with furosemide Patient taking differently: Take 20 mEq by mouth every 3 (three) days. Taking every three days with furosemide 02/24/21  Yes Adrian Prows, MD  acetaminophen (TYLENOL) 500 MG tablet Take 1,000 mg by mouth 3 (three) times daily as needed for moderate pain.     [provider]    Physical Exam: Vitals:   04/25/21 1742 04/25/21 1743 04/25/21 1815 04/25/21 1830  BP: (!) 179/91  (!) 167/81 (!) 174/79  Pulse: 90 90 94 68  Resp: 18   15  Temp:      TempSrc:      SpO2: 95% 95% 91% 96%  Weight:      Height:        Constitutional: NAD, calm, comfortable Eyes: PERRL, lids and conjunctivae normal ENMT: Mucous membranes are moist. Posterior pharynx clear of any exudate or lesions.Normal dentition.  Neck: normal, supple, no masses, no thyromegaly Respiratory: clear to auscultation bilaterally, no wheezing, no  crackles. Normal respiratory effort. No accessory muscle use.  Cardiovascular: Regular rate and rhythm, no murmurs / rubs / gallops. No extremity edema. 2+ pedal pulses. No carotid bruits.  Abdomen: no tenderness, no masses palpated. No hepatosplenomegaly. Bowel sounds positive.  Musculoskeletal: no clubbing / cyanosis. No joint deformity upper and lower extremities. Good ROM, no contractures. Normal muscle tone.  Skin: no rashes, lesions, ulcers. No induration Neurologic: CN 2-12 grossly intact. Sensation intact, DTR normal. Strength 5/5 in all 4.  Psychiatric: Normal judgment and insight. Alert and oriented x 3. Normal mood.    Labs on Admission: I have personally reviewed following labs and imaging studies  CBC: Recent Labs  Lab 04/25/21 1404  04/25/21 1412  WBC  --  13.7*  NEUTROABS  --  11.9*  HGB 12.9 12.4  HCT 38.0 39.6  MCV  --  95.9  PLT  --  425   Basic Metabolic Panel: Recent Labs  Lab 04/25/21 1404 04/25/21 1421  NA 136 139  K 3.7 3.5  CL 104 105  CO2  --  23  GLUCOSE 101* 103*  BUN 27* 29*  CREATININE 1.10* 1.12*  CALCIUM  --  9.0   GFR: Estimated Creatinine Clearance: 32.7 mL/min (A) (by C-G formula based on SCr of 1.12 mg/dL (H)). Liver Function Tests: Recent Labs  Lab 04/25/21 1421  AST 14*  ALT 10  ALKPHOS 54  BILITOT 0.8  PROT 6.3*  ALBUMIN 3.2*   No results for input(s): LIPASE, AMYLASE in the last 168 hours. No results for input(s): AMMONIA in the last 168 hours. Coagulation Profile: Recent Labs  Lab 04/25/21 1412  INR 1.1   Cardiac Enzymes: No results for input(s): CKTOTAL, CKMB, CKMBINDEX, TROPONINI in the last 168 hours. BNP (last 3 results) No results for input(s): PROBNP in the last 8760 hours. HbA1C: No results for input(s): HGBA1C in the last 72 hours. CBG: No results for input(s): GLUCAP in the last 168 hours. Lipid Profile: No results for input(s): CHOL, HDL, LDLCALC, TRIG, CHOLHDL, LDLDIRECT in the last 72 hours. Thyroid  Function Tests: No results for input(s): TSH, T4TOTAL, FREET4, T3FREE, THYROIDAB in the last 72 hours. Anemia Panel: No results for input(s): VITAMINB12, FOLATE, FERRITIN, TIBC, IRON, RETICCTPCT in the last 72 hours. Urine analysis:    Component Value Date/Time   COLORURINE YELLOW 03/21/2020 2243   APPEARANCEUR CLEAR 03/21/2020 2243   LABSPEC 1.010 03/21/2020 2243   PHURINE 5.5 03/21/2020 2243   GLUCOSEU NEGATIVE 03/21/2020 2243   HGBUR SMALL (A) 03/21/2020 2243   BILIRUBINUR NEGATIVE 03/21/2020 2243   KETONESUR NEGATIVE 03/21/2020 2243   PROTEINUR 100 (A) 03/21/2020 2243   NITRITE NEGATIVE 03/21/2020 2243   LEUKOCYTESUR MODERATE (A) 03/21/2020 2243    Radiological Exams on Admission: CT HEAD WO CONTRAST  Result Date: 04/25/2021 CLINICAL DATA:  Unsteady gait. EXAM: CT HEAD WITHOUT CONTRAST TECHNIQUE: Contiguous axial images were obtained from the base of the skull through the vertex without intravenous contrast. COMPARISON:  CT head dated June 22, 2019. FINDINGS: Brain: No evidence of acute infarction, hemorrhage, hydrocephalus, extra-axial collection or mass lesion/mass effect. Progressive mild-to-moderate chronic microvascular ischemic changes. Unchanged 6 mm meningioma at the left frontal vertex. Vascular: Atherosclerotic vascular calcification of the carotid siphons. No hyperdense vessel. Skull: Normal. Negative for fracture or focal lesion. Sinuses/Orbits: No acute finding. Other: None. IMPRESSION: 1. No acute intracranial abnormality. 2. Progressive mild-to-moderate chronic microvascular ischemic changes. Electronically Signed   By: Titus Dubin M.D.   On: 04/25/2021 15:18   MR ANGIO HEAD WO CONTRAST  Result Date: 04/25/2021 CLINICAL DATA:  Dizziness, persistent/recurrent, cardiac or vascular cause suspected; Neuro deficit, acute, stroke suspected. EXAM: MRI HEAD WITHOUT AND WITH CONTRAST MRA HEAD WITHOUT CONTRAST MRA NECK WITHOUT AND WITH CONTRAST TECHNIQUE: Multiplanar,  multiecho pulse sequences of the brain and surrounding structures were obtained without and with intravenous contrast. Angiographic images of the Circle of Willis were obtained using MRA technique without intravenous contrast. Angiographic images of the neck were obtained using MRA technique without and with intravenous contrast. Carotid stenosis measurements (when applicable) are obtained utilizing NASCET criteria, using the distal internal carotid diameter as the denominator. CONTRAST:  67mL GADAVIST GADOBUTROL 1 MMOL/ML IV SOLN COMPARISON:  Head CT 04/25/2021. Head and neck CTA 07/30/2018. Head MRI and head MRA 06/24/2019. FINDINGS: MRI HEAD FINDINGS The study is intermittently moderately to severely motion degraded. Brain: There is no evidence of an acute infarct, intracranial hemorrhage, midline shift, or extra-axial fluid collection. T2 hyperintensities in the cerebral white matter bilaterally are unchanged and nonspecific but compatible with mild-to-moderate chronic small vessel ischemic disease. A 7 mm enhancing mass over the left frontal convexity near the vertex is unchanged from the prior MRI without mass effect on the underlying brain. Generalized cerebral atrophy is mild for age. Vascular: Major intracranial vascular flow voids are preserved. Skull and upper cervical spine: Unremarkable bone marrow signal. Sinuses/Orbits: Bilateral cataract extraction. Clear paranasal sinuses. Trace right mastoid fluid. Other: None. MRA HEAD FINDINGS Signal loss in the distal V3 and proximal V4 segments bilaterally is likely artifactual. The more distal V4 segments are widely patent to the basilar with the left being dominant. The basilar artery is widely patent. Posterior communicating arteries are diminutive or absent. Both PCAs are patent without evidence of a significant proximal stenosis. The internal carotid arteries are widely patent from skull base to carotid termini. ACAs and MCAs are patent without evidence of  a proximal branch occlusion or significant proximal stenosis. No aneurysm is identified. MRA NECK FINDINGS The study is degraded by moderate motion artifact as well as suboptimal timing on the contrast-enhanced MRA. There is a standard 3 vessel aortic arch. The common carotid and cervical internal carotid arteries are patent without evidence of a flow limiting stenosis with motion artifact limiting assessment of the proximal common carotid arteries. The vertebral arteries are patent with antegrade flow bilaterally. Assessment for vertebral stenosis is severely limited by motion and venous contamination. IMPRESSION: 1. No acute intracranial abnormality. 2. Mild-to-moderate chronic small vessel ischemic disease. 3. Unchanged 7 mm left frontal vertex meningioma. 4. Negative head MRA. 5. Motion degraded neck MRA. Patent cervical carotid and vertebral arteries. Electronically Signed   By: Logan Bores M.D.   On: 04/25/2021 18:15   MR Angiogram Neck W or Wo Contrast  Result Date: 04/25/2021 CLINICAL DATA:  Dizziness, persistent/recurrent, cardiac or vascular cause suspected; Neuro deficit, acute, stroke suspected. EXAM: MRI HEAD WITHOUT AND WITH CONTRAST MRA HEAD WITHOUT CONTRAST MRA NECK WITHOUT AND WITH CONTRAST TECHNIQUE: Multiplanar, multiecho pulse sequences of the brain and surrounding structures were obtained without and with intravenous contrast. Angiographic images of the Circle of Willis were obtained using MRA technique without intravenous contrast. Angiographic images of the neck were obtained using MRA technique without and with intravenous contrast. Carotid stenosis measurements (when applicable) are obtained utilizing NASCET criteria, using the distal internal carotid diameter as the denominator. CONTRAST:  79mL GADAVIST GADOBUTROL 1 MMOL/ML IV SOLN COMPARISON:  Head CT 04/25/2021. Head and neck CTA 07/30/2018. Head MRI and head MRA 06/24/2019. FINDINGS: MRI HEAD FINDINGS The study is intermittently  moderately to severely motion degraded. Brain: There is no evidence of an acute infarct, intracranial hemorrhage, midline shift, or extra-axial fluid collection. T2 hyperintensities in the cerebral white matter bilaterally are unchanged and nonspecific but compatible with mild-to-moderate chronic small vessel ischemic disease. A 7 mm enhancing mass over the left frontal convexity near the vertex is unchanged from the prior MRI without mass effect on the underlying brain. Generalized cerebral atrophy is mild for age. Vascular: Major intracranial vascular flow voids are preserved. Skull and upper cervical spine: Unremarkable bone marrow signal. Sinuses/Orbits: Bilateral cataract extraction. Clear paranasal sinuses. Trace right mastoid fluid. Other: None. MRA HEAD  FINDINGS Signal loss in the distal V3 and proximal V4 segments bilaterally is likely artifactual. The more distal V4 segments are widely patent to the basilar with the left being dominant. The basilar artery is widely patent. Posterior communicating arteries are diminutive or absent. Both PCAs are patent without evidence of a significant proximal stenosis. The internal carotid arteries are widely patent from skull base to carotid termini. ACAs and MCAs are patent without evidence of a proximal branch occlusion or significant proximal stenosis. No aneurysm is identified. MRA NECK FINDINGS The study is degraded by moderate motion artifact as well as suboptimal timing on the contrast-enhanced MRA. There is a standard 3 vessel aortic arch. The common carotid and cervical internal carotid arteries are patent without evidence of a flow limiting stenosis with motion artifact limiting assessment of the proximal common carotid arteries. The vertebral arteries are patent with antegrade flow bilaterally. Assessment for vertebral stenosis is severely limited by motion and venous contamination. IMPRESSION: 1. No acute intracranial abnormality. 2. Mild-to-moderate chronic  small vessel ischemic disease. 3. Unchanged 7 mm left frontal vertex meningioma. 4. Negative head MRA. 5. Motion degraded neck MRA. Patent cervical carotid and vertebral arteries. Electronically Signed   By: Logan Bores M.D.   On: 04/25/2021 18:15   MR BRAIN W WO CONTRAST  Result Date: 04/25/2021 CLINICAL DATA:  Dizziness, persistent/recurrent, cardiac or vascular cause suspected; Neuro deficit, acute, stroke suspected. EXAM: MRI HEAD WITHOUT AND WITH CONTRAST MRA HEAD WITHOUT CONTRAST MRA NECK WITHOUT AND WITH CONTRAST TECHNIQUE: Multiplanar, multiecho pulse sequences of the brain and surrounding structures were obtained without and with intravenous contrast. Angiographic images of the Circle of Willis were obtained using MRA technique without intravenous contrast. Angiographic images of the neck were obtained using MRA technique without and with intravenous contrast. Carotid stenosis measurements (when applicable) are obtained utilizing NASCET criteria, using the distal internal carotid diameter as the denominator. CONTRAST:  63mL GADAVIST GADOBUTROL 1 MMOL/ML IV SOLN COMPARISON:  Head CT 04/25/2021. Head and neck CTA 07/30/2018. Head MRI and head MRA 06/24/2019. FINDINGS: MRI HEAD FINDINGS The study is intermittently moderately to severely motion degraded. Brain: There is no evidence of an acute infarct, intracranial hemorrhage, midline shift, or extra-axial fluid collection. T2 hyperintensities in the cerebral white matter bilaterally are unchanged and nonspecific but compatible with mild-to-moderate chronic small vessel ischemic disease. A 7 mm enhancing mass over the left frontal convexity near the vertex is unchanged from the prior MRI without mass effect on the underlying brain. Generalized cerebral atrophy is mild for age. Vascular: Major intracranial vascular flow voids are preserved. Skull and upper cervical spine: Unremarkable bone marrow signal. Sinuses/Orbits: Bilateral cataract extraction. Clear  paranasal sinuses. Trace right mastoid fluid. Other: None. MRA HEAD FINDINGS Signal loss in the distal V3 and proximal V4 segments bilaterally is likely artifactual. The more distal V4 segments are widely patent to the basilar with the left being dominant. The basilar artery is widely patent. Posterior communicating arteries are diminutive or absent. Both PCAs are patent without evidence of a significant proximal stenosis. The internal carotid arteries are widely patent from skull base to carotid termini. ACAs and MCAs are patent without evidence of a proximal branch occlusion or significant proximal stenosis. No aneurysm is identified. MRA NECK FINDINGS The study is degraded by moderate motion artifact as well as suboptimal timing on the contrast-enhanced MRA. There is a standard 3 vessel aortic arch. The common carotid and cervical internal carotid arteries are patent without evidence of a flow limiting  stenosis with motion artifact limiting assessment of the proximal common carotid arteries. The vertebral arteries are patent with antegrade flow bilaterally. Assessment for vertebral stenosis is severely limited by motion and venous contamination. IMPRESSION: 1. No acute intracranial abnormality. 2. Mild-to-moderate chronic small vessel ischemic disease. 3. Unchanged 7 mm left frontal vertex meningioma. 4. Negative head MRA. 5. Motion degraded neck MRA. Patent cervical carotid and vertebral arteries. Electronically Signed   By: Logan Bores M.D.   On: 04/25/2021 18:15    EKG: Independently reviewed.  Assessment/Plan Principal Problem:   Vertigo Active Problems:   Chronic systolic heart failure (HCC)   A-fib (HCC)   Acute lower UTI    Vertigo - Meclizine PT vestibular eval Chronic HFrEF - Cont Q72H lasix HTN - Cont low dose losartan and BB Apparently unable to tolerate anything more otherwise causes dizziness (see also Cards office note). A.Fib - Cont amiodarone At patient request: will resume  Eliquis, will send message to Dr. [name] UTI - Dysuria + WBC in UA with small LE UCx pending Given ongoing symptoms will treat for UTI with rocephin empirically.  DVT prophylaxis: Eliquis Code Status: DNR Family Communication: No family in room Disposition Plan: TBD, pending PT eval Consults called: None Admission status: Place in 15    Andres Vest, [place]  How to contact the [facility] Attending or Consulting provider Seth Ward or covering provider during after hours [place], for this patient?  Check the care team in [facility] and look for a) attending/consulting [facility] provider listed and b) the [facility] team listed Log into [email or web address]  Amion Physician Scheduling and messaging for groups and whole hospitals  On call and physician scheduling software for group practices, residents, hospitalists and other medical providers for call, clinic, rotation and shift schedules. OnCall [employer] is a hospital-wide system for scheduling doctors and paging doctors on call. EasyPlot is for scientific plotting and data analysis.  [email or web address]  and use [facility]'s universal password to access. If you do not have the password, please contact the hospital operator.  Locate the [facility] provider you are looking for under [facility] and page to a number that you can be directly reached. If you still have difficulty reaching the provider, please page the [facility] (Director on Call) for the Hospitalists listed on amion for assistance.  04/25/2021, 7:17 PM

## 2021-04-25 NOTE — Discharge Instructions (Addendum)
Recommend follow-up in clinic with neurology for continued evaluation and management.  We have ruled out stroke on MRI imaging today.  Your arterial blood supply to your brain also appears to be patent on MRA imaging.  ________________________________________________________________________________  Information on my medicine - ELIQUIS (apixaban)  This medication education was reviewed with me or my healthcare representative as part of my discharge preparation.    Why was Eliquis prescribed for you? Eliquis was prescribed for you to reduce the risk of a blood clot forming that can cause a stroke if you have a medical condition called atrial fibrillation (a type of irregular heartbeat).  What do You need to know about Eliquis ? Take your Eliquis TWICE DAILY - one tablet in the morning and one tablet in the evening with or without food. If you have difficulty swallowing the tablet whole please discuss with your pharmacist how to take the medication safely.  Take Eliquis exactly as prescribed by your doctor and DO NOT stop taking Eliquis without talking to the doctor who prescribed the medication.  Stopping may increase your risk of developing a stroke.  Refill your prescription before you run out.  After discharge, you should have regular check-up appointments with your healthcare provider that is prescribing your Eliquis.  In the future your dose may need to be changed if your kidney function or weight changes by a significant amount or as you get older.  What do you do if you miss a dose? If you miss a dose, take it as soon as you remember on the same day and resume taking twice daily.  Do not take more than one dose of ELIQUIS at the same time to make up a missed dose.  Important Safety Information A possible side effect of Eliquis is bleeding. You should call your healthcare provider right away if you experience any of the following: Bleeding from an injury or your nose that does  not stop. Unusual colored urine (red or dark brown) or unusual colored stools (red or black). Unusual bruising for unknown reasons. A serious fall or if you hit your head (even if there is no bleeding).  Some medicines may interact with Eliquis and might increase your risk of bleeding or clotting while on Eliquis. To help avoid this, consult your healthcare provider or pharmacist prior to using any new prescription or non-prescription medications, including herbals, vitamins, non-steroidal anti-inflammatory drugs (NSAIDs) and supplements.  This website has more information on Eliquis (apixaban): http://[email or web address].com/eliquis/home

## 2021-04-25 NOTE — Progress Notes (Signed)
ANTICOAGULATION CONSULT NOTE   Pharmacy Consult for Apixaban Indication: atrial fibrillation  Allergies  Allergen Reactions   Codeine Nausea Only   Epinephrine     Tachcardyia, chest pains tremors   Tenormin [Atenolol] Rash    Patient Measurements: Height: 5\' 4"  (162.6 cm) Weight: 54.4 kg (120 lb) IBW/kg (Calculated) : 54.7  Vital Signs: Temp: 98.2 F (36.8 C) (10/02 2045) Temp Source: Oral (10/02 2045) BP: 137/59 (10/02 2045) Pulse Rate: 75 (10/02 2045)  Labs: Recent Labs    04/25/21 1404 04/25/21 1412 04/25/21 1421  HGB 12.9 12.4  --   HCT 38.0 39.6  --   PLT  --  190  --   APTT  --  23*  --   LABPROT  --  14.4  --   INR  --  1.1  --   CREATININE 1.10*  --  1.12*    Estimated Creatinine Clearance: 32.7 mL/min (A) (by C-G formula based on SCr of 1.12 mg/dL (H)).   Medical History: Past Medical History:  Diagnosis Date   Allergy    Anemia    hx of anemia   Arthritis    Breast cancer ([place])    Left breast   Cancer ([place])    skin ca nose   Chronic combined systolic and diastolic CHF (congestive heart failure) ([facility]) 09/22/2018   Chronic combined systolic and diastolic CHF (congestive heart failure) ([place]) 09/22/2018   Chronic combined systolic and diastolic CHF (congestive heart failure) ([place]) 09/22/2018   Dysrhythmia    went into Atrial Fibrillation after last surgery, HAS PALPITATIONS   History of skin cancer    Hypertension    Hypothyroidism    PVC (premature ventricular contraction)    Thyroiditis 1973    Medications:  Scheduled:   [START ON 04/26/2021] amiodarone  100 mg Oral Daily   apixaban  2.5 mg Oral BID   [START ON 04/26/2021] furosemide  40 mg Oral Q72H   [START ON 04/26/2021] levothyroxine  112 mcg Oral Once per day on Mon Tue Wed Thu Fri Sat   And   [START ON 05/02/2021] levothyroxine  168 mcg Oral Once per day on Sun   losartan  25 mg Oral QHS   [START ON 04/26/2021] metoprolol succinate  25 mg Oral Daily   [START ON 04/26/2021] potassium  chloride  20 mEq Oral Q72H    Assessment: 85 yoF to ED with dizziness. Hx Afib, on Amiodarone, had stopped Eliquis earlier this year, time unknown. TBW < 60 kg  Goal of Therapy:   Monitor platelets by anticoagulation protocol: Yes   Plan:  Resume Apixaban 2.5mg  bid  Monitor for s/s bleed   Minda Ditto PharmD 04/25/2021,9:01 PM

## 2021-04-25 NOTE — ED Provider Notes (Signed)
[facility] DEPT Provider Note   CSN: [id number] Arrival date & time: 04/25/21  1301     History Chief Complaint  Patient presents with   off balance   Weakness    Heidi Adams is a 85 y.o. female.   Weakness Associated symptoms: dizziness   Associated symptoms: no abdominal pain, no arthralgias, no chest pain, no cough, no dysuria, no fever, no headaches, no seizures, no shortness of breath and no vomiting    The patient is an 85 year old female with medical history below to include CHF, follows with cardiology presenting to the emergency department with acute onset room spinning dizziness that began around midnight last night.  The patient states that symptoms came on suddenly around midnight and have resulted in difficulty ambulating.  She feels off balance.  She went to bed around 8:00 last night feeling well and woke up around midnight to go to the bathroom and felt like she could not ambulate to the bathroom due to room spinning dizziness.  She continues to endorse symptoms.  She has had issues with balance and walking today.  She feels lightheaded with additional room spinning.  She denies any numbness, weakness, facial droop, difficulty speaking or difficulty swallowing.  She denies any fevers, chills, headaches, vision changes or neck stiffness.  Past Medical History:  Diagnosis Date   Allergy    Anemia    hx of anemia   Arthritis    Breast cancer ([place])    Left breast   Cancer ([place])    skin ca nose   Chronic combined systolic and diastolic CHF (congestive heart failure) ([place]) 09/22/2018   Chronic combined systolic and diastolic CHF (congestive heart failure) ([employer]) 09/22/2018   Chronic combined systolic and diastolic CHF (congestive heart failure) ([place]) 09/22/2018   Dysrhythmia    went into Atrial Fibrillation after last surgery, HAS PALPITATIONS   History of skin cancer    Hypertension    Hypothyroidism    PVC (premature ventricular  contraction)    Thyroiditis 1973    Patient Active Problem List   Diagnosis Date Noted   Vertigo 04/25/2021   Acute lower UTI 04/25/2021   Nonischemic cardiomyopathy ([place]) 02/24/2021   Orthostatic hypotension 19/50/9326   Acute systolic CHF (congestive heart failure) ([place]) 02/06/2021   Acute CHF (congestive heart failure) ([place]) 02/06/2021   Family history of colonic polyps 10/12/2020   Gastroesophageal reflux disease with esophagitis and hemorrhage 10/12/2020   Hemorrhage of rectum and anus 10/12/2020   Slow transit constipation 10/12/2020   Iron deficiency anemia due to chronic blood loss 06/24/2020   E coli bacteremia 03/23/2020   A-fib ([place]) 03/23/2020   Acute cystitis 03/22/2020   Atrial fibrillation with RVR ([place]) 03/22/2020   History of congestive heart failure 03/22/2020   Elevated troponin    Swelling of right foot 10/25/2019   Dizziness 06/22/2019   Arthritis of wrist 71/24/5809   Chronic systolic heart failure ([place]) 09/22/2018   DDD (degenerative disc disease), cervical 08/20/2018   Left elbow pain 08/02/2018   Neck pain 08/02/2018   TIA (transient ischemic attack) 07/30/2018   Hypertensive urgency 07/30/2018   AF (paroxysmal atrial fibrillation) ([place]) 07/30/2018   Arthritis of carpometacarpal ([place]) joint of left thumb 06/20/2018   Pain in thumb joint with movement of left hand 05/08/2018   Pain in finger of right hand 01/02/2018   Pain in left knee 11/08/2017   Conductive hearing loss, bilateral 10/19/2017   Dry nose 10/19/2017   Chronic  hip pain after total replacement of left hip joint 08/25/2017   Trochanteric bursitis of left hip 08/25/2017   Localized, primary osteoarthritis of elbow 08/22/2017   Synovitis and tenosynovitis 08/11/2017   Malignant neoplasm of upper-inner quadrant of left breast in female, estrogen receptor positive ([place]) 06/20/2017   S/P closed reduction of dislocated total hip prosthesis 02/03/2017   S/P left THA, AA 01/10/2017   Fatigue  05/31/2016   Overweight (BMI 25.0-29.9) 05/11/2016   S/P right THA, AA 05/10/2016   Right hip pain 03/16/2016   Pes planus 03/16/2016   Abnormality of gait 03/16/2016   PAC (premature atrial contraction) 02/24/2016   PVC (premature ventricular contraction) 02/24/2016   Palpitations 01/27/2016   Benign paroxysmal positional vertigo of right ear 12/15/2015   Pharyngoesophageal dysphagia 12/15/2015   Hypothyroidism 11/26/2015   Supine hypertension 11/26/2015   S/P left TKA 03/50/0938   Lichen sclerosus 18/29/9371    Past Surgical History:  Procedure Laterality Date   ABDOMINAL HYSTERECTOMY  90   TAH BSO   APPENDECTOMY     BREAST LUMPECTOMY Left 05/30/2017   BREAST LUMPECTOMY WITH RADIOACTIVE SEED LOCALIZATION Left 05/30/2017   Procedure: LEFT BREAST LUMPECTOMY WITH RADIOACTIVE SEED LOCALIZATION;  Surgeon: Excell Seltzer, MD;  Location: [facility];  Service: General;  Laterality: Left;   CATARACT EXTRACTION  09/10/2018   Right eye   CHOLECYSTECTOMY     dr Georgia Lopes   ELBOW SURGERY     EYE SURGERY     "on my eyelids" years ago   KNEE ARTHROSCOPY     left   THYROIDECTOMY  1973   TONSILLECTOMY     TOTAL HIP ARTHROPLASTY Right 05/10/2016   Procedure: TOTAL HIP ARTHROPLASTY ANTERIOR APPROACH;  Surgeon: Paralee Cancel, MD;  Location: [facility];  Service: Orthopedics;  Laterality: Right;   TOTAL HIP ARTHROPLASTY Left 01/10/2017   Procedure: LEFT TOTAL HIP ARTHROPLASTY ANTERIOR APPROACH;  Surgeon: Paralee Cancel, MD;  Location: [facility];  Service: Orthopedics;  Laterality: Left;   TOTAL KNEE ARTHROPLASTY Left 09/28/2015   Procedure: LEFT TOTAL KNEE ARTHROPLASTY;  Surgeon: Paralee Cancel, MD;  Location: [facility];  Service: Orthopedics;  Laterality: Left;   TOTAL KNEE ARTHROPLASTY Right 08/09/2016   Procedure: RIGHT TOTAL KNEE ARTHROPLASTY;  Surgeon: Paralee Cancel, MD;  Location: [facility];  Service: Orthopedics;  Laterality: Right;  Adductor Block   Total Left hip arthroplasty     01/10/17  Dr. Alvan Dame     OB History     Gravida  2   Para  2   Term      Preterm      AB      Living  2      SAB      IAB      Ectopic      Multiple      Live Births              Family History  Problem Relation Age of Onset   Diabetes Father    Hypertension Father    Stroke Father    Breast cancer Cousin    Pneumonia Sister     Social History   Tobacco Use   Smoking status: Former    Packs/day: 1.00    Years: 20.00    Pack years: 20.00    Types: Cigarettes    Quit date: 07/26/1987    Years since quitting: 33.7   Smokeless tobacco: Never  Vaping Use   Vaping Use: Never used  Substance Use  Topics   Alcohol use: No    Alcohol/week: 0.0 standard drinks   Drug use: No    Home Medications Prior to Admission medications   Medication Sig Start Date End Date Taking? Authorizing Provider  amiodarone (PACERONE) 200 MG tablet TAKE 1 TABLET BY MOUTH EVERY DAY Patient taking differently: Take 100 mg by mouth daily. 01/20/21  Yes Adrian Prows, MD  furosemide (LASIX) 40 MG tablet Take 1 tablet (40 mg total) by mouth every other day. Take daily if weight up by 3 Lbs or shortness of breath Patient taking differently: Take 40 mg by mouth every 3 (three) days. 02/24/21 02/19/22 Yes Adrian Prows, MD  ibuprofen (ADVIL) 200 MG tablet Take 600 mg by mouth every 6 (six) hours as needed for mild pain or headache.   Yes [provider]  levothyroxine (SYNTHROID, LEVOTHROID) 112 MCG tablet Take 112-168 mcg by mouth daily before breakfast. Take 1 tablet (112 mcg) on Mondays, Tuesdays, Wednesdays, Thursdays, Fridays & Saturdays. Take 1.5 tablet (168 mcg) on Sundays   Yes [provider]  losartan (COZAAR) 50 MG tablet TAKE 1 TABLET BY MOUTH EACH NIGHT AT BEDTIME Patient taking differently: Take 25 mg by mouth at bedtime. 01/20/21  Yes Adrian Prows, MD  metoprolol succinate (TOPROL-XL) 50 MG 24 hr tablet Take 25 mg by mouth daily. Take with or immediately following a meal.   Yes  [provider]  pantoprazole (PROTONIX) 40 MG tablet Take 40 mg by mouth daily as needed (indigestion/heartburn.).  01/20/16  Yes [provider]  potassium chloride (KLOR-CON) 10 MEQ tablet Take 2 tablets (20 mEq total) by mouth every other day. Take every time with furosemide Patient taking differently: Take 20 mEq by mouth every 3 (three) days. Taking every three days with furosemide 02/24/21  Yes Adrian Prows, MD  acetaminophen (TYLENOL) 500 MG tablet Take 1,000 mg by mouth 3 (three) times daily as needed for moderate pain.     [provider]    Allergies    Codeine, Epinephrine, and Tenormin [atenolol]  Review of Systems   Review of Systems  Constitutional:  Negative for chills and fever.  HENT:  Negative for ear pain and sore throat.   Eyes:  Negative for pain and visual disturbance.  Respiratory:  Negative for cough and shortness of breath.   Cardiovascular:  Negative for chest pain and palpitations.  Gastrointestinal:  Negative for abdominal pain and vomiting.  Genitourinary:  Negative for dysuria and hematuria.  Musculoskeletal:  Negative for arthralgias and back pain.  Skin:  Negative for color change and rash.  Neurological:  Positive for dizziness, weakness and light-headedness. Negative for seizures, syncope, facial asymmetry, speech difficulty, numbness and headaches.  All other systems reviewed and are negative.  Physical Exam Updated Vital Signs BP (!) 157/75   Pulse 63   Temp 97.9 F (36.6 C) (Axillary)   Resp 16   Ht 5\' 4"  (1.626 m)   Wt 54.4 kg   LMP 07/25/1988 Comment: full   SpO2 96%   BMI 20.60 kg/m   Physical Exam Vitals and nursing note reviewed.  Constitutional:      General: She is not in acute distress.    Appearance: She is well-developed.  HENT:     Head: Normocephalic and atraumatic.  Eyes:     Conjunctiva/sclera: Conjunctivae normal.  Cardiovascular:     Rate and Rhythm: Normal rate and regular rhythm.     Heart  sounds: No murmur heard. Pulmonary:  Effort: Pulmonary effort is normal. No respiratory distress.     Breath sounds: Normal breath sounds.  Abdominal:     Palpations: Abdomen is soft.     Tenderness: There is no abdominal tenderness.  Musculoskeletal:     Cervical back: Neck supple.  Skin:    General: Skin is warm and dry.  Neurological:     Mental Status: She is alert.     Comments: MENTAL STATUS EXAM:    Orientation: Alert and oriented to person, place and time.  Memory: Cooperative, follows commands well.  Language: Speech is clear and language is normal.   CRANIAL NERVES:    CN 2 (Optic): Visual fields intact to confrontation.  CN 3,4,6 (EOM): Pupils equal and reactive to light. Full extraocular eye movement without nystagmus.  CN 5 (Trigeminal): Facial sensation is normal, no weakness of masticatory muscles.  CN 7 (Facial): No facial weakness or asymmetry.  CN 8 (Auditory): Auditory acuity grossly normal.  CN 9,10 (Glossophar): The uvula is midline, the palate elevates symmetrically.  CN 11 (spinal access): Normal sternocleidomastoid and trapezius strength.  CN 12 (Hypoglossal): The tongue is midline. No atrophy or fasciculations.Marland Kitchen   MOTOR:  Muscle Strength: 5/5RUE, 5/5LUE, 5/5RLE, 5/5LLE  REFLEXES: No clonus.   COORDINATION:   Finger to nose with dysmetria in the LUE, mild pronator drift present  SENSATION:   Intact to light touch all four extremities.  GAIT: Gait not assessed     ED Results / Procedures / Treatments   Labs (all labs ordered are listed, but only abnormal results are displayed) Labs Reviewed  APTT - Abnormal; Notable for the following components:      Result Value   aPTT 23 (*)    All other components within normal limits  CBC - Abnormal; Notable for the following components:   WBC 13.7 (*)    All other components within normal limits  DIFFERENTIAL - Abnormal; Notable for the following components:   Neutro Abs 11.9 (*)    All other  components within normal limits  COMPREHENSIVE METABOLIC PANEL - Abnormal; Notable for the following components:   Glucose, Bld 103 (*)    BUN 29 (*)    Creatinine, Ser 1.12 (*)    Total Protein 6.3 (*)    Albumin 3.2 (*)    AST 14 (*)    GFR, Estimated 49 (*)    All other components within normal limits  URINALYSIS, ROUTINE W REFLEX MICROSCOPIC - Abnormal; Notable for the following components:   Color, Urine YELLOW (*)    APPearance HAZY (*)    Hgb urine dipstick TRACE (*)    Protein, ur 100 (*)    Leukocytes,Ua SMALL (*)    WBC, UA >50 (*)    Bacteria, UA RARE (*)    All other components within normal limits  CBC - Abnormal; Notable for the following components:   RBC 3.39 (*)    Hemoglobin 10.3 (*)    HCT 32.0 (*)    Platelets 140 (*)    All other components within normal limits  I-STAT CHEM 8, ED - Abnormal; Notable for the following components:   BUN 27 (*)    Creatinine, Ser 1.10 (*)    Glucose, Bld 101 (*)    Calcium, Ion 1.12 (*)    All other components within normal limits  RESP PANEL BY RT-PCR (FLU A&B, COVID) ARPGX2  URINE CULTURE  ETHANOL  PROTIME-INR  RAPID URINE DRUG SCREEN, HOSP PERFORMED    EKG EKG  Interpretation  Date/Time:  Sunday April 25 2021 14:27:24 EDT Ventricular Rate:  81 PR Interval:  201 QRS Duration: 105 QT Interval:  384 QTC Calculation: 446 R Axis:   -59 Text Interpretation: Sinus rhythm Atrial premature complexes Incomplete RBBB and LAFB Abnormal R-wave progression, late transition LVH with secondary repolarization abnormality Confirmed by Regan Lemming ([id number]) on 04/25/2021 5:38:32 PM  Radiology CT HEAD WO CONTRAST  Result Date: 04/25/2021 CLINICAL DATA:  Unsteady gait. EXAM: CT HEAD WITHOUT CONTRAST TECHNIQUE: Contiguous axial images were obtained from the base of the skull through the vertex without intravenous contrast. COMPARISON:  CT head dated June 22, 2019. FINDINGS: Brain: No evidence of acute infarction, hemorrhage,  hydrocephalus, extra-axial collection or mass lesion/mass effect. Progressive mild-to-moderate chronic microvascular ischemic changes. Unchanged 6 mm meningioma at the left frontal vertex. Vascular: Atherosclerotic vascular calcification of the carotid siphons. No hyperdense vessel. Skull: Normal. Negative for fracture or focal lesion. Sinuses/Orbits: No acute finding. Other: None. IMPRESSION: 1. No acute intracranial abnormality. 2. Progressive mild-to-moderate chronic microvascular ischemic changes. Electronically Signed   By: Titus Dubin M.D.   On: 04/25/2021 15:18   MR ANGIO HEAD WO CONTRAST  Result Date: 04/25/2021 CLINICAL DATA:  Dizziness, persistent/recurrent, cardiac or vascular cause suspected; Neuro deficit, acute, stroke suspected. EXAM: MRI HEAD WITHOUT AND WITH CONTRAST MRA HEAD WITHOUT CONTRAST MRA NECK WITHOUT AND WITH CONTRAST TECHNIQUE: Multiplanar, multiecho pulse sequences of the brain and surrounding structures were obtained without and with intravenous contrast. Angiographic images of the Circle of Willis were obtained using MRA technique without intravenous contrast. Angiographic images of the neck were obtained using MRA technique without and with intravenous contrast. Carotid stenosis measurements (when applicable) are obtained utilizing NASCET criteria, using the distal internal carotid diameter as the denominator. CONTRAST:  5mL GADAVIST GADOBUTROL 1 MMOL/ML IV SOLN COMPARISON:  Head CT 04/25/2021. Head and neck CTA 07/30/2018. Head MRI and head MRA 06/24/2019. FINDINGS: MRI HEAD FINDINGS The study is intermittently moderately to severely motion degraded. Brain: There is no evidence of an acute infarct, intracranial hemorrhage, midline shift, or extra-axial fluid collection. T2 hyperintensities in the cerebral white matter bilaterally are unchanged and nonspecific but compatible with mild-to-moderate chronic small vessel ischemic disease. A 7 mm enhancing mass over the left frontal  convexity near the vertex is unchanged from the prior MRI without mass effect on the underlying brain. Generalized cerebral atrophy is mild for age. Vascular: Major intracranial vascular flow voids are preserved. Skull and upper cervical spine: Unremarkable bone marrow signal. Sinuses/Orbits: Bilateral cataract extraction. Clear paranasal sinuses. Trace right mastoid fluid. Other: None. MRA HEAD FINDINGS Signal loss in the distal V3 and proximal V4 segments bilaterally is likely artifactual. The more distal V4 segments are widely patent to the basilar with the left being dominant. The basilar artery is widely patent. Posterior communicating arteries are diminutive or absent. Both PCAs are patent without evidence of a significant proximal stenosis. The internal carotid arteries are widely patent from skull base to carotid termini. ACAs and MCAs are patent without evidence of a proximal branch occlusion or significant proximal stenosis. No aneurysm is identified. MRA NECK FINDINGS The study is degraded by moderate motion artifact as well as suboptimal timing on the contrast-enhanced MRA. There is a standard 3 vessel aortic arch. The common carotid and cervical internal carotid arteries are patent without evidence of a flow limiting stenosis with motion artifact limiting assessment of the proximal common carotid arteries. The vertebral arteries are patent with antegrade flow bilaterally. Assessment for vertebral  stenosis is severely limited by motion and venous contamination. IMPRESSION: 1. No acute intracranial abnormality. 2. Mild-to-moderate chronic small vessel ischemic disease. 3. Unchanged 7 mm left frontal vertex meningioma. 4. Negative head MRA. 5. Motion degraded neck MRA. Patent cervical carotid and vertebral arteries. Electronically Signed   By: Logan Bores M.D.   On: 04/25/2021 18:15   MR Angiogram Neck W or Wo Contrast  Result Date: 04/25/2021 CLINICAL DATA:  Dizziness, persistent/recurrent, cardiac  or vascular cause suspected; Neuro deficit, acute, stroke suspected. EXAM: MRI HEAD WITHOUT AND WITH CONTRAST MRA HEAD WITHOUT CONTRAST MRA NECK WITHOUT AND WITH CONTRAST TECHNIQUE: Multiplanar, multiecho pulse sequences of the brain and surrounding structures were obtained without and with intravenous contrast. Angiographic images of the Circle of Willis were obtained using MRA technique without intravenous contrast. Angiographic images of the neck were obtained using MRA technique without and with intravenous contrast. Carotid stenosis measurements (when applicable) are obtained utilizing NASCET criteria, using the distal internal carotid diameter as the denominator. CONTRAST:  43mL GADAVIST GADOBUTROL 1 MMOL/ML IV SOLN COMPARISON:  Head CT 04/25/2021. Head and neck CTA 07/30/2018. Head MRI and head MRA 06/24/2019. FINDINGS: MRI HEAD FINDINGS The study is intermittently moderately to severely motion degraded. Brain: There is no evidence of an acute infarct, intracranial hemorrhage, midline shift, or extra-axial fluid collection. T2 hyperintensities in the cerebral white matter bilaterally are unchanged and nonspecific but compatible with mild-to-moderate chronic small vessel ischemic disease. A 7 mm enhancing mass over the left frontal convexity near the vertex is unchanged from the prior MRI without mass effect on the underlying brain. Generalized cerebral atrophy is mild for age. Vascular: Major intracranial vascular flow voids are preserved. Skull and upper cervical spine: Unremarkable bone marrow signal. Sinuses/Orbits: Bilateral cataract extraction. Clear paranasal sinuses. Trace right mastoid fluid. Other: None. MRA HEAD FINDINGS Signal loss in the distal V3 and proximal V4 segments bilaterally is likely artifactual. The more distal V4 segments are widely patent to the basilar with the left being dominant. The basilar artery is widely patent. Posterior communicating arteries are diminutive or absent. Both  PCAs are patent without evidence of a significant proximal stenosis. The internal carotid arteries are widely patent from skull base to carotid termini. ACAs and MCAs are patent without evidence of a proximal branch occlusion or significant proximal stenosis. No aneurysm is identified. MRA NECK FINDINGS The study is degraded by moderate motion artifact as well as suboptimal timing on the contrast-enhanced MRA. There is a standard 3 vessel aortic arch. The common carotid and cervical internal carotid arteries are patent without evidence of a flow limiting stenosis with motion artifact limiting assessment of the proximal common carotid arteries. The vertebral arteries are patent with antegrade flow bilaterally. Assessment for vertebral stenosis is severely limited by motion and venous contamination. IMPRESSION: 1. No acute intracranial abnormality. 2. Mild-to-moderate chronic small vessel ischemic disease. 3. Unchanged 7 mm left frontal vertex meningioma. 4. Negative head MRA. 5. Motion degraded neck MRA. Patent cervical carotid and vertebral arteries. Electronically Signed   By: Logan Bores M.D.   On: 04/25/2021 18:15   MR BRAIN W WO CONTRAST  Result Date: 04/25/2021 CLINICAL DATA:  Dizziness, persistent/recurrent, cardiac or vascular cause suspected; Neuro deficit, acute, stroke suspected. EXAM: MRI HEAD WITHOUT AND WITH CONTRAST MRA HEAD WITHOUT CONTRAST MRA NECK WITHOUT AND WITH CONTRAST TECHNIQUE: Multiplanar, multiecho pulse sequences of the brain and surrounding structures were obtained without and with intravenous contrast. Angiographic images of the Circle of Willis were obtained using  MRA technique without intravenous contrast. Angiographic images of the neck were obtained using MRA technique without and with intravenous contrast. Carotid stenosis measurements (when applicable) are obtained utilizing NASCET criteria, using the distal internal carotid diameter as the denominator. CONTRAST:  31mL GADAVIST  GADOBUTROL 1 MMOL/ML IV SOLN COMPARISON:  Head CT 04/25/2021. Head and neck CTA 07/30/2018. Head MRI and head MRA 06/24/2019. FINDINGS: MRI HEAD FINDINGS The study is intermittently moderately to severely motion degraded. Brain: There is no evidence of an acute infarct, intracranial hemorrhage, midline shift, or extra-axial fluid collection. T2 hyperintensities in the cerebral white matter bilaterally are unchanged and nonspecific but compatible with mild-to-moderate chronic small vessel ischemic disease. A 7 mm enhancing mass over the left frontal convexity near the vertex is unchanged from the prior MRI without mass effect on the underlying brain. Generalized cerebral atrophy is mild for age. Vascular: Major intracranial vascular flow voids are preserved. Skull and upper cervical spine: Unremarkable bone marrow signal. Sinuses/Orbits: Bilateral cataract extraction. Clear paranasal sinuses. Trace right mastoid fluid. Other: None. MRA HEAD FINDINGS Signal loss in the distal V3 and proximal V4 segments bilaterally is likely artifactual. The more distal V4 segments are widely patent to the basilar with the left being dominant. The basilar artery is widely patent. Posterior communicating arteries are diminutive or absent. Both PCAs are patent without evidence of a significant proximal stenosis. The internal carotid arteries are widely patent from skull base to carotid termini. ACAs and MCAs are patent without evidence of a proximal branch occlusion or significant proximal stenosis. No aneurysm is identified. MRA NECK FINDINGS The study is degraded by moderate motion artifact as well as suboptimal timing on the contrast-enhanced MRA. There is a standard 3 vessel aortic arch. The common carotid and cervical internal carotid arteries are patent without evidence of a flow limiting stenosis with motion artifact limiting assessment of the proximal common carotid arteries. The vertebral arteries are patent with antegrade flow  bilaterally. Assessment for vertebral stenosis is severely limited by motion and venous contamination. IMPRESSION: 1. No acute intracranial abnormality. 2. Mild-to-moderate chronic small vessel ischemic disease. 3. Unchanged 7 mm left frontal vertex meningioma. 4. Negative head MRA. 5. Motion degraded neck MRA. Patent cervical carotid and vertebral arteries. Electronically Signed   By: Logan Bores M.D.   On: 04/25/2021 18:15    Procedures Procedures   Medications Ordered in ED Medications  furosemide (LASIX) tablet 40 mg (40 mg Oral Given 04/26/21 0821)  amiodarone (PACERONE) tablet 100 mg (100 mg Oral Given 04/26/21 0822)  metoprolol succinate (TOPROL-XL) 24 hr tablet 25 mg (25 mg Oral Given 04/26/21 0822)  losartan (COZAAR) tablet 25 mg (25 mg Oral Given 04/25/21 2131)  pantoprazole (PROTONIX) EC tablet 40 mg (has no administration in time range)  potassium chloride SA (KLOR-CON) CR tablet 20 mEq (20 mEq Oral Given 04/26/21 0821)  acetaminophen (TYLENOL) tablet 1,000 mg (1,000 mg Oral Given 04/26/21 0921)  ondansetron (ZOFRAN) tablet 4 mg (has no administration in time range)    Or  ondansetron (ZOFRAN) injection 4 mg (has no administration in time range)  meclizine (ANTIVERT) tablet 25 mg (has no administration in time range)  levothyroxine (SYNTHROID) tablet 112 mcg (112 mcg Oral Given 04/26/21 0511)    And  levothyroxine (SYNTHROID) tablet 168 mcg (has no administration in time range)  cefTRIAXone (ROCEPHIN) 1 g in sodium chloride 0.9 % 100 mL IVPB (1 g Intravenous New Bag/Given 04/25/21 2131)  apixaban (ELIQUIS) tablet 2.5 mg (2.5 mg Oral Given 04/26/21 1287)  influenza vaccine adjuvanted (FLUAD) injection 0.5 mL (0.5 mLs Intramuscular Not Given 04/26/21 0807)  gadobutrol (GADAVIST) 1 MMOL/ML injection 5 mL (5 mLs Intravenous Contrast Given 04/25/21 1734)    ED Course  I have reviewed the triage vital signs and the nursing notes.  Pertinent labs & imaging results that were available during  my care of the patient were reviewed by me and considered in my medical decision making (see chart for details).    MDM Rules/Calculators/A&P                           85 year old female presenting to the emergency department with concern for vertigo.  Patient developed sudden onset vertigo on walking to use the restroom last night around midnight.  She has had persistent vertigo since then and has had difficulty ambulating.  Differential includes peripheral vertigo versus central etiology.  We will obtain MRI to evaluate further for possible stroke.  MRI performed resulted negative for central etiology of the patient's vertigo.  No evidence of stroke or vertebral/arterial insufficiency.  Laboratory work-up with urinalysis concerning for UTI.  The patient denies any urinary symptoms at the moment, states that she has been on antibiotics for UTI.  Remainder of work-up generally unremarkable.  Mild leukocytosis to 13.7 present.  No anemia.  No significant electrolyte abnormality.  Patient was initially cleared for discharge home but on attempts at ambulation in the emergency department the patient was completely unable to ambulate.  Patient likely would benefit from PT/OT inpatient and is currently not safe for discharge home.  Hospitalist medicine consulted for admission.   Final Clinical Impression(s) / ED Diagnoses Final diagnoses:  Vertigo    Rx / DC Orders ED Discharge Orders          Ordered    Ambulatory referral to Neurology       Comments: An appointment is requested in approximately: 2 weeks   04/25/21 1839             Regan Lemming, MD 04/26/21 1225

## 2021-04-26 ENCOUNTER — Observation Stay (HOSPITAL_COMMUNITY): Payer: PPO

## 2021-04-26 DIAGNOSIS — N390 Urinary tract infection, site not specified: Secondary | ICD-10-CM | POA: Diagnosis not present

## 2021-04-26 DIAGNOSIS — I480 Paroxysmal atrial fibrillation: Secondary | ICD-10-CM | POA: Diagnosis not present

## 2021-04-26 DIAGNOSIS — R42 Dizziness and giddiness: Secondary | ICD-10-CM | POA: Diagnosis not present

## 2021-04-26 DIAGNOSIS — I5022 Chronic systolic (congestive) heart failure: Secondary | ICD-10-CM | POA: Diagnosis not present

## 2021-04-26 DIAGNOSIS — M25552 Pain in left hip: Secondary | ICD-10-CM | POA: Diagnosis not present

## 2021-04-26 LAB — CBC
HCT: 32 % — ABNORMAL LOW (ref 36.0–46.0)
Hemoglobin: 10.3 g/dL — ABNORMAL LOW (ref 12.0–15.0)
MCH: 30.4 pg (ref 26.0–34.0)
MCHC: 32.2 g/dL (ref 30.0–36.0)
MCV: 94.4 fL (ref 80.0–100.0)
Platelets: 140 10*3/uL — ABNORMAL LOW (ref 150–400)
RBC: 3.39 MIL/uL — ABNORMAL LOW (ref 3.87–5.11)
RDW: 14.4 % (ref 11.5–15.5)
WBC: 9.7 10*3/uL (ref 4.0–10.5)
nRBC: 0 % (ref 0.0–0.2)

## 2021-04-26 IMAGING — DX DG HIP (WITH OR WITHOUT PELVIS) 2-3V*L*
3 series · 3 of 3 positions shown · non-contrast
Comparison: 12/28/2020

CLINICAL DATA: Lateral left hip pain

EXAM:
DG HIP (WITH OR WITHOUT PELVIS) 3V LEFT

[pelvis ap]
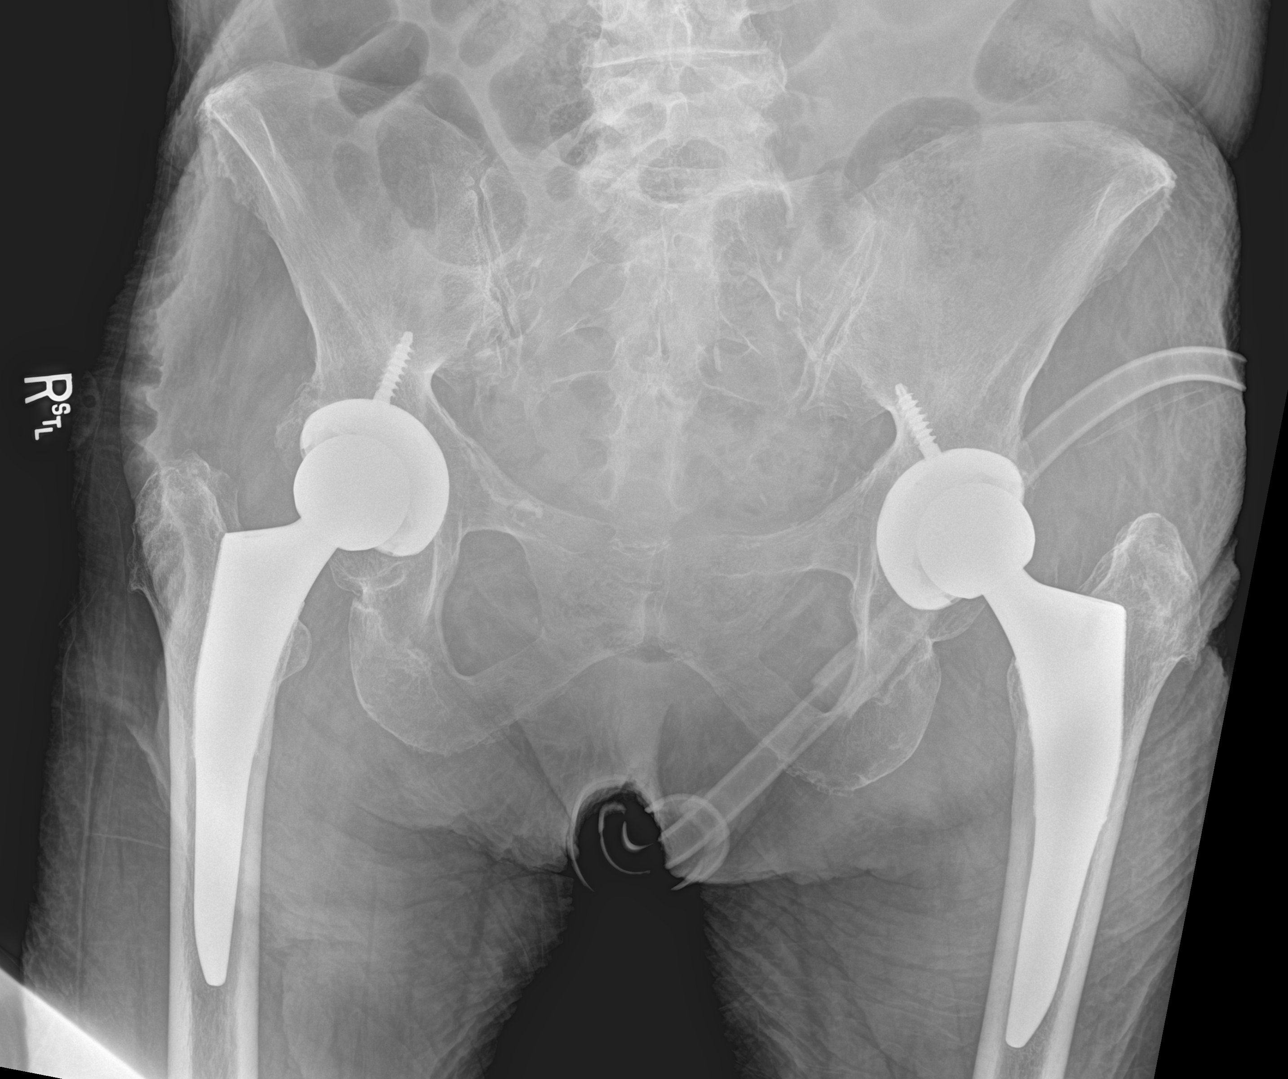

[hip ap]
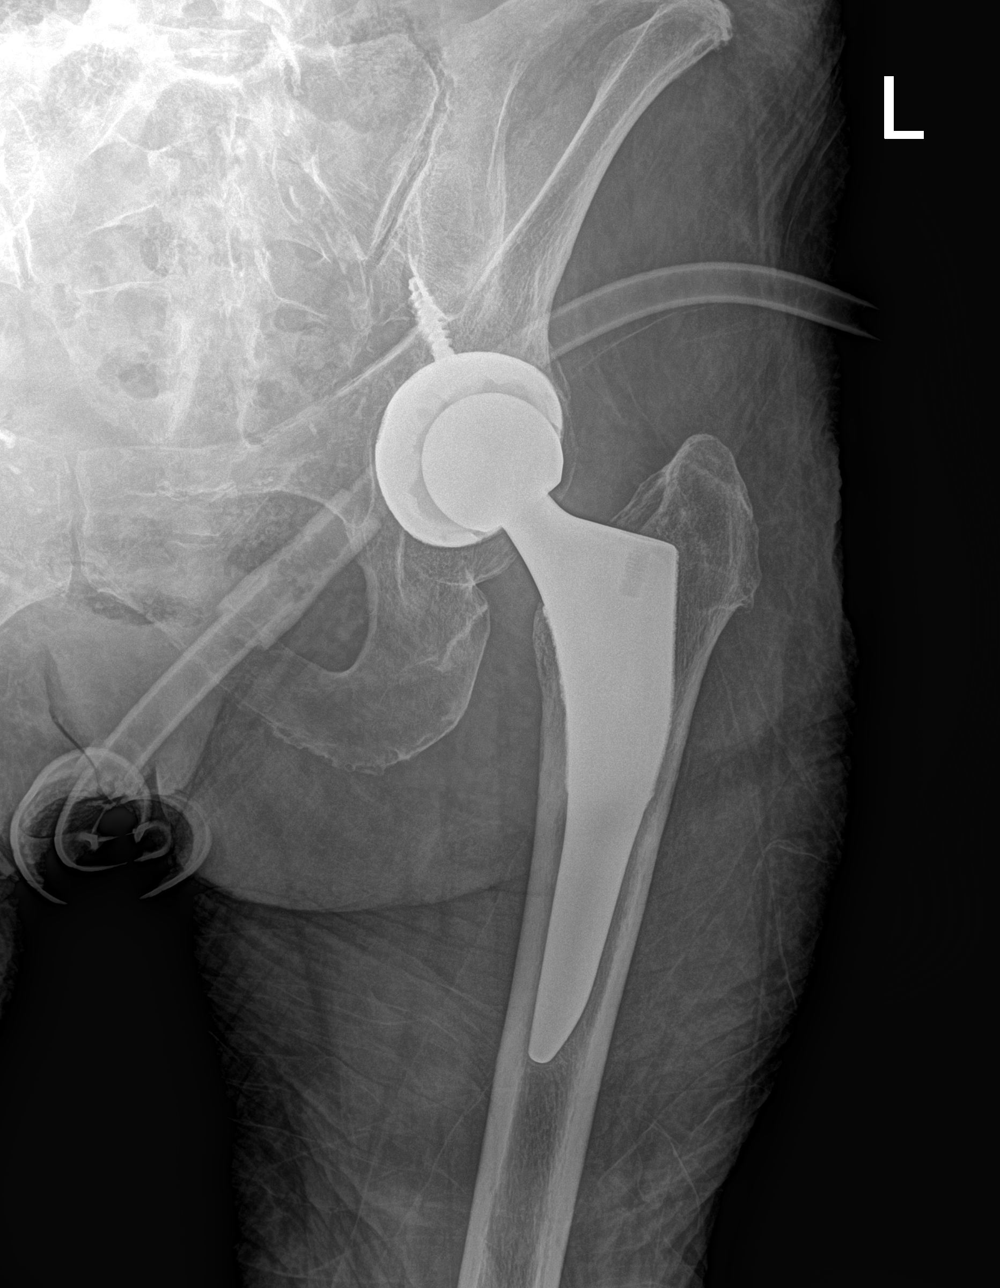

[hip lat]
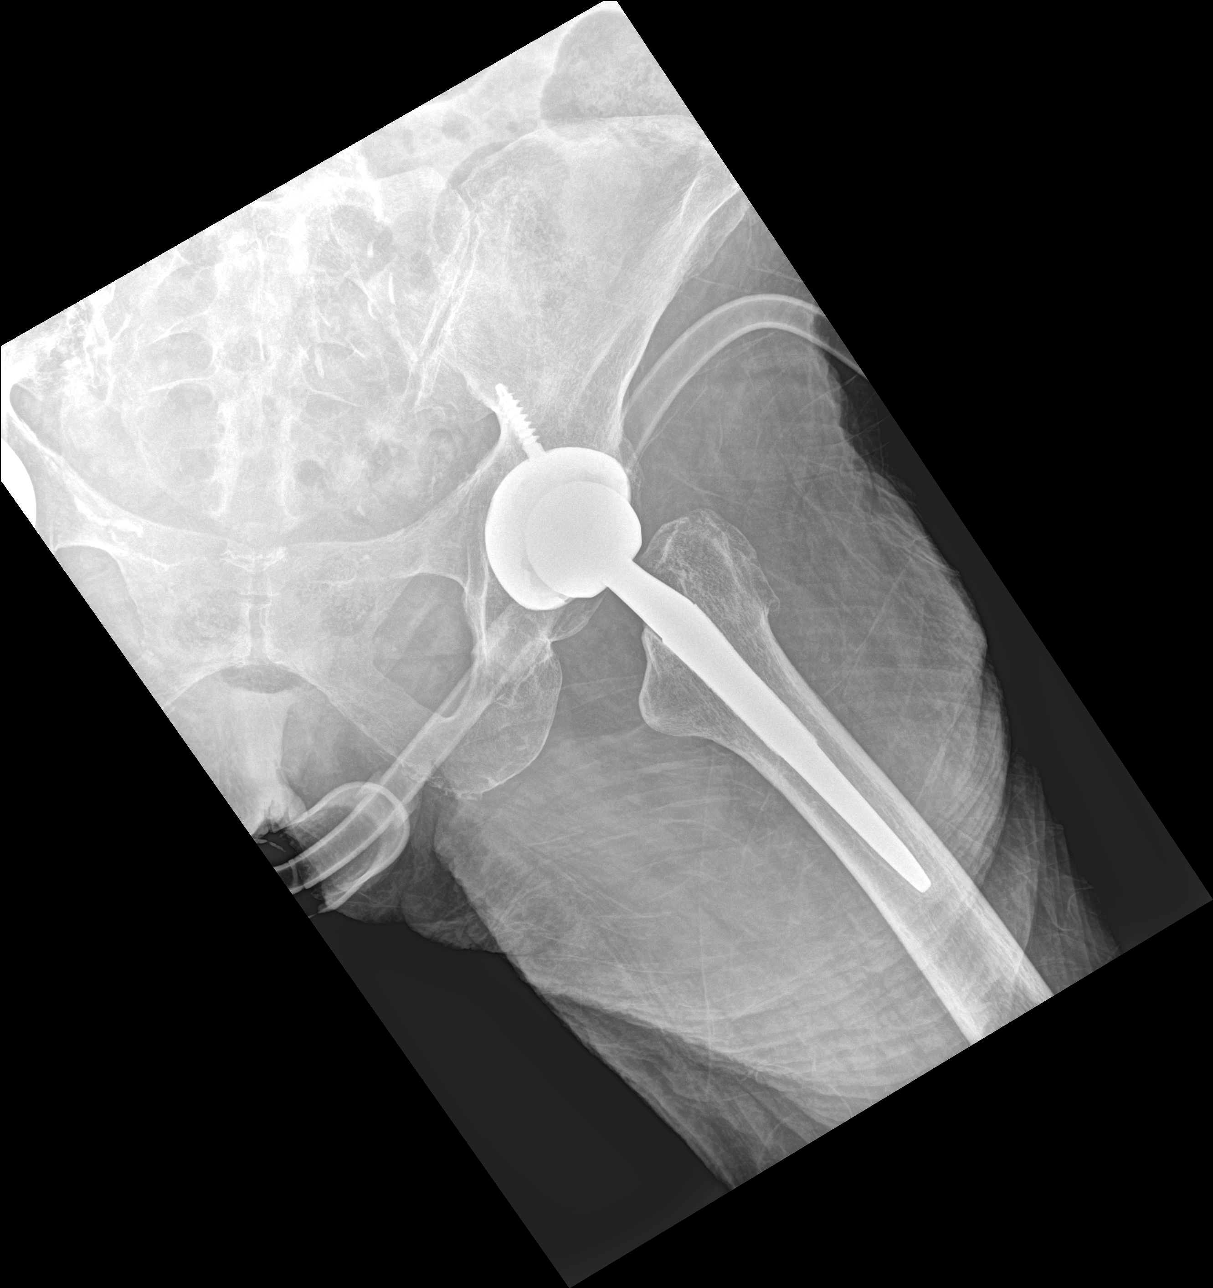

[3 of 3 positions shown; findings below may reference images not displayed]

FINDINGS: Bilateral hip replacements are noted. No acute fracture or
dislocation noted. No soft tissue abnormality is seen.
IMPRESSION: Postsurgical changes without acute abnormality.

## 2021-04-26 MED ORDER — MECLIZINE HCL 25 MG PO TABS: 25.0000 mg | ORAL_TABLET | Freq: Three times a day (TID) | ORAL | Status: DC

## 2021-04-26 MED ORDER — MECLIZINE HCL 25 MG PO TABS
25.0000 mg | ORAL_TABLET | Freq: Three times a day (TID) | ORAL | Status: DC
  Filled 2021-04-26 (×2): qty 1

## 2021-04-26 NOTE — Plan of Care (Signed)
  Problem: Activity: Goal: Risk for activity intolerance will decrease Outcome: Progressing   Problem: Pain Managment: Goal: General experience of comfort will improve Outcome: Progressing   Problem: Safety: Goal: Ability to remain free from injury will improve Outcome: Progressing   

## 2021-04-26 NOTE — Progress Notes (Signed)
PROGRESS NOTE    Heidi Adams  DQQ:[id number] DOB: 1936/06/20 DOA: 04/25/2021 PCP: [name], MD    Brief Narrative:  85 year old female with a history of chronic systolic CHF, hypertension, atrial fibrillation, presented to the hospital with difficulty ambulating, and feeling as though she was losing her balance.  MRI of the head was negative for acute infarct.  UA suspicious for UTI.  She was referred for admission.   Assessment & Plan:   Principal Problem:   Vertigo Active Problems:   Chronic systolic heart failure (HCC)   A-fib (HCC)   Acute lower UTI   Urinary tract infection -Patient reports having significant dysuria -May also be contributing to her difficulty with ambulation -Urine culture growing E. Coli -Currently on Rocephin, will de-escalate as sensitivities are available  Dizziness -Continue meclizine -Continue vestibular therapy -MRI negative for infarct -Orthostatics negative -Seen by PT with recommendations for home health  Left hip pain -Reports having difficulty ambulating due to pain -We will check portable x-ray   Chronic systolic CHF -EF of 25 to 30% -Currently appears compensated -Continue outpatient dose of Lasix  Atrial fibrillation -Continue on amiodarone -Anticoagulated with Eliquis  Hypertension -Continue home regimen   DVT prophylaxis: apixaban (ELIQUIS) tablet 2.5 mg Start: 04/25/21 2200 apixaban (ELIQUIS) tablet 2.5 mg  Code Status: DNR Family Communication: Updated patient's husband Disposition Plan: Status is: Observation  The patient remains OBS appropriate and will d/c before 2 midnights.  Dispo: The patient is from: Home              Anticipated d/c is to: Home              Patient currently is not medically stable to d/c.   Difficult to place patient No         Consultants:    Procedures:    Antimicrobials:  Ceftriaxone 10/2 >   Subjective: Complains of pain in her left hip, feels that it is  affecting her ability to ambulate.  Objective: Vitals:   04/26/21 0037 04/26/21 0544 04/26/21 0822 04/26/21 1415  BP: 129/62 (!) 137/56 (!) 157/75   Pulse: (!) 58 (!) 53 63   Resp: 16 16    Temp: 97.7 F (36.5 C) 97.9 F (36.6 C)  98.3 F (36.8 C)  TempSrc: Axillary Axillary  Oral  SpO2: 95% 96%  98%  Weight:      Height:        Intake/Output Summary (Last 24 hours) at 04/26/2021 2031 Last data filed at 04/26/2021 1839 Gross per 24 hour  Intake 659.93 ml  Output 800 ml  Net -140.07 ml   Filed Weights   04/25/21 1321  Weight: 54.4 kg    Examination:  General exam: Appears calm and comfortable  Respiratory system: Clear to auscultation. Respiratory effort normal. Cardiovascular system: S1 & S2 heard, RRR. No JVD, murmurs, rubs, gallops or clicks. No pedal edema. Gastrointestinal system: Abdomen is nondistended, soft and nontender. No organomegaly or masses felt. Normal bowel sounds heard. Central nervous system: Alert and oriented. No focal neurological deficits. Extremities: Symmetric 5 x 5 power. Skin: No rashes, lesions or ulcers Psychiatry: Judgement and insight appear normal. Mood & affect appropriate.     Data Reviewed: I have personally reviewed following labs and imaging studies  CBC: Recent Labs  Lab 04/25/21 1404 04/25/21 1412 04/26/21 0346  WBC  --  13.7* 9.7  NEUTROABS  --  11.9*  --   HGB 12.9 12.4 10.3*  HCT 38.0 39.6 32.0*  MCV  --  95.9 94.4  PLT  --  190 347*   Basic Metabolic Panel: Recent Labs  Lab 04/25/21 1404 04/25/21 1421  NA 136 139  K 3.7 3.5  CL 104 105  CO2  --  23  GLUCOSE 101* 103*  BUN 27* 29*  CREATININE 1.10* 1.12*  CALCIUM  --  9.0   GFR: Estimated Creatinine Clearance: 32.7 mL/min (A) (by C-G formula based on SCr of 1.12 mg/dL (H)). Liver Function Tests: Recent Labs  Lab 04/25/21 1421  AST 14*  ALT 10  ALKPHOS 54  BILITOT 0.8  PROT 6.3*  ALBUMIN 3.2*   No results for input(s): LIPASE, AMYLASE in the  last 168 hours. No results for input(s): AMMONIA in the last 168 hours. Coagulation Profile: Recent Labs  Lab 04/25/21 1412  INR 1.1   Cardiac Enzymes: No results for input(s): CKTOTAL, CKMB, CKMBINDEX, TROPONINI in the last 168 hours. BNP (last 3 results) No results for input(s): PROBNP in the last 8760 hours. HbA1C: No results for input(s): HGBA1C in the last 72 hours. CBG: No results for input(s): GLUCAP in the last 168 hours. Lipid Profile: No results for input(s): CHOL, HDL, LDLCALC, TRIG, CHOLHDL, LDLDIRECT in the last 72 hours. Thyroid Function Tests: No results for input(s): TSH, T4TOTAL, FREET4, T3FREE, THYROIDAB in the last 72 hours. Anemia Panel: No results for input(s): VITAMINB12, FOLATE, FERRITIN, TIBC, IRON, RETICCTPCT in the last 72 hours. Sepsis Labs: No results for input(s): PROCALCITON, LATICACIDVEN in the last 168 hours.  Recent Results (from the past 240 hour(s))  Resp Panel by RT-PCR (Flu A&B, Covid) Nasopharyngeal Swab     Status: None   Collection Time: 04/25/21  2:28 PM   Specimen: Nasopharyngeal Swab; Nasopharyngeal(NP) swabs in vial transport medium  Result Value Ref Range Status   SARS Coronavirus 2 by RT PCR NEGATIVE NEGATIVE Final    Comment: (NOTE) SARS-CoV-2 target nucleic acids are NOT DETECTED.  The SARS-CoV-2 RNA is generally detectable in upper respiratory specimens during the acute phase of infection. The lowest concentration of SARS-CoV-2 viral copies this assay can detect is 138 copies/mL. A negative result does not preclude SARS-Cov-2 infection and should not be used as the sole basis for treatment or other patient management decisions. A negative result may occur with  improper specimen collection/handling, submission of specimen other than nasopharyngeal swab, presence of viral mutation(s) within the areas targeted by this assay, and inadequate number of viral copies(<138 copies/mL). A negative result must be combined with clinical  observations, patient history, and epidemiological information. The expected result is Negative.  Fact Sheet for Patients:  EntrepreneurPulse.com.au  Fact Sheet for Healthcare Providers:  IncredibleEmployment.be  This test is no t yet approved or cleared by the [place] FDA and  has been authorized for detection and/or diagnosis of SARS-CoV-2 by FDA under an Emergency Use Authorization (EUA). This EUA will remain  in effect (meaning this test can be used) for the duration of the COVID-19 declaration under Section 564(b)(1) of the Act, 21 U.S.C.section 360bbb-3(b)(1), unless the authorization is terminated  or revoked sooner.       Influenza A by PCR NEGATIVE NEGATIVE Final   Influenza B by PCR NEGATIVE NEGATIVE Final    Comment: (NOTE) The Xpert Xpress SARS-CoV-2/FLU/RSV plus assay is intended as an aid in the diagnosis of influenza from Nasopharyngeal swab specimens and should not be used as a sole basis for treatment. Nasal washings and aspirates are unacceptable for Xpert Xpress SARS-CoV-2/FLU/RSV testing.  Fact  Sheet for Patients: EntrepreneurPulse.com.au  Fact Sheet for Healthcare Providers: IncredibleEmployment.be  This test is not yet approved or cleared by the [place] FDA and has been authorized for detection and/or diagnosis of SARS-CoV-2 by FDA under an Emergency Use Authorization (EUA). This EUA will remain in effect (meaning this test can be used) for the duration of the COVID-19 declaration under Section 564(b)(1) of the Act, 21 U.S.C. section 360bbb-3(b)(1), unless the authorization is terminated or revoked.  Performed at [facility], [place] [address], [place], [place] [address]   Urine Culture     Status: Abnormal (Preliminary result)   Collection Time: 04/25/21  7:08 PM   Specimen: Urine, Clean Catch  Result Value Ref Range Status   Specimen Description    Final    URINE, CLEAN CATCH Performed at [facility], [place] [address], [place], [place] [address]    Special Requests   Final    NONE Performed at [facility], [place] [address], [place], [place] [address]    Culture (A)  Final    >=100,000 COLONIES/mL ESCHERICHIA COLI SUSCEPTIBILITIES TO FOLLOW Performed at [facility], [place] [address] [place], [place] [address]    Report Status PENDING  Incomplete         Radiology Studies: CT HEAD WO CONTRAST  Result Date: 04/25/2021 CLINICAL DATA:  Unsteady gait. EXAM: CT HEAD WITHOUT CONTRAST TECHNIQUE: Contiguous axial images were obtained from the base of the skull through the vertex without intravenous contrast. COMPARISON:  CT head dated June 22, 2019. FINDINGS: Brain: No evidence of acute infarction, hemorrhage, hydrocephalus, extra-axial collection or mass lesion/mass effect. Progressive mild-to-moderate chronic microvascular ischemic changes. Unchanged 6 mm meningioma at the left frontal vertex. Vascular: Atherosclerotic vascular calcification of the carotid siphons. No hyperdense vessel. Skull: Normal. Negative for fracture or focal lesion. Sinuses/Orbits: No acute finding. Other: None. IMPRESSION: 1. No acute intracranial abnormality. 2. Progressive mild-to-moderate chronic microvascular ischemic changes. Electronically Signed   By: Titus Dubin M.D.   On: 04/25/2021 15:18   MR ANGIO HEAD WO CONTRAST  Result Date: 04/25/2021 CLINICAL DATA:  Dizziness, persistent/recurrent, cardiac or vascular cause suspected; Neuro deficit, acute, stroke suspected. EXAM: MRI HEAD WITHOUT AND WITH CONTRAST MRA HEAD WITHOUT CONTRAST MRA NECK WITHOUT AND WITH CONTRAST TECHNIQUE: Multiplanar, multiecho pulse sequences of the brain and surrounding structures were obtained without and with intravenous contrast. Angiographic images of the Circle of Willis were obtained using MRA technique without intravenous  contrast. Angiographic images of the neck were obtained using MRA technique without and with intravenous contrast. Carotid stenosis measurements (when applicable) are obtained utilizing NASCET criteria, using the distal internal carotid diameter as the denominator. CONTRAST:  27mL GADAVIST GADOBUTROL 1 MMOL/ML IV SOLN COMPARISON:  Head CT 04/25/2021. Head and neck CTA 07/30/2018. Head MRI and head MRA 06/24/2019. FINDINGS: MRI HEAD FINDINGS The study is intermittently moderately to severely motion degraded. Brain: There is no evidence of an acute infarct, intracranial hemorrhage, midline shift, or extra-axial fluid collection. T2 hyperintensities in the cerebral white matter bilaterally are unchanged and nonspecific but compatible with mild-to-moderate chronic small vessel ischemic disease. A 7 mm enhancing mass over the left frontal convexity near the vertex is unchanged from the prior MRI without mass effect on the underlying brain. Generalized cerebral atrophy is mild for age. Vascular: Major intracranial vascular flow voids are preserved. Skull and upper cervical spine: Unremarkable bone marrow signal. Sinuses/Orbits: Bilateral cataract extraction. Clear paranasal sinuses. Trace right mastoid fluid. Other:  None. MRA HEAD FINDINGS Signal loss in the distal V3 and proximal V4 segments bilaterally is likely artifactual. The more distal V4 segments are widely patent to the basilar with the left being dominant. The basilar artery is widely patent. Posterior communicating arteries are diminutive or absent. Both PCAs are patent without evidence of a significant proximal stenosis. The internal carotid arteries are widely patent from skull base to carotid termini. ACAs and MCAs are patent without evidence of a proximal branch occlusion or significant proximal stenosis. No aneurysm is identified. MRA NECK FINDINGS The study is degraded by moderate motion artifact as well as suboptimal timing on the contrast-enhanced MRA.  There is a standard 3 vessel aortic arch. The common carotid and cervical internal carotid arteries are patent without evidence of a flow limiting stenosis with motion artifact limiting assessment of the proximal common carotid arteries. The vertebral arteries are patent with antegrade flow bilaterally. Assessment for vertebral stenosis is severely limited by motion and venous contamination. IMPRESSION: 1. No acute intracranial abnormality. 2. Mild-to-moderate chronic small vessel ischemic disease. 3. Unchanged 7 mm left frontal vertex meningioma. 4. Negative head MRA. 5. Motion degraded neck MRA. Patent cervical carotid and vertebral arteries. Electronically Signed   By: Logan Bores M.D.   On: 04/25/2021 18:15   MR Angiogram Neck W or Wo Contrast  Result Date: 04/25/2021 CLINICAL DATA:  Dizziness, persistent/recurrent, cardiac or vascular cause suspected; Neuro deficit, acute, stroke suspected. EXAM: MRI HEAD WITHOUT AND WITH CONTRAST MRA HEAD WITHOUT CONTRAST MRA NECK WITHOUT AND WITH CONTRAST TECHNIQUE: Multiplanar, multiecho pulse sequences of the brain and surrounding structures were obtained without and with intravenous contrast. Angiographic images of the Circle of Willis were obtained using MRA technique without intravenous contrast. Angiographic images of the neck were obtained using MRA technique without and with intravenous contrast. Carotid stenosis measurements (when applicable) are obtained utilizing NASCET criteria, using the distal internal carotid diameter as the denominator. CONTRAST:  58mL GADAVIST GADOBUTROL 1 MMOL/ML IV SOLN COMPARISON:  Head CT 04/25/2021. Head and neck CTA 07/30/2018. Head MRI and head MRA 06/24/2019. FINDINGS: MRI HEAD FINDINGS The study is intermittently moderately to severely motion degraded. Brain: There is no evidence of an acute infarct, intracranial hemorrhage, midline shift, or extra-axial fluid collection. T2 hyperintensities in the cerebral white matter  bilaterally are unchanged and nonspecific but compatible with mild-to-moderate chronic small vessel ischemic disease. A 7 mm enhancing mass over the left frontal convexity near the vertex is unchanged from the prior MRI without mass effect on the underlying brain. Generalized cerebral atrophy is mild for age. Vascular: Major intracranial vascular flow voids are preserved. Skull and upper cervical spine: Unremarkable bone marrow signal. Sinuses/Orbits: Bilateral cataract extraction. Clear paranasal sinuses. Trace right mastoid fluid. Other: None. MRA HEAD FINDINGS Signal loss in the distal V3 and proximal V4 segments bilaterally is likely artifactual. The more distal V4 segments are widely patent to the basilar with the left being dominant. The basilar artery is widely patent. Posterior communicating arteries are diminutive or absent. Both PCAs are patent without evidence of a significant proximal stenosis. The internal carotid arteries are widely patent from skull base to carotid termini. ACAs and MCAs are patent without evidence of a proximal branch occlusion or significant proximal stenosis. No aneurysm is identified. MRA NECK FINDINGS The study is degraded by moderate motion artifact as well as suboptimal timing on the contrast-enhanced MRA. There is a standard 3 vessel aortic arch. The common carotid and cervical internal carotid arteries are patent without  evidence of a flow limiting stenosis with motion artifact limiting assessment of the proximal common carotid arteries. The vertebral arteries are patent with antegrade flow bilaterally. Assessment for vertebral stenosis is severely limited by motion and venous contamination. IMPRESSION: 1. No acute intracranial abnormality. 2. Mild-to-moderate chronic small vessel ischemic disease. 3. Unchanged 7 mm left frontal vertex meningioma. 4. Negative head MRA. 5. Motion degraded neck MRA. Patent cervical carotid and vertebral arteries. Electronically Signed   By:  Logan Bores M.D.   On: 04/25/2021 18:15   MR BRAIN W WO CONTRAST  Result Date: 04/25/2021 CLINICAL DATA:  Dizziness, persistent/recurrent, cardiac or vascular cause suspected; Neuro deficit, acute, stroke suspected. EXAM: MRI HEAD WITHOUT AND WITH CONTRAST MRA HEAD WITHOUT CONTRAST MRA NECK WITHOUT AND WITH CONTRAST TECHNIQUE: Multiplanar, multiecho pulse sequences of the brain and surrounding structures were obtained without and with intravenous contrast. Angiographic images of the Circle of Willis were obtained using MRA technique without intravenous contrast. Angiographic images of the neck were obtained using MRA technique without and with intravenous contrast. Carotid stenosis measurements (when applicable) are obtained utilizing NASCET criteria, using the distal internal carotid diameter as the denominator. CONTRAST:  64mL GADAVIST GADOBUTROL 1 MMOL/ML IV SOLN COMPARISON:  Head CT 04/25/2021. Head and neck CTA 07/30/2018. Head MRI and head MRA 06/24/2019. FINDINGS: MRI HEAD FINDINGS The study is intermittently moderately to severely motion degraded. Brain: There is no evidence of an acute infarct, intracranial hemorrhage, midline shift, or extra-axial fluid collection. T2 hyperintensities in the cerebral white matter bilaterally are unchanged and nonspecific but compatible with mild-to-moderate chronic small vessel ischemic disease. A 7 mm enhancing mass over the left frontal convexity near the vertex is unchanged from the prior MRI without mass effect on the underlying brain. Generalized cerebral atrophy is mild for age. Vascular: Major intracranial vascular flow voids are preserved. Skull and upper cervical spine: Unremarkable bone marrow signal. Sinuses/Orbits: Bilateral cataract extraction. Clear paranasal sinuses. Trace right mastoid fluid. Other: None. MRA HEAD FINDINGS Signal loss in the distal V3 and proximal V4 segments bilaterally is likely artifactual. The more distal V4 segments are widely  patent to the basilar with the left being dominant. The basilar artery is widely patent. Posterior communicating arteries are diminutive or absent. Both PCAs are patent without evidence of a significant proximal stenosis. The internal carotid arteries are widely patent from skull base to carotid termini. ACAs and MCAs are patent without evidence of a proximal branch occlusion or significant proximal stenosis. No aneurysm is identified. MRA NECK FINDINGS The study is degraded by moderate motion artifact as well as suboptimal timing on the contrast-enhanced MRA. There is a standard 3 vessel aortic arch. The common carotid and cervical internal carotid arteries are patent without evidence of a flow limiting stenosis with motion artifact limiting assessment of the proximal common carotid arteries. The vertebral arteries are patent with antegrade flow bilaterally. Assessment for vertebral stenosis is severely limited by motion and venous contamination. IMPRESSION: 1. No acute intracranial abnormality. 2. Mild-to-moderate chronic small vessel ischemic disease. 3. Unchanged 7 mm left frontal vertex meningioma. 4. Negative head MRA. 5. Motion degraded neck MRA. Patent cervical carotid and vertebral arteries. Electronically Signed   By: Logan Bores M.D.   On: 04/25/2021 18:15        Scheduled Meds:  amiodarone  100 mg Oral Daily   apixaban  2.5 mg Oral BID   furosemide  40 mg Oral Q72H   influenza vaccine adjuvanted  0.5 mL Intramuscular Tomorrow-1000  levothyroxine  112 mcg Oral Once per day on Mon Tue Wed Thu Fri Sat   And   [START ON 05/02/2021] levothyroxine  168 mcg Oral Once per day on Sun   losartan  25 mg Oral QHS   meclizine  25 mg Oral TID   metoprolol succinate  25 mg Oral Daily   potassium chloride  20 mEq Oral Q72H   Continuous Infusions:  cefTRIAXone (ROCEPHIN)  IV 1 g (04/25/21 2131)     LOS: 0 days    Time spent: 16mins    Kathie Dike, MD [facility]   If 7PM-7AM,  please contact night-coverage [email or web address]  04/26/2021, 8:31 PM

## 2021-04-26 NOTE — Evaluation (Signed)
Physical Therapy Evaluation Patient Details Name: Heidi Adams MRN: [id number] DOB: 03/23/1936 Today's Date: 04/26/2021  History of Present Illness  Patient is 85 y.o. female presented to [facility] with acute onset of spinning/dizziness, lightheadedness, and difficulty walking. MRI in ED negative and UA suspicious of UTI. PMH significant for OA, anemia, breast cancer, CHF, HTN, Hypothyroidism, Bil TKA, Bil THA.    Clinical Impression  Heidi Adams is 85 y.o. female admitted with above HPI and diagnosis. Patient is currently limited by functional impairments below (see PT problem list). Patient lives with her husband and is independent with RW for household/community mobility at baseline. Vestibular evaluation completed (HINTS testing with negative skew test and no nystagmus at rest or in gaze induced position. Head impulse test limited by pt unable to relax cervical spine for PROM to complete test well. Patient's symptoms seem positional and motion provoked with Rt head rotation and sit >supine movement. No nystagmus present for [facility] testing. Patient will benefit from continued skilled PT interventions to address impairments and progress independence with mobility, recommending HHPT vs OPPT follow up for additional vestibular treatment/testing. No clear vestibular etiology revealed by exam. Acute PT will follow and progress as able.        Recommendations for follow up therapy are one component of a multi-disciplinary discharge planning process, led by the attending physician.  Recommendations may be updated based on patient status, additional functional criteria and insurance authorization.  Follow Up Recommendations Home health PT;Outpatient PT (HHPT/OPPT for vestibular therapy)    Equipment Recommendations  Rolling walker with 5" wheels (youth)    Recommendations for Other Services       Precautions / Restrictions Precautions Precautions: Fall Restrictions Weight Bearing  Restrictions: No       Vestibular Assessment - 04/26/21 0001       Vestibular Assessment   General Observation Pt with no nystagmus at rest. Head tilted to Lt ~15 degrees with slight forawrd head posture.      Symptom Behavior   Subjective history of current problem Pt reports mutliple episodes of vertigo in past for several decadse. Reports ~1-2 episodes/year. States it occures when layign supine and rolling/turning her head to the Rt.    Type of Dizziness  Spinning;Unsteady with head/body turns    Frequency of Dizziness intermittent    Duration of Dizziness starts with laying down and turning head to Lt and does not stop until sitting up or turning head to Rt.    Symptom Nature Motion provoked;Positional;Intermittent    Aggravating Factors Lying supine;Turning head sideways;Rolling to right   Head to Rt   Relieving Factors Comments   sitting up from supine or turning head back to Lt   Progression of Symptoms No change since onset      Oculomotor Exam   Oculomotor Alignment Normal    Spontaneous Absent    Gaze-induced  Absent    Head shaking Horizontal Comment   absent but limited due to pt unable to relax for PROM of head   Head Shaking Vertical Comment   absent but limited due to pt unable to relax for PROM of head   Smooth Pursuits Intact    Saccades Intact      Positional Testing   Dix-Hallpike Dix-Hallpike Right;Dix-Hallpike Left      Dix-Hallpike Right   Dix-Hallpike Right Symptoms No nystagmus   symptoms provocked but no nystagmus     Dix-Hallpike Left   Dix-Hallpike Left Symptoms No nystagmus  Cognition   Cognition Orientation Level Oriented x 4      Positional Sensitivities   Sit to Supine Severe dizziness    Supine to Left Side Mild dizziness    Supine to Right Side Severe dizziness    Supine to Sitting Lightheadedness    Positional Sensitivities Comments head to Rt is primary aggravating movement, no nystagmus              Mobility  Bed  Mobility Overal bed mobility: Needs Assistance Bed Mobility: Supine to Sit;Sit to Supine;Rolling Rolling: Min assist   Supine to sit: Min assist;Mod assist Sit to supine: Min assist   General bed mobility comments: cues for use of bed rail and to sequence roll to sit up EOB. Min assist to return to supine for [employer] test and min-mod assist to sit back up.    Transfers Overall transfer level: Needs assistance Equipment used: Rolling walker (2 wheeled) Transfers: Sit to/from Stand Sit to Stand: Min assist;Min guard         General transfer comment: pt using Rt UE on walker and no use of Lt UE to rise. min guard/light assist to steady and for safety with rise from EOB. cues for safe reach back to recliner at EOS.  Ambulation/Gait Ambulation/Gait assistance: Min assist Gait Distance (Feet): 20 Feet Assistive device: Rolling walker (2 wheeled) Gait Pattern/deviations: Step-through pattern;Decreased step length - right;Decreased step length - left;Decreased stride length;Shuffle;Trunk flexed (shuffling steps, decreased hip/knee flexion with gait and poor foot clearance) Gait velocity: decr   General Gait Details: cues for posture and proximity to RW, pt shuffling steps and fixated on if she is drifting Rt/Lt during gait. no significant lateral lean/drift.  Stairs            Wheelchair Mobility    Modified Rankin (Stroke Patients Only)       Balance Overall balance assessment: Needs assistance Sitting-balance support: Feet supported Sitting balance-Leahy Scale: Good     Standing balance support: During functional activity;Bilateral upper extremity supported Standing balance-Leahy Scale: Poor                               Pertinent Vitals/Pain Pain Assessment: No/denies pain    Home Living Family/patient expects to be discharged to:: Private residence Living Arrangements: Spouse/significant other Available Help at Discharge: Family;Available 24  hours/day Type of Home: House Home Access: Stairs to enter Entrance Stairs-Rails: None Entrance Stairs-Number of Steps: 1-2 Home Layout: One level Home Equipment: Walker - 2 wheels;Bedside commode;Cane - single point;Shower seat - built in;Wheelchair - manual Additional Comments: pt reports RW is wide walker    Prior Function Level of Independence: Needs assistance   Gait / Transfers Assistance Needed: uses RW in home and outside of home  ADL's / Homemaking Assistance Needed: has assistance from husband for dressing occasionally and for donning/doffing stockings.        Hand Dominance        Extremity/Trunk Assessment   Upper Extremity Assessment Upper Extremity Assessment: Generalized weakness;LUE deficits/detail LUE Deficits / Details: Limited elbow ROM unable to fully extend    Lower Extremity Assessment Lower Extremity Assessment: Generalized weakness    Cervical / Trunk Assessment Cervical / Trunk Assessment: Kyphotic  Communication   Communication: No difficulties  Cognition Arousal/Alertness: Awake/alert Behavior During Therapy: WFL for tasks assessed/performed Overall Cognitive Status: Within Functional Limits for tasks assessed  General Comments: A&O x4, pleasant      General Comments      Exercises     Assessment/Plan    PT Assessment Patient needs continued PT services  PT Problem List Decreased strength;Decreased range of motion;Decreased activity tolerance;Decreased balance;Decreased mobility;Decreased cognition;Decreased knowledge of use of DME;Decreased safety awareness;Decreased knowledge of precautions       PT Treatment Interventions DME instruction;Stair training;Gait training;Functional mobility training;Therapeutic exercise;Therapeutic activities;Balance training;Patient/family education    PT Goals (Current goals can be found in the Care Plan section)  Acute Rehab PT Goals Patient Stated  Goal: stop being dizzy and get stronger PT Goal Formulation: With patient Time For Goal Achievement: 05/10/21 Potential to Achieve Goals: Good    Frequency Min 3X/week   Barriers to discharge        Co-evaluation               AM-PAC PT "6 Clicks" Mobility  Outcome Measure Help needed turning from your back to your side while in a flat bed without using bedrails?: A Little Help needed moving from lying on your back to sitting on the side of a flat bed without using bedrails?: A Lot Help needed moving to and from a bed to a chair (including a wheelchair)?: A Little Help needed standing up from a chair using your arms (e.g., wheelchair or bedside chair)?: A Little Help needed to walk in hospital room?: A Little Help needed climbing 3-5 steps with a railing? : A Lot 6 Click Score: 16    End of Session Equipment Utilized During Treatment: Gait belt Activity Tolerance: Patient tolerated treatment well Patient left: in chair;with call bell/phone within reach;with chair alarm set;with family/visitor present Nurse Communication: Mobility status PT Visit Diagnosis: Unsteadiness on feet (R26.81);Muscle weakness (generalized) (M62.81);Difficulty in walking, not elsewhere classified (R26.2);Dizziness and giddiness (R42)    Time: 0626-9485 PT Time Calculation (min) (ACUTE ONLY): 46 min   Charges:   PT Evaluation $PT Eval Moderate Complexity: 1 Mod PT Treatments $Therapeutic Activity: 8-22 mins $Physical Performance Test: 8-22 mins        Verner Mould, DPT Acute Rehabilitation Services Office [phone] Pager [phone]   Jacques Navy 04/26/2021, 11:49 AM

## 2021-04-27 DIAGNOSIS — I5022 Chronic systolic (congestive) heart failure: Secondary | ICD-10-CM | POA: Diagnosis not present

## 2021-04-27 DIAGNOSIS — I10 Essential (primary) hypertension: Secondary | ICD-10-CM | POA: Diagnosis not present

## 2021-04-27 DIAGNOSIS — Z09 Encounter for follow-up examination after completed treatment for conditions other than malignant neoplasm: Secondary | ICD-10-CM | POA: Diagnosis not present

## 2021-04-27 DIAGNOSIS — I5032 Chronic diastolic (congestive) heart failure: Secondary | ICD-10-CM | POA: Diagnosis not present

## 2021-04-27 DIAGNOSIS — I480 Paroxysmal atrial fibrillation: Secondary | ICD-10-CM | POA: Diagnosis not present

## 2021-04-27 DIAGNOSIS — Z87828 Personal history of other (healed) physical injury and trauma: Secondary | ICD-10-CM | POA: Diagnosis not present

## 2021-04-27 DIAGNOSIS — N390 Urinary tract infection, site not specified: Secondary | ICD-10-CM | POA: Diagnosis not present

## 2021-04-27 DIAGNOSIS — R42 Dizziness and giddiness: Secondary | ICD-10-CM | POA: Diagnosis not present

## 2021-04-27 LAB — URINE CULTURE: Culture: >=100000 — AB

## 2021-04-27 MED ORDER — MECLIZINE HCL 25 MG PO TABS: 25.0000 mg | ORAL_TABLET | Freq: Three times a day (TID) | ORAL | 0 refills | Status: DC

## 2021-04-27 MED ORDER — APIXABAN 2.5 MG PO TABS: 2.5000 mg | ORAL_TABLET | Freq: Two times a day (BID) | ORAL | 1 refills | Status: DC

## 2021-04-27 MED ORDER — CEPHALEXIN 500 MG PO CAPS: 500.0000 mg | ORAL_CAPSULE | Freq: Two times a day (BID) | ORAL | 0 refills | Status: AC

## 2021-04-27 NOTE — TOC Transition Note (Signed)
Transition of Care [facility]) - CM/SW Discharge Note   Patient Details  Name: Heidi Adams MRN: [id number] Date of Birth: 03-08-1936  [facility] CM/SW Contact:  Lennart Pall, LCSW Phone Number: 04/27/2021, 3:07 PM   Clinical Narrative:    Pt medically cleared for dc home today.  Orders placed for youth rolling walker and HHPT (vestibular).  Pt has been followed by [facility] and HHPT referral made with them.  RW ordered via [employer] for delivery to room.  NO further TOC needs.   Final next level of care: [place] Barriers to Discharge: No Barriers Identified   Patient Goals and CMS Choice Patient states their goals for this hospitalization and ongoing recovery are:: return home      Discharge Placement                       Discharge Plan and Services                DME Arranged: Gilford Rile youth DME Agency: [employer] Date DME Agency Contacted: 04/27/21 Time DME Agency Contacted: [id number] Representative spoke with at DME Agency: Freda Munro HH Arranged: PT [place]: [facility] Date [place]: 04/27/21 Time [place]: [place] Representative spoke with at [place]: [place] ([place]) Interventions     Readmission Risk Interventions No flowsheet data found.

## 2021-04-27 NOTE — Plan of Care (Signed)
  Problem: Health Behavior/Discharge Planning: Goal: Ability to manage health-related needs will improve Outcome: Progressing   Problem: Clinical Measurements: Goal: Ability to maintain clinical measurements within normal limits will improve Outcome: Progressing   Problem: Activity: Goal: Risk for activity intolerance will decrease Outcome: Progressing   Problem: Coping: Goal: Level of anxiety will decrease Outcome: Progressing   Problem: Elimination: Goal: Will not experience complications related to bowel motility Outcome: Progressing   Problem: Pain Managment: Goal: General experience of comfort will improve Outcome: Progressing   Problem: Safety: Goal: Ability to remain free from injury will improve Outcome: Progressing   

## 2021-04-27 NOTE — Progress Notes (Signed)
Physical Therapy Treatment Patient Details Name: Heidi Adams MRN: [id number] DOB: 1936/03/30 Today's Date: 04/27/2021   History of Present Illness Patient is 85 y.o. female presented to [facility] with acute onset of spinning/dizziness, lightheadedness, and difficulty walking. MRI in ED negative and UA suspicious of UTI. PMH significant for OA, anemia, breast cancer, CHF, HTN, Hypothyroidism, Bil TKA, Bil THA.    PT Comments    Patient progressing with mobility and eager to mobilize and return home. Pt completed seated balance activity with upper/lower body dressing, min assist for donning pants in sitting and pt able to pull up in standing. Patient ambulated increased distance today with RW and min guard/supervision for safety. Continues to have shuffling gait, posture improved with use of youth walker and pt maintained safe position to appropriate height walker. She will benefit from HHPT follow up for strengthening and vestibular treatment. Acute PT will continue to progress pt as able throughout stay.    Recommendations for follow up therapy are one component of a multi-disciplinary discharge planning process, led by the attending physician.  Recommendations may be updated based on patient status, additional functional criteria and insurance authorization.  Follow Up Recommendations  Home health PT;Outpatient PT (HHPT/OPPT for vestibular therapy)     Equipment Recommendations  Rolling walker with 5" wheels (youth)    Recommendations for Other Services       Precautions / Restrictions Precautions Precautions: Fall Restrictions Weight Bearing Restrictions: No     Mobility  Bed Mobility Overal bed mobility: Needs Assistance Bed Mobility: Supine to Sit     Supine to sit: Min assist;Min guard;HOB elevated     General bed mobility comments: Pt's bed at home is adjustable so HOB elevated. Pt required encouragement to initiate supine>sit herself asking husband and therapist for help.  Pt pulling on therpists hand to raise trunk upright.    Transfers Overall transfer level: Needs assistance Equipment used: Rolling walker (2 wheeled) Transfers: Sit to/from Stand Sit to Stand: Min guard         General transfer comment: pt using bil UE on RW for power up. guarding for safety with rise and cues for safe reach back when sitting in recliner.  Ambulation/Gait Ambulation/Gait assistance: Min Gaffer (Feet): 75 Feet Assistive device: Rolling walker (2 wheeled) Gait Pattern/deviations: Step-through pattern;Decreased step length - right;Decreased step length - left;Decreased stride length;Shuffle;Trunk flexed (shuffling steps, decreased hip/knee flexion with gait and poor foot clearance) Gait velocity: decr   General Gait Details: cues for posture and proximity to RW, pt shuffling steps and decreased hip/knee flexion. pt maintained safe position to youth walker compared to regular height walker. min guard/supervision for safety.   Stairs             Wheelchair Mobility    Modified Rankin (Stroke Patients Only)       Balance Overall balance assessment: Needs assistance Sitting-balance support: Feet supported Sitting balance-Leahy Scale: Good Sitting balance - Comments: pt attempted donning pants and shoes sitting EOB. pt able to don shoes, min assist required for pants. pt able to complete upper body dressing in sitting.   Standing balance support: During functional activity;Bilateral upper extremity supported Standing balance-Leahy Scale: Poor                              Cognition Arousal/Alertness: Awake/alert Behavior During Therapy: WFL for tasks assessed/performed Overall Cognitive Status: Within Functional Limits for tasks assessed  General Comments: A&O x4, pleasant      Exercises      General Comments        Pertinent Vitals/Pain Pain Assessment:  No/denies pain    Home Living                      Prior Function            PT Goals (current goals can now be found in the care plan section) Acute Rehab PT Goals Patient Stated Goal: stop being dizzy and get stronger PT Goal Formulation: With patient Time For Goal Achievement: 05/10/21 Potential to Achieve Goals: Good Progress towards PT goals: Progressing toward goals    Frequency    Min 3X/week      PT Plan Current plan remains appropriate    Co-evaluation              AM-PAC PT "6 Clicks" Mobility   Outcome Measure  Help needed turning from your back to your side while in a flat bed without using bedrails?: A Little Help needed moving from lying on your back to sitting on the side of a flat bed without using bedrails?: A Little Help needed moving to and from a bed to a chair (including a wheelchair)?: A Little Help needed standing up from a chair using your arms (e.g., wheelchair or bedside chair)?: A Little Help needed to walk in hospital room?: A Little Help needed climbing 3-5 steps with a railing? : A Lot 6 Click Score: 17    End of Session Equipment Utilized During Treatment: Gait belt Activity Tolerance: Patient tolerated treatment well Patient left: in chair;with call bell/phone within reach;with chair alarm set;with family/visitor present Nurse Communication: Mobility status PT Visit Diagnosis: Unsteadiness on feet (R26.81);Muscle weakness (generalized) (M62.81);Difficulty in walking, not elsewhere classified (R26.2);Dizziness and giddiness (R42)     Time: 5947-0761 PT Time Calculation (min) (ACUTE ONLY): 23 min  Charges:  $Gait Training: 8-22 mins $Therapeutic Activity: 8-22 mins                     Heidi Adams, DPT Acute Rehabilitation Services Office [phone] Pager [phone]    Heidi Adams 04/27/2021, 4:14 PM

## 2021-04-27 NOTE — Progress Notes (Signed)
Provided discharge education/instructions to pt and husband, all questions and concerns addressed, Pt not in acute distress. Per Pt RW will be mailed to her house. Pt discharged home with belongings.

## 2021-04-28 DIAGNOSIS — R42 Dizziness and giddiness: Secondary | ICD-10-CM | POA: Diagnosis not present

## 2021-04-28 DIAGNOSIS — I110 Hypertensive heart disease with heart failure: Secondary | ICD-10-CM | POA: Diagnosis not present

## 2021-04-28 DIAGNOSIS — N390 Urinary tract infection, site not specified: Secondary | ICD-10-CM | POA: Diagnosis not present

## 2021-04-28 NOTE — Discharge Summary (Signed)
Physician Discharge Summary  Heidi Adams GMW:[id number] DOB: [birth year] DOA: 04/25/2021  PCP: [name], MD  Admit date: 04/25/2021 Discharge date: 04/27/2021  Admitted From: home Disposition:  home  Recommendations for Outpatient Follow-up:  Follow up with PCP in 1-2 weeks Please obtain BMP/CBC in one week  Home Health:home health PT/OT Equipment/Devices:walker  Discharge Condition:stable CODE STATUS:DNR Diet recommendation: heart healthy  Brief/Interim Summary: 85 year old female with a history of chronic systolic CHF, hypertension, atrial fibrillation, presented to the hospital with difficulty ambulating, and feeling as though she was losing her balance.  MRI of the head was negative for acute infarct.  UA suspicious for UTI.  She was referred for admission.  Discharge Diagnoses:  Principal Problem:   Vertigo Active Problems:   Chronic systolic heart failure (HCC)   A-fib (HCC)   Acute lower UTI  Urinary tract infection -Patient reports having significant dysuria -May also be contributing to her difficulty with ambulation -Urine culture growing E. Coli -Currently on Rocephin, antibiotic transitioned to keflex   Dizziness -Continue meclizine -Continue vestibular therapy -MRI negative for infarct -Orthostatics negative -Seen by PT with recommendations for home health   Left hip pain -Reports having difficulty ambulating due to pain -hip xray negative   Chronic systolic CHF -EF of 25 to 30% -Currently appears compensated -Continue outpatient dose of Lasix   Atrial fibrillation -Continue on amiodarone -Anticoagulated with Eliquis   Hypertension -Continue home regimen  Discharge Instructions  Discharge Instructions     Ambulatory referral to Neurology   Complete by: As directed    An appointment is requested in approximately: 2 weeks   Diet - low sodium heart healthy   Complete by: As directed    Increase activity slowly   Complete by: As directed        Allergies as of 04/27/2021       Reactions   Codeine Nausea Only   Epinephrine    Tachcardyia, chest pains tremors   Tenormin [atenolol] Rash        Medication List     STOP taking these medications    ibuprofen 200 MG tablet Commonly known as: ADVIL       TAKE these medications    acetaminophen 500 MG tablet Commonly known as: TYLENOL Take 1,000 mg by mouth 3 (three) times daily as needed for moderate pain. Notes to patient: Last dose given 10/04 03:56am   amiodarone 200 MG tablet Commonly known as: PACERONE TAKE 1 TABLET BY MOUTH EVERY DAY What changed: how much to take   apixaban 2.5 MG Tabs tablet Commonly known as: ELIQUIS Take 1 tablet (2.5 mg total) by mouth 2 (two) times daily.   cephALEXin 500 MG capsule Commonly known as: KEFLEX Take 1 capsule (500 mg total) by mouth 2 (two) times daily for 5 days.   furosemide 40 MG tablet Commonly known as: LASIX Take 1 tablet (40 mg total) by mouth every other day. Take daily if weight up by 3 Lbs or shortness of breath What changed:  when to take this additional instructions Notes to patient: Last dose given 10/03 08:21am   levothyroxine 112 MCG tablet Commonly known as: SYNTHROID Take 112-168 mcg by mouth daily before breakfast. Take 1 tablet (112 mcg) on Mondays, Tuesdays, Wednesdays, Thursdays, Fridays & Saturdays. Take 1.5 tablet (168 mcg) on Sundays   losartan 50 MG tablet Commonly known as: COZAAR TAKE 1 TABLET BY MOUTH EACH NIGHT AT BEDTIME What changed: See the new instructions.   meclizine 25 MG tablet  Commonly known as: ANTIVERT Take 1 tablet (25 mg total) by mouth 3 (three) times daily.   metoprolol succinate 50 MG 24 hr tablet Commonly known as: TOPROL-XL Take 25 mg by mouth daily. Take with or immediately following a meal.   pantoprazole 40 MG tablet Commonly known as: PROTONIX Take 40 mg by mouth daily as needed (indigestion/heartburn.). Notes to patient: Resume home regimen    potassium chloride 10 MEQ tablet Commonly known as: KLOR-CON Take 2 tablets (20 mEq total) by mouth every other day. Take every time with furosemide What changed:  when to take this additional instructions Notes to patient: Last dose given 10/03 08:21am        Follow-up Information     [facility]. Schedule an appointment as soon as possible for a visit .   Contact information: [place], [address], [facility] Follow up.   Specialty: Home Health Services Why: to provide home health physical therapy Contact information: [address] [place] [phone]                Allergies  Allergen Reactions   Codeine Nausea Only   Epinephrine     Tachcardyia, chest pains tremors   Tenormin [Atenolol] Rash    Consultations:    Procedures/Studies: CT HEAD WO CONTRAST  Result Date: 04/25/2021 CLINICAL DATA:  Unsteady gait. EXAM: CT HEAD WITHOUT CONTRAST TECHNIQUE: Contiguous axial images were obtained from the base of the skull through the vertex without intravenous contrast. COMPARISON:  CT head dated June 22, 2019. FINDINGS: Brain: No evidence of acute infarction, hemorrhage, hydrocephalus, extra-axial collection or mass lesion/mass effect. Progressive mild-to-moderate chronic microvascular ischemic changes. Unchanged 6 mm meningioma at the left frontal vertex. Vascular: Atherosclerotic vascular calcification of the carotid siphons. No hyperdense vessel. Skull: Normal. Negative for fracture or focal lesion. Sinuses/Orbits: No acute finding. Other: None. IMPRESSION: 1. No acute intracranial abnormality. 2. Progressive mild-to-moderate chronic microvascular ischemic changes. Electronically Signed   By: Titus Dubin M.D.   On: 04/25/2021 15:18   MR ANGIO HEAD WO CONTRAST  Result Date: 04/25/2021 CLINICAL DATA:  Dizziness, persistent/recurrent, cardiac or vascular  cause suspected; Neuro deficit, acute, stroke suspected. EXAM: MRI HEAD WITHOUT AND WITH CONTRAST MRA HEAD WITHOUT CONTRAST MRA NECK WITHOUT AND WITH CONTRAST TECHNIQUE: Multiplanar, multiecho pulse sequences of the brain and surrounding structures were obtained without and with intravenous contrast. Angiographic images of the Circle of Willis were obtained using MRA technique without intravenous contrast. Angiographic images of the neck were obtained using MRA technique without and with intravenous contrast. Carotid stenosis measurements (when applicable) are obtained utilizing NASCET criteria, using the distal internal carotid diameter as the denominator. CONTRAST:  48mL GADAVIST GADOBUTROL 1 MMOL/ML IV SOLN COMPARISON:  Head CT 04/25/2021. Head and neck CTA 07/30/2018. Head MRI and head MRA 06/24/2019. FINDINGS: MRI HEAD FINDINGS The study is intermittently moderately to severely motion degraded. Brain: There is no evidence of an acute infarct, intracranial hemorrhage, midline shift, or extra-axial fluid collection. T2 hyperintensities in the cerebral white matter bilaterally are unchanged and nonspecific but compatible with mild-to-moderate chronic small vessel ischemic disease. A 7 mm enhancing mass over the left frontal convexity near the vertex is unchanged from the prior MRI without mass effect on the underlying brain. Generalized cerebral atrophy is mild for age. Vascular: Major intracranial vascular flow voids are preserved. Skull and upper cervical spine: Unremarkable bone  marrow signal. Sinuses/Orbits: Bilateral cataract extraction. Clear paranasal sinuses. Trace right mastoid fluid. Other: None. MRA HEAD FINDINGS Signal loss in the distal V3 and proximal V4 segments bilaterally is likely artifactual. The more distal V4 segments are widely patent to the basilar with the left being dominant. The basilar artery is widely patent. Posterior communicating arteries are diminutive or absent. Both PCAs are patent  without evidence of a significant proximal stenosis. The internal carotid arteries are widely patent from skull base to carotid termini. ACAs and MCAs are patent without evidence of a proximal branch occlusion or significant proximal stenosis. No aneurysm is identified. MRA NECK FINDINGS The study is degraded by moderate motion artifact as well as suboptimal timing on the contrast-enhanced MRA. There is a standard 3 vessel aortic arch. The common carotid and cervical internal carotid arteries are patent without evidence of a flow limiting stenosis with motion artifact limiting assessment of the proximal common carotid arteries. The vertebral arteries are patent with antegrade flow bilaterally. Assessment for vertebral stenosis is severely limited by motion and venous contamination. IMPRESSION: 1. No acute intracranial abnormality. 2. Mild-to-moderate chronic small vessel ischemic disease. 3. Unchanged 7 mm left frontal vertex meningioma. 4. Negative head MRA. 5. Motion degraded neck MRA. Patent cervical carotid and vertebral arteries. Electronically Signed   By: Logan Bores M.D.   On: 04/25/2021 18:15   MR Angiogram Neck W or Wo Contrast  Result Date: 04/25/2021 CLINICAL DATA:  Dizziness, persistent/recurrent, cardiac or vascular cause suspected; Neuro deficit, acute, stroke suspected. EXAM: MRI HEAD WITHOUT AND WITH CONTRAST MRA HEAD WITHOUT CONTRAST MRA NECK WITHOUT AND WITH CONTRAST TECHNIQUE: Multiplanar, multiecho pulse sequences of the brain and surrounding structures were obtained without and with intravenous contrast. Angiographic images of the Circle of Willis were obtained using MRA technique without intravenous contrast. Angiographic images of the neck were obtained using MRA technique without and with intravenous contrast. Carotid stenosis measurements (when applicable) are obtained utilizing NASCET criteria, using the distal internal carotid diameter as the denominator. CONTRAST:  22mL GADAVIST  GADOBUTROL 1 MMOL/ML IV SOLN COMPARISON:  Head CT 04/25/2021. Head and neck CTA 07/30/2018. Head MRI and head MRA 06/24/2019. FINDINGS: MRI HEAD FINDINGS The study is intermittently moderately to severely motion degraded. Brain: There is no evidence of an acute infarct, intracranial hemorrhage, midline shift, or extra-axial fluid collection. T2 hyperintensities in the cerebral white matter bilaterally are unchanged and nonspecific but compatible with mild-to-moderate chronic small vessel ischemic disease. A 7 mm enhancing mass over the left frontal convexity near the vertex is unchanged from the prior MRI without mass effect on the underlying brain. Generalized cerebral atrophy is mild for age. Vascular: Major intracranial vascular flow voids are preserved. Skull and upper cervical spine: Unremarkable bone marrow signal. Sinuses/Orbits: Bilateral cataract extraction. Clear paranasal sinuses. Trace right mastoid fluid. Other: None. MRA HEAD FINDINGS Signal loss in the distal V3 and proximal V4 segments bilaterally is likely artifactual. The more distal V4 segments are widely patent to the basilar with the left being dominant. The basilar artery is widely patent. Posterior communicating arteries are diminutive or absent. Both PCAs are patent without evidence of a significant proximal stenosis. The internal carotid arteries are widely patent from skull base to carotid termini. ACAs and MCAs are patent without evidence of a proximal branch occlusion or significant proximal stenosis. No aneurysm is identified. MRA NECK FINDINGS The study is degraded by moderate motion artifact as well as suboptimal timing on the contrast-enhanced MRA. There is a standard 3  vessel aortic arch. The common carotid and cervical internal carotid arteries are patent without evidence of a flow limiting stenosis with motion artifact limiting assessment of the proximal common carotid arteries. The vertebral arteries are patent with antegrade flow  bilaterally. Assessment for vertebral stenosis is severely limited by motion and venous contamination. IMPRESSION: 1. No acute intracranial abnormality. 2. Mild-to-moderate chronic small vessel ischemic disease. 3. Unchanged 7 mm left frontal vertex meningioma. 4. Negative head MRA. 5. Motion degraded neck MRA. Patent cervical carotid and vertebral arteries. Electronically Signed   By: Logan Bores M.D.   On: 04/25/2021 18:15   MR BRAIN W WO CONTRAST  Result Date: 04/25/2021 CLINICAL DATA:  Dizziness, persistent/recurrent, cardiac or vascular cause suspected; Neuro deficit, acute, stroke suspected. EXAM: MRI HEAD WITHOUT AND WITH CONTRAST MRA HEAD WITHOUT CONTRAST MRA NECK WITHOUT AND WITH CONTRAST TECHNIQUE: Multiplanar, multiecho pulse sequences of the brain and surrounding structures were obtained without and with intravenous contrast. Angiographic images of the Circle of Willis were obtained using MRA technique without intravenous contrast. Angiographic images of the neck were obtained using MRA technique without and with intravenous contrast. Carotid stenosis measurements (when applicable) are obtained utilizing NASCET criteria, using the distal internal carotid diameter as the denominator. CONTRAST:  36mL GADAVIST GADOBUTROL 1 MMOL/ML IV SOLN COMPARISON:  Head CT 04/25/2021. Head and neck CTA 07/30/2018. Head MRI and head MRA 06/24/2019. FINDINGS: MRI HEAD FINDINGS The study is intermittently moderately to severely motion degraded. Brain: There is no evidence of an acute infarct, intracranial hemorrhage, midline shift, or extra-axial fluid collection. T2 hyperintensities in the cerebral white matter bilaterally are unchanged and nonspecific but compatible with mild-to-moderate chronic small vessel ischemic disease. A 7 mm enhancing mass over the left frontal convexity near the vertex is unchanged from the prior MRI without mass effect on the underlying brain. Generalized cerebral atrophy is mild for age.  Vascular: Major intracranial vascular flow voids are preserved. Skull and upper cervical spine: Unremarkable bone marrow signal. Sinuses/Orbits: Bilateral cataract extraction. Clear paranasal sinuses. Trace right mastoid fluid. Other: None. MRA HEAD FINDINGS Signal loss in the distal V3 and proximal V4 segments bilaterally is likely artifactual. The more distal V4 segments are widely patent to the basilar with the left being dominant. The basilar artery is widely patent. Posterior communicating arteries are diminutive or absent. Both PCAs are patent without evidence of a significant proximal stenosis. The internal carotid arteries are widely patent from skull base to carotid termini. ACAs and MCAs are patent without evidence of a proximal branch occlusion or significant proximal stenosis. No aneurysm is identified. MRA NECK FINDINGS The study is degraded by moderate motion artifact as well as suboptimal timing on the contrast-enhanced MRA. There is a standard 3 vessel aortic arch. The common carotid and cervical internal carotid arteries are patent without evidence of a flow limiting stenosis with motion artifact limiting assessment of the proximal common carotid arteries. The vertebral arteries are patent with antegrade flow bilaterally. Assessment for vertebral stenosis is severely limited by motion and venous contamination. IMPRESSION: 1. No acute intracranial abnormality. 2. Mild-to-moderate chronic small vessel ischemic disease. 3. Unchanged 7 mm left frontal vertex meningioma. 4. Negative head MRA. 5. Motion degraded neck MRA. Patent cervical carotid and vertebral arteries. Electronically Signed   By: Logan Bores M.D.   On: 04/25/2021 18:15   DG HIP UNILAT WITH PELVIS 2-3 VIEWS LEFT  Result Date: 04/26/2021 CLINICAL DATA:  Lateral left hip pain EXAM: DG HIP (WITH OR WITHOUT PELVIS) 3V LEFT  COMPARISON:  12/28/2020 FINDINGS: Bilateral hip replacements are noted. No acute fracture or dislocation noted. No  soft tissue abnormality is seen. IMPRESSION: Postsurgical changes without acute abnormality. Electronically Signed   By: Inez Catalina M.D.   On: 04/26/2021 21:04      Subjective: No new complaints  Discharge Exam: Vitals:   04/26/21 2239 04/27/21 0513 04/27/21 0840 04/27/21 1442  BP: (!) 169/72 (!) 167/77 (!) 154/76 (!) 172/82  Pulse: 63 68 69 69  Resp: 16 15 14 16   Temp: 97.9 F (36.6 C) 98.8 F (37.1 C) 98.3 F (36.8 C) 98.4 F (36.9 C)  TempSrc: Oral Oral Oral Oral  SpO2: 97% 97% 98% 98%  Weight:      Height:        General: Pt is alert, awake, not in acute distress Cardiovascular: RRR, S1/S2 +, no rubs, no gallops Respiratory: CTA bilaterally, no wheezing, no rhonchi Abdominal: Soft, NT, ND, bowel sounds + Extremities: no edema, no cyanosis    The results of significant diagnostics from this hospitalization (including imaging, microbiology, ancillary and laboratory) are listed below for reference.     Microbiology: Recent Results (from the past 240 hour(s))  Resp Panel by RT-PCR (Flu A&B, Covid) Nasopharyngeal Swab     Status: None   Collection Time: 04/25/21  2:28 PM   Specimen: Nasopharyngeal Swab; Nasopharyngeal(NP) swabs in vial transport medium  Result Value Ref Range Status   SARS Coronavirus 2 by RT PCR NEGATIVE NEGATIVE Final    Comment: (NOTE) SARS-CoV-2 target nucleic acids are NOT DETECTED.  The SARS-CoV-2 RNA is generally detectable in upper respiratory specimens during the acute phase of infection. The lowest concentration of SARS-CoV-2 viral copies this assay can detect is 138 copies/mL. A negative result does not preclude SARS-Cov-2 infection and should not be used as the sole basis for treatment or other patient management decisions. A negative result may occur with  improper specimen collection/handling, submission of specimen other than nasopharyngeal swab, presence of viral mutation(s) within the areas targeted by this assay, and inadequate  number of viral copies(<138 copies/mL). A negative result must be combined with clinical observations, patient history, and epidemiological information. The expected result is Negative.  Fact Sheet for Patients:  EntrepreneurPulse.com.au  Fact Sheet for Healthcare Providers:  IncredibleEmployment.be  This test is no t yet approved or cleared by the [place] FDA and  has been authorized for detection and/or diagnosis of SARS-CoV-2 by FDA under an Emergency Use Authorization (EUA). This EUA will remain  in effect (meaning this test can be used) for the duration of the COVID-19 declaration under Section 564(b)(1) of the Act, 21 U.S.C.section 360bbb-3(b)(1), unless the authorization is terminated  or revoked sooner.       Influenza A by PCR NEGATIVE NEGATIVE Final   Influenza B by PCR NEGATIVE NEGATIVE Final    Comment: (NOTE) The Xpert Xpress SARS-CoV-2/FLU/RSV plus assay is intended as an aid in the diagnosis of influenza from Nasopharyngeal swab specimens and should not be used as a sole basis for treatment. Nasal washings and aspirates are unacceptable for Xpert Xpress SARS-CoV-2/FLU/RSV testing.  Fact Sheet for Patients: EntrepreneurPulse.com.au  Fact Sheet for Healthcare Providers: IncredibleEmployment.be  This test is not yet approved or cleared by the [place] FDA and has been authorized for detection and/or diagnosis of SARS-CoV-2 by FDA under an Emergency Use Authorization (EUA). This EUA will remain in effect (meaning this test can be used) for the duration of the COVID-19 declaration under Section 564(b)(1) of  the Act, 21 U.S.C. section 360bbb-3(b)(1), unless the authorization is terminated or revoked.  Performed at [facility], [place] [address] [place], [place] [address]   Urine Culture     Status: Abnormal   Collection Time: 04/25/21  7:08 PM   Specimen: Urine,  Clean Catch  Result Value Ref Range Status   Specimen Description   Final    URINE, CLEAN CATCH Performed at [facility], [place] [address] [place], [place] [address]    Special Requests   Final    NONE Performed at [facility], [place] [address], [place], [place] [address]    Culture >=100,000 COLONIES/mL ESCHERICHIA COLI (A)  Final   Report Status 04/27/2021 FINAL  Final   Organism ID, Bacteria ESCHERICHIA COLI (A)  Final      Susceptibility   Escherichia coli - MIC*    AMPICILLIN >=32 RESISTANT Resistant     CEFAZOLIN <=4 SENSITIVE Sensitive     CEFEPIME <=0.12 SENSITIVE Sensitive     CEFTRIAXONE <=0.25 SENSITIVE Sensitive     CIPROFLOXACIN <=0.25 SENSITIVE Sensitive     GENTAMICIN <=1 SENSITIVE Sensitive     IMIPENEM <=0.25 SENSITIVE Sensitive     NITROFURANTOIN <=16 SENSITIVE Sensitive     TRIMETH/SULFA <=20 SENSITIVE Sensitive     AMPICILLIN/SULBACTAM >=32 RESISTANT Resistant     PIP/TAZO <=4 SENSITIVE Sensitive     * >=100,000 COLONIES/mL ESCHERICHIA COLI     Labs: BNP (last 3 results) Recent Labs    08/17/20 1026 09/09/20 1320 02/06/21 0828  BNP 327.1* 1,136.5* 4,496.7*   Basic Metabolic Panel: Recent Labs  Lab 04/25/21 1404 04/25/21 1421  NA 136 139  K 3.7 3.5  CL 104 105  CO2  --  23  GLUCOSE 101* 103*  BUN 27* 29*  CREATININE 1.10* 1.12*  CALCIUM  --  9.0   Liver Function Tests: Recent Labs  Lab 04/25/21 1421  AST 14*  ALT 10  ALKPHOS 54  BILITOT 0.8  PROT 6.3*  ALBUMIN 3.2*   No results for input(s): LIPASE, AMYLASE in the last 168 hours. No results for input(s): AMMONIA in the last 168 hours. CBC: Recent Labs  Lab 04/25/21 1404 04/25/21 1412 04/26/21 0346  WBC  --  13.7* 9.7  NEUTROABS  --  11.9*  --   HGB 12.9 12.4 10.3*  HCT 38.0 39.6 32.0*  MCV  --  95.9 94.4  PLT  --  190 140*   Cardiac Enzymes: No results for input(s): CKTOTAL, CKMB, CKMBINDEX, TROPONINI in the last 168  hours. BNP: Invalid input(s): POCBNP CBG: No results for input(s): GLUCAP in the last 168 hours. D-Dimer No results for input(s): DDIMER in the last 72 hours. Hgb A1c No results for input(s): HGBA1C in the last 72 hours. Lipid Profile No results for input(s): CHOL, HDL, LDLCALC, TRIG, CHOLHDL, LDLDIRECT in the last 72 hours. Thyroid function studies No results for input(s): TSH, T4TOTAL, T3FREE, THYROIDAB in the last 72 hours.  Invalid input(s): FREET3 Anemia work up No results for input(s): VITAMINB12, FOLATE, FERRITIN, TIBC, IRON, RETICCTPCT in the last 72 hours. Urinalysis    Component Value Date/Time   COLORURINE YELLOW (A) 04/25/2021 1908   APPEARANCEUR HAZY (A) 04/25/2021 1908   LABSPEC 1.010 04/25/2021 1908   PHURINE 7.0 04/25/2021 1908   GLUCOSEU NEGATIVE 04/25/2021 1908   HGBUR TRACE (A) 04/25/2021 1908   BILIRUBINUR NEGATIVE 04/25/2021 1908   KETONESUR NEGATIVE 04/25/2021 1908   PROTEINUR 100 (A) 04/25/2021 1908  NITRITE NEGATIVE 04/25/2021 1908   LEUKOCYTESUR SMALL (A) 04/25/2021 1908   Sepsis Labs Invalid input(s): PROCALCITONIN,  WBC,  LACTICIDVEN Microbiology Recent Results (from the past 240 hour(s))  Resp Panel by RT-PCR (Flu A&B, Covid) Nasopharyngeal Swab     Status: None   Collection Time: 04/25/21  2:28 PM   Specimen: Nasopharyngeal Swab; Nasopharyngeal(NP) swabs in vial transport medium  Result Value Ref Range Status   SARS Coronavirus 2 by RT PCR NEGATIVE NEGATIVE Final    Comment: (NOTE) SARS-CoV-2 target nucleic acids are NOT DETECTED.  The SARS-CoV-2 RNA is generally detectable in upper respiratory specimens during the acute phase of infection. The lowest concentration of SARS-CoV-2 viral copies this assay can detect is 138 copies/mL. A negative result does not preclude SARS-Cov-2 infection and should not be used as the sole basis for treatment or other patient management decisions. A negative result may occur with  improper specimen  collection/handling, submission of specimen other than nasopharyngeal swab, presence of viral mutation(s) within the areas targeted by this assay, and inadequate number of viral copies(<138 copies/mL). A negative result must be combined with clinical observations, patient history, and epidemiological information. The expected result is Negative.  Fact Sheet for Patients:  EntrepreneurPulse.com.au  Fact Sheet for Healthcare Providers:  IncredibleEmployment.be  This test is no t yet approved or cleared by the [place] FDA and  has been authorized for detection and/or diagnosis of SARS-CoV-2 by FDA under an Emergency Use Authorization (EUA). This EUA will remain  in effect (meaning this test can be used) for the duration of the COVID-19 declaration under Section 564(b)(1) of the Act, 21 U.S.C.section 360bbb-3(b)(1), unless the authorization is terminated  or revoked sooner.       Influenza A by PCR NEGATIVE NEGATIVE Final   Influenza B by PCR NEGATIVE NEGATIVE Final    Comment: (NOTE) The Xpert Xpress SARS-CoV-2/FLU/RSV plus assay is intended as an aid in the diagnosis of influenza from Nasopharyngeal swab specimens and should not be used as a sole basis for treatment. Nasal washings and aspirates are unacceptable for Xpert Xpress SARS-CoV-2/FLU/RSV testing.  Fact Sheet for Patients: EntrepreneurPulse.com.au  Fact Sheet for Healthcare Providers: IncredibleEmployment.be  This test is not yet approved or cleared by the [place] FDA and has been authorized for detection and/or diagnosis of SARS-CoV-2 by FDA under an Emergency Use Authorization (EUA). This EUA will remain in effect (meaning this test can be used) for the duration of the COVID-19 declaration under Section 564(b)(1) of the Act, 21 U.S.C. section 360bbb-3(b)(1), unless the authorization is terminated or revoked.  Performed at [facility], [place] [address], [place], [place] [address]   Urine Culture     Status: Abnormal   Collection Time: 04/25/21  7:08 PM   Specimen: Urine, Clean Catch  Result Value Ref Range Status   Specimen Description   Final    URINE, CLEAN CATCH Performed at [facility], [place] [address], [place], [place] [address]    Special Requests   Final    NONE Performed at [facility], [place] [address], [place], [place] [address]    Culture >=100,000 COLONIES/mL ESCHERICHIA COLI (A)  Final   Report Status 04/27/2021 FINAL  Final   Organism ID, Bacteria ESCHERICHIA COLI (A)  Final      Susceptibility   Escherichia coli - MIC*    AMPICILLIN >=32 RESISTANT Resistant     CEFAZOLIN <=4 SENSITIVE Sensitive     CEFEPIME <=0.12 SENSITIVE  Sensitive     CEFTRIAXONE <=0.25 SENSITIVE Sensitive     CIPROFLOXACIN <=0.25 SENSITIVE Sensitive     GENTAMICIN <=1 SENSITIVE Sensitive     IMIPENEM <=0.25 SENSITIVE Sensitive     NITROFURANTOIN <=16 SENSITIVE Sensitive     TRIMETH/SULFA <=20 SENSITIVE Sensitive     AMPICILLIN/SULBACTAM >=32 RESISTANT Resistant     PIP/TAZO <=4 SENSITIVE Sensitive     * >=100,000 COLONIES/mL ESCHERICHIA COLI     Time coordinating discharge: 71mins  SIGNED:   Kathie Dike, MD  [facility] 04/28/2021, 12:27 AM   If 7PM-7AM, please contact night-coverage [email or web address]

## 2021-05-02 DIAGNOSIS — Z87440 Personal history of urinary (tract) infections: Secondary | ICD-10-CM | POA: Diagnosis not present

## 2021-05-02 DIAGNOSIS — I110 Hypertensive heart disease with heart failure: Secondary | ICD-10-CM | POA: Diagnosis not present

## 2021-05-02 DIAGNOSIS — Z79899 Other long term (current) drug therapy: Secondary | ICD-10-CM | POA: Diagnosis not present

## 2021-05-02 DIAGNOSIS — Z7901 Long term (current) use of anticoagulants: Secondary | ICD-10-CM | POA: Diagnosis not present

## 2021-05-02 DIAGNOSIS — I5023 Acute on chronic systolic (congestive) heart failure: Secondary | ICD-10-CM | POA: Diagnosis not present

## 2021-05-02 DIAGNOSIS — I4891 Unspecified atrial fibrillation: Secondary | ICD-10-CM | POA: Diagnosis not present

## 2021-05-05 DIAGNOSIS — Z23 Encounter for immunization: Secondary | ICD-10-CM | POA: Diagnosis not present

## 2021-05-05 DIAGNOSIS — Z7901 Long term (current) use of anticoagulants: Secondary | ICD-10-CM | POA: Diagnosis not present

## 2021-05-05 DIAGNOSIS — D649 Anemia, unspecified: Secondary | ICD-10-CM | POA: Diagnosis not present

## 2021-05-05 DIAGNOSIS — E039 Hypothyroidism, unspecified: Secondary | ICD-10-CM | POA: Diagnosis not present

## 2021-05-05 DIAGNOSIS — I5042 Chronic combined systolic (congestive) and diastolic (congestive) heart failure: Secondary | ICD-10-CM | POA: Diagnosis not present

## 2021-05-05 DIAGNOSIS — I10 Essential (primary) hypertension: Secondary | ICD-10-CM | POA: Diagnosis not present

## 2021-05-05 DIAGNOSIS — R42 Dizziness and giddiness: Secondary | ICD-10-CM | POA: Diagnosis not present

## 2021-05-05 DIAGNOSIS — M25552 Pain in left hip: Secondary | ICD-10-CM | POA: Diagnosis not present

## 2021-05-05 DIAGNOSIS — R2681 Unsteadiness on feet: Secondary | ICD-10-CM | POA: Diagnosis not present

## 2021-05-05 DIAGNOSIS — M0600 Rheumatoid arthritis without rheumatoid factor, unspecified site: Secondary | ICD-10-CM | POA: Diagnosis not present

## 2021-05-05 DIAGNOSIS — N390 Urinary tract infection, site not specified: Secondary | ICD-10-CM | POA: Diagnosis not present

## 2021-05-05 DIAGNOSIS — I480 Paroxysmal atrial fibrillation: Secondary | ICD-10-CM | POA: Diagnosis not present

## 2021-05-18 ENCOUNTER — Ambulatory Visit
Admission: RE | Admit: 2021-05-18 | Discharge: 2021-05-18 | Disposition: A | Discharge status: Home / Self Care | Payer: PPO | Source: Ambulatory Visit | Attending: Hematology and Oncology | Admitting: Hematology and Oncology

## 2021-05-18 ENCOUNTER — Other Ambulatory Visit: Payer: Self-pay

## 2021-05-18 DIAGNOSIS — Z9889 Other specified postprocedural states: Secondary | ICD-10-CM

## 2021-05-18 DIAGNOSIS — R922 Inconclusive mammogram: Secondary | ICD-10-CM | POA: Diagnosis not present

## 2021-05-18 IMAGING — MG DIGITAL DIAGNOSTIC BILAT W/ TOMO W/ CAD
9 series · 9 of 25 positions shown · non-contrast
Comparison: Previous exam(s).

CLINICAL DATA: Left lumpectomy. Annual mammography. The patient was
recently in the hospital due to excess fluid. She is now on Lasix.

EXAM:
DIGITAL DIAGNOSTIC BILATERAL MAMMOGRAM WITH TOMOSYNTHESIS AND CAD
TECHNIQUE: Bilateral digital diagnostic mammography and breast tomosynthesis
was performed. The images were evaluated with computer-aided
detection.

[L CC]
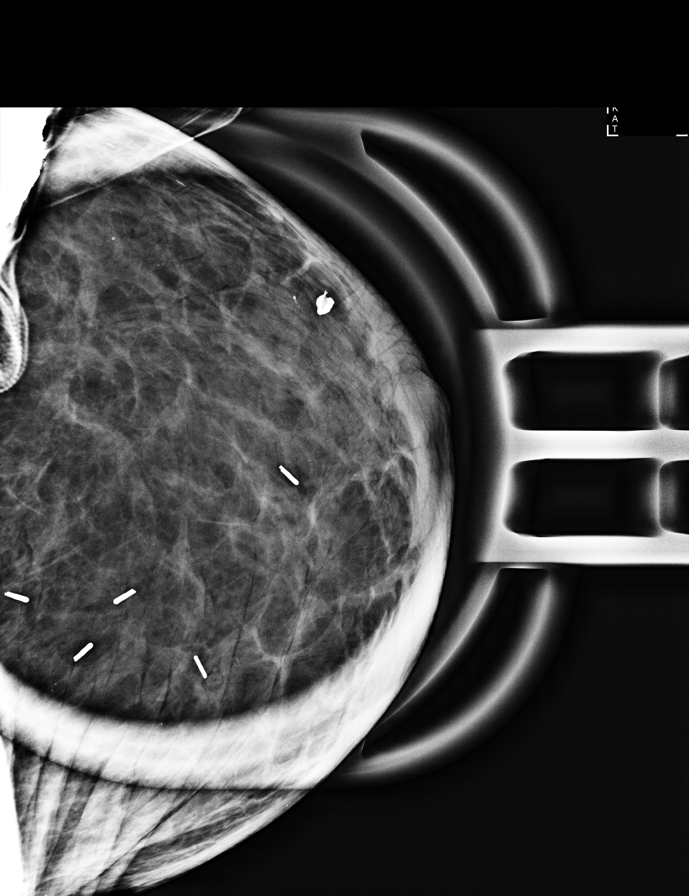

[R MLO synth-2D]
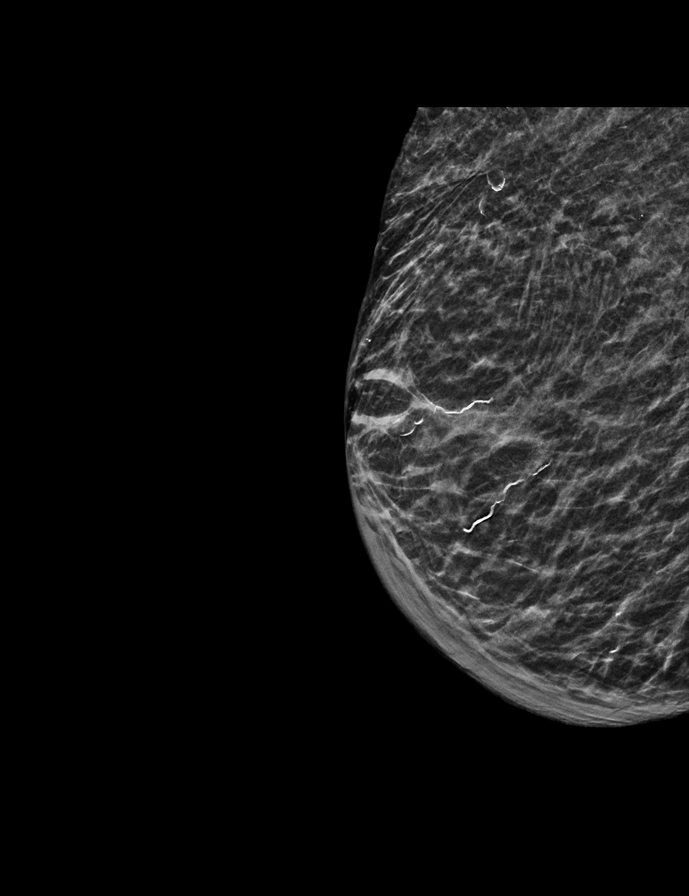

[L CC synth-2D]
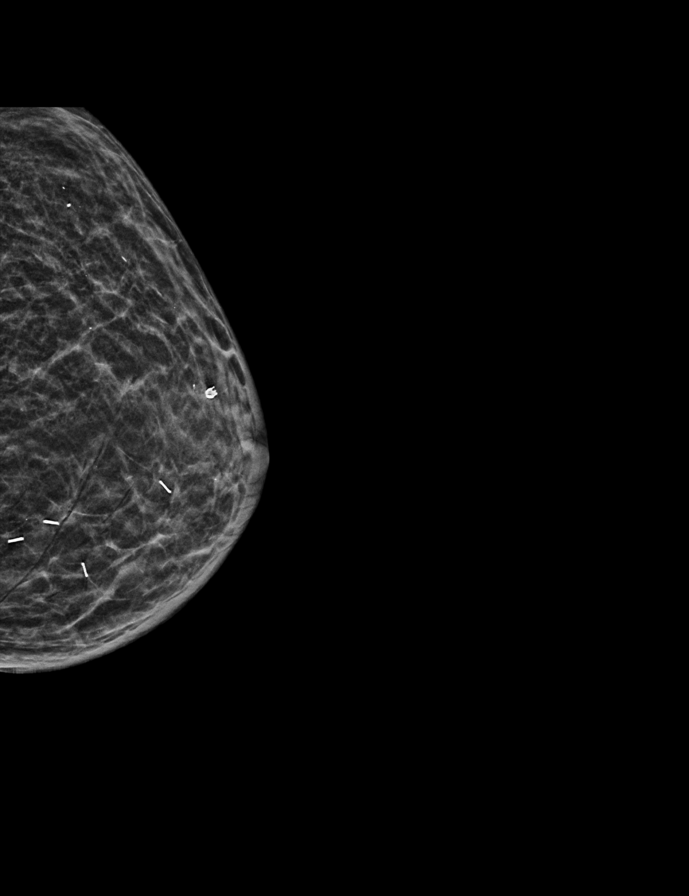

[R CC synth-2D]
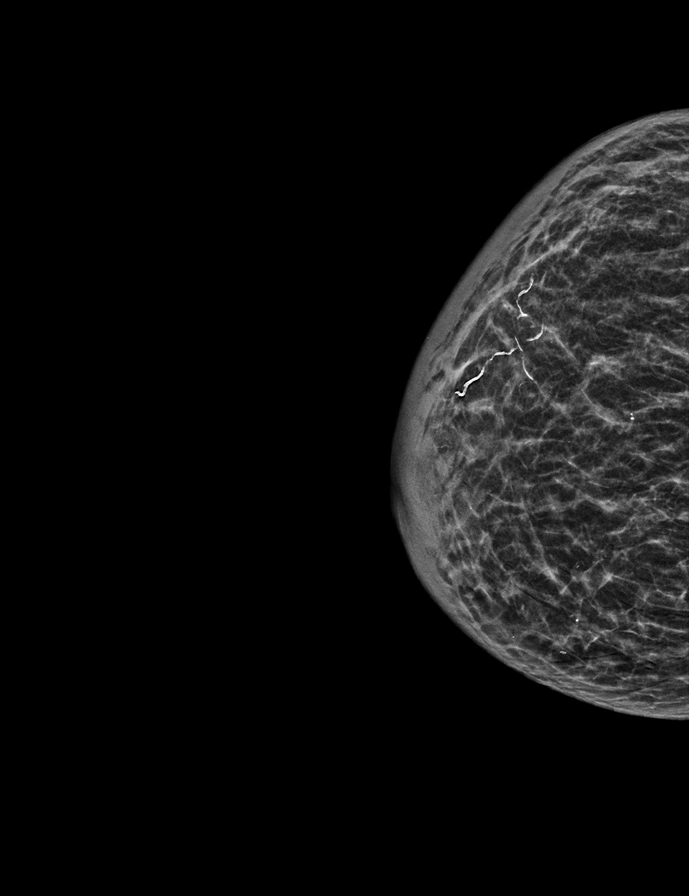

[L MLO synth-2D]
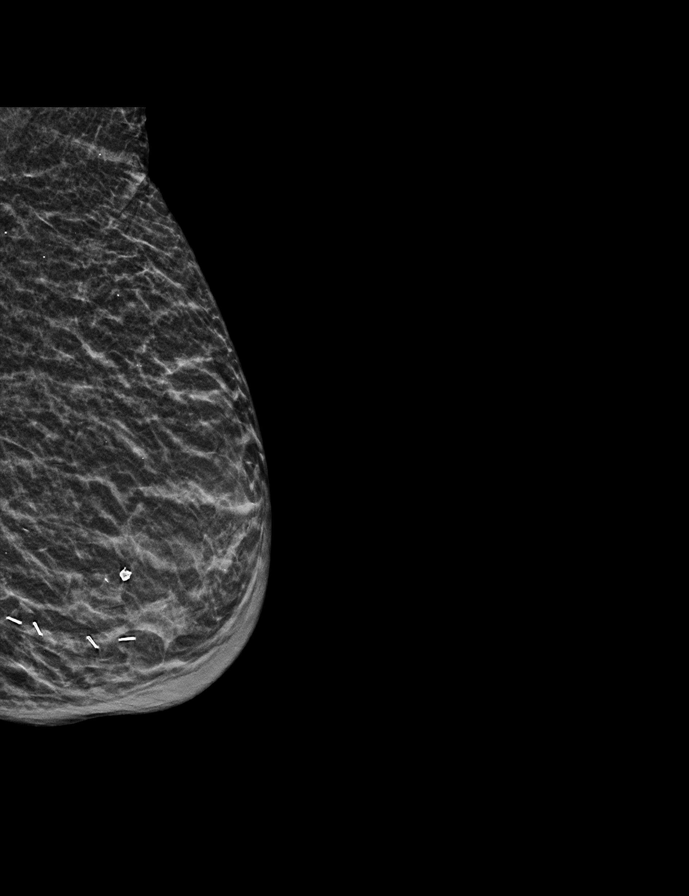

[L MLO tomo · tomo slice 14/27.0]
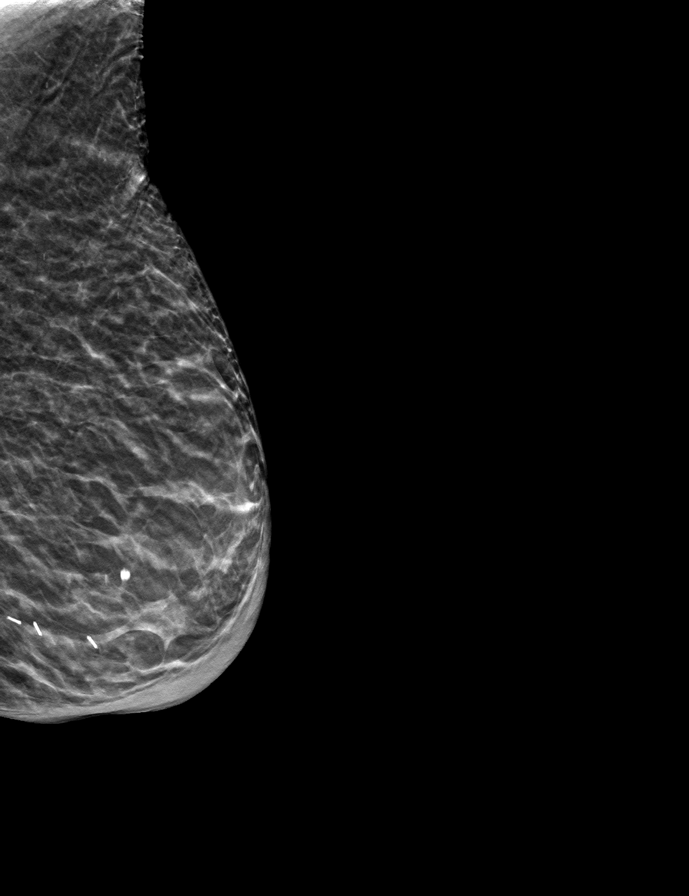

[R CC tomo · tomo slice 18/35.0]
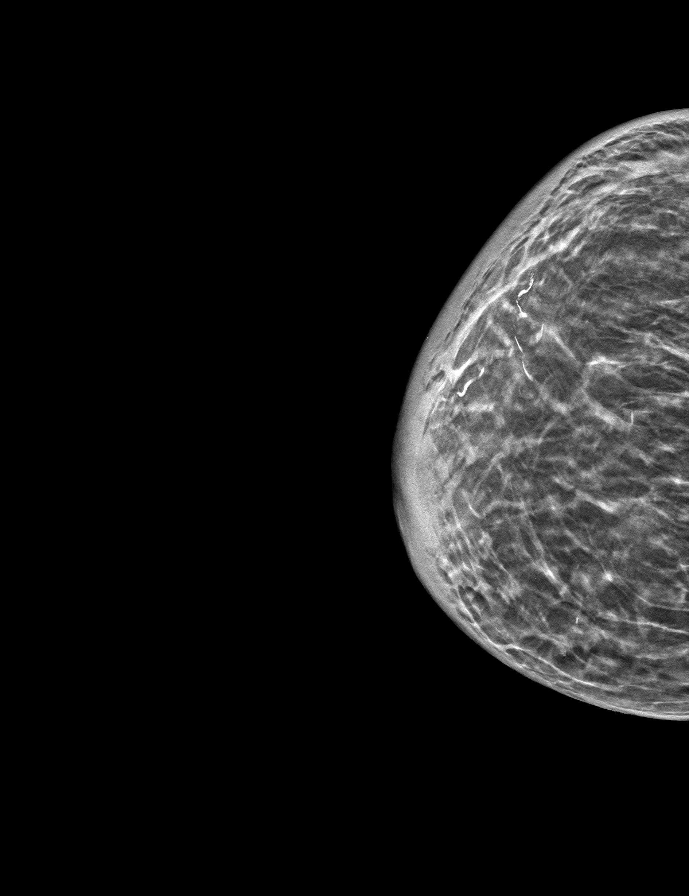

[R MLO tomo · tomo slice 19/37.0]
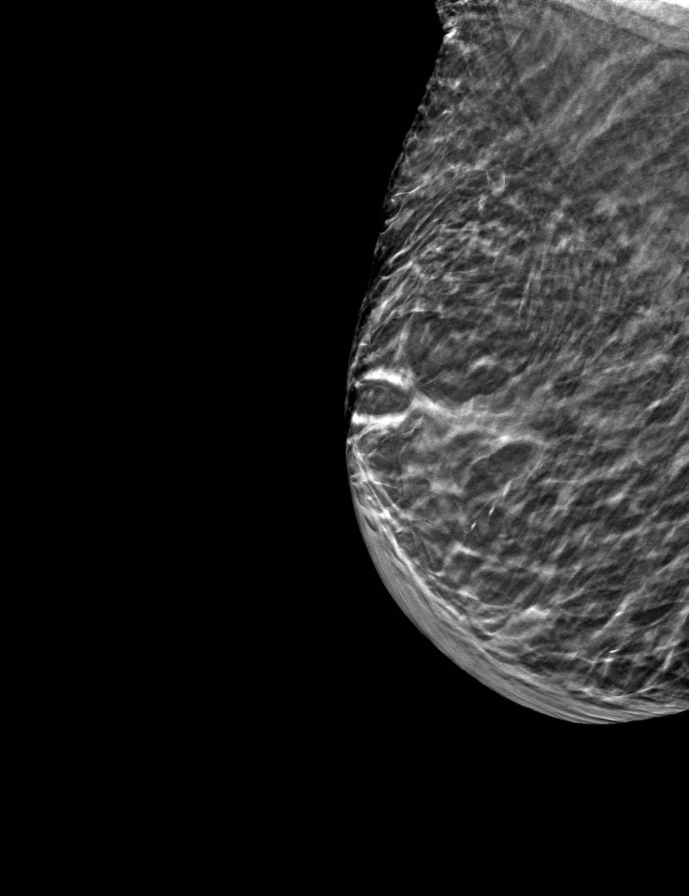

[L CC tomo · tomo slice 14/27.0]
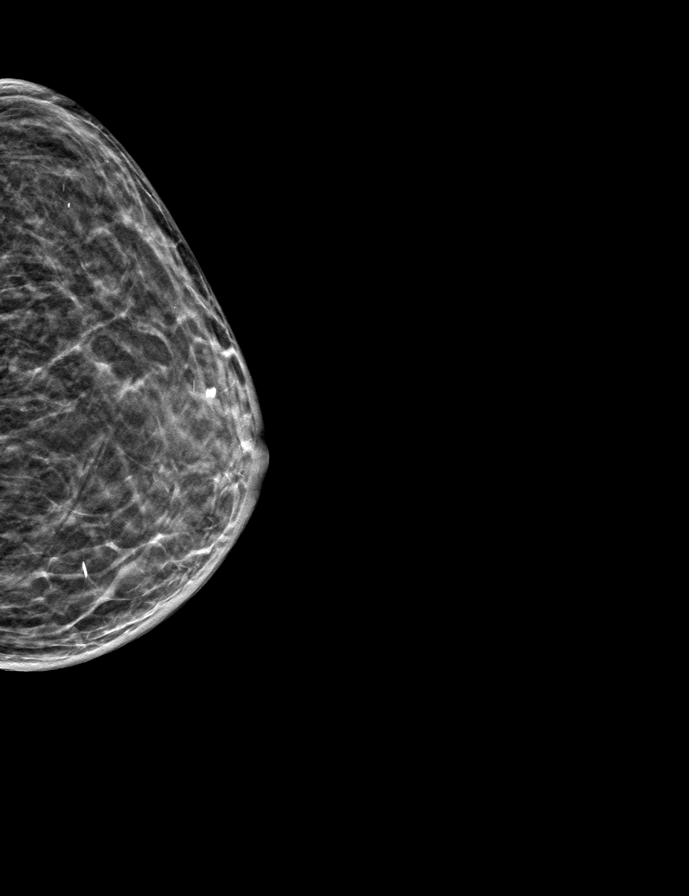

[9 of 25 positions shown; findings below may reference images not displayed]

ACR Breast Density Category c: The breast tissue is heterogeneously
dense, which may obscure small masses.
FINDINGS: The patient has lost significant weight in the interval. No
suspicious masses, calcifications, or distortion are identified.
Bilateral breast skin thickening is identified consistent with the
patient's history. No evidence of malignancy.
IMPRESSION: No evidence of malignancy.

RECOMMENDATION:
Per protocol, as the patient is now 2 or more years status post
lumpectomy, she may return to annual screening mammography in 1
year. However, given the history of breast cancer, the patient
remains eligible for annual diagnostic mammography if preferred.

I have discussed the findings and recommendations with the patient.
If applicable, a reminder letter will be sent to the patient
regarding the next appointment.

BI-RADS CATEGORY  2: Benign.

## 2021-05-19 DIAGNOSIS — S7002XA Contusion of left hip, initial encounter: Secondary | ICD-10-CM | POA: Diagnosis not present

## 2021-05-19 DIAGNOSIS — Z96642 Presence of left artificial hip joint: Secondary | ICD-10-CM | POA: Diagnosis not present

## 2021-05-19 DIAGNOSIS — M25552 Pain in left hip: Secondary | ICD-10-CM | POA: Diagnosis not present

## 2021-05-24 ENCOUNTER — Encounter: Payer: Self-pay | Admitting: Pharmacist

## 2021-05-24 NOTE — Progress Notes (Signed)
CARE PLAN ENTRY  05/24/2021 Name: Heidi Adams MRN: [id number] DOB: 09/17/1935  Heidi Adams is enrolled in Remote Patient Monitoring/Principle Care Monitoring.  Date of Enrollment: 08/13/20 Supervising physician: Adrian Prows Indication: CHF  Remote Readings:  Weight stable (113-116) and Compliant  Next scheduled OV: 06/23/21  Pharmacist Clinical Goal(s):  Over the next 90 days, patient will demonstrate Improved medication adherence as evidenced by medication fill history Over the next 90 days, patient will demonstrate improved understanding of prescribed medications and rationale for usage as evidenced by patient teach back Over the next 90 days, patient will experience decrease in ED visits. ED visits in last 6 months = 2 Over the next 90 days, patient will not experience hospital admission. Hospital Admissions in last 6 months = 2  Interventions: Provider and Inter-disciplinary care team collaboration (see longitudinal plan of care) Comprehensive medication review performed. Discussed plans with patient for ongoing care management follow up and provided patient with direct contact information for care management team Collaboration with provider re: medication management  Patient Self Care Activities:  Self administers medications as prescribed Attends all scheduled provider appointments Performs ADL's independently Performs IADL's independently  Allergies  Allergen Reactions   Codeine Nausea Only   Epinephrine     Tachcardyia, chest pains tremors   Tenormin [Atenolol] Rash   Outpatient Encounter Medications as of 05/24/2021  Medication Sig   acetaminophen (TYLENOL) 500 MG tablet Take 1,000 mg by mouth 3 (three) times daily as needed for moderate pain.    amiodarone (PACERONE) 200 MG tablet TAKE 1 TABLET BY MOUTH EVERY DAY (Patient taking differently: Take 100 mg by mouth daily.)   apixaban (ELIQUIS) 2.5 MG TABS tablet Take 1 tablet (2.5 mg total) by mouth 2 (two) times  daily.   furosemide (LASIX) 40 MG tablet Take 1 tablet (40 mg total) by mouth every other day. Take daily if weight up by 3 Lbs or shortness of breath (Patient taking differently: Take 40 mg by mouth every 3 (three) days.)   levothyroxine (SYNTHROID, LEVOTHROID) 112 MCG tablet Take 112-168 mcg by mouth daily before breakfast. Take 1 tablet (112 mcg) on Mondays, Tuesdays, Wednesdays, Thursdays, Fridays & Saturdays. Take 1.5 tablet (168 mcg) on Sundays   losartan (COZAAR) 50 MG tablet TAKE 1 TABLET BY MOUTH EACH NIGHT AT BEDTIME (Patient taking differently: Take 25 mg by mouth at bedtime.)   meclizine (ANTIVERT) 25 MG tablet Take 1 tablet (25 mg total) by mouth 3 (three) times daily.   metoprolol succinate (TOPROL-XL) 50 MG 24 hr tablet Take 25 mg by mouth daily. Take with or immediately following a meal.   pantoprazole (PROTONIX) 40 MG tablet Take 40 mg by mouth daily as needed (indigestion/heartburn.).    potassium chloride (KLOR-CON) 10 MEQ tablet Take 2 tablets (20 mEq total) by mouth every other day. Take every time with furosemide (Patient taking differently: Take 20 mEq by mouth every 3 (three) days. Taking every three days with furosemide)   No facility-administered encounter medications on file as of 05/24/2021.     Heart Failure   Type: Systolic  Last ejection fraction: 25-30% NYHA Class: III (marked limitation of activity) AHA HF Stage: C (Heart disease and symptoms present)  Patient is currently controlled on the following medications: metoprolol 50 mg, lasix 40 mg TIW, losartan 50 mg  We discussed diet and exercise extensively  Plan  Continue current medications and control with diet and exercise ______________ Visit Information SDOH (Social Determinants of Health) assessments performed: Yes.  Heidi Adams was given information about Principle Care Management/Remote Patient Monitoring services today including:  RPM/PCM service includes personalized support from designated  clinical staff supervised by her physician, including individualized plan of care and coordination with other care providers 24/7 contact phone numbers for assistance for urgent and routine care needs. Standard insurance, coinsurance, copays and deductibles apply for principle care management only during months in which we provide at least 30 minutes of these services. Most insurances cover these services at 100%, however patients may be responsible for any copay, coinsurance and/or deductible if applicable. This service may help you avoid the need for more expensive face-to-face services. Only one practitioner may furnish and bill the service in a calendar month. The patient may stop PCM/RPM services at any time (effective at the end of the month) by phone call to the office staff.  Patient agreed to services and verbal consent obtained.   Heidi Adams, Pharm.D. [facility] [phone] Ext: [phone]

## 2021-05-26 DIAGNOSIS — S7002XA Contusion of left hip, initial encounter: Secondary | ICD-10-CM | POA: Insufficient documentation

## 2021-05-28 DIAGNOSIS — I10 Essential (primary) hypertension: Secondary | ICD-10-CM | POA: Diagnosis not present

## 2021-05-28 DIAGNOSIS — I480 Paroxysmal atrial fibrillation: Secondary | ICD-10-CM | POA: Diagnosis not present

## 2021-05-28 DIAGNOSIS — I5032 Chronic diastolic (congestive) heart failure: Secondary | ICD-10-CM | POA: Diagnosis not present

## 2021-05-29 DIAGNOSIS — I110 Hypertensive heart disease with heart failure: Secondary | ICD-10-CM | POA: Diagnosis not present

## 2021-06-01 DIAGNOSIS — I951 Orthostatic hypotension: Secondary | ICD-10-CM | POA: Diagnosis not present

## 2021-06-01 DIAGNOSIS — I480 Paroxysmal atrial fibrillation: Secondary | ICD-10-CM | POA: Diagnosis not present

## 2021-06-01 DIAGNOSIS — E441 Mild protein-calorie malnutrition: Secondary | ICD-10-CM | POA: Diagnosis not present

## 2021-06-01 DIAGNOSIS — I110 Hypertensive heart disease with heart failure: Secondary | ICD-10-CM | POA: Diagnosis not present

## 2021-06-01 DIAGNOSIS — R634 Abnormal weight loss: Secondary | ICD-10-CM | POA: Diagnosis not present

## 2021-06-01 DIAGNOSIS — M0600 Rheumatoid arthritis without rheumatoid factor, unspecified site: Secondary | ICD-10-CM | POA: Diagnosis not present

## 2021-06-01 DIAGNOSIS — I700 Atherosclerosis of aorta: Secondary | ICD-10-CM | POA: Diagnosis not present

## 2021-06-01 DIAGNOSIS — I5042 Chronic combined systolic (congestive) and diastolic (congestive) heart failure: Secondary | ICD-10-CM | POA: Diagnosis not present

## 2021-06-01 DIAGNOSIS — C50919 Malignant neoplasm of unspecified site of unspecified female breast: Secondary | ICD-10-CM | POA: Diagnosis not present

## 2021-06-01 DIAGNOSIS — Z7901 Long term (current) use of anticoagulants: Secondary | ICD-10-CM | POA: Diagnosis not present

## 2021-06-01 DIAGNOSIS — Z0000 Encounter for general adult medical examination without abnormal findings: Secondary | ICD-10-CM | POA: Diagnosis not present

## 2021-06-01 DIAGNOSIS — D6869 Other thrombophilia: Secondary | ICD-10-CM | POA: Diagnosis not present

## 2021-06-02 DIAGNOSIS — I4891 Unspecified atrial fibrillation: Secondary | ICD-10-CM | POA: Diagnosis not present

## 2021-06-02 DIAGNOSIS — I5023 Acute on chronic systolic (congestive) heart failure: Secondary | ICD-10-CM | POA: Diagnosis not present

## 2021-06-02 DIAGNOSIS — Z79899 Other long term (current) drug therapy: Secondary | ICD-10-CM | POA: Diagnosis not present

## 2021-06-02 DIAGNOSIS — Z87440 Personal history of urinary (tract) infections: Secondary | ICD-10-CM | POA: Diagnosis not present

## 2021-06-02 DIAGNOSIS — Z7901 Long term (current) use of anticoagulants: Secondary | ICD-10-CM | POA: Diagnosis not present

## 2021-06-02 DIAGNOSIS — I110 Hypertensive heart disease with heart failure: Secondary | ICD-10-CM | POA: Diagnosis not present

## 2021-06-07 ENCOUNTER — Ambulatory Visit: Payer: PPO | Admitting: Student

## 2021-06-07 ENCOUNTER — Other Ambulatory Visit: Payer: Self-pay

## 2021-06-07 ENCOUNTER — Encounter: Payer: Self-pay | Admitting: Student

## 2021-06-07 VITALS — BP 175/89 | HR 68 | Temp 97.5°F | Ht 64.0 in | Wt 119.4 lb

## 2021-06-07 DIAGNOSIS — R0602 Shortness of breath: Secondary | ICD-10-CM | POA: Diagnosis not present

## 2021-06-07 DIAGNOSIS — I951 Orthostatic hypotension: Secondary | ICD-10-CM | POA: Diagnosis not present

## 2021-06-07 DIAGNOSIS — I5022 Chronic systolic (congestive) heart failure: Secondary | ICD-10-CM | POA: Diagnosis not present

## 2021-06-07 NOTE — Progress Notes (Signed)
Primary Physician/Referring:  [name], MD  Patient ID: Heidi Adams, female    DOB: [birth year], 85 y.o.   MRN: [id number]  No chief complaint on file.  HPI:    Heidi Adams  is a 85 y.o. female  with bilateral hip replacement and knee replacement and left breast lumpectomy in 2018 without chemotherapy or radiation therapy for breast cancer and also remote hysterectomy due to uterine cancer in 1991, supine hypertension and orthostatic hypotension, paroxysmal atrial fibrillation on chronic Eliquis, chronic dizziness on standing orthostatic hypotension. Has not been able to tolerate any BP medications due to severe dizzy spells.  She also has history of A. fib with RVR SP direct-current cardioversion due to acute decompensated heart failure with severely systolic dysfunction on 11/27/3873 and also GI bleed at the time.  She is now tolerating Eliquis.    Patient is enrolled in remote patient weight monitoring.  Home weights reveal patient's weight is stable (113-116).   Patient presents for urgent visit at her request.  At last office visit patient was stable and no changes were made, however did discuss CODE STATUS and patient is now DNR.  Patient is concerned today because for the last 2 days she has noticed some mild dyspnea on exertion.  Denies orthopnea, leg edema, PND.  Patient typically takes Lasix every 2 to 3 days, however she has not taken Lasix in the last 3 to 4 days, stating "she was too busy".  Patient's weight is mildly elevated in the office today compared to baseline, up from 116 to 119 pounds.  Patient also expresses concern she feels metoprolol is making her feel fatigued.  Patient has expressed concern regarding fatigue related to the medications and multiple prior office visits.  She is currently taking metoprolol in the mornings and losartan in the evenings.  Past Medical History:  Diagnosis Date   Allergy    Anemia    hx of anemia   Arthritis    Breast cancer ([place])     Left breast   Cancer ([place])    skin ca nose   Chronic combined systolic and diastolic CHF (congestive heart failure) ([place]) 09/22/2018   Chronic combined systolic and diastolic CHF (congestive heart failure) ([place]) 09/22/2018   Chronic combined systolic and diastolic CHF (congestive heart failure) ([place]) 09/22/2018   Dysrhythmia    went into Atrial Fibrillation after last surgery, HAS PALPITATIONS   History of skin cancer    Hypertension    Hypothyroidism    PVC (premature ventricular contraction)    Thyroiditis 1973   Past Surgical History:  Procedure Laterality Date   ABDOMINAL HYSTERECTOMY  90   TAH BSO   APPENDECTOMY     BREAST LUMPECTOMY Left 05/30/2017   BREAST LUMPECTOMY WITH RADIOACTIVE SEED LOCALIZATION Left 05/30/2017   Procedure: LEFT BREAST LUMPECTOMY WITH RADIOACTIVE SEED LOCALIZATION;  Surgeon: Excell Seltzer, MD;  Location: [facility];  Service: General;  Laterality: Left;   CATARACT EXTRACTION  09/10/2018   Right eye   CHOLECYSTECTOMY     dr Georgia Lopes   ELBOW SURGERY     EYE SURGERY     "on my eyelids" years ago   KNEE ARTHROSCOPY     left   THYROIDECTOMY  1973   TONSILLECTOMY     TOTAL HIP ARTHROPLASTY Right 05/10/2016   Procedure: TOTAL HIP ARTHROPLASTY ANTERIOR APPROACH;  Surgeon: Paralee Cancel, MD;  Location: [facility];  Service: Orthopedics;  Laterality: Right;   TOTAL HIP ARTHROPLASTY Left 01/10/2017  Procedure: LEFT TOTAL HIP ARTHROPLASTY ANTERIOR APPROACH;  Surgeon: Paralee Cancel, MD;  Location: [facility];  Service: Orthopedics;  Laterality: Left;   TOTAL KNEE ARTHROPLASTY Left 09/28/2015   Procedure: LEFT TOTAL KNEE ARTHROPLASTY;  Surgeon: Paralee Cancel, MD;  Location: [facility];  Service: Orthopedics;  Laterality: Left;   TOTAL KNEE ARTHROPLASTY Right 08/09/2016   Procedure: RIGHT TOTAL KNEE ARTHROPLASTY;  Surgeon: Paralee Cancel, MD;  Location: [facility];  Service: Orthopedics;  Laterality: Right;  Adductor Block   Total Left hip arthroplasty     01/10/17 Dr.  Alvan Dame   Family History  Problem Relation Age of Onset   Diabetes Father    Hypertension Father    Stroke Father    Breast cancer Cousin    Pneumonia Sister     Social History   Tobacco Use   Smoking status: Former    Packs/day: 1.00    Years: 20.00    Pack years: 20.00    Types: Cigarettes    Quit date: 07/26/1987    Years since quitting: 33.8   Smokeless tobacco: Never  Substance Use Topics   Alcohol use: No    Alcohol/week: 0.0 standard drinks  Marital Status: Married   ROS    Review of Systems  Constitutional: Negative for diaphoresis and weight gain.  Cardiovascular:  Positive for dyspnea on exertion (mild) and leg swelling (intermittent, stable and mild). Negative for chest pain, orthopnea, palpitations and syncope.  Musculoskeletal:  Positive for arthritis and joint pain. Negative for joint swelling.  Neurological:  Negative for light-headedness.   Objective  Blood pressure (!) 175/89, pulse 68, temperature (!) 97.5 F (36.4 C), height 5\' 4"  (1.626 m), weight 119 lb 6.4 oz (54.2 kg), last menstrual period 07/25/1988, SpO2 97 %.  Vitals with BMI 06/07/2021 06/07/2021 04/27/2021  Height - 5\' 4"  -  Weight - 119 lbs 6 oz -  BMI - 44.81 -  Systolic 856 314 970  Diastolic 89 93 82  Pulse 68 68 69    No data found.  Physical Exam Vitals reviewed.  Constitutional:      General: She is not in acute distress.    Appearance: She is ill-appearing.     Comments: Moderately build, frail  Neck:     Vascular: No carotid bruit or JVD.  Cardiovascular:     Rate and Rhythm: Normal rate and regular rhythm.     Pulses: Intact distal pulses.          Radial pulses are 2+ on the right side and 2+ on the left side.       Dorsalis pedis pulses are 1+ on the right side and 1+ on the left side.       Posterior tibial pulses are 1+ on the right side and 1+ on the left side.     Heart sounds: S1 normal and S2 normal. Murmur heard.  High-pitched blowing holosystolic murmur is  present with a grade of 2/6 at the apex.  High-pitched blowing decrescendo early diastolic murmur is present with a grade of 3/4 at the upper right sternal border radiating to the apex.    No gallop.  Pulmonary:     Effort: No accessory muscle usage or respiratory distress.     Breath sounds: Examination of the right-lower field reveals rhonchi. Examination of the left-lower field reveals rhonchi. Rhonchi (bibasilar coarse crackles, right worse, at the bases) present.  Musculoskeletal:        General: Swelling (trace edema) present.  Physical exam  unchanged compared to previous office visit.  Laboratory examination:   Recent Labs    08/17/20 1026 09/09/20 1319 10/28/20 1034 02/07/21 0357 02/08/21 0340 04/25/21 1404 04/25/21 1421  NA 139 143   < > 139 139 136 139  K 3.4* 4.2   < > 3.1* 4.2 3.7 3.5  CL 96 104   < > 99 101 104 105  CO2 28 23   < > 31 30  --  23  GLUCOSE 89 81   < > 87 96 101* 103*  BUN 28* 22   < > 29* 32* 27* 29*  CREATININE 1.56* 0.78   < > 1.14* 1.26* 1.10* 1.12*  CALCIUM 9.1 9.2   < > 8.4* 8.9  --  9.0  GFRNONAA 31* 71   < > 48* 43*  --  49*  GFRAA 35* 82  --   --   --   --   --    < > = values in this interval not displayed.   CrCl cannot be calculated (Patient's most recent lab result is older than the maximum 21 days allowed.).  CMP Latest Ref Rng & Units 04/25/2021 04/25/2021 02/08/2021  Glucose 70 - 99 mg/dL 103(H) 101(H) 96  BUN 8 - 23 mg/dL 29(H) 27(H) 32(H)  Creatinine 0.44 - 1.00 mg/dL 1.12(H) 1.10(H) 1.26(H)  Sodium 135 - 145 mmol/L 139 136 139  Potassium 3.5 - 5.1 mmol/L 3.5 3.7 4.2  Chloride 98 - 111 mmol/L 105 104 101  CO2 22 - 32 mmol/L 23 - 30  Calcium 8.9 - 10.3 mg/dL 9.0 - 8.9  Total Protein 6.5 - 8.1 g/dL 6.3(L) - -  Total Bilirubin 0.3 - 1.2 mg/dL 0.8 - -  Alkaline Phos 38 - 126 U/L 54 - -  AST 15 - 41 U/L 14(L) - -  ALT 0 - 44 U/L 10 - -   CBC Latest Ref Rng & Units 04/26/2021 04/25/2021 04/25/2021  WBC 4.0 - 10.5 K/uL 9.7 13.7(H) -   Hemoglobin 12.0 - 15.0 g/dL 10.3(L) 12.4 12.9  Hematocrit 36.0 - 46.0 % 32.0(L) 39.6 38.0  Platelets 150 - 400 K/uL 140(L) 190 -   Lipid Panel     Component Value Date/Time   CHOL 142 07/31/2018 0655   TRIG 72 07/31/2018 0655   HDL 45 07/31/2018 0655   CHOLHDL 3.2 07/31/2018 0655   VLDL 14 07/31/2018 0655   LDLCALC 83 07/31/2018 0655   HEMOGLOBIN A1C Lab Results  Component Value Date   HGBA1C 5.0 07/31/2018   MPG 96.8 07/31/2018   TSH Recent Labs    01/26/21 1028  TSH 17.100*   BNP (last 3 results) Recent Labs    08/17/20 1026 09/09/20 1320 02/06/21 0828  BNP 327.1* 1,136.5* 1,741.5*   External labs:   Lipid Panel completed 04/09/2019 HDL 38 MG/DL 04/09/2019 LDL 80.000 mg 04/09/2019 Cholesterol, total 143.000 m 04/09/2019 Triglycerides 127.000 04/09/2019  A1C 5.000 07/31/2018  Glucose Random 110.000 m 11/04/2019 MicroAlbumin Urine 9.000 04/25/2019 MicroAlbumin/Creat 16.8 MG/ 04/25/2019 BUN 18.000 mg 11/04/2019 Creatinine, Serum 0.700 mg/ 11/04/2019  TSH 2.620 micr 11/04/2019  Allergies   Allergies  Allergen Reactions   Codeine Nausea Only   Epinephrine     Tachcardyia, chest pains tremors   Tenormin [Atenolol] Rash    Medications Prior to Visit:   Outpatient Medications Prior to Visit  Medication Sig Dispense Refill   acetaminophen (TYLENOL) 500 MG tablet Take 1,000 mg by mouth 3 (three) times daily as needed for moderate pain.  amiodarone (PACERONE) 200 MG tablet TAKE 1 TABLET BY MOUTH EVERY DAY (Patient taking differently: Take 100 mg by mouth daily.) 90 tablet 1   apixaban (ELIQUIS) 2.5 MG TABS tablet Take 1 tablet (2.5 mg total) by mouth 2 (two) times daily. 60 tablet 1   furosemide (LASIX) 40 MG tablet Take 1 tablet (40 mg total) by mouth every other day. Take daily if weight up by 3 Lbs or shortness of breath (Patient taking differently: Take 40 mg by mouth every 3 (three) days.) 90 tablet 1   levothyroxine (SYNTHROID, LEVOTHROID) 112 MCG tablet  Take 112-168 mcg by mouth daily before breakfast. Take 1 tablet (112 mcg) on Mondays, Tuesdays, Wednesdays, Thursdays, Fridays & Saturdays. Take 1.5 tablet (168 mcg) on Sundays     losartan (COZAAR) 50 MG tablet TAKE 1 TABLET BY MOUTH EACH NIGHT AT BEDTIME (Patient taking differently: Take 25 mg by mouth at bedtime.) 90 tablet 1   meclizine (ANTIVERT) 25 MG tablet Take 1 tablet (25 mg total) by mouth 3 (three) times daily. 30 tablet 0   metoprolol succinate (TOPROL-XL) 50 MG 24 hr tablet Take 25 mg by mouth daily. Take with or immediately following a meal.     pantoprazole (PROTONIX) 40 MG tablet Take 40 mg by mouth daily as needed (indigestion/heartburn.).      potassium chloride (KLOR-CON) 10 MEQ tablet Take 2 tablets (20 mEq total) by mouth every other day. Take every time with furosemide (Patient taking differently: Take 20 mEq by mouth every 3 (three) days. Taking every three days with furosemide) 180 tablet 1   No facility-administered medications prior to visit.   Final Medications at End of Visit    No outpatient medications have been marked as taking for the 06/07/21 encounter (Office Visit) with Rayetta Pigg, Kalena Mander C, PA-C.    Radiology:   No results found.  Cardiac Studies:    Lexiscan Myoview stress test  04/23/2018: 1. The resting electrocardiogram demonstrated normal sinus rhythm, normal resting conduction and no resting arrhythmias.  Stress EKG is non-diagnostic for ischemia as it a pharmacologic stress using Lexiscan. Occasional PAC and PVC. The patient developed significant symptoms which included Chest Pain, Stomach pain, Headache.  2. The overall quality of the study is good. There is no evidence of abnormal lung activity. Stress and rest SPECT images demonstrate homogeneous tracer distribution throughout the myocardium. Gated SPECT imaging reveals diffuse global hypokinesis. The left ventricular ejection fraction was markedly depressed at (26%).   3. This is a high risk study  due to severe LV systolic dysfunction. Findings may represent non ischemic cardiomyopathy.  PCV ECHOCARDIOGRAM COMPLETE 03/04/2021 Left ventricle cavity is normal in size. Moderate concentric hypertrophy of the left ventricle. Severe global hypokinesis. LV systolic function with visual EF 25-30%. Diastolic function not assessed due to severity of valvular regurgitation. Left atrial cavity is severely dilated. Structurally normal trileaflet aortic valve. No evidence of aortic stenosis. Moderate (Grade III) aortic regurgitation. Moderate (Grade III) mitral regurgitation. Moderate tricuspid regurgitation. Moderate pulmonic regurgitation. No evidence of pulmonary hypertension. No significant change compared to previous study on 07/23/2020.   EKG   EKG 03/18/2021: Normal sinus rhythm at rate of 71 bpm, left axis deviation, left anterior fascicular block.  Poor R progression, cannot exclude anteroseptal infarct old.  LVH by voltage in aVL.  Nonspecific T abnormality.  No significant change from 07/14/2020.   EKG 03/25/2020: Atrial fibrillation with rapid ventricular response at the rate of 140 bpm, left axis deviation, left intrafascicular block.  Poor  R wave progression, cannot exclude anteroseptal infarct old.  Nonspecific ST-T abnormality, cannot exclude lateral ischemia.   Assessment     ICD-10-CM   1. Chronic systolic heart failure (HCC)  I50.22     2. Orthostatic hypotension  I95.1     3. Shortness of breath  R06.02       CHA2DS2-VASc Score is 5.  Yearly risk of stroke: 7.2% (A, F, HTN, CHF).  Score of 1=0.6; 2=2.2; 3=3.2; 4=4.8; 5=7.2; 6=9.8; 7=>9.8) -(CHF; HTN; vasc disease DM,  Female = 1; Age <65 =0; 65-74 = 1,  >75 =2; stroke/embolism= 2).   No orders of the defined types were placed in this encounter.   There are no discontinued medications.   No orders of the defined types were placed in this encounter.   Recommendations:   Heidi Adams  is a 85 y.o. female  with  bilateral hip replacement and knee replacement and left breast lumpectomy in 2018 without chemotherapy or radiation therapy for breast cancer and also remote hysterectomy due to uterine cancer in 1991, supine hypertension and orthostatic hypotension, paroxysmal atrial fibrillation on chronic Eliquis, chronic dizziness on standing orthostatic hypotension. Has not been able to tolerate any BP medications due to severe dizzy spells.  She also has history of A. fib with RVR SP direct-current cardioversion due to acute decompensated heart failure with severely systolic dysfunction on 12/25/1306 and also GI bleed at the time.  She is now tolerating Eliquis.    Patient is enrolled in remote patient weight monitoring.  Home weights reveal patient's weight is stable (113-116).   Patient presents for urgent visit at her request.  At last office visit patient was stable and no changes were made, however did discuss CODE STATUS and patient is now DNR.  Suspect patient's dyspnea on exertion may be to to underlying volume overload.  Advised her to take Lasix 40 mg once daily for the next 3 days, then reduce to every 2 to 3 days as she was tolerating previously.  Given concern of fatigue with metoprolol advised patient to take metoprolol in the evenings and switch losartan to the morning, patient is agreeable. However counseled patient that her fatigue is also likely due to underlying chronic medical comorbidities.  She continues to tolerate Eliquis without bleeding diathesis.  Will not make changes to patient's medications at this time.  On exam today there is no clinical evidence of acute decompensated heart failure.  Follow-up in 3 months, sooner if needed.    Alethia Berthold, PA-C 06/07/2021, 12:09 PM Office: [phone]

## 2021-06-08 DIAGNOSIS — J189 Pneumonia, unspecified organism: Secondary | ICD-10-CM | POA: Diagnosis not present

## 2021-06-08 DIAGNOSIS — Z20822 Contact with and (suspected) exposure to covid-19: Secondary | ICD-10-CM | POA: Diagnosis not present

## 2021-06-08 DIAGNOSIS — R0602 Shortness of breath: Secondary | ICD-10-CM | POA: Diagnosis not present

## 2021-06-16 ENCOUNTER — Ambulatory Visit: Payer: PPO | Admitting: Neurology

## 2021-06-22 NOTE — Progress Notes (Signed)
Primary Physician/Referring:  [name], MD  Patient ID: Heidi Adams, female    DOB: 06/20/1936, 85 y.o.   MRN: [id number]  Chief Complaint  Patient presents with   Atrial Fibrillation   Follow-up    HPI:    Heidi Adams  is a 85 y.o. female  with bilateral hip replacement and knee replacement and left breast lumpectomy in 2018 without chemotherapy or radiation therapy for breast cancer and also remote hysterectomy due to uterine cancer in 1991, supine hypertension and orthostatic hypotension, paroxysmal atrial fibrillation on chronic Eliquis, chronic dizziness on standing orthostatic hypotension. Has not been able to tolerate any BP medications due to severe dizzy spells.  She also has history of A. fib with RVR SP direct-current cardioversion due to acute decompensated heart failure with severely systolic dysfunction on 0/08/6376 and also GI bleed at the time.  She is now tolerating Eliquis.    Patient is enrolled in remote patient weight monitoring.  Home weights reveal patient's weight is stable (113-116).   Patient was seen 2 weeks ago for urgent visit with complaints of dyspnea on exertion.  At that time she had not taken Lasix in the last 4 days and her weight had trended up from baseline 219 pounds.  At last office visit advised patient to take Lasix for 3 days straight, then resume taking it every 2 to 3 days, which patient has done.  Weight has trended back to baseline today and symptoms of dyspnea have improved.  Past Medical History:  Diagnosis Date   Allergy    Anemia    hx of anemia   Arthritis    Breast cancer ([place])    Left breast   Cancer ([place])    skin ca nose   Chronic combined systolic and diastolic CHF (congestive heart failure) ([place]) 09/22/2018   Chronic combined systolic and diastolic CHF (congestive heart failure) ([place]) 09/22/2018   Chronic combined systolic and diastolic CHF (congestive heart failure) ([place]) 09/22/2018   Dysrhythmia    went into Atrial  Fibrillation after last surgery, HAS PALPITATIONS   History of skin cancer    Hypertension    Hypothyroidism    PVC (premature ventricular contraction)    Thyroiditis 1973   Past Surgical History:  Procedure Laterality Date   ABDOMINAL HYSTERECTOMY  90   TAH BSO   APPENDECTOMY     BREAST LUMPECTOMY Left 05/30/2017   BREAST LUMPECTOMY WITH RADIOACTIVE SEED LOCALIZATION Left 05/30/2017   Procedure: LEFT BREAST LUMPECTOMY WITH RADIOACTIVE SEED LOCALIZATION;  Surgeon: Excell Seltzer, MD;  Location: [facility];  Service: General;  Laterality: Left;   CATARACT EXTRACTION  09/10/2018   Right eye   CHOLECYSTECTOMY     dr Georgia Lopes   ELBOW SURGERY     EYE SURGERY     "on my eyelids" years ago   KNEE ARTHROSCOPY     left   THYROIDECTOMY  1973   TONSILLECTOMY     TOTAL HIP ARTHROPLASTY Right 05/10/2016   Procedure: TOTAL HIP ARTHROPLASTY ANTERIOR APPROACH;  Surgeon: Paralee Cancel, MD;  Location: [facility];  Service: Orthopedics;  Laterality: Right;   TOTAL HIP ARTHROPLASTY Left 01/10/2017   Procedure: LEFT TOTAL HIP ARTHROPLASTY ANTERIOR APPROACH;  Surgeon: Paralee Cancel, MD;  Location: [facility];  Service: Orthopedics;  Laterality: Left;   TOTAL KNEE ARTHROPLASTY Left 09/28/2015   Procedure: LEFT TOTAL KNEE ARTHROPLASTY;  Surgeon: Paralee Cancel, MD;  Location: [facility];  Service: Orthopedics;  Laterality: Left;   TOTAL  KNEE ARTHROPLASTY Right 08/09/2016   Procedure: RIGHT TOTAL KNEE ARTHROPLASTY;  Surgeon: Paralee Cancel, MD;  Location: [facility];  Service: Orthopedics;  Laterality: Right;  Adductor Block   Total Left hip arthroplasty     01/10/17 Dr. Alvan Dame   Family History  Problem Relation Age of Onset   Diabetes Father    Hypertension Father    Stroke Father    Breast cancer Cousin    Pneumonia Sister     Social History   Tobacco Use   Smoking status: Former    Packs/day: 1.00    Years: 20.00    Pack years: 20.00    Types: Cigarettes    Quit date: 07/26/1987    Years since  quitting: 33.9   Smokeless tobacco: Never  Substance Use Topics   Alcohol use: No    Alcohol/week: 0.0 standard drinks  Marital Status: Married   ROS    Review of Systems  Constitutional: Positive for weight loss (back to baseline). Negative for diaphoresis and weight gain.  Cardiovascular:  Positive for dyspnea on exertion (improved) and leg swelling (intermittent, stable and mild). Negative for chest pain, orthopnea, palpitations and syncope.  Musculoskeletal:  Positive for arthritis and joint pain. Negative for joint swelling.  Neurological:  Negative for light-headedness.   Objective  Blood pressure (!) 173/92, pulse 68, temperature 97.6 F (36.4 C), height 5\' 4"  (1.626 m), weight 117 lb (53.1 kg), last menstrual period 07/25/1988, SpO2 93 %.  Vitals with BMI 06/23/2021 06/23/2021 06/07/2021  Height - 5\' 4"  -  Weight - 117 lbs -  BMI - 47.09 -  Systolic 628 366 294  Diastolic 92 88 89  Pulse 68 68 68    Orthostatic VS for the past 72 hrs (Last 3 readings):  Orthostatic BP Patient Position BP Location Cuff Size Orthostatic Pulse  06/23/21 0934 180/84 Standing Left Arm Normal 68  06/23/21 0932 183/86 Sitting Left Arm Normal 68  06/23/21 0930 (!) 188/92 Supine Left Arm Normal 69    Physical Exam Vitals reviewed.  Constitutional:      General: She is not in acute distress.    Appearance: She is ill-appearing.     Comments: Moderately build, frail  Neck:     Vascular: No carotid bruit or JVD.  Cardiovascular:     Rate and Rhythm: Normal rate and regular rhythm.     Pulses: Intact distal pulses.          Radial pulses are 2+ on the right side and 2+ on the left side.       Dorsalis pedis pulses are 1+ on the right side and 1+ on the left side.       Posterior tibial pulses are 1+ on the right side and 1+ on the left side.     Heart sounds: S1 normal and S2 normal. Murmur heard.  High-pitched blowing holosystolic murmur is present with a grade of 2/6 at the apex.   High-pitched blowing decrescendo early diastolic murmur is present with a grade of 3/4 at the upper right sternal border radiating to the apex.    No gallop.  Pulmonary:     Effort: No accessory muscle usage or respiratory distress.     Breath sounds: Examination of the right-lower field reveals rhonchi. Examination of the left-lower field reveals rhonchi. Rhonchi (bibasilar coarse crackles, right worse, at the bases) present.  Musculoskeletal:     Right lower leg: No edema.     Left lower leg: No edema.  Laboratory examination:   Recent Labs    08/17/20 1026 09/09/20 1319 10/28/20 1034 02/07/21 0357 02/08/21 0340 04/25/21 1404 04/25/21 1421  NA 139 143   < > 139 139 136 139  K 3.4* 4.2   < > 3.1* 4.2 3.7 3.5  CL 96 104   < > 99 101 104 105  CO2 28 23   < > 31 30  --  23  GLUCOSE 89 81   < > 87 96 101* 103*  BUN 28* 22   < > 29* 32* 27* 29*  CREATININE 1.56* 0.78   < > 1.14* 1.26* 1.10* 1.12*  CALCIUM 9.1 9.2   < > 8.4* 8.9  --  9.0  GFRNONAA 31* 71   < > 48* 43*  --  49*  GFRAA 35* 82  --   --   --   --   --    < > = values in this interval not displayed.   CrCl cannot be calculated (Patient's most recent lab result is older than the maximum 21 days allowed.).  CMP Latest Ref Rng & Units 04/25/2021 04/25/2021 02/08/2021  Glucose 70 - 99 mg/dL 103(H) 101(H) 96  BUN 8 - 23 mg/dL 29(H) 27(H) 32(H)  Creatinine 0.44 - 1.00 mg/dL 1.12(H) 1.10(H) 1.26(H)  Sodium 135 - 145 mmol/L 139 136 139  Potassium 3.5 - 5.1 mmol/L 3.5 3.7 4.2  Chloride 98 - 111 mmol/L 105 104 101  CO2 22 - 32 mmol/L 23 - 30  Calcium 8.9 - 10.3 mg/dL 9.0 - 8.9  Total Protein 6.5 - 8.1 g/dL 6.3(L) - -  Total Bilirubin 0.3 - 1.2 mg/dL 0.8 - -  Alkaline Phos 38 - 126 U/L 54 - -  AST 15 - 41 U/L 14(L) - -  ALT 0 - 44 U/L 10 - -   CBC Latest Ref Rng & Units 04/26/2021 04/25/2021 04/25/2021  WBC 4.0 - 10.5 K/uL 9.7 13.7(H) -  Hemoglobin 12.0 - 15.0 g/dL 10.3(L) 12.4 12.9  Hematocrit 36.0 - 46.0 % 32.0(L) 39.6  38.0  Platelets 150 - 400 K/uL 140(L) 190 -   Lipid Panel     Component Value Date/Time   CHOL 142 07/31/2018 0655   TRIG 72 07/31/2018 0655   HDL 45 07/31/2018 0655   CHOLHDL 3.2 07/31/2018 0655   VLDL 14 07/31/2018 0655   LDLCALC 83 07/31/2018 0655   HEMOGLOBIN A1C Lab Results  Component Value Date   HGBA1C 5.0 07/31/2018   MPG 96.8 07/31/2018   TSH Recent Labs    01/26/21 1028  TSH 17.100*   BNP (last 3 results) Recent Labs    08/17/20 1026 09/09/20 1320 02/06/21 0828  BNP 327.1* 1,136.5* 1,741.5*   External labs:   Lipid Panel completed 04/09/2019 HDL 38 MG/DL 04/09/2019 LDL 80.000 mg 04/09/2019 Cholesterol, total 143.000 m 04/09/2019 Triglycerides 127.000 04/09/2019  A1C 5.000 07/31/2018  Glucose Random 110.000 m 11/04/2019 MicroAlbumin Urine 9.000 04/25/2019 MicroAlbumin/Creat 16.8 MG/ 04/25/2019 BUN 18.000 mg 11/04/2019 Creatinine, Serum 0.700 mg/ 11/04/2019  TSH 2.620 micr 11/04/2019  Allergies   Allergies  Allergen Reactions   Codeine Nausea Only   Epinephrine     Tachcardyia, chest pains tremors   Tenormin [Atenolol] Rash    Medications Prior to Visit:   Outpatient Medications Prior to Visit  Medication Sig Dispense Refill   acetaminophen (TYLENOL) 500 MG tablet Take 1,000 mg by mouth 3 (three) times daily as needed for moderate pain.      albuterol (VENTOLIN  HFA) 108 (90 Base) MCG/ACT inhaler SMARTSIG:1 Puff(s) Via Inhaler Every 6 Hours PRN     amiodarone (PACERONE) 200 MG tablet TAKE 1 TABLET BY MOUTH EVERY DAY (Patient taking differently: Take 100 mg by mouth daily.) 90 tablet 1   apixaban (ELIQUIS) 2.5 MG TABS tablet Take 1 tablet (2.5 mg total) by mouth 2 (two) times daily. 60 tablet 1   levothyroxine (SYNTHROID, LEVOTHROID) 112 MCG tablet Take 112-168 mcg by mouth daily before breakfast. Take 1 tablet (112 mcg) on Mondays, Tuesdays, Wednesdays, Thursdays, Fridays & Saturdays. Take 1.5 tablet (168 mcg) on Sundays     losartan (COZAAR) 50 MG  tablet TAKE 1 TABLET BY MOUTH EACH NIGHT AT BEDTIME (Patient taking differently: Take 25 mg by mouth at bedtime.) 90 tablet 1   metoprolol succinate (TOPROL-XL) 50 MG 24 hr tablet Take 25 mg by mouth daily. Take with or immediately following a meal.     pantoprazole (PROTONIX) 40 MG tablet Take 40 mg by mouth daily as needed (indigestion/heartburn.).      potassium chloride (KLOR-CON) 10 MEQ tablet Take 2 tablets (20 mEq total) by mouth every other day. Take every time with furosemide (Patient taking differently: Take 20 mEq by mouth every 3 (three) days. Taking every three days with furosemide) 180 tablet 1   furosemide (LASIX) 40 MG tablet Take 1 tablet (40 mg total) by mouth every other day. Take daily if weight up by 3 Lbs or shortness of breath (Patient taking differently: Take 40 mg by mouth every 3 (three) days.) 90 tablet 1   meclizine (ANTIVERT) 25 MG tablet Take 1 tablet (25 mg total) by mouth 3 (three) times daily. (Patient not taking: Reported on 06/23/2021) 30 tablet 0   No facility-administered medications prior to visit.   Final Medications at End of Visit    Current Meds  Medication Sig   acetaminophen (TYLENOL) 500 MG tablet Take 1,000 mg by mouth 3 (three) times daily as needed for moderate pain.    albuterol (VENTOLIN HFA) 108 (90 Base) MCG/ACT inhaler SMARTSIG:1 Puff(s) Via Inhaler Every 6 Hours PRN   amiodarone (PACERONE) 200 MG tablet TAKE 1 TABLET BY MOUTH EVERY DAY (Patient taking differently: Take 100 mg by mouth daily.)   apixaban (ELIQUIS) 2.5 MG TABS tablet Take 1 tablet (2.5 mg total) by mouth 2 (two) times daily.   levothyroxine (SYNTHROID, LEVOTHROID) 112 MCG tablet Take 112-168 mcg by mouth daily before breakfast. Take 1 tablet (112 mcg) on Mondays, Tuesdays, Wednesdays, Thursdays, Fridays & Saturdays. Take 1.5 tablet (168 mcg) on Sundays   losartan (COZAAR) 50 MG tablet TAKE 1 TABLET BY MOUTH EACH NIGHT AT BEDTIME (Patient taking differently: Take 25 mg by mouth at  bedtime.)   metoprolol succinate (TOPROL-XL) 50 MG 24 hr tablet Take 25 mg by mouth daily. Take with or immediately following a meal.   pantoprazole (PROTONIX) 40 MG tablet Take 40 mg by mouth daily as needed (indigestion/heartburn.).    potassium chloride (KLOR-CON) 10 MEQ tablet Take 2 tablets (20 mEq total) by mouth every other day. Take every time with furosemide (Patient taking differently: Take 20 mEq by mouth every 3 (three) days. Taking every three days with furosemide)   [DISCONTINUED] furosemide (LASIX) 40 MG tablet Take 1 tablet (40 mg total) by mouth every other day. Take daily if weight up by 3 Lbs or shortness of breath (Patient taking differently: Take 40 mg by mouth every 3 (three) days.)    Radiology:   No results found.  Cardiac Studies:    Lexiscan Myoview stress test  04/23/2018: 1. The resting electrocardiogram demonstrated normal sinus rhythm, normal resting conduction and no resting arrhythmias.  Stress EKG is non-diagnostic for ischemia as it a pharmacologic stress using Lexiscan. Occasional PAC and PVC. The patient developed significant symptoms which included Chest Pain, Stomach pain, Headache.  2. The overall quality of the study is good. There is no evidence of abnormal lung activity. Stress and rest SPECT images demonstrate homogeneous tracer distribution throughout the myocardium. Gated SPECT imaging reveals diffuse global hypokinesis. The left ventricular ejection fraction was markedly depressed at (26%).   3. This is a high risk study due to severe LV systolic dysfunction. Findings may represent non ischemic cardiomyopathy.  PCV ECHOCARDIOGRAM COMPLETE 03/04/2021 Left ventricle cavity is normal in size. Moderate concentric hypertrophy of the left ventricle. Severe global hypokinesis. LV systolic function with visual EF 25-30%. Diastolic function not assessed due to severity of valvular regurgitation. Left atrial cavity is severely dilated. Structurally normal  trileaflet aortic valve. No evidence of aortic stenosis. Moderate (Grade III) aortic regurgitation. Moderate (Grade III) mitral regurgitation. Moderate tricuspid regurgitation. Moderate pulmonic regurgitation. No evidence of pulmonary hypertension. No significant change compared to previous study on 07/23/2020.   EKG  06/23/2021: Sinus rhythm at a rate of 69 bpm.  Left axis, left anterior fascicular block.  LVH by voltage criteria.  Poor R wave progression, cannot exclude anteroseptal infarct old.  EKG 03/18/2021: Normal sinus rhythm at rate of 71 bpm, left axis deviation, left anterior fascicular block.  Poor R progression, cannot exclude anteroseptal infarct old.  LVH by voltage in aVL.  Nonspecific T abnormality.  No significant change from 07/14/2020.   EKG 03/25/2020: Atrial fibrillation with rapid ventricular response at the rate of 140 bpm, left axis deviation, left intrafascicular block.  Poor R wave progression, cannot exclude anteroseptal infarct old.  Nonspecific ST-T abnormality, cannot exclude lateral ischemia.   Assessment     ICD-10-CM   1. AF (paroxysmal atrial fibrillation) (HCC)  I48.0 EKG 12-Lead    2. Chronic systolic heart failure (HCC)  I50.22 furosemide (LASIX) 40 MG tablet      CHA2DS2-VASc Score is 5.  Yearly risk of stroke: 7.2% (A, F, HTN, CHF).  Score of 1=0.6; 2=2.2; 3=3.2; 4=4.8; 5=7.2; 6=9.8; 7=>9.8) -(CHF; HTN; vasc disease DM,  Female = 1; Age <65 =0; 65-74 = 1,  >75 =2; stroke/embolism= 2).   Meds ordered this encounter  Medications   furosemide (LASIX) 40 MG tablet    Sig: Take daily if weight up by 3 Lbs or shortness of breath or swelling. Take 40 mg once per day 2 times per week.    Dispense:  90 tablet    Refill:  1     Medications Discontinued During This Encounter  Medication Reason   furosemide (LASIX) 40 MG tablet      Orders Placed This Encounter  Procedures   EKG 12-Lead     Recommendations:   Heidi Adams  is a 85 y.o. female   with bilateral hip replacement and knee replacement and left breast lumpectomy in 2018 without chemotherapy or radiation therapy for breast cancer and also remote hysterectomy due to uterine cancer in 1991, supine hypertension and orthostatic hypotension, paroxysmal atrial fibrillation on chronic Eliquis, chronic dizziness on standing orthostatic hypotension. Has not been able to tolerate any BP medications due to severe dizzy spells.  She also has history of A. fib with RVR SP direct-current cardioversion due to acute decompensated  heart failure with severely systolic dysfunction on 0/12/2692 and also GI bleed at the time.  She is now tolerating Eliquis.   Patient presents for 2-week follow-up after urgent visit with dyspnea on exertion and weight gain.  Dyspnea has improved and weight has returned to baseline with use of Lasix 40 mg once daily.  There is no clinical evidence of acute decompensated heart failure at today's office visit.  Patient's blood pressure remains elevated, however given history of orthostatic hypotension will hold off on making changes at this time.  Patient is quite resistant to medication changes and as she is relatively stable without recent hospitalizations do not feel changes are needed at this time.  Advised patient to continue to take Lasix 40 mg once daily 2 to 3 days/week with additional doses as needed.  Patient verbalized understanding and agreement with the plan.  Follow-up in 3 months, sooner if needed.  Patient prefers to follow-up with Dr. [name].   Alethia Berthold, PA-C 06/23/2021, 11:28 AM Office: [phone]

## 2021-06-23 ENCOUNTER — Ambulatory Visit: Payer: PPO | Admitting: Student

## 2021-06-23 ENCOUNTER — Other Ambulatory Visit: Payer: Self-pay

## 2021-06-23 ENCOUNTER — Encounter: Payer: Self-pay | Admitting: Student

## 2021-06-23 VITALS — BP 173/92 | HR 68 | Temp 97.6°F | Ht 64.0 in | Wt 117.0 lb

## 2021-06-23 DIAGNOSIS — I5022 Chronic systolic (congestive) heart failure: Secondary | ICD-10-CM | POA: Diagnosis not present

## 2021-06-23 DIAGNOSIS — I480 Paroxysmal atrial fibrillation: Secondary | ICD-10-CM

## 2021-06-23 MED ORDER — FUROSEMIDE 40 MG PO TABS: ORAL_TABLET | ORAL | 1 refills | Status: DC

## 2021-06-23 MED ORDER — APIXABAN 2.5 MG PO TABS: 2.5000 mg | ORAL_TABLET | Freq: Two times a day (BID) | ORAL | 3 refills | Status: DC

## 2021-06-27 DIAGNOSIS — I480 Paroxysmal atrial fibrillation: Secondary | ICD-10-CM | POA: Diagnosis not present

## 2021-06-27 DIAGNOSIS — I5032 Chronic diastolic (congestive) heart failure: Secondary | ICD-10-CM | POA: Diagnosis not present

## 2021-06-27 DIAGNOSIS — I10 Essential (primary) hypertension: Secondary | ICD-10-CM | POA: Diagnosis not present

## 2021-07-06 ENCOUNTER — Ambulatory Visit: Payer: PPO | Admitting: Neurology

## 2021-07-09 DIAGNOSIS — J1000 Influenza due to other identified influenza virus with unspecified type of pneumonia: Secondary | ICD-10-CM | POA: Diagnosis not present

## 2021-07-12 ENCOUNTER — Other Ambulatory Visit: Payer: Self-pay

## 2021-07-12 ENCOUNTER — Inpatient Hospital Stay (HOSPITAL_COMMUNITY)
Admission: EM | Admit: 2021-07-12 | Discharge: 2021-07-15 | DRG: 193 | Disposition: A | Discharge status: Home Health | Payer: PPO | Attending: Internal Medicine | Admitting: Internal Medicine

## 2021-07-12 ENCOUNTER — Encounter (HOSPITAL_COMMUNITY): Payer: Self-pay

## 2021-07-12 ENCOUNTER — Emergency Department (HOSPITAL_COMMUNITY): Payer: PPO

## 2021-07-12 DIAGNOSIS — Z823 Family history of stroke: Secondary | ICD-10-CM | POA: Diagnosis not present

## 2021-07-12 DIAGNOSIS — R059 Cough, unspecified: Secondary | ICD-10-CM | POA: Diagnosis not present

## 2021-07-12 DIAGNOSIS — Z853 Personal history of malignant neoplasm of breast: Secondary | ICD-10-CM | POA: Diagnosis not present

## 2021-07-12 DIAGNOSIS — D638 Anemia in other chronic diseases classified elsewhere: Secondary | ICD-10-CM | POA: Diagnosis present

## 2021-07-12 DIAGNOSIS — Z79899 Other long term (current) drug therapy: Secondary | ICD-10-CM | POA: Diagnosis not present

## 2021-07-12 DIAGNOSIS — Z20822 Contact with and (suspected) exposure to covid-19: Secondary | ICD-10-CM | POA: Diagnosis present

## 2021-07-12 DIAGNOSIS — Z885 Allergy status to narcotic agent status: Secondary | ICD-10-CM

## 2021-07-12 DIAGNOSIS — I5023 Acute on chronic systolic (congestive) heart failure: Secondary | ICD-10-CM | POA: Diagnosis present

## 2021-07-12 DIAGNOSIS — Z888 Allergy status to other drugs, medicaments and biological substances status: Secondary | ICD-10-CM | POA: Diagnosis not present

## 2021-07-12 DIAGNOSIS — J441 Chronic obstructive pulmonary disease with (acute) exacerbation: Secondary | ICD-10-CM | POA: Diagnosis not present

## 2021-07-12 DIAGNOSIS — I480 Paroxysmal atrial fibrillation: Secondary | ICD-10-CM | POA: Diagnosis present

## 2021-07-12 DIAGNOSIS — I509 Heart failure, unspecified: Secondary | ICD-10-CM

## 2021-07-12 DIAGNOSIS — E039 Hypothyroidism, unspecified: Secondary | ICD-10-CM | POA: Diagnosis present

## 2021-07-12 DIAGNOSIS — Z8249 Family history of ischemic heart disease and other diseases of the circulatory system: Secondary | ICD-10-CM

## 2021-07-12 DIAGNOSIS — Z87891 Personal history of nicotine dependence: Secondary | ICD-10-CM

## 2021-07-12 DIAGNOSIS — Z833 Family history of diabetes mellitus: Secondary | ICD-10-CM | POA: Diagnosis not present

## 2021-07-12 DIAGNOSIS — J189 Pneumonia, unspecified organism: Secondary | ICD-10-CM | POA: Diagnosis not present

## 2021-07-12 DIAGNOSIS — R0600 Dyspnea, unspecified: Secondary | ICD-10-CM | POA: Diagnosis not present

## 2021-07-12 DIAGNOSIS — K219 Gastro-esophageal reflux disease without esophagitis: Secondary | ICD-10-CM | POA: Diagnosis not present

## 2021-07-12 DIAGNOSIS — I110 Hypertensive heart disease with heart failure: Secondary | ICD-10-CM | POA: Diagnosis present

## 2021-07-12 DIAGNOSIS — Z66 Do not resuscitate: Secondary | ICD-10-CM | POA: Diagnosis present

## 2021-07-12 DIAGNOSIS — D649 Anemia, unspecified: Secondary | ICD-10-CM | POA: Diagnosis not present

## 2021-07-12 DIAGNOSIS — Z96643 Presence of artificial hip joint, bilateral: Secondary | ICD-10-CM | POA: Diagnosis present

## 2021-07-12 DIAGNOSIS — Z96653 Presence of artificial knee joint, bilateral: Secondary | ICD-10-CM | POA: Diagnosis present

## 2021-07-12 DIAGNOSIS — Z803 Family history of malignant neoplasm of breast: Secondary | ICD-10-CM | POA: Diagnosis not present

## 2021-07-12 DIAGNOSIS — J101 Influenza due to other identified influenza virus with other respiratory manifestations: Secondary | ICD-10-CM | POA: Diagnosis not present

## 2021-07-12 DIAGNOSIS — Z7901 Long term (current) use of anticoagulants: Secondary | ICD-10-CM | POA: Diagnosis not present

## 2021-07-12 DIAGNOSIS — Z79890 Hormone replacement therapy (postmenopausal): Secondary | ICD-10-CM | POA: Diagnosis not present

## 2021-07-12 DIAGNOSIS — R0602 Shortness of breath: Secondary | ICD-10-CM | POA: Diagnosis not present

## 2021-07-12 DIAGNOSIS — Z8673 Personal history of transient ischemic attack (TIA), and cerebral infarction without residual deficits: Secondary | ICD-10-CM

## 2021-07-12 DIAGNOSIS — I517 Cardiomegaly: Secondary | ICD-10-CM | POA: Diagnosis not present

## 2021-07-12 DIAGNOSIS — I5043 Acute on chronic combined systolic (congestive) and diastolic (congestive) heart failure: Secondary | ICD-10-CM | POA: Diagnosis present

## 2021-07-12 LAB — CBC
HCT: 36.3 % (ref 36.0–46.0)
Hemoglobin: 11.1 g/dL — ABNORMAL LOW (ref 12.0–15.0)
MCH: 28.5 pg (ref 26.0–34.0)
MCHC: 30.6 g/dL (ref 30.0–36.0)
MCV: 93.3 fL (ref 80.0–100.0)
Platelets: 250 10*3/uL (ref 150–400)
RBC: 3.89 MIL/uL (ref 3.87–5.11)
RDW: 16.1 % — ABNORMAL HIGH (ref 11.5–15.5)
WBC: 7.2 10*3/uL (ref 4.0–10.5)
nRBC: 0 % (ref 0.0–0.2)

## 2021-07-12 LAB — GLUCOSE, CAPILLARY: Glucose-Capillary: 122 mg/dL — ABNORMAL HIGH (ref 70–99)

## 2021-07-12 LAB — BASIC METABOLIC PANEL
Anion gap: 9 (ref 5–15)
BUN: 21 mg/dL (ref 8–23)
CO2: 22 mmol/L (ref 22–32)
Calcium: 8.2 mg/dL — ABNORMAL LOW (ref 8.9–10.3)
Chloride: 108 mmol/L (ref 98–111)
Creatinine, Ser: 0.83 mg/dL (ref 0.44–1.00)
GFR, Estimated: >60 mL/min (ref >60)
Glucose, Bld: 91 mg/dL (ref 70–99)
Potassium: 4 mmol/L (ref 3.5–5.1)
Sodium: 139 mmol/L (ref 135–145)

## 2021-07-12 LAB — RESP PANEL BY RT-PCR (FLU A&B, COVID) ARPGX2
Influenza A by PCR: POSITIVE — AB
Influenza B by PCR: NEGATIVE
SARS Coronavirus 2 by RT PCR: NEGATIVE

## 2021-07-12 LAB — BRAIN NATRIURETIC PEPTIDE: B Natriuretic Peptide: 2073.8 pg/mL — ABNORMAL HIGH (ref 0.0–100.0)

## 2021-07-12 LAB — PROCALCITONIN: Procalcitonin: <0.1 ng/mL

## 2021-07-12 IMAGING — DX DG CHEST 1V PORT
1 series · 1 of 1 positions shown · non-contrast
Comparison: 02/06/2021

CLINICAL DATA: Cough, shortness of breath

EXAM:
PORTABLE CHEST 1 VIEW

[chest ap]
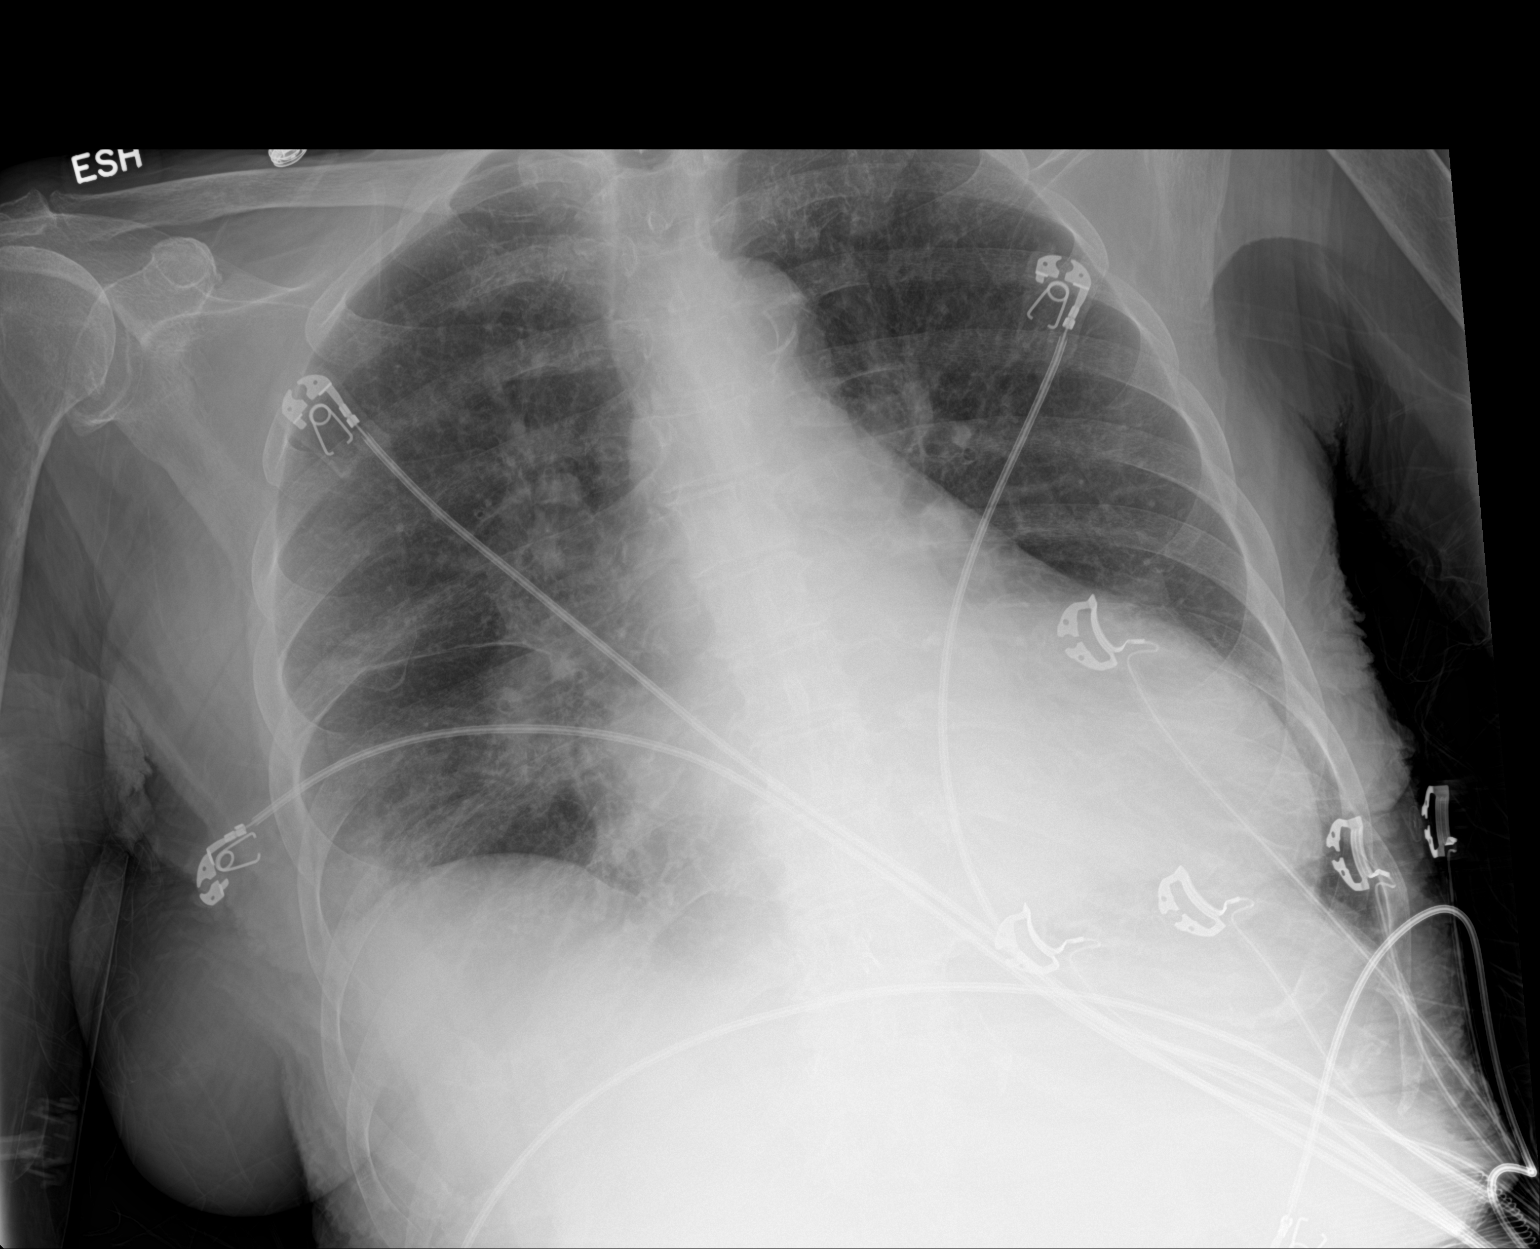

[1 of 1 positions shown; findings below may reference images not displayed]

FINDINGS: Cardiomegaly with vascular congestion. Patchy opacities in the right
mid and lower lung. No focal opacity on the left. No overt edema. No
effusions or acute bony abnormality.
IMPRESSION: Cardiomegaly, vascular congestion.

Patchy opacities in the right mid and lower lung concerning for
pneumonia.

## 2021-07-12 MED ORDER — FUROSEMIDE 10 MG/ML IJ SOLN
40.0000 mg | Freq: Once | INTRAMUSCULAR | Status: AC
  Administered 2021-07-12: 10:00:00 40 mg via INTRAVENOUS
  Filled 2021-07-12: qty 4

## 2021-07-12 MED ORDER — LEVOFLOXACIN 250 MG PO TABS
250.0000 mg | ORAL_TABLET | Freq: Every day | ORAL | Status: DC
  Administered 2021-07-13: 11:00:00 250 mg via ORAL
  Filled 2021-07-12: qty 1

## 2021-07-12 MED ORDER — PANTOPRAZOLE SODIUM 40 MG PO TBEC: 40.0000 mg | DELAYED_RELEASE_TABLET | Freq: Every day | ORAL | Status: DC | PRN

## 2021-07-12 MED ORDER — METHYLPREDNISOLONE SODIUM SUCC 40 MG IJ SOLR
40.0000 mg | Freq: Once | INTRAMUSCULAR | Status: AC
  Administered 2021-07-12: 16:00:00 40 mg via INTRAVENOUS
  Filled 2021-07-12: qty 1

## 2021-07-12 MED ORDER — PREDNISONE 20 MG PO TABS
40.0000 mg | ORAL_TABLET | Freq: Every day | ORAL | Status: DC
  Administered 2021-07-13: 11:00:00 40 mg via ORAL
  Filled 2021-07-12: qty 2

## 2021-07-12 MED ORDER — ACETAMINOPHEN 650 MG RE SUPP: 650.0000 mg | Freq: Four times a day (QID) | RECTAL | Status: DC | PRN

## 2021-07-12 MED ORDER — LEVOTHYROXINE SODIUM 112 MCG PO TABS: 112.0000 ug | ORAL_TABLET | Freq: Every day | ORAL | Status: DC

## 2021-07-12 MED ORDER — LEVOTHYROXINE SODIUM 112 MCG PO TABS
168.0000 ug | ORAL_TABLET | ORAL | Status: DC
  Administered 2021-07-14: 06:00:00 168 ug via ORAL
  Filled 2021-07-12 (×2): qty 2

## 2021-07-12 MED ORDER — ONDANSETRON HCL 4 MG/2ML IJ SOLN: 4.0000 mg | Freq: Four times a day (QID) | INTRAMUSCULAR | Status: DC | PRN

## 2021-07-12 MED ORDER — POTASSIUM CHLORIDE CRYS ER 20 MEQ PO TBCR: 20.0000 meq | EXTENDED_RELEASE_TABLET | ORAL | Status: DC

## 2021-07-12 MED ORDER — SODIUM CHLORIDE 0.9 % IV SOLN
500.0000 mg | Freq: Once | INTRAVENOUS | Status: AC
  Administered 2021-07-12: 12:00:00 500 mg via INTRAVENOUS
  Filled 2021-07-12: qty 5

## 2021-07-12 MED ORDER — AMIODARONE HCL 100 MG PO TABS
100.0000 mg | ORAL_TABLET | Freq: Every day | ORAL | Status: DC
  Administered 2021-07-12 – 2021-07-15 (×4): 100 mg via ORAL
  Filled 2021-07-12 (×4): qty 1

## 2021-07-12 MED ORDER — SODIUM CHLORIDE 0.9 % IV SOLN
1.0000 g | Freq: Once | INTRAVENOUS | Status: AC
  Administered 2021-07-12: 12:00:00 1 g via INTRAVENOUS
  Filled 2021-07-12: qty 10

## 2021-07-12 MED ORDER — OSELTAMIVIR PHOSPHATE 30 MG PO CAPS
30.0000 mg | ORAL_CAPSULE | Freq: Two times a day (BID) | ORAL | Status: AC
  Administered 2021-07-12 – 2021-07-14 (×4): 30 mg via ORAL
  Filled 2021-07-12 (×4): qty 1

## 2021-07-12 MED ORDER — APIXABAN 2.5 MG PO TABS
2.5000 mg | ORAL_TABLET | Freq: Two times a day (BID) | ORAL | Status: DC
  Administered 2021-07-12 – 2021-07-15 (×6): 2.5 mg via ORAL
  Filled 2021-07-12 (×6): qty 1

## 2021-07-12 MED ORDER — LOSARTAN POTASSIUM 50 MG PO TABS
25.0000 mg | ORAL_TABLET | Freq: Every day | ORAL | Status: DC
  Administered 2021-07-12 – 2021-07-14 (×3): 25 mg via ORAL
  Filled 2021-07-12 (×3): qty 1

## 2021-07-12 MED ORDER — IPRATROPIUM-ALBUTEROL 0.5-2.5 (3) MG/3ML IN SOLN
3.0000 mL | Freq: Once | RESPIRATORY_TRACT | Status: AC
  Administered 2021-07-12: 16:00:00 3 mL via RESPIRATORY_TRACT
  Filled 2021-07-12: qty 3

## 2021-07-12 MED ORDER — OSELTAMIVIR PHOSPHATE 75 MG PO CAPS
75.0000 mg | ORAL_CAPSULE | Freq: Once | ORAL | Status: DC
  Filled 2021-07-12: qty 1

## 2021-07-12 MED ORDER — METOPROLOL SUCCINATE ER 25 MG PO TB24
25.0000 mg | ORAL_TABLET | Freq: Every day | ORAL | Status: DC
  Administered 2021-07-13 – 2021-07-15 (×3): 25 mg via ORAL
  Filled 2021-07-12 (×3): qty 1

## 2021-07-12 MED ORDER — POLYVINYL ALCOHOL 1.4 % OP SOLN
1.0000 [drp] | Freq: Every day | OPHTHALMIC | Status: DC | PRN
  Filled 2021-07-12: qty 15

## 2021-07-12 MED ORDER — AEROCHAMBER Z-STAT PLUS/MEDIUM MISC
1.0000 | Freq: Once | Status: AC
  Administered 2021-07-12: 10:00:00 1
  Filled 2021-07-12: qty 1

## 2021-07-12 MED ORDER — ALBUTEROL SULFATE HFA 108 (90 BASE) MCG/ACT IN AERS
2.0000 | INHALATION_SPRAY | RESPIRATORY_TRACT | Status: DC | PRN
  Administered 2021-07-12: 10:00:00 2 via RESPIRATORY_TRACT
  Filled 2021-07-12: qty 6.7

## 2021-07-12 MED ORDER — FUROSEMIDE 10 MG/ML IJ SOLN
20.0000 mg | Freq: Every day | INTRAMUSCULAR | Status: DC
  Administered 2021-07-13: 10:00:00 20 mg via INTRAVENOUS
  Filled 2021-07-12: qty 2

## 2021-07-12 MED ORDER — ACETAMINOPHEN 325 MG PO TABS
650.0000 mg | ORAL_TABLET | Freq: Four times a day (QID) | ORAL | Status: DC | PRN
  Administered 2021-07-14: 15:00:00 650 mg via ORAL
  Filled 2021-07-12: qty 2

## 2021-07-12 MED ORDER — LEVOTHYROXINE SODIUM 112 MCG PO TABS
112.0000 ug | ORAL_TABLET | ORAL | Status: DC
  Administered 2021-07-13 – 2021-07-15 (×2): 112 ug via ORAL
  Filled 2021-07-12 (×3): qty 1

## 2021-07-12 MED ORDER — ONDANSETRON HCL 4 MG PO TABS: 4.0000 mg | ORAL_TABLET | Freq: Four times a day (QID) | ORAL | Status: DC | PRN

## 2021-07-12 MED ORDER — MAGNESIUM SULFATE 2 GM/50ML IV SOLN
2.0000 g | Freq: Once | INTRAVENOUS | Status: AC
  Administered 2021-07-12: 16:00:00 2 g via INTRAVENOUS
  Filled 2021-07-12: qty 50

## 2021-07-12 NOTE — ED Notes (Signed)
Pt was just dx with the Flu A on Friday

## 2021-07-12 NOTE — Plan of Care (Signed)
  Problem: Education: Goal: Knowledge of General Education information will improve Description Including pain rating scale, medication(s)/side effects and non-pharmacologic comfort measures Outcome: Progressing   

## 2021-07-12 NOTE — H&P (Signed)
History and Physical    [name] RFF:[id number] DOB: Jul 18, 1936 DOA: 07/12/2021  PCP: [name], MD   Patient coming from: Home.  I have personally briefly reviewed patient's old medical records in [place]  Chief Complaint: Shortness of breath for the past 2 days.  HPI: Heidi Adams is a 85 y.o. female with medical history significant of seasonal allergies, unspecified anemia, left breast cancer, skin cancer, chronic combined systolic and diastolic CHF, dysrhythmia, hypertension, hypothyroid, PVCs who is coming to the emergency department with complaints of progressively worse dyspnea for the past 2 days.  She was diagnosed with influenza A on Friday.  She has had fatigue, malaise, initially a dry cough and congestion now coughing up occasionally yellowish phlegm.  No headache, runny nose, rhinorrhea, sore throat, but stated that her appetite is decreased.  She has been watching her sodium intake.  She has been wheezing.  She stated she is smoked for 15 to 20 years but quit in 1989.  Denied chest pain, palpitations, diaphoresis, PND, orthopnea or pitting edema of the lower extremities.   No abdominal pain, diarrhea, constipation, melena or hematochezia.  No flank pain, dysuria, frequency or hematuria.  No polyuria, polydipsia, polyphagia or blurred vision.  ED Course: Initial vital signs were temperature 97.8 F, pulse 66, respirations 20, BP 183/92 mmHg O2 sat 98% on room air.  The patient received azithromycin, Rocephin, furosemide 40 mg IVP x1, and a DuoNeb.  I added Solu-Medrol 40 mg IVP and magnesium sulfate 2 g IVPB.  Lab work: CBC showed a white count 7.2, hemoglobin 11.1 g/dL and platelets 250.  BMP showed a calcium of 8.2 mg/dL but was otherwise normal.  Influenza A PCR was positive.  BNP was 2000 0.7 3.8 pg/mL.  Procalcitonin was normal.  Imaging: One-view portable chest radiograph showed cardiomegaly, vascular congestion and patchy opacities in the right mid and lower  lung concerning for pneumonia.  Please see images and phonated report for further details.  Review of Systems: As per HPI otherwise all other systems reviewed and are negative.  Past Medical History:  Diagnosis Date   Allergy    Anemia    hx of anemia   Arthritis    Breast cancer ([place])    Left breast   Cancer ([place])    skin ca nose   Chronic combined systolic and diastolic CHF (congestive heart failure) ([place]) 09/22/2018   Chronic combined systolic and diastolic CHF (congestive heart failure) ([place]) 09/22/2018   Chronic combined systolic and diastolic CHF (congestive heart failure) ([place]) 09/22/2018   Dysrhythmia    went into Atrial Fibrillation after last surgery, HAS PALPITATIONS   History of skin cancer    Hypertension    Hypothyroidism    PVC (premature ventricular contraction)    Thyroiditis 1973   Past Surgical History:  Procedure Laterality Date   ABDOMINAL HYSTERECTOMY  90   TAH BSO   APPENDECTOMY     BREAST LUMPECTOMY Left 05/30/2017   BREAST LUMPECTOMY WITH RADIOACTIVE SEED LOCALIZATION Left 05/30/2017   Procedure: LEFT BREAST LUMPECTOMY WITH RADIOACTIVE SEED LOCALIZATION;  Surgeon: Excell Seltzer, MD;  Location: [facility];  Service: General;  Laterality: Left;   CATARACT EXTRACTION  09/10/2018   Right eye   CHOLECYSTECTOMY     dr Georgia Lopes   ELBOW SURGERY     EYE SURGERY     "on my eyelids" years ago   KNEE ARTHROSCOPY     left   THYROIDECTOMY  1973  TONSILLECTOMY     TOTAL HIP ARTHROPLASTY Right 05/10/2016   Procedure: TOTAL HIP ARTHROPLASTY ANTERIOR APPROACH;  Surgeon: Paralee Cancel, MD;  Location: [facility];  Service: Orthopedics;  Laterality: Right;   TOTAL HIP ARTHROPLASTY Left 01/10/2017   Procedure: LEFT TOTAL HIP ARTHROPLASTY ANTERIOR APPROACH;  Surgeon: Paralee Cancel, MD;  Location: [facility];  Service: Orthopedics;  Laterality: Left;   TOTAL KNEE ARTHROPLASTY Left 09/28/2015   Procedure: LEFT TOTAL KNEE ARTHROPLASTY;  Surgeon: Paralee Cancel, MD;   Location: [facility];  Service: Orthopedics;  Laterality: Left;   TOTAL KNEE ARTHROPLASTY Right 08/09/2016   Procedure: RIGHT TOTAL KNEE ARTHROPLASTY;  Surgeon: Paralee Cancel, MD;  Location: [facility];  Service: Orthopedics;  Laterality: Right;  Adductor Block   Total Left hip arthroplasty     01/10/17 Dr. Alvan Dame   Social History  reports that she quit smoking about 33 years ago. Her smoking use included cigarettes. She has a 20.00 pack-year smoking history. She has never used smokeless tobacco. She reports that she does not drink alcohol and does not use drugs.  Allergies  Allergen Reactions   Codeine Nausea Only   Epinephrine     Tachcardyia, chest pains tremors   Hydrocodone-Acetaminophen Other (See Comments)   Tenormin [Atenolol] Rash   Family History  Problem Relation Age of Onset   Diabetes Father    Hypertension Father    Stroke Father    Breast cancer Cousin    Pneumonia Sister    Prior to Admission medications   Medication Sig Start Date End Date Taking? Authorizing Provider  acetaminophen (TYLENOL) 500 MG tablet Take 1,000 mg by mouth 3 (three) times daily as needed for moderate pain.    Yes [provider]  albuterol (VENTOLIN HFA) 108 (90 Base) MCG/ACT inhaler 1 puff every 6 (six) hours as needed for wheezing or shortness of breath. 06/08/21  Yes [provider]  amiodarone (PACERONE) 200 MG tablet TAKE 1 TABLET BY MOUTH EVERY DAY Patient taking differently: Take 100 mg by mouth daily. 01/20/21  Yes Adrian Prows, MD  apixaban (ELIQUIS) 2.5 MG TABS tablet Take 1 tablet (2.5 mg total) by mouth 2 (two) times daily. 06/23/21  Yes Cantwell, Celeste C, PA-C  carboxymethylcellulose (REFRESH PLUS) 0.5 % SOLN Place 1 drop into both eyes daily as needed (dry eyes).   Yes [provider]  furosemide (LASIX) 40 MG tablet Take daily if weight up by 3 Lbs or shortness of breath or swelling. Take 40 mg once per day 2 times per week. 06/23/21  Yes Cantwell, Celeste C, PA-C   levothyroxine (SYNTHROID, LEVOTHROID) 112 MCG tablet Take 112-168 mcg by mouth daily before breakfast. Take 1 tablet (112 mcg) on Mondays, Tuesdays,, Thursdays, Fridays & Saturdays. Take 1.5 tablet (168 mcg) on Sundays & Wednesday   Yes [provider]  losartan (COZAAR) 50 MG tablet TAKE 1 TABLET BY MOUTH EACH NIGHT AT BEDTIME Patient taking differently: Take 25 mg by mouth at bedtime. 01/20/21  Yes Adrian Prows, MD  metoprolol succinate (TOPROL-XL) 50 MG 24 hr tablet Take 25 mg by mouth daily. Take with or immediately following a meal.   Yes [provider]  moxifloxacin (AVELOX) 400 MG tablet Take 400 mg by mouth daily. 07/09/21  Yes [provider]  Multiple Vitamins-Minerals (CENTRUM MINIS WOMEN 50+) TABS Take 1 tablet by mouth daily.   Yes [provider]  oseltamivir (TAMIFLU) 75 MG capsule Take 75 mg by mouth 2 (two) times daily. 07/09/21  Yes [provider]  pantoprazole (PROTONIX) 40 MG tablet Take 40 mg by mouth daily as needed (indigestion/heartburn.).  01/20/16  Yes [provider]  potassium chloride (KLOR-CON) 10 MEQ tablet Take 2 tablets (20 mEq total) by mouth every other day. Take every time with furosemide Patient taking differently: Take 20 mEq by mouth every 3 (three) days. Taking every three days with furosemide 02/24/21  Yes Adrian Prows, MD  meclizine (ANTIVERT) 25 MG tablet Take 1 tablet (25 mg total) by mouth 3 (three) times daily. Patient not taking: Reported on 06/23/2021 04/27/21   Kathie Dike, MD   Physical Exam: Vitals:   07/12/21 1315 07/12/21 1328 07/12/21 1330 07/12/21 1400  BP: (!) 158/76 (!) 151/74 (!) 154/80 (!) 137/97  Pulse: 64 64 67 65  Resp: (!) 23 (!) 23 20 18   Temp:      SpO2: 96% 98% 99% 98%  Weight:  53.1 kg    Height:  5\' 4"  (1.626 m)     Constitutional: Acutely ill-appearing, but in in no distress. Eyes: PERRL, lids and conjunctivae mildly injected.  Bilateral scleral injection. ENMT: Mucous  membranes are moist. Posterior pharynx clear of any exudate or lesions. Neck: normal, supple, no masses, no thyromegaly Respiratory: Bilateral rhonchi and wheezing, no crackles. Normal respiratory effort. No accessory muscle use.  Cardiovascular: Regular rate and rhythm, no murmurs / rubs / gallops. No extremity edema. 2+ pedal pulses. No carotid bruits.  Abdomen: No distention.  Soft, no tenderness, no masses palpated. No hepatosplenomegaly. Bowel sounds positive.  Musculoskeletal: Mild to moderate generalized weakness.  No clubbing / cyanosis.  Good ROM, no contractures. Normal muscle tone.  Skin: no acute rashes, lesions, ulcers on limited dermatological examination. Neurologic: CN 2-12 grossly intact. Sensation intact, DTR normal. Strength 5/5 in all 4.  Psychiatric: Normal judgment and insight. Alert and oriented x 3. Normal mood.   Labs on Admission: I have personally reviewed following labs and imaging studies  CBC: Recent Labs  Lab 07/12/21 0957  WBC 7.2  HGB 11.1*  HCT 36.3  MCV 93.3  PLT 962    Basic Metabolic Panel: Recent Labs  Lab 07/12/21 0957  NA 139  K 4.0  CL 108  CO2 22  GLUCOSE 91  BUN 21  CREATININE 0.83  CALCIUM 8.2*    GFR: Estimated Creatinine Clearance: 43.1 mL/min (by C-G formula based on SCr of 0.83 mg/dL).  Liver Function Tests: No results for input(s): AST, ALT, ALKPHOS, BILITOT, PROT, ALBUMIN in the last 168 hours.  Radiological Exams on Admission: DG Chest Port 1 View  Result Date: 07/12/2021 CLINICAL DATA:  Cough, shortness of breath EXAM: PORTABLE CHEST 1 VIEW COMPARISON:  02/06/2021 FINDINGS: Cardiomegaly with vascular congestion. Patchy opacities in the right mid and lower lung. No focal opacity on the left. No overt edema. No effusions or acute bony abnormality. IMPRESSION: Cardiomegaly, vascular congestion. Patchy opacities in the right mid and lower lung concerning for pneumonia. Electronically Signed   By: Rolm Baptise M.D.   On:  07/12/2021 09:50     Echocardiogram 03/04/2021:  Left ventricle cavity is normal in size. Moderate concentric hypertrophy  of the left ventricle. Severe global hypokinesis. LV systolic function  with visual EF 25-30%. Diastolic function not assessed due to severity of  valvular regurgitation.  Left atrial cavity is severely dilated.  Structurally normal trileaflet aortic valve. No evidence of aortic  stenosis. Moderate (Grade III) aortic regurgitation.  Moderate (Grade III) mitral regurgitation.  Moderate tricuspid regurgitation.  Moderate pulmonic regurgitation.  No evidence of pulmonary hypertension.  No significant change compared to previous study on 07/23/2020.   EKG: Independently reviewed.  Vent. rate 66 BPM PR interval 189 ms QRS duration 108 ms QT/QTcB 456/478 ms P-R-T axes 64 261 54 Sinus rhythm Atrial premature complex Left anterior fascicular block Abnormal R-wave progression, late transition Consider left ventricular hypertrophy  Assessment/Plan Principal Problem:   Influenza A Causing significant reactive airway disease. Observation elemetry. Supplemental oxygen as needed. Bronchodilators as needed. Solu-Medrol 40 mg IVP x1. Prednisone tapering AM. Continue Tamiflu 30 mg p.o. twice daily.  Active Problems:   Acute on chronic systolic CHF (congestive heart failure) (HCC) Supplemental oxygen as needed. Fluid and sodium restriction. Monitor daily weights, intake and output. Hold beta-blocker till tomorrow. Continue losartan 50 mg p.o. daily. Furosemide 20 mg IVP daily. Follow-up renal function electrolytes.    Hypothyroidism Continue levothyroxine.    AF (paroxysmal atrial fibrillation) (HCC) CHA?DS?-VASc Score of at least 6. Continue apixaban 5 mg p.o. twice daily.Marland Kitchen Resume metoprolol in a.m.    Gastroesophageal reflux disease  Continue PPI.    Hypocalcemia Recheck calcium in AM.    Normocytic anemia Monitor H&H.     DVT prophylaxis: On  apixaban. Code Status:   Full code.  Family Communication:   Disposition Plan:   Patient is from:  Home.  Anticipated DC to:  Home.  Anticipated DC date:  07/13/2021 or 07/14/2021.  Anticipated DC barriers: Clinical status.  Consults called:   Admission status:  Telemetry/observation.   Severity of Illness: High severity in the setting of influenza a day with concomitant acute on chronic systolic heart failure.  Reubin Milan MD [facility]  How to contact the [facility] Attending or Consulting provider [place] or covering provider during after hours [place], for this patient?   Check the care team in [facility] and look for a) attending/consulting [facility] provider listed and b) the [facility] team listed Log into [email or web address] and use [place]'s universal password to access. If you do not have the password, please contact the hospital operator. Locate the [facility] provider you are looking for under [facility] and page to a number that you can be directly reached. If you still have difficulty reaching the provider, please page the [facility] (Director on Call) for the Hospitalists listed on amion for assistance.  07/12/2021, 2:53 PM   This document was prepared using Dragon voice recognition software and may contain some unintended transcription errors.

## 2021-07-12 NOTE — ED Triage Notes (Signed)
Pt presents with c/o shortness of breath for 2 days. Pt does have a hx of CHF.

## 2021-07-12 NOTE — ED Notes (Addendum)
Patient was slid up in bed and Pure Elza Rafter was repositioned.

## 2021-07-12 NOTE — ED Provider Notes (Signed)
[facility] DEPT Provider Note   CSN: [id number] Arrival date & time: 07/12/21  0901     History Chief Complaint  Patient presents with   Shortness of Breath    Heidi Adams is a 85 y.o. female.  HPI 85 year old female history of breast cancer, CHF, hypertension presents today complaining of shortness of breath.  States shortness of breath began 2 weeks ago.  She has had some cough and congestion.  She is now coughing up yellowish colored sputum.  She denies any runny nose, nasal congestion, sore throat, fever, or chills.  She has had some intermittent chest discomfort.  She feels short of breath that is worse with sitting up.  She is on oxygen at night at half a liter and has been using her baseline oxygen as prescribed.  She denies any nausea, vomiting, diarrhea, peripheral edema, increased weight.     Past Medical History:  Diagnosis Date   Allergy    Anemia    hx of anemia   Arthritis    Breast cancer ([place])    Left breast   Cancer ([place])    skin ca nose   Chronic combined systolic and diastolic CHF (congestive heart failure) ([place]) 09/22/2018   Chronic combined systolic and diastolic CHF (congestive heart failure) ([place]) 09/22/2018   Chronic combined systolic and diastolic CHF (congestive heart failure) ([place]) 09/22/2018   Dysrhythmia    went into Atrial Fibrillation after last surgery, HAS PALPITATIONS   History of skin cancer    Hypertension    Hypothyroidism    PVC (premature ventricular contraction)    Thyroiditis 1973    Patient Active Problem List   Diagnosis Date Noted   Influenza A 07/12/2021   Vertigo 04/25/2021   Acute lower UTI 04/25/2021   Nonischemic cardiomyopathy ([place]) 02/24/2021   Orthostatic hypotension 60/60/0459   Acute systolic CHF (congestive heart failure) ([place]) 02/06/2021   Acute CHF (congestive heart failure) ([place]) 02/06/2021   Family history of colonic polyps 10/12/2020   Gastroesophageal reflux disease with  esophagitis and hemorrhage 10/12/2020   Hemorrhage of rectum and anus 10/12/2020   Slow transit constipation 10/12/2020   Iron deficiency anemia due to chronic blood loss 06/24/2020   E coli bacteremia 03/23/2020   A-fib ([place]) 03/23/2020   Acute cystitis 03/22/2020   Atrial fibrillation with RVR ([place]) 03/22/2020   History of congestive heart failure 03/22/2020   Elevated troponin    Swelling of right foot 10/25/2019   Dizziness 06/22/2019   Arthritis of wrist 97/74/1423   Chronic systolic heart failure ([place]) 09/22/2018   DDD (degenerative disc disease), cervical 08/20/2018   Left elbow pain 08/02/2018   Neck pain 08/02/2018   TIA (transient ischemic attack) 07/30/2018   Hypertensive urgency 07/30/2018   AF (paroxysmal atrial fibrillation) ([place]) 07/30/2018   Arthritis of carpometacarpal ([place]) joint of left thumb 06/20/2018   Pain in thumb joint with movement of left hand 05/08/2018   Pain in finger of right hand 01/02/2018   Pain in left knee 11/08/2017   Conductive hearing loss, bilateral 10/19/2017   Dry nose 10/19/2017   Chronic hip pain after total replacement of left hip joint 08/25/2017   Trochanteric bursitis of left hip 08/25/2017   Localized, primary osteoarthritis of elbow 08/22/2017   Synovitis and tenosynovitis 08/11/2017   Malignant neoplasm of upper-inner quadrant of left breast in female, estrogen receptor positive ([place]) 06/20/2017   S/P closed reduction of dislocated total hip prosthesis 02/03/2017   S/P left THA,  AA 01/10/2017   Fatigue 05/31/2016   Overweight (BMI 25.0-29.9) 05/11/2016   S/P right THA, AA 05/10/2016   Right hip pain 03/16/2016   Pes planus 03/16/2016   Abnormality of gait 03/16/2016   PAC (premature atrial contraction) 02/24/2016   PVC (premature ventricular contraction) 02/24/2016   Palpitations 01/27/2016   Benign paroxysmal positional vertigo of right ear 12/15/2015   Pharyngoesophageal dysphagia 12/15/2015   Hypothyroidism 11/26/2015    Supine hypertension 11/26/2015   S/P left TKA 45/80/9983   Lichen sclerosus 38/25/0539    Past Surgical History:  Procedure Laterality Date   ABDOMINAL HYSTERECTOMY  90   TAH BSO   APPENDECTOMY     BREAST LUMPECTOMY Left 05/30/2017   BREAST LUMPECTOMY WITH RADIOACTIVE SEED LOCALIZATION Left 05/30/2017   Procedure: LEFT BREAST LUMPECTOMY WITH RADIOACTIVE SEED LOCALIZATION;  Surgeon: Excell Seltzer, MD;  Location: [facility];  Service: General;  Laterality: Left;   CATARACT EXTRACTION  09/10/2018   Right eye   CHOLECYSTECTOMY     dr Georgia Lopes   ELBOW SURGERY     EYE SURGERY     "on my eyelids" years ago   KNEE ARTHROSCOPY     left   THYROIDECTOMY  1973   TONSILLECTOMY     TOTAL HIP ARTHROPLASTY Right 05/10/2016   Procedure: TOTAL HIP ARTHROPLASTY ANTERIOR APPROACH;  Surgeon: Paralee Cancel, MD;  Location: [facility];  Service: Orthopedics;  Laterality: Right;   TOTAL HIP ARTHROPLASTY Left 01/10/2017   Procedure: LEFT TOTAL HIP ARTHROPLASTY ANTERIOR APPROACH;  Surgeon: Paralee Cancel, MD;  Location: [facility];  Service: Orthopedics;  Laterality: Left;   TOTAL KNEE ARTHROPLASTY Left 09/28/2015   Procedure: LEFT TOTAL KNEE ARTHROPLASTY;  Surgeon: Paralee Cancel, MD;  Location: [facility];  Service: Orthopedics;  Laterality: Left;   TOTAL KNEE ARTHROPLASTY Right 08/09/2016   Procedure: RIGHT TOTAL KNEE ARTHROPLASTY;  Surgeon: Paralee Cancel, MD;  Location: [facility];  Service: Orthopedics;  Laterality: Right;  Adductor Block   Total Left hip arthroplasty     01/10/17 Dr. Alvan Dame     OB History     Gravida  2   Para  2   Term      Preterm      AB      Living  2      SAB      IAB      Ectopic      Multiple      Live Births              Family History  Problem Relation Age of Onset   Diabetes Father    Hypertension Father    Stroke Father    Breast cancer Cousin    Pneumonia Sister     Social History   Tobacco Use   Smoking status: Former    Packs/day: 1.00     Years: 20.00    Pack years: 20.00    Types: Cigarettes    Quit date: 07/26/1987    Years since quitting: 33.9   Smokeless tobacco: Never  Vaping Use   Vaping Use: Never used  Substance Use Topics   Alcohol use: No    Alcohol/week: 0.0 standard drinks   Drug use: No    Home Medications Prior to Admission medications   Medication Sig Start Date End Date Taking? Authorizing Provider  acetaminophen (TYLENOL) 500 MG tablet Take 1,000 mg by mouth 3 (three) times daily as needed for moderate pain.     [provider]  albuterol (VENTOLIN HFA) 108 (90 Base) MCG/ACT inhaler SMARTSIG:1 Puff(s) Via Inhaler Every 6 Hours PRN 06/08/21   [provider]  amiodarone (PACERONE) 200 MG tablet TAKE 1 TABLET BY MOUTH EVERY DAY Patient taking differently: Take 100 mg by mouth daily. 01/20/21   Adrian Prows, MD  apixaban (ELIQUIS) 2.5 MG TABS tablet Take 1 tablet (2.5 mg total) by mouth 2 (two) times daily. 06/23/21   Cantwell, Celeste C, PA-C  furosemide (LASIX) 40 MG tablet Take daily if weight up by 3 Lbs or shortness of breath or swelling. Take 40 mg once per day 2 times per week. 06/23/21   Cantwell, Anderson Malta C, PA-C  levothyroxine (SYNTHROID, LEVOTHROID) 112 MCG tablet Take 112-168 mcg by mouth daily before breakfast. Take 1 tablet (112 mcg) on Mondays, Tuesdays, Wednesdays, Thursdays, Fridays & Saturdays. Take 1.5 tablet (168 mcg) on Sundays    [provider]  losartan (COZAAR) 50 MG tablet TAKE 1 TABLET BY MOUTH EACH NIGHT AT BEDTIME Patient taking differently: Take 25 mg by mouth at bedtime. 01/20/21   Adrian Prows, MD  meclizine (ANTIVERT) 25 MG tablet Take 1 tablet (25 mg total) by mouth 3 (three) times daily. Patient not taking: Reported on 06/23/2021 04/27/21   Kathie Dike, MD  metoprolol succinate (TOPROL-XL) 50 MG 24 hr tablet Take 25 mg by mouth daily. Take with or immediately following a meal.    [provider]  pantoprazole (PROTONIX) 40 MG tablet Take  40 mg by mouth daily as needed (indigestion/heartburn.).  01/20/16   [provider]  potassium chloride (KLOR-CON) 10 MEQ tablet Take 2 tablets (20 mEq total) by mouth every other day. Take every time with furosemide Patient taking differently: Take 20 mEq by mouth every 3 (three) days. Taking every three days with furosemide 02/24/21   Adrian Prows, MD    Allergies    Codeine, Epinephrine, and Tenormin [atenolol]  Review of Systems   Review of Systems  All other systems reviewed and are negative.  Physical Exam Updated Vital Signs BP (!) 191/79    Pulse 73    Temp 97.8 F (36.6 C)    Resp (!) 22    LMP 07/25/1988 Comment: full    SpO2 96%   Physical Exam Vitals and nursing note reviewed.  Constitutional:      General: She is not in acute distress.    Appearance: She is well-developed. She is ill-appearing.  HENT:     Head: Normocephalic.     Mouth/Throat:     Mouth: Mucous membranes are moist.  Eyes:     Pupils: Pupils are equal, round, and reactive to light.  Cardiovascular:     Rate and Rhythm: Normal rate and regular rhythm.  Pulmonary:     Effort: Pulmonary effort is normal.     Breath sounds: Rhonchi present.  Abdominal:     General: Bowel sounds are normal.     Palpations: Abdomen is soft.  Musculoskeletal:        General: Normal range of motion.     Cervical back: Normal range of motion.  Skin:    General: Skin is warm.     Capillary Refill: Capillary refill takes less than 2 seconds.  Neurological:     General: No focal deficit present.     Mental Status: She is alert.  Psychiatric:        Mood and Affect: Mood normal.    ED Results / Procedures / Treatments   Labs (all labs ordered are  listed, but only abnormal results are displayed) Labs Reviewed  RESP PANEL BY RT-PCR (FLU A&B, COVID) ARPGX2 - Abnormal; Notable for the following components:      Result Value   Influenza A by PCR POSITIVE (*)    All other components within normal limits  CBC -  Abnormal; Notable for the following components:   Hemoglobin 11.1 (*)    RDW 16.1 (*)    All other components within normal limits  BASIC METABOLIC PANEL - Abnormal; Notable for the following components:   Calcium 8.2 (*)    All other components within normal limits  BRAIN NATRIURETIC PEPTIDE - Abnormal; Notable for the following components:   B Natriuretic Peptide 2,073.8 (*)    All other components within normal limits    EKG None  Radiology DG Chest Port 1 View  Result Date: 07/12/2021 CLINICAL DATA:  Cough, shortness of breath EXAM: PORTABLE CHEST 1 VIEW COMPARISON:  02/06/2021 FINDINGS: Cardiomegaly with vascular congestion. Patchy opacities in the right mid and lower lung. No focal opacity on the left. No overt edema. No effusions or acute bony abnormality. IMPRESSION: Cardiomegaly, vascular congestion. Patchy opacities in the right mid and lower lung concerning for pneumonia. Electronically Signed   By: Rolm Baptise M.D.   On: 07/12/2021 09:50    Procedures Procedures   Medications Ordered in ED Medications  albuterol (VENTOLIN HFA) 108 (90 Base) MCG/ACT inhaler 2 puff (2 puffs Inhalation Given 07/12/21 0957)  cefTRIAXone (ROCEPHIN) 1 g in sodium chloride 0.9 % 100 mL IVPB (has no administration in time range)  azithromycin (ZITHROMAX) 500 mg in sodium chloride 0.9 % 250 mL IVPB (has no administration in time range)  oseltamivir (TAMIFLU) capsule 75 mg (has no administration in time range)  furosemide (LASIX) injection 40 mg (40 mg Intravenous Given 07/12/21 1000)  aerochamber Z-Stat Plus/medium 1 each (1 each Other Given 07/12/21 1000)    ED Course  I have reviewed the triage vital signs and the nursing notes.  Pertinent labs & imaging results that were available during my care of the patient were reviewed by me and considered in my medical decision making (see chart for details).  Patient with infiltrate on chest x-Senie Lanese.  Elevated BNP.  Patient given Lasix with about 1  L output Patient treated with Rocephin and Zithromax for community-acquired pneumonia Patient flu positive and has begun treatment outpatient she is continued on Tamiflu here  MDM Rules/Calculators/A&P                           Patient with flu, community-acquired pneumonia, and CHF.  Patient treated as above.  Care discussed with Dr. Olevia Bowens and plan for admission due to age with multiple morbidities and comorbidities.  Final Clinical Impression(s) / ED Diagnoses Final diagnoses:  Dyspnea, unspecified type  Influenza A  Community acquired pneumonia of right lung, unspecified part of lung  Acute on chronic congestive heart failure, unspecified heart failure type [facility])    Rx / DC Orders ED Discharge Orders     None        Pattricia Boss, MD 07/12/21 1146

## 2021-07-13 DIAGNOSIS — Z823 Family history of stroke: Secondary | ICD-10-CM | POA: Diagnosis not present

## 2021-07-13 DIAGNOSIS — Z79890 Hormone replacement therapy (postmenopausal): Secondary | ICD-10-CM | POA: Diagnosis not present

## 2021-07-13 DIAGNOSIS — Z96643 Presence of artificial hip joint, bilateral: Secondary | ICD-10-CM | POA: Diagnosis present

## 2021-07-13 DIAGNOSIS — Z8249 Family history of ischemic heart disease and other diseases of the circulatory system: Secondary | ICD-10-CM | POA: Diagnosis not present

## 2021-07-13 DIAGNOSIS — R0600 Dyspnea, unspecified: Secondary | ICD-10-CM | POA: Diagnosis not present

## 2021-07-13 DIAGNOSIS — J441 Chronic obstructive pulmonary disease with (acute) exacerbation: Secondary | ICD-10-CM | POA: Diagnosis present

## 2021-07-13 DIAGNOSIS — J101 Influenza due to other identified influenza virus with other respiratory manifestations: Secondary | ICD-10-CM | POA: Diagnosis not present

## 2021-07-13 DIAGNOSIS — D638 Anemia in other chronic diseases classified elsewhere: Secondary | ICD-10-CM | POA: Diagnosis present

## 2021-07-13 DIAGNOSIS — Z96653 Presence of artificial knee joint, bilateral: Secondary | ICD-10-CM | POA: Diagnosis present

## 2021-07-13 DIAGNOSIS — I5023 Acute on chronic systolic (congestive) heart failure: Secondary | ICD-10-CM | POA: Diagnosis not present

## 2021-07-13 DIAGNOSIS — Z79899 Other long term (current) drug therapy: Secondary | ICD-10-CM | POA: Diagnosis not present

## 2021-07-13 DIAGNOSIS — Z66 Do not resuscitate: Secondary | ICD-10-CM | POA: Diagnosis present

## 2021-07-13 DIAGNOSIS — J189 Pneumonia, unspecified organism: Secondary | ICD-10-CM | POA: Insufficient documentation

## 2021-07-13 DIAGNOSIS — Z803 Family history of malignant neoplasm of breast: Secondary | ICD-10-CM | POA: Diagnosis not present

## 2021-07-13 DIAGNOSIS — Z87891 Personal history of nicotine dependence: Secondary | ICD-10-CM | POA: Diagnosis not present

## 2021-07-13 DIAGNOSIS — I480 Paroxysmal atrial fibrillation: Secondary | ICD-10-CM | POA: Diagnosis not present

## 2021-07-13 DIAGNOSIS — Z833 Family history of diabetes mellitus: Secondary | ICD-10-CM | POA: Diagnosis not present

## 2021-07-13 DIAGNOSIS — Z853 Personal history of malignant neoplasm of breast: Secondary | ICD-10-CM | POA: Diagnosis not present

## 2021-07-13 DIAGNOSIS — Z885 Allergy status to narcotic agent status: Secondary | ICD-10-CM | POA: Diagnosis not present

## 2021-07-13 DIAGNOSIS — Z20822 Contact with and (suspected) exposure to covid-19: Secondary | ICD-10-CM | POA: Diagnosis present

## 2021-07-13 DIAGNOSIS — E039 Hypothyroidism, unspecified: Secondary | ICD-10-CM | POA: Diagnosis not present

## 2021-07-13 DIAGNOSIS — I110 Hypertensive heart disease with heart failure: Secondary | ICD-10-CM | POA: Diagnosis present

## 2021-07-13 DIAGNOSIS — Z888 Allergy status to other drugs, medicaments and biological substances status: Secondary | ICD-10-CM | POA: Diagnosis not present

## 2021-07-13 DIAGNOSIS — Z7901 Long term (current) use of anticoagulants: Secondary | ICD-10-CM | POA: Diagnosis not present

## 2021-07-13 DIAGNOSIS — K219 Gastro-esophageal reflux disease without esophagitis: Secondary | ICD-10-CM | POA: Diagnosis not present

## 2021-07-13 LAB — GLUCOSE, CAPILLARY: Glucose-Capillary: 135 mg/dL — ABNORMAL HIGH (ref 70–99)

## 2021-07-13 LAB — BASIC METABOLIC PANEL
Anion gap: 11 (ref 5–15)
BUN: 19 mg/dL (ref 8–23)
CO2: 28 mmol/L (ref 22–32)
Calcium: 8.3 mg/dL — ABNORMAL LOW (ref 8.9–10.3)
Chloride: 103 mmol/L (ref 98–111)
Creatinine, Ser: 0.95 mg/dL (ref 0.44–1.00)
GFR, Estimated: 59 mL/min — ABNORMAL LOW (ref >60)
Glucose, Bld: 152 mg/dL — ABNORMAL HIGH (ref 70–99)
Potassium: 3.6 mmol/L (ref 3.5–5.1)
Sodium: 142 mmol/L (ref 135–145)

## 2021-07-13 LAB — PROCALCITONIN: Procalcitonin: <0.1 ng/mL

## 2021-07-13 MED ORDER — FUROSEMIDE 40 MG PO TABS
40.0000 mg | ORAL_TABLET | Freq: Every day | ORAL | Status: DC
  Administered 2021-07-14 – 2021-07-15 (×2): 40 mg via ORAL
  Filled 2021-07-13 (×2): qty 1

## 2021-07-13 MED ORDER — CEFTRIAXONE SODIUM 1 G IJ SOLR
1.0000 g | INTRAMUSCULAR | Status: DC
  Administered 2021-07-13: 14:00:00 1 g via INTRAVENOUS
  Filled 2021-07-13 (×3): qty 10

## 2021-07-13 MED ORDER — MOMETASONE FURO-FORMOTEROL FUM 200-5 MCG/ACT IN AERO
2.0000 | INHALATION_SPRAY | Freq: Two times a day (BID) | RESPIRATORY_TRACT | Status: DC
  Administered 2021-07-14 – 2021-07-15 (×3): 2 via RESPIRATORY_TRACT
  Filled 2021-07-13: qty 8.8

## 2021-07-13 MED ORDER — IPRATROPIUM-ALBUTEROL 0.5-2.5 (3) MG/3ML IN SOLN
3.0000 mL | Freq: Four times a day (QID) | RESPIRATORY_TRACT | Status: DC
  Administered 2021-07-13: 20:00:00 3 mL via RESPIRATORY_TRACT
  Filled 2021-07-13 (×2): qty 3

## 2021-07-13 MED ORDER — GUAIFENESIN-DM 100-10 MG/5ML PO SYRP: 5.0000 mL | ORAL_SOLUTION | ORAL | Status: DC | PRN

## 2021-07-13 MED ORDER — DOXYCYCLINE HYCLATE 100 MG PO TABS
100.0000 mg | ORAL_TABLET | Freq: Two times a day (BID) | ORAL | Status: DC
  Administered 2021-07-13 – 2021-07-15 (×5): 100 mg via ORAL
  Filled 2021-07-13 (×5): qty 1

## 2021-07-13 MED ORDER — SIMETHICONE 80 MG PO CHEW: 80.0000 mg | CHEWABLE_TABLET | Freq: Once | ORAL | Status: DC

## 2021-07-13 MED ORDER — IPRATROPIUM-ALBUTEROL 0.5-2.5 (3) MG/3ML IN SOLN
3.0000 mL | RESPIRATORY_TRACT | Status: DC | PRN
  Administered 2021-07-13: 14:00:00 3 mL via RESPIRATORY_TRACT

## 2021-07-13 MED ORDER — IPRATROPIUM-ALBUTEROL 0.5-2.5 (3) MG/3ML IN SOLN
3.0000 mL | Freq: Three times a day (TID) | RESPIRATORY_TRACT | Status: DC
Start: 2021-07-14 — End: 2021-07-14
  Administered 2021-07-14 (×2): 3 mL via RESPIRATORY_TRACT
  Filled 2021-07-13 (×3): qty 3

## 2021-07-13 MED ORDER — AZITHROMYCIN 250 MG PO TABS: 500.0000 mg | ORAL_TABLET | Freq: Every day | ORAL | Status: DC

## 2021-07-13 NOTE — Evaluation (Signed)
Physical Therapy Evaluation Patient Details Name: Heidi Adams MRN: [id number] DOB: [birth year] Today's Date: 07/13/2021  History of Present Illness  85 yo female PMH: left breast cancer, chronic anemia, systolic heart failure, HTN, and hypothyroid, bil THA, bil TKA . admitted iwth dyspnea,  diagnosed with influenza A on 12/16, admitted with  Clinical Impression  Pt admitted with above diagnosis.  PT amb short hallway distance with moderate fatigue, denies dyspnea. Pt on RA on PT arrival. Pt requested to sit in recliner, states she was up in chair earlier (no alarm in chair). Pt motivated to mobilize with PT, she is slightly tearful stating her brother just passed away.  Recommend HHPT  Pt currently with functional limitations due to the deficits listed below (see PT Problem List). Pt will benefit from skilled PT to increase their independence and safety with mobility to allow discharge to the venue listed below.          Recommendations for follow up therapy are one component of a multi-disciplinary discharge planning process, led by the attending physician.  Recommendations may be updated based on patient status, additional functional criteria and insurance authorization.  Follow Up Recommendations Home health PT    Assistance Recommended at Discharge Intermittent Supervision/Assistance  Functional Status Assessment Patient has had a recent decline in their functional status and demonstrates the ability to make significant improvements in function in a reasonable and predictable amount of time.  Equipment Recommendations  None recommended by PT    Recommendations for Other Services       Precautions / Restrictions Precautions Precautions: Fall Restrictions Weight Bearing Restrictions: No      Mobility  Bed Mobility Overal bed mobility: Needs Assistance Bed Mobility: Supine to Sit     Supine to sit: Min guard     General bed mobility comments: min/guard to elevate trunk,  incr time    Transfers Overall transfer level: Needs assistance Equipment used: Rolling walker (2 wheels) Transfers: Sit to/from Stand Sit to Stand: Min guard           General transfer comment: cues for hand placement    Ambulation/Gait Ambulation/Gait assistance: Min guard Gait Distance (Feet): 50 Feet Assistive device: Rolling walker (2 wheels) Gait Pattern/deviations: Step-through pattern;Decreased stride length Gait velocity: decr     General Gait Details: cues for RW proximity to self, min/guard for safety. pt reports fatigue however denies excessive dyspnea  Stairs            Wheelchair Mobility    Modified Rankin (Stroke Patients Only)       Balance Overall balance assessment: Needs assistance Sitting-balance support: Feet supported;No upper extremity supported Sitting balance-Leahy Scale: Good     Standing balance support: During functional activity;No upper extremity supported;Bilateral upper extremity supported Standing balance-Leahy Scale: Fair Standing balance comment: able to static stand without UE support                             Pertinent Vitals/Pain Pain Assessment: No/denies pain    Home Living Family/patient expects to be discharged to:: Private residence Living Arrangements: Spouse/significant other Available Help at Discharge: Family;Available 24 hours/day Type of Home: House Home Access: Stairs to enter Entrance Stairs-Rails: None Entrance Stairs-Number of Steps: 2   Home Layout: One level Home Equipment: Conservation officer, nature (2 wheels);Rollator (4 wheels)      Prior Function Prior Level of Function : Independent/Modified Independent  Mobility Comments: pt reports she amb with the rollator or RW when going longer distances/community ADLs Comments: reports independence     Hand Dominance        Extremity/Trunk Assessment   Upper Extremity Assessment Upper Extremity Assessment: Generalized  weakness    Lower Extremity Assessment Lower Extremity Assessment: Generalized weakness       Communication   Communication: No difficulties  Cognition Arousal/Alertness: Awake/alert Behavior During Therapy: WFL for tasks assessed/performed Overall Cognitive Status: Within Functional Limits for tasks assessed                                          General Comments      Exercises     Assessment/Plan    PT Assessment Patient needs continued PT services  PT Problem List Decreased strength;Decreased mobility;Decreased range of motion;Decreased balance;Decreased activity tolerance;Decreased knowledge of use of DME;Cardiopulmonary status limiting activity       PT Treatment Interventions DME instruction;Therapeutic activities;Gait training;Functional mobility training;Therapeutic exercise;Patient/family education    PT Goals (Current goals can be found in the Care Plan section)  Acute Rehab PT Goals Patient Stated Goal: home PT Goal Formulation: With patient Time For Goal Achievement: 07/27/21 Potential to Achieve Goals: Good    Frequency Min 3X/week   Barriers to discharge        Co-evaluation               AM-PAC PT "6 Clicks" Mobility  Outcome Measure Help needed turning from your back to your side while in a flat bed without using bedrails?: A Little Help needed moving from lying on your back to sitting on the side of a flat bed without using bedrails?: A Little Help needed moving to and from a bed to a chair (including a wheelchair)?: A Little Help needed standing up from a chair using your arms (e.g., wheelchair or bedside chair)?: A Little Help needed to walk in hospital room?: A Little Help needed climbing 3-5 steps with a railing? : A Lot 6 Click Score: 17    End of Session   Activity Tolerance: Patient limited by fatigue;Patient tolerated treatment well Patient left: in chair;with call bell/phone within reach (pt reports beign  inchair earlier, no alarm in chair on arrival) Nurse Communication: Mobility status PT Visit Diagnosis: Other abnormalities of gait and mobility (R26.89)    Time: 6256-3893 PT Time Calculation (min) (ACUTE ONLY): 17 min   Charges:   PT Evaluation $PT Eval Low Complexity: 1 Low          Demauri Advincula, PT  Acute Rehab Dept ([place]) [phone] Pager [phone]  07/13/2021   [facility] 07/13/2021, 5:30 PM

## 2021-07-13 NOTE — Progress Notes (Addendum)
PROGRESS NOTE    Heidi Adams  YBO:[id number] DOB: May 01, 1936 DOA: 07/12/2021 PCP: [name], MD    Brief Narrative:  Heidi Adams was admitted to the hospital with the working diagnosis of acute influenza A, complicated with right lower lobe community acquired pneumonia and acute decompensation of chronic systolic heart failure.  85 yo female with the past medical history of left breast cancer, chronic anemia, systolic heart failure, HTN, and hypothyroid who presented with dyspnea. She was diagnosed with influenza A on 12/16 her symptoms consisted in fatigue, malaise, dry cough and congestion. For the last 2 days her symptoms worsened with dyspnea and wheezing. Positive tobacco abuse for 15 to 20 years, not diagnosis of COPD. On her initial physical examination her blood pressure was 183/92, HR 66, RR 20, temp 97,8 and oxygen saturation 98% on room air.   Acutely ill appearing, lungs with bilateral wheezing, heart S1 and S2 present and tachycardic, abdomen was soft and non tender, no lower extremity edema.  Na 139, K 4,0, Cl 108,. Bicarbonate 22, glucose 91, BUN 21 and serum cr at 0,83.  BNP 2,073 Wbc 7,2, hgb 11.1 hvt 36,3 and plt 250  SARS COVID 19 negative, influenza B negative, influenza A positive.   Chest film with hyperinflation, right lower lobe interstitial infiltrate, with positive hilar vascular congestion, positive cardiomegaly.   EKG 66 bpm, left axis deviation, left anterior fascicular block, sinus rhythm with pac, normal intervals, St segment and T wave inversion in V6, positive LVH.   Assessment & Plan:   Principal Problem:   Influenza A Active Problems:   Hypothyroidism   AF (paroxysmal atrial fibrillation) (HCC)   Acute on chronic systolic CHF (congestive heart failure) (HCC)   Hypocalcemia   Normocytic anemia   GERD (gastroesophageal reflux disease)   Community acquired pneumonia   Acute influenza A infection, complicated with community acquired pneumonia.  Dyspnea has improved, but continue to have cough, no chest pain. Chest film with right base interstitial infiltrate.  Oxymetry is 100% on 1 L/min per [place] Patient has diureses 1 L over last 24 hrs.   Plan to continue antibiotic therapy with ceftriaxone and oral azithromycin Follow up chest film in am after diuresis. Continue with bronchodilator therapy and antitussive agents. Airway clearing techniques with flutter valve and incentive spirometer.  Out of bed to chair tid with meals, follow up with PT and OT recommendations.   2. Acute on chronic systolic heart failure. Paroxysmal atrial fibrillation.  Positive volume overload on admission, positive signs of acute cardiogenic pulmonary edema. Patient had diuresis with IVC furosemide with improvement in volume status.  Will continue diuresis with oral furosemide.  Continue blood pressure control with losartan and metoprolol.  Continue anticoagulation with apixaban and rate control with amiodarone.   3. Tobacco abuse, possible undiagnosed COPD, with possible exacerbation. Plan to continue oxymetry monitoring.  Inhaled corticosteroids and bronchodilator therapy.  Discontinue systemic steroids for now.   4. Hypothyroid. Continue with levothyroxine   5. Anemia of chronic diease. Hgb is 11.1 and hct at 36,3   Patient continue to be at high risk for  worsening pneumonia   Status is: Observation  The patient will require care spanning > 2 midnights and should be moved to inpatient because: Iv antibiotic therapy and oxymetry monitoring   DVT prophylaxis: Enoxaparin   Code Status:    DNR  Family Communication:   I spoke with patient's husband at the bedside, we talked in detail about patient's condition, plan of care  and prognosis and all questions were addressed.     Antimicrobials:  Ceftriaxone and oral azithromycin     Subjective:  Patient with improvement of dyspnea but not back to baseline, continue to have cough and congestion.  Continue to be very weak and deconditioned   Objective: Vitals:   07/12/21 2023 07/12/21 2352 07/13/21 0104 07/13/21 0351  BP: (!) 171/79 (!) 171/75  (!) 172/82  Pulse: 70 63  62  Resp: 20 18  18   Temp: (!) 97.5 F (36.4 C) 97.7 F (36.5 C)  97.9 F (36.6 C)  TempSrc: Oral Axillary  Axillary  SpO2: 95% 98%  100%  Weight: 55.1 kg  55.1 kg   Height:        Intake/Output Summary (Last 24 hours) at 07/13/2021 1110 Last data filed at 07/13/2021 1000 Gross per 24 hour  Intake 240 ml  Output 1150 ml  Net -910 ml   Filed Weights   07/12/21 1328 07/12/21 2023 07/13/21 0104  Weight: 53.1 kg 55.1 kg 55.1 kg    Examination:   General: Not in pain or dyspnea, deconditioned  Neurology: Awake and alert, non focal  E ENT: mild pallor, no icterus, oral mucosa moist Cardiovascular: No JVD. S1-S2 present, rhythmic, no gallops, rubs, or murmurs. No lower extremity edema. Pulmonary: positive breath sounds bilaterally, decreased air movement, no wheezing, but bilateral rhonchi and rales. Gastrointestinal. Abdomen soft and non tender Skin. No rashes Musculoskeletal: no joint deformities     Data Reviewed: I have personally reviewed following labs and imaging studies  CBC: Recent Labs  Lab 07/12/21 0957  WBC 7.2  HGB 11.1*  HCT 36.3  MCV 93.3  PLT 536   Basic Metabolic Panel: Recent Labs  Lab 07/12/21 0957 07/13/21 0518  NA 139 142  K 4.0 3.6  CL 108 103  CO2 22 28  GLUCOSE 91 152*  BUN 21 19  CREATININE 0.83 0.95  CALCIUM 8.2* 8.3*   GFR: Estimated Creatinine Clearance: 38.7 mL/min (by C-G formula based on SCr of 0.95 mg/dL). Liver Function Tests: No results for input(s): AST, ALT, ALKPHOS, BILITOT, PROT, ALBUMIN in the last 168 hours. No results for input(s): LIPASE, AMYLASE in the last 168 hours. No results for input(s): AMMONIA in the last 168 hours. Coagulation Profile: No results for input(s): INR, PROTIME in the last 168 hours. Cardiac Enzymes: No results  for input(s): CKTOTAL, CKMB, CKMBINDEX, TROPONINI in the last 168 hours. BNP (last 3 results) No results for input(s): PROBNP in the last 8760 hours. HbA1C: No results for input(s): HGBA1C in the last 72 hours. CBG: Recent Labs  Lab 07/12/21 1747  GLUCAP 122*   Lipid Profile: No results for input(s): CHOL, HDL, LDLCALC, TRIG, CHOLHDL, LDLDIRECT in the last 72 hours. Thyroid Function Tests: No results for input(s): TSH, T4TOTAL, FREET4, T3FREE, THYROIDAB in the last 72 hours. Anemia Panel: No results for input(s): VITAMINB12, FOLATE, FERRITIN, TIBC, IRON, RETICCTPCT in the last 72 hours.    Radiology Studies: I have reviewed all of the imaging during this hospital visit personally     Scheduled Meds:  amiodarone  100 mg Oral Daily   apixaban  2.5 mg Oral BID   furosemide  20 mg Intravenous Daily   levofloxacin  250 mg Oral Daily   levothyroxine  112 mcg Oral Once per day on Mon Tue Thu Fri Sat   [START ON 07/14/2021] levothyroxine  168 mcg Oral Once per day on Sun Wed   losartan  25 mg Oral QHS  metoprolol succinate  25 mg Oral Daily   oseltamivir  30 mg Oral BID   predniSONE  40 mg Oral Q breakfast   simethicone  80 mg Oral Once   Continuous Infusions:   LOS: 0 days        Albion Weatherholtz Gerome Apley, MD

## 2021-07-13 NOTE — Progress Notes (Signed)
PT demonstrated hands on understanding of Flutter device. 

## 2021-07-14 ENCOUNTER — Inpatient Hospital Stay (HOSPITAL_COMMUNITY): Payer: PPO

## 2021-07-14 LAB — BASIC METABOLIC PANEL
Anion gap: 9 (ref 5–15)
BUN: 24 mg/dL — ABNORMAL HIGH (ref 8–23)
CO2: 28 mmol/L (ref 22–32)
Calcium: 8.5 mg/dL — ABNORMAL LOW (ref 8.9–10.3)
Chloride: 101 mmol/L (ref 98–111)
Creatinine, Ser: 0.9 mg/dL (ref 0.44–1.00)
GFR, Estimated: >60 mL/min (ref >60)
Glucose, Bld: 121 mg/dL — ABNORMAL HIGH (ref 70–99)
Potassium: 3.3 mmol/L — ABNORMAL LOW (ref 3.5–5.1)
Sodium: 138 mmol/L (ref 135–145)

## 2021-07-14 IMAGING — DX DG CHEST 1V
1 series · 1 of 1 positions shown · non-contrast
Comparison: Chest x-ray dated July 12, 2021

CLINICAL DATA: Dyspnea

EXAM:
CHEST  1 VIEW

[chest ap]
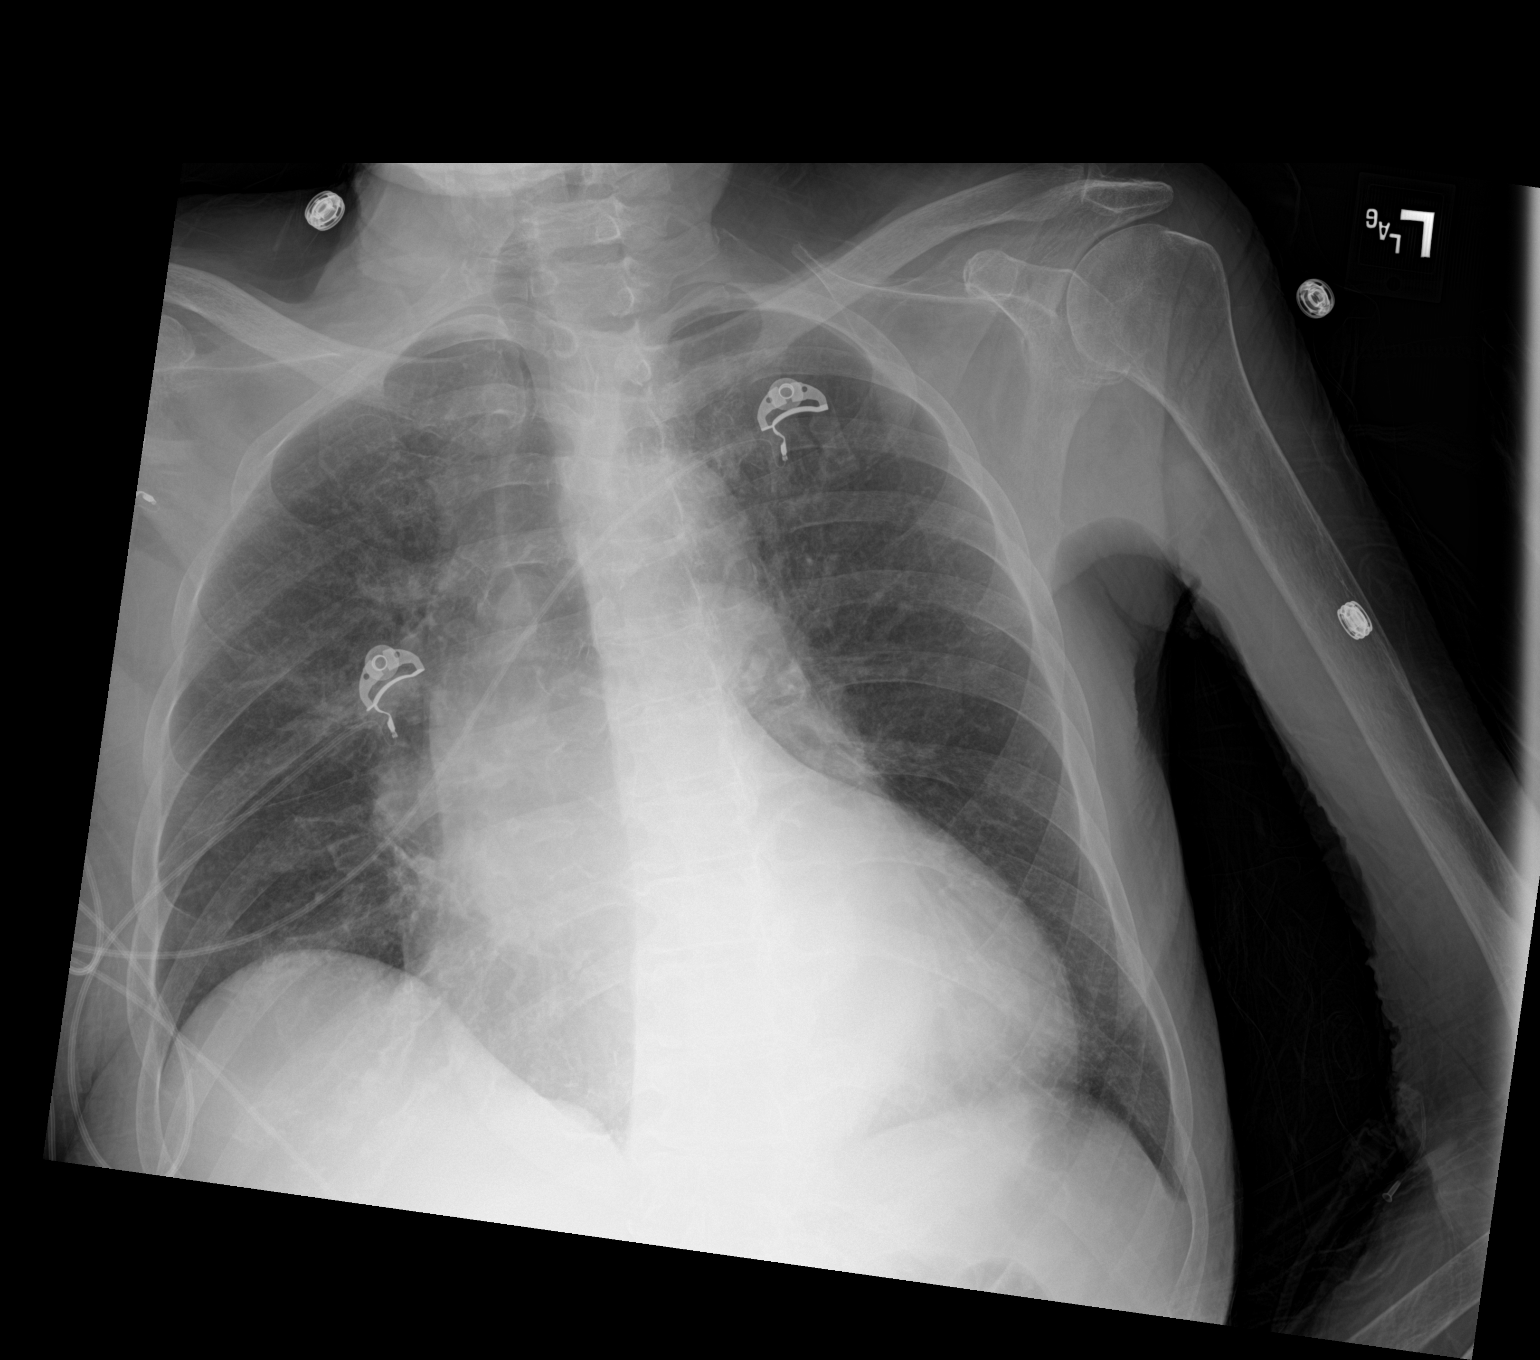

[1 of 1 positions shown; findings below may reference images not displayed]

FINDINGS: Patient is rotated to the right. Cardiac contours are unchanged
enlarged. No focal consolidation. No large pleural effusion or
pneumothorax.
IMPRESSION: No active disease.

## 2021-07-14 MED ORDER — POTASSIUM CHLORIDE CRYS ER 20 MEQ PO TBCR
40.0000 meq | EXTENDED_RELEASE_TABLET | Freq: Once | ORAL | Status: DC
  Administered 2021-07-14: 12:00:00 40 meq via ORAL
  Filled 2021-07-14: qty 2

## 2021-07-14 MED ORDER — PREDNISONE 20 MG PO TABS
40.0000 mg | ORAL_TABLET | Freq: Every day | ORAL | Status: DC
  Administered 2021-07-14 – 2021-07-15 (×2): 40 mg via ORAL
  Filled 2021-07-14 (×2): qty 2

## 2021-07-14 MED ORDER — IPRATROPIUM-ALBUTEROL 0.5-2.5 (3) MG/3ML IN SOLN
3.0000 mL | Freq: Two times a day (BID) | RESPIRATORY_TRACT | Status: DC
  Administered 2021-07-15: 08:00:00 3 mL via RESPIRATORY_TRACT
  Filled 2021-07-14: qty 3

## 2021-07-14 NOTE — Progress Notes (Addendum)
PROGRESS NOTE    OLIVE ZMUDA  LSL:[id number] DOB: 1935/09/08 DOA: 07/12/2021 PCP: [name], MD    Brief Narrative:  Mrs. Herford was admitted to the hospital with the working diagnosis of acute influenza A, complicated with acute decompensation of chronic systolic heart failure.   85 yo female with the past medical history of left breast cancer, chronic anemia, systolic heart failure, HTN, and hypothyroid who presented with dyspnea. She was diagnosed with influenza A on 12/16 her symptoms consisted in fatigue, malaise, dry cough and congestion. For the last 2 days her symptoms worsened with dyspnea and wheezing. Positive tobacco abuse for 15 to 20 years, not diagnosis of COPD. On her initial physical examination her blood pressure was 183/92, HR 66, RR 20, temp 97,8 and oxygen saturation 98% on room air.   Acutely ill appearing, lungs with bilateral wheezing, heart S1 and S2 present and tachycardic, abdomen was soft and non tender, no lower extremity edema.   Na 139, K 4,0, Cl 108,. Bicarbonate 22, glucose 91, BUN 21 and serum cr at 0,83.  BNP 2,073 Wbc 7,2, hgb 11.1 hvt 36,3 and plt 250  SARS COVID 19 negative, influenza B negative, influenza A positive.    Chest film with hyperinflation, right lower lobe interstitial infiltrate, with positive hilar vascular congestion, positive cardiomegaly.    EKG 66 bpm, left axis deviation, left anterior fascicular block, sinus rhythm with pac, normal intervals, St segment and T wave inversion in V6, positive LVH.    Patient placed on IV diuretics and bronchodilator therapy. Follow up chest film with resolution of right lower lobe interstitial infiltrate,   Assessment & Plan:   Principal Problem:   Influenza A Active Problems:   Hypothyroidism   AF (paroxysmal atrial fibrillation) (HCC)   Acute on chronic systolic CHF (congestive heart failure) (HCC)   Hypocalcemia   Normocytic anemia   GERD (gastroesophageal reflux disease)   Acute  influenza A infection. Patient's dyspnea has improved but continue to have wheezing and cough.  Her follow up chest film has resolution of infiltrate, ruling out bacterial pneumonia.   On bronchodilator therapy and antitussive agents. Continue with flutter valve and incentive spirometer.  Out of bed to chair tid with meals, follow up with PT and OT recommendations.  Patient has completed oseltamivir Continue with oral doxycycline and systemic corticosteroids for airway inflammation  Continue with inhaled corticosteroids.    2. Acute on chronic systolic heart failure. Paroxysmal atrial fibrillation.   Improved volume status and resolution of infiltrate on chest film. On losartan and metoprolol for blood pressure control.  Anticoagulation with apixaban and rate control with amiodarone.    3. Tobacco abuse, possible undiagnosed COPD, with possible exacerbation. Continue with inhaled corticosteroids and bronchodilator therapy.  Will need outpatient PFT.  Will resume steroids with prednisone 40 mg daily    4. Hypothyroid.  On levothyroxine    5. Anemia of chronic diease. Hgb is 11.1 and hct at 36,3   cell count has been stable    Status is: Inpatient  Remains inpatient appropriate because: respiratory monitoring    DVT prophylaxis: Apixaban   Code Status:    DNR Family Communication: I spoke with patient's husband at the bedside, we talked in detail about patient's condition, plan of care and prognosis and all questions were addressed.       Subjective: Patient is feeling better, but not back to her baseline, continue to have cough and wheezing, no chest pain.   Objective: Vitals:  07/13/21 2028 07/14/21 0500 07/14/21 0751 07/14/21 1015  BP: (!) 139/56 (!) 162/78    Pulse: (!) 59     Resp: 18 18    Temp: 97.6 F (36.4 C) 97.8 F (36.6 C)    TempSrc: Oral Oral    SpO2: 90% 98% 97% 94%  Weight:  54.7 kg    Height:        Intake/Output Summary (Last 24 hours) at  07/14/2021 1114 Last data filed at 07/14/2021 1015 Gross per 24 hour  Intake 692 ml  Output 200 ml  Net 492 ml   Filed Weights   07/12/21 2023 07/13/21 0104 07/14/21 0500  Weight: 55.1 kg 55.1 kg 54.7 kg    Examination:   General: Not in pain or dyspnea, deconditioned  Neurology: Awake and alert, non focal  E ENT: mild pallor, no icterus, oral mucosa moist Cardiovascular: No JVD. S1-S2 present, rhythmic, no gallops, rubs, or murmurs. No lower extremity edema. Pulmonary: positive breath sounds bilaterally, no wheezing, but bilateral rhonchi with no frank rales. Gastrointestinal. Abdomen soft and non tender Skin. No rashes Musculoskeletal: no joint deformities     Data Reviewed: I have personally reviewed following labs and imaging studies  CBC: Recent Labs  Lab 07/12/21 0957  WBC 7.2  HGB 11.1*  HCT 36.3  MCV 93.3  PLT 341   Basic Metabolic Panel: Recent Labs  Lab 07/12/21 0957 07/13/21 0518 07/14/21 0541  NA 139 142 138  K 4.0 3.6 3.3*  CL 108 103 101  CO2 22 28 28   GLUCOSE 91 152* 121*  BUN 21 19 24*  CREATININE 0.83 0.95 0.90  CALCIUM 8.2* 8.3* 8.5*   GFR: Estimated Creatinine Clearance: 40.9 mL/min (by C-G formula based on SCr of 0.9 mg/dL). Liver Function Tests: No results for input(s): AST, ALT, ALKPHOS, BILITOT, PROT, ALBUMIN in the last 168 hours. No results for input(s): LIPASE, AMYLASE in the last 168 hours. No results for input(s): AMMONIA in the last 168 hours. Coagulation Profile: No results for input(s): INR, PROTIME in the last 168 hours. Cardiac Enzymes: No results for input(s): CKTOTAL, CKMB, CKMBINDEX, TROPONINI in the last 168 hours. BNP (last 3 results) No results for input(s): PROBNP in the last 8760 hours. HbA1C: No results for input(s): HGBA1C in the last 72 hours. CBG: Recent Labs  Lab 07/12/21 1747 07/13/21 2135  GLUCAP 122* 135*   Lipid Profile: No results for input(s): CHOL, HDL, LDLCALC, TRIG, CHOLHDL, LDLDIRECT in  the last 72 hours. Thyroid Function Tests: No results for input(s): TSH, T4TOTAL, FREET4, T3FREE, THYROIDAB in the last 72 hours. Anemia Panel: No results for input(s): VITAMINB12, FOLATE, FERRITIN, TIBC, IRON, RETICCTPCT in the last 72 hours.    Radiology Studies: I have reviewed all of the imaging during this hospital visit personally     Scheduled Meds:  amiodarone  100 mg Oral Daily   apixaban  2.5 mg Oral BID   doxycycline  100 mg Oral Q12H   furosemide  40 mg Oral Daily   ipratropium-albuterol  3 mL Nebulization TID   levothyroxine  112 mcg Oral Once per day on Mon Tue Thu Fri Sat   levothyroxine  168 mcg Oral Once per day on Sun Wed   losartan  25 mg Oral QHS   metoprolol succinate  25 mg Oral Daily   mometasone-formoterol  2 puff Inhalation BID   potassium chloride  40 mEq Oral Once   simethicone  80 mg Oral Once   Continuous Infusions:  cefTRIAXone (  ROCEPHIN)  IV 1 g (07/13/21 1349)     LOS: 1 day        Victorious Cosio Gerome Apley, MD

## 2021-07-14 NOTE — Evaluation (Signed)
Occupational Therapy Evaluation Patient Details Name: Heidi Adams MRN: [id number] DOB: 1936/01/05 Today's Date: 07/14/2021   History of Present Illness 85 year old female admitted to the hospital with diagnosis of acute influenza A complicated with R lower lobe community acquired pneumonia and acute decompensation of chronic systolic heart failure.  PMH significant forleft breast cancer, chronic anemia, systolic heart failure, HTN, and hypothyroid.   Clinical Impression   Chart reviewed, RN cleared pt for participation in OT evaluation. Pt greeted in bed, A&Ox4, agreeable to evaluation. PTA pt lives at home with her husband who provides assist with ADL/IADL as needed.  CGA required for functional mobility, grooming tasks completed the sink. MIN A for UB bathing/dressing required. MAX A required for LB Dressing and bathing, which pt reports is baseline. Good safety awareness noted throughout. Pt presents with generalized weakness, affecting optimal safe ADL completion. OT recommends discharge home with HHOT to address functional deficits, facilitate safe ADL completion in the home. Pt is left in bedside chair, NAD, all needs met. OT will continue to follow while admitted.      Recommendations for follow up therapy are one component of a multi-disciplinary discharge planning process, led by the attending physician.  Recommendations may be updated based on patient status, additional functional criteria and insurance authorization.   Follow Up Recommendations  Home health OT    Assistance Recommended at Discharge Intermittent Supervision/Assistance  Functional Status Assessment  Patient has had a recent decline in their functional status and demonstrates the ability to make significant improvements in function in a reasonable and predictable amount of time.  Equipment Recommendations  Tub/shower seat    Recommendations for Other Services       Precautions / Restrictions  Precautions Precautions: Fall Restrictions Weight Bearing Restrictions: No      Mobility Bed Mobility Overal bed mobility: Needs Assistance Bed Mobility: Supine to Sit     Supine to sit: Supervision;HOB elevated          Transfers Overall transfer level: Needs assistance Equipment used: Rolling walker (2 wheels) Transfers: Sit to/from Stand Sit to Stand: Min guard                  Balance Overall balance assessment: Needs assistance Sitting-balance support: Feet supported;No upper extremity supported Sitting balance-Leahy Scale: Good     Standing balance support: Reliant on assistive device for balance;During functional activity Standing balance-Leahy Scale: Fair                             ADL either performed or assessed with clinical judgement   ADL Overall ADL's : Needs assistance/impaired Eating/Feeding: Set up   Grooming: Wash/dry hands;Wash/dry face;Oral care;Applying deodorant;Min guard;Standing   Upper Body Bathing: Minimal assistance;Sitting   Lower Body Bathing: Moderate assistance;Sit to/from stand   Upper Body Dressing : Minimal assistance   Lower Body Dressing: Maximal assistance;Sit to/from stand Lower Body Dressing Details (indicate cue type and reason): socks, underwear Toilet Transfer: Min guard;Grab bars;Rolling walker (2 wheels)   Toileting- Clothing Manipulation and Hygiene: Minimal assistance;Sit to/from stand       Functional mobility during ADLs: Min guard;Rolling walker (2 wheels) General ADL Comments: pt reports performing ADL below PLOF     Vision Baseline Vision/History: 1 Wears glasses Patient Visual Report: No change from baseline       Perception     Praxis      Pertinent Vitals/Pain Pain Assessment: No/denies pain  Hand Dominance     Extremity/Trunk Assessment Upper Extremity Assessment Upper Extremity Assessment: Generalized weakness   Lower Extremity Assessment Lower Extremity  Assessment: Generalized weakness   Cervical / Trunk Assessment Cervical / Trunk Assessment: Normal   Communication Communication Communication: No difficulties   Cognition Arousal/Alertness: Awake/alert Behavior During Therapy: WFL for tasks assessed/performed Overall Cognitive Status: Within Functional Limits for tasks assessed                                 General Comments: alert and oriented x4, good historian     General Comments       Exercises Other Exercises Other Exercises: education re: role of OT, role of rehab, importance of OOB mobility, safe use of RW, safe use of AE at home   Shoulder Instructions      Home Living Family/patient expects to be discharged to:: Private residence Living Arrangements: Spouse/significant other Available Help at Discharge: Family;Available 24 hours/day Type of Home: House Home Access: Stairs to enter [employer] of Steps: 2 Entrance Stairs-Rails: None Home Layout: One level     Bathroom Shower/Tub: Occupational psychologist: Standard Bathroom Accessibility: Yes   Home Equipment: Conservation officer, nature (2 wheels)   Additional Comments: pt reports potential use of built in chair in the shower however has a concern for slipping on chair      Prior Functioning/Environment Prior Level of Function : Independent/Modified Independent             Mobility Comments: use of RW PRN in home and community distances ADLs Comments: assit as needed with ADL/IADL from husband (socks/shoes, washing back in the shower, cooking/cleaning)        OT Problem List: Impaired balance (sitting and/or standing);Decreased strength;Decreased activity tolerance      OT Treatment/Interventions: Self-care/ADL training;DME and/or AE instruction;Therapeutic activities;Balance training;Therapeutic exercise;Patient/family education    OT Goals(Current goals can be found in the care plan section) Acute Rehab OT Goals Patient  Stated Goal: to go home OT Goal Formulation: With patient Time For Goal Achievement: 07/27/21 Potential to Achieve Goals: Good ADL Goals Pt Will Perform Grooming: with modified independence;standing Pt Will Transfer to Toilet: with modified independence Pt Will Perform Toileting - Clothing Manipulation and hygiene: with modified independence  OT Frequency: Min 2X/week   Barriers to D/C:            Co-evaluation              AM-PAC OT "6 Clicks" Daily Activity     Outcome Measure Help from another person eating meals?: None Help from another person taking care of personal grooming?: A Little Help from another person toileting, which includes using toliet, bedpan, or urinal?: A Little Help from another person bathing (including washing, rinsing, drying)?: A Little Help from another person to put on and taking off regular upper body clothing?: A Little Help from another person to put on and taking off regular lower body clothing?: A Lot 6 Click Score: 18   End of Session Equipment Utilized During Treatment: Gait belt;Rolling walker (2 wheels) Nurse Communication: Mobility status  Activity Tolerance: Patient tolerated treatment well Patient left: in chair;with call bell/phone within reach;with chair alarm set  OT Visit Diagnosis: Unsteadiness on feet (R26.81);Other abnormalities of gait and mobility (R26.89)                Time: 2694-8546 OT Time Calculation (min): 45 min Charges:  OT General Charges $OT Visit: 1 Visit OT Evaluation $OT Eval Low Complexity: 1 Low OT Treatments $Self Care/Home Management : 23-37 mins Shanon Payor, OTD OTR/L  07/14/21, 10:25 AM

## 2021-07-15 LAB — BASIC METABOLIC PANEL
Anion gap: 9 (ref 5–15)
BUN: 26 mg/dL — ABNORMAL HIGH (ref 8–23)
CO2: 27 mmol/L (ref 22–32)
Calcium: 8.5 mg/dL — ABNORMAL LOW (ref 8.9–10.3)
Chloride: 100 mmol/L (ref 98–111)
Creatinine, Ser: 0.99 mg/dL (ref 0.44–1.00)
GFR, Estimated: 57 mL/min — ABNORMAL LOW (ref >60)
Glucose, Bld: 119 mg/dL — ABNORMAL HIGH (ref 70–99)
Potassium: 3.4 mmol/L — ABNORMAL LOW (ref 3.5–5.1)
Sodium: 136 mmol/L (ref 135–145)

## 2021-07-15 MED ORDER — MOMETASONE FURO-FORMOTEROL FUM 200-5 MCG/ACT IN AERO
2.0000 | INHALATION_SPRAY | Freq: Two times a day (BID) | RESPIRATORY_TRACT | 0 refills | Status: DC
Start: 2021-07-15 — End: 2021-09-23

## 2021-07-15 MED ORDER — IPRATROPIUM-ALBUTEROL 0.5-2.5 (3) MG/3ML IN SOLN: 3.0000 mL | Freq: Four times a day (QID) | RESPIRATORY_TRACT | 0 refills | Status: DC | PRN

## 2021-07-15 MED ORDER — PREDNISONE 20 MG PO TABS: 20.0000 mg | ORAL_TABLET | Freq: Every day | ORAL | 0 refills | Status: AC

## 2021-07-15 NOTE — Discharge Summary (Signed)
Physician Discharge Summary  [name] MLY:[id number] DOB: 08-12-1935 DOA: 07/12/2021  PCP: [name], MD  Admit date: 07/12/2021 Discharge date: 07/15/2021  Admitted From: Home  Disposition:  Home  Recommendations for Outpatient Follow-up and new medication changes:  Follow up with Dr. Virgina Jock in 7 to 10 days Patient will continue prednisone for 3 more days Placed on inhaled corticosteroids for 15 days Recommendation for outpatient full pulmonary function testing  Continue bronchodilator therapy with duoneb as needed.   Home Health: no   Equipment/Devices:nebulizer machine    Discharge Condition: stable  CODE STATUS: DNR   Diet recommendation:  heart healthy   Brief/Interim Summary: Heidi Adams was admitted to the hospital with the working diagnosis of acute influenza A, complicated with acute decompensation of chronic systolic heart failure and COPD exacerbation.    85 yo female with the past medical history of left breast cancer, chronic anemia, systolic heart failure, HTN, and hypothyroid who presented with dyspnea. She was diagnosed with influenza A on 12/16 her symptoms consisted in fatigue, malaise, dry cough and congestion. She received oseltamivir and moxifloxacine as outpatient, but  for the last 2 days before her hospitalization her symptoms worsened with more severe dyspnea and wheezing. Positive tobacco abuse for 15 to 20 years, not diagnosis of COPD. On her initial physical examination her blood pressure was 183/92, HR 66, RR 20, temp 97,8 and oxygen saturation 98% on room air.   Acutely ill appearing, lungs with bilateral wheezing, heart S1 and S2 present and tachycardic, abdomen was soft and non tender, no lower extremity edema.   Na 139, K 4,0, Cl 108,. Bicarbonate 22, glucose 91, BUN 21 and serum cr at 0,83.  BNP 2,073 Wbc 7,2, hgb 11.1 hvt 36,3 and plt 250  SARS COVID 19 negative, influenza B negative, influenza A positive.    Chest film with hyperinflation,  right lower lobe interstitial infiltrate, with positive hilar vascular congestion, positive cardiomegaly.    EKG 66 bpm, left axis deviation, left anterior fascicular block, sinus rhythm with pac, normal intervals, St segment and T wave inversion in V6, positive LVH.    Patient placed on IV diuretics and bronchodilator therapy. Follow up chest film with resolution of right lower lobe interstitial infiltrate, and pneumonia was ruled out.  Patient clinically improving, will continue with steroids and bronchodilator therapy at home, follow up with Dr Virgina Jock in 7 to 10 days.   Acute influenza A complicated with acute COPD exacerbation. Patient was admitted to the medical ward, she received bronchodilator therapy, systemic and inhaled corticosteroids. Antitussive agents and airway clearing techniques with flutter valve and incentive spirometer.  Oral doxycyline for airway inflammation.   Completed 5 days of antiviral therapy with Oseltamivir.  Pneumonia was ruled out with a follow up chest radiograph.   Her symptoms continue to improve, continue with as needed bronchodilator therapy, inhaled steroids and 3 more days of oral prednisone.  Out patient full pulmonary function testing.   2. Acute on chronic systolic heart failure decompensation. Paroxysmal atrial fibrillation. HTN Positive signs of hypervolemia, patient was diuresed with furosemide with good toleration. Continue with losartan and metoprolol for heart failure regimen.   Plan to continue with amiodarone for rate and rhythm control. Apixaban for anticoagulation.   3. Tobacco abuse  Smoking cessation counseling   4. Hypothyroid. Continue with levothyroxine   5. Anemia of chronic disease. Her hgb and hct remained stable. Plan to follow up as outpatient.   Discharge Diagnoses:  Principal Problem:  Influenza A Active Problems:   Hypothyroidism   AF (paroxysmal atrial fibrillation) (HCC)   Acute on chronic systolic CHF  (congestive heart failure) (HCC)   Hypocalcemia   Normocytic anemia   GERD (gastroesophageal reflux disease)    Discharge Instructions  Discharge Instructions     Diet - low sodium heart healthy   Complete by: As directed    Discharge instructions   Complete by: As directed    Follow up with primary care in 7 to 10 days.   For home use only DME Nebulizer machine   Complete by: As directed    Patient needs a nebulizer to treat with the following condition: Bronchitis   Length of Need: 6 Months   Increase activity slowly   Complete by: As directed       Allergies as of 07/15/2021       Reactions   Codeine Nausea Only   Epinephrine    Tachcardyia, chest pains tremors   Hydrocodone-acetaminophen Other (See Comments)   Tenormin [atenolol] Rash        Medication List     STOP taking these medications    meclizine 25 MG tablet Commonly known as: ANTIVERT   moxifloxacin 400 MG tablet Commonly known as: AVELOX   oseltamivir 75 MG capsule Commonly known as: TAMIFLU       TAKE these medications    acetaminophen 500 MG tablet Commonly known as: TYLENOL Take 1,000 mg by mouth 3 (three) times daily as needed for moderate pain.   albuterol 108 (90 Base) MCG/ACT inhaler Commonly known as: VENTOLIN HFA 1 puff every 6 (six) hours as needed for wheezing or shortness of breath.   amiodarone 200 MG tablet Commonly known as: PACERONE TAKE 1 TABLET BY MOUTH EVERY DAY What changed: how much to take   apixaban 2.5 MG Tabs tablet Commonly known as: ELIQUIS Take 1 tablet (2.5 mg total) by mouth 2 (two) times daily.   carboxymethylcellulose 0.5 % Soln Commonly known as: REFRESH PLUS Place 1 drop into both eyes daily as needed (dry eyes).   Centrum Minis Women 50+ Tabs Take 1 tablet by mouth daily.   furosemide 40 MG tablet Commonly known as: LASIX Take daily if weight up by 3 Lbs or shortness of breath or swelling. Take 40 mg once per day 2 times per week.    ipratropium-albuterol 0.5-2.5 (3) MG/3ML Soln Commonly known as: DUONEB Take 3 mLs by nebulization every 6 (six) hours as needed (shortness of breath or wheezing).   levothyroxine 112 MCG tablet Commonly known as: SYNTHROID Take 112-168 mcg by mouth daily before breakfast. Take 1 tablet (112 mcg) on Mondays, Tuesdays,, Thursdays, Fridays & Saturdays. Take 1.5 tablet (168 mcg) on Sundays & Wednesday   losartan 50 MG tablet Commonly known as: COZAAR TAKE 1 TABLET BY MOUTH EACH NIGHT AT BEDTIME What changed: See the new instructions.   metoprolol succinate 50 MG 24 hr tablet Commonly known as: TOPROL-XL Take 25 mg by mouth daily. Take with or immediately following a meal.   mometasone-formoterol 200-5 MCG/ACT Aero Commonly known as: DULERA Inhale 2 puffs into the lungs 2 (two) times daily for 15 days.   pantoprazole 40 MG tablet Commonly known as: PROTONIX Take 40 mg by mouth daily as needed (indigestion/heartburn.).   potassium chloride 10 MEQ tablet Commonly known as: KLOR-CON M Take 2 tablets (20 mEq total) by mouth every other day. Take every time with furosemide What changed:  when to take this additional instructions  predniSONE 20 MG tablet Commonly known as: DELTASONE Take 1 tablet (20 mg total) by mouth daily with breakfast for 3 days. Start taking on: July 16, 2021               Durable Medical Equipment  (From admission, onward)           Start     Ordered   07/15/21 0000  For home use only DME Nebulizer machine       Question Answer Comment  Patient needs a nebulizer to treat with the following condition Bronchitis   Length of Need 6 Months      07/15/21 1014            Allergies  Allergen Reactions   Codeine Nausea Only   Epinephrine     Tachcardyia, chest pains tremors   Hydrocodone-Acetaminophen Other (See Comments)   Tenormin [Atenolol] Rash      Procedures/Studies: DG Chest 1 View  Result Date: 07/14/2021 CLINICAL  DATA:  Dyspnea EXAM: CHEST  1 VIEW COMPARISON:  Chest x-ray dated July 12, 2021 FINDINGS: Patient is rotated to the right. Cardiac contours are unchanged enlarged. No focal consolidation. No large pleural effusion or pneumothorax. IMPRESSION: No active disease. Electronically Signed   By: Yetta Glassman M.D.   On: 07/14/2021 08:48   DG Chest Port 1 View  Result Date: 07/12/2021 CLINICAL DATA:  Cough, shortness of breath EXAM: PORTABLE CHEST 1 VIEW COMPARISON:  02/06/2021 FINDINGS: Cardiomegaly with vascular congestion. Patchy opacities in the right mid and lower lung. No focal opacity on the left. No overt edema. No effusions or acute bony abnormality. IMPRESSION: Cardiomegaly, vascular congestion. Patchy opacities in the right mid and lower lung concerning for pneumonia. Electronically Signed   By: Rolm Baptise M.D.   On: 07/12/2021 09:50       Subjective: Patient is feeling better, dyspnea continue to improve, no chest pain, no nausea or vomiting, continue to have intermittent wheezing but much improved, compared to yesterday   Discharge Exam: Vitals:   07/15/21 0425 07/15/21 0819  BP: (!) 168/84   Pulse: 71   Resp: 17   Temp: 97.8 F (36.6 C)   SpO2: 94% 94%   Vitals:   07/14/21 2104 07/15/21 0425 07/15/21 0500 07/15/21 0819  BP: (!) 168/85 (!) 168/84    Pulse: 71 71    Resp: 20 17    Temp: 97.9 F (36.6 C) 97.8 F (36.6 C)    TempSrc:      SpO2: 95% 94%  94%  Weight:   52 kg   Height:        General: Not in pain or dyspnea  Neurology: Awake and alert, non focal  E ENT: no pallor, no icterus, oral mucosa moist Cardiovascular: No JVD. S1-S2 present, rhythmic, no gallops, rubs, or murmurs. No lower extremity edema. Pulmonary: positive breath sounds bilaterally,  with wheezing, rhonchi or rales. Gastrointestinal. Abdomen soft and non tender Skin. No rashes Musculoskeletal: no joint deformities   The results of significant diagnostics from this hospitalization  (including imaging, microbiology, ancillary and laboratory) are listed below for reference.     Microbiology: Recent Results (from the past 240 hour(s))  Resp Panel by RT-PCR (Flu A&B, Covid) Nasopharyngeal Swab     Status: Abnormal   Collection Time: 07/12/21  9:48 AM   Specimen: Nasopharyngeal Swab; Nasopharyngeal(NP) swabs in vial transport medium  Result Value Ref Range Status   SARS Coronavirus 2 by RT PCR NEGATIVE NEGATIVE Final  Comment: (NOTE) SARS-CoV-2 target nucleic acids are NOT DETECTED.  The SARS-CoV-2 RNA is generally detectable in upper respiratory specimens during the acute phase of infection. The lowest concentration of SARS-CoV-2 viral copies this assay can detect is 138 copies/mL. A negative result does not preclude SARS-Cov-2 infection and should not be used as the sole basis for treatment or other patient management decisions. A negative result may occur with  improper specimen collection/handling, submission of specimen other than nasopharyngeal swab, presence of viral mutation(s) within the areas targeted by this assay, and inadequate number of viral copies(<138 copies/mL). A negative result must be combined with clinical observations, patient history, and epidemiological information. The expected result is Negative.  Fact Sheet for Patients:  EntrepreneurPulse.com.au  Fact Sheet for Healthcare Providers:  IncredibleEmployment.be  This test is no t yet approved or cleared by the [place] FDA and  has been authorized for detection and/or diagnosis of SARS-CoV-2 by FDA under an Emergency Use Authorization (EUA). This EUA will remain  in effect (meaning this test can be used) for the duration of the COVID-19 declaration under Section 564(b)(1) of the Act, 21 U.S.C.section 360bbb-3(b)(1), unless the authorization is terminated  or revoked sooner.       Influenza A by PCR POSITIVE (A) NEGATIVE Final   Influenza B  by PCR NEGATIVE NEGATIVE Final    Comment: (NOTE) The Xpert Xpress SARS-CoV-2/FLU/RSV plus assay is intended as an aid in the diagnosis of influenza from Nasopharyngeal swab specimens and should not be used as a sole basis for treatment. Nasal washings and aspirates are unacceptable for Xpert Xpress SARS-CoV-2/FLU/RSV testing.  Fact Sheet for Patients: EntrepreneurPulse.com.au  Fact Sheet for Healthcare Providers: IncredibleEmployment.be  This test is not yet approved or cleared by the [place] FDA and has been authorized for detection and/or diagnosis of SARS-CoV-2 by FDA under an Emergency Use Authorization (EUA). This EUA will remain in effect (meaning this test can be used) for the duration of the COVID-19 declaration under Section 564(b)(1) of the Act, 21 U.S.C. section 360bbb-3(b)(1), unless the authorization is terminated or revoked.  Performed at [facility], [place] [address], [place], [place] [address]      Labs: BNP (last 3 results) Recent Labs    09/09/20 1320 02/06/21 0828 07/12/21 0957  BNP 1,136.5* 1,741.5* 0,174.9*   Basic Metabolic Panel: Recent Labs  Lab 07/12/21 0957 07/13/21 0518 07/14/21 0541 07/15/21 0447  NA 139 142 138 136  K 4.0 3.6 3.3* 3.4*  CL 108 103 101 100  CO2 22 28 28 27   GLUCOSE 91 152* 121* 119*  BUN 21 19 24* 26*  CREATININE 0.83 0.95 0.90 0.99  CALCIUM 8.2* 8.3* 8.5* 8.5*   Liver Function Tests: No results for input(s): AST, ALT, ALKPHOS, BILITOT, PROT, ALBUMIN in the last 168 hours. No results for input(s): LIPASE, AMYLASE in the last 168 hours. No results for input(s): AMMONIA in the last 168 hours. CBC: Recent Labs  Lab 07/12/21 0957  WBC 7.2  HGB 11.1*  HCT 36.3  MCV 93.3  PLT 250   Cardiac Enzymes: No results for input(s): CKTOTAL, CKMB, CKMBINDEX, TROPONINI in the last 168 hours. BNP: Invalid input(s): POCBNP CBG: Recent Labs  Lab 07/12/21 1747  07/13/21 2135  GLUCAP 122* 135*   D-Dimer No results for input(s): DDIMER in the last 72 hours. Hgb A1c No results for input(s): HGBA1C in the last 72 hours. Lipid Profile No results for input(s): CHOL, HDL, LDLCALC, TRIG, CHOLHDL, LDLDIRECT in the last  72 hours. Thyroid function studies No results for input(s): TSH, T4TOTAL, T3FREE, THYROIDAB in the last 72 hours.  Invalid input(s): FREET3 Anemia work up No results for input(s): VITAMINB12, FOLATE, FERRITIN, TIBC, IRON, RETICCTPCT in the last 72 hours. Urinalysis    Component Value Date/Time   COLORURINE YELLOW (A) 04/25/2021 1908   APPEARANCEUR HAZY (A) 04/25/2021 1908   LABSPEC 1.010 04/25/2021 1908   PHURINE 7.0 04/25/2021 1908   GLUCOSEU NEGATIVE 04/25/2021 1908   HGBUR TRACE (A) 04/25/2021 1908   BILIRUBINUR NEGATIVE 04/25/2021 1908   KETONESUR NEGATIVE 04/25/2021 1908   PROTEINUR 100 (A) 04/25/2021 1908   NITRITE NEGATIVE 04/25/2021 1908   LEUKOCYTESUR SMALL (A) 04/25/2021 1908   Sepsis Labs Invalid input(s): PROCALCITONIN,  WBC,  LACTICIDVEN Microbiology Recent Results (from the past 240 hour(s))  Resp Panel by RT-PCR (Flu A&B, Covid) Nasopharyngeal Swab     Status: Abnormal   Collection Time: 07/12/21  9:48 AM   Specimen: Nasopharyngeal Swab; Nasopharyngeal(NP) swabs in vial transport medium  Result Value Ref Range Status   SARS Coronavirus 2 by RT PCR NEGATIVE NEGATIVE Final    Comment: (NOTE) SARS-CoV-2 target nucleic acids are NOT DETECTED.  The SARS-CoV-2 RNA is generally detectable in upper respiratory specimens during the acute phase of infection. The lowest concentration of SARS-CoV-2 viral copies this assay can detect is 138 copies/mL. A negative result does not preclude SARS-Cov-2 infection and should not be used as the sole basis for treatment or other patient management decisions. A negative result may occur with  improper specimen collection/handling, submission of specimen other than  nasopharyngeal swab, presence of viral mutation(s) within the areas targeted by this assay, and inadequate number of viral copies(<138 copies/mL). A negative result must be combined with clinical observations, patient history, and epidemiological information. The expected result is Negative.  Fact Sheet for Patients:  EntrepreneurPulse.com.au  Fact Sheet for Healthcare Providers:  IncredibleEmployment.be  This test is no t yet approved or cleared by the [place] FDA and  has been authorized for detection and/or diagnosis of SARS-CoV-2 by FDA under an Emergency Use Authorization (EUA). This EUA will remain  in effect (meaning this test can be used) for the duration of the COVID-19 declaration under Section 564(b)(1) of the Act, 21 U.S.C.section 360bbb-3(b)(1), unless the authorization is terminated  or revoked sooner.       Influenza A by PCR POSITIVE (A) NEGATIVE Final   Influenza B by PCR NEGATIVE NEGATIVE Final    Comment: (NOTE) The Xpert Xpress SARS-CoV-2/FLU/RSV plus assay is intended as an aid in the diagnosis of influenza from Nasopharyngeal swab specimens and should not be used as a sole basis for treatment. Nasal washings and aspirates are unacceptable for Xpert Xpress SARS-CoV-2/FLU/RSV testing.  Fact Sheet for Patients: EntrepreneurPulse.com.au  Fact Sheet for Healthcare Providers: IncredibleEmployment.be  This test is not yet approved or cleared by the [place] FDA and has been authorized for detection and/or diagnosis of SARS-CoV-2 by FDA under an Emergency Use Authorization (EUA). This EUA will remain in effect (meaning this test can be used) for the duration of the COVID-19 declaration under Section 564(b)(1) of the Act, 21 U.S.C. section 360bbb-3(b)(1), unless the authorization is terminated or revoked.  Performed at [facility], [place] [address] [place], [place] [address]      Time coordinating discharge: 45 minutes  SIGNED:   Tawni Millers, MD  [facility] 07/15/2021, 10:14 AM

## 2021-07-15 NOTE — Progress Notes (Signed)
Pt discharged home, discharge instructions provided, pt verbalized understanding.

## 2021-07-15 NOTE — TOC Transition Note (Signed)
Transition of Care [facility]) - CM/SW Discharge Note   Patient Details  Name: Heidi Adams MRN: [id number] Date of Birth: [birth year]  [facility] CM/SW Contact:  Trish Mage, LCSW Phone Number: 07/15/2021, 10:27 AM   Clinical Narrative:   Patient who is stable for d/c is in need of DME nebulizer, resumption orders for [facility] RN, PT as she has been getting services through [facility].  Contacted Genevieve with [facility] for delivery of nebulizer.  Orders seen and appreciated.  No further needs identified. TOC sign off.    Final next level of care: [place] Barriers to Discharge: No Barriers Identified   Patient Goals and CMS Choice        Discharge Placement                       Discharge Plan and Services                                     Social Determinants of Health (SDOH) Interventions     Readmission Risk Interventions No flowsheet data found.

## 2021-07-16 ENCOUNTER — Other Ambulatory Visit (HOSPITAL_COMMUNITY): Payer: Self-pay

## 2021-07-16 DIAGNOSIS — N390 Urinary tract infection, site not specified: Secondary | ICD-10-CM | POA: Diagnosis not present

## 2021-07-16 DIAGNOSIS — R42 Dizziness and giddiness: Secondary | ICD-10-CM | POA: Diagnosis not present

## 2021-07-22 DIAGNOSIS — Z66 Do not resuscitate: Secondary | ICD-10-CM | POA: Diagnosis not present

## 2021-07-22 DIAGNOSIS — Z515 Encounter for palliative care: Secondary | ICD-10-CM | POA: Diagnosis not present

## 2021-07-22 DIAGNOSIS — I5020 Unspecified systolic (congestive) heart failure: Secondary | ICD-10-CM | POA: Diagnosis not present

## 2021-07-23 ENCOUNTER — Other Ambulatory Visit: Payer: Self-pay

## 2021-07-23 NOTE — Progress Notes (Signed)
error 

## 2021-07-26 NOTE — Progress Notes (Signed)
Patient Care Team: [name], MD as PCP - General (Internal Medicine)  DIAGNOSIS:    ICD-10-CM   1. Malignant neoplasm of upper-inner quadrant of left breast in female, estrogen receptor positive ([place])  C50.212    Z17.0       SUMMARY OF ONCOLOGIC HISTORY: Oncology History  Malignant neoplasm of upper-inner quadrant of left breast in female, estrogen receptor positive ([place])  05/15/2017 Initial Diagnosis   Malignant neoplasm of upper-inner quadrant of left breast in female, estrogen receptor positive ([place]): Microscopic focus suspicious for IDC low-grade   05/30/2017 Surgery   Left lumpectomy: IDC grade 1, 0.8 cm, with low-grade DCIS, ER 100%, PR 50%, Ki-67 5%, HER-2 negative ratio 1.5, T1BN0 stage I a    Miscellaneous   Refused radiation and Anti-estrogen therapy     CHIEF COMPLIANT: Follow-up of breast cancer and iron deficiency anemia  INTERVAL HISTORY: Heidi Adams is a 86 y.o. with above-mentioned history of breast cancer treated with lumpectomy, she refused radiation and antiestrogen therapy. She is currently on surveillance. She presents to the clinic today for follow-up.  She was also being followed for iron deficiency anemia.  She received IV iron about a year ago.  She has not required any IV iron at this time.  She has been hospitalized a couple of times over the past year but her hemoglobin has remained fairly well controlled.  ALLERGIES:  is allergic to codeine, epinephrine, hydrocodone-acetaminophen, and tenormin [atenolol].  MEDICATIONS:  Current Outpatient Medications  Medication Sig Dispense Refill   acetaminophen (TYLENOL) 500 MG tablet Take 1,000 mg by mouth 3 (three) times daily as needed for moderate pain.      albuterol (VENTOLIN HFA) 108 (90 Base) MCG/ACT inhaler 1 puff every 6 (six) hours as needed for wheezing or shortness of breath.     amiodarone (PACERONE) 200 MG tablet TAKE 1 TABLET BY MOUTH EVERY DAY (Patient taking differently: Take 100 mg by mouth  daily.) 90 tablet 1   apixaban (ELIQUIS) 2.5 MG TABS tablet Take 1 tablet (2.5 mg total) by mouth 2 (two) times daily. 60 tablet 3   carboxymethylcellulose (REFRESH PLUS) 0.5 % SOLN Place 1 drop into both eyes daily as needed (dry eyes).     furosemide (LASIX) 40 MG tablet Take daily if weight up by 3 Lbs or shortness of breath or swelling. Take 40 mg once per day 2 times per week. 90 tablet 1   ipratropium-albuterol (DUONEB) 0.5-2.5 (3) MG/3ML SOLN Take 3 mLs by nebulization every 6 (six) hours as needed (shortness of breath or wheezing). 360 mL 0   levothyroxine (SYNTHROID, LEVOTHROID) 112 MCG tablet Take 112-168 mcg by mouth daily before breakfast. Take 1 tablet (112 mcg) on Mondays, Tuesdays,, Thursdays, Fridays & Saturdays. Take 1.5 tablet (168 mcg) on Sundays & Wednesday     losartan (COZAAR) 50 MG tablet TAKE 1 TABLET BY MOUTH EACH NIGHT AT BEDTIME (Patient taking differently: Take 25 mg by mouth at bedtime.) 90 tablet 1   metoprolol succinate (TOPROL-XL) 50 MG 24 hr tablet Take 25 mg by mouth daily. Take with or immediately following a meal.     mometasone-formoterol (DULERA) 200-5 MCG/ACT AERO Inhale 2 puffs into the lungs 2 (two) times daily for 15 days. 1 each 0   Multiple Vitamins-Minerals (CENTRUM MINIS WOMEN 50+) TABS Take 1 tablet by mouth daily.     pantoprazole (PROTONIX) 40 MG tablet Take 40 mg by mouth daily as needed (indigestion/heartburn.).      potassium chloride (  KLOR-CON) 10 MEQ tablet Take 2 tablets (20 mEq total) by mouth every other day. Take every time with furosemide (Patient taking differently: Take 20 mEq by mouth every 3 (three) days. Taking every three days with furosemide) 180 tablet 1   No current facility-administered medications for this visit.    PHYSICAL EXAMINATION: ECOG PERFORMANCE STATUS: 1 - Symptomatic but completely ambulatory  Vitals:   07/27/21 1033  BP: (!) 161/74  Pulse: 65  Resp: 16  Temp: (!) 97.5 F (36.4 C)  SpO2: 100%   Filed Weights    07/27/21 1033  Weight: 114 lb 3.2 oz (51.8 kg)    BREAST: No palpable masses or nodules in either right or left breasts. No palpable axillary supraclavicular or infraclavicular adenopathy no breast tenderness or nipple discharge. (exam performed in the presence of a chaperone)  LABORATORY DATA:  I have reviewed the data as listed CMP Latest Ref Rng & Units 07/15/2021 07/14/2021 07/13/2021  Glucose 70 - 99 mg/dL 119(H) 121(H) 152(H)  BUN 8 - 23 mg/dL 26(H) 24(H) 19  Creatinine 0.44 - 1.00 mg/dL 0.99 0.90 0.95  Sodium 135 - 145 mmol/L 136 138 142  Potassium 3.5 - 5.1 mmol/L 3.4(L) 3.3(L) 3.6  Chloride 98 - 111 mmol/L 100 101 103  CO2 22 - 32 mmol/L 27 28 28   Calcium 8.9 - 10.3 mg/dL 8.5(L) 8.5(L) 8.3(L)  Total Protein 6.5 - 8.1 g/dL - - -  Total Bilirubin 0.3 - 1.2 mg/dL - - -  Alkaline Phos 38 - 126 U/L - - -  AST 15 - 41 U/L - - -  ALT 0 - 44 U/L - - -    Lab Results  Component Value Date   WBC 6.9 07/27/2021   HGB 11.0 (L) 07/27/2021   HCT 35.8 (L) 07/27/2021   MCV 90.9 07/27/2021   PLT 243 07/27/2021   NEUTROABS 5.5 07/27/2021    ASSESSMENT & PLAN:  Malignant neoplasm of upper-inner quadrant of left breast in female, estrogen receptor positive ([place]) 05/30/2017: Left lumpectomy: IDC grade 1, 0.8 cm, with low-grade DCIS, ER 100%, PR 50%, Ki-67 5%, HER-2 negative ratio 1.5, T1BN0 stage I a   Did not receive adj XRT Patient also refused antiestrogen therapy   Iron deficiency anemia due to chronic blood loss Complex anemia due to blood loss and also anemia of chronic disease   Malenotic stools with microcytic anemia: Likely GI Bleed. Since 03/20/2020 Follows with gastroenterology with Dr. Cristina Gong Eliquis Discontinued Blood transfusion given 06/23/20 (hospitalization for A.Fib with RVR)   IV iron: Venofer December 2021 Lab review:   10/28/2020: Hemoglobin 11.4, iron saturation 13%, ferritin 116, CMP normal 01/26/2021: Hemoglobin 12.7, iron saturation 19%, ferritin 64, CMP  normal  07/12/2021: Hemoglobin 11.1 No immediate need for IV iron infusion   Severe fatigue: Partially due to hypothyroidism Hospitalization 07/12/2021-07/15/2021: Acute influenza A, acute decompensation of chronic CHF and COPD I reviewed the lab work with the patient and she does not need IV iron at this time.  Return to clinic on an as-needed basis   No orders of the defined types were placed in this encounter.  The patient has a good understanding of the overall plan. she agrees with it. she will call with any problems that may develop before the next visit here.  Total time spent: 20 mins including face to face time and time spent for planning, charting and coordination of care  Rulon Eisenmenger, MD, MPH 07/27/2021  I, Thana Ates, am acting as scribe  for Dr. Nicholas Lose.  I have reviewed the above documentation for accuracy and completeness, and I agree with the above.

## 2021-07-27 ENCOUNTER — Inpatient Hospital Stay: Payer: PPO | Attending: Hematology and Oncology

## 2021-07-27 ENCOUNTER — Inpatient Hospital Stay: Payer: PPO | Admitting: Hematology and Oncology

## 2021-07-27 ENCOUNTER — Other Ambulatory Visit: Payer: Self-pay

## 2021-07-27 DIAGNOSIS — Z170 Estrogen receptor positive status [ER+]: Secondary | ICD-10-CM | POA: Diagnosis not present

## 2021-07-27 DIAGNOSIS — Z7901 Long term (current) use of anticoagulants: Secondary | ICD-10-CM | POA: Diagnosis not present

## 2021-07-27 DIAGNOSIS — C50212 Malignant neoplasm of upper-inner quadrant of left female breast: Secondary | ICD-10-CM | POA: Diagnosis not present

## 2021-07-27 DIAGNOSIS — I480 Paroxysmal atrial fibrillation: Secondary | ICD-10-CM | POA: Insufficient documentation

## 2021-07-27 DIAGNOSIS — D509 Iron deficiency anemia, unspecified: Secondary | ICD-10-CM | POA: Insufficient documentation

## 2021-07-27 DIAGNOSIS — E039 Hypothyroidism, unspecified: Secondary | ICD-10-CM | POA: Insufficient documentation

## 2021-07-27 DIAGNOSIS — Z853 Personal history of malignant neoplasm of breast: Secondary | ICD-10-CM | POA: Diagnosis not present

## 2021-07-27 DIAGNOSIS — Z79899 Other long term (current) drug therapy: Secondary | ICD-10-CM | POA: Insufficient documentation

## 2021-07-27 LAB — CMP (CANCER CENTER ONLY)
ALT: 11 U/L (ref 0–44)
AST: 15 U/L (ref 15–41)
Albumin: 3.6 g/dL (ref 3.5–5.0)
Alkaline Phosphatase: 57 U/L (ref 38–126)
Anion gap: 7 (ref 5–15)
BUN: 22 mg/dL (ref 8–23)
CO2: 26 mmol/L (ref 22–32)
Calcium: 9 mg/dL (ref 8.9–10.3)
Chloride: 109 mmol/L (ref 98–111)
Creatinine: 0.94 mg/dL (ref 0.44–1.00)
GFR, Estimated: >60 mL/min (ref >60)
Glucose, Bld: 87 mg/dL (ref 70–99)
Potassium: 3.7 mmol/L (ref 3.5–5.1)
Sodium: 142 mmol/L (ref 135–145)
Total Bilirubin: 0.8 mg/dL (ref 0.3–1.2)
Total Protein: 6 g/dL — ABNORMAL LOW (ref 6.5–8.1)

## 2021-07-27 LAB — CBC WITH DIFFERENTIAL/PLATELET
Abs Immature Granulocytes: 0.03 10*3/uL (ref 0.00–0.07)
Basophils Absolute: 0 10*3/uL (ref 0.0–0.1)
Basophils Relative: 0 %
Eosinophils Absolute: 0.1 10*3/uL (ref 0.0–0.5)
Eosinophils Relative: 1 %
HCT: 35.8 % — ABNORMAL LOW (ref 36.0–46.0)
Hemoglobin: 11 g/dL — ABNORMAL LOW (ref 12.0–15.0)
Immature Granulocytes: 0 %
Lymphocytes Relative: 12 %
Lymphs Abs: 0.9 10*3/uL (ref 0.7–4.0)
MCH: 27.9 pg (ref 26.0–34.0)
MCHC: 30.7 g/dL (ref 30.0–36.0)
MCV: 90.9 fL (ref 80.0–100.0)
Monocytes Absolute: 0.3 10*3/uL (ref 0.1–1.0)
Monocytes Relative: 5 %
Neutro Abs: 5.5 10*3/uL (ref 1.7–7.7)
Neutrophils Relative %: 82 %
Platelets: 243 10*3/uL (ref 150–400)
RBC: 3.94 MIL/uL (ref 3.87–5.11)
RDW: 16.6 % — ABNORMAL HIGH (ref 11.5–15.5)
WBC: 6.9 10*3/uL (ref 4.0–10.5)
nRBC: 0 % (ref 0.0–0.2)

## 2021-07-27 LAB — IRON AND IRON BINDING CAPACITY (CC-WL,HP ONLY)
Iron: 30 ug/dL (ref 28–170)
Saturation Ratios: 10 % — ABNORMAL LOW (ref 10.4–31.8)
TIBC: 293 ug/dL (ref 250–450)
UIBC: 263 ug/dL (ref 148–442)

## 2021-07-27 LAB — FERRITIN: Ferritin: 80 ng/mL (ref 11–307)

## 2021-07-27 NOTE — Assessment & Plan Note (Signed)
05/30/2017:Left lumpectomy: IDC grade 1, 0.8 cm, with low-grade DCIS, ER 100%, PR 50%, Ki-67 5%, HER-2 negative ratio 1.5, T1BN0 stage I a  Did not receive adj XRT Patient also refused antiestrogen therapy  Iron deficiency anemia due to chronic blood loss Complex anemia due to blood loss and also anemia of chronic disease  Malenotic stools with microcytic anemia: Likely GI Bleed. Since 03/20/2020 Follows with gastroenterology with Dr. Cristina Gong Eliquis Discontinued Blood transfusion given 06/23/20 (hospitalization for A.Fib with RVR)  IV iron: Venofer December 2021 Lab review: 10/28/2020: Hemoglobin 11.4, iron saturation 13%, ferritin 116, CMP normal 01/26/2021: Hemoglobin 12.7, iron saturation 19%, ferritin 64, CMP normal  07/12/2021: Hemoglobin 11.1 No immediate need for IV iron infusion   Severe fatigue: Partially due to hypothyroidism Hospitalization 07/12/2021-07/15/2021: Acute influenza A, acute decompensation of chronic CHF and COPD

## 2021-07-28 DIAGNOSIS — I480 Paroxysmal atrial fibrillation: Secondary | ICD-10-CM | POA: Diagnosis not present

## 2021-07-28 DIAGNOSIS — I5032 Chronic diastolic (congestive) heart failure: Secondary | ICD-10-CM | POA: Diagnosis not present

## 2021-07-28 DIAGNOSIS — I10 Essential (primary) hypertension: Secondary | ICD-10-CM | POA: Diagnosis not present

## 2021-07-28 LAB — THYROID PANEL WITH TSH
Free Thyroxine Index: 2.9 (ref 1.2–4.9)
T3 Uptake Ratio: 32 % (ref 24–39)
T4, Total: 9.1 ug/dL (ref 4.5–12.0)
TSH: 17.2 u[IU]/mL — ABNORMAL HIGH (ref 0.450–4.500)

## 2021-08-04 DIAGNOSIS — I480 Paroxysmal atrial fibrillation: Secondary | ICD-10-CM | POA: Diagnosis not present

## 2021-08-04 DIAGNOSIS — E039 Hypothyroidism, unspecified: Secondary | ICD-10-CM | POA: Diagnosis not present

## 2021-08-04 DIAGNOSIS — I10 Essential (primary) hypertension: Secondary | ICD-10-CM | POA: Diagnosis not present

## 2021-08-04 DIAGNOSIS — D649 Anemia, unspecified: Secondary | ICD-10-CM | POA: Diagnosis not present

## 2021-08-04 DIAGNOSIS — J441 Chronic obstructive pulmonary disease with (acute) exacerbation: Secondary | ICD-10-CM | POA: Diagnosis not present

## 2021-08-04 DIAGNOSIS — I5042 Chronic combined systolic (congestive) and diastolic (congestive) heart failure: Secondary | ICD-10-CM | POA: Diagnosis not present

## 2021-08-04 DIAGNOSIS — K219 Gastro-esophageal reflux disease without esophagitis: Secondary | ICD-10-CM | POA: Diagnosis not present

## 2021-08-16 DIAGNOSIS — R42 Dizziness and giddiness: Secondary | ICD-10-CM | POA: Diagnosis not present

## 2021-08-16 DIAGNOSIS — N390 Urinary tract infection, site not specified: Secondary | ICD-10-CM | POA: Diagnosis not present

## 2021-08-17 ENCOUNTER — Ambulatory Visit: Payer: PPO | Admitting: Neurology

## 2021-08-17 ENCOUNTER — Encounter: Payer: Self-pay | Admitting: Neurology

## 2021-08-26 DIAGNOSIS — H1131 Conjunctival hemorrhage, right eye: Secondary | ICD-10-CM | POA: Diagnosis not present

## 2021-08-28 DIAGNOSIS — I480 Paroxysmal atrial fibrillation: Secondary | ICD-10-CM | POA: Diagnosis not present

## 2021-08-28 DIAGNOSIS — I5032 Chronic diastolic (congestive) heart failure: Secondary | ICD-10-CM | POA: Diagnosis not present

## 2021-08-28 DIAGNOSIS — I10 Essential (primary) hypertension: Secondary | ICD-10-CM | POA: Diagnosis not present

## 2021-09-04 ENCOUNTER — Other Ambulatory Visit: Payer: Self-pay | Admitting: Cardiology

## 2021-09-04 DIAGNOSIS — I480 Paroxysmal atrial fibrillation: Secondary | ICD-10-CM

## 2021-09-06 NOTE — Telephone Encounter (Signed)
Okay to refill? 

## 2021-09-16 DIAGNOSIS — N390 Urinary tract infection, site not specified: Secondary | ICD-10-CM | POA: Diagnosis not present

## 2021-09-16 DIAGNOSIS — R42 Dizziness and giddiness: Secondary | ICD-10-CM | POA: Diagnosis not present

## 2021-09-21 ENCOUNTER — Other Ambulatory Visit: Payer: Self-pay | Admitting: Student

## 2021-09-23 ENCOUNTER — Other Ambulatory Visit: Payer: Self-pay

## 2021-09-23 ENCOUNTER — Encounter: Payer: Self-pay | Admitting: Cardiology

## 2021-09-23 ENCOUNTER — Ambulatory Visit: Payer: PPO | Admitting: Cardiology

## 2021-09-23 VITALS — BP 168/84 | HR 70 | Temp 97.3°F | Resp 16 | Ht 64.0 in | Wt 118.2 lb

## 2021-09-23 DIAGNOSIS — I951 Orthostatic hypotension: Secondary | ICD-10-CM | POA: Diagnosis not present

## 2021-09-23 DIAGNOSIS — I480 Paroxysmal atrial fibrillation: Secondary | ICD-10-CM | POA: Diagnosis not present

## 2021-09-23 DIAGNOSIS — I5022 Chronic systolic (congestive) heart failure: Secondary | ICD-10-CM | POA: Diagnosis not present

## 2021-09-23 DIAGNOSIS — Z8719 Personal history of other diseases of the digestive system: Secondary | ICD-10-CM

## 2021-09-23 DIAGNOSIS — I10 Essential (primary) hypertension: Secondary | ICD-10-CM | POA: Diagnosis not present

## 2021-09-23 NOTE — Progress Notes (Signed)
Primary Physician/Referring:  [name], MD  Patient ID: Heidi Adams, female    DOB: 1936/06/18, 86 y.o.   MRN: [id number]  Chief Complaint  Patient presents with   Atrial Fibrillation   Orthostatic Hypotension   Follow-up    3 months    HPI:    Heidi Adams  is a 86 y.o. female  with bilateral hip replacement and knee replacement and left breast lumpectomy in 2018 without chemotherapy or radiation therapy for breast cancer and also remote hysterectomy due to uterine cancer in 1991, supine hypertension and orthostatic hypotension, paroxysmal atrial fibrillation on chronic Eliquis, chronic dizziness on standing orthostatic hypotension. Has not been able to tolerate any BP medications due to severe dizzy spells.  She also has history of A. fib with RVR SP direct-current cardioversion due to acute decompensated heart failure with severely systolic dysfunction on 01/25/1286 and also GI bleed at the time.  She is now tolerating Eliquis.    Patient is enrolled in remote patient weight monitoring.  Home weights reveal patient's weight is stable.   This is a 43-month office visit, she is presently doing well and tolerating all medications well.  She is wondering whether she can come off of some of the medications.   Social History   Tobacco Use   Smoking status: Former    Packs/day: 1.00    Years: 20.00    Pack years: 20.00    Types: Cigarettes    Quit date: 07/26/1987    Years since quitting: 34.1   Smokeless tobacco: Never  Substance Use Topics   Alcohol use: No    Alcohol/week: 0.0 standard drinks  Marital Status: Married   ROS   Review of Systems  Cardiovascular:  Positive for dyspnea on exertion (improved) and leg swelling (intermittent, stable and mild). Negative for chest pain, orthopnea, palpitations and syncope.   Objective  Blood pressure (!) 168/84, pulse 70, temperature (!) 97.3 F (36.3 C), temperature source Temporal, resp. rate 16, height 5\' 4"  (1.626 m), weight  118 lb 3.2 oz (53.6 kg), last menstrual period 07/25/1988, SpO2 95 %.  Vitals with BMI 09/23/2021 07/27/2021 07/15/2021  Height 5\' 4"  5\' 4"  -  Weight 118 lbs 3 oz 114 lbs 3 oz 114 lbs 10 oz  BMI 20.28 86.76 72.09  Systolic 470 962 836  Diastolic 84 74 84  Pulse 70 65 71    Orthostatic VS for the past 72 hrs (Last 3 readings):  Orthostatic BP Patient Position BP Location Cuff Size Orthostatic Pulse  09/23/21 1040 172/80 Standing Left Arm Small 72  09/23/21 1039 171/83 Sitting Left Arm Small 72  09/23/21 1038 (!) 179/92 Supine Left Arm Small 70    Physical Exam Vitals reviewed.  Constitutional:      Comments: Moderately build, frail  Neck:     Vascular: No carotid bruit or JVD.  Cardiovascular:     Rate and Rhythm: Normal rate and regular rhythm.     Pulses: Intact distal pulses.          Radial pulses are 2+ on the right side and 2+ on the left side.       Dorsalis pedis pulses are 1+ on the right side and 1+ on the left side.       Posterior tibial pulses are 1+ on the right side and 1+ on the left side.     Heart sounds: S1 normal and S2 normal. Murmur heard.  High-pitched blowing holosystolic murmur is present with  a grade of 2/6 at the apex.  High-pitched blowing decrescendo early diastolic murmur is present with a grade of 3/4 at the upper right sternal border radiating to the apex.    No gallop.  Pulmonary:     Effort: No accessory muscle usage or respiratory distress.     Breath sounds: Examination of the right-lower field reveals rhonchi. Examination of the left-lower field reveals rhonchi. Rhonchi (bibasilar coarse crackles, right worse, at the bases) present.  Abdominal:     General: Bowel sounds are normal.     Palpations: Abdomen is soft.  Musculoskeletal:     Right lower leg: No edema.     Left lower leg: No edema.   Laboratory examination:   Recent Labs    07/14/21 0541 07/15/21 0447 07/27/21 1016  NA 138 136 142  K 3.3* 3.4* 3.7  CL 101 100 109  CO2 28 27  26   GLUCOSE 121* 119* 87  BUN 24* 26* 22  CREATININE 0.90 0.99 0.94  CALCIUM 8.5* 8.5* 9.0  GFRNONAA >60 57* >60   CrCl cannot be calculated (Patient's most recent lab result is older than the maximum 21 days allowed.).  CMP Latest Ref Rng & Units 07/27/2021 07/15/2021 07/14/2021  Glucose 70 - 99 mg/dL 87 119(H) 121(H)  BUN 8 - 23 mg/dL 22 26(H) 24(H)  Creatinine 0.44 - 1.00 mg/dL 0.94 0.99 0.90  Sodium 135 - 145 mmol/L 142 136 138  Potassium 3.5 - 5.1 mmol/L 3.7 3.4(L) 3.3(L)  Chloride 98 - 111 mmol/L 109 100 101  CO2 22 - 32 mmol/L 26 27 28   Calcium 8.9 - 10.3 mg/dL 9.0 8.5(L) 8.5(L)  Total Protein 6.5 - 8.1 g/dL 6.0(L) - -  Total Bilirubin 0.3 - 1.2 mg/dL 0.8 - -  Alkaline Phos 38 - 126 U/L 57 - -  AST 15 - 41 U/L 15 - -  ALT 0 - 44 U/L 11 - -   CBC Latest Ref Rng & Units 07/27/2021 07/12/2021 04/26/2021  WBC 4.0 - 10.5 K/uL 6.9 7.2 9.7  Hemoglobin 12.0 - 15.0 g/dL 11.0(L) 11.1(L) 10.3(L)  Hematocrit 36.0 - 46.0 % 35.8(L) 36.3 32.0(L)  Platelets 150 - 400 K/uL 243 250 140(L)    TSH Recent Labs    01/26/21 1028 07/27/21 1016  TSH 17.100* 17.200*   BNP (last 3 results) Recent Labs    02/06/21 0828 07/12/21 0957  BNP 1,741.5* 2,073.8*   External labs:   Lipid Panel completed 04/09/2019 HDL 38 MG/DL 04/09/2019 LDL 80.000 mg 04/09/2019 Cholesterol, total 143.000 m 04/09/2019 Triglycerides 127.000 04/09/2019  A1C 5.000 07/31/2018  Glucose Random 110.000 m 11/04/2019 MicroAlbumin Urine 9.000 04/25/2019 MicroAlbumin/Creat 16.8 MG/ 04/25/2019 BUN 18.000 mg 11/04/2019 Creatinine, Serum 0.700 mg/ 11/04/2019  TSH 2.620 micr 11/04/2019  Allergies   Allergies  Allergen Reactions   Codeine Nausea Only   Epinephrine     Tachcardyia, chest pains tremors   Hydrocodone-Acetaminophen Other (See Comments)   Tenormin [Atenolol] Rash     Final Medications at End of Visit     Current Outpatient Medications:    acetaminophen (TYLENOL) 500 MG tablet, Take 1,000 mg by mouth 3  (three) times daily as needed for moderate pain. , Disp: , Rfl:    amiodarone (PACERONE) 200 MG tablet, Take 0.5 tablets (100 mg total) by mouth daily., Disp: 90 tablet, Rfl: 3   ELIQUIS 2.5 MG TABS tablet, TAKE 1 TABLET BY MOUTH 2 TIMES A DAY, Disp: 60 tablet, Rfl: 3   furosemide (LASIX) 40 MG  tablet, Take daily if weight up by 3 Lbs or shortness of breath or swelling. Take 40 mg once per day 2 times per week., Disp: 90 tablet, Rfl: 1   ipratropium-albuterol (DUONEB) 0.5-2.5 (3) MG/3ML SOLN, Take 3 mLs by nebulization every 6 (six) hours as needed (shortness of breath or wheezing)., Disp: 360 mL, Rfl: 0   levothyroxine (SYNTHROID, LEVOTHROID) 112 MCG tablet, Take 112-168 mcg by mouth daily before breakfast. Take 1 tablet (112 mcg) on Mondays, Tuesdays,, Thursdays, Fridays & Saturdays. Take 1.5 tablet (168 mcg) on Sundays & Wednesday, Disp: , Rfl:    losartan (COZAAR) 50 MG tablet, TAKE 1 TABLET BY MOUTH EACH NIGHT AT BEDTIME (Patient taking differently: Take 25 mg by mouth at bedtime.), Disp: 90 tablet, Rfl: 1   metoprolol succinate (TOPROL-XL) 25 MG 24 hr tablet, Take 25 mg by mouth daily. Take with or immediately following a meal., Disp: , Rfl:    Multiple Vitamins-Minerals (CENTRUM MINIS WOMEN 50+) TABS, Take 1 tablet by mouth daily., Disp: , Rfl:    pantoprazole (PROTONIX) 40 MG tablet, Take 40 mg by mouth daily as needed (indigestion/heartburn.). , Disp: , Rfl:    potassium chloride (KLOR-CON) 10 MEQ tablet, Take 2 tablets (20 mEq total) by mouth every other day. Take every time with furosemide (Patient taking differently: Take 20 mEq by mouth every 3 (three) days. Taking every three days with furosemide), Disp: 180 tablet, Rfl: 1    Radiology:   No results found.  Cardiac Studies:    Lexiscan Myoview stress test  04/23/2018: 1. The resting electrocardiogram demonstrated normal sinus rhythm, normal resting conduction and no resting arrhythmias.  Stress EKG is non-diagnostic for ischemia as  it a pharmacologic stress using Lexiscan. Occasional PAC and PVC. The patient developed significant symptoms which included Chest Pain, Stomach pain, Headache.  2. The overall quality of the study is good. There is no evidence of abnormal lung activity. Stress and rest SPECT images demonstrate homogeneous tracer distribution throughout the myocardium. Gated SPECT imaging reveals diffuse global hypokinesis. The left ventricular ejection fraction was markedly depressed at (26%).   3. This is a high risk study due to severe LV systolic dysfunction. Findings may represent non ischemic cardiomyopathy.  PCV ECHOCARDIOGRAM COMPLETE 03/04/2021 Left ventricle cavity is normal in size. Moderate concentric hypertrophy of the left ventricle. Severe global hypokinesis. LV systolic function with visual EF 25-30%. Diastolic function not assessed due to severity of valvular regurgitation. Left atrial cavity is severely dilated. Structurally normal trileaflet aortic valve. No evidence of aortic stenosis. Moderate (Grade III) aortic regurgitation. Moderate (Grade III) mitral regurgitation. Moderate tricuspid regurgitation. Moderate pulmonic regurgitation. No evidence of pulmonary hypertension. No significant change compared to previous study on 07/23/2020.   EKG   EKG 09/23/2021: Normal sinus rhythm at rate of 70 bpm, left axis deviation, left anterior fascicular block.  Incomplete right bundle branch block.  Nonspecific T abnormality.  No significant change from 06/23/2021.   Assessment     ICD-10-CM   1. AF (paroxysmal atrial fibrillation) (HCC)  I48.0 EKG 12-Lead    2. Chronic systolic heart failure (HCC)  I50.22 PCV ECHOCARDIOGRAM COMPLETE    3. Orthostatic hypotension  I95.1     4. Essential hypertension  I10     5. History of esophageal stricture  Z87.19 Ambulatory referral to Gastroenterology      CHA2DS2-VASc Score is 5.  Yearly risk of stroke: 7.2% (A, F, HTN, CHF).  Score of 1=0.6; 2=2.2;  3=3.2; 4=4.8; 5=7.2; 6=9.8; 7=>9.8) -(CHF;  HTN; vasc disease DM,  Female = 1; Age <65 =0; 65-74 = 1,  >75 =2; stroke/embolism= 2).   No orders of the defined types were placed in this encounter.    Medications Discontinued During This Encounter  Medication Reason   carboxymethylcellulose (REFRESH PLUS) 0.5 % SOLN    mometasone-formoterol (DULERA) 200-5 MCG/ACT AERO     Orders Placed This Encounter  Procedures   Ambulatory referral to Gastroenterology    Referral Priority:   Routine    Referral Type:   Consultation    Referral Reason:   Specialty Services Required    Requested Specialty:   Gastroenterology    Number of Visits Requested:   1   EKG 12-Lead   PCV ECHOCARDIOGRAM COMPLETE    Standing Status:   Future    Standing Expiration Date:   09/24/2022   Recommendations:   Heidi Adams  is a 86 y.o. female  with bilateral hip replacement and knee replacement and left breast lumpectomy in 2018 without chemotherapy or radiation therapy for breast cancer and also remote hysterectomy due to uterine cancer in 1991, supine hypertension and orthostatic hypotension, paroxysmal atrial fibrillation on chronic Eliquis, chronic dizziness and orthostatic hypotension. Has not been able to tolerate any BP medications due to severe dizzy spells.  She also has history of A. fib with RVR SP direct-current cardioversion due to acute decompensated heart failure with severely systolic dysfunction on 04/01/3531 and also GI bleed at the time.  She is now tolerating Eliquis.   Patient is here for a 31-month office visit, for the first time she has done remarkably well with regard to her dyspnea, dizziness, palpitations and fatigue.  Although her blood pressure was elevated today, I did not make any changes as she is very reluctant and extremely careful with medications.  Also she has compensated very well.  She is tolerating anticoagulation without bleeding diathesis.  No clinical evidence of heart failure.  I would  like to repeat echocardiogram as she is feeling the best she has in quite a while, no recent hospitalization, no recent emergency room visits or even office calls in the last 3 months.  I am very pleased with her progress.  We discussed regarding elevated blood pressure and orthostatic hypotension with her and her husband at the bedside, patient would prefer to continue present medications and not make any changes.   Adrian Prows, PA-C 09/23/2021, 8:02 PM Office: [phone]

## 2021-09-25 DIAGNOSIS — I5032 Chronic diastolic (congestive) heart failure: Secondary | ICD-10-CM | POA: Diagnosis not present

## 2021-09-25 DIAGNOSIS — I480 Paroxysmal atrial fibrillation: Secondary | ICD-10-CM | POA: Diagnosis not present

## 2021-09-25 DIAGNOSIS — I10 Essential (primary) hypertension: Secondary | ICD-10-CM | POA: Diagnosis not present

## 2021-10-05 ENCOUNTER — Telehealth: Payer: Self-pay | Admitting: Cardiology

## 2021-10-05 ENCOUNTER — Other Ambulatory Visit: Payer: PPO

## 2021-10-05 NOTE — Telephone Encounter (Signed)
Pt lost part of a tooth today and is needing to know if she will be able to have a dental procedure with some medications that she is currently on. ?

## 2021-10-06 NOTE — Telephone Encounter (Signed)
Yes she can.

## 2021-10-06 NOTE — Telephone Encounter (Signed)
Pt lost part of a tooth today and is needing to know if she will be able to have a dental procedure with some medications that she is currently on.

## 2021-10-06 NOTE — Telephone Encounter (Signed)
Called pt to inform her about the message above

## 2021-10-08 DIAGNOSIS — I509 Heart failure, unspecified: Secondary | ICD-10-CM | POA: Diagnosis not present

## 2021-10-08 DIAGNOSIS — Z681 Body mass index (BMI) 19 or less, adult: Secondary | ICD-10-CM | POA: Diagnosis not present

## 2021-10-08 DIAGNOSIS — D692 Other nonthrombocytopenic purpura: Secondary | ICD-10-CM | POA: Diagnosis not present

## 2021-10-14 DIAGNOSIS — R42 Dizziness and giddiness: Secondary | ICD-10-CM | POA: Diagnosis not present

## 2021-10-14 DIAGNOSIS — N390 Urinary tract infection, site not specified: Secondary | ICD-10-CM | POA: Diagnosis not present

## 2021-10-15 DIAGNOSIS — R600 Localized edema: Secondary | ICD-10-CM | POA: Diagnosis not present

## 2021-10-15 DIAGNOSIS — Z8709 Personal history of other diseases of the respiratory system: Secondary | ICD-10-CM | POA: Diagnosis not present

## 2021-10-22 DIAGNOSIS — E785 Hyperlipidemia, unspecified: Secondary | ICD-10-CM | POA: Diagnosis not present

## 2021-10-22 DIAGNOSIS — M1990 Unspecified osteoarthritis, unspecified site: Secondary | ICD-10-CM | POA: Diagnosis not present

## 2021-10-22 DIAGNOSIS — I480 Paroxysmal atrial fibrillation: Secondary | ICD-10-CM | POA: Diagnosis not present

## 2021-10-22 DIAGNOSIS — I10 Essential (primary) hypertension: Secondary | ICD-10-CM | POA: Diagnosis not present

## 2021-10-26 DIAGNOSIS — Z515 Encounter for palliative care: Secondary | ICD-10-CM | POA: Diagnosis not present

## 2021-10-26 DIAGNOSIS — Z7901 Long term (current) use of anticoagulants: Secondary | ICD-10-CM | POA: Diagnosis not present

## 2021-10-26 DIAGNOSIS — I5032 Chronic diastolic (congestive) heart failure: Secondary | ICD-10-CM | POA: Diagnosis not present

## 2021-10-26 DIAGNOSIS — I10 Essential (primary) hypertension: Secondary | ICD-10-CM | POA: Diagnosis not present

## 2021-10-26 DIAGNOSIS — D6869 Other thrombophilia: Secondary | ICD-10-CM | POA: Diagnosis not present

## 2021-10-26 DIAGNOSIS — Z66 Do not resuscitate: Secondary | ICD-10-CM | POA: Diagnosis not present

## 2021-10-26 DIAGNOSIS — Z9012 Acquired absence of left breast and nipple: Secondary | ICD-10-CM | POA: Diagnosis not present

## 2021-10-26 DIAGNOSIS — Z853 Personal history of malignant neoplasm of breast: Secondary | ICD-10-CM | POA: Diagnosis not present

## 2021-10-26 DIAGNOSIS — I480 Paroxysmal atrial fibrillation: Secondary | ICD-10-CM | POA: Diagnosis not present

## 2021-10-27 DIAGNOSIS — R5383 Other fatigue: Secondary | ICD-10-CM | POA: Diagnosis not present

## 2021-10-27 DIAGNOSIS — E039 Hypothyroidism, unspecified: Secondary | ICD-10-CM | POA: Diagnosis not present

## 2021-10-27 DIAGNOSIS — I10 Essential (primary) hypertension: Secondary | ICD-10-CM | POA: Diagnosis not present

## 2021-10-29 DIAGNOSIS — S59902A Unspecified injury of left elbow, initial encounter: Secondary | ICD-10-CM | POA: Diagnosis not present

## 2021-10-30 DIAGNOSIS — S5002XA Contusion of left elbow, initial encounter: Secondary | ICD-10-CM | POA: Diagnosis not present

## 2021-10-30 DIAGNOSIS — S53125A Posterior dislocation of left ulnohumeral joint, initial encounter: Secondary | ICD-10-CM | POA: Diagnosis not present

## 2021-11-03 DIAGNOSIS — M25522 Pain in left elbow: Secondary | ICD-10-CM | POA: Diagnosis not present

## 2021-11-07 ENCOUNTER — Encounter: Payer: Self-pay | Admitting: Cardiology

## 2021-11-08 ENCOUNTER — Emergency Department (HOSPITAL_COMMUNITY): Payer: PPO

## 2021-11-08 ENCOUNTER — Emergency Department (HOSPITAL_COMMUNITY)
Admission: EM | Admit: 2021-11-08 | Discharge: 2021-11-08 | Discharge status: Against Medical Advice | Payer: PPO | Attending: Emergency Medicine | Admitting: Emergency Medicine

## 2021-11-08 ENCOUNTER — Encounter (HOSPITAL_COMMUNITY): Payer: Self-pay

## 2021-11-08 ENCOUNTER — Other Ambulatory Visit: Payer: PPO

## 2021-11-08 DIAGNOSIS — I480 Paroxysmal atrial fibrillation: Secondary | ICD-10-CM | POA: Diagnosis not present

## 2021-11-08 DIAGNOSIS — M7989 Other specified soft tissue disorders: Secondary | ICD-10-CM | POA: Diagnosis not present

## 2021-11-08 DIAGNOSIS — I509 Heart failure, unspecified: Secondary | ICD-10-CM | POA: Diagnosis not present

## 2021-11-08 DIAGNOSIS — I517 Cardiomegaly: Secondary | ICD-10-CM | POA: Diagnosis not present

## 2021-11-08 DIAGNOSIS — Z5321 Procedure and treatment not carried out due to patient leaving prior to being seen by health care provider: Secondary | ICD-10-CM | POA: Insufficient documentation

## 2021-11-08 DIAGNOSIS — R0602 Shortness of breath: Secondary | ICD-10-CM | POA: Diagnosis not present

## 2021-11-08 DIAGNOSIS — R0789 Other chest pain: Secondary | ICD-10-CM | POA: Insufficient documentation

## 2021-11-08 LAB — CBC
HCT: 37.5 % (ref 36.0–46.0)
Hemoglobin: 11 g/dL — ABNORMAL LOW (ref 12.0–15.0)
MCH: 25.4 pg — ABNORMAL LOW (ref 26.0–34.0)
MCHC: 29.3 g/dL — ABNORMAL LOW (ref 30.0–36.0)
MCV: 86.6 fL (ref 80.0–100.0)
Platelets: 316 10*3/uL (ref 150–400)
RBC: 4.33 MIL/uL (ref 3.87–5.11)
RDW: 17.2 % — ABNORMAL HIGH (ref 11.5–15.5)
WBC: 14 10*3/uL — ABNORMAL HIGH (ref 4.0–10.5)
nRBC: 0 % (ref 0.0–0.2)

## 2021-11-08 LAB — BRAIN NATRIURETIC PEPTIDE: B Natriuretic Peptide: 2098.6 pg/mL — ABNORMAL HIGH (ref 0.0–100.0)

## 2021-11-08 LAB — BASIC METABOLIC PANEL
Anion gap: 7 (ref 5–15)
BUN: 25 mg/dL — ABNORMAL HIGH (ref 8–23)
CO2: 24 mmol/L (ref 22–32)
Calcium: 8.5 mg/dL — ABNORMAL LOW (ref 8.9–10.3)
Chloride: 109 mmol/L (ref 98–111)
Creatinine, Ser: 0.7 mg/dL (ref 0.44–1.00)
GFR, Estimated: >60 mL/min (ref >60)
Glucose, Bld: 94 mg/dL (ref 70–99)
Potassium: 3.9 mmol/L (ref 3.5–5.1)
Sodium: 140 mmol/L (ref 135–145)

## 2021-11-08 LAB — TROPONIN I (HIGH SENSITIVITY)
Troponin I (High Sensitivity): 11 ng/L (ref <18)
Troponin I (High Sensitivity): 12 ng/L (ref <18)

## 2021-11-08 IMAGING — DX DG CHEST 1V PORT
1 series · 1 of 1 positions shown · non-contrast
Comparison: 07/14/2021

CLINICAL DATA: Shortness of breath

EXAM:
PORTABLE CHEST 1 VIEW

[chest ap]
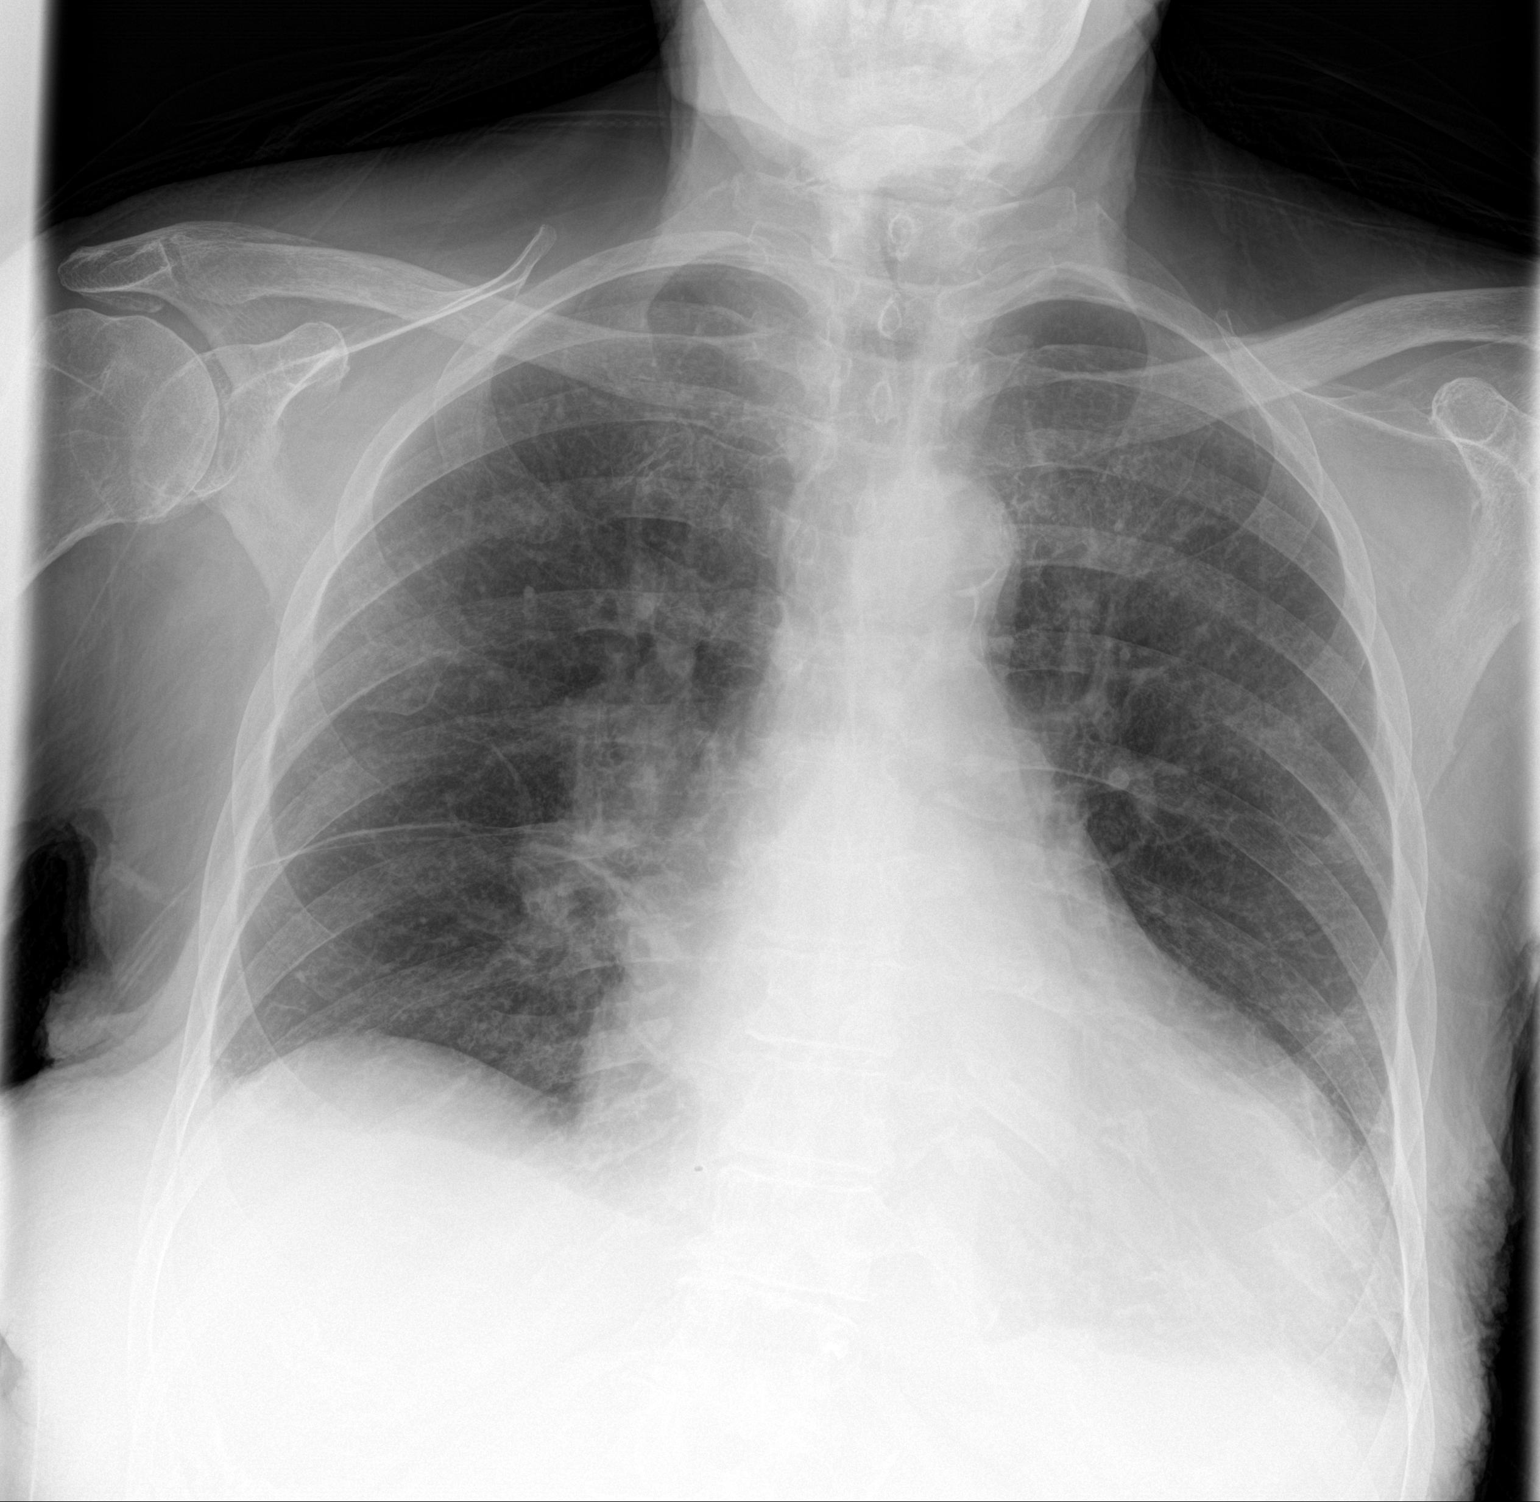

[1 of 1 positions shown; findings below may reference images not displayed]

FINDINGS: No new consolidation or edema. No pleural effusion or pneumothorax.
Stable cardiomediastinal contours with mild cardiomegaly.
IMPRESSION: No acute process in the chest.  Stable mild cardiomegaly.

## 2021-11-08 NOTE — ED Triage Notes (Addendum)
Pt arrived via POV, c/o left leg "leaking". Compression stockings on left side, saturated.  Leg swelling bilateral.  ?

## 2021-11-08 NOTE — ED Notes (Signed)
Pt seen leaving facility ?

## 2021-11-08 NOTE — ED Provider Triage Note (Signed)
Emergency Medicine Provider Triage Evaluation Note ? ?Heidi Adams , a 86 y.o. female  was evaluated in triage.  Pt complains of fluid leaking from her leg starting this morning.  Significant hx of CHF and paroxysmal Afib.  Takes '40mg'$  Lasix as needed.  Accompanied with shortness of breath and mild intermittent chest pain without radiation localized to small area on the left side.  Denies fever, recent upper respiratory infection, cough, vision changes, numbness/tingling of leg, or palpitations. ? ?Review of Systems  ?Positive: As above ?Negative: As above ? ?Physical Exam  ?BP (!) 165/89 (BP Location: Left Arm)   Pulse 78   Temp 97.7 ?F (36.5 ?C) (Oral)   Resp 16   Ht '5\' 4"'$  (1.626 m)   Wt 56.7 kg   LMP 07/25/1988 Comment: full   SpO2 98%   BMI 21.46 kg/m?  ?Gen:   Awake, no distress   ?Resp:  Normal effort, Rales of bases bilaterally ?MSK:   Moves extremities without difficulty ?Other:  Chest pain not reproducible, O2 98%, BIL leg edema with 2+ DP and PT pulses bilaterally; Clear fluid and blood leaking from left leg that is cool to touch; full ROM and sensation of LLE ? ?Medical Decision Making  ?Medically screening exam initiated at 12:51 PM.  Appropriate orders placed.  Heidi Adams was informed that the remainder of the evaluation will be completed by another provider, this initial triage assessment does not replace that evaluation, and the importance of remaining in the ED until their evaluation is complete. ? ?Labs, imaging, EKG ordered ?  ?Heidi Rome, PA-C ?01/22/15 1310 ? ?

## 2021-11-09 ENCOUNTER — Other Ambulatory Visit: Payer: PPO

## 2021-11-09 DIAGNOSIS — I509 Heart failure, unspecified: Secondary | ICD-10-CM | POA: Diagnosis not present

## 2021-11-12 NOTE — Progress Notes (Signed)
? ?Primary Physician/Referring:  [name], MD ? ?Patient ID: Heidi Adams, female    DOB: 1936/03/26, 86 y.o.   MRN: [id number] ? ?Chief Complaint  ?Patient presents with  ? Atrial Fibrillation  ? Leg Swelling  ? Follow-up  ? ? ?HPI:   ? ?Heidi Adams  is a 86 y.o. female  with bilateral hip replacement and knee replacement and left breast lumpectomy in 2018 without chemotherapy or radiation therapy for breast cancer and also remote hysterectomy due to uterine cancer in 1991, supine hypertension and orthostatic hypotension, paroxysmal atrial fibrillation on chronic Eliquis, chronic dizziness on standing orthostatic hypotension. Has not been able to tolerate any BP medications due to severe dizzy spells.  She also has history of A. fib with RVR SP direct-current cardioversion due to acute decompensated heart failure with severely systolic dysfunction on 12/26/4032 and also GI bleed at the time.  She is now tolerating Eliquis.   ? ?Patient presents for urgent visit at her request today.  Patient states about 1 week ago she developed worsening bilateral lower leg edema, however this is now significantly improved and is back to baseline.  Patient's weight remains relatively stable.  She denies worsening dyspnea, chest pain, dizziness, orthopnea, PND. ? ?Previously ordered echocardiogram has not been done yet. ? ?Social History  ? ?Tobacco Use  ? Smoking status: Former  ?  Packs/day: 1.00  ?  Years: 20.00  ?  Pack years: 20.00  ?  Types: Cigarettes  ?  Quit date: 07/26/1987  ?  Years since quitting: 34.3  ? Smokeless tobacco: Never  ?Substance Use Topics  ? Alcohol use: No  ?  Alcohol/week: 0.0 standard drinks  ?Marital Status: Married  ? ?ROS  ? ?Review of Systems  ?Cardiovascular:  Positive for dyspnea on exertion (improved) and leg swelling (intermittent, stable and mild). Negative for chest pain, orthopnea, palpitations and syncope.  ? ?Objective  ?Blood pressure (!) 158/74, pulse 75, temperature 97.7 ?F (36.5 ?C),  resp. rate 16, height '5\' 4"'$  (1.626 m), weight 121 lb (54.9 kg), last menstrual period 07/25/1988, SpO2 95 %.  ? ?  11/15/2021  ? 10:07 AM 11/08/2021  ?  2:52 PM 11/08/2021  ? 12:21 PM  ?Vitals with BMI  ?Height '5\' 4"'$   '5\' 4"'$   ?Weight 121 lbs  125 lbs  ?BMI 20.76  21.45  ?Systolic 742 595 638  ?Diastolic 74 85 89  ?Pulse 75 78 78  ?  ?Orthostatic VS for the past 72 hrs (Last 3 readings): ? Patient Position BP Location Cuff Size  ?11/15/21 1007 Sitting Left Arm Normal  ? ? Physical Exam ?Vitals reviewed.  ?Constitutional:   ?   Comments: Moderately build, frail  ?Neck:  ?   Vascular: No carotid bruit or JVD.  ?Cardiovascular:  ?   Rate and Rhythm: Normal rate and regular rhythm.  ?   Pulses: Intact distal pulses.     ?     Radial pulses are 2+ on the right side and 2+ on the left side.  ?     Dorsalis pedis pulses are 1+ on the right side and 1+ on the left side.  ?     Posterior tibial pulses are 1+ on the right side and 1+ on the left side.  ?   Heart sounds: S1 normal and S2 normal. Murmur heard.  ?High-pitched blowing holosystolic murmur is present with a grade of 2/6 at the apex.  ?High-pitched blowing decrescendo early diastolic murmur is present  with a grade of 3/4 at the upper right sternal border radiating to the apex.  ?  No gallop.  ?Pulmonary:  ?   Effort: No accessory muscle usage or respiratory distress.  ?   Breath sounds: Examination of the right-lower field reveals rhonchi. Examination of the left-lower field reveals rhonchi. Rhonchi (bibasilar coarse crackles, right worse, at the bases) present.  ?Musculoskeletal:  ?   Right lower leg: Edema (minimal) present.  ?   Left lower leg: Edema (minimal) present.  ? ?Laboratory examination:  ? ?Recent Labs  ?  07/15/21 ?9485 07/27/21 ?1016 11/08/21 ?1325  ?NA 136 142 140  ?K 3.4* 3.7 3.9  ?CL 100 109 109  ?CO2 '27 26 24  '$ ?GLUCOSE 119* 87 94  ?BUN 26* 22 25*  ?CREATININE 0.99 0.94 0.70  ?CALCIUM 8.5* 9.0 8.5*  ?GFRNONAA 57* >60 >60  ? ?estimated creatinine  clearance is 46 mL/min (by C-G formula based on SCr of 0.7 mg/dL).  ? ?  Latest Ref Rng & Units 11/08/2021  ?  1:25 PM 07/27/2021  ? 10:16 AM 07/15/2021  ?  4:47 AM  ?CMP  ?Glucose 70 - 99 mg/dL 94   87   119    ?BUN 8 - 23 mg/dL '25   22   26    '$ ?Creatinine 0.44 - 1.00 mg/dL 0.70   0.94   0.99    ?Sodium 135 - 145 mmol/L 140   142   136    ?Potassium 3.5 - 5.1 mmol/L 3.9   3.7   3.4    ?Chloride 98 - 111 mmol/L 109   109   100    ?CO2 22 - 32 mmol/L '24   26   27    '$ ?Calcium 8.9 - 10.3 mg/dL 8.5   9.0   8.5    ?Total Protein 6.5 - 8.1 g/dL  6.0     ?Total Bilirubin 0.3 - 1.2 mg/dL  0.8     ?Alkaline Phos 38 - 126 U/L  57     ?AST 15 - 41 U/L  15     ?ALT 0 - 44 U/L  11     ? ? ?  Latest Ref Rng & Units 11/08/2021  ?  1:25 PM 07/27/2021  ? 10:16 AM 07/12/2021  ?  9:57 AM  ?CBC  ?WBC 4.0 - 10.5 K/uL 14.0   6.9   7.2    ?Hemoglobin 12.0 - 15.0 g/dL 11.0   11.0   11.1    ?Hematocrit 36.0 - 46.0 % 37.5   35.8   36.3    ?Platelets 150 - 400 K/uL 316   243   250    ? ? ?TSH ?Recent Labs  ?  01/26/21 ?1028 07/27/21 ?1016  ?TSH 17.100* 17.200*  ? ?BNP (last 3 results) ?Recent Labs  ?  02/06/21 ?0828 07/12/21 ?0957 11/08/21 ?1325  ?BNP 1,741.5* 2,073.8* 2,098.6*  ? ?External labs:  ? ?Lipid Panel completed 04/09/2019 ?HDL 38 MG/DL 04/09/2019 ?LDL 80.000 mg 04/09/2019 ?Cholesterol, total 143.000 m 04/09/2019 ?Triglycerides 127.000 04/09/2019 ? ?A1C 5.000 07/31/2018 ? ?Glucose Random 110.000 m 11/04/2019 ?MicroAlbumin Urine 9.000 04/25/2019 ?MicroAlbumin/Creat 16.8 MG/ 04/25/2019 ?BUN 18.000 mg 11/04/2019 ?Creatinine, Serum 0.700 mg/ 11/04/2019 ? ?TSH 2.620 micr 11/04/2019 ? ?Allergies  ? ?Allergies  ?Allergen Reactions  ? Codeine Nausea Only  ? Epinephrine   ?  Tachcardyia, chest pains tremors  ? Hydrocodone-Acetaminophen Other (See Comments)  ? Tenormin [Atenolol] Rash  ?  ? ?Final Medications at  End of Visit   ? ? ?Current Outpatient Medications:  ?  acetaminophen (TYLENOL) 500 MG tablet, Take 1,000 mg by mouth 3 (three) times daily as needed  for moderate pain. , Disp: , Rfl:  ?  amiodarone (PACERONE) 200 MG tablet, Take 0.5 tablets (100 mg total) by mouth daily., Disp: 90 tablet, Rfl: 3 ?  ELIQUIS 2.5 MG TABS tablet, TAKE 1 TABLET BY MOUTH 2 TIMES A DAY, Disp: 60 tablet, Rfl: 3 ?  furosemide (LASIX) 40 MG tablet, Take daily if weight up by 3 Lbs or shortness of breath or swelling. Take 40 mg once per day 2 times per week., Disp: 90 tablet, Rfl: 1 ?  ipratropium-albuterol (DUONEB) 0.5-2.5 (3) MG/3ML SOLN, Take 3 mLs by nebulization every 6 (six) hours as needed (shortness of breath or wheezing)., Disp: 360 mL, Rfl: 0 ?  levothyroxine (SYNTHROID, LEVOTHROID) 112 MCG tablet, Take 112-168 mcg by mouth daily before breakfast. Take 1 tablet (112 mcg) on Mondays, Tuesdays,, Thursdays, Fridays & Saturdays. Take 1.5 tablet (168 mcg) on Sundays & Wednesday, Disp: , Rfl:  ?  losartan (COZAAR) 50 MG tablet, TAKE 1 TABLET BY MOUTH EACH NIGHT AT BEDTIME (Patient taking differently: Take 25 mg by mouth at bedtime.), Disp: 90 tablet, Rfl: 1 ?  metoprolol succinate (TOPROL-XL) 25 MG 24 hr tablet, Take 25 mg by mouth daily. Take with or immediately following a meal., Disp: , Rfl:  ?  Multiple Vitamins-Minerals (CENTRUM MINIS WOMEN 50+) TABS, Take 1 tablet by mouth daily., Disp: , Rfl:  ?  pantoprazole (PROTONIX) 40 MG tablet, Take 40 mg by mouth daily as needed (indigestion/heartburn.). , Disp: , Rfl:  ?  potassium chloride (KLOR-CON) 10 MEQ tablet, Take 2 tablets (20 mEq total) by mouth every other day. Take every time with furosemide (Patient taking differently: Take 20 mEq by mouth every 3 (three) days. Taking every three days with furosemide), Disp: 180 tablet, Rfl: 1  ?  ?Radiology:  ? ?No results found. ? ?Cardiac Studies:  ?  ?Lexiscan Myoview stress test  04/23/2018: ?1. The resting electrocardiogram demonstrated normal sinus rhythm, normal resting conduction and no resting arrhythmias.  Stress EKG is non-diagnostic for ischemia as it a pharmacologic stress using  Lexiscan. Occasional PAC and PVC. The patient developed significant symptoms which included Chest Pain, Stomach pain, Headache.  ?2. The overall quality of the study is good. There is no evidence of abnormal

## 2021-11-14 DIAGNOSIS — N390 Urinary tract infection, site not specified: Secondary | ICD-10-CM | POA: Diagnosis not present

## 2021-11-14 DIAGNOSIS — R42 Dizziness and giddiness: Secondary | ICD-10-CM | POA: Diagnosis not present

## 2021-11-15 ENCOUNTER — Encounter: Payer: Self-pay | Admitting: Student

## 2021-11-15 ENCOUNTER — Ambulatory Visit: Payer: PPO | Admitting: Student

## 2021-11-15 VITALS — BP 158/74 | HR 75 | Temp 97.7°F | Resp 16 | Ht 64.0 in | Wt 121.0 lb

## 2021-11-15 DIAGNOSIS — I5022 Chronic systolic (congestive) heart failure: Secondary | ICD-10-CM | POA: Diagnosis not present

## 2021-11-15 DIAGNOSIS — R600 Localized edema: Secondary | ICD-10-CM | POA: Diagnosis not present

## 2021-11-21 DIAGNOSIS — I110 Hypertensive heart disease with heart failure: Secondary | ICD-10-CM | POA: Diagnosis not present

## 2021-11-21 DIAGNOSIS — I480 Paroxysmal atrial fibrillation: Secondary | ICD-10-CM | POA: Diagnosis not present

## 2021-11-21 DIAGNOSIS — E785 Hyperlipidemia, unspecified: Secondary | ICD-10-CM | POA: Diagnosis not present

## 2021-11-21 DIAGNOSIS — E039 Hypothyroidism, unspecified: Secondary | ICD-10-CM | POA: Diagnosis not present

## 2021-11-25 DIAGNOSIS — I10 Essential (primary) hypertension: Secondary | ICD-10-CM | POA: Diagnosis not present

## 2021-11-25 DIAGNOSIS — I480 Paroxysmal atrial fibrillation: Secondary | ICD-10-CM | POA: Diagnosis not present

## 2021-11-25 DIAGNOSIS — I5032 Chronic diastolic (congestive) heart failure: Secondary | ICD-10-CM | POA: Diagnosis not present

## 2021-12-02 ENCOUNTER — Telehealth: Payer: Self-pay | Admitting: Pharmacist

## 2021-12-02 NOTE — Telephone Encounter (Signed)
Pt called stating that her weight this morning of 116 lbs this morning. Pt reports that she has been taking lasix 40 mg daily since last OV and wants to return back to taking it QOD. When asked for reason why she wanted to decrease her lasix dose, she stated that this was the lowest weight she has been and was concerned about persistent weight loss.  Pt was previously not compliant with scheduled lasix dosing and ended up having worsening symptoms of SOB, and lower extremity swelling. Pt agreeable to start taking lasix 40 mg QOD moving forward. Reviewed goalpost of if weight >120 lbs, to return to taking it daily. Pt agreed with the plan.  ?

## 2021-12-09 DIAGNOSIS — R2681 Unsteadiness on feet: Secondary | ICD-10-CM | POA: Diagnosis not present

## 2021-12-09 DIAGNOSIS — Z7901 Long term (current) use of anticoagulants: Secondary | ICD-10-CM | POA: Diagnosis not present

## 2021-12-09 DIAGNOSIS — E039 Hypothyroidism, unspecified: Secondary | ICD-10-CM | POA: Diagnosis not present

## 2021-12-09 DIAGNOSIS — R0600 Dyspnea, unspecified: Secondary | ICD-10-CM | POA: Diagnosis not present

## 2021-12-09 DIAGNOSIS — R634 Abnormal weight loss: Secondary | ICD-10-CM | POA: Diagnosis not present

## 2021-12-09 DIAGNOSIS — I5042 Chronic combined systolic (congestive) and diastolic (congestive) heart failure: Secondary | ICD-10-CM | POA: Diagnosis not present

## 2021-12-09 DIAGNOSIS — D6869 Other thrombophilia: Secondary | ICD-10-CM | POA: Diagnosis not present

## 2021-12-09 DIAGNOSIS — M0600 Rheumatoid arthritis without rheumatoid factor, unspecified site: Secondary | ICD-10-CM | POA: Diagnosis not present

## 2021-12-09 DIAGNOSIS — I700 Atherosclerosis of aorta: Secondary | ICD-10-CM | POA: Diagnosis not present

## 2021-12-09 DIAGNOSIS — I480 Paroxysmal atrial fibrillation: Secondary | ICD-10-CM | POA: Diagnosis not present

## 2021-12-09 DIAGNOSIS — E785 Hyperlipidemia, unspecified: Secondary | ICD-10-CM | POA: Diagnosis not present

## 2021-12-09 DIAGNOSIS — R42 Dizziness and giddiness: Secondary | ICD-10-CM | POA: Diagnosis not present

## 2021-12-09 DIAGNOSIS — I110 Hypertensive heart disease with heart failure: Secondary | ICD-10-CM | POA: Diagnosis not present

## 2021-12-10 ENCOUNTER — Telehealth: Payer: Self-pay

## 2021-12-14 ENCOUNTER — Encounter: Payer: Self-pay | Admitting: Student

## 2021-12-14 ENCOUNTER — Ambulatory Visit: Payer: PPO | Admitting: Student

## 2021-12-14 VITALS — BP 153/76 | HR 68 | Temp 98.4°F | Resp 17 | Ht 64.0 in | Wt 106.0 lb

## 2021-12-14 DIAGNOSIS — I5022 Chronic systolic (congestive) heart failure: Secondary | ICD-10-CM | POA: Diagnosis not present

## 2021-12-14 DIAGNOSIS — N390 Urinary tract infection, site not specified: Secondary | ICD-10-CM | POA: Diagnosis not present

## 2021-12-14 DIAGNOSIS — I480 Paroxysmal atrial fibrillation: Secondary | ICD-10-CM

## 2021-12-14 DIAGNOSIS — R42 Dizziness and giddiness: Secondary | ICD-10-CM | POA: Diagnosis not present

## 2021-12-14 MED ORDER — FUROSEMIDE 20 MG PO TABS: 20.0000 mg | ORAL_TABLET | Freq: Every day | ORAL | 3 refills | Status: DC

## 2021-12-14 NOTE — Progress Notes (Signed)
Primary Physician/Referring:  [name], MD  Patient ID: Heidi Adams, female    DOB: 1936-01-27, 86 y.o.   MRN: [id number]  Chief Complaint  Patient presents with   Weight Loss    HPI:    Heidi Adams  is a 86 y.o. female  with bilateral hip replacement and knee replacement and left breast lumpectomy in 2018 without chemotherapy or radiation therapy for breast cancer and also remote hysterectomy due to uterine cancer in 1991, supine hypertension and orthostatic hypotension, paroxysmal atrial fibrillation on chronic Eliquis, chronic dizziness on standing orthostatic hypotension. Has not been able to tolerate any BP medications due to severe dizzy spells.  She also has history of A. fib with RVR SP direct-current cardioversion due to acute decompensated heart failure with severely systolic dysfunction on 02/25/1516 and also GI bleed at the time.  She is now tolerating Eliquis.    Patient presents for urgent visit at her request today with concerns of "rapid weight loss". Notably patient was previously not compliant with scheduled lasix dosing and ended up having worsening symptoms of SOB, and lower extremity swelling. Patient therefore agreeable at last discussion with our office to continue Lasix 40 mg every other day.  Patient reports she was seen by PCP few weeks ago at which time BNP was elevated and she was advised to increase Lasix to 40 mg p.o. twice daily temporarily, then return to taking Lasix 40 mg once daily. Her primary concern is that she has lost approximately 9 pounds in the last 2 weeks.  Patient denies significant dyspnea.  Denies orthopnea, PND, leg edema.  Scheduled for repeat labs with PCP upcoming.  Social History   Tobacco Use   Smoking status: Former    Packs/day: 1.00    Years: 20.00    Pack years: 20.00    Types: Cigarettes    Quit date: 07/26/1987    Years since quitting: 34.4   Smokeless tobacco: Never  Substance Use Topics   Alcohol use: No    Alcohol/week:  0.0 standard drinks  Marital Status: Married   ROS   Review of Systems  Constitutional: Positive for weight loss.  Cardiovascular:  Positive for dyspnea on exertion (mild) and leg swelling (minimal). Negative for chest pain, orthopnea, palpitations and syncope.   Objective  Blood pressure (!) 153/76, pulse 68, temperature 98.4 F (36.9 C), temperature source Temporal, resp. rate 17, height '5\' 4"'$  (1.626 m), weight 106 lb (48.1 kg), last menstrual period 07/25/1988, SpO2 98 %.     12/14/2021   10:44 AM 11/15/2021   10:07 AM 11/08/2021    2:52 PM  Vitals with BMI  Height '5\' 4"'$  '5\' 4"'$    Weight 106 lbs 121 lbs   BMI 61.60 73.71   Systolic 062 694 854  Diastolic 76 74 85  Pulse 68 75 78    Orthostatic VS for the past 72 hrs (Last 3 readings):  Patient Position BP Location Cuff Size  12/14/21 1044 Sitting Left Arm Normal    Physical Exam Vitals reviewed.  Constitutional:      Comments: Moderately build, frail  Neck:     Vascular: No carotid bruit or JVD.  Cardiovascular:     Rate and Rhythm: Normal rate and regular rhythm.     Pulses: Intact distal pulses.          Radial pulses are 2+ on the right side and 2+ on the left side.       Dorsalis pedis pulses are  1+ on the right side and 1+ on the left side.       Posterior tibial pulses are 1+ on the right side and 1+ on the left side.     Heart sounds: S1 normal and S2 normal. Murmur heard.  High-pitched blowing holosystolic murmur is present with a grade of 2/6 at the apex.  High-pitched blowing decrescendo early diastolic murmur is present with a grade of 3/4 at the upper right sternal border radiating to the apex.    No gallop.  Pulmonary:     Effort: No accessory muscle usage or respiratory distress.     Breath sounds: No rhonchi.  Musculoskeletal:     Right lower leg: Edema (trace) present.     Left lower leg: Edema (trace) present.   Laboratory examination:   Recent Labs    07/15/21 0447 07/27/21 1016 11/08/21 1325   NA 136 142 140  K 3.4* 3.7 3.9  CL 100 109 109  CO2 '27 26 24  '$ GLUCOSE 119* 87 94  BUN 26* 22 25*  CREATININE 0.99 0.94 0.70  CALCIUM 8.5* 9.0 8.5*  GFRNONAA 57* >60 >60   CrCl cannot be calculated (Patient's most recent lab result is older than the maximum 21 days allowed.).     Latest Ref Rng & Units 11/08/2021    1:25 PM 07/27/2021   10:16 AM 07/15/2021    4:47 AM  CMP  Glucose 70 - 99 mg/dL 94   87   119    BUN 8 - 23 mg/dL '25   22   26    '$ Creatinine 0.44 - 1.00 mg/dL 0.70   0.94   0.99    Sodium 135 - 145 mmol/L 140   142   136    Potassium 3.5 - 5.1 mmol/L 3.9   3.7   3.4    Chloride 98 - 111 mmol/L 109   109   100    CO2 22 - 32 mmol/L '24   26   27    '$ Calcium 8.9 - 10.3 mg/dL 8.5   9.0   8.5    Total Protein 6.5 - 8.1 g/dL  6.0     Total Bilirubin 0.3 - 1.2 mg/dL  0.8     Alkaline Phos 38 - 126 U/L  57     AST 15 - 41 U/L  15     ALT 0 - 44 U/L  11         Latest Ref Rng & Units 11/08/2021    1:25 PM 07/27/2021   10:16 AM 07/12/2021    9:57 AM  CBC  WBC 4.0 - 10.5 K/uL 14.0   6.9   7.2    Hemoglobin 12.0 - 15.0 g/dL 11.0   11.0   11.1    Hematocrit 36.0 - 46.0 % 37.5   35.8   36.3    Platelets 150 - 400 K/uL 316   243   250      TSH Recent Labs    01/26/21 1028 07/27/21 1016  TSH 17.100* 17.200*   BNP (last 3 results) Recent Labs    02/06/21 0828 07/12/21 0957 11/08/21 1325  BNP 1,741.5* 2,073.8* 2,098.6*   External labs:   Lipid Panel completed 04/09/2019 HDL 38 MG/DL 04/09/2019 LDL 80.000 mg 04/09/2019 Cholesterol, total 143.000 m 04/09/2019 Triglycerides 127.000 04/09/2019  A1C 5.000 07/31/2018  Glucose Random 110.000 m 11/04/2019 MicroAlbumin Urine 9.000 04/25/2019 MicroAlbumin/Creat 16.8 MG/ 04/25/2019 BUN 18.000 mg 11/04/2019 Creatinine, Serum 0.700  mg/ 11/04/2019  TSH 2.620 micr 11/04/2019  Allergies   Allergies  Allergen Reactions   Codeine Nausea Only   Epinephrine     Tachcardyia, chest pains tremors   Hydrocodone-Acetaminophen Other  (See Comments)   Tenormin [Atenolol] Rash    Final Medications at End of Visit     Current Outpatient Medications:    acetaminophen (TYLENOL) 500 MG tablet, Take 1,000 mg by mouth 3 (three) times daily as needed for moderate pain. , Disp: , Rfl:    amiodarone (PACERONE) 200 MG tablet, Take 0.5 tablets (100 mg total) by mouth daily., Disp: 90 tablet, Rfl: 3   ELIQUIS 2.5 MG TABS tablet, TAKE 1 TABLET BY MOUTH 2 TIMES A DAY, Disp: 60 tablet, Rfl: 3   ipratropium-albuterol (DUONEB) 0.5-2.5 (3) MG/3ML SOLN, Take 3 mLs by nebulization every 6 (six) hours as needed (shortness of breath or wheezing)., Disp: 360 mL, Rfl: 0   levothyroxine (SYNTHROID) 125 MCG tablet, Take 125 mcg by mouth daily., Disp: , Rfl:    losartan (COZAAR) 50 MG tablet, TAKE 1 TABLET BY MOUTH EACH NIGHT AT BEDTIME (Patient taking differently: Take 25 mg by mouth at bedtime.), Disp: 90 tablet, Rfl: 1   metoprolol succinate (TOPROL-XL) 25 MG 24 hr tablet, Take 25 mg by mouth daily. Take with or immediately following a meal., Disp: , Rfl:    Multiple Vitamins-Minerals (CENTRUM MINIS WOMEN 50+) TABS, Take 1 tablet by mouth daily., Disp: , Rfl:    pantoprazole (PROTONIX) 40 MG tablet, Take 40 mg by mouth daily as needed (indigestion/heartburn.). , Disp: , Rfl:    potassium chloride (KLOR-CON) 10 MEQ tablet, Take 2 tablets (20 mEq total) by mouth every other day. Take every time with furosemide (Patient taking differently: Take 20 mEq by mouth every 3 (three) days. Taking every three days with furosemide), Disp: 180 tablet, Rfl: 1   furosemide (LASIX) 20 MG tablet, Take 1 tablet (20 mg total) by mouth daily. Take 20 mg daily with additional doses if weight up by 3 Lbs or shortness of breath or swelling., Disp: 90 tablet, Rfl: 3    Radiology:   No results found.  Cardiac Studies:    Lexiscan Myoview stress test  04/23/2018: 1. The resting electrocardiogram demonstrated normal sinus rhythm, normal resting conduction and no resting  arrhythmias.  Stress EKG is non-diagnostic for ischemia as it a pharmacologic stress using Lexiscan. Occasional PAC and PVC. The patient developed significant symptoms which included Chest Pain, Stomach pain, Headache.  2. The overall quality of the study is good. There is no evidence of abnormal lung activity. Stress and rest SPECT images demonstrate homogeneous tracer distribution throughout the myocardium. Gated SPECT imaging reveals diffuse global hypokinesis. The left ventricular ejection fraction was markedly depressed at (26%).   3. This is a high risk study due to severe LV systolic dysfunction. Findings may represent non ischemic cardiomyopathy.  [facility] ECHOCARDIOGRAM COMPLETE 03/04/2021 Left ventricle cavity is normal in size. Moderate concentric hypertrophy of the left ventricle. Severe global hypokinesis. LV systolic function with visual EF 25-30%. Diastolic function not assessed due to severity of valvular regurgitation. Left atrial cavity is severely dilated. Structurally normal trileaflet aortic valve. No evidence of aortic stenosis. Moderate (Grade III) aortic regurgitation. Moderate (Grade III) mitral regurgitation. Moderate tricuspid regurgitation. Moderate pulmonic regurgitation. No evidence of pulmonary hypertension. No significant change compared to previous study on 07/23/2020.   EKG   09/23/2021: Normal sinus rhythm at rate of 70 bpm, left axis deviation, left anterior fascicular block.  Incomplete right bundle branch block.  Nonspecific T abnormality.  No significant change from 06/23/2021.  Assessment     ICD-10-CM   1. Chronic systolic heart failure (HCC)  I50.22 furosemide (LASIX) 20 MG tablet    2. AF (paroxysmal atrial fibrillation) (HCC)  I48.0       CHA2DS2-VASc Score is 5.  Yearly risk of stroke: 7.2% (A, F, HTN, CHF).  Score of 1=0.6; 2=2.2; 3=3.2; 4=4.8; 5=7.2; 6=9.8; 7=>9.8) -(CHF; HTN; vasc disease DM,  Female = 1; Age <65 =0; 65-74 = 1,  >75 =2;  stroke/embolism= 2).   Meds ordered this encounter  Medications   furosemide (LASIX) 20 MG tablet    Sig: Take 1 tablet (20 mg total) by mouth daily. Take 20 mg daily with additional doses if weight up by 3 Lbs or shortness of breath or swelling.    Dispense:  90 tablet    Refill:  3     Medications Discontinued During This Encounter  Medication Reason   levothyroxine (SYNTHROID, LEVOTHROID) 112 MCG tablet Change in therapy   furosemide (LASIX) 40 MG tablet Reorder     No orders of the defined types were placed in this encounter.  Recommendations:   Heidi Adams  is a 86 y.o. female  with bilateral hip replacement and knee replacement and left breast lumpectomy in 2018 without chemotherapy or radiation therapy for breast cancer and also remote hysterectomy due to uterine cancer in 1991, supine hypertension and orthostatic hypotension, paroxysmal atrial fibrillation on chronic Eliquis, chronic dizziness and orthostatic hypotension. Has not been able to tolerate any BP medications due to severe dizzy spells.  She also has history of A. fib with RVR SP direct-current cardioversion due to acute decompensated heart failure with severely systolic dysfunction on 11/25/2704 and also GI bleed at the time.  She is now tolerating Eliquis.   Patient presents for urgent visit at her request today with concerns of "rapid weight loss". Notably patient was previously not compliant with scheduled lasix dosing and ended up having worsening symptoms of SOB, and lower extremity swelling. Patient therefore agreeable at last discussion with our office to continue Lasix 40 mg every other day.  Given the patient was recently evaluated by PCP and he was concerned for volume overload, suspect weight loss is secondary to diuresis.  Explained to patient that her weight loss is related to correcting her volume overload state.  There is no clinical evidence of acute decompensated heart failure at today's office visit.  Will  reduce Lasix to 20 mg once daily with additional doses as needed.  Will defer repeat laboratory testing to PCP as previously scheduled.  We will schedule patient for echocardiogram as previously ordered.  She continues to tolerate anticoagulation without bleeding diathesis.  Follow-up as previously scheduled with Dr. [name].    Alethia Berthold, PA-C 12/14/2021, 11:23 AM Office: [phone]

## 2021-12-16 ENCOUNTER — Other Ambulatory Visit: Payer: Self-pay | Admitting: Cardiology

## 2021-12-22 DIAGNOSIS — E039 Hypothyroidism, unspecified: Secondary | ICD-10-CM | POA: Diagnosis not present

## 2021-12-22 DIAGNOSIS — K219 Gastro-esophageal reflux disease without esophagitis: Secondary | ICD-10-CM | POA: Diagnosis not present

## 2021-12-22 DIAGNOSIS — I480 Paroxysmal atrial fibrillation: Secondary | ICD-10-CM | POA: Diagnosis not present

## 2021-12-22 DIAGNOSIS — I110 Hypertensive heart disease with heart failure: Secondary | ICD-10-CM | POA: Diagnosis not present

## 2021-12-24 ENCOUNTER — Other Ambulatory Visit: Payer: PPO

## 2021-12-26 DIAGNOSIS — I10 Essential (primary) hypertension: Secondary | ICD-10-CM | POA: Diagnosis not present

## 2021-12-26 DIAGNOSIS — I5032 Chronic diastolic (congestive) heart failure: Secondary | ICD-10-CM | POA: Diagnosis not present

## 2021-12-26 DIAGNOSIS — I480 Paroxysmal atrial fibrillation: Secondary | ICD-10-CM | POA: Diagnosis not present

## 2021-12-30 ENCOUNTER — Ambulatory Visit: Payer: PPO

## 2021-12-30 DIAGNOSIS — I5022 Chronic systolic (congestive) heart failure: Secondary | ICD-10-CM

## 2021-12-31 ENCOUNTER — Ambulatory Visit: Payer: PPO | Admitting: Podiatrist

## 2021-12-31 ENCOUNTER — Encounter: Payer: Self-pay | Admitting: Podiatrist

## 2021-12-31 DIAGNOSIS — L600 Ingrowing nail: Secondary | ICD-10-CM | POA: Diagnosis not present

## 2021-12-31 DIAGNOSIS — L03032 Cellulitis of left toe: Secondary | ICD-10-CM | POA: Diagnosis not present

## 2021-12-31 MED ORDER — CEPHALEXIN 500 MG PO CAPS: 500.0000 mg | ORAL_CAPSULE | Freq: Two times a day (BID) | ORAL | 0 refills | Status: DC

## 2021-12-31 NOTE — Progress Notes (Signed)
Chief Complaint  Patient presents with   Ingrown Toenail     Ingrown toe nail L foot     HPI: Patient is 86 y.o. female who presents today for pain left great toe medial side- slant back , keflex, soaks  Patient Active Problem List   Diagnosis Date Noted   Community acquired pneumonia 07/13/2021   Pneumonia 07/13/2021   Influenza A 07/12/2021   Acute on chronic systolic CHF (congestive heart failure) ([place]) 07/12/2021   Hypocalcemia 07/12/2021   Normocytic anemia 07/12/2021   GERD (gastroesophageal reflux disease) 07/12/2021   Vertigo 04/25/2021   Acute lower UTI 04/25/2021   Nonischemic cardiomyopathy ([place]) 02/24/2021   Orthostatic hypotension 35/59/7416   Acute systolic CHF (congestive heart failure) ([place]) 02/06/2021   Acute CHF (congestive heart failure) ([place]) 02/06/2021   Family history of colonic polyps 10/12/2020   Gastroesophageal reflux disease with esophagitis and hemorrhage 10/12/2020   Hemorrhage of rectum and anus 10/12/2020   Slow transit constipation 10/12/2020   Iron deficiency anemia due to chronic blood loss 06/24/2020   E coli bacteremia 03/23/2020   A-fib ([name]) 03/23/2020   Acute cystitis 03/22/2020   Atrial fibrillation with RVR ([place]) 03/22/2020   History of congestive heart failure 03/22/2020   Elevated troponin    Swelling of right foot 10/25/2019   Dizziness 06/22/2019   Arthritis of wrist 38/45/3646   Chronic systolic heart failure ([place]) 09/22/2018   DDD (degenerative disc disease), cervical 08/20/2018   Left elbow pain 08/02/2018   Neck pain 08/02/2018   TIA (transient ischemic attack) 07/30/2018   Hypertensive urgency 07/30/2018   AF (paroxysmal atrial fibrillation) ([place]) 07/30/2018   Arthritis of carpometacarpal ([place]) joint of left thumb 06/20/2018   Pain in thumb joint with movement of left hand 05/08/2018   Pain in finger of right hand 01/02/2018   Pain in left knee 11/08/2017   Conductive hearing loss, bilateral 10/19/2017   Dry nose  10/19/2017   Chronic hip pain after total replacement of left hip joint 08/25/2017   Trochanteric bursitis of left hip 08/25/2017   Localized, primary osteoarthritis of elbow 08/22/2017   Synovitis and tenosynovitis 08/11/2017   Malignant neoplasm of upper-inner quadrant of left breast in female, estrogen receptor positive ([place]) 06/20/2017   S/P closed reduction of dislocated total hip prosthesis 02/03/2017   S/P left THA, AA 01/10/2017   Fatigue 05/31/2016   Overweight (BMI 25.0-29.9) 05/11/2016   S/P right THA, AA 05/10/2016   Right hip pain 03/16/2016   Pes planus 03/16/2016   Abnormality of gait 03/16/2016   PAC (premature atrial contraction) 02/24/2016   PVC (premature ventricular contraction) 02/24/2016   Palpitations 01/27/2016   Benign paroxysmal positional vertigo of right ear 12/15/2015   Pharyngoesophageal dysphagia 12/15/2015   Hypothyroidism 11/26/2015   Supine hypertension 11/26/2015   S/P left TKA 80/32/1224   Lichen sclerosus 82/50/0370    Current Outpatient Medications on File Prior to Visit  Medication Sig Dispense Refill   acetaminophen (TYLENOL) 500 MG tablet Take 1,000 mg by mouth 3 (three) times daily as needed for moderate pain.      amiodarone (PACERONE) 200 MG tablet Take 0.5 tablets (100 mg total) by mouth daily. 90 tablet 3   ELIQUIS 2.5 MG TABS tablet TAKE 1 TABLET BY MOUTH 2 TIMES A DAY 60 tablet 3   furosemide (LASIX) 20 MG tablet Take 1 tablet (20 mg total) by mouth daily. Take 20 mg daily with additional doses if weight up by 3 Lbs or shortness of  breath or swelling. 90 tablet 3   ipratropium-albuterol (DUONEB) 0.5-2.5 (3) MG/3ML SOLN Take 3 mLs by nebulization every 6 (six) hours as needed (shortness of breath or wheezing). 360 mL 0   levothyroxine (SYNTHROID) 125 MCG tablet Take 125 mcg by mouth daily.     losartan (COZAAR) 50 MG tablet TAKE 1 TABLET BY MOUTH EACH NIGHT AT BEDTIME (Patient taking differently: Take 25 mg by mouth at bedtime.) 90  tablet 1   metoprolol succinate (TOPROL-XL) 25 MG 24 hr tablet Take 25 mg by mouth daily. Take with or immediately following a meal.     Multiple Vitamins-Minerals (CENTRUM MINIS WOMEN 50+) TABS Take 1 tablet by mouth daily.     pantoprazole (PROTONIX) 40 MG tablet Take 40 mg by mouth daily as needed (indigestion/heartburn.).      potassium chloride (KLOR-CON) 10 MEQ tablet Take 2 tablets (20 mEq total) by mouth every other day. Take every time with furosemide (Patient taking differently: Take 20 mEq by mouth every 3 (three) days. Taking every three days with furosemide) 180 tablet 1   potassium chloride (KLOR-CON) 10 MEQ tablet TAKE 1 TABLET BY MOUTH 2 TIMES A DAY 60 tablet 0   No current facility-administered medications on file prior to visit.    Allergies  Allergen Reactions   Codeine Nausea Only   Epinephrine     Tachcardyia, chest pains tremors   Hydrocodone-Acetaminophen Other (See Comments)   Tenormin [Atenolol] Rash    Review of Systems No fevers, chills, nausea, muscle aches, no difficulty breathing, no calf pain, no chest pain or shortness of breath.   Physical Exam  GENERAL APPEARANCE: Alert, conversant. Appropriately groomed. No acute distress.   VASCULAR: Pedal pulses palpable DP and PT bilateral.  Capillary refill time is immediate to all digits,  Proximal to distal cooling it warm to warm.  Digital perfusion adequate.   DERMATOLOGIC: skin is warm, supple, and dry.  Xerotic skin bilateral noted.  Right hallux nail medial border is mildly red and ingrown on the distal aspect of the nail. No drainage noted.  No cellulitis noted.      Assessment     ICD-10-CM   1. Ingrown nail  L60.0     2. Paronychia of great toe, left  L03.032        Plan  Discussed exam findings with the patient and her husband.  I gave her the option of a slant back procedure versus a permanent phenol matrixectomy and she would like to try the conservative approach first.  At today's visit  I did do an slant back procedure on the right great toenail to relieve the pressure.  I also debrided the remainder of the toenails as a courtesy.  I am starting her on cephalexin 500 mg 1 tab twice daily for 10 days along with Epsom salt soaks.  Should this fail to relieve the pain she will call for further treatment otherwise she will be seen back as needed for follow-up.

## 2021-12-31 NOTE — Patient Instructions (Signed)
Soak Instructions    THE DAY AFTER THE PROCEDURE  Place 1/4 cup of epsom salts in a quart of warm tap water.  Submerge your foot and soak in the solution for 20 minutes.  This soak should be done twice a day.  Next, remove your foot or feet from solution, blot dry the affected area and cover.  Apply polysporin or neosporin.   You may use a band aid large enough to cover the area or use gauze and tape.     If your toe becomes red, swollen, or you see drainage, please call to be seen.

## 2022-01-11 ENCOUNTER — Ambulatory Visit: Payer: PPO | Admitting: Podiatrist

## 2022-01-11 ENCOUNTER — Encounter: Payer: Self-pay | Admitting: Podiatrist

## 2022-01-11 DIAGNOSIS — L600 Ingrowing nail: Secondary | ICD-10-CM

## 2022-01-13 DIAGNOSIS — D692 Other nonthrombocytopenic purpura: Secondary | ICD-10-CM | POA: Diagnosis not present

## 2022-01-13 DIAGNOSIS — N390 Urinary tract infection, site not specified: Secondary | ICD-10-CM | POA: Diagnosis not present

## 2022-01-14 DIAGNOSIS — R5383 Other fatigue: Secondary | ICD-10-CM | POA: Diagnosis not present

## 2022-01-14 DIAGNOSIS — D649 Anemia, unspecified: Secondary | ICD-10-CM | POA: Diagnosis not present

## 2022-01-14 DIAGNOSIS — R7989 Other specified abnormal findings of blood chemistry: Secondary | ICD-10-CM | POA: Diagnosis not present

## 2022-01-14 DIAGNOSIS — I10 Essential (primary) hypertension: Secondary | ICD-10-CM | POA: Diagnosis not present

## 2022-01-14 DIAGNOSIS — E039 Hypothyroidism, unspecified: Secondary | ICD-10-CM | POA: Diagnosis not present

## 2022-01-14 DIAGNOSIS — E785 Hyperlipidemia, unspecified: Secondary | ICD-10-CM | POA: Diagnosis not present

## 2022-01-18 NOTE — Progress Notes (Signed)
Chief Complaint  Patient presents with   Nail Problem    Both of the big toes hurt and the left is draining      HPI: Patient is 86 y.o. female who presents today for pain in bilateral great toes.  She relates the borders of the toenails are growing into the skin and causing pain.  I did debride them about 2 weeks ago however the pain has not subsided.   Allergies  Allergen Reactions   Codeine Nausea Only   Epinephrine     Tachcardyia, chest pains tremors   Hydrocodone-Acetaminophen Other (See Comments)   Tenormin [Atenolol] Rash    Review of systems is negative except as noted in the HPI.  Denies nausea/ vomiting/ fevers/ chills or night sweats.   Denies difficulty breathing, denies calf pain or tenderness  Physical Exam  Patient is awake, alert, and oriented x 3.  In no acute distress.    Neurovascular status is intact and unchanged from the last visit.  Bilateral hallux nails continue to be incurvated and painful to the medial lateral nail border with the left being the most symptomatic.    Assessment:   ICD-10-CM   1. Ingrown nail  L60.0        Plan: Discussed treatment recommendations with the patient and her husband.  At today's visit I recommended anesthetizing the left great toe so that I can do a thorough slant back procedure she did agree I prepped the skin with alcohol infiltrated lidocaine Marcaine mixture in a digital block fashion and once anesthetized I did a slant back on the medial and lateral nail borders.  She tolerated this well no iatrogenic bleeding was noted.  Recommended some Epsom salt soaks for the next few days and if the pain continues she will call where at that time a permanent phenol matrixectomy will be recommended.  Otherwise she will be seen back as needed for further follow-up.

## 2022-01-24 ENCOUNTER — Ambulatory Visit: Payer: PPO | Admitting: Cardiology

## 2022-01-25 DIAGNOSIS — I5032 Chronic diastolic (congestive) heart failure: Secondary | ICD-10-CM | POA: Diagnosis not present

## 2022-01-25 DIAGNOSIS — I10 Essential (primary) hypertension: Secondary | ICD-10-CM | POA: Diagnosis not present

## 2022-01-25 DIAGNOSIS — I480 Paroxysmal atrial fibrillation: Secondary | ICD-10-CM | POA: Diagnosis not present

## 2022-01-26 ENCOUNTER — Encounter: Payer: Self-pay | Admitting: Cardiology

## 2022-01-26 ENCOUNTER — Other Ambulatory Visit: Payer: Self-pay | Admitting: Student

## 2022-01-26 ENCOUNTER — Ambulatory Visit: Payer: PPO | Admitting: Cardiology

## 2022-01-26 VITALS — BP 163/80 | HR 78 | Temp 98.0°F | Resp 16 | Ht 64.0 in | Wt 106.0 lb

## 2022-01-26 DIAGNOSIS — I951 Orthostatic hypotension: Secondary | ICD-10-CM

## 2022-01-26 DIAGNOSIS — I5022 Chronic systolic (congestive) heart failure: Secondary | ICD-10-CM

## 2022-01-26 DIAGNOSIS — I480 Paroxysmal atrial fibrillation: Secondary | ICD-10-CM

## 2022-01-26 DIAGNOSIS — I10 Essential (primary) hypertension: Secondary | ICD-10-CM | POA: Diagnosis not present

## 2022-01-26 DIAGNOSIS — Z87440 Personal history of urinary (tract) infections: Secondary | ICD-10-CM | POA: Diagnosis not present

## 2022-01-26 DIAGNOSIS — S61402A Unspecified open wound of left hand, initial encounter: Secondary | ICD-10-CM | POA: Diagnosis not present

## 2022-01-26 DIAGNOSIS — Z8709 Personal history of other diseases of the respiratory system: Secondary | ICD-10-CM | POA: Diagnosis not present

## 2022-01-26 DIAGNOSIS — Z09 Encounter for follow-up examination after completed treatment for conditions other than malignant neoplasm: Secondary | ICD-10-CM | POA: Diagnosis not present

## 2022-01-26 DIAGNOSIS — Z8679 Personal history of other diseases of the circulatory system: Secondary | ICD-10-CM | POA: Diagnosis not present
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
## 2022-01-29 DIAGNOSIS — S61412A Laceration without foreign body of left hand, initial encounter: Secondary | ICD-10-CM | POA: Diagnosis not present

## 2022-01-31 ENCOUNTER — Other Ambulatory Visit (HOSPITAL_COMMUNITY): Payer: Self-pay | Admitting: *Deleted

## 2022-01-31 ENCOUNTER — Other Ambulatory Visit: Payer: Self-pay | Admitting: Cardiology

## 2022-02-01 ENCOUNTER — Encounter (HOSPITAL_COMMUNITY)
Admission: RE | Admit: 2022-02-01 | Discharge: 2022-02-01 | Disposition: A | Discharge status: Home / Self Care | Payer: PPO | Source: Ambulatory Visit | Attending: Internal Medicine | Admitting: Internal Medicine

## 2022-02-01 DIAGNOSIS — D649 Anemia, unspecified: Secondary | ICD-10-CM | POA: Insufficient documentation

## 2022-02-01 MED ORDER — SODIUM CHLORIDE 0.9 % IV SOLN
510.0000 mg | INTRAVENOUS | Status: DC
  Administered 2022-02-01: 510 mg via INTRAVENOUS
  Filled 2022-02-01: qty 510

## 2022-02-04 DIAGNOSIS — S61412D Laceration without foreign body of left hand, subsequent encounter: Secondary | ICD-10-CM | POA: Diagnosis not present

## 2022-02-08 ENCOUNTER — Encounter (HOSPITAL_COMMUNITY)
Admission: RE | Admit: 2022-02-08 | Discharge: 2022-02-08 | Disposition: A | Discharge status: Home / Self Care | Payer: PPO | Source: Ambulatory Visit | Attending: Internal Medicine | Admitting: Internal Medicine

## 2022-02-08 ENCOUNTER — Other Ambulatory Visit: Payer: Self-pay | Admitting: Cardiology

## 2022-02-08 DIAGNOSIS — D649 Anemia, unspecified: Secondary | ICD-10-CM | POA: Diagnosis not present

## 2022-02-08 DIAGNOSIS — I5022 Chronic systolic (congestive) heart failure: Secondary | ICD-10-CM

## 2022-02-08 MED ORDER — SODIUM CHLORIDE 0.9 % IV SOLN
510.0000 mg | INTRAVENOUS | Status: DC
  Administered 2022-02-08: 510 mg via INTRAVENOUS
  Filled 2022-02-08: qty 510

## 2022-02-10 DIAGNOSIS — H04123 Dry eye syndrome of bilateral lacrimal glands: Secondary | ICD-10-CM | POA: Diagnosis not present

## 2022-02-14 DIAGNOSIS — M25522 Pain in left elbow: Secondary | ICD-10-CM | POA: Diagnosis not present

## 2022-02-14 DIAGNOSIS — W1830XA Fall on same level, unspecified, initial encounter: Secondary | ICD-10-CM | POA: Diagnosis not present

## 2022-02-14 DIAGNOSIS — M47817 Spondylosis without myelopathy or radiculopathy, lumbosacral region: Secondary | ICD-10-CM | POA: Diagnosis not present

## 2022-02-14 DIAGNOSIS — M13822 Other specified arthritis, left elbow: Secondary | ICD-10-CM | POA: Diagnosis not present

## 2022-02-14 DIAGNOSIS — S0990XA Unspecified injury of head, initial encounter: Secondary | ICD-10-CM | POA: Diagnosis not present

## 2022-02-14 DIAGNOSIS — M542 Cervicalgia: Secondary | ICD-10-CM | POA: Diagnosis not present

## 2022-02-14 DIAGNOSIS — R519 Headache, unspecified: Secondary | ICD-10-CM | POA: Diagnosis not present

## 2022-02-14 DIAGNOSIS — W1789XA Other fall from one level to another, initial encounter: Secondary | ICD-10-CM | POA: Diagnosis not present

## 2022-02-14 DIAGNOSIS — Z96642 Presence of left artificial hip joint: Secondary | ICD-10-CM | POA: Diagnosis not present

## 2022-02-14 DIAGNOSIS — R930 Abnormal findings on diagnostic imaging of skull and head, not elsewhere classified: Secondary | ICD-10-CM | POA: Diagnosis not present

## 2022-02-14 DIAGNOSIS — M25552 Pain in left hip: Secondary | ICD-10-CM | POA: Diagnosis not present

## 2022-02-14 DIAGNOSIS — Y998 Other external cause status: Secondary | ICD-10-CM | POA: Diagnosis not present

## 2022-02-14 DIAGNOSIS — Z23 Encounter for immunization: Secondary | ICD-10-CM | POA: Diagnosis not present

## 2022-02-25 DIAGNOSIS — I10 Essential (primary) hypertension: Secondary | ICD-10-CM | POA: Diagnosis not present

## 2022-02-25 DIAGNOSIS — I5032 Chronic diastolic (congestive) heart failure: Secondary | ICD-10-CM | POA: Diagnosis not present

## 2022-02-25 DIAGNOSIS — I480 Paroxysmal atrial fibrillation: Secondary | ICD-10-CM | POA: Diagnosis not present

## 2022-03-03 DIAGNOSIS — D692 Other nonthrombocytopenic purpura: Secondary | ICD-10-CM | POA: Diagnosis not present

## 2022-03-03 DIAGNOSIS — D6869 Other thrombophilia: Secondary | ICD-10-CM | POA: Diagnosis not present

## 2022-03-03 DIAGNOSIS — Z66 Do not resuscitate: Secondary | ICD-10-CM | POA: Diagnosis not present

## 2022-03-03 DIAGNOSIS — Z515 Encounter for palliative care: Secondary | ICD-10-CM | POA: Diagnosis not present

## 2022-03-03 DIAGNOSIS — I480 Paroxysmal atrial fibrillation: Secondary | ICD-10-CM | POA: Diagnosis not present

## 2022-03-03 DIAGNOSIS — Z7901 Long term (current) use of anticoagulants: Secondary | ICD-10-CM | POA: Diagnosis not present

## 2022-03-06 ENCOUNTER — Emergency Department (HOSPITAL_COMMUNITY)
Admission: EM | Admit: 2022-03-06 | Discharge: 2022-03-06 | Disposition: A | Discharge status: Home / Self Care | Payer: PPO | Attending: Emergency Medicine | Admitting: Emergency Medicine

## 2022-03-06 ENCOUNTER — Other Ambulatory Visit: Payer: Self-pay

## 2022-03-06 ENCOUNTER — Emergency Department (HOSPITAL_COMMUNITY): Payer: PPO

## 2022-03-06 ENCOUNTER — Encounter (HOSPITAL_COMMUNITY): Payer: Self-pay

## 2022-03-06 DIAGNOSIS — R609 Edema, unspecified: Secondary | ICD-10-CM

## 2022-03-06 DIAGNOSIS — I509 Heart failure, unspecified: Secondary | ICD-10-CM | POA: Diagnosis not present

## 2022-03-06 DIAGNOSIS — R600 Localized edema: Secondary | ICD-10-CM | POA: Diagnosis not present

## 2022-03-06 DIAGNOSIS — Z20822 Contact with and (suspected) exposure to covid-19: Secondary | ICD-10-CM | POA: Diagnosis not present

## 2022-03-06 DIAGNOSIS — Z853 Personal history of malignant neoplasm of breast: Secondary | ICD-10-CM | POA: Insufficient documentation

## 2022-03-06 DIAGNOSIS — Z79899 Other long term (current) drug therapy: Secondary | ICD-10-CM | POA: Diagnosis not present

## 2022-03-06 DIAGNOSIS — M7989 Other specified soft tissue disorders: Secondary | ICD-10-CM | POA: Diagnosis present

## 2022-03-06 DIAGNOSIS — Z7901 Long term (current) use of anticoagulants: Secondary | ICD-10-CM | POA: Insufficient documentation

## 2022-03-06 DIAGNOSIS — R0602 Shortness of breath: Secondary | ICD-10-CM | POA: Diagnosis not present

## 2022-03-06 LAB — COMPREHENSIVE METABOLIC PANEL
ALT: 12 U/L (ref 0–44)
AST: 16 U/L (ref 15–41)
Albumin: 3.3 g/dL — ABNORMAL LOW (ref 3.5–5.0)
Alkaline Phosphatase: 64 U/L (ref 38–126)
Anion gap: 9 (ref 5–15)
BUN: 23 mg/dL (ref 8–23)
CO2: 28 mmol/L (ref 22–32)
Calcium: 8.9 mg/dL (ref 8.9–10.3)
Chloride: 107 mmol/L (ref 98–111)
Creatinine, Ser: 1.07 mg/dL — ABNORMAL HIGH (ref 0.44–1.00)
GFR, Estimated: 52 mL/min — ABNORMAL LOW (ref >60)
Glucose, Bld: 98 mg/dL (ref 70–99)
Potassium: 3.8 mmol/L (ref 3.5–5.1)
Sodium: 144 mmol/L (ref 135–145)
Total Bilirubin: 0.8 mg/dL (ref 0.3–1.2)
Total Protein: 5.8 g/dL — ABNORMAL LOW (ref 6.5–8.1)

## 2022-03-06 LAB — URINALYSIS, ROUTINE W REFLEX MICROSCOPIC
Bilirubin Urine: NEGATIVE
Glucose, UA: NEGATIVE mg/dL
Ketones, ur: NEGATIVE mg/dL
Nitrite: NEGATIVE
Protein, ur: NEGATIVE mg/dL
Specific Gravity, Urine: 1.011 (ref 1.005–1.030)
pH: 6 (ref 5.0–8.0)

## 2022-03-06 LAB — CBC WITH DIFFERENTIAL/PLATELET
Abs Immature Granulocytes: 0.04 10*3/uL (ref 0.00–0.07)
Basophils Absolute: 0 10*3/uL (ref 0.0–0.1)
Basophils Relative: 1 %
Eosinophils Absolute: 0.1 10*3/uL (ref 0.0–0.5)
Eosinophils Relative: 2 %
HCT: 43.2 % (ref 36.0–46.0)
Hemoglobin: 12.6 g/dL (ref 12.0–15.0)
Immature Granulocytes: 1 %
Lymphocytes Relative: 19 %
Lymphs Abs: 1.1 10*3/uL (ref 0.7–4.0)
MCH: 27.7 pg (ref 26.0–34.0)
MCHC: 29.2 g/dL — ABNORMAL LOW (ref 30.0–36.0)
MCV: 94.9 fL (ref 80.0–100.0)
Monocytes Absolute: 0.2 10*3/uL (ref 0.1–1.0)
Monocytes Relative: 4 %
Neutro Abs: 4.2 10*3/uL (ref 1.7–7.7)
Neutrophils Relative %: 73 %
Platelets: 165 10*3/uL (ref 150–400)
RBC: 4.55 MIL/uL (ref 3.87–5.11)
RDW: 23.8 % — ABNORMAL HIGH (ref 11.5–15.5)
WBC: 5.8 10*3/uL (ref 4.0–10.5)
nRBC: 0 % (ref 0.0–0.2)

## 2022-03-06 LAB — BRAIN NATRIURETIC PEPTIDE: B Natriuretic Peptide: 2638.9 pg/mL — ABNORMAL HIGH (ref 0.0–100.0)

## 2022-03-06 LAB — TROPONIN I (HIGH SENSITIVITY): Troponin I (High Sensitivity): 15 ng/L (ref <18)

## 2022-03-06 LAB — SARS CORONAVIRUS 2 BY RT PCR: SARS Coronavirus 2 by RT PCR: NEGATIVE

## 2022-03-06 MED ORDER — POTASSIUM CHLORIDE CRYS ER 20 MEQ PO TBCR
20.0000 meq | EXTENDED_RELEASE_TABLET | Freq: Once | ORAL | Status: AC
  Administered 2022-03-06: 20 meq via ORAL
  Filled 2022-03-06: qty 1

## 2022-03-06 MED ORDER — FUROSEMIDE 10 MG/ML IJ SOLN
40.0000 mg | Freq: Once | INTRAMUSCULAR | Status: AC
  Administered 2022-03-06: 40 mg via INTRAVENOUS
  Filled 2022-03-06: qty 4

## 2022-03-06 NOTE — ED Notes (Signed)
Pt states understanding of dc instructions, importance of follow up. Pt denies questions or concerns upon dc. Pt assisted into wheelchair and wheeled out to private vehicle. No belongings left in room upon dc.  

## 2022-03-06 NOTE — ED Triage Notes (Signed)
Pt reports bilateral lower leg swelling and weeping x few days. Hx chf

## 2022-03-06 NOTE — ED Notes (Signed)
Pt ambulated w/ assistance of walker w/ standby assistance from ED RN.  Pt's oxygen saturations remained between 96-99% on RA.  MD Melina Copa notified and aware.  Pt c/o lower abd pain, which pt states started prior to her arrival to ed and a "knot" on her left hip.  MD Melina Copa notified of new pt complaints.

## 2022-03-06 NOTE — ED Provider Notes (Signed)
[facility] DEPT Provider Note   CSN: [id number] Arrival date & time: 03/06/22  0813     History  Chief Complaint  Patient presents with   Leg Swelling    Heidi Adams is a 86 y.o. female.  She has a history of A-fib on Eliquis, CHF, breast cancer, chronic dizziness.  She follows with Dr. [name] from cardiology.  She is complaining of increased shortness of breath over the last few days along with worsening swelling of her lower extremities.  She said they have been leaking fluid and messing up the bed sheets.  She possibly had a minor fall in which she cut her leg and that is been leaking.  She denies any chest pain abdominal pain vomiting diarrhea.  She said she restarted her Lasix a few days ago because of the shortness of breath.  Unclear if she has been taking it regularly before that.  She does not check her daily weights.  No fevers or chills.  The history is provided by the patient.  Shortness of Breath Severity:  Moderate Onset quality:  Gradual Duration:  5 days Timing:  Constant Progression:  Unchanged Chronicity:  Recurrent Relieved by:  Nothing Worsened by:  Activity Ineffective treatments:  Diuretics Associated symptoms: no abdominal pain, no chest pain, no fever, no hemoptysis, no sputum production and no vomiting        Home Medications Prior to Admission medications   Medication Sig Start Date End Date Taking? Authorizing Provider  acetaminophen (TYLENOL) 500 MG tablet Take 1,000 mg by mouth 3 (three) times daily as needed for moderate pain.     [provider]  amiodarone (PACERONE) 200 MG tablet Take 0.5 tablets (100 mg total) by mouth daily. 09/06/21   Adrian Prows, MD  ELIQUIS 2.5 MG TABS tablet TAKE 1 TABLET BY MOUTH 2 TIMES A DAY 01/26/22   Adrian Prows, MD  furosemide (LASIX) 20 MG tablet Take 1 tablet (20 mg total) by mouth daily. Take 20 mg daily with additional doses if weight up by 3 Lbs or shortness of breath or  swelling. 12/14/21   Cantwell, Celeste C, PA-C  ipratropium-albuterol (DUONEB) 0.5-2.5 (3) MG/3ML SOLN Take 3 mLs by nebulization every 6 (six) hours as needed (shortness of breath or wheezing). 07/15/21   Arrien, Jimmy Picket, MD  levothyroxine (SYNTHROID) 125 MCG tablet Take 125 mcg by mouth daily. 10/27/21   [provider]  losartan (COZAAR) 50 MG tablet TAKE 1 TABLET BY MOUTH EACH NIGHT AT BEDTIME 02/08/22   Adrian Prows, MD  metoprolol succinate (TOPROL-XL) 25 MG 24 hr tablet TAKE 1 TABLET BY MOUTH EVERY DAY 01/31/22   Adrian Prows, MD  pantoprazole (PROTONIX) 40 MG tablet Take 40 mg by mouth daily as needed (indigestion/heartburn.).  01/20/16   [provider]  potassium chloride (KLOR-CON) 10 MEQ tablet Take 2 tablets (20 mEq total) by mouth every other day. Take every time with furosemide Patient taking differently: Take 20 mEq by mouth every 3 (three) days. Taking every three days with furosemide 02/24/21   Adrian Prows, MD  potassium chloride (KLOR-CON) 10 MEQ tablet TAKE 1 TABLET BY MOUTH 2 TIMES A DAY 12/16/21   Adrian Prows, MD      Allergies    Codeine, Epinephrine, Hydrocodone-acetaminophen, and Tenormin [atenolol]    Review of Systems   Review of Systems  Constitutional:  Negative for fever.  Respiratory:  Positive for shortness of breath. Negative for hemoptysis and sputum production.   Cardiovascular:  Positive for leg swelling. Negative for chest pain.  Gastrointestinal:  Negative for abdominal pain and vomiting.  Skin:  Positive for wound.    Physical Exam Updated Vital Signs BP (!) 174/76 (BP Location: Left Arm)   Pulse 62   Temp 98 F (36.7 C) (Oral)   Resp 18   Ht '5\' 4"'$  (1.626 m)   Wt 54.9 kg   LMP 07/25/1988 Comment: full   SpO2 98%   BMI 20.77 kg/m  Physical Exam Vitals and nursing note reviewed.  Constitutional:      General: She is not in acute distress.    Appearance: Normal appearance. She is well-developed.  HENT:     Head: Normocephalic  and atraumatic.  Eyes:     Conjunctiva/sclera: Conjunctivae normal.  Cardiovascular:     Rate and Rhythm: Normal rate and regular rhythm.     Heart sounds: Murmur heard.  Pulmonary:     Effort: Pulmonary effort is normal. No respiratory distress.     Breath sounds: Normal breath sounds.  Abdominal:     Palpations: Abdomen is soft.     Tenderness: There is no abdominal tenderness. There is no guarding or rebound.  Musculoskeletal:        General: No swelling.     Cervical back: Neck supple.     Right lower leg: Edema present.     Left lower leg: Edema present.     Comments: She has edema and chronic stasis changes of both her lower extremities.  There is some bruising and a small wound.  Leaking clear fluid.  Distal pulses intact.  No cords appreciated nontender.  Skin:    General: Skin is warm and dry.     Capillary Refill: Capillary refill takes less than 2 seconds.  Neurological:     General: No focal deficit present.     Mental Status: She is alert.     ED Results / Procedures / Treatments   Labs (all labs ordered are listed, but only abnormal results are displayed) Labs Reviewed  COMPREHENSIVE METABOLIC PANEL - Abnormal; Notable for the following components:      Result Value   Creatinine, Ser 1.07 (*)    Total Protein 5.8 (*)    Albumin 3.3 (*)    GFR, Estimated 52 (*)    All other components within normal limits  CBC WITH DIFFERENTIAL/PLATELET - Abnormal; Notable for the following components:   MCHC 29.2 (*)    RDW 23.8 (*)    All other components within normal limits  BRAIN NATRIURETIC PEPTIDE - Abnormal; Notable for the following components:   B Natriuretic Peptide 2,638.9 (*)    All other components within normal limits  URINALYSIS, ROUTINE W REFLEX MICROSCOPIC - Abnormal; Notable for the following components:   APPearance HAZY (*)    Hgb urine dipstick MODERATE (*)    Leukocytes,Ua LARGE (*)    Bacteria, UA FEW (*)    All other components within normal  limits  SARS CORONAVIRUS 2 BY RT PCR  TROPONIN I (HIGH SENSITIVITY)    EKG EKG Interpretation  Date/Time:  Sunday March 06 2022 08:31:14 EDT Ventricular Rate:  63 PR Interval:  196 QRS Duration: 106 QT Interval:  417 QTC Calculation: 427 R Axis:   -73 Text Interpretation: Sinus rhythm Left anterior fascicular block Probable LVH with secondary repol abnrm Anterior Q waves, possibly due to LVH No significant change since prior 12/22 Confirmed by Aletta Edouard [phone]) on 03/06/2022 8:49:04 AM  Radiology DG Chest  Port 1 View  Result Date: 03/06/2022 CLINICAL DATA:  sob EXAM: PORTABLE CHEST 1 VIEW COMPARISON:  November 08, 2021 FINDINGS: Again seen is the moderate cardiomegaly with some calcifications at the arch of the aorta. Lungs remain clear. IMPRESSION: Moderate cardiomegaly.  No acute pleural or pulmonary process. Electronically Signed   By: Frazier Richards M.D.   On: 03/06/2022 09:22    Procedures Procedures    Medications Ordered in ED Medications  furosemide (LASIX) injection 40 mg (40 mg Intravenous Given 03/06/22 1012)  potassium chloride SA (KLOR-CON M) CR tablet 20 mEq (20 mEq Oral Given 03/06/22 1011)    ED Course/ Medical Decision Making/ A&P Clinical Course as of 03/06/22 1707  Sun Mar 06, 2022  0921 Chest x-ray showing cardiomegaly no gross infiltrates.  Awaiting radiology reading. [MB]  0539 Patient apparently ambulated fair with walker and assistance.  Was unsteady.  Reportedly has no complaints now.  Will need reassessment. [MB]  7673 When I went to see the patient she said she is feeling about the same or a little bit better.  She is asking to be discharged.  It sounds like she is only taking her diuretics when she wants to and only taking a half a pill at.  Counseled patient she will need to take a full pill for the next few days and follow-up with her doctors.  She is comfortable plan. [MB]    Clinical Course User Index [MB] Hayden Rasmussen, MD                            Medical Decision Making Amount and/or Complexity of Data Reviewed Labs: ordered. Radiology: ordered.  Risk Prescription drug management.  This patient complains of swelling and weeping, shortness of breath; this involves an extensive number of treatment Options and is a complaint that carries with it a high risk of complications and morbidity. The differential includes COPD, CHF, peripheral edema, metabolic derangement, cellulitis DVT  I ordered, reviewed and interpreted labs, which included CBC with normal white count normal hemoglobin, chemistries normal although mildly elevated creatinine opponents flat, BNP significantly elevated, COVID-negative, urinalysis without clear signs of infection I ordered medication IV Lasix oral potassium and reviewed PMP when indicated. I ordered imaging studies which included chest x-ray and I independently    visualized and interpreted imaging which showed COPD Additional history obtained from patient's husband Previous records obtained and reviewed in epic including prior primary care notes  Cardiac monitoring reviewed, sinus rhythm Social determinants considered, no significant barriers Critical Interventions: None  After the interventions stated above, I reevaluated the patient and found patient to be fairly asymptomatic with stable vital signs Admission and further testing considered, she will need further diuresis.  Patient would rather be discharged and take her Lasix at home.  We will follow-up with her PCP.  Return instructions discussed.          Final Clinical Impression(s) / ED Diagnoses Final diagnoses:  Peripheral edema    Rx / DC Orders ED Discharge Orders     None         Hayden Rasmussen, MD 03/06/22 1710

## 2022-03-06 NOTE — Discharge Instructions (Signed)
You were seen in the emergency department for swelling of your legs and weeping.  Your heart failure number was elevated and will be important for you to take your fluid pill daily for the next few days.  Please follow-up with your primary care doctor and your cardiologist.  Return to the emergency department if any worsening or concerning symptoms.

## 2022-03-08 ENCOUNTER — Telehealth: Payer: Self-pay

## 2022-03-08 NOTE — Telephone Encounter (Signed)
She can go back to taking lasix one tab daily for a week or two and if leg swelling is completely gone, can take lasix as needed for swelling

## 2022-03-08 NOTE — Telephone Encounter (Signed)
VM 1110: Patient called and left a VM.  Message is as follows:  "How are you this is Heidi Adams I just talked to Cytogeneticist and she told me to call you and I'm supposed to take 1-'20mg'$  tablet a day but he didn't say for how long or anything so if you give me a call and let me know what's going on I appreciate it. talk to you later bye bye."  I called patient, but she could not hear me, so she hung up" I tried calling her 2 more times, NA, LMAM.  Patient called back, she is asking about the Lasix. She said that her leg is draining a less than before, but she wants to know if you want her to increase the Lasix some more? Per patient, "my legs are leaking so bad, I'm runnig out of stuff to catch it with" Please advise.

## 2022-03-09 DIAGNOSIS — I480 Paroxysmal atrial fibrillation: Secondary | ICD-10-CM | POA: Diagnosis not present

## 2022-03-09 DIAGNOSIS — I872 Venous insufficiency (chronic) (peripheral): Secondary | ICD-10-CM | POA: Diagnosis not present

## 2022-03-09 DIAGNOSIS — I110 Hypertensive heart disease with heart failure: Secondary | ICD-10-CM | POA: Diagnosis not present

## 2022-03-09 DIAGNOSIS — I420 Dilated cardiomyopathy: Secondary | ICD-10-CM | POA: Diagnosis not present

## 2022-03-09 DIAGNOSIS — I5042 Chronic combined systolic (congestive) and diastolic (congestive) heart failure: Secondary | ICD-10-CM | POA: Diagnosis not present

## 2022-03-09 DIAGNOSIS — R600 Localized edema: Secondary | ICD-10-CM | POA: Diagnosis not present

## 2022-03-09 NOTE — Telephone Encounter (Signed)
Spoke with patient and gave her the above message. Patient expressed an understanding without any questions. DSK,CMA

## 2022-03-16 DIAGNOSIS — I420 Dilated cardiomyopathy: Secondary | ICD-10-CM | POA: Diagnosis not present

## 2022-03-16 DIAGNOSIS — R600 Localized edema: Secondary | ICD-10-CM | POA: Diagnosis not present

## 2022-03-16 DIAGNOSIS — I83019 Varicose veins of right lower extremity with ulcer of unspecified site: Secondary | ICD-10-CM | POA: Diagnosis not present

## 2022-03-16 DIAGNOSIS — I872 Venous insufficiency (chronic) (peripheral): Secondary | ICD-10-CM | POA: Diagnosis not present

## 2022-03-16 DIAGNOSIS — L97919 Non-pressure chronic ulcer of unspecified part of right lower leg with unspecified severity: Secondary | ICD-10-CM | POA: Diagnosis not present

## 2022-03-16 DIAGNOSIS — I480 Paroxysmal atrial fibrillation: Secondary | ICD-10-CM | POA: Diagnosis not present

## 2022-03-16 DIAGNOSIS — I5042 Chronic combined systolic (congestive) and diastolic (congestive) heart failure: Secondary | ICD-10-CM | POA: Diagnosis not present

## 2022-03-16 DIAGNOSIS — I110 Hypertensive heart disease with heart failure: Secondary | ICD-10-CM | POA: Diagnosis not present

## 2022-03-27 ENCOUNTER — Inpatient Hospital Stay (HOSPITAL_COMMUNITY)
Admission: EM | Admit: 2022-03-27 | Discharge: 2022-03-29 | DRG: 291 | Disposition: A | Discharge status: Home Health | Payer: PPO | Attending: Internal Medicine | Admitting: Internal Medicine

## 2022-03-27 ENCOUNTER — Emergency Department (HOSPITAL_COMMUNITY): Payer: PPO

## 2022-03-27 ENCOUNTER — Encounter (HOSPITAL_COMMUNITY): Payer: Self-pay | Admitting: Emergency Medicine

## 2022-03-27 ENCOUNTER — Other Ambulatory Visit: Payer: Self-pay

## 2022-03-27 DIAGNOSIS — I428 Other cardiomyopathies: Secondary | ICD-10-CM | POA: Diagnosis present

## 2022-03-27 DIAGNOSIS — Z87891 Personal history of nicotine dependence: Secondary | ICD-10-CM

## 2022-03-27 DIAGNOSIS — L03115 Cellulitis of right lower limb: Secondary | ICD-10-CM | POA: Diagnosis present

## 2022-03-27 DIAGNOSIS — E890 Postprocedural hypothyroidism: Secondary | ICD-10-CM | POA: Diagnosis present

## 2022-03-27 DIAGNOSIS — Z888 Allergy status to other drugs, medicaments and biological substances status: Secondary | ICD-10-CM | POA: Diagnosis not present

## 2022-03-27 DIAGNOSIS — N1831 Chronic kidney disease, stage 3a: Secondary | ICD-10-CM | POA: Diagnosis present

## 2022-03-27 DIAGNOSIS — I951 Orthostatic hypotension: Secondary | ICD-10-CM | POA: Diagnosis not present

## 2022-03-27 DIAGNOSIS — I452 Bifascicular block: Secondary | ICD-10-CM | POA: Diagnosis not present

## 2022-03-27 DIAGNOSIS — I509 Heart failure, unspecified: Secondary | ICD-10-CM | POA: Diagnosis not present

## 2022-03-27 DIAGNOSIS — Z043 Encounter for examination and observation following other accident: Secondary | ICD-10-CM | POA: Diagnosis not present

## 2022-03-27 DIAGNOSIS — Z743 Need for continuous supervision: Secondary | ICD-10-CM | POA: Diagnosis not present

## 2022-03-27 DIAGNOSIS — I429 Cardiomyopathy, unspecified: Secondary | ICD-10-CM | POA: Diagnosis not present

## 2022-03-27 DIAGNOSIS — M25522 Pain in left elbow: Secondary | ICD-10-CM | POA: Diagnosis not present

## 2022-03-27 DIAGNOSIS — I5043 Acute on chronic combined systolic (congestive) and diastolic (congestive) heart failure: Secondary | ICD-10-CM | POA: Diagnosis not present

## 2022-03-27 DIAGNOSIS — Z66 Do not resuscitate: Secondary | ICD-10-CM | POA: Diagnosis present

## 2022-03-27 DIAGNOSIS — Z8249 Family history of ischemic heart disease and other diseases of the circulatory system: Secondary | ICD-10-CM | POA: Diagnosis not present

## 2022-03-27 DIAGNOSIS — R600 Localized edema: Secondary | ICD-10-CM | POA: Diagnosis not present

## 2022-03-27 DIAGNOSIS — I480 Paroxysmal atrial fibrillation: Secondary | ICD-10-CM | POA: Diagnosis not present

## 2022-03-27 DIAGNOSIS — Z8673 Personal history of transient ischemic attack (TIA), and cerebral infarction without residual deficits: Secondary | ICD-10-CM | POA: Diagnosis not present

## 2022-03-27 DIAGNOSIS — Z96643 Presence of artificial hip joint, bilateral: Secondary | ICD-10-CM | POA: Diagnosis present

## 2022-03-27 DIAGNOSIS — E039 Hypothyroidism, unspecified: Secondary | ICD-10-CM | POA: Diagnosis not present

## 2022-03-27 DIAGNOSIS — Z885 Allergy status to narcotic agent status: Secondary | ICD-10-CM | POA: Diagnosis not present

## 2022-03-27 DIAGNOSIS — R531 Weakness: Secondary | ICD-10-CM | POA: Diagnosis not present

## 2022-03-27 DIAGNOSIS — M1990 Unspecified osteoarthritis, unspecified site: Secondary | ICD-10-CM | POA: Diagnosis not present

## 2022-03-27 DIAGNOSIS — L0390 Cellulitis, unspecified: Secondary | ICD-10-CM

## 2022-03-27 DIAGNOSIS — Z7901 Long term (current) use of anticoagulants: Secondary | ICD-10-CM | POA: Diagnosis not present

## 2022-03-27 DIAGNOSIS — R5381 Other malaise: Secondary | ICD-10-CM | POA: Diagnosis not present

## 2022-03-27 DIAGNOSIS — M7989 Other specified soft tissue disorders: Secondary | ICD-10-CM | POA: Diagnosis not present

## 2022-03-27 DIAGNOSIS — Z85828 Personal history of other malignant neoplasm of skin: Secondary | ICD-10-CM

## 2022-03-27 DIAGNOSIS — J811 Chronic pulmonary edema: Secondary | ICD-10-CM | POA: Diagnosis not present

## 2022-03-27 DIAGNOSIS — Z79899 Other long term (current) drug therapy: Secondary | ICD-10-CM | POA: Diagnosis not present

## 2022-03-27 DIAGNOSIS — R6889 Other general symptoms and signs: Secondary | ICD-10-CM | POA: Diagnosis not present

## 2022-03-27 DIAGNOSIS — Z803 Family history of malignant neoplasm of breast: Secondary | ICD-10-CM

## 2022-03-27 DIAGNOSIS — Z823 Family history of stroke: Secondary | ICD-10-CM

## 2022-03-27 DIAGNOSIS — R509 Fever, unspecified: Secondary | ICD-10-CM | POA: Diagnosis not present

## 2022-03-27 DIAGNOSIS — I4891 Unspecified atrial fibrillation: Secondary | ICD-10-CM | POA: Diagnosis present

## 2022-03-27 DIAGNOSIS — Z853 Personal history of malignant neoplasm of breast: Secondary | ICD-10-CM

## 2022-03-27 DIAGNOSIS — I130 Hypertensive heart and chronic kidney disease with heart failure and stage 1 through stage 4 chronic kidney disease, or unspecified chronic kidney disease: Principal | ICD-10-CM | POA: Diagnosis present

## 2022-03-27 DIAGNOSIS — R0789 Other chest pain: Secondary | ICD-10-CM | POA: Diagnosis not present

## 2022-03-27 DIAGNOSIS — Z8542 Personal history of malignant neoplasm of other parts of uterus: Secondary | ICD-10-CM | POA: Diagnosis not present

## 2022-03-27 DIAGNOSIS — R0602 Shortness of breath: Secondary | ICD-10-CM | POA: Diagnosis not present

## 2022-03-27 DIAGNOSIS — Z833 Family history of diabetes mellitus: Secondary | ICD-10-CM | POA: Diagnosis not present

## 2022-03-27 DIAGNOSIS — R079 Chest pain, unspecified: Secondary | ICD-10-CM | POA: Diagnosis not present

## 2022-03-27 DIAGNOSIS — I5022 Chronic systolic (congestive) heart failure: Secondary | ICD-10-CM | POA: Diagnosis not present

## 2022-03-27 DIAGNOSIS — I110 Hypertensive heart disease with heart failure: Secondary | ICD-10-CM | POA: Diagnosis not present

## 2022-03-27 DIAGNOSIS — R519 Headache, unspecified: Secondary | ICD-10-CM | POA: Diagnosis not present

## 2022-03-27 DIAGNOSIS — R300 Dysuria: Secondary | ICD-10-CM | POA: Diagnosis not present

## 2022-03-27 DIAGNOSIS — Z79890 Hormone replacement therapy (postmenopausal): Secondary | ICD-10-CM

## 2022-03-27 DIAGNOSIS — R296 Repeated falls: Secondary | ICD-10-CM | POA: Diagnosis present

## 2022-03-27 DIAGNOSIS — Z96653 Presence of artificial knee joint, bilateral: Secondary | ICD-10-CM | POA: Diagnosis not present

## 2022-03-27 DIAGNOSIS — I10 Essential (primary) hypertension: Secondary | ICD-10-CM

## 2022-03-27 LAB — CBC WITH DIFFERENTIAL/PLATELET
Abs Immature Granulocytes: 0.02 10*3/uL (ref 0.00–0.07)
Basophils Absolute: 0 10*3/uL (ref 0.0–0.1)
Basophils Relative: 1 %
Eosinophils Absolute: 0 10*3/uL (ref 0.0–0.5)
Eosinophils Relative: 1 %
HCT: 44.9 % (ref 36.0–46.0)
Hemoglobin: 13.3 g/dL (ref 12.0–15.0)
Immature Granulocytes: 0 %
Lymphocytes Relative: 13 %
Lymphs Abs: 0.8 10*3/uL (ref 0.7–4.0)
MCH: 27.9 pg (ref 26.0–34.0)
MCHC: 29.6 g/dL — ABNORMAL LOW (ref 30.0–36.0)
MCV: 94.1 fL (ref 80.0–100.0)
Monocytes Absolute: 0.3 10*3/uL (ref 0.1–1.0)
Monocytes Relative: 5 %
Neutro Abs: 5.1 10*3/uL (ref 1.7–7.7)
Neutrophils Relative %: 80 %
Platelets: 196 10*3/uL (ref 150–400)
RBC: 4.77 MIL/uL (ref 3.87–5.11)
RDW: 22.2 % — ABNORMAL HIGH (ref 11.5–15.5)
WBC: 6.3 10*3/uL (ref 4.0–10.5)
nRBC: 0 % (ref 0.0–0.2)

## 2022-03-27 LAB — BASIC METABOLIC PANEL
Anion gap: 11 (ref 5–15)
BUN: 21 mg/dL (ref 8–23)
CO2: 26 mmol/L (ref 22–32)
Calcium: 8.7 mg/dL — ABNORMAL LOW (ref 8.9–10.3)
Chloride: 106 mmol/L (ref 98–111)
Creatinine, Ser: 1.06 mg/dL — ABNORMAL HIGH (ref 0.44–1.00)
GFR, Estimated: 52 mL/min — ABNORMAL LOW (ref >60)
Glucose, Bld: 116 mg/dL — ABNORMAL HIGH (ref 70–99)
Potassium: 4.5 mmol/L (ref 3.5–5.1)
Sodium: 143 mmol/L (ref 135–145)

## 2022-03-27 LAB — TROPONIN I (HIGH SENSITIVITY)
Troponin I (High Sensitivity): 15 ng/L (ref <18)
Troponin I (High Sensitivity): 17 ng/L (ref <18)

## 2022-03-27 LAB — LACTIC ACID, PLASMA
Lactic Acid, Venous: 2.2 mmol/L (ref 0.5–1.9)
Lactic Acid, Venous: 1.7 mmol/L (ref 0.5–1.9)

## 2022-03-27 LAB — BRAIN NATRIURETIC PEPTIDE: B Natriuretic Peptide: 2134 pg/mL — ABNORMAL HIGH (ref 0.0–100.0)

## 2022-03-27 MED ORDER — ONDANSETRON HCL 4 MG/2ML IJ SOLN: 4.0000 mg | Freq: Four times a day (QID) | INTRAMUSCULAR | Status: DC | PRN

## 2022-03-27 MED ORDER — AMIODARONE HCL 200 MG PO TABS
100.0000 mg | ORAL_TABLET | Freq: Every day | ORAL | Status: DC
  Administered 2022-03-28 – 2022-03-29 (×2): 100 mg via ORAL
  Filled 2022-03-27 (×2): qty 1

## 2022-03-27 MED ORDER — LOSARTAN POTASSIUM 25 MG PO TABS
25.0000 mg | ORAL_TABLET | Freq: Every day | ORAL | Status: DC
  Administered 2022-03-28 – 2022-03-29 (×2): 25 mg via ORAL
  Filled 2022-03-27 (×2): qty 1

## 2022-03-27 MED ORDER — APIXABAN 2.5 MG PO TABS
2.5000 mg | ORAL_TABLET | Freq: Two times a day (BID) | ORAL | Status: DC
  Administered 2022-03-27 – 2022-03-29 (×4): 2.5 mg via ORAL
  Filled 2022-03-27 (×4): qty 1

## 2022-03-27 MED ORDER — METOPROLOL SUCCINATE ER 25 MG PO TB24
25.0000 mg | ORAL_TABLET | Freq: Every day | ORAL | Status: DC
  Administered 2022-03-28 – 2022-03-29 (×2): 25 mg via ORAL
  Filled 2022-03-27 (×2): qty 1

## 2022-03-27 MED ORDER — LEVOTHYROXINE SODIUM 25 MCG PO TABS
125.0000 ug | ORAL_TABLET | Freq: Every day | ORAL | Status: DC
  Administered 2022-03-28 – 2022-03-29 (×2): 125 ug via ORAL
  Filled 2022-03-27 (×2): qty 1

## 2022-03-27 MED ORDER — ACETAMINOPHEN 325 MG PO TABS
650.0000 mg | ORAL_TABLET | ORAL | Status: DC | PRN
  Administered 2022-03-27 – 2022-03-29 (×5): 650 mg via ORAL
  Filled 2022-03-27 (×5): qty 2

## 2022-03-27 MED ORDER — FUROSEMIDE 10 MG/ML IJ SOLN
40.0000 mg | Freq: Once | INTRAMUSCULAR | Status: AC
  Administered 2022-03-27: 40 mg via INTRAVENOUS
  Filled 2022-03-27: qty 4

## 2022-03-27 MED ORDER — FUROSEMIDE 10 MG/ML IJ SOLN
40.0000 mg | Freq: Every day | INTRAMUSCULAR | Status: DC
  Administered 2022-03-28: 40 mg via INTRAVENOUS
  Filled 2022-03-27: qty 4

## 2022-03-27 MED ORDER — ALBUTEROL SULFATE HFA 108 (90 BASE) MCG/ACT IN AERS
2.0000 | INHALATION_SPRAY | RESPIRATORY_TRACT | Status: DC | PRN
  Administered 2022-03-27: 2 via RESPIRATORY_TRACT
  Filled 2022-03-27: qty 6.7

## 2022-03-27 MED ORDER — ALBUTEROL SULFATE (2.5 MG/3ML) 0.083% IN NEBU
2.5000 mg | INHALATION_SOLUTION | RESPIRATORY_TRACT | Status: DC | PRN
  Administered 2022-03-27: 2.5 mg via RESPIRATORY_TRACT
  Filled 2022-03-27: qty 3

## 2022-03-27 MED ORDER — SODIUM CHLORIDE 0.9 % IV SOLN
1.0000 g | INTRAVENOUS | Status: DC
  Administered 2022-03-27 – 2022-03-28 (×2): 1 g via INTRAVENOUS
  Filled 2022-03-27 (×2): qty 10

## 2022-03-27 NOTE — Assessment & Plan Note (Addendum)
-   Continue losartan and Toprol -Continue amiodarone

## 2022-03-27 NOTE — Progress Notes (Signed)
Pt arrived to unit. Skin assessment done with charge nurse. Vitals stable. Notified provider.

## 2022-03-27 NOTE — Assessment & Plan Note (Addendum)
-   presents with pulmonary crackles, cardiomegaly, B/L LE edema, DOE/SOB - BNP 2,134 -After further information obtained, she was not quite taking Lasix truly every day at home.  She is good about weighing herself consistently she says - last echo reviewed from June 2023: EF < 20%, Grade III DD, moderate LVH, hypokinetic global wall motion, moderate MR, mild TR - follows with Dr. [name] -Diuresed well during hospitalization with significant improvement in her lower extremity edema and resolution of her shortness of breath -Discharged on 2 days of Lasix 40 mg twice daily then followed by resuming home dose of Lasix 40 mg daily; this was indicated in her discharge -Outpatient follow-up with cardiology -Patient instructed to continue weighing herself daily

## 2022-03-27 NOTE — ED Provider Notes (Signed)
[facility] DEPT Provider Note   CSN: [id number] Arrival date & time: 03/27/22  1259     History  Chief Complaint  Patient presents with   Shortness of Breath   Chest Pain    Heidi Adams is a 86 y.o. female with a past medical history of A-fib and systolic heart failure presenting today with acute onset shortness of breath.  She reports that she was fine yesterday but this morning she woke up short of breath.  Went to church but shortness of breath did not improve so she decided to come here.  No history of DVT/PE, recent travel or surgery.  Does have a history of paroxysmal A-fib and she is unsure when she goes into this rhythm.  Does report that she had some left-sided chest pain yesterday that has continued into today.  Does not radiate, feels dull.  No back pain.   Also reports that she fell down a week ago and sustained an abrasion to her right leg.  She has had this seen outpatient and was noted to not be infected.  No fevers or chills.   Shortness of Breath Associated symptoms: chest pain   Chest Pain Associated symptoms: shortness of breath        Home Medications Prior to Admission medications   Medication Sig Start Date End Date Taking? Authorizing Provider  acetaminophen (TYLENOL) 500 MG tablet Take 1,000 mg by mouth 3 (three) times daily as needed for moderate pain.     [provider]  amiodarone (PACERONE) 200 MG tablet Take 0.5 tablets (100 mg total) by mouth daily. 09/06/21   Adrian Prows, MD  ELIQUIS 2.5 MG TABS tablet TAKE 1 TABLET BY MOUTH 2 TIMES A DAY Patient taking differently: Take 2.5 mg by mouth 2 (two) times daily. 01/26/22   Adrian Prows, MD  furosemide (LASIX) 20 MG tablet Take 1 tablet (20 mg total) by mouth daily. Take 20 mg daily with additional doses if weight up by 3 Lbs or shortness of breath or swelling. 12/14/21   Cantwell, Celeste C, PA-C  ipratropium-albuterol (DUONEB) 0.5-2.5 (3) MG/3ML SOLN Take 3 mLs by  nebulization every 6 (six) hours as needed (shortness of breath or wheezing). 07/15/21   Arrien, Jimmy Picket, MD  levothyroxine (SYNTHROID) 125 MCG tablet Take 125 mcg by mouth daily. 10/27/21   [provider]  losartan (COZAAR) 50 MG tablet TAKE 1 TABLET BY MOUTH EACH NIGHT AT BEDTIME Patient taking differently: Take 25 mg by mouth daily. 02/08/22   Adrian Prows, MD  metoprolol succinate (TOPROL-XL) 25 MG 24 hr tablet TAKE 1 TABLET BY MOUTH EVERY DAY Patient taking differently: Take 25 mg by mouth daily. 01/31/22   Adrian Prows, MD  pantoprazole (PROTONIX) 40 MG tablet Take 40 mg by mouth daily as needed (indigestion/heartburn.).  01/20/16   [provider]  potassium chloride (KLOR-CON) 10 MEQ tablet Take 2 tablets (20 mEq total) by mouth every other day. Take every time with furosemide Patient taking differently: Take 20 mEq by mouth every 3 (three) days. Taking every three days with furosemide 02/24/21   Adrian Prows, MD  potassium chloride (KLOR-CON) 10 MEQ tablet TAKE 1 TABLET BY MOUTH 2 TIMES A DAY Patient not taking: Reported on 03/06/2022 12/16/21   Adrian Prows, MD      Allergies    Codeine, Epinephrine, Hydrocodone-acetaminophen, and Tenormin [atenolol]    Review of Systems   Review of Systems  Respiratory:  Positive for shortness of breath.  Cardiovascular:  Positive for chest pain.    Physical Exam Updated Vital Signs BP (!) 176/100 (BP Location: Left Arm)   Pulse 76   Temp 98.1 F (36.7 C) (Oral)   Resp 16   LMP 07/25/1988 Comment: full   SpO2 99%  Physical Exam Vitals and nursing note reviewed.  Constitutional:      General: She is not in acute distress.    Appearance: Normal appearance. She is not ill-appearing.  HENT:     Head: Normocephalic and atraumatic.  Eyes:     General: No scleral icterus.    Conjunctiva/sclera: Conjunctivae normal.  Cardiovascular:     Rate and Rhythm: Normal rate and regular rhythm.  Pulmonary:     Effort: Pulmonary effort  is normal. No respiratory distress.     Breath sounds: Examination of the right-upper field reveals decreased breath sounds. Examination of the left-upper field reveals decreased breath sounds. Examination of the right-middle field reveals decreased breath sounds. Examination of the left-middle field reveals decreased breath sounds. Examination of the right-lower field reveals decreased breath sounds. Examination of the left-lower field reveals decreased breath sounds. Decreased breath sounds present.  Chest:     Chest wall: No mass.  Skin:    General: Skin is warm and dry.     Findings: Erythema present. No rash.  Neurological:     Mental Status: She is alert.  Psychiatric:        Mood and Affect: Mood normal.        ED Results / Procedures / Treatments   Labs (all labs ordered are listed, but only abnormal results are displayed) Labs Reviewed  CBC WITH DIFFERENTIAL/PLATELET - Abnormal; Notable for the following components:      Result Value   MCHC 29.6 (*)    RDW 22.2 (*)    All other components within normal limits  LACTIC ACID, PLASMA - Abnormal; Notable for the following components:   Lactic Acid, Venous 2.2 (*)    All other components within normal limits  BASIC METABOLIC PANEL - Abnormal; Notable for the following components:   Glucose, Bld 116 (*)    Creatinine, Ser 1.06 (*)    Calcium 8.7 (*)    GFR, Estimated 52 (*)    All other components within normal limits  BRAIN NATRIURETIC PEPTIDE - Abnormal; Notable for the following components:   B Natriuretic Peptide 2,134.0 (*)    All other components within normal limits  CULTURE, BLOOD (ROUTINE X 2)  CULTURE, BLOOD (ROUTINE X 2)  LACTIC ACID, PLASMA  TROPONIN I (HIGH SENSITIVITY)  TROPONIN I (HIGH SENSITIVITY)    EKG EKG Interpretation  Date/Time:  Sunday March 27 2022 13:05:20 EDT Ventricular Rate:  76 PR Interval:  181 QRS Duration: 106 QT Interval:  410 QTC Calculation: 461 R Axis:   -77 Text  Interpretation: Sinus rhythm Atrial premature complex Incomplete RBBB and LAFB Anteroseptal infarct, old Nonspecific T abnormalities, lateral leads Baseline wander in lead(s) V6 Confirmed by Lacretia Leigh ([id number]) on 03/27/2022 3:32:49 PM  Radiology DG Chest 2 View  Result Date: 03/27/2022 CLINICAL DATA:  Chest pain and shortness of breath. EXAM: CHEST - 2 VIEW COMPARISON:  03/06/2022 FINDINGS: The cardio pericardial silhouette is enlarged. There is pulmonary vascular congestion without overt pulmonary edema. Prominent skin fold noted over the lateral left lung. The visualized bony structures of the thorax are unremarkable. Telemetry leads overlie the chest. IMPRESSION: Enlarged cardiopericardial silhouette with pulmonary vascular congestion. Electronically Signed   By: Misty Stanley  M.D.   On: 03/27/2022 13:38    Procedures Procedures   Medications Ordered in ED Medications  albuterol (VENTOLIN HFA) 108 (90 Base) MCG/ACT inhaler 2 puff (2 puffs Inhalation Given 03/27/22 1502)  furosemide (LASIX) injection 40 mg (40 mg Intravenous Given 03/27/22 1453)    ED Course/ Medical Decision Making/ A&P Clinical Course as of 03/27/22 1533  Sun Mar 27, 2022  1530 Lactic Acid, Venous(!!): 2.2 [MR]    Clinical Course User Index [MR] Yitzchak Kothari, Cecilio Asper, PA-C                           Medical Decision Making Amount and/or Complexity of Data Reviewed Labs: ordered. Decision-making details documented in ED Course. Radiology: ordered.  Risk Prescription drug management. Decision regarding hospitalization.   This is a 86 year old female who presents to the ED for concern of shortness of breath and left-sided chest pain. The emergent differential diagnosis of chest pain includes: Acute coronary syndrome, pericarditis, aortic dissection, pulmonary embolism, tension pneumothorax, and esophageal rupture.   This is not an exhaustive differential.    Past Medical History / Co-morbidities / Social  History: Paroxysmal A-fib on Eliquis, congestive heart failure on furosemide at home   Additional history: Patient presented 3 weeks ago with similarly.  Did not require admission to the hospital at that time.  Follows closely with cardiology for A-fib and congestive heart failure.   Physical Exam: Pertinent physical exam findings include Distant heart sounds Wound infection of right lower shin  Lab Tests: I ordered, and personally interpreted labs.  The pertinent results include: BNP 2134 Lactic 2.2, wound versus CHF exacerbation.   Imaging Studies: I ordered and independently visualized and interpreted chest x-ray and I agree with the radiologist that there is baseline cardiomegaly and new pulmonary vascular congestion   Cardiac Monitoring:  The patient was maintained on a cardiac monitor.  My attending physician Dr. Zenia Resides viewed and interpreted the cardiac monitored which showed an underlying rhythm of: NSR   Medications: I ordered medication including lasix for diuresis.Marland Kitchen Reevaluation of the patient after these medicines showed that the patient improved.    MDM/Disposition: This is a 86 year old female who presented today with shortness of breath.  Found to be having a CHF exacerbation.  Also had a wound that appeared to be infected.  Imaging was negative for osteo and patient is afebrile, nontachycardic and without a white count.  No signs of systemic illness, will not obtain further imaging or start on IV antibiotics at this time.  At this time patient meets admission criteria. Patient and her husband are agreeable.  Husband is going home because he cannot drive at nighttime and would like notification when patient is in a hospital room.    I discussed this case with my attending physician Dr. Zenia Resides. Who cosigned this note including patient's presenting symptoms, physical exam, and planned diagnostics and interventions. Attending physician stated agreement with plan or made  changes to plan which were implemented.      Final Clinical Impression(s) / ED Diagnoses Final diagnoses:  Acute on chronic congestive heart failure, unspecified heart failure type ([place])  Cellulitis of right lower extremity    Rx / DC Orders Admit to [facility].   Darliss Ridgel 03/27/22 1658    Lacretia Leigh, MD 03/28/22 [phone]

## 2022-03-27 NOTE — H&P (Signed)
History and Physical    Heidi Adams  POE:[id number]  DOB: [birth year]  DOA: 03/27/2022  PCP: [name], MD Patient coming from: Home  Chief Complaint: SOB, LE swelling, RLE wound  HPI:  Heidi Adams is an 86 yo female with PMH chronic systolic diastolic CHF (RX<54%), HTN, hypothyroidism, left breast CA s/p lumpectomy 2018, uterine CA s/p hysterectomy 1991 who presented to the ER with worsening shortness of breath. She has chronic lower extremity edema which she states is no worse than usual.  Her biggest complaint was her worsening dyspnea. She also states that she has hit her right lower leg against a dresser at home when it got stuck and she has a laceration that she has been keeping covered with a dry bandage at home.  Injury occurred approximately 2 weeks ago and despite covering with dry bandage, she states that the wound has continued to worsen and started to have some mild drainage. She has not been on any prior antibiotics.  She denies any fevers or chills. X-ray of right leg obtained in the ER shows diffuse soft tissue swelling in the subcutaneous plane. CXR also shows pulmonary vascular congestion.  BNP elevated 2,134.  WBC normal at 6.3 and normal PLTC 196k.   She was given a dose of Lasix in the ER with immediate response in voiding.  Concern was for CHF exacerbation as well as possible worsening of right leg injury leading to some mild cellulitis.  She will be started on antibiotics and diuresis and admitted for further monitoring.  I have personally briefly reviewed patient's [facility] and discussed patient with the ER provider when appropriate/indicated.  Assessment and Plan: * Acute on chronic combined systolic and diastolic CHF (congestive heart failure) (HCC) - presents with pulmonary crackles, cardiomegaly, B/L LE edema, DOE/SOB - BNP 2,134 - compliant with lasix - start on lasix 40 mg IV daily - last echo reviewed from June 2023: EF <  20%, Grade III DD, moderate LVH, hypokinetic global wall motion, moderate MR, mild TR - follows with Dr. [name], will inform of admission   Cellulitis - s/p injury to RLE on lower leg anterior surface; does have some mild purulent appearing drainage but likely confounded some by chronic severe LE edema in setting of CHF - no fever or leukocytosis; mostly erythema and TTP - start rocephin and monitor response - s/p xray; hold off on further imaging at this time  - follow up cultures - wound care RN consulted for assistance as well   HTN (hypertension) - Continue losartan and Toprol -Continue amiodarone  A-fib ([place]) - continue Eliquis and Toprol   Hypothyroidism - Continue Synthroid - recheck TSH; previously elevated in Jan 2023        Code Status:    Code Status: DNR  DVT Prophylaxis: Eliquis     Anticipated disposition is to: Home; pending PT/OT evals too  History: Past Medical History:  Diagnosis Date   Anemia    hx of anemia   Arthritis    Breast cancer ([place])    Left breast   Chronic combined systolic and diastolic CHF (congestive heart failure) ([place]) 09/22/2018   History of skin cancer    Hypertension    Hypothyroidism    PVC (premature ventricular contraction)     Past Surgical History:  Procedure Laterality Date   ABDOMINAL HYSTERECTOMY  90   TAH BSO   APPENDECTOMY     BREAST LUMPECTOMY Left 05/30/2017   BREAST  LUMPECTOMY WITH RADIOACTIVE SEED LOCALIZATION Left 05/30/2017   Procedure: LEFT BREAST LUMPECTOMY WITH RADIOACTIVE SEED LOCALIZATION;  Surgeon: Excell Seltzer, MD;  Location: [facility];  Service: General;  Laterality: Left;   CATARACT EXTRACTION  09/10/2018   Right eye   CHOLECYSTECTOMY     dr Georgia Lopes   ELBOW SURGERY     EYE SURGERY     "on my eyelids" years ago   KNEE ARTHROSCOPY     left   THYROIDECTOMY  1973   TONSILLECTOMY     TOTAL HIP ARTHROPLASTY Right 05/10/2016   Procedure: TOTAL HIP ARTHROPLASTY ANTERIOR  APPROACH;  Surgeon: Paralee Cancel, MD;  Location: [facility];  Service: Orthopedics;  Laterality: Right;   TOTAL HIP ARTHROPLASTY Left 01/10/2017   Procedure: LEFT TOTAL HIP ARTHROPLASTY ANTERIOR APPROACH;  Surgeon: Paralee Cancel, MD;  Location: [facility];  Service: Orthopedics;  Laterality: Left;   TOTAL KNEE ARTHROPLASTY Left 09/28/2015   Procedure: LEFT TOTAL KNEE ARTHROPLASTY;  Surgeon: Paralee Cancel, MD;  Location[facility];  Service: Orthopedics;  Laterality: Left;   TOTAL KNEE ARTHROPLASTY Right 08/09/2016   Procedure: RIGHT TOTAL KNEE ARTHROPLASTY;  Surgeon: Paralee Cancel, MD;  Location: [facility];  Service: Orthopedics;  Laterality: Right;  Adductor Block   Total Left hip arthroplasty     01/10/17 Dr. Alvan Dame     reports that she quit smoking about 34 years ago. Her smoking use included cigarettes. She has a 20.00 pack-year smoking history. She has never used smokeless tobacco. She reports that she does not drink alcohol and does not use drugs.  Allergies  Allergen Reactions   Codeine Nausea Only   Epinephrine     Tachcardyia, chest pains tremors   Hydrocodone-Acetaminophen Other (See Comments)    Other reaction(s): sick on stomach per patient   Tenormin [Atenolol] Rash    Family History  Problem Relation Age of Onset   Diabetes Father    Hypertension Father    Stroke Father    Pneumonia Sister    Cancer Brother    Other Brother        Brain tumor   Breast cancer Cousin    Home Medications: Prior to Admission medications   Medication Sig Start Date End Date Taking? Authorizing Provider  acetaminophen (TYLENOL) 500 MG tablet Take 1,000 mg by mouth 3 (three) times daily as needed for moderate pain.    Yes [provider]  amiodarone (PACERONE) 200 MG tablet Take 0.5 tablets (100 mg total) by mouth daily. 09/06/21  Yes Adrian Prows, MD  ELIQUIS 2.5 MG TABS tablet TAKE 1 TABLET BY MOUTH 2 TIMES A DAY Patient taking differently: Take 2.5 mg by mouth 2 (two) times daily. 01/26/22  Yes Adrian Prows, MD  furosemide (LASIX) 20 MG tablet Take 1 tablet (20 mg total) by mouth daily. Take 20 mg daily with additional doses if weight up by 3 Lbs or shortness of breath or swelling. 12/14/21  Yes Cantwell, Celeste C, PA-C  ipratropium-albuterol (DUONEB) 0.5-2.5 (3) MG/3ML SOLN Take 3 mLs by nebulization every 6 (six) hours as needed (shortness of breath or wheezing). 07/15/21  Yes Arrien, Jimmy Picket, MD  levothyroxine (SYNTHROID) 125 MCG tablet Take 125 mcg by mouth daily. 10/27/21  Yes [provider]  losartan (COZAAR) 50 MG tablet TAKE 1 TABLET BY MOUTH EACH NIGHT AT BEDTIME Patient taking differently: Take 25 mg by mouth daily. 02/08/22  Yes Adrian Prows, MD  metoprolol succinate (TOPROL-XL) 25 MG 24 hr tablet TAKE 1 TABLET  BY MOUTH EVERY DAY Patient taking differently: Take 25 mg by mouth daily. 01/31/22  Yes Adrian Prows, MD  pantoprazole (PROTONIX) 40 MG tablet Take 40 mg by mouth daily as needed (indigestion/heartburn.).  01/20/16  Yes [provider]  potassium chloride (KLOR-CON) 10 MEQ tablet Take 2 tablets (20 mEq total) by mouth every other day. Take every time with furosemide Patient taking differently: Take 20 mEq by mouth every 3 (three) days. Taking every three days with furosemide 02/24/21  Yes Adrian Prows, MD    Review of Systems:  Review of Systems  Constitutional: Negative.   HENT: Negative.    Eyes: Negative.   Respiratory:  Positive for shortness of breath. Negative for cough, sputum production and wheezing.   Cardiovascular:  Positive for leg swelling. Negative for chest pain and palpitations.  Gastrointestinal: Negative.   Genitourinary: Negative.   Musculoskeletal: Negative.   Skin: Negative.   Neurological: Negative.   Endo/Heme/Allergies: Negative.   Psychiatric/Behavioral: Negative.      Physical Exam:  Vitals:   03/27/22 1530 03/27/22 1630 03/27/22 1659 03/27/22 1736  BP: (!) 167/96 (!) 173/104    Pulse: 78 82    Resp: 19 (!) 34    Temp:    98.3 F (36.8 C)   TempSrc:   Oral   SpO2: 95% 96%    Weight:    56.7 kg  Height:    '5\' 4"'$  (1.626 m)   Physical Exam Constitutional:      General: She is not in acute distress.    Comments: Uncomfortable appearing  HENT:     Head: Normocephalic and atraumatic.     Mouth/Throat:     Mouth: Mucous membranes are moist.  Eyes:     Extraocular Movements: Extraocular movements intact.     Pupils: Pupils are equal, round, and reactive to light.  Cardiovascular:     Rate and Rhythm: Normal rate and regular rhythm.  Pulmonary:     Comments: Scattered crackles throughout.  Very minor expiratory wheezing Abdominal:     General: Bowel sounds are normal. There is no distension.     Palpations: Abdomen is soft.     Tenderness: There is no abdominal tenderness.  Musculoskeletal:     Cervical back: Normal range of motion and neck supple.     Comments: Chronic 2-3+ bilateral lower extremity pitting edema up to knees.  RLE noted with excoriated skin from superficial skin tear now with surrounding erythema and tenderness.  Mild fluid drainage appears to be a mix of serous and purulent drainage and mildly foul smelling  Skin:    General: Skin is warm and dry.  Neurological:     General: No focal deficit present.     Mental Status: She is alert.  Psychiatric:        Mood and Affect: Mood normal.      Labs on Admission:  I have personally reviewed following labs and imaging studies Results for orders placed or performed during the hospital encounter of 03/27/22 (from the past 24 hour(s))  Lactic acid, plasma     Status: Abnormal   Collection Time: 03/27/22  2:38 PM  Result Value Ref Range   Lactic Acid, Venous 2.2 (HH) 0.5 - 1.9 mmol/L  CBC with Differential     Status: Abnormal   Collection Time: 03/27/22  2:39 PM  Result Value Ref Range   WBC 6.3 4.0 - 10.5 K/uL   RBC 4.77 3.87 - 5.11 MIL/uL   Hemoglobin 13.3 12.0 -  15.0 g/dL   HCT 44.9 36.0 - 46.0 %   MCV 94.1 80.0 - 100.0 fL   MCH  27.9 26.0 - 34.0 pg   MCHC 29.6 (L) 30.0 - 36.0 g/dL   RDW 22.2 (H) 11.5 - 15.5 %   Platelets 196 150 - 400 K/uL   nRBC 0.0 0.0 - 0.2 %   Neutrophils Relative % 80 %   Neutro Abs 5.1 1.7 - 7.7 K/uL   Lymphocytes Relative 13 %   Lymphs Abs 0.8 0.7 - 4.0 K/uL   Monocytes Relative 5 %   Monocytes Absolute 0.3 0.1 - 1.0 K/uL   Eosinophils Relative 1 %   Eosinophils Absolute 0.0 0.0 - 0.5 K/uL   Basophils Relative 1 %   Basophils Absolute 0.0 0.0 - 0.1 K/uL   Immature Granulocytes 0 %   Abs Immature Granulocytes 0.02 0.00 - 0.07 K/uL  Basic metabolic panel     Status: Abnormal   Collection Time: 03/27/22  2:39 PM  Result Value Ref Range   Sodium 143 135 - 145 mmol/L   Potassium 4.5 3.5 - 5.1 mmol/L   Chloride 106 98 - 111 mmol/L   CO2 26 22 - 32 mmol/L   Glucose, Bld 116 (H) 70 - 99 mg/dL   BUN 21 8 - 23 mg/dL   Creatinine, Ser 1.06 (H) 0.44 - 1.00 mg/dL   Calcium 8.7 (L) 8.9 - 10.3 mg/dL   GFR, Estimated 52 (L) >60 mL/min   Anion gap 11 5 - 15  Troponin I (High Sensitivity)     Status: None   Collection Time: 03/27/22  2:39 PM  Result Value Ref Range   Troponin I (High Sensitivity) 15 <18 ng/L  Brain natriuretic peptide     Status: Abnormal   Collection Time: 03/27/22  2:40 PM  Result Value Ref Range   B Natriuretic Peptide 2,134.0 (H) 0.0 - 100.0 pg/mL     Radiological Exams on Admission: DG Tibia/Fibula Right  Result Date: 03/27/2022 CLINICAL DATA:  Swelling, open wounds and skin EXAM: RIGHT TIBIA AND FIBULA - 2 VIEW COMPARISON:  Ankle radiographs done on 08/26/2019 FINDINGS: There is diffuse soft tissue swelling in subcutaneous plane throughout right lower leg extending to the ankle. There is previous right knee arthroplasty. No recent fracture or dislocation is seen. There are no focal lytic lesions. Dense calcification is seen in the plantar fascia. Small plantar spur is seen in calcaneus. Degenerative changes are noted in the intertarsal joints. IMPRESSION: No recent  fracture or dislocation is seen. There are no focal lytic lesions. If there is clinical suspicion for osteomyelitis, follow-up MRI may be considered. Other findings as described in the body of the report. Electronically Signed   By: Elmer Picker M.D.   On: 03/27/2022 15:48   DG Chest 2 View  Result Date: 03/27/2022 CLINICAL DATA:  Chest pain and shortness of breath. EXAM: CHEST - 2 VIEW COMPARISON:  03/06/2022 FINDINGS: The cardio pericardial silhouette is enlarged. There is pulmonary vascular congestion without overt pulmonary edema. Prominent skin fold noted over the lateral left lung. The visualized bony structures of the thorax are unremarkable. Telemetry leads overlie the chest. IMPRESSION: Enlarged cardiopericardial silhouette with pulmonary vascular congestion. Electronically Signed   By: Misty Stanley M.D.   On: 03/27/2022 13:38   DG Tibia/Fibula Right  Final Result    DG Chest 2 View  Final Result      Consults called:  Cardiology   EKG: Independently reviewed. NSR  Dwyane Dee, MD [facility] 03/27/2022, 5:44 PM

## 2022-03-27 NOTE — Consult Note (Signed)
[facility] Nurse Consult Note: Reason for Consult:Right LE full thickness, pretibial wound. Photodocumentation provided by Provider is appreciated. Wound type:trauma, infectious Pressure Injury POA: N/A Measurement:To be obtained by Bedside RN with next dressing application. Wound bed:red, moist Drainage (amount, consistency, odor) moderate serous to serosanguinous Periwound:friable, erythematous, edematous Dressing procedure/placement/frequency: I have provided Nursing with guidance for the topical care of this full thickness wound, using an antimicrobial nonadherent (xeroform) and topped with dry gauze secured with a few turns of Kerlix roll gauze/paper tape once daily. A Prevalon pressure redistribution heel boot is provided for protection, prevention of PI, and elevation. A sacral prophylactic foam dressing is to be applied for sacral PI prevention. Turning and repositioning is in place.  [facility] nursing team will not follow, but will remain available to this patient, the nursing and medical teams.  Please re-consult if needed.  Thank you for inviting [place] to participate in this patient's Plan of Care.  Maudie Flakes, MSN, RN, CNS, [place], Serita Grammes, [employer], [employer] phone:  [phone]

## 2022-03-27 NOTE — ED Triage Notes (Signed)
Pt reports SHOB & chest pain since this morning.

## 2022-03-27 NOTE — Assessment & Plan Note (Signed)
-   Continue Synthroid - recheck TSH; previously elevated in Jan 2023

## 2022-03-27 NOTE — Assessment & Plan Note (Signed)
-   continue Eliquis and Toprol

## 2022-03-27 NOTE — ED Provider Notes (Signed)
I provided a substantive portion of the care of this patient.  I personally performed the entirety of the medical decision making for this encounter.  EKG Interpretation  Date/Time:  Sunday March 27 2022 16:25:38 EDT Ventricular Rate:  85 PR Interval:  185 QRS Duration: 105 QT Interval:  389 QTC Calculation: 463 R Axis:   -64 Text Interpretation: Sinus rhythm Incomplete RBBB and LAFB Anteroseptal infarct, age indeterminate Confirmed by Lacretia Leigh [phone]) on 03/27/2022 4:29:38 PM    This is a 86 year old female presents with shortness of breath and chest pain that began this morning.  Chest x-ray consistent with heart failure.  EKG per my interpretation shows sinus rhythm without coronary ischemia.  Patient given Lasix and will require hospitalization   Lacretia Leigh, MD 03/27/22 1630

## 2022-03-27 NOTE — Assessment & Plan Note (Addendum)
-   s/p injury to RLE on lower leg anterior surface; does have some mild purulent appearing drainage but likely confounded some by chronic severe LE edema in setting of CHF - no fever or leukocytosis; mostly erythema and TTP - start rocephin and monitor response; transitioned to Keflex to complete course at discharge - s/p xray; hold off on further imaging at this time  -Blood cultures remained negative at time of discharge - wound care RN consulted, follow rec's:   "Wound care to right pretibial full thickness wound:  Cleanse with NS, pat gently dry. Cover with size  appropriate piece of Xeroform gauze, top with dry gauze  4x4, secure with a few turns of Kerlix roll gauze, paper tape.  Place foot into Prevalon boot." -Home health RN ordered at discharge

## 2022-03-27 NOTE — ED Notes (Signed)
Pt given dinner tray.

## 2022-03-27 NOTE — Hospital Course (Signed)
Heidi Adams is an 86 yo female with PMH chronic systolic diastolic CHF (JJ<00%), HTN, hypothyroidism, left breast CA s/p lumpectomy 2018, uterine CA s/p hysterectomy 1991 who presented to the ER with worsening shortness of breath. She has chronic lower extremity edema which she states is no worse than usual.  Her biggest complaint was her worsening dyspnea. She also states that she has hit her right lower leg against a dresser at home when it got stuck and she has a laceration that she has been keeping covered with a dry bandage at home.  Injury occurred approximately 2 weeks ago and despite covering with dry bandage, she states that the wound has continued to worsen and started to have some mild drainage. She has not been on any prior antibiotics.  She denies any fevers or chills. X-ray of right leg obtained in the ER shows diffuse soft tissue swelling in the subcutaneous plane. CXR also shows pulmonary vascular congestion.  BNP elevated 2,134.  WBC normal at 6.3 and normal PLTC 196k.   She was given a dose of Lasix in the ER with immediate response in voiding.  Concern was for CHF exacerbation as well as possible worsening of right leg injury leading to some mild cellulitis.  She will be started on antibiotics and diuresis and admitted for further monitoring.

## 2022-03-28 ENCOUNTER — Encounter (HOSPITAL_COMMUNITY): Payer: Self-pay | Admitting: Internal Medicine

## 2022-03-28 DIAGNOSIS — I480 Paroxysmal atrial fibrillation: Secondary | ICD-10-CM | POA: Diagnosis not present

## 2022-03-28 DIAGNOSIS — L03115 Cellulitis of right lower limb: Secondary | ICD-10-CM | POA: Diagnosis not present

## 2022-03-28 DIAGNOSIS — I5043 Acute on chronic combined systolic (congestive) and diastolic (congestive) heart failure: Secondary | ICD-10-CM | POA: Diagnosis not present

## 2022-03-28 LAB — CBC WITH DIFFERENTIAL/PLATELET
Abs Immature Granulocytes: 0.01 10*3/uL (ref 0.00–0.07)
Basophils Absolute: 0 10*3/uL (ref 0.0–0.1)
Basophils Relative: 1 %
Eosinophils Absolute: 0 10*3/uL (ref 0.0–0.5)
Eosinophils Relative: 0 %
HCT: 39.2 % (ref 36.0–46.0)
Hemoglobin: 11.7 g/dL — ABNORMAL LOW (ref 12.0–15.0)
Immature Granulocytes: 0 %
Lymphocytes Relative: 20 %
Lymphs Abs: 1.1 10*3/uL (ref 0.7–4.0)
MCH: 28 pg (ref 26.0–34.0)
MCHC: 29.8 g/dL — ABNORMAL LOW (ref 30.0–36.0)
MCV: 93.8 fL (ref 80.0–100.0)
Monocytes Absolute: 0.5 10*3/uL (ref 0.1–1.0)
Monocytes Relative: 9 %
Neutro Abs: 3.8 10*3/uL (ref 1.7–7.7)
Neutrophils Relative %: 70 %
Platelets: 167 10*3/uL (ref 150–400)
RBC: 4.18 MIL/uL (ref 3.87–5.11)
RDW: 21.6 % — ABNORMAL HIGH (ref 11.5–15.5)
WBC: 5.5 10*3/uL (ref 4.0–10.5)
nRBC: 0 % (ref 0.0–0.2)

## 2022-03-28 LAB — MAGNESIUM: Magnesium: 1.9 mg/dL (ref 1.7–2.4)

## 2022-03-28 LAB — BASIC METABOLIC PANEL
Anion gap: 8 (ref 5–15)
BUN: 20 mg/dL (ref 8–23)
CO2: 29 mmol/L (ref 22–32)
Calcium: 8.5 mg/dL — ABNORMAL LOW (ref 8.9–10.3)
Chloride: 106 mmol/L (ref 98–111)
Creatinine, Ser: 0.99 mg/dL (ref 0.44–1.00)
GFR, Estimated: 57 mL/min — ABNORMAL LOW (ref >60)
Glucose, Bld: 97 mg/dL (ref 70–99)
Potassium: 2.8 mmol/L — ABNORMAL LOW (ref 3.5–5.1)
Sodium: 143 mmol/L (ref 135–145)

## 2022-03-28 LAB — T4, FREE: Free T4: 1.54 ng/dL — ABNORMAL HIGH (ref 0.61–1.12)

## 2022-03-28 LAB — TSH: TSH: 9.891 u[IU]/mL — ABNORMAL HIGH (ref 0.350–4.500)

## 2022-03-28 MED ORDER — MAGNESIUM SULFATE 2 GM/50ML IV SOLN
2.0000 g | Freq: Once | INTRAVENOUS | Status: AC
  Administered 2022-03-28: 2 g via INTRAVENOUS
  Filled 2022-03-28: qty 50

## 2022-03-28 MED ORDER — POTASSIUM CHLORIDE CRYS ER 20 MEQ PO TBCR
40.0000 meq | EXTENDED_RELEASE_TABLET | ORAL | Status: AC
  Administered 2022-03-28 (×3): 40 meq via ORAL
  Filled 2022-03-28 (×3): qty 2

## 2022-03-28 NOTE — TOC Progression Note (Signed)
Transition of Care [facility]) - Progression Note    Patient Details  Name: LYNNIE KOEHLER MRN: [id number] Date of Birth: 02/22/1936  [facility]) CM/SW Contact  Leeroy Cha, RN Phone Number: 03/28/2022, 1:23 PM  Clinical Narrative:    Orders for hhc ans heart failure program faxed to [facility] at [phone].  Request for confirmation of services requested.   Expected Discharge Plan: Home/Self Care Barriers to Discharge: Continued Medical Work up  Expected Discharge Plan and Services Expected Discharge Plan: Home/Self Care   Discharge Planning Services: CM Consult   Living arrangements for the past 2 months: Single Family Home                                       Social Determinants of Health (SDOH) Interventions    Readmission Risk Interventions   No data to display

## 2022-03-28 NOTE — Plan of Care (Signed)

## 2022-03-28 NOTE — TOC Initial Note (Signed)
Transition of Care [facility]) - Initial/Assessment Note    Patient Details  Name: Heidi Adams MRN: [id number] Date of Birth: 1935-12-12  Transition of Care [facility]) CM/SW Contact:    Leeroy Cha, RN Phone Number: 03/28/2022, 8:51 AM  Clinical Narrative:                  Transition of Care [facility]) Screening Note   Patient Details  Name: Heidi Adams Date of Birth: 07/28/1935   Transition of Care [facility]) CM/SW Contact:    Leeroy Cha, RN Phone Number: 03/28/2022, 8:51 AM    Transition of Care Department [facility]) has reviewed patient and no TOC needs have been identified at this time. We will continue to monitor patient advancement through interdisciplinary progression rounds. If new patient transition needs arise, please place a TOC consult.    Expected Discharge Plan: Home/Self Care Barriers to Discharge: Continued Medical Work up   Patient Goals and CMS Choice Patient states their goals for this hospitalization and ongoing recovery are:: to go home CMS Medicare.gov Compare Post Acute Care list provided to:: Patient    Expected Discharge Plan and Services Expected Discharge Plan: Home/Self Care   Discharge Planning Services: CM Consult   Living arrangements for the past 2 months: Single Family Home                                      Prior Living Arrangements/Services Living arrangements for the past 2 months: Single Family Home Lives with:: Spouse Patient language and need for interpreter reviewed:: Yes Do you feel safe going back to the place where you live?: Yes            Criminal Activity/Legal Involvement Pertinent to Current Situation/Hospitalization: No - Comment as needed  Activities of Daily Living      Permission Sought/Granted                  Emotional Assessment Appearance:: Appears stated age Attitude/Demeanor/Rapport: Engaged Affect (typically observed): Calm Orientation: : Oriented to Self, Oriented to Place, Oriented  to  Time, Oriented to Situation Alcohol / Substance Use: Tobacco Use (quit 34 years ago, no etoh or drug use) Psych Involvement: No (comment)  Admission diagnosis:  CHF exacerbation ([place]) [I50.9] Cellulitis of right lower extremity [J18.841] Acute on chronic congestive heart failure, unspecified heart failure type ([place]) [I50.9] Patient Active Problem List   Diagnosis Date Noted   Cellulitis 03/27/2022   HTN (hypertension) 03/27/2022   Acute on chronic combined systolic and diastolic CHF (congestive heart failure) ([place]) 07/12/2021   Hypocalcemia 07/12/2021   Normocytic anemia 07/12/2021   GERD (gastroesophageal reflux disease) 07/12/2021   Contusion of left hip region 05/26/2021   Vertigo 04/25/2021   Nonischemic cardiomyopathy ([place]) 02/24/2021   Orthostatic hypotension 02/24/2021   Family history of colonic polyps 10/12/2020   Gastroesophageal reflux disease with esophagitis and hemorrhage 10/12/2020   Slow transit constipation 10/12/2020   Iron deficiency anemia due to chronic blood loss 06/24/2020   A-fib ([place]) 03/23/2020   Atrial fibrillation with RVR ([place]) 03/22/2020   History of congestive heart failure 03/22/2020   Swelling of right foot 10/25/2019   Dizziness 06/22/2019   Arthritis of wrist 66/12/3014   Chronic systolic heart failure ([place]) 09/22/2018   DDD (degenerative disc disease), cervical 08/20/2018   Left elbow pain 08/02/2018   TIA (transient ischemic attack) 07/30/2018   Hypertensive urgency 07/30/2018  AF (paroxysmal atrial fibrillation) ([place]) 07/30/2018   Arthritis of carpometacarpal ([facility]) joint of left thumb 06/20/2018   Pain in thumb joint with movement of left hand 05/08/2018   Pain in finger of right hand 01/02/2018   Conductive hearing loss, bilateral 10/19/2017   Dry nose 10/19/2017   Chronic hip pain after total replacement of left hip joint 08/25/2017   Trochanteric bursitis of left hip 08/25/2017   Localized, primary osteoarthritis of elbow  08/22/2017   Synovitis and tenosynovitis 08/11/2017   Malignant neoplasm of upper-inner quadrant of left breast in female, estrogen receptor positive ([place]) 06/20/2017   S/P closed reduction of dislocated total hip prosthesis 02/03/2017   S/P left THA, AA 01/10/2017   Fatigue 05/31/2016   Overweight (BMI 25.0-29.9) 05/11/2016   S/P right THA, AA 05/10/2016   Pes planus 03/16/2016   Abnormality of gait 03/16/2016   PAC (premature atrial contraction) 02/24/2016   PVC (premature ventricular contraction) 02/24/2016   Palpitations 01/27/2016   Benign paroxysmal positional vertigo of right ear 12/15/2015   Pharyngoesophageal dysphagia 12/15/2015   Hypothyroidism 11/26/2015   Supine hypertension 11/26/2015   S/P left TKA 25/85/2778   Lichen sclerosus 24/23/5361   PCP:  [name], MD Pharmacy:   [facility], [place] - [address] [place] [address] Phone: [phone] Fax: [phone]     Social Determinants of Health (SDOH) Interventions    Readmission Risk Interventions   No data to display

## 2022-03-28 NOTE — Evaluation (Signed)
Physical Therapy Evaluation Patient Details Name: Heidi Adams MRN: [id number] DOB: January 16, 1936 Today's Date: 03/28/2022  History of Present Illness  86 yo female with PMH chronic systolic diastolic CHF (TL<57%), HTN, hypothyroidism, left breast CA s/p lumpectomy 2018, uterine CA s/p hysterectomy 1991 who presented to the ER with worsening shortness of breath. Dx of CHF exacerbation, RLE cellulitis.  Clinical Impression  Pt admitted with above diagnosis. Pt ambulated 30' with RW, no loss of balance, distance limited by fatigue. Of concern, pt reports 4-5 falls in the past 6 months, due to tripping on objects. HHPT recommended for balance training and home safety assessment. Pt has 24* assistance from her husband. At baseline, he assists her with bathing, dressing, cooking, etc.  Pt currently with functional limitations due to the deficits listed below (see PT Problem List). Pt will benefit from skilled PT to increase their independence and safety with mobility to allow discharge to the venue listed below.          Recommendations for follow up therapy are one component of a multi-disciplinary discharge planning process, led by the attending physician.  Recommendations may be updated based on patient status, additional functional criteria and insurance authorization.  Follow Up Recommendations Home health PT      Assistance Recommended at Discharge Intermittent Supervision/Assistance  Patient can return home with the following  A little help with bathing/dressing/bathroom;A little help with walking and/or transfers;Assistance with cooking/housework;Help with stairs or ramp for entrance;Direct supervision/assist for medications management;Assist for transportation    Equipment Recommendations None recommended by PT  Recommendations for Other Services       Functional Status Assessment Patient has had a recent decline in their functional status and demonstrates the ability to make significant  improvements in function in a reasonable and predictable amount of time.     Precautions / Restrictions Precautions Precautions: Fall Precaution Comments: pt reports 4-5 falls in past 6 months, due to tripping on objects Restrictions Weight Bearing Restrictions: No      Mobility  Bed Mobility               General bed mobility comments: up in recliner    Transfers Overall transfer level: Needs assistance Equipment used: Rolling walker (2 wheels) Transfers: Sit to/from Stand Sit to Stand: Supervision                Ambulation/Gait Ambulation/Gait assistance: Min guard Gait Distance (Feet): 35 Feet Assistive device: Rolling walker (2 wheels) Gait Pattern/deviations: Step-through pattern, Decreased stride length, Trunk flexed Gait velocity: WFL     General Gait Details: steady, no loss of balance, distance limited by fatigue  Stairs            Wheelchair Mobility    Modified Rankin (Stroke Patients Only)       Balance Overall balance assessment: Needs assistance, History of Falls Sitting-balance support: Feet supported Sitting balance-Leahy Scale: Good     Standing balance support: Reliant on assistive device for balance, Bilateral upper extremity supported Standing balance-Leahy Scale: Fair                               Pertinent Vitals/Pain Pain Assessment Pain Assessment: Faces Faces Pain Scale: Hurts little more Pain Location: L hip with walking Pain Descriptors / Indicators: Sore Pain Intervention(s): Limited activity within patient's tolerance, Monitored during session, Repositioned    Home Living Family/patient expects to be discharged to:: Private residence Living Arrangements: Spouse/significant  other Available Help at Discharge: Family;Available 24 hours/day Type of Home: House Home Access: Stairs to enter Entrance Stairs-Rails: None Entrance Stairs-Number of Steps: 2   Home Layout: One level Home Equipment:  Conservation officer, nature (2 wheels);Shower seat;Cane - single point      Prior Function Prior Level of Function : Independent/Modified Independent             Mobility Comments: use of RW PRN in home and community distances ADLs Comments: assit as needed with ADL/IADL from husband (socks/shoes, washing back in the shower, cooking/cleaning)     Hand Dominance   Dominant Hand: Right    Extremity/Trunk Assessment   Upper Extremity Assessment Upper Extremity Assessment: Defer to OT evaluation    Lower Extremity Assessment Lower Extremity Assessment: Generalized weakness;LLE deficits/detail LLE Deficits / Details: unable to perform SLR, reports pain/weakness since THA several years ago LLE Sensation: WNL    Cervical / Trunk Assessment Cervical / Trunk Assessment: Kyphotic  Communication   Communication: No difficulties  Cognition Arousal/Alertness: Awake/alert Behavior During Therapy: WFL for tasks assessed/performed Overall Cognitive Status: Within Functional Limits for tasks assessed                                          General Comments      Exercises     Assessment/Plan    PT Assessment Patient needs continued PT services  PT Problem List Decreased mobility;Decreased strength;Decreased activity tolerance;Decreased balance;Pain       PT Treatment Interventions Gait training;Therapeutic exercise;Functional mobility training;Balance training;Therapeutic activities;Cognitive remediation    PT Goals (Current goals can be found in the Care Plan section)  Acute Rehab PT Goals Patient Stated Goal: return home PT Goal Formulation: With patient/family Time For Goal Achievement: 04/11/22 Potential to Achieve Goals: Good    Frequency Min 3X/week     Co-evaluation               AM-PAC PT "6 Clicks" Mobility  Outcome Measure Help needed turning from your back to your side while in a flat bed without using bedrails?: A Little Help needed moving  from lying on your back to sitting on the side of a flat bed without using bedrails?: A Little Help needed moving to and from a bed to a chair (including a wheelchair)?: A Little Help needed standing up from a chair using your arms (e.g., wheelchair or bedside chair)?: A Little Help needed to walk in hospital room?: A Little Help needed climbing 3-5 steps with a railing? : A Little 6 Click Score: 18    End of Session Equipment Utilized During Treatment: Gait belt Activity Tolerance: Patient limited by fatigue;Patient tolerated treatment well Patient left: in chair;with chair alarm set;with call bell/phone within reach;with family/visitor present Nurse Communication: Mobility status PT Visit Diagnosis: Difficulty in walking, not elsewhere classified (R26.2);History of falling (Z91.81);Repeated falls (R29.6);Muscle weakness (generalized) (M62.81)    Time: 1000-1012 PT Time Calculation (min) (ACUTE ONLY): 12 min   Charges:   PT Evaluation $PT Eval Moderate Complexity: 1 Mod          Philomena Doheny PT 03/28/2022  Acute Rehabilitation Services  Office [phone]

## 2022-03-28 NOTE — Progress Notes (Signed)
Progress Note    [name]   ZOX:[id number]  DOB: 12-15-1935  DOA: 03/27/2022     1 PCP: [name], MD  Initial CC: SOB, LE edema  Hospital Course: [name] is an 86 yo female with PMH chronic systolic diastolic CHF (WJ<19%), HTN, hypothyroidism, left breast CA s/p lumpectomy 2018, uterine CA s/p hysterectomy 1991 who presented to the ER with worsening shortness of breath. She has chronic lower extremity edema which she states is no worse than usual.  Her biggest complaint was her worsening dyspnea. She also states that she has hit her right lower leg against a dresser at home when it got stuck and she has a laceration that she has been keeping covered with a dry bandage at home.  Injury occurred approximately 2 weeks ago and despite covering with dry bandage, she states that the wound has continued to worsen and started to have some mild drainage. She has not been on any prior antibiotics.  She denies any fevers or chills. X-ray of right leg obtained in the ER shows diffuse soft tissue swelling in the subcutaneous plane. CXR also shows pulmonary vascular congestion.  BNP elevated 2,134.  WBC normal at 6.3 and normal PLTC 196k.   She was given a dose of Lasix in the ER with immediate response in voiding.  Concern was for CHF exacerbation as well as possible worsening of right leg injury leading to some mild cellulitis.  She will be started on antibiotics and diuresis and admitted for further monitoring.  Interval History:  Patient feeling a little better today in terms of her shortness of breath.  She has also been voiding quite a bit since admission and being started on IV Lasix.  Edema in her legs is also improved today.  Her right lower extremity wound was dressed on admission as well.  Husband also present bedside this afternoon when seen.  Questions answered and update given.  Assessment and Plan: * Acute on chronic combined systolic and diastolic CHF (congestive heart  failure) (HCC) - presents with pulmonary crackles, cardiomegaly, B/L LE edema, DOE/SOB - BNP 2,134 - compliant with lasix - start on lasix 40 mg IV daily - last echo reviewed from June 2023: EF < 20%, Grade III DD, moderate LVH, hypokinetic global wall motion, moderate MR, mild TR - follows with Dr. [name], will inform of admission -Continue strict I&O   Cellulitis - s/p injury to RLE on lower leg anterior surface; does have some mild purulent appearing drainage but likely confounded some by chronic severe LE edema in setting of CHF - no fever or leukocytosis; mostly erythema and TTP - start rocephin and monitor response - s/p xray; hold off on further imaging at this time  - follow up cultures - wound care RN consulted, follow rec's:   "Wound care to right pretibial full thickness wound:   Cleanse with NS, pat gently dry. Cover with size  appropriate piece of Xeroform gauze, top with dry gauze  4x4, secure with a few turns of Kerlix roll gauze, paper tape.  Place foot into Prevalon boot."  HTN (hypertension) - Continue losartan and Toprol -Continue amiodarone  A-fib (HCC) - continue Eliquis and Toprol   Hypothyroidism - Continue Synthroid - recheck TSH (still elevated now at 9.891 but improved from 17.2 about 8 months ago) - check FT4 and TT3   Old records reviewed in assessment of this patient  Antimicrobials: Rocephin 03/27/2022 >> current  DVT prophylaxis:  apixaban (ELIQUIS) tablet 2.5  mg Start: 03/27/22 2200 apixaban (ELIQUIS) tablet 2.5 mg   Code Status:   Code Status: DNR  Mobility Assessment (last 72 hours)     Mobility Assessment     Row Name 03/28/22 1018           What is the highest level of mobility based on the progressive mobility assessment? Level 5 (Walks with assist in room/hall) - Balance while stepping forward/back and can walk in room with assist - Complete                Barriers to discharge: None Disposition Plan: Home in 2 to 3  days Status is: Inpatient  Objective: Blood pressure 128/62, pulse 60, temperature 98.6 F (37 C), temperature source Oral, resp. rate 14, height '5\' 4"'$  (1.626 m), weight 56.7 kg, last menstrual period 07/25/1988, SpO2 100 %.  Examination:  Physical Exam Constitutional:      General: She is not in acute distress.    Comments: Much more comfortable appearing today and less short of breath  HENT:     Head: Normocephalic and atraumatic.     Mouth/Throat:     Mouth: Mucous membranes are moist.  Eyes:     Extraocular Movements: Extraocular movements intact.     Pupils: Pupils are equal, round, and reactive to light.  Cardiovascular:     Rate and Rhythm: Normal rate and regular rhythm.  Pulmonary:     Comments: Improved crackles but still present.  No further expiratory wheezing Abdominal:     General: Bowel sounds are normal. There is no distension.     Palpations: Abdomen is soft.     Tenderness: There is no abdominal tenderness.  Musculoskeletal:     Cervical back: Normal range of motion and neck supple.     Comments: Improved B/L LE edema (now 1-2+). RLE wound covered with dressing  Skin:    General: Skin is warm and dry.  Neurological:     General: No focal deficit present.     Mental Status: She is alert.  Psychiatric:        Mood and Affect: Mood normal.      Consultants:  Cardiology  Procedures:    Data Reviewed: Results for orders placed or performed during the hospital encounter of 03/27/22 (from the past 24 hour(s))  Blood culture (routine x 2)     Status: None (Preliminary result)   Collection Time: 03/27/22  2:15 PM   Specimen: Right Antecubital; Blood  Result Value Ref Range   Specimen Description      RIGHT ANTECUBITAL BLOOD Performed at [facility], [address], [place], [place] [address]    Special Requests      BOTTLES DRAWN AEROBIC AND ANAEROBIC Blood Culture adequate volume Performed at [facility] [address], [place], [place] [address]    Culture      NO GROWTH < 24 HOURS Performed at [facility] [address], [place], [place] [address]    Report Status PENDING   Blood culture (routine x 2)     Status: None (Preliminary result)   Collection Time: 03/27/22  2:30 PM   Specimen: BLOOD RIGHT ARM  Result Value Ref Range   Specimen Description      BLOOD RIGHT ARM Performed at [facility] [address] [place], [place] [address]    Special Requests      BOTTLES DRAWN AEROBIC AND ANAEROBIC Blood Culture adequate volume Performed  at [facility], [place] [address] [place], [place] [address]    Culture      NO GROWTH < 24 HOURS Performed at [facility] [address], [place], [place] [address]    Report Status PENDING   Lactic acid, plasma     Status: Abnormal   Collection Time: 03/27/22  2:38 PM  Result Value Ref Range   Lactic Acid, Venous 2.2 (HH) 0.5 - 1.9 mmol/L  CBC with Differential     Status: Abnormal   Collection Time: 03/27/22  2:39 PM  Result Value Ref Range   WBC 6.3 4.0 - 10.5 K/uL   RBC 4.77 3.87 - 5.11 MIL/uL   Hemoglobin 13.3 12.0 - 15.0 g/dL   HCT 44.9 36.0 - 46.0 %   MCV 94.1 80.0 - 100.0 fL   MCH 27.9 26.0 - 34.0 pg   MCHC 29.6 (L) 30.0 - 36.0 g/dL   RDW 22.2 (H) 11.5 - 15.5 %   Platelets 196 150 - 400 K/uL   nRBC 0.0 0.0 - 0.2 %   Neutrophils Relative % 80 %   Neutro Abs 5.1 1.7 - 7.7 K/uL   Lymphocytes Relative 13 %   Lymphs Abs 0.8 0.7 - 4.0 K/uL   Monocytes Relative 5 %   Monocytes Absolute 0.3 0.1 - 1.0 K/uL   Eosinophils Relative 1 %   Eosinophils Absolute 0.0 0.0 - 0.5 K/uL   Basophils Relative 1 %   Basophils Absolute 0.0 0.0 - 0.1 K/uL   Immature Granulocytes 0 %   Abs Immature Granulocytes 0.02 0.00 - 0.07 K/uL  Basic metabolic panel     Status: Abnormal   Collection Time: 03/27/22  2:39 PM  Result Value Ref Range   Sodium 143 135 - 145 mmol/L   Potassium 4.5 3.5 - 5.1 mmol/L    Chloride 106 98 - 111 mmol/L   CO2 26 22 - 32 mmol/L   Glucose, Bld 116 (H) 70 - 99 mg/dL   BUN 21 8 - 23 mg/dL   Creatinine, Ser 1.06 (H) 0.44 - 1.00 mg/dL   Calcium 8.7 (L) 8.9 - 10.3 mg/dL   GFR, Estimated 52 (L) >60 mL/min   Anion gap 11 5 - 15  Troponin I (High Sensitivity)     Status: None   Collection Time: 03/27/22  2:39 PM  Result Value Ref Range   Troponin I (High Sensitivity) 15 <18 ng/L  Brain natriuretic peptide     Status: Abnormal   Collection Time: 03/27/22  2:40 PM  Result Value Ref Range   B Natriuretic Peptide 2,134.0 (H) 0.0 - 100.0 pg/mL  Lactic acid, plasma     Status: None   Collection Time: 03/27/22  3:46 PM  Result Value Ref Range   Lactic Acid, Venous 1.7 0.5 - 1.9 mmol/L  Troponin I (High Sensitivity)     Status: None   Collection Time: 03/27/22  3:46 PM  Result Value Ref Range   Troponin I (High Sensitivity) 17 <18 ng/L  Magnesium     Status: None   Collection Time: 03/28/22  4:13 AM  Result Value Ref Range   Magnesium 1.9 1.7 - 2.4 mg/dL  TSH     Status: Abnormal   Collection Time: 03/28/22  4:13 AM  Result Value Ref Range   TSH 9.891 (H) 0.350 - 4.500 uIU/mL  Basic metabolic panel     Status: Abnormal   Collection Time: 03/28/22  4:13 AM  Result Value Ref  Range   Sodium 143 135 - 145 mmol/L   Potassium 2.8 (L) 3.5 - 5.1 mmol/L   Chloride 106 98 - 111 mmol/L   CO2 29 22 - 32 mmol/L   Glucose, Bld 97 70 - 99 mg/dL   BUN 20 8 - 23 mg/dL   Creatinine, Ser 0.99 0.44 - 1.00 mg/dL   Calcium 8.5 (L) 8.9 - 10.3 mg/dL   GFR, Estimated 57 (L) >60 mL/min   Anion gap 8 5 - 15  CBC with Differential/Platelet     Status: Abnormal   Collection Time: 03/28/22  4:13 AM  Result Value Ref Range   WBC 5.5 4.0 - 10.5 K/uL   RBC 4.18 3.87 - 5.11 MIL/uL   Hemoglobin 11.7 (L) 12.0 - 15.0 g/dL   HCT 39.2 36.0 - 46.0 %   MCV 93.8 80.0 - 100.0 fL   MCH 28.0 26.0 - 34.0 pg   MCHC 29.8 (L) 30.0 - 36.0 g/dL   RDW 21.6 (H) 11.5 - 15.5 %   Platelets 167 150 - 400  K/uL   nRBC 0.0 0.0 - 0.2 %   Neutrophils Relative % 70 %   Neutro Abs 3.8 1.7 - 7.7 K/uL   Lymphocytes Relative 20 %   Lymphs Abs 1.1 0.7 - 4.0 K/uL   Monocytes Relative 9 %   Monocytes Absolute 0.5 0.1 - 1.0 K/uL   Eosinophils Relative 0 %   Eosinophils Absolute 0.0 0.0 - 0.5 K/uL   Basophils Relative 1 %   Basophils Absolute 0.0 0.0 - 0.1 K/uL   Immature Granulocytes 0 %   Abs Immature Granulocytes 0.01 0.00 - 0.07 K/uL   Polychromasia PRESENT    Ovalocytes PRESENT     I have Reviewed nursing notes, Vitals, and Lab results since pt's last encounter. Pertinent lab results : see above I have ordered test including BMP, CBC, Mg I have reviewed the last note from staff over past 24 hours I have discussed pt's care plan and test results with nursing staff, case manager   LOS: 1 day   Dwyane Dee, MD [facility] 03/28/2022, 12:46 PM

## 2022-03-28 NOTE — Consult Note (Signed)
CARDIOLOGY CONSULT NOTE  Patient ID: Heidi Adams MRN: [id number] DOB/AGE: 1936-07-03 86 y.o.  Admit date: 03/27/2022 Referring Physician  Dwyane Dee, MD Primary Physician:  [name], MD Reason for Consultation  CHF  Patient ID: Heidi Adams, female    DOB: 05/25/1936, 86 y.o.   MRN: [id number]  Chief Complaint  Patient presents with   Shortness of Breath   Chest Pain   HPI:    Heidi Adams  is a 86 y.o. with nonischemic cardiomyopathy with severe LV systolic dysfunction, supine hypertension and orthostatic hypotension, paroxysmal atrial fibrillation with cardioversion on 03/27/2020 and maintaining sinus rhythm on amiodarone, history of GI bleed again in September 2021 but now tolerating Eliquis without bleeding diathesis, stage IIIa chronic kidney disease, chronic bilateral lower extremity edema, multiple medication intolerance who presents with worsening leg edema and also worsening dyspnea.  She also has had a traumatic injury to her leg and noticed mild discharge and wanted to be further evaluated and presented to the emergency room.  No fever or chills, has chronic orthopnea but no PND.  Denies chest pain or palpitations.  Past medical history also includes bilateral hip replacement and knee replacement and left breast lumpectomy in 2018 without chemotherapy or radiation therapy for breast cancer and also remote hysterectomy due to uterine cancer in 1991.  Patient states that since she has been in the hospital, dyspnea is improved, leg edema has improved as well.  Appetite is improved, presently sitting on the side of the bed with her husband and sharing the lunch.  Past Medical History:  Diagnosis Date   Anemia    hx of anemia   Arthritis    Breast cancer ([place])    Left breast   Chronic combined systolic and diastolic CHF (congestive heart failure) ([place]) 09/22/2018   History of skin cancer    Hypertension    Hypothyroidism    PVC (premature ventricular contraction)    Past  Surgical History:  Procedure Laterality Date   ABDOMINAL HYSTERECTOMY  90   TAH BSO   APPENDECTOMY     BREAST LUMPECTOMY Left 05/30/2017   BREAST LUMPECTOMY WITH RADIOACTIVE SEED LOCALIZATION Left 05/30/2017   Procedure: LEFT BREAST LUMPECTOMY WITH RADIOACTIVE SEED LOCALIZATION;  Surgeon: Excell Seltzer, MD;  Location: [facility];  Service: General;  Laterality: Left;   CATARACT EXTRACTION  09/10/2018   Right eye   CHOLECYSTECTOMY     dr Georgia Lopes   ELBOW SURGERY     EYE SURGERY     "on my eyelids" years ago   KNEE ARTHROSCOPY     left   THYROIDECTOMY  1973   TONSILLECTOMY     TOTAL HIP ARTHROPLASTY Right 05/10/2016   Procedure: TOTAL HIP ARTHROPLASTY ANTERIOR APPROACH;  Surgeon: Paralee Cancel, MD;  Location: [facility];  Service: Orthopedics;  Laterality: Right;   TOTAL HIP ARTHROPLASTY Left 01/10/2017   Procedure: LEFT TOTAL HIP ARTHROPLASTY ANTERIOR APPROACH;  Surgeon: Paralee Cancel, MD;  Location: [facility];  Service: Orthopedics;  Laterality: Left;   TOTAL KNEE ARTHROPLASTY Left 09/28/2015   Procedure: LEFT TOTAL KNEE ARTHROPLASTY;  Surgeon: Paralee Cancel, MD;  Location: [facility];  Service: Orthopedics;  Laterality: Left;   TOTAL KNEE ARTHROPLASTY Right 08/09/2016   Procedure: RIGHT TOTAL KNEE ARTHROPLASTY;  Surgeon: Paralee Cancel, MD;  Location: [facility];  Service: Orthopedics;  Laterality: Right;  Adductor Block   Total Left hip arthroplasty     01/10/17 Dr. Alvan Dame   Social History   Tobacco Use  Smoking status: Former    Packs/day: 1.00    Years: 20.00    Total pack years: 20.00    Types: Cigarettes    Quit date: 07/26/1987    Years since quitting: 34.6   Smokeless tobacco: Never  Substance Use Topics   Alcohol use: No    Alcohol/week: 0.0 standard drinks of alcohol    Family History  Problem Relation Age of Onset   Diabetes Father    Hypertension Father    Stroke Father    Pneumonia Sister    Cancer Brother    Other Brother        Brain tumor   Breast cancer  Cousin     Marital Status: Married  ROS  Review of Systems  Cardiovascular:  Positive for dyspnea on exertion and leg swelling. Negative for chest pain.  Musculoskeletal:  Positive for arthritis and back pain.   Objective      03/28/2022    7:27 AM 03/28/2022    5:00 AM 03/28/2022    4:49 AM  Vitals with BMI  Weight  125 lbs   BMI  68.03   Systolic 212  248  Diastolic 82  79  Pulse 66  63    Blood pressure (!) 156/82, pulse 66, temperature 98.6 F (37 C), temperature source Oral, resp. rate 14, height '5\' 4"'$  (1.626 m), weight 56.7 kg, last menstrual period 07/25/1988, SpO2 100 %.    Physical Exam Neck:     Vascular: No carotid bruit or JVD.  Cardiovascular:     Rate and Rhythm: Normal rate and regular rhythm. Occasional Extrasystoles are present.    Pulses: Intact distal pulses.     Heart sounds: Murmur heard.     Midsystolic murmur is present with a grade of 2/6 at the apex.     No gallop.  Pulmonary:     Effort: Pulmonary effort is normal.     Breath sounds: Examination of the right-lower field reveals rales. Examination of the left-lower field reveals rales. Rales present.  Abdominal:     General: Bowel sounds are normal.     Palpations: Abdomen is soft.  Musculoskeletal:     Right lower leg: Edema (2+ below knee edema. There is wound on the leg convered by surgical dressing) present.     Left lower leg: Edema (2+ ankle edema) present.    Laboratory examination:   Recent Labs    03/06/22 0858 03/27/22 1439 03/28/22 0413  NA 144 143 143  K 3.8 4.5 2.8*  CL 107 106 106  CO2 '28 26 29  '$ GLUCOSE 98 116* 97  BUN '23 21 20  '$ CREATININE 1.07* 1.06* 0.99  CALCIUM 8.9 8.7* 8.5*  GFRNONAA 52* 52* 57*   estimated creatinine clearance is 37.2 mL/min (by C-G formula based on SCr of 0.99 mg/dL).     Latest Ref Rng & Units 03/28/2022    4:13 AM 03/27/2022    2:39 PM 03/06/2022    8:58 AM  CMP  Glucose 70 - 99 mg/dL 97  116  98   BUN 8 - 23 mg/dL '20  21  23   '$ Creatinine 0.44  - 1.00 mg/dL 0.99  1.06  1.07   Sodium 135 - 145 mmol/L 143  143  144   Potassium 3.5 - 5.1 mmol/L 2.8  4.5  3.8   Chloride 98 - 111 mmol/L 106  106  107   CO2 22 - 32 mmol/L '29  26  28   '$ Calcium  8.9 - 10.3 mg/dL 8.5  8.7  8.9   Total Protein 6.5 - 8.1 g/dL   5.8   Total Bilirubin 0.3 - 1.2 mg/dL   0.8   Alkaline Phos 38 - 126 U/L   64   AST 15 - 41 U/L   16   ALT 0 - 44 U/L   12       Latest Ref Rng & Units 03/28/2022    4:13 AM 03/27/2022    2:39 PM 03/06/2022    8:58 AM  CBC  WBC 4.0 - 10.5 K/uL 5.5  6.3  5.8   Hemoglobin 12.0 - 15.0 g/dL 11.7  13.3  12.6   Hematocrit 36.0 - 46.0 % 39.2  44.9  43.2   Platelets 150 - 400 K/uL 167  196  165    Lab Results  Component Value Date   HGBA1C 5.0 07/31/2018   MPG 96.8 07/31/2018   TSH Recent Labs    07/27/21 1016 03/28/22 0413  TSH 17.200* 9.891*   BNP (last 3 results) Recent Labs    11/08/21 1325 03/06/22 0859 03/27/22 1440  BNP 2,098.6* 2,638.9* 2,134.0*   Cardiac Panel (last 3 results) Recent Labs    03/27/22 1439 03/27/22 1546  TROPONINIHS 15 17    Medications and allergies   Allergies  Allergen Reactions   Codeine Nausea Only   Epinephrine     Tachcardyia, chest pains tremors   Hydrocodone-Acetaminophen Other (See Comments)    Other reaction(s): sick on stomach per patient   Tenormin [Atenolol] Rash     Current Meds  Medication Sig   acetaminophen (TYLENOL) 500 MG tablet Take 1,000 mg by mouth 3 (three) times daily as needed for moderate pain.    amiodarone (PACERONE) 200 MG tablet Take 0.5 tablets (100 mg total) by mouth daily.   ELIQUIS 2.5 MG TABS tablet TAKE 1 TABLET BY MOUTH 2 TIMES A DAY (Patient taking differently: Take 2.5 mg by mouth 2 (two) times daily.)   furosemide (LASIX) 20 MG tablet Take 1 tablet (20 mg total) by mouth daily. Take 20 mg daily with additional doses if weight up by 3 Lbs or shortness of breath or swelling.   ipratropium-albuterol (DUONEB) 0.5-2.5 (3) MG/3ML SOLN Take 3 mLs by  nebulization every 6 (six) hours as needed (shortness of breath or wheezing).   levothyroxine (SYNTHROID) 125 MCG tablet Take 125 mcg by mouth daily.   losartan (COZAAR) 50 MG tablet TAKE 1 TABLET BY MOUTH EACH NIGHT AT BEDTIME (Patient taking differently: Take 25 mg by mouth daily.)   metoprolol succinate (TOPROL-XL) 25 MG 24 hr tablet TAKE 1 TABLET BY MOUTH EVERY DAY (Patient taking differently: Take 25 mg by mouth daily.)   pantoprazole (PROTONIX) 40 MG tablet Take 40 mg by mouth daily as needed (indigestion/heartburn.).    potassium chloride (KLOR-CON) 10 MEQ tablet Take 2 tablets (20 mEq total) by mouth every other day. Take every time with furosemide (Patient taking differently: Take 20 mEq by mouth every 3 (three) days. Taking every three days with furosemide)   Scheduled Meds:  amiodarone  100 mg Oral Daily   apixaban  2.5 mg Oral BID   furosemide  40 mg Intravenous Daily   levothyroxine  125 mcg Oral QAC breakfast   losartan  25 mg Oral Daily   metoprolol succinate  25 mg Oral Daily   potassium chloride  40 mEq Oral Q4H   Continuous Infusions:  cefTRIAXone (ROCEPHIN)  IV Stopped (03/27/22 1759)  magnesium sulfate bolus IVPB 2 g (03/28/22 0830)   PRN Meds:.acetaminophen, albuterol, ondansetron (ZOFRAN) IV   I/O last 3 completed shifts: In: -  Out: 1000 [Urine:1000] No intake/output data recorded.    Radiology:   DG Tibia/Fibula Right  Result Date: 03/27/2022 CLINICAL DATA:  Swelling, open wounds and skin EXAM: RIGHT TIBIA AND FIBULA - 2 VIEW COMPARISON:  Ankle radiographs done on 08/26/2019 FINDINGS: There is diffuse soft tissue swelling in subcutaneous plane throughout right lower leg extending to the ankle. There is previous right knee arthroplasty. No recent fracture or dislocation is seen. There are no focal lytic lesions. Dense calcification is seen in the plantar fascia. Small plantar spur is seen in calcaneus. Degenerative changes are noted in the intertarsal joints.  IMPRESSION: No recent fracture or dislocation is seen. There are no focal lytic lesions. If there is clinical suspicion for osteomyelitis, follow-up MRI may be considered. Other findings as described in the body of the report. Electronically Signed   By: Elmer Picker M.D.   On: 03/27/2022 15:48   DG Chest 2 View  Result Date: 03/27/2022 CLINICAL DATA:  Chest pain and shortness of breath. EXAM: CHEST - 2 VIEW COMPARISON:  03/06/2022 FINDINGS: The cardio pericardial silhouette is enlarged. There is pulmonary vascular congestion without overt pulmonary edema. Prominent skin fold noted over the lateral left lung. The visualized bony structures of the thorax are unremarkable. Telemetry leads overlie the chest. IMPRESSION: Enlarged cardiopericardial silhouette with pulmonary vascular congestion. Electronically Signed   By: Misty Stanley M.D.   On: 03/27/2022 13:38    Cardiac Studies:   Lexiscan Myoview stress test  04/23/2018: 1. The resting electrocardiogram demonstrated normal sinus rhythm, normal resting conduction and no resting arrhythmias.  Stress EKG is non-diagnostic for ischemia as it a pharmacologic stress using Lexiscan. Occasional PAC and PVC. The patient developed significant symptoms which included Chest Pain, Stomach pain, Headache.  2. The overall quality of the study is good. There is no evidence of abnormal lung activity. Stress and rest SPECT images demonstrate homogeneous tracer distribution throughout the myocardium. Gated SPECT imaging reveals diffuse global hypokinesis. The left ventricular ejection fraction was markedly depressed at (26%).   3. This is a high risk study due to severe LV systolic dysfunction. Findings may represent non ischemic cardiomyopathy.   PCV ECHOCARDIOGRAM COMPLETE 12/30/2021  Severely depressed LV systolic function with visual EF <20%. Left ventricle cavity is dilated. Moderate left ventricular hypertrophy. Hypokinetic global wall motion. Doppler  evidence of grade III (restrictive) diastolic dysfunction, elevated LAP. Left atrial cavity is severely dilated. Native trileaflet aortic valve.  Moderate (Grade II) aortic regurgitation. Aortic valve sclerosis without stenosis. Moderate (Grade II) mitral regurgitation. Mild tricuspid regurgitation. No evidence of pulmonary hypertension. RVSP measures 31 mmHg. Mild to moderate pulmonic regurgitation. Pericardium is normal. Insignificant pericardial effusion. There is no hemodynamic significance. IVC is dilated with a respiratory response of <50%. Compared to 03/04/2021 no significant change.   EKG:  03/27/2022: Atrial fibrillation with controlled ventricular response at the rate of 85 bpm, left axis deviation, left anterior fascicular block.  Incomplete right bundle branch block.  Poor R progression, cannot exclude anteroseptal infarct old.  Compared to 03/06/2022 and 01/26/2022, atrial fibrillation is new.  Assessment   1.  Acute on chronic systolic heart failure, suspect recurrence of atrial fibrillation to be the etiology. 2.  Nonischemic cardiomyopathy 3.  Paroxysmal atrial fibrillation, she was in sinus rhythm when I last saw her in July 2023. 4.  Supine hypertension and  orthostatic hypotension 5.  Chronic lower extremity edema, recent right leg injury and possible cellulitis  CHA2DS2-VASc Score is 5.  Yearly risk of stroke: 7.2% (A, F, HTN, CHF).  Score of 1=0.6; 2=2.2; 3=3.2; 4=4.8; 5=7.2; 6=9.8; 7=>9.8) -(CHF; HTN; vasc disease DM,  Female = 1; Age <65 =0; 65-74 = 1,  >75 =2; stroke/embolism= 2).    Recommendations:   Heidi Adams  is a 86 y.o. with nonischemic cardiomyopathy with severe LV systolic dysfunction, supine hypertension and orthostatic hypotension, paroxysmal atrial fibrillation with cardioversion on 03/27/2020 and maintaining sinus rhythm on amiodarone, history of GI bleed again in September 2021 but now tolerating Eliquis without bleeding diathesis, stage IIIa chronic  kidney disease, chronic bilateral lower extremity edema, multiple medication intolerance who presents with worsening leg edema and also worsening dyspnea.  She also has had a traumatic injury to her leg and noticed mild discharge and wanted to be further evaluated and presented to the emergency room.  No fever or chills, has chronic orthopnea but no PND.  Denies chest pain or palpitations.  Since hospital admission, she has diuresed well and leg edema has essentially resolved.  Today she does not appear to be in acute decompensated heart failure.  She has bibasilar crackles that are chronic.  She is also back in sinus rhythm on telemetry.  Suspect atrial fibrillation probably contributed to her acute decompensation.  Follow-up on BMP and also BMP tomorrow.  She would be a good candidate for Jardiance however UTI that she has had in the past may be an issue.  She does not want to be on Entresto.  Renal function has remained stable, in view of orthostatic hypotension, which should have related to hypertension.  For now would not change therapy.  Also patient is extremely cautious about what medications to take.  Continue IV diuretics for today, transition her back to p.o. furosemide tomorrow.  She appears to be ready for discharge tomorrow however patient wishes to stay back another day to make sure that she is not requiring to come back again.  Her husband is present and all questions answered.   Adrian Prows, MD, [facility] 03/28/2022, 8:55 AM Office: [phone]

## 2022-03-28 NOTE — Evaluation (Signed)
Occupational Therapy Evaluation Patient Details Name: Heidi Adams MRN: [id number] DOB: 1935-08-14 Today's Date: 03/28/2022   History of Present Illness 86 yo female with PMH chronic systolic diastolic CHF (LK<44%), HTN, hypothyroidism, left breast CA s/p lumpectomy 2018, uterine CA s/p hysterectomy 1991 who presented to the ER with worsening shortness of breath. Dx of CHF exacerbation, RLE cellulitis.   Clinical Impression   Heidi Adams is an 86 year old woman who presents with above medical history. On evaluation she presents near her baseline - needing assistance for LB ADLs and able to ambulate with walker. She was on RA and did not complain of dyspnea with ambulation. O2 sat 95%. Patient does not report increased shortness of breath or fatigue this morning. Patient has no OT needs. Will have assistance of husband at discharge.       Recommendations for follow up therapy are one component of a multi-disciplinary discharge planning process, led by the attending physician.  Recommendations may be updated based on patient status, additional functional criteria and insurance authorization.   Follow Up Recommendations  No OT follow up    Assistance Recommended at Discharge Intermittent Supervision/Assistance  Patient can return home with the following A little help with bathing/dressing/bathroom;Assistance with cooking/housework;Help with stairs or ramp for entrance    Functional Status Assessment  Patient has not had a recent decline in their functional status  Equipment Recommendations  None recommended by OT    Recommendations for Other Services       Precautions / Restrictions Precautions Precautions: Fall Precaution Comments: pt reports 4-5 falls in past 6 months, due to tripping on objects Restrictions Weight Bearing Restrictions: No      Mobility Bed Mobility Overal bed mobility: Needs Assistance Bed Mobility: Supine to Sit     Supine to sit: Min assist, HOB  elevated          Transfers Overall transfer level: Needs assistance Equipment used: Rolling walker (2 wheels) Transfers: Sit to/from Stand Sit to Stand: Min guard           General transfer comment: MIn guard to stand and ambulate around bed to recliner with walker. No physical assistance. NO overt loss of balance. Patient reports feeling close to her baseline in regards to dypsnea and strength.      Balance Overall balance assessment: Needs assistance Sitting-balance support: No upper extremity supported Sitting balance-Leahy Scale: Normal     Standing balance support: During functional activity Standing balance-Leahy Scale: Fair                             ADL either performed or assessed with clinical judgement   ADL Overall ADL's : At baseline                                       General ADL Comments: Needs assistance for bathing and LB ADLs. Demonstrated needing assistance for donning shoes today. Reports having a shoe horn at home. Otherwise demonstrated physical abiliteis to don clothing and perform toileting.     Vision Baseline Vision/History: 1 Wears glasses Patient Visual Report: No change from baseline       Perception     Praxis      Pertinent Vitals/Pain Pain Assessment Pain Assessment: No/denies pain     Hand Dominance Right   Extremity/Trunk Assessment Upper Extremity Assessment Upper Extremity  Assessment: RUE deficits/detail;LUE deficits/detail RUE Deficits / Details: WFL ROM, grossly 4/5 strength RUE Sensation: WNL RUE Coordination: WNL LUE Deficits / Details: Grossly functional ROM, hx of elbow injury. Atrophy throughout arm. grossly 3+/5 strength LUE Sensation: WNL   Lower Extremity Assessment Lower Extremity Assessment: Defer to PT evaluation LLE Deficits / Details: unable to perform SLR, reports pain/weakness since THA several years ago LLE Sensation: WNL   Cervical / Trunk Assessment Cervical /  Trunk Assessment: Kyphotic   Communication Communication Communication: No difficulties   Cognition Arousal/Alertness: Awake/alert Behavior During Therapy: WFL for tasks assessed/performed Overall Cognitive Status: Within Functional Limits for tasks assessed                                       General Comments       Exercises     Shoulder Instructions      Home Living Family/patient expects to be discharged to:: Private residence Living Arrangements: Spouse/significant other Available Help at Discharge: Family;Available 24 hours/day Type of Home: House Home Access: Stairs to enter [employer] of Steps: 2 Entrance Stairs-Rails: None Home Layout: One level     Bathroom Shower/Tub: Occupational psychologist: Standard Bathroom Accessibility: Yes   Home Equipment: Conservation officer, nature (2 wheels);Shower seat;Cane - single point          Prior Functioning/Environment Prior Level of Function : Independent/Modified Independent             Mobility Comments: use of RW PRN in home and community distances ADLs Comments: assit as needed with ADL/IADL from husband (socks/shoes, washing back in the shower, cooking/cleaning)        OT Problem List: Cardiopulmonary status limiting activity      OT Treatment/Interventions:      OT Goals(Current goals can be found in the care plan section) Acute Rehab OT Goals OT Goal Formulation: All assessment and education complete, DC therapy  OT Frequency:      Co-evaluation              AM-PAC OT "6 Clicks" Daily Activity     Outcome Measure Help from another person eating meals?: None Help from another person taking care of personal grooming?: None Help from another person toileting, which includes using toliet, bedpan, or urinal?: None Help from another person bathing (including washing, rinsing, drying)?: None Help from another person to put on and taking off regular upper body clothing?:  None Help from another person to put on and taking off regular lower body clothing?: None 6 Click Score: 24   End of Session Equipment Utilized During Treatment: Rolling walker (2 wheels) Nurse Communication:  (okay to see)  Activity Tolerance: Patient tolerated treatment well Patient left: in chair;with call bell/phone within reach;with chair alarm set;with family/visitor present  OT Visit Diagnosis: Muscle weakness (generalized) (M62.81)                Time: 5465-6812 OT Time Calculation (min): 17 min Charges:  OT General Charges $OT Visit: 1 Visit OT Evaluation $OT Eval Low Complexity: 1 Low  Gustavo Lah, OTR/L [place]  Office [phone]   Lenward Chancellor 03/28/2022, 1:34 PM

## 2022-03-29 ENCOUNTER — Other Ambulatory Visit: Payer: Self-pay

## 2022-03-29 ENCOUNTER — Emergency Department (HOSPITAL_COMMUNITY): Payer: PPO

## 2022-03-29 ENCOUNTER — Observation Stay (HOSPITAL_COMMUNITY)
Admission: EM | Admit: 2022-03-29 | Discharge: 2022-04-01 | Disposition: A | Discharge status: Skilled Nursing Facility | Payer: PPO | Attending: Internal Medicine | Admitting: Internal Medicine

## 2022-03-29 ENCOUNTER — Encounter (HOSPITAL_COMMUNITY): Payer: Self-pay

## 2022-03-29 DIAGNOSIS — R300 Dysuria: Secondary | ICD-10-CM | POA: Diagnosis not present

## 2022-03-29 DIAGNOSIS — R296 Repeated falls: Secondary | ICD-10-CM | POA: Diagnosis present

## 2022-03-29 DIAGNOSIS — Z87891 Personal history of nicotine dependence: Secondary | ICD-10-CM | POA: Diagnosis not present

## 2022-03-29 DIAGNOSIS — Z7901 Long term (current) use of anticoagulants: Secondary | ICD-10-CM | POA: Diagnosis not present

## 2022-03-29 DIAGNOSIS — L03115 Cellulitis of right lower limb: Secondary | ICD-10-CM | POA: Insufficient documentation

## 2022-03-29 DIAGNOSIS — I5022 Chronic systolic (congestive) heart failure: Secondary | ICD-10-CM | POA: Diagnosis not present

## 2022-03-29 DIAGNOSIS — I5043 Acute on chronic combined systolic (congestive) and diastolic (congestive) heart failure: Secondary | ICD-10-CM | POA: Diagnosis not present

## 2022-03-29 DIAGNOSIS — R519 Headache, unspecified: Secondary | ICD-10-CM | POA: Diagnosis not present

## 2022-03-29 DIAGNOSIS — I130 Hypertensive heart and chronic kidney disease with heart failure and stage 1 through stage 4 chronic kidney disease, or unspecified chronic kidney disease: Secondary | ICD-10-CM | POA: Diagnosis not present

## 2022-03-29 DIAGNOSIS — R5381 Other malaise: Principal | ICD-10-CM | POA: Insufficient documentation

## 2022-03-29 DIAGNOSIS — R509 Fever, unspecified: Secondary | ICD-10-CM | POA: Insufficient documentation

## 2022-03-29 DIAGNOSIS — Z85828 Personal history of other malignant neoplasm of skin: Secondary | ICD-10-CM | POA: Diagnosis not present

## 2022-03-29 DIAGNOSIS — N1831 Chronic kidney disease, stage 3a: Secondary | ICD-10-CM | POA: Insufficient documentation

## 2022-03-29 DIAGNOSIS — Z79899 Other long term (current) drug therapy: Secondary | ICD-10-CM | POA: Diagnosis not present

## 2022-03-29 DIAGNOSIS — R531 Weakness: Secondary | ICD-10-CM | POA: Diagnosis not present

## 2022-03-29 DIAGNOSIS — M25522 Pain in left elbow: Secondary | ICD-10-CM | POA: Insufficient documentation

## 2022-03-29 DIAGNOSIS — Z96653 Presence of artificial knee joint, bilateral: Secondary | ICD-10-CM | POA: Diagnosis not present

## 2022-03-29 DIAGNOSIS — W19XXXA Unspecified fall, initial encounter: Secondary | ICD-10-CM

## 2022-03-29 DIAGNOSIS — I480 Paroxysmal atrial fibrillation: Secondary | ICD-10-CM | POA: Insufficient documentation

## 2022-03-29 DIAGNOSIS — L0390 Cellulitis, unspecified: Secondary | ICD-10-CM | POA: Diagnosis present

## 2022-03-29 DIAGNOSIS — Z853 Personal history of malignant neoplasm of breast: Secondary | ICD-10-CM | POA: Diagnosis not present

## 2022-03-29 DIAGNOSIS — Z96643 Presence of artificial hip joint, bilateral: Secondary | ICD-10-CM | POA: Diagnosis not present

## 2022-03-29 DIAGNOSIS — Z8673 Personal history of transient ischemic attack (TIA), and cerebral infarction without residual deficits: Secondary | ICD-10-CM | POA: Diagnosis not present

## 2022-03-29 DIAGNOSIS — Z043 Encounter for examination and observation following other accident: Secondary | ICD-10-CM | POA: Diagnosis not present

## 2022-03-29 DIAGNOSIS — E039 Hypothyroidism, unspecified: Secondary | ICD-10-CM | POA: Insufficient documentation

## 2022-03-29 LAB — CBC WITH DIFFERENTIAL/PLATELET
Abs Immature Granulocytes: 0.02 10*3/uL (ref 0.00–0.07)
Abs Immature Granulocytes: 0.03 10*3/uL (ref 0.00–0.07)
Basophils Absolute: 0 10*3/uL (ref 0.0–0.1)
Basophils Absolute: 0 10*3/uL (ref 0.0–0.1)
Basophils Relative: 0 %
Basophils Relative: 0 %
Eosinophils Absolute: 0 10*3/uL (ref 0.0–0.5)
Eosinophils Absolute: 0 10*3/uL (ref 0.0–0.5)
Eosinophils Relative: 1 %
Eosinophils Relative: 0 %
HCT: 39.7 % (ref 36.0–46.0)
HCT: 39.2 % (ref 36.0–46.0)
Hemoglobin: 11.9 g/dL — ABNORMAL LOW (ref 12.0–15.0)
Hemoglobin: 11.8 g/dL — ABNORMAL LOW (ref 12.0–15.0)
Immature Granulocytes: 0 %
Immature Granulocytes: 0 %
Lymphocytes Relative: 19 %
Lymphocytes Relative: 11 %
Lymphs Abs: 0.9 10*3/uL (ref 0.7–4.0)
Lymphs Abs: 0.8 10*3/uL (ref 0.7–4.0)
MCH: 27.8 pg (ref 26.0–34.0)
MCH: 27.7 pg (ref 26.0–34.0)
MCHC: 30 g/dL (ref 30.0–36.0)
MCHC: 30.1 g/dL (ref 30.0–36.0)
MCV: 92.8 fL (ref 80.0–100.0)
MCV: 92 fL (ref 80.0–100.0)
Monocytes Absolute: 0.4 10*3/uL (ref 0.1–1.0)
Monocytes Absolute: 0.4 10*3/uL (ref 0.1–1.0)
Monocytes Relative: 8 %
Monocytes Relative: 6 %
Neutro Abs: 3.6 10*3/uL (ref 1.7–7.7)
Neutro Abs: 6 10*3/uL (ref 1.7–7.7)
Neutrophils Relative %: 72 %
Neutrophils Relative %: 83 %
Platelets: 174 10*3/uL (ref 150–400)
Platelets: 178 10*3/uL (ref 150–400)
RBC: 4.28 MIL/uL (ref 3.87–5.11)
RBC: 4.26 MIL/uL (ref 3.87–5.11)
RDW: 22 % — ABNORMAL HIGH (ref 11.5–15.5)
RDW: 21.8 % — ABNORMAL HIGH (ref 11.5–15.5)
WBC: 5 10*3/uL (ref 4.0–10.5)
WBC: 7.3 10*3/uL (ref 4.0–10.5)
nRBC: 0 % (ref 0.0–0.2)
nRBC: 0 % (ref 0.0–0.2)

## 2022-03-29 LAB — BASIC METABOLIC PANEL
Anion gap: 7 (ref 5–15)
Anion gap: 10 (ref 5–15)
BUN: 20 mg/dL (ref 8–23)
BUN: 25 mg/dL — ABNORMAL HIGH (ref 8–23)
CO2: 29 mmol/L (ref 22–32)
CO2: 24 mmol/L (ref 22–32)
Calcium: 8.2 mg/dL — ABNORMAL LOW (ref 8.9–10.3)
Calcium: 8.3 mg/dL — ABNORMAL LOW (ref 8.9–10.3)
Chloride: 105 mmol/L (ref 98–111)
Chloride: 102 mmol/L (ref 98–111)
Creatinine, Ser: 1.19 mg/dL — ABNORMAL HIGH (ref 0.44–1.00)
Creatinine, Ser: 1.23 mg/dL — ABNORMAL HIGH (ref 0.44–1.00)
GFR, Estimated: 45 mL/min — ABNORMAL LOW (ref >60)
GFR, Estimated: 44 mL/min — ABNORMAL LOW (ref >60)
Glucose, Bld: 102 mg/dL — ABNORMAL HIGH (ref 70–99)
Glucose, Bld: 105 mg/dL — ABNORMAL HIGH (ref 70–99)
Potassium: 4.5 mmol/L (ref 3.5–5.1)
Potassium: 4.5 mmol/L (ref 3.5–5.1)
Sodium: 141 mmol/L (ref 135–145)
Sodium: 136 mmol/L (ref 135–145)

## 2022-03-29 LAB — URINALYSIS, ROUTINE W REFLEX MICROSCOPIC
Bacteria, UA: NONE SEEN
Bilirubin Urine: NEGATIVE
Glucose, UA: NEGATIVE mg/dL
Ketones, ur: NEGATIVE mg/dL
Leukocytes,Ua: NEGATIVE
Nitrite: NEGATIVE
Protein, ur: NEGATIVE mg/dL
Specific Gravity, Urine: 1.014 (ref 1.005–1.030)
pH: 5 (ref 5.0–8.0)

## 2022-03-29 LAB — BRAIN NATRIURETIC PEPTIDE: B Natriuretic Peptide: 1987.9 pg/mL — ABNORMAL HIGH (ref 0.0–100.0)

## 2022-03-29 LAB — LACTIC ACID, PLASMA: Lactic Acid, Venous: 1.1 mmol/L (ref 0.5–1.9)

## 2022-03-29 LAB — MAGNESIUM: Magnesium: 2.3 mg/dL (ref 1.7–2.4)

## 2022-03-29 MED ORDER — CEPHALEXIN 500 MG PO CAPS
500.0000 mg | ORAL_CAPSULE | Freq: Two times a day (BID) | ORAL | Status: DC
  Administered 2022-03-29 – 2022-04-01 (×6): 500 mg via ORAL
  Filled 2022-03-29 (×6): qty 1

## 2022-03-29 MED ORDER — LEVOTHYROXINE SODIUM 112 MCG PO TABS: 112.0000 ug | ORAL_TABLET | Freq: Every day | ORAL | Status: DC

## 2022-03-29 MED ORDER — LEVOTHYROXINE SODIUM 125 MCG PO TABS
125.0000 ug | ORAL_TABLET | Freq: Every day | ORAL | Status: DC
  Administered 2022-03-30 – 2022-04-01 (×3): 125 ug via ORAL
  Filled 2022-03-29 (×3): qty 1

## 2022-03-29 MED ORDER — METOPROLOL SUCCINATE ER 25 MG PO TB24
25.0000 mg | ORAL_TABLET | Freq: Every day | ORAL | Status: DC
  Administered 2022-03-30 – 2022-04-01 (×3): 25 mg via ORAL
  Filled 2022-03-29 (×3): qty 1

## 2022-03-29 MED ORDER — FUROSEMIDE 40 MG PO TABS: 40.0000 mg | ORAL_TABLET | Freq: Every day | ORAL | Status: DC

## 2022-03-29 MED ORDER — ACETAMINOPHEN 650 MG RE SUPP: 650.0000 mg | Freq: Four times a day (QID) | RECTAL | Status: DC | PRN

## 2022-03-29 MED ORDER — FUROSEMIDE 40 MG PO TABS
40.0000 mg | ORAL_TABLET | Freq: Two times a day (BID) | ORAL | Status: AC
  Administered 2022-03-30 – 2022-03-31 (×4): 40 mg via ORAL
  Filled 2022-03-29 (×4): qty 1

## 2022-03-29 MED ORDER — POTASSIUM CHLORIDE CRYS ER 20 MEQ PO TBCR
20.0000 meq | EXTENDED_RELEASE_TABLET | Freq: Two times a day (BID) | ORAL | Status: DC
Start: 2022-03-29 — End: 2022-03-29
  Administered 2022-03-29: 20 meq via ORAL
  Filled 2022-03-29: qty 1

## 2022-03-29 MED ORDER — FUROSEMIDE 40 MG PO TABS
40.0000 mg | ORAL_TABLET | Freq: Two times a day (BID) | ORAL | Status: DC
  Administered 2022-03-29: 40 mg via ORAL
  Filled 2022-03-29: qty 1

## 2022-03-29 MED ORDER — APIXABAN 2.5 MG PO TABS
2.5000 mg | ORAL_TABLET | Freq: Two times a day (BID) | ORAL | Status: DC
  Administered 2022-03-29 – 2022-03-30 (×3): 2.5 mg via ORAL
  Filled 2022-03-29 (×4): qty 1

## 2022-03-29 MED ORDER — ACETAMINOPHEN 325 MG PO TABS
650.0000 mg | ORAL_TABLET | Freq: Four times a day (QID) | ORAL | Status: DC | PRN
  Administered 2022-03-29 – 2022-04-01 (×8): 650 mg via ORAL
  Filled 2022-03-29 (×9): qty 2

## 2022-03-29 MED ORDER — CEPHALEXIN 500 MG PO CAPS
500.0000 mg | ORAL_CAPSULE | Freq: Two times a day (BID) | ORAL | 0 refills | Status: DC
Start: 2022-03-29 — End: 2022-04-01

## 2022-03-29 MED ORDER — SENNOSIDES-DOCUSATE SODIUM 8.6-50 MG PO TABS
1.0000 | ORAL_TABLET | Freq: Every evening | ORAL | Status: DC | PRN
Start: 2022-03-29 — End: 2022-04-01

## 2022-03-29 MED ORDER — AMIODARONE HCL 100 MG PO TABS
100.0000 mg | ORAL_TABLET | Freq: Every day | ORAL | Status: DC
  Administered 2022-03-30 – 2022-04-01 (×3): 100 mg via ORAL
  Filled 2022-03-29 (×4): qty 1

## 2022-03-29 MED ORDER — FUROSEMIDE 40 MG PO TABS
20.0000 mg | ORAL_TABLET | Freq: Every day | ORAL | Status: DC
Start: 2022-03-30 — End: 2022-03-29

## 2022-03-29 MED ORDER — IPRATROPIUM-ALBUTEROL 0.5-2.5 (3) MG/3ML IN SOLN: 3.0000 mL | Freq: Four times a day (QID) | RESPIRATORY_TRACT | Status: DC | PRN

## 2022-03-29 MED ORDER — CEPHALEXIN 500 MG PO CAPS
500.0000 mg | ORAL_CAPSULE | Freq: Two times a day (BID) | ORAL | Status: DC
Start: 2022-03-29 — End: 2022-03-29
  Administered 2022-03-29: 500 mg via ORAL
  Filled 2022-03-29: qty 1

## 2022-03-29 MED ORDER — FUROSEMIDE 20 MG PO TABS: 20.0000 mg | ORAL_TABLET | Freq: Every day | ORAL | Status: DC

## 2022-03-29 NOTE — ED Triage Notes (Signed)
BIBA from home.  Fall after d/c from hospital today Pt complaining of weakness and skin tears to bilateral upper extremities.  Pt reports hitting head, denies LOC Pt on Eliquis  Family requesting facility placement.  A&O x4 on arrival.

## 2022-03-29 NOTE — Progress Notes (Signed)
Reviewed her labs. Will increase oral lasix to 40 mg  BID for 4 doses.  JG

## 2022-03-29 NOTE — H&P (Signed)
History and Physical    PHRONIA ASCH ZOX:[id number] DOB: June 08, 1936 DOA: 03/29/2022  PCP: Creola Corn, MD   Patient coming from: Home   Chief Complaint: Falls   HPI: Heidi Adams is a 86 y.o. female with medical history significant for chronic combined systolic and diastolic CHF, hypothyroidism, atrial fibrillation on Eliquis, CKD 3A, and recent admission for acute CHF who now returns to the ED after falling at home multiple times.  Patient was discharged from the hospital today after she was diuresed and treated for a right leg cellulitis, returned home where she lives with her husband, but was having difficulty going up the stairs at home due to general weakness and fatigue, and this resulted in a fall.  Patient had 2 more falls at home that she attributes to general weakness, states that she hit her head, but did not lose consciousness and does not believe she suffered any significant injury.  She reports approximately a week of dysuria but denies flank pain or gross hematuria.  She denies subjective fever or chills.  She denies cough, rhinorrhea, or sore throat.  ED Course: Upon arrival to the ED, patient is found to be febrile to 38 C and saturating well on room air with stable blood pressure.  No acute findings noted on CT head or CT cervical spine.  Chest x-ray negative for acute cardiopulmonary disease.  Chemistry panel notable for creatinine 1.23.  Review of Systems:  All other systems reviewed and apart from HPI, are negative.  Past Medical History:  Diagnosis Date   Anemia    hx of anemia   Arthritis    Breast cancer (HCC)    Left breast   Chronic combined systolic and diastolic CHF (congestive heart failure) (HCC) 09/22/2018   History of skin cancer    Hypertension    Hypothyroidism    PVC (premature ventricular contraction)     Past Surgical History:  Procedure Laterality Date   ABDOMINAL HYSTERECTOMY  90   TAH BSO   APPENDECTOMY     BREAST LUMPECTOMY Left 05/30/2017    BREAST LUMPECTOMY WITH RADIOACTIVE SEED LOCALIZATION Left 05/30/2017   Procedure: LEFT BREAST LUMPECTOMY WITH RADIOACTIVE SEED LOCALIZATION;  Surgeon: Glenna Fellows, MD;  Location: [facility];  Service: General;  Laterality: Left;   CATARACT EXTRACTION  09/10/2018   Right eye   CHOLECYSTECTOMY     dr Lovie Macadamia   ELBOW SURGERY     EYE SURGERY     "on my eyelids" years ago   KNEE ARTHROSCOPY     left   THYROIDECTOMY  1973   TONSILLECTOMY     TOTAL HIP ARTHROPLASTY Right 05/10/2016   Procedure: TOTAL HIP ARTHROPLASTY ANTERIOR APPROACH;  Surgeon: Durene Romans, MD;  Location: [facility];  Service: Orthopedics;  Laterality: Right;   TOTAL HIP ARTHROPLASTY Left 01/10/2017   Procedure: LEFT TOTAL HIP ARTHROPLASTY ANTERIOR APPROACH;  Surgeon: Durene Romans, MD;  Location: [facility];  Service: Orthopedics;  Laterality: Left;   TOTAL KNEE ARTHROPLASTY Left 09/28/2015   Procedure: LEFT TOTAL KNEE ARTHROPLASTY;  Surgeon: Durene Romans, MD;  Location: [facility];  Service: Orthopedics;  Laterality: Left;   TOTAL KNEE ARTHROPLASTY Right 08/09/2016   Procedure: RIGHT TOTAL KNEE ARTHROPLASTY;  Surgeon: Durene Romans, MD;  Location: [facility];  Service: Orthopedics;  Laterality: Right;  Adductor Block   Total Left hip arthroplasty     01/10/17 Dr. Charlann Boxer    Social History:   reports that she quit smoking about 34 years  ago. Her smoking use included cigarettes. She has a 20.00 pack-year smoking history. She has never used smokeless tobacco. She reports that she does not drink alcohol and does not use drugs.  Allergies  Allergen Reactions   Codeine Nausea Only   Epinephrine     Tachcardyia, chest pains tremors   Hydrocodone-Acetaminophen Other (See Comments)    Other reaction(s): sick on stomach per patient   Tenormin [Atenolol] Rash    Family History  Problem Relation Age of Onset   Diabetes Father    Hypertension Father    Stroke Father    Pneumonia Sister    Cancer Brother    Other Brother         Brain tumor   Breast cancer Cousin      Prior to Admission medications   Medication Sig Start Date End Date Taking? Authorizing Provider  potassium chloride (KLOR-CON) 10 MEQ tablet Take 2 tablets (20 mEq total) by mouth every other day. Take every time with furosemide Patient taking differently: Take 20 mEq by mouth daily. 02/24/21  Yes Yates Decamp, MD  acetaminophen (TYLENOL) 500 MG tablet Take 1,000 mg by mouth 3 (three) times daily as needed for moderate pain.     [provider]  amiodarone (PACERONE) 200 MG tablet Take 0.5 tablets (100 mg total) by mouth daily. 09/06/21   Yates Decamp, MD  cephALEXin (KEFLEX) 500 MG capsule Take 1 capsule (500 mg total) by mouth 2 (two) times daily for 5 days. 03/29/22 04/03/22  Lewie Chamber, MD  ELIQUIS 2.5 MG TABS tablet TAKE 1 TABLET BY MOUTH 2 TIMES A DAY Patient taking differently: Take 2.5 mg by mouth 2 (two) times daily. 01/26/22   Yates Decamp, MD  furosemide (LASIX) 20 MG tablet Take 1 tablet (20 mg total) by mouth daily. Take 20 mg daily with additional doses if weight up by 3 Lbs or shortness of breath or swelling. 12/14/21   Cantwell, Celeste C, PA-C  ipratropium-albuterol (DUONEB) 0.5-2.5 (3) MG/3ML SOLN Take 3 mLs by nebulization every 6 (six) hours as needed (shortness of breath or wheezing). 07/15/21   Arrien, York Ram, MD  levothyroxine (SYNTHROID) 125 MCG tablet Take 125 mcg by mouth daily. 10/27/21   [provider]  losartan (COZAAR) 50 MG tablet TAKE 1 TABLET BY MOUTH EACH NIGHT AT BEDTIME Patient taking differently: Take 25 mg by mouth daily. 02/08/22   Yates Decamp, MD  metoprolol succinate (TOPROL-XL) 25 MG 24 hr tablet TAKE 1 TABLET BY MOUTH EVERY DAY Patient taking differently: Take 25 mg by mouth 2 (two) times daily. 01/31/22   Yates Decamp, MD  pantoprazole (PROTONIX) 40 MG tablet Take 40 mg by mouth daily as needed (indigestion/heartburn.).  01/20/16   [provider]    Physical Exam: Vitals:    03/29/22 1916 03/29/22 1930 03/29/22 2302 03/29/22 2337  BP: 132/69 132/70 124/70 (!) 148/84  Pulse: 70 65 70 67  Resp: 15  18 16   Temp: (!) 100.4 F (38 C)  99.7 F (37.6 C) 98 F (36.7 C)  TempSrc: Oral  Oral Oral  SpO2: 96% 94% 95% 98%  Weight: 56.7 kg     Height: 5\' 4"  (1.626 m)       Constitutional: NAD, no pallor or diaphoresis  Eyes: PERTLA, lids and conjunctivae normal ENMT: Mucous membranes are moist. Posterior pharynx clear of any exudate or lesions.   Neck: supple, no masses  Respiratory:  no wheezing, no crackles. No accessory muscle use.  Cardiovascular: S1 &  S2 heard, regular rate and rhythm. No significant JVD. Abdomen: No distension, no tenderness, soft. Bowel sounds active.  Musculoskeletal: no clubbing / cyanosis. No joint deformity upper and lower extremities.   Skin: Skin tears involving b/l UEs. Warm, dry, well-perfused. Neurologic: CN 2-12 grossly intact. Sensation intact. Strength 4/5 and symmetric in all 4 limbs. Alert and oriented.  Psychiatric: Calm. Cooperative.    Labs and Imaging on Admission: I have personally reviewed following labs and imaging studies  CBC: Recent Labs  Lab 03/27/22 1439 03/28/22 0413 03/29/22 0438 03/29/22 2007  WBC 6.3 5.5 5.0 7.3  NEUTROABS 5.1 3.8 3.6 6.0  HGB 13.3 11.7* 11.9* 11.8*  HCT 44.9 39.2 39.7 39.2  MCV 94.1 93.8 92.8 92.0  PLT 196 167 174 178   Basic Metabolic Panel: Recent Labs  Lab 03/27/22 1439 03/28/22 0413 03/29/22 0438 03/29/22 2007  NA 143 143 141 136  K 4.5 2.8* 4.5 4.5  CL 106 106 105 102  CO2 26 29 29 24   GLUCOSE 116* 97 102* 105*  BUN 21 20 20  25*  CREATININE 1.06* 0.99 1.19* 1.23*  CALCIUM 8.7* 8.5* 8.2* 8.3*  MG  --  1.9 2.3  --    GFR: Estimated Creatinine Clearance: 29.9 mL/min (A) (by C-G formula based on SCr of 1.23 mg/dL (H)). Liver Function Tests: No results for input(s): "AST", "ALT", "ALKPHOS", "BILITOT", "PROT", "ALBUMIN" in the last 168 hours. No results for input(s):  "LIPASE", "AMYLASE" in the last 168 hours. No results for input(s): "AMMONIA" in the last 168 hours. Coagulation Profile: No results for input(s): "INR", "PROTIME" in the last 168 hours. Cardiac Enzymes: No results for input(s): "CKTOTAL", "CKMB", "CKMBINDEX", "TROPONINI" in the last 168 hours. BNP (last 3 results) No results for input(s): "PROBNP" in the last 8760 hours. HbA1C: No results for input(s): "HGBA1C" in the last 72 hours. CBG: No results for input(s): "GLUCAP" in the last 168 hours. Lipid Profile: No results for input(s): "CHOL", "HDL", "LDLCALC", "TRIG", "CHOLHDL", "LDLDIRECT" in the last 72 hours. Thyroid Function Tests: Recent Labs    03/28/22 0413 03/28/22 1012  TSH 9.891*  --   FREET4  --  1.54*   Anemia Panel: No results for input(s): "VITAMINB12", "FOLATE", "FERRITIN", "TIBC", "IRON", "RETICCTPCT" in the last 72 hours. Urine analysis:    Component Value Date/Time   COLORURINE YELLOW 03/29/2022 1922   APPEARANCEUR CLEAR 03/29/2022 1922   LABSPEC 1.014 03/29/2022 1922   PHURINE 5.0 03/29/2022 1922   GLUCOSEU NEGATIVE 03/29/2022 1922   HGBUR SMALL (A) 03/29/2022 1922   BILIRUBINUR NEGATIVE 03/29/2022 1922   KETONESUR NEGATIVE 03/29/2022 1922   PROTEINUR NEGATIVE 03/29/2022 1922   NITRITE NEGATIVE 03/29/2022 1922   LEUKOCYTESUR NEGATIVE 03/29/2022 1922   Sepsis Labs: @LABRCNTIP (procalcitonin:4,lacticidven:4) ) Recent Results (from the past 240 hour(s))  Blood culture (routine x 2)     Status: None (Preliminary result)   Collection Time: 03/27/22  2:15 PM   Specimen: Right Antecubital; Blood  Result Value Ref Range Status   Specimen Description   Final    RIGHT ANTECUBITAL BLOOD Performed at [facility], [address] [place], [place] [address]    Special Requests   Final    BOTTLES DRAWN AEROBIC AND ANAEROBIC Blood Culture adequate volume Performed at [facility], [address] [place], [place] [address]    Culture    Final    NO GROWTH 2 DAYS Performed at [facility], [address], [place], [place] [address]  Report Status PENDING  Incomplete  Blood culture (routine x 2)     Status: None (Preliminary result)   Collection Time: 03/27/22  2:30 PM   Specimen: BLOOD RIGHT ARM  Result Value Ref Range Status   Specimen Description   Final    BLOOD RIGHT ARM Performed at [facility], [address], [place], [place] [address]    Special Requests   Final    BOTTLES DRAWN AEROBIC AND ANAEROBIC Blood Culture adequate volume Performed at [facility], [address] [place], [place] [address]    Culture   Final    NO GROWTH 2 DAYS Performed at [facility], [address] [place], [place] [address]    Report Status PENDING  Incomplete     Radiological Exams on Admission: CT Head Wo Contrast  Result Date: 03/29/2022 CLINICAL DATA:  Headache.  Fall. EXAM: CT HEAD WITHOUT CONTRAST CT CERVICAL SPINE WITHOUT CONTRAST TECHNIQUE: Multidetector CT imaging of the head and cervical spine was performed following the standard protocol without intravenous contrast. Multiplanar CT image reconstructions of the cervical spine were also generated. RADIATION DOSE REDUCTION: This exam was performed according to the departmental dose-optimization program which includes automated exposure control, adjustment of the mA and/or kV according to patient size and/or use of iterative reconstruction technique. COMPARISON:  Head CT dated 02/14/2022. FINDINGS: CT HEAD FINDINGS Brain: Mild age-related atrophy and chronic microvascular ischemic changes. There is no acute intracranial hemorrhage. No mass effect or midline shift. No extra-axial fluid collection. Small partially calcified left frontal meningioma. Vascular: No hyperdense vessel or unexpected calcification. Skull: Normal. Negative for fracture or focal lesion. Sinuses/Orbits: No acute finding. Other: None CT CERVICAL SPINE  FINDINGS Alignment: No acute subluxation. Skull base and vertebrae: No acute fracture.  Osteopenia. Soft tissues and spinal canal: No prevertebral fluid or swelling. No visible canal hematoma. Disc levels:  No acute findings.  Degenerative changes. Upper chest: Negative. Other: Bilateral carotid bulb calcified plaques. IMPRESSION: 1. No acute intracranial pathology. Mild age-related atrophy and chronic microvascular ischemic changes. 2. No acute cervical spine fracture or subluxation. Electronically Signed   By: Elgie Collard M.D.   On: 03/29/2022 20:09   CT Cervical Spine Wo Contrast  Result Date: 03/29/2022 CLINICAL DATA:  Headache.  Fall. EXAM: CT HEAD WITHOUT CONTRAST CT CERVICAL SPINE WITHOUT CONTRAST TECHNIQUE: Multidetector CT imaging of the head and cervical spine was performed following the standard protocol without intravenous contrast. Multiplanar CT image reconstructions of the cervical spine were also generated. RADIATION DOSE REDUCTION: This exam was performed according to the departmental dose-optimization program which includes automated exposure control, adjustment of the mA and/or kV according to patient size and/or use of iterative reconstruction technique. COMPARISON:  Head CT dated 02/14/2022. FINDINGS: CT HEAD FINDINGS Brain: Mild age-related atrophy and chronic microvascular ischemic changes. There is no acute intracranial hemorrhage. No mass effect or midline shift. No extra-axial fluid collection. Small partially calcified left frontal meningioma. Vascular: No hyperdense vessel or unexpected calcification. Skull: Normal. Negative for fracture or focal lesion. Sinuses/Orbits: No acute finding. Other: None CT CERVICAL SPINE FINDINGS Alignment: No acute subluxation. Skull base and vertebrae: No acute fracture.  Osteopenia. Soft tissues and spinal canal: No prevertebral fluid or swelling. No visible canal hematoma. Disc levels:  No acute findings.  Degenerative changes. Upper chest:  Negative. Other: Bilateral carotid bulb calcified plaques. IMPRESSION: 1. No acute intracranial pathology. Mild age-related atrophy and chronic microvascular ischemic changes. 2. No acute cervical spine fracture  or subluxation. Electronically Signed   By: Elgie Collard M.D.   On: 03/29/2022 20:09   DG Chest 1 View  Result Date: 03/29/2022 CLINICAL DATA:  Fever EXAM: CHEST  1 VIEW COMPARISON:  03/27/2022 FINDINGS: Cardiomegaly. No acute airspace disease or effusion. Aortic atherosclerosis. No pneumothorax. IMPRESSION: No active disease.  Cardiomegaly. Electronically Signed   By: Jasmine Pang M.D.   On: 03/29/2022 19:45     Assessment/Plan   1. Debility, falls  - Presents with multiple falls at home after being discharged from the hospital earlier in the day; family wants her to go to SNF and pt in agreement  - No acute findings on head CT; suspect this is d/t deconditioning/debility, possible UTI   - Consult PT, continue supportive care    2. Fever  - She had transient fever in ED, but is not septic/has no other SIRS criteria - She is being treated for RLE cellulitis and complains of dysuria  - Check UA and urine culture, continue Keflex for now for RLE cellulitis   3. Cellulitis  - RLE cellulitis treated with Rocephin while in hospital recently, now on Keflex   - Not septic on admission, continue Keflex    4. Chronic combined systolic and diastolic CHF  - Recently admitted for acute CHF, now appears compensated  - Continue Lasix, hold losartan initially given increased creatinine, monitor weight and I/Os    5. CKD IIIa  - SCr is 1.23 on admission, up from baseline of 0.99  - Hold losartan initially, renally-dose medications, monitor    6. Atrial fibrillation  - In SR on admission  - Continue Eliquis, Toprol, amiodarone   7. Dysuria  - She complains of dysuria in ED, had transient fever but not septic/no other SIRS criteria or organ damage  - Check UA and culture    DVT  prophylaxis: Eliquis  Code Status: DNR  Level of Care: Level of care: Med-Surg Family Communication: Granddaughter updated in ED  Disposition Plan:  Patient is from: home  Anticipated d/c is to: SNF  Anticipated d/c date is: 9/6 or 03/31/22  Patient currently: Pending UA, PT eval  Consults called: none  Admission status: Observation     Briscoe Deutscher, MD [facility]  03/30/2022, 12:16 AM

## 2022-03-29 NOTE — Discharge Summary (Signed)
Physician Discharge Summary   Heidi Adams KZL:[id number] DOB: 1936-05-28 DOA: 03/27/2022  PCP: [name], MD  Admit date: 03/27/2022 Discharge date: 03/29/2022  Barriers to discharge: none  Admitted From: Home Disposition:  Home Discharging physician: Dwyane Dee, MD  Recommendations for Outpatient Follow-up:  Repeat thyroid studies at follow up; if still abnormal, may need lower dose Synthroid Follow up with cardiology  If edema recurs with lasix compliance, consider increase lasix dose   Home Health: RN, PT Equipment/Devices:   Discharge Condition: stable CODE STATUS: DNR Diet recommendation:  Diet Orders (From admission, onward)     Start     Ordered   03/29/22 0000  Diet - low sodium heart healthy        03/29/22 0928   03/27/22 1736  Diet Heart Room service appropriate? Yes; Fluid consistency: Thin  Diet effective now       Question Answer Comment  Room service appropriate? Yes   Fluid consistency: Thin      03/27/22 1735            Hospital Course: Heidi Adams is an 86 yo female with PMH chronic systolic diastolic CHF (TJ<03%), HTN, hypothyroidism, left breast CA s/p lumpectomy 2018, uterine CA s/p hysterectomy 1991 who presented to the ER with worsening shortness of breath. She has chronic lower extremity edema which she states is no worse than usual.  Her biggest complaint was her worsening dyspnea. She also states that she has hit her right lower leg against a dresser at home when it got stuck and she has a laceration that she has been keeping covered with a dry bandage at home.  Injury occurred approximately 2 weeks ago and despite covering with dry bandage, she states that the wound has continued to worsen and started to have some mild drainage. She has not been on any prior antibiotics.  She denies any fevers or chills. X-ray of right leg obtained in the ER shows diffuse soft tissue swelling in the subcutaneous plane. CXR also shows pulmonary vascular  congestion.  BNP elevated 2,134.  WBC normal at 6.3 and normal PLTC 196k.   She was given a dose of Lasix in the ER with immediate response in voiding.  Concern was for CHF exacerbation as well as possible worsening of right leg injury leading to some mild cellulitis.  She will be started on antibiotics and diuresis and admitted for further monitoring.  Assessment and Plan: * Acute on chronic combined systolic and diastolic CHF (congestive heart failure) ([place]) - presents with pulmonary crackles, cardiomegaly, B/L LE edema, DOE/SOB - BNP 2,134 -After further information obtained, she was not quite taking Lasix truly every day at home.  She is good about weighing herself consistently she says - last echo reviewed from June 2023: EF < 20%, Grade III DD, moderate LVH, hypokinetic global wall motion, moderate MR, mild TR - follows with Dr. [name] -Diuresed well during hospitalization with significant improvement in her lower extremity edema and resolution of her shortness of breath -Discharged on 2 days of Lasix 40 mg twice daily then followed by resuming home dose of Lasix 40 mg daily; this was indicated in her discharge -Outpatient follow-up with cardiology -Patient instructed to continue weighing herself daily   Cellulitis - s/p injury to RLE on lower leg anterior surface; does have some mild purulent appearing drainage but likely confounded some by chronic severe LE edema in setting of CHF - no fever or leukocytosis; mostly erythema and TTP - start rocephin  and monitor response; transitioned to Keflex to complete course at discharge - s/p xray; hold off on further imaging at this time  -Blood cultures remained negative at time of discharge - wound care RN consulted, follow rec's:   "Wound care to right pretibial full thickness wound:   Cleanse with NS, pat gently dry. Cover with size  appropriate piece of Xeroform gauze, top with dry gauze  4x4, secure with a few turns of Kerlix roll gauze,  paper tape.  Place foot into Prevalon boot." -Home health RN ordered at discharge  HTN (hypertension) - Continue losartan and Toprol -Continue amiodarone  A-fib (HCC) - continue Eliquis and Toprol   Hypothyroidism - Continue Synthroid - recheck TSH (still elevated now at 9.891 but improved from 17.2 about 8 months ago) - check FT4 (mildly elevated, 1.54) and TT3 (pending at discharge) - may need lower dose of synthroid, especially given new amiodarone addition - repeat thyroid studies at follow up and lower dose if still indicated       The patient's chronic medical conditions were treated accordingly per the patient's home medication regimen except as noted.  On day of discharge, patient was felt deemed stable for discharge. Patient/family member advised to call PCP or come back to ER if needed.   Principal Diagnosis: Acute on chronic combined systolic and diastolic CHF (congestive heart failure) ([place])  Discharge Diagnoses: Active Hospital Problems   Diagnosis Date Noted   Acute on chronic combined systolic and diastolic CHF (congestive heart failure) ([place]) 07/12/2021    Priority: 1.   Cellulitis 03/27/2022    Priority: 2.   HTN (hypertension) 03/27/2022   A-fib ([place]) 03/23/2020   Hypothyroidism 11/26/2015    Resolved Hospital Problems  No resolved problems to display.     Discharge Instructions     Diet - low sodium heart healthy   Complete by: As directed    Discharge wound care:   Complete by: As directed    Clean with saline and pat dry gently. Cover with xeroform gauze then dry gauze and secure with roll gauze then tape in place.   Increase activity slowly   Complete by: As directed       Allergies as of 03/29/2022       Reactions   Codeine Nausea Only   Epinephrine    Tachcardyia, chest pains tremors   Hydrocodone-acetaminophen Other (See Comments)   Other reaction(s): sick on stomach per patient   Tenormin [atenolol] Rash        Medication List      TAKE these medications    acetaminophen 500 MG tablet Commonly known as: TYLENOL Take 1,000 mg by mouth 3 (three) times daily as needed for moderate pain.   amiodarone 200 MG tablet Commonly known as: PACERONE Take 0.5 tablets (100 mg total) by mouth daily.   cephALEXin 500 MG capsule Commonly known as: KEFLEX Take 1 capsule (500 mg total) by mouth 2 (two) times daily for 5 days.   Eliquis 2.5 MG Tabs tablet Generic drug: apixaban TAKE 1 TABLET BY MOUTH 2 TIMES A DAY What changed: how much to take   furosemide 20 MG tablet Commonly known as: LASIX Take 1 tablet (20 mg total) by mouth daily. Take 20 mg daily with additional doses if weight up by 3 Lbs or shortness of breath or swelling.   ipratropium-albuterol 0.5-2.5 (3) MG/3ML Soln Commonly known as: DUONEB Take 3 mLs by nebulization every 6 (six) hours as needed (shortness of breath or wheezing).  levothyroxine 125 MCG tablet Commonly known as: SYNTHROID Take 125 mcg by mouth daily.   losartan 50 MG tablet Commonly known as: COZAAR TAKE 1 TABLET BY MOUTH EACH NIGHT AT BEDTIME What changed: See the new instructions.   metoprolol succinate 25 MG 24 hr tablet Commonly known as: TOPROL-XL TAKE 1 TABLET BY MOUTH EVERY DAY   pantoprazole 40 MG tablet Commonly known as: PROTONIX Take 40 mg by mouth daily as needed (indigestion/heartburn.).   potassium chloride 10 MEQ tablet Commonly known as: KLOR-CON M Take 2 tablets (20 mEq total) by mouth every other day. Take every time with furosemide What changed:  when to take this additional instructions               Discharge Care Instructions  (From admission, onward)           Start     Ordered   03/29/22 0000  Discharge wound care:       Comments: Clean with saline and pat dry gently. Cover with xeroform gauze then dry gauze and secure with roll gauze then tape in place.   03/29/22 0928            Allergies  Allergen Reactions   Codeine  Nausea Only   Epinephrine     Tachcardyia, chest pains tremors   Hydrocodone-Acetaminophen Other (See Comments)    Other reaction(s): sick on stomach per patient   Tenormin [Atenolol] Rash    Consultations: Cardiology  Procedures:   Discharge Exam: BP (!) 151/88 (BP Location: Left Arm)   Pulse 65   Temp 98.6 F (37 C) (Oral)   Resp 16   Ht '5\' 4"'$  (1.626 m)   Wt 56.7 kg   LMP 07/25/1988 Comment: full   SpO2 99%   BMI 21.46 kg/m  Physical Exam Constitutional:      General: She is not in acute distress.    Comments: Much more comfortable appearing today and no SOB  HENT:     Head: Normocephalic and atraumatic.     Mouth/Throat:     Mouth: Mucous membranes are moist.  Eyes:     Extraocular Movements: Extraocular movements intact.     Pupils: Pupils are equal, round, and reactive to light.  Cardiovascular:     Rate and Rhythm: Normal rate and regular rhythm.  Pulmonary:     Comments: Improved crackles  Abdominal:     General: Bowel sounds are normal. There is no distension.     Palpations: Abdomen is soft.     Tenderness: There is no abdominal tenderness.  Musculoskeletal:     Cervical back: Normal range of motion and neck supple.     Comments: Significantly improved bilateral lower extremity edema now trace to 1+. RLE wound covered with dressing  Skin:    General: Skin is warm and dry.  Neurological:     General: No focal deficit present.     Mental Status: She is alert.  Psychiatric:        Mood and Affect: Mood normal.      The results of significant diagnostics from this hospitalization (including imaging, microbiology, ancillary and laboratory) are listed below for reference.   Microbiology: Recent Results (from the past 240 hour(s))  Blood culture (routine x 2)     Status: None (Preliminary result)   Collection Time: 03/27/22  2:15 PM   Specimen: Right Antecubital; Blood  Result Value Ref Range Status   Specimen Description   Final    RIGHT  ANTECUBITAL BLOOD Performed at [facility], [place] [address], [place], [place] [address]    Special Requests   Final    BOTTLES DRAWN AEROBIC AND ANAEROBIC Blood Culture adequate volume Performed at [facility] [address], [place], [place] [address]    Culture   Final    NO GROWTH 2 DAYS Performed at [facility] [address], [place], [place] [address]    Report Status PENDING  Incomplete  Blood culture (routine x 2)     Status: None (Preliminary result)   Collection Time: 03/27/22  2:30 PM   Specimen: BLOOD RIGHT ARM  Result Value Ref Range Status   Specimen Description   Final    BLOOD RIGHT ARM Performed at [facility] [address], [place], [place] [address]    Special Requests   Final    BOTTLES DRAWN AEROBIC AND ANAEROBIC Blood Culture adequate volume Performed at [facility] [address], [place], [place] [address]    Culture   Final    NO GROWTH 2 DAYS Performed at [facility] [address] [place], [place] [address]    Report Status PENDING  Incomplete     Labs: BNP (last 3 results) Recent Labs    03/06/22 0859 03/27/22 1440 03/29/22 0438  BNP 2,638.9* 2,134.0* 3,662.9*   Basic Metabolic Panel: Recent Labs  Lab 03/27/22 1439 03/28/22 0413 03/29/22 0438  NA 143 143 141  K 4.5 2.8* 4.5  CL 106 106 105  CO2 '26 29 29  '$ GLUCOSE 116* 97 102*  BUN '21 20 20  '$ CREATININE 1.06* 0.99 1.19*  CALCIUM 8.7* 8.5* 8.2*  MG  --  1.9 2.3   Liver Function Tests: No results for input(s): "AST", "ALT", "ALKPHOS", "BILITOT", "PROT", "ALBUMIN" in the last 168 hours. No results for input(s): "LIPASE", "AMYLASE" in the last 168 hours. No results for input(s): "AMMONIA" in the last 168 hours. CBC: Recent Labs  Lab 03/27/22 1439 03/28/22 0413 03/29/22 0438  WBC 6.3 5.5 5.0  NEUTROABS 5.1 3.8 3.6  HGB 13.3 11.7* 11.9*  HCT 44.9 39.2 39.7  MCV 94.1 93.8 92.8  PLT 196 167 174    Cardiac Enzymes: No results for input(s): "CKTOTAL", "CKMB", "CKMBINDEX", "TROPONINI" in the last 168 hours. BNP: Invalid input(s): "POCBNP" CBG: No results for input(s): "GLUCAP" in the last 168 hours. D-Dimer No results for input(s): "DDIMER" in the last 72 hours. Hgb A1c No results for input(s): "HGBA1C" in the last 72 hours. Lipid Profile No results for input(s): "CHOL", "HDL", "LDLCALC", "TRIG", "CHOLHDL", "LDLDIRECT" in the last 72 hours. Thyroid function studies Recent Labs    03/28/22 0413  TSH 9.891*   Anemia work up No results for input(s): "VITAMINB12", "FOLATE", "FERRITIN", "TIBC", "IRON", "RETICCTPCT" in the last 72 hours. Urinalysis    Component Value Date/Time   COLORURINE YELLOW 03/06/2022 1147   APPEARANCEUR HAZY (A) 03/06/2022 1147   LABSPEC 1.011 03/06/2022 1147   PHURINE 6.0 03/06/2022 1147   GLUCOSEU NEGATIVE 03/06/2022 1147   HGBUR MODERATE (A) 03/06/2022 1147   BILIRUBINUR NEGATIVE 03/06/2022 1147   KETONESUR NEGATIVE 03/06/2022 1147   PROTEINUR NEGATIVE 03/06/2022 1147   NITRITE NEGATIVE 03/06/2022 1147   LEUKOCYTESUR LARGE (A) 03/06/2022 1147   Sepsis Labs Recent Labs  Lab 03/27/22 1439 03/28/22 0413 03/29/22 0438  WBC 6.3 5.5 5.0   Microbiology Recent Results (from the past 240 hour(s))  Blood culture (routine x 2)  Status: None (Preliminary result)   Collection Time: 03/27/22  2:15 PM   Specimen: Right Antecubital; Blood  Result Value Ref Range Status   Specimen Description   Final    RIGHT ANTECUBITAL BLOOD Performed at [facility] [address] [place], [place] [address]    Special Requests   Final    BOTTLES DRAWN AEROBIC AND ANAEROBIC Blood Culture adequate volume Performed at [facility] [address], [place], [place] [address]    Culture   Final    NO GROWTH 2 DAYS Performed at [facility] [address], [place], [place] [address]    Report Status PENDING  Incomplete  Blood  culture (routine x 2)     Status: None (Preliminary result)   Collection Time: 03/27/22  2:30 PM   Specimen: BLOOD RIGHT ARM  Result Value Ref Range Status   Specimen Description   Final    BLOOD RIGHT ARM Performed at [facility] [address], [place], [place] [address]    Special Requests   Final    BOTTLES DRAWN AEROBIC AND ANAEROBIC Blood Culture adequate volume Performed at [facility] [address], [place], [place] [address]    Culture   Final    NO GROWTH 2 DAYS Performed at [facility] [address], [place], [place] [address]    Report Status PENDING  Incomplete    Procedures/Studies: DG Tibia/Fibula Right  Result Date: 03/27/2022 CLINICAL DATA:  Swelling, open wounds and skin EXAM: RIGHT TIBIA AND FIBULA - 2 VIEW COMPARISON:  Ankle radiographs done on 08/26/2019 FINDINGS: There is diffuse soft tissue swelling in subcutaneous plane throughout right lower leg extending to the ankle. There is previous right knee arthroplasty. No recent fracture or dislocation is seen. There are no focal lytic lesions. Dense calcification is seen in the plantar fascia. Small plantar spur is seen in calcaneus. Degenerative changes are noted in the intertarsal joints. IMPRESSION: No recent fracture or dislocation is seen. There are no focal lytic lesions. If there is clinical suspicion for osteomyelitis, follow-up MRI may be considered. Other findings as described in the body of the report. Electronically Signed   By: Elmer Picker M.D.   On: 03/27/2022 15:48   DG Chest 2 View  Result Date: 03/27/2022 CLINICAL DATA:  Chest pain and shortness of breath. EXAM: CHEST - 2 VIEW COMPARISON:  03/06/2022 FINDINGS: The cardio pericardial silhouette is enlarged. There is pulmonary vascular congestion without overt pulmonary edema. Prominent skin fold noted over the lateral left lung. The visualized bony structures of the thorax are unremarkable. Telemetry  leads overlie the chest. IMPRESSION: Enlarged cardiopericardial silhouette with pulmonary vascular congestion. Electronically Signed   By: Misty Stanley M.D.   On: 03/27/2022 13:38   DG Chest Port 1 View  Result Date: 03/06/2022 CLINICAL DATA:  sob EXAM: PORTABLE CHEST 1 VIEW COMPARISON:  November 08, 2021 FINDINGS: Again seen is the moderate cardiomegaly with some calcifications at the arch of the aorta. Lungs remain clear. IMPRESSION: Moderate cardiomegaly.  No acute pleural or pulmonary process. Electronically Signed   By: Frazier Richards M.D.   On: 03/06/2022 09:22     Time coordinating discharge: Over 30 minutes    Dwyane Dee, MD  [facility] 03/29/2022, 2:59 PM

## 2022-03-29 NOTE — TOC Transition Note (Signed)
Transition of Care [facility]) - CM/SW Discharge Note   Patient Details  Name: Heidi Adams MRN: [id number] Date of Birth: [birth year]  [facility] CM/SW Contact:  Leeroy Cha, RN Phone Number: 03/29/2022, 10:47 AM   Clinical Narrative:    Orders for rn and pt faxed to the [place] office at [phone].  Successful tx notice rec'd. Pt dcd to home with rn and pt  Final next level of care: Home w Home Health Services Barriers to Discharge: Barriers Resolved   Patient Goals and CMS Choice Patient states their goals for this hospitalization and ongoing recovery are:: to go home CMS Medicare.gov Compare Post Acute Care list provided to:: Patient Choice offered to / list presented to : Patient  Discharge Placement                       Discharge Plan and Services   Discharge Planning Services: CM Consult Post Acute Care Choice: Home Health                    HH Arranged: Disease Management, PT, RN [facility] Agency: [place] Date [place]: 03/29/22 Time [place]: 1191 Representative spoke with at [place]: request and orders faxed to [facility] office.  Social Determinants of Health (SDOH) Interventions     Readmission Risk Interventions   No data to display

## 2022-03-29 NOTE — Discharge Instructions (Signed)
Take lasix 40 mg twice a day for 2 days then go back to previous dose of 20 mg daily.  Follow up with cardiology  Continue dressing changes until wound is healed further.

## 2022-03-29 NOTE — ED Provider Triage Note (Signed)
Emergency Medicine Provider Triage Evaluation Note  Heidi Adams , a 86 y.o. female  was evaluated in triage.  Pt complains of generalized weakness. She had a fall prior to arrival. Admits to hitting her head. She is currently on Eliquis. No LOC. Recently admitted for CHF and cellulitis.   Review of Systems  Positive: Head injury Negative: Speech changes  Physical Exam  BP 132/69 (BP Location: Right Arm)   Pulse 70   Temp (!) 100.4 F (38 C) (Oral)   Resp 15   Ht '5\' 4"'$  (1.626 m)   Wt 56.7 kg   LMP 07/25/1988 Comment: full   SpO2 96%   BMI 21.46 kg/m  Gen:   Awake, no distress   Resp:  Normal effort  MSK:   Moves extremities without difficulty  Other:  AAOx4. No facial droop. No pronator drift  Medical Decision Making  Medically screening exam initiated at 7:21 PM.  Appropriate orders placed.  Heidi Adams was informed that the remainder of the evaluation will be completed by another provider, this initial triage assessment does not replace that evaluation, and the importance of remaining in the ED until their evaluation is complete.  Called CT to have patient brought to scanner for CT head to rule out hemorrhage given fall on thinners Labs ordered. Patient febrile, infectious labs.    Suzy Bouchard, [place] 03/29/22 1923

## 2022-03-30 ENCOUNTER — Ambulatory Visit: Payer: PPO | Admitting: Cardiology

## 2022-03-30 DIAGNOSIS — R5381 Other malaise: Secondary | ICD-10-CM | POA: Diagnosis not present

## 2022-03-30 LAB — CBC
HCT: 37.4 % (ref 36.0–46.0)
Hemoglobin: 11.3 g/dL — ABNORMAL LOW (ref 12.0–15.0)
MCH: 28 pg (ref 26.0–34.0)
MCHC: 30.2 g/dL (ref 30.0–36.0)
MCV: 92.8 fL (ref 80.0–100.0)
Platelets: 147 10*3/uL — ABNORMAL LOW (ref 150–400)
RBC: 4.03 MIL/uL (ref 3.87–5.11)
RDW: 21.5 % — ABNORMAL HIGH (ref 11.5–15.5)
WBC: 5.4 10*3/uL (ref 4.0–10.5)
nRBC: 0 % (ref 0.0–0.2)

## 2022-03-30 LAB — T3: T3, Total: 29 ng/dL — ABNORMAL LOW (ref 71–180)

## 2022-03-30 LAB — BASIC METABOLIC PANEL
Anion gap: 6 (ref 5–15)
BUN: 25 mg/dL — ABNORMAL HIGH (ref 8–23)
CO2: 29 mmol/L (ref 22–32)
Calcium: 8.3 mg/dL — ABNORMAL LOW (ref 8.9–10.3)
Chloride: 104 mmol/L (ref 98–111)
Creatinine, Ser: 1.06 mg/dL — ABNORMAL HIGH (ref 0.44–1.00)
GFR, Estimated: 52 mL/min — ABNORMAL LOW (ref >60)
Glucose, Bld: 93 mg/dL (ref 70–99)
Potassium: 4 mmol/L (ref 3.5–5.1)
Sodium: 139 mmol/L (ref 135–145)

## 2022-03-30 MED ORDER — FUROSEMIDE 40 MG PO TABS
40.0000 mg | ORAL_TABLET | Freq: Every day | ORAL | Status: DC
  Administered 2022-04-01: 40 mg via ORAL
  Filled 2022-03-30 (×2): qty 1

## 2022-03-30 MED ORDER — LOSARTAN POTASSIUM 25 MG PO TABS
25.0000 mg | ORAL_TABLET | Freq: Every day | ORAL | Status: DC
  Administered 2022-03-30 – 2022-04-01 (×3): 25 mg via ORAL
  Filled 2022-03-30 (×3): qty 1

## 2022-03-30 NOTE — TOC Initial Note (Signed)
Transition of Care [facility]) - Initial/Assessment Note   Patient Details  Name: Heidi Adams MRN: [id number] Date of Birth: [birth year]  Transition of Care [facility]) CM/SW Contact:    Sherie Don, LCSW Phone Number: 03/30/2022, 1:24 PM  Clinical Narrative: PT evaluation recommended SNF. CSW spoke with patient to discuss recommendations. Patient is agreeable to SNF and reported family has been trying to get her into [facility] in [place] prior to her admission. Patient provided permission for CSW to follow up with son, Mackinley Cassaday ([phone]), to keep him updated.  FL2 done; PASRR received. Initial referral faxed out. [facility] made a bed offer. CSW called [facility] and spoke with Marlowe Kays to start insurance authorization for SNF and EMS transport. Insurance authorization is pending. CSW updated son. TOC to follow.  Expected Discharge Plan: Skilled Nursing Facility Barriers to Discharge: SNF Pending bed offer, Insurance Authorization  Patient Goals and CMS Choice Patient states their goals for this hospitalization and ongoing recovery are:: Go to short-term rehab CMS Medicare.gov Compare Post Acute Care list provided to:: Patient Choice offered to / list presented to : Patient, Adult Children  Expected Discharge Plan and Services Expected Discharge Plan: [place] In-house Referral: Clinical Social Work Post Acute Care Choice: [place] Living arrangements for the past 2 months: [place]            DME Arranged: N/A DME Agency: NA  Prior Living Arrangements/Services Living arrangements for the past 2 months: [place] Patient language and need for interpreter reviewed:: Yes Do you feel safe going back to the place where you live?: Yes      Need for Family Participation in Patient Care: No (Comment) Care giver support system in place?: Yes (comment) Criminal Activity/Legal Involvement Pertinent to Current Situation/Hospitalization: No -  Comment as needed  Activities of Daily Living Home Assistive Devices/Equipment: Walker (specify type), Bedside commode/3-in-1, Cane (specify quad or straight) ADL Screening (condition at time of admission) Patient's cognitive ability adequate to safely complete daily activities?: Yes Is the patient deaf or have difficulty hearing?: No Does the patient have difficulty seeing, even when wearing glasses/contacts?: No Does the patient have difficulty concentrating, remembering, or making decisions?: No Patient able to express need for assistance with ADLs?: Yes Does the patient have difficulty dressing or bathing?: Yes Independently performs ADLs?: No Communication: Independent Dressing (OT): Needs assistance Is this a change from baseline?: Pre-admission baseline Grooming: Needs assistance Is this a change from baseline?: Pre-admission baseline Feeding: Independent Bathing: Needs assistance Is this a change from baseline?: Pre-admission baseline Toileting: Needs assistance Is this a change from baseline?: Pre-admission baseline In/Out Bed: Needs assistance Is this a change from baseline?: Pre-admission baseline Walks in Home: Needs assistance Is this a change from baseline?: Pre-admission baseline Does the patient have difficulty walking or climbing stairs?: Yes Weakness of Legs: Both Weakness of Arms/Hands: None  Permission Sought/Granted Permission sought to share information with : Facility Art therapist granted to share information with : Yes, Verbal Permission Granted Permission granted to share info w AGENCY: SNFs  Emotional Assessment Attitude/Demeanor/Rapport: Engaged Affect (typically observed): Accepting, Appropriate Orientation: : Oriented to Self, Oriented to Place, Oriented to  Time, Oriented to Situation Psych Involvement: No (comment)  Admission diagnosis:  Debility [R53.81] Patient Active Problem List   Diagnosis Date Noted   Debility  03/29/2022   Fever 03/29/2022   Stage 3a chronic kidney disease (CKD) ([place]) 03/29/2022   Cellulitis 03/27/2022   HTN (  hypertension) 03/27/2022   Acute on chronic combined systolic and diastolic CHF (congestive heart failure) ([place]) 07/12/2021   Hypocalcemia 07/12/2021   Normocytic anemia 07/12/2021   GERD (gastroesophageal reflux disease) 07/12/2021   Contusion of left hip region 05/26/2021   Vertigo 04/25/2021   Nonischemic cardiomyopathy ([place]) 02/24/2021   Orthostatic hypotension 02/24/2021   Family history of colonic polyps 10/12/2020   Gastroesophageal reflux disease with esophagitis and hemorrhage 10/12/2020   Slow transit constipation 10/12/2020   Iron deficiency anemia due to chronic blood loss 06/24/2020   A-fib ([place]) 03/23/2020   Atrial fibrillation with RVR ([place]) 03/22/2020   History of congestive heart failure 03/22/2020   Swelling of right foot 10/25/2019   Dizziness 06/22/2019   Arthritis of wrist 81/15/7262   Chronic systolic heart failure ([place]) 09/22/2018   DDD (degenerative disc disease), cervical 08/20/2018   Left elbow pain 08/02/2018   TIA (transient ischemic attack) 07/30/2018   Hypertensive urgency 07/30/2018   AF (paroxysmal atrial fibrillation) ([place]) 07/30/2018   Arthritis of carpometacarpal ([place]) joint of left thumb 06/20/2018   Pain in thumb joint with movement of left hand 05/08/2018   Pain in finger of right hand 01/02/2018   Conductive hearing loss, bilateral 10/19/2017   Dry nose 10/19/2017   Chronic hip pain after total replacement of left hip joint 08/25/2017   Trochanteric bursitis of left hip 08/25/2017   Localized, primary osteoarthritis of elbow 08/22/2017   Synovitis and tenosynovitis 08/11/2017   Malignant neoplasm of upper-inner quadrant of left breast in female, estrogen receptor positive ([place]) 06/20/2017   S/P closed reduction of dislocated total hip prosthesis 02/03/2017   S/P left THA, AA 01/10/2017   Fatigue 05/31/2016   Overweight  (BMI 25.0-29.9) 05/11/2016   S/P right THA, AA 05/10/2016   Pes planus 03/16/2016   Abnormality of gait 03/16/2016   PAC (premature atrial contraction) 02/24/2016   PVC (premature ventricular contraction) 02/24/2016   Palpitations 01/27/2016   Benign paroxysmal positional vertigo of right ear 12/15/2015   Pharyngoesophageal dysphagia 12/15/2015   Hypothyroidism 11/26/2015   Supine hypertension 11/26/2015   S/P left TKA 03/55/9741   Lichen sclerosus 63/84/5364   PCP:  [name], MD Pharmacy:   [facility], [place] - [address] [place] [address] Phone: [phone] Fax: [phone]  Readmission Risk Interventions     No data to display

## 2022-03-30 NOTE — Evaluation (Signed)
Physical Therapy Evaluation Patient Details Name: Heidi Adams MRN: [id number] DOB: 1935-12-14 Today's Date: 03/30/2022  History of Present Illness  86 yo female admitted with debility, weakness. Multiple falls at home after d/c home on 03/29/22. Hx of CHF, Afib, CKD, cellulitis, breast ca, PVCs, skin cancer, TKA, THA  Clinical Impression  On eval, pt required Mod A for bed mobility and Min A for transfers and ambulation. She walked ~15 feet with a RW. Pt presents with general weakness, decreased activity tolerance, and impaired gait and balance. Discussed d/c plan-pt is agreeable to ST SNF rehab. Pt reports she fell and husband fell on top of her while trying to climb stairs to enter home. Will plan to follow patient during this hospital stay.        Recommendations for follow up therapy are one component of a multi-disciplinary discharge planning process, led by the attending physician.  Recommendations may be updated based on patient status, additional functional criteria and insurance authorization.  Follow Up Recommendations Skilled nursing-short term rehab (<3 hours/day)      Assistance Recommended at Discharge Frequent or constant Supervision/Assistance  Patient can return home with the following  A little help with walking and/or transfers;A little help with bathing/dressing/bathroom;Assist for transportation;Assistance with cooking/housework;Help with stairs or ramp for entrance    Equipment Recommendations None recommended by PT  Recommendations for Other Services       Functional Status Assessment Patient has had a recent decline in their functional status and demonstrates the ability to make significant improvements in function in a reasonable and predictable amount of time.     Precautions / Restrictions Precautions Precautions: Fall Precaution Comments: pt reports multiple fall history Restrictions Weight Bearing Restrictions: No      Mobility  Bed Mobility Overal  bed mobility: Needs Assistance Bed Mobility: Supine to Sit     Supine to sit: Mod assist, HOB elevated     General bed mobility comments: Assist for trunk and to scoot to EOB. Increased time. Cues for technique. Pt relied on bedrail.    Transfers Overall transfer level: Needs assistance Equipment used: Rolling walker (2 wheels) Transfers: Sit to/from Stand Sit to Stand: Min assist, From elevated surface           General transfer comment: Assist to power up, stabilize, control descent. Cues for safety, technique, hand placement. Increased time.    Ambulation/Gait Ambulation/Gait assistance: Min assist Gait Distance (Feet): 15 Feet Assistive device: Rolling walker (2 wheels) Gait Pattern/deviations: Step-through pattern, Decreased stride length, Decreased step length - right, Decreased step length - left       General Gait Details: Assist to stabilize pt throughout distance. Distance limited by weakness, fatigue. Pt reported knees feeling weak.  Stairs            Wheelchair Mobility    Modified Rankin (Stroke Patients Only)       Balance Overall balance assessment: Needs assistance, History of Falls         Standing balance support: Bilateral upper extremity supported, Reliant on assistive device for balance, During functional activity Standing balance-Leahy Scale: Poor                               Pertinent Vitals/Pain Pain Assessment Pain Assessment: Faces Faces Pain Scale: Hurts a little bit Pain Location: generalized-no new pains per pt Pain Descriptors / Indicators: Discomfort, Sore Pain Intervention(s): Limited activity within patient's tolerance, Monitored during session,  Repositioned    Home Living Family/patient expects to be discharged to:: Skilled nursing facility Living Arrangements: Spouse/significant other Available Help at Discharge: Family;Available 24 hours/day Type of Home: House Home Access: Stairs to  enter Entrance Stairs-Rails: None Entrance Stairs-Number of Steps: 2   Home Layout: One level Home Equipment: Conservation officer, nature (2 wheels);Shower seat;Cane - single point Additional Comments: pt reports potential use of built in chair in the shower however has a concern for slipping on chair    Prior Function Prior Level of Function : Independent/Modified Independent             Mobility Comments: use of RW PRN in home and community distances ADLs Comments: assit as needed with ADL/IADL from husband (socks/shoes, washing back in the shower, cooking/cleaning)     Hand Dominance        Extremity/Trunk Assessment   Upper Extremity Assessment Upper Extremity Assessment: Generalized weakness    Lower Extremity Assessment Lower Extremity Assessment: Generalized weakness    Cervical / Trunk Assessment Cervical / Trunk Assessment: Kyphotic  Communication   Communication: No difficulties  Cognition Arousal/Alertness: Awake/alert Behavior During Therapy: WFL for tasks assessed/performed Overall Cognitive Status: Within Functional Limits for tasks assessed                                          General Comments      Exercises     Assessment/Plan    PT Assessment Patient needs continued PT services  PT Problem List Decreased strength;Decreased mobility;Decreased range of motion;Decreased activity tolerance;Decreased balance;Decreased knowledge of use of DME       PT Treatment Interventions DME instruction;Gait training;Therapeutic activities;Therapeutic exercise;Patient/family education;Balance training;Functional mobility training    PT Goals (Current goals can be found in the Care Plan section)  Acute Rehab PT Goals Patient Stated Goal: pt is agreeable to st rehab PT Goal Formulation: With patient Time For Goal Achievement: 04/13/22 Potential to Achieve Goals: Good    Frequency Min 2X/week     Co-evaluation               AM-PAC PT "6  Clicks" Mobility  Outcome Measure Help needed turning from your back to your side while in a flat bed without using bedrails?: A Little Help needed moving from lying on your back to sitting on the side of a flat bed without using bedrails?: A Lot Help needed moving to and from a bed to a chair (including a wheelchair)?: A Little Help needed standing up from a chair using your arms (e.g., wheelchair or bedside chair)?: A Little Help needed to walk in hospital room?: A Little Help needed climbing 3-5 steps with a railing? : Total 6 Click Score: 15    End of Session Equipment Utilized During Treatment: Gait belt Activity Tolerance: Patient limited by fatigue;Patient tolerated treatment well Patient left: in chair;with call bell/phone within reach;with chair alarm set        Time: 8185-6314 PT Time Calculation (min) (ACUTE ONLY): 20 min   Charges:   PT Evaluation $PT Eval Moderate Complexity: 1 Mod             Doreatha Massed, PT Acute Rehabilitation  Office: [phone] Pager: [phone]

## 2022-03-30 NOTE — [place] FL2 (Signed)
[place] MEDICAID FL2 LEVEL OF CARE SCREENING TOOL     IDENTIFICATION  Patient Name: Heidi Adams Birthdate: [birth year] Sex: female Admission Date (Current Location): 03/29/2022  [facility] and [place] Number:  Herbalist and Address:  [facility],  [place], [place]      Provider Number: [id number]  Attending Physician Name and Address:  Shelly Coss, MD  Relative Name and Phone Number:  Catilyn Boggus (son) Ph: [phone]    Current Level of Care: Hospital Recommended Level of Care: [place] Prior Approval Number:    Date Approved/Denied:   PASRR Number: [id number]  Discharge Plan: SNF    Current Diagnoses: Patient Active Problem List   Diagnosis Date Noted   Debility 03/29/2022   Fever 03/29/2022   Stage 3a chronic kidney disease (CKD) ([place]) 03/29/2022   Cellulitis 03/27/2022   HTN (hypertension) 03/27/2022   Acute on chronic combined systolic and diastolic CHF (congestive heart failure) ([place]) 07/12/2021   Hypocalcemia 07/12/2021   Normocytic anemia 07/12/2021   GERD (gastroesophageal reflux disease) 07/12/2021   Contusion of left hip region 05/26/2021   Vertigo 04/25/2021   Nonischemic cardiomyopathy ([place]) 02/24/2021   Orthostatic hypotension 02/24/2021   Family history of colonic polyps 10/12/2020   Gastroesophageal reflux disease with esophagitis and hemorrhage 10/12/2020   Slow transit constipation 10/12/2020   Iron deficiency anemia due to chronic blood loss 06/24/2020   A-fib ([place]) 03/23/2020   Atrial fibrillation with RVR ([place]) 03/22/2020   History of congestive heart failure 03/22/2020   Swelling of right foot 10/25/2019   Dizziness 06/22/2019   Arthritis of wrist 29/51/8841   Chronic systolic heart failure ([place]) 09/22/2018   DDD (degenerative disc disease), cervical 08/20/2018   Left elbow pain 08/02/2018   TIA (transient ischemic attack) 07/30/2018   Hypertensive urgency 07/30/2018   AF  (paroxysmal atrial fibrillation) ([place]) 07/30/2018   Arthritis of carpometacarpal ([place]) joint of left thumb 06/20/2018   Pain in thumb joint with movement of left hand 05/08/2018   Pain in finger of right hand 01/02/2018   Conductive hearing loss, bilateral 10/19/2017   Dry nose 10/19/2017   Chronic hip pain after total replacement of left hip joint 08/25/2017   Trochanteric bursitis of left hip 08/25/2017   Localized, primary osteoarthritis of elbow 08/22/2017   Synovitis and tenosynovitis 08/11/2017   Malignant neoplasm of upper-inner quadrant of left breast in female, estrogen receptor positive ([place]) 06/20/2017   S/P closed reduction of dislocated total hip prosthesis 02/03/2017   S/P left THA, AA 01/10/2017   Fatigue 05/31/2016   Overweight (BMI 25.0-29.9) 05/11/2016   S/P right THA, AA 05/10/2016   Pes planus 03/16/2016   Abnormality of gait 03/16/2016   PAC (premature atrial contraction) 02/24/2016   PVC (premature ventricular contraction) 02/24/2016   Palpitations 01/27/2016   Benign paroxysmal positional vertigo of right ear 12/15/2015   Pharyngoesophageal dysphagia 12/15/2015   Hypothyroidism 11/26/2015   Supine hypertension 11/26/2015   S/P left TKA 66/12/3014   Lichen sclerosus 08/02/3233    Orientation RESPIRATION BLADDER Height & Weight     Self, Time, Situation, Place  Normal Incontinent Weight: 120 lb 9.5 oz (54.7 kg) Height:  '5\' 4"'$  (162.6 cm)  BEHAVIORAL SYMPTOMS/MOOD NEUROLOGICAL BOWEL NUTRITION STATUS   (N/A)  (N/A) Continent Diet (Heart healthy diet)  AMBULATORY STATUS COMMUNICATION OF NEEDS Skin   Limited Assist Verbally Other (Comment), Skin abrasions (Rash: lower back; Erythema: right leg; Ecchymosis: bilateral arms; Abrasion: bilateral legs)  Personal Care Assistance Level of Assistance  Bathing, Feeding, Dressing Bathing Assistance: Limited assistance Feeding assistance: Independent Dressing Assistance: Limited assistance      Functional Limitations Info  Sight, Hearing, Speech Sight Info: Impaired Hearing Info: Adequate Speech Info: Adequate    SPECIAL CARE FACTORS FREQUENCY  PT (By licensed PT), OT (By licensed OT)     PT Frequency: 5x's/week OT Frequency: 5x's/week            Contractures Contractures Info: Not present    Additional Factors Info  Code Status, Allergies Code Status Info: DNR Allergies Info: Codeine, Epinephrine, Hydrocodone-acetaminophen, Tenormin (Atenolol)           Current Medications (03/30/2022):  This is the current hospital active medication list Current Facility-Administered Medications  Medication Dose Route Frequency Provider Last Rate Last Admin   acetaminophen (TYLENOL) tablet 650 mg  650 mg Oral Q6H PRN Opyd, Ilene Qua, MD   650 mg at 03/30/22 1241   Or   acetaminophen (TYLENOL) suppository 650 mg  650 mg Rectal Q6H PRN Opyd, Ilene Qua, MD       amiodarone (PACERONE) tablet 100 mg  100 mg Oral Daily Opyd, Ilene Qua, MD   100 mg at 03/30/22 1007   apixaban (ELIQUIS) tablet 2.5 mg  2.5 mg Oral BID Opyd, Ilene Qua, MD   2.5 mg at 03/30/22 1008   cephALEXin (KEFLEX) capsule 500 mg  500 mg Oral BID Opyd, Ilene Qua, MD   500 mg at 03/30/22 1009   furosemide (LASIX) tablet 40 mg  40 mg Oral BID Opyd, Ilene Qua, MD   40 mg at 03/30/22 1009   [START ON 04/01/2022] furosemide (LASIX) tablet 40 mg  40 mg Oral Daily Adhikari, Amrit, MD       ipratropium-albuterol (DUONEB) 0.5-2.5 (3) MG/3ML nebulizer solution 3 mL  3 mL Nebulization Q6H PRN Opyd, Ilene Qua, MD       levothyroxine (SYNTHROID) tablet 125 mcg  125 mcg Oral Q0600 Vianne Bulls, MD   125 mcg at 03/30/22 0549   losartan (COZAAR) tablet 25 mg  25 mg Oral Daily Shelly Coss, MD   25 mg at 03/30/22 1009   metoprolol succinate (TOPROL-XL) 24 hr tablet 25 mg  25 mg Oral Daily Opyd, Ilene Qua, MD   25 mg at 03/30/22 1009   senna-docusate (Senokot-S) tablet 1 tablet  1 tablet Oral QHS PRN Opyd, Ilene Qua, MD          Discharge Medications: Please see discharge summary for a list of discharge medications.  Relevant Imaging Results:  Relevant Lab Results:   Additional Information SSN: [id number]  Sherie Don, LCSW

## 2022-03-30 NOTE — Plan of Care (Signed)
  Problem: Education: Goal: Knowledge of General Education information will improve Description Including pain rating scale, medication(s)/side effects and non-pharmacologic comfort measures Outcome: Progressing   

## 2022-03-30 NOTE — ED Provider Notes (Signed)
[facility] Provider Note  CSN: [id number] Arrival date & time: 03/29/22 1909  Chief Complaint(s) Fall  HPI Heidi Adams is a 86 y.o. female with PMH CHF, HTN, hypothyroidism who presents emergency department for evaluation of multiple falls.  Patient was recently discharged this morning after hospital admission for CHF exacerbation and diuresis.  Patient was discharged home but unfortunately suffered multiple falls up a staircase.  Family is primarily concerned that she may need skilled nursing facility placement as she lives with her elderly husband who is unable to care for her at this time.  She is on a blood thinner but is currently denying numbness, tingling, weakness or other neurologic complaints.  Patient arrives with bandages over bilateral forearms where she suffered skin tears from the fall.  She denies pain to these areas.   Past Medical History Past Medical History:  Diagnosis Date   Anemia    hx of anemia   Arthritis    Breast cancer ([place])    Left breast   Chronic combined systolic and diastolic CHF (congestive heart failure) ([place]) 09/22/2018   History of skin cancer    Hypertension    Hypothyroidism    PVC (premature ventricular contraction)    Patient Active Problem List   Diagnosis Date Noted   Debility 03/29/2022   Fever 03/29/2022   Stage 3a chronic kidney disease (CKD) ([place]) 03/29/2022   Cellulitis 03/27/2022   HTN (hypertension) 03/27/2022   Acute on chronic combined systolic and diastolic CHF (congestive heart failure) ([place]) 07/12/2021   Hypocalcemia 07/12/2021   Normocytic anemia 07/12/2021   GERD (gastroesophageal reflux disease) 07/12/2021   Contusion of left hip region 05/26/2021   Vertigo 04/25/2021   Nonischemic cardiomyopathy ([place]) 02/24/2021   Orthostatic hypotension 02/24/2021   Family history of colonic polyps 10/12/2020   Gastroesophageal reflux disease with esophagitis and hemorrhage 10/12/2020   Slow transit  constipation 10/12/2020   Iron deficiency anemia due to chronic blood loss 06/24/2020   A-fib ([place]) 03/23/2020   Atrial fibrillation with RVR ([place]) 03/22/2020   History of congestive heart failure 03/22/2020   Swelling of right foot 10/25/2019   Dizziness 06/22/2019   Arthritis of wrist 35/45/6256   Chronic systolic heart failure ([place]) 09/22/2018   DDD (degenerative disc disease), cervical 08/20/2018   Left elbow pain 08/02/2018   TIA (transient ischemic attack) 07/30/2018   Hypertensive urgency 07/30/2018   AF (paroxysmal atrial fibrillation) ([place]) 07/30/2018   Arthritis of carpometacarpal ([place]) joint of left thumb 06/20/2018   Pain in thumb joint with movement of left hand 05/08/2018   Pain in finger of right hand 01/02/2018   Conductive hearing loss, bilateral 10/19/2017   Dry nose 10/19/2017   Chronic hip pain after total replacement of left hip joint 08/25/2017   Trochanteric bursitis of left hip 08/25/2017   Localized, primary osteoarthritis of elbow 08/22/2017   Synovitis and tenosynovitis 08/11/2017   Malignant neoplasm of upper-inner quadrant of left breast in female, estrogen receptor positive ([place]) 06/20/2017   S/P closed reduction of dislocated total hip prosthesis 02/03/2017   S/P left THA, AA 01/10/2017   Fatigue 05/31/2016   Overweight (BMI 25.0-29.9) 05/11/2016   S/P right THA, AA 05/10/2016   Pes planus 03/16/2016   Abnormality of gait 03/16/2016   PAC (premature atrial contraction) 02/24/2016   PVC (premature ventricular contraction) 02/24/2016   Palpitations 01/27/2016   Benign paroxysmal positional vertigo of right ear 12/15/2015   Pharyngoesophageal dysphagia 12/15/2015   Hypothyroidism 11/26/2015  Supine hypertension 11/26/2015   S/P left TKA 57/84/6962   Lichen sclerosus 95/28/4132   Home Medication(s) Prior to Admission medications   Medication Sig Start Date End Date Taking? Authorizing Provider  potassium chloride (KLOR-CON) 10 MEQ tablet Take 2  tablets (20 mEq total) by mouth every other day. Take every time with furosemide Patient taking differently: Take 20 mEq by mouth daily. 02/24/21  Yes Adrian Prows, MD  acetaminophen (TYLENOL) 500 MG tablet Take 1,000 mg by mouth 3 (three) times daily as needed for moderate pain.     [provider]  amiodarone (PACERONE) 200 MG tablet Take 0.5 tablets (100 mg total) by mouth daily. 09/06/21   Adrian Prows, MD  cephALEXin (KEFLEX) 500 MG capsule Take 1 capsule (500 mg total) by mouth 2 (two) times daily for 5 days. 03/29/22 04/03/22  Dwyane Dee, MD  ELIQUIS 2.5 MG TABS tablet TAKE 1 TABLET BY MOUTH 2 TIMES A DAY Patient taking differently: Take 2.5 mg by mouth 2 (two) times daily. 01/26/22   Adrian Prows, MD  furosemide (LASIX) 20 MG tablet Take 1 tablet (20 mg total) by mouth daily. Take 20 mg daily with additional doses if weight up by 3 Lbs or shortness of breath or swelling. 12/14/21   Cantwell, Celeste C, PA-C  ipratropium-albuterol (DUONEB) 0.5-2.5 (3) MG/3ML SOLN Take 3 mLs by nebulization every 6 (six) hours as needed (shortness of breath or wheezing). 07/15/21   Arrien, Jimmy Picket, MD  levothyroxine (SYNTHROID) 125 MCG tablet Take 125 mcg by mouth daily. 10/27/21   [provider]  losartan (COZAAR) 50 MG tablet TAKE 1 TABLET BY MOUTH EACH NIGHT AT BEDTIME Patient taking differently: Take 25 mg by mouth daily. 02/08/22   Adrian Prows, MD  metoprolol succinate (TOPROL-XL) 25 MG 24 hr tablet TAKE 1 TABLET BY MOUTH EVERY DAY Patient taking differently: Take 25 mg by mouth 2 (two) times daily. 01/31/22   Adrian Prows, MD  pantoprazole (PROTONIX) 40 MG tablet Take 40 mg by mouth daily as needed (indigestion/heartburn.).  01/20/16   [provider]                                                                                                                                    Past Surgical History Past Surgical History:  Procedure Laterality Date   ABDOMINAL HYSTERECTOMY  89   TAH  BSO   APPENDECTOMY     BREAST LUMPECTOMY Left 05/30/2017   BREAST LUMPECTOMY WITH RADIOACTIVE SEED LOCALIZATION Left 05/30/2017   Procedure: LEFT BREAST LUMPECTOMY WITH RADIOACTIVE SEED LOCALIZATION;  Surgeon: Excell Seltzer, MD;  Location: [facility];  Service: General;  Laterality: Left;   CATARACT EXTRACTION  09/10/2018   Right eye   CHOLECYSTECTOMY     dr Georgia Lopes   ELBOW SURGERY     EYE SURGERY     "on my eyelids" years ago   KNEE ARTHROSCOPY  left   THYROIDECTOMY  1973   TONSILLECTOMY     TOTAL HIP ARTHROPLASTY Right 05/10/2016   Procedure: TOTAL HIP ARTHROPLASTY ANTERIOR APPROACH;  Surgeon: Paralee Cancel, MD;  Location: [facility];  Service: Orthopedics;  Laterality: Right;   TOTAL HIP ARTHROPLASTY Left 01/10/2017   Procedure: LEFT TOTAL HIP ARTHROPLASTY ANTERIOR APPROACH;  Surgeon: Paralee Cancel, MD;  Location: [facility];  Service: Orthopedics;  Laterality: Left;   TOTAL KNEE ARTHROPLASTY Left 09/28/2015   Procedure: LEFT TOTAL KNEE ARTHROPLASTY;  Surgeon: Paralee Cancel, MD;  Location: [facility];  Service: Orthopedics;  Laterality: Left;   TOTAL KNEE ARTHROPLASTY Right 08/09/2016   Procedure: RIGHT TOTAL KNEE ARTHROPLASTY;  Surgeon: Paralee Cancel, MD;  Location: [facility];  Service: Orthopedics;  Laterality: Right;  Adductor Block   Total Left hip arthroplasty     01/10/17 Dr. Alvan Dame   Family History Family History  Problem Relation Age of Onset   Diabetes Father    Hypertension Father    Stroke Father    Pneumonia Sister    Cancer Brother    Other Brother        Brain tumor   Breast cancer Cousin     Social History Social History   Tobacco Use   Smoking status: Former    Packs/day: 1.00    Years: 20.00    Total pack years: 20.00    Types: Cigarettes    Quit date: 07/26/1987    Years since quitting: 34.7   Smokeless tobacco: Never  Vaping Use   Vaping Use: Never used  Substance Use Topics   Alcohol use: No    Alcohol/week: 0.0 standard drinks of alcohol    Drug use: No   Allergies Codeine, Epinephrine, Hydrocodone-acetaminophen, and Tenormin [atenolol]  Review of Systems Review of Systems  Skin:  Positive for wound.    Physical Exam Vital Signs  I have reviewed the triage vital signs BP (!) 145/71 (BP Location: Left Arm)   Pulse 64   Temp 98.2 F (36.8 C) (Oral)   Resp 18   Ht '5\' 4"'$  (1.626 m)   Wt 54.7 kg   LMP 07/25/1988 Comment: full   SpO2 95%   BMI 20.70 kg/m   Physical Exam Vitals and nursing note reviewed.  Constitutional:      General: She is not in acute distress.    Appearance: She is well-developed.  HENT:     Head: Normocephalic and atraumatic.  Eyes:     Conjunctiva/sclera: Conjunctivae normal.  Cardiovascular:     Rate and Rhythm: Normal rate and regular rhythm.     Heart sounds: No murmur heard. Pulmonary:     Effort: Pulmonary effort is normal. No respiratory distress.     Breath sounds: Normal breath sounds.  Abdominal:     Palpations: Abdomen is soft.     Tenderness: There is no abdominal tenderness.  Musculoskeletal:        General: No swelling.     Cervical back: Neck supple.  Skin:    General: Skin is warm and dry.     Capillary Refill: Capillary refill takes less than 2 seconds.     Findings: Bruising and lesion present.  Neurological:     Mental Status: She is alert.  Psychiatric:        Mood and Affect: Mood normal.     ED Results and Treatments Labs (all labs ordered are listed, but only abnormal results are displayed) Labs Reviewed  CBC WITH DIFFERENTIAL/PLATELET - Abnormal; Notable for  the following components:      Result Value   Hemoglobin 11.8 (*)    RDW 21.8 (*)    All other components within normal limits  BASIC METABOLIC PANEL - Abnormal; Notable for the following components:   Glucose, Bld 105 (*)    BUN 25 (*)    Creatinine, Ser 1.23 (*)    Calcium 8.3 (*)    GFR, Estimated 44 (*)    All other components within normal limits  URINALYSIS, ROUTINE W REFLEX  MICROSCOPIC - Abnormal; Notable for the following components:   Hgb urine dipstick SMALL (*)    All other components within normal limits  BASIC METABOLIC PANEL - Abnormal; Notable for the following components:   BUN 25 (*)    Creatinine, Ser 1.06 (*)    Calcium 8.3 (*)    GFR, Estimated 52 (*)    All other components within normal limits  CBC - Abnormal; Notable for the following components:   Hemoglobin 11.3 (*)    RDW 21.5 (*)    Platelets 147 (*)    All other components within normal limits  URINE CULTURE  LACTIC ACID, PLASMA                                                                                                                          Radiology CT Head Wo Contrast  Result Date: 03/29/2022 CLINICAL DATA:  Headache.  Fall. EXAM: CT HEAD WITHOUT CONTRAST CT CERVICAL SPINE WITHOUT CONTRAST TECHNIQUE: Multidetector CT imaging of the head and cervical spine was performed following the standard protocol without intravenous contrast. Multiplanar CT image reconstructions of the cervical spine were also generated. RADIATION DOSE REDUCTION: This exam was performed according to the departmental dose-optimization program which includes automated exposure control, adjustment of the mA and/or kV according to patient size and/or use of iterative reconstruction technique. COMPARISON:  Head CT dated 02/14/2022. FINDINGS: CT HEAD FINDINGS Brain: Mild age-related atrophy and chronic microvascular ischemic changes. There is no acute intracranial hemorrhage. No mass effect or midline shift. No extra-axial fluid collection. Small partially calcified left frontal meningioma. Vascular: No hyperdense vessel or unexpected calcification. Skull: Normal. Negative for fracture or focal lesion. Sinuses/Orbits: No acute finding. Other: None CT CERVICAL SPINE FINDINGS Alignment: No acute subluxation. Skull base and vertebrae: No acute fracture.  Osteopenia. Soft tissues and spinal canal: No prevertebral fluid or  swelling. No visible canal hematoma. Disc levels:  No acute findings.  Degenerative changes. Upper chest: Negative. Other: Bilateral carotid bulb calcified plaques. IMPRESSION: 1. No acute intracranial pathology. Mild age-related atrophy and chronic microvascular ischemic changes. 2. No acute cervical spine fracture or subluxation. Electronically Signed   By: Anner Crete M.D.   On: 03/29/2022 20:09   CT Cervical Spine Wo Contrast  Result Date: 03/29/2022 CLINICAL DATA:  Headache.  Fall. EXAM: CT HEAD WITHOUT CONTRAST CT CERVICAL SPINE WITHOUT CONTRAST TECHNIQUE: Multidetector CT imaging of the head and cervical spine was performed following the standard protocol  without intravenous contrast. Multiplanar CT image reconstructions of the cervical spine were also generated. RADIATION DOSE REDUCTION: This exam was performed according to the departmental dose-optimization program which includes automated exposure control, adjustment of the mA and/or kV according to patient size and/or use of iterative reconstruction technique. COMPARISON:  Head CT dated 02/14/2022. FINDINGS: CT HEAD FINDINGS Brain: Mild age-related atrophy and chronic microvascular ischemic changes. There is no acute intracranial hemorrhage. No mass effect or midline shift. No extra-axial fluid collection. Small partially calcified left frontal meningioma. Vascular: No hyperdense vessel or unexpected calcification. Skull: Normal. Negative for fracture or focal lesion. Sinuses/Orbits: No acute finding. Other: None CT CERVICAL SPINE FINDINGS Alignment: No acute subluxation. Skull base and vertebrae: No acute fracture.  Osteopenia. Soft tissues and spinal canal: No prevertebral fluid or swelling. No visible canal hematoma. Disc levels:  No acute findings.  Degenerative changes. Upper chest: Negative. Other: Bilateral carotid bulb calcified plaques. IMPRESSION: 1. No acute intracranial pathology. Mild age-related atrophy and chronic microvascular  ischemic changes. 2. No acute cervical spine fracture or subluxation. Electronically Signed   By: Anner Crete M.D.   On: 03/29/2022 20:09   DG Chest 1 View  Result Date: 03/29/2022 CLINICAL DATA:  Fever EXAM: CHEST  1 VIEW COMPARISON:  03/27/2022 FINDINGS: Cardiomegaly. No acute airspace disease or effusion. Aortic atherosclerosis. No pneumothorax. IMPRESSION: No active disease.  Cardiomegaly. Electronically Signed   By: Donavan Foil M.D.   On: 03/29/2022 19:45    Pertinent labs & imaging results that were available during my care of the patient were reviewed by me and considered in my medical decision making (see MDM for details).  Medications Ordered in ED Medications  cephALEXin (KEFLEX) capsule 500 mg (500 mg Oral Given 03/30/22 1009)  amiodarone (PACERONE) tablet 100 mg (100 mg Oral Given 03/30/22 1007)  metoprolol succinate (TOPROL-XL) 24 hr tablet 25 mg (25 mg Oral Given 03/30/22 1009)  levothyroxine (SYNTHROID) tablet 125 mcg (125 mcg Oral Given 03/30/22 0549)  apixaban (ELIQUIS) tablet 2.5 mg (2.5 mg Oral Given 03/30/22 1008)  ipratropium-albuterol (DUONEB) 0.5-2.5 (3) MG/3ML nebulizer solution 3 mL (has no administration in time range)  acetaminophen (TYLENOL) tablet 650 mg (650 mg Oral Given 03/30/22 0549)    Or  acetaminophen (TYLENOL) suppository 650 mg ( Rectal See Alternative 03/30/22 0549)  senna-docusate (Senokot-S) tablet 1 tablet (has no administration in time range)  furosemide (LASIX) tablet 40 mg (40 mg Oral Given 03/30/22 1009)  furosemide (LASIX) tablet 40 mg (has no administration in time range)  losartan (COZAAR) tablet 25 mg (25 mg Oral Given 03/30/22 1009)                                                                                                                                     Procedures Procedures  (including critical care time)  Medical Decision Making / ED Course   This patient presents to the ED for concern of  frequent falls, this involves an extensive  number of treatment options, and is a complaint that carries with it a high risk of complications and morbidity.  The differential diagnosis includes deconditioning, overdiuresis, fracture, abrasion, intracranial injury, electrolyte abnormality, UTI  MDM: Patient seen in the emergency department for evaluation of frequent falls.  Physical exam with bilateral skin tears over the forearms but is otherwise unremarkable.  Laboratory evaluation with a BUN of 25, creatinine 1.06, anemia to 11.8, urinalysis unremarkable, trauma imaging of the head, C-spine and chest is unremarkable.  Patient was then readmitted back to her original service for physical therapy and possible placement.   Additional history obtained: -Additional history obtained from granddaughter -External records from outside source obtained and reviewed including: Chart review including previous notes, labs, imaging, consultation notes   Lab Tests: -I ordered, reviewed, and interpreted labs.   The pertinent results include:   Labs Reviewed  CBC WITH DIFFERENTIAL/PLATELET - Abnormal; Notable for the following components:      Result Value   Hemoglobin 11.8 (*)    RDW 21.8 (*)    All other components within normal limits  BASIC METABOLIC PANEL - Abnormal; Notable for the following components:   Glucose, Bld 105 (*)    BUN 25 (*)    Creatinine, Ser 1.23 (*)    Calcium 8.3 (*)    GFR, Estimated 44 (*)    All other components within normal limits  URINALYSIS, ROUTINE W REFLEX MICROSCOPIC - Abnormal; Notable for the following components:   Hgb urine dipstick SMALL (*)    All other components within normal limits  BASIC METABOLIC PANEL - Abnormal; Notable for the following components:   BUN 25 (*)    Creatinine, Ser 1.06 (*)    Calcium 8.3 (*)    GFR, Estimated 52 (*)    All other components within normal limits  CBC - Abnormal; Notable for the following components:   Hemoglobin 11.3 (*)    RDW 21.5 (*)    Platelets 147 (*)     All other components within normal limits  URINE CULTURE  LACTIC ACID, PLASMA      Imaging Studies ordered: I ordered imaging studies including CT head, C-spine, chest x-ray I independently visualized and interpreted imaging. I agree with the radiologist interpretation   Medicines ordered and prescription drug management: Meds ordered this encounter  Medications   cephALEXin (KEFLEX) capsule 500 mg   amiodarone (PACERONE) tablet 100 mg   DISCONTD: furosemide (LASIX) tablet 20 mg    Take 20 mg daily with additional doses if weight up by 3 Lbs or shortness of breath or swelling.     metoprolol succinate (TOPROL-XL) 24 hr tablet 25 mg   levothyroxine (SYNTHROID) tablet 125 mcg   apixaban (ELIQUIS) tablet 2.5 mg   ipratropium-albuterol (DUONEB) 0.5-2.5 (3) MG/3ML nebulizer solution 3 mL   OR Linked Order Group    acetaminophen (TYLENOL) tablet 650 mg    acetaminophen (TYLENOL) suppository 650 mg   senna-docusate (Senokot-S) tablet 1 tablet   furosemide (LASIX) tablet 40 mg    Take 20 mg daily with additional doses if weight up by 3 Lbs or shortness of breath or swelling.     furosemide (LASIX) tablet 40 mg   losartan (COZAAR) tablet 25 mg    -I have reviewed the patients home medicines and have made adjustments as needed  Critical interventions none  Cardiac Monitoring: The patient was maintained on a cardiac monitor.  I personally viewed and interpreted the  cardiac monitored which showed an underlying rhythm of: NSR  Social Determinants of Health:  Factors impacting patients care include: Lives with elderly husband   Reevaluation: After the interventions noted above, I reevaluated the patient and found that they have :improved  Co morbidities that complicate the patient evaluation  Past Medical History:  Diagnosis Date   Anemia    hx of anemia   Arthritis    Breast cancer ([place])    Left breast   Chronic combined systolic and diastolic CHF (congestive heart  failure) ([place]) 09/22/2018   History of skin cancer    Hypertension    Hypothyroidism    PVC (premature ventricular contraction)       Dispostion: I considered admission for this patient, and given need for physical therapy evaluation and possible placement, patient admitted.     Final Clinical Impression(s) / ED Diagnoses Final diagnoses:  None     '@PCDICTATION'$ @    Teressa Lower, MD 03/30/22 1131

## 2022-03-30 NOTE — Plan of Care (Signed)
Pt alert and oriented x 4. Pt reports being unable to stand. Daily weight obtained via bed this am. All skin tears and abrasions measured. Dsg changed with Vaseline gauze. Foam placed on scab to LLLeg. Pt received only 1 prn tylenol this am.  Problem: Education: Goal: Knowledge of General Education information will improve Description: Including pain rating scale, medication(s)/side effects and non-pharmacologic comfort measures Outcome: Progressing   Problem: Health Behavior/Discharge Planning: Goal: Ability to manage health-related needs will improve Outcome: Progressing   Problem: Clinical Measurements: Goal: Ability to maintain clinical measurements within normal limits will improve Outcome: Progressing Goal: Will remain free from infection Outcome: Progressing Goal: Diagnostic test results will improve Outcome: Progressing Goal: Respiratory complications will improve Outcome: Progressing Goal: Cardiovascular complication will be avoided Outcome: Progressing   Problem: Activity: Goal: Risk for activity intolerance will decrease Outcome: Progressing   Problem: Nutrition: Goal: Adequate nutrition will be maintained Outcome: Progressing   Problem: Coping: Goal: Level of anxiety will decrease Outcome: Progressing   Problem: Elimination: Goal: Will not experience complications related to bowel motility Outcome: Progressing Goal: Will not experience complications related to urinary retention Outcome: Progressing   Problem: Pain Managment: Goal: General experience of comfort will improve Outcome: Progressing   Problem: Safety: Goal: Ability to remain free from injury will improve Outcome: Progressing   Problem: Skin Integrity: Goal: Risk for impaired skin integrity will decrease Outcome: Progressing

## 2022-03-30 NOTE — Progress Notes (Addendum)
PROGRESS NOTE  Heidi Adams  OHY:[id number] DOB: Oct 12, 1935 DOA: 03/29/2022 PCP: [name], MD   Brief Narrative: Patient is a 86 year old female with history of chronic combined systolic/diastolic CHF, hypothyroidism, paroxysmal A-fib on Eliquis, CKD stage IIIa, recent admission for acute CHF and was discharged on 9/5 presented on the same day with complaints of multiple falls at home.  On her last admission, she was treated with IV diuresis, right leg cellulitis.  On presentation ,she was febrile up to 38 degrees, saturating well on room air with stable blood pressure.  No acute findings noted on CT head or CT cervical spine.  Chest x-ray negative for acute cardiopulmonary disease.  Patient was readmitted for recurrent falls with intention of PT evaluation.  Assessment & Plan:  Principal Problem:   Debility Active Problems:   Cellulitis   Hypothyroidism   AF (paroxysmal atrial fibrillation) (HCC)   Chronic systolic heart failure (HCC)   Fever   Stage 3a chronic kidney disease (CKD) (HCC)   Recurrent falls/debility: Returns to ED on the same day of discharge after recurrent falls at home, feeling weak.  No acute findings on the head CT. PT/OT consulted.  Patient interested on being placed in skilled nursing facility.  Fever/suspicion for UTI: She was mildly febrile on presentation.  Did not appear septic on presentation.  She was recently treated for right lower extremity cellulitis.  Complains of dysuria.  UA was not reassuring.  Follow-up urine culture.  Currently she is on Keflex.  Recent right lower extremity cellulitis: Treated with ceftriaxone during last hospitalization, now on Keflex.  Continue this antibiotic to finish the course  Chronic combined systolic/diastolic CHF: Recently admitted for acute CHF.  Now appears near euvolemic status.  On Lasix.  Losartan on hold given elevated creatinine.  Follows with Dr. [name].  Echo as per 6/23 showed EF of less than 20%, grade 2  diastolic dysfunction, moderate left ventricular hypertrophy, hypokinetic global wall motion, moderate MR.  She will follow-up with Dr. [name] as an outpatient.  CKD stage IIIa: Creatinine of 1.23 on admission, baseline creatinine normal.  Losartan on hold.  Continue to monitor  Hypertension: Currently blood pressure stable.  On losartan, Toprol at home.  Losartan on hold  Hypothyroidism: Continue Synthyroid.  Recent thyroid function test showed high TSH of 9.8, low T3, elevated T4.  Do a repeat TFT in 6 to 8 weeks.  Paroxysmal A-fib: On NSR.  Continue Eliquis, Toprol, amiodarone.        DVT prophylaxis:apixaban (ELIQUIS) tablet 2.5 mg Start: 03/29/22 2200 apixaban (ELIQUIS) tablet 2.5 mg     Code Status: DNR  Family Communication: Called son on phone on 03/30/22  Patient status:Inpatient  Patient is from :Home  Anticipated discharge to:SNF  Estimated DC date:2-3 days   Consultants: None  Procedures:None  Antimicrobials:  Anti-infectives (From admission, onward)    Start     Dose/Rate Route Frequency Ordered Stop   03/29/22 2200  cephALEXin (KEFLEX) capsule 500 mg        500 mg Oral 2 times daily 03/29/22 2155         Subjective: Patient seen and examined at bedside this morning.  Hemodynamically stable, on room air.  Complains of some dyspnea but speaking in full sentences, looks comfortable.  She is being assessed by PT  Objective: Vitals:   03/29/22 2337 03/30/22 0202 03/30/22 0500 03/30/22 0523  BP: (!) 148/84 (!) 165/81  (!) 151/79  Pulse: 67 (!) 59  60  Resp: 16  14  14  Temp: 98 F (36.7 C) (!) 97.1 F (36.2 C)  97.7 F (36.5 C)  TempSrc: Oral     SpO2: 98% 95%  99%  Weight:   54.7 kg   Height:        Intake/Output Summary (Last 24 hours) at 03/30/2022 1607 Last data filed at 03/30/2022 0600 Gross per 24 hour  Intake 0 ml  Output 100 ml  Net -100 ml   Filed Weights   03/29/22 1916 03/30/22 0500  Weight: 56.7 kg 54.7 kg     Examination:  General exam: Overall comfortable, not in distress, pleasant elderly deconditioned female HEENT: PERRL Respiratory system:  no wheezes or crackles, diminished sounds on bases Cardiovascular system: S1 & S2 heard, RRR.  Gastrointestinal system: Abdomen is nondistended, soft and nontender. Central nervous system: Alert and oriented Extremities: Trace bilateral lower extremity edema, no clubbing ,no cyanosis Skin:  skin breakdowns, several papular   Data Reviewed: I have personally reviewed following labs and imaging studies  CBC: Recent Labs  Lab 03/27/22 1439 03/28/22 0413 03/29/22 0438 03/29/22 2007 03/30/22 0402  WBC 6.3 5.5 5.0 7.3 5.4  NEUTROABS 5.1 3.8 3.6 6.0  --   HGB 13.3 11.7* 11.9* 11.8* 11.3*  HCT 44.9 39.2 39.7 39.2 37.4  MCV 94.1 93.8 92.8 92.0 92.8  PLT 196 167 174 178 371*   Basic Metabolic Panel: Recent Labs  Lab 03/27/22 1439 03/28/22 0413 03/29/22 0438 03/29/22 2007 03/30/22 0402  NA 143 143 141 136 139  K 4.5 2.8* 4.5 4.5 4.0  CL 106 106 105 102 104  CO2 '26 29 29 24 29  '$ GLUCOSE 116* 97 102* 105* 93  BUN '21 20 20 '$ 25* 25*  CREATININE 1.06* 0.99 1.19* 1.23* 1.06*  CALCIUM 8.7* 8.5* 8.2* 8.3* 8.3*  MG  --  1.9 2.3  --   --      Recent Results (from the past 240 hour(s))  Blood culture (routine x 2)     Status: None (Preliminary result)   Collection Time: 03/27/22  2:15 PM   Specimen: Right Antecubital; Blood  Result Value Ref Range Status   Specimen Description   Final    RIGHT ANTECUBITAL BLOOD Performed at [facility], [place] [address], [place], [place] [address]    Special Requests   Final    BOTTLES DRAWN AEROBIC AND ANAEROBIC Blood Culture adequate volume Performed at [facility] [address] [place], [place] [address]    Culture   Final    NO GROWTH 3 DAYS Performed at [facility], [place] [address] [place], [place] [address]    Report Status PENDING  Incomplete  Blood culture  (routine x 2)     Status: None (Preliminary result)   Collection Time: 03/27/22  2:30 PM   Specimen: BLOOD RIGHT ARM  Result Value Ref Range Status   Specimen Description   Final    BLOOD RIGHT ARM Performed at [facility] [address] [place], [place] [address]    Special Requests   Final    BOTTLES DRAWN AEROBIC AND ANAEROBIC Blood Culture adequate volume Performed at [facility] [address], [place], [place] [address]    Culture   Final    NO GROWTH 3 DAYS Performed at [facility], [place] [address], [place], [place] [address]    Report Status PENDING  Incomplete     Radiology Studies: CT Head Wo Contrast  Result Date: 03/29/2022 CLINICAL DATA:  Headache.  Fall. EXAM: CT HEAD WITHOUT CONTRAST CT CERVICAL SPINE WITHOUT CONTRAST TECHNIQUE: Multidetector CT imaging of the head and cervical spine was performed following the standard protocol without intravenous contrast. Multiplanar CT image reconstructions of the cervical spine were also generated. RADIATION DOSE REDUCTION: This exam was performed according to the departmental dose-optimization program which includes automated exposure control, adjustment of the mA and/or kV according to patient size and/or use of iterative reconstruction technique. COMPARISON:  Head CT dated 02/14/2022. FINDINGS: CT HEAD FINDINGS Brain: Mild age-related atrophy and chronic microvascular ischemic changes. There is no acute intracranial hemorrhage. No mass effect or midline shift. No extra-axial fluid collection. Small partially calcified left frontal meningioma. Vascular: No hyperdense vessel or unexpected calcification. Skull: Normal. Negative for fracture or focal lesion. Sinuses/Orbits: No acute finding. Other: None CT CERVICAL SPINE FINDINGS Alignment: No acute subluxation. Skull base and vertebrae: No acute fracture.  Osteopenia. Soft tissues and spinal canal: No prevertebral fluid or swelling. No visible  canal hematoma. Disc levels:  No acute findings.  Degenerative changes. Upper chest: Negative. Other: Bilateral carotid bulb calcified plaques. IMPRESSION: 1. No acute intracranial pathology. Mild age-related atrophy and chronic microvascular ischemic changes. 2. No acute cervical spine fracture or subluxation. Electronically Signed   By: Anner Crete M.D.   On: 03/29/2022 20:09   CT Cervical Spine Wo Contrast  Result Date: 03/29/2022 CLINICAL DATA:  Headache.  Fall. EXAM: CT HEAD WITHOUT CONTRAST CT CERVICAL SPINE WITHOUT CONTRAST TECHNIQUE: Multidetector CT imaging of the head and cervical spine was performed following the standard protocol without intravenous contrast. Multiplanar CT image reconstructions of the cervical spine were also generated. RADIATION DOSE REDUCTION: This exam was performed according to the departmental dose-optimization program which includes automated exposure control, adjustment of the mA and/or kV according to patient size and/or use of iterative reconstruction technique. COMPARISON:  Head CT dated 02/14/2022. FINDINGS: CT HEAD FINDINGS Brain: Mild age-related atrophy and chronic microvascular ischemic changes. There is no acute intracranial hemorrhage. No mass effect or midline shift. No extra-axial fluid collection. Small partially calcified left frontal meningioma. Vascular: No hyperdense vessel or unexpected calcification. Skull: Normal. Negative for fracture or focal lesion. Sinuses/Orbits: No acute finding. Other: None CT CERVICAL SPINE FINDINGS Alignment: No acute subluxation. Skull base and vertebrae: No acute fracture.  Osteopenia. Soft tissues and spinal canal: No prevertebral fluid or swelling. No visible canal hematoma. Disc levels:  No acute findings.  Degenerative changes. Upper chest: Negative. Other: Bilateral carotid bulb calcified plaques. IMPRESSION: 1. No acute intracranial pathology. Mild age-related atrophy and chronic microvascular ischemic changes. 2. No  acute cervical spine fracture or subluxation. Electronically Signed   By: Anner Crete M.D.   On: 03/29/2022 20:09   DG Chest 1 View  Result Date: 03/29/2022 CLINICAL DATA:  Fever EXAM: CHEST  1 VIEW COMPARISON:  03/27/2022 FINDINGS: Cardiomegaly. No acute airspace disease or effusion. Aortic atherosclerosis. No pneumothorax. IMPRESSION: No active disease.  Cardiomegaly. Electronically Signed   By: Donavan Foil M.D.   On: 03/29/2022 19:45    Scheduled Meds:  amiodarone  100 mg Oral Daily   apixaban  2.5 mg Oral BID   cephALEXin  500 mg Oral BID   furosemide  40 mg Oral BID   levothyroxine  125 mcg Oral Q0600   metoprolol succinate  25 mg Oral Daily   Continuous Infusions:   LOS: 0 days   Shelly Coss, MD [facility] P9/12/2021, 9:09 AM

## 2022-03-31 ENCOUNTER — Observation Stay (HOSPITAL_COMMUNITY): Payer: PPO

## 2022-03-31 DIAGNOSIS — M7989 Other specified soft tissue disorders: Secondary | ICD-10-CM | POA: Diagnosis not present

## 2022-03-31 DIAGNOSIS — Z9889 Other specified postprocedural states: Secondary | ICD-10-CM | POA: Diagnosis not present

## 2022-03-31 DIAGNOSIS — R5381 Other malaise: Secondary | ICD-10-CM | POA: Diagnosis not present

## 2022-03-31 DIAGNOSIS — S59902A Unspecified injury of left elbow, initial encounter: Secondary | ICD-10-CM | POA: Diagnosis not present

## 2022-03-31 LAB — BASIC METABOLIC PANEL
Anion gap: 10 (ref 5–15)
BUN: 27 mg/dL — ABNORMAL HIGH (ref 8–23)
CO2: 26 mmol/L (ref 22–32)
Calcium: 8.2 mg/dL — ABNORMAL LOW (ref 8.9–10.3)
Chloride: 100 mmol/L (ref 98–111)
Creatinine, Ser: 1.14 mg/dL — ABNORMAL HIGH (ref 0.44–1.00)
GFR, Estimated: 48 mL/min — ABNORMAL LOW (ref >60)
Glucose, Bld: 93 mg/dL (ref 70–99)
Potassium: 3.6 mmol/L (ref 3.5–5.1)
Sodium: 136 mmol/L (ref 135–145)

## 2022-03-31 LAB — URINE CULTURE: Culture: NO GROWTH

## 2022-03-31 MED ORDER — AMLODIPINE BESYLATE 10 MG PO TABS: 10.0000 mg | ORAL_TABLET | Freq: Every day | ORAL | Status: DC

## 2022-03-31 MED ORDER — AMLODIPINE BESYLATE 5 MG PO TABS
2.5000 mg | ORAL_TABLET | Freq: Two times a day (BID) | ORAL | Status: DC
  Administered 2022-03-31 – 2022-04-01 (×3): 2.5 mg via ORAL
  Filled 2022-03-31 (×3): qty 1

## 2022-03-31 NOTE — TOC Progression Note (Signed)
Transition of Care [facility]) - Progression Note    Patient Details  Name: Heidi Adams MRN: [id number] Date of Birth: 1936-03-06  Transition of Care [facility]) CM/SW Contact  Servando Snare, [place] Phone Number: 03/31/2022, 10:57 AM  Clinical Narrative:  TOC followed up with [facility]. Auth still pending. Will continue to follow.     Expected Discharge Plan: Skilled Nursing Facility Barriers to Discharge: SNF Pending bed offer, Insurance Authorization  Expected Discharge Plan and Services Expected Discharge Plan: [place] In-house Referral: Clinical Social Work   Post Acute Care Choice: [place] Living arrangements for the past 2 months: Single Family Home                 DME Arranged: N/A DME Agency: NA                   Social Determinants of Health (SDOH) Interventions Food Insecurity Interventions: Patient Refused  Readmission Risk Interventions     No data to display

## 2022-03-31 NOTE — Plan of Care (Signed)

## 2022-03-31 NOTE — Progress Notes (Signed)
PROGRESS NOTE  Heidi Adams  JSE:[id number] DOB: Jan 06, 1936 DOA: 03/29/2022 PCP: [name], MD   Brief Narrative: Patient is a 86 year old female with history of chronic combined systolic/diastolic CHF, hypothyroidism, paroxysmal A-fib on Eliquis, CKD stage IIIa, recent admission for acute CHF and was discharged on 9/5 presented on the same day with complaints of multiple falls at home.  On her last admission, she was treated with IV diuresis, right leg cellulitis.  On presentation ,she was febrile up to 38 degrees, saturating well on room air with stable blood pressure.  No acute findings noted on CT head or CT cervical spine.  Chest x-ray negative for acute cardiopulmonary disease.  Patient was readmitted for recurrent falls with intention of PT evaluation.  PT/OT recommended skilled nursing facility.  Medically stable for discharge, waiting for authorization.  Assessment & Plan:  Principal Problem:   Debility Active Problems:   Cellulitis   Hypothyroidism   AF (paroxysmal atrial fibrillation) (HCC)   Chronic systolic heart failure (HCC)   Fever   Stage 3a chronic kidney disease (CKD) (HCC)   Recurrent falls/debility: Returns to ED on the same day of discharge after recurrent falls at home, feeling weak.  No acute findings on the head CT. PT/OT consulted.  Recommendation is skilled nursing facility.  Fever/suspicion for UTI: She was mildly febrile on presentation.  Did not appear septic on presentation.  She was recently treated for right lower extremity cellulitis.  Complains of dysuria.  UA was not reassuring.  Urine culture did not show any growth  Recent right lower extremity cellulitis: Treated with ceftriaxone during last hospitalization, now on Keflex.  Continue this antibiotic to finish the course  Chronic combined systolic/diastolic CHF: Recently admitted for acute CHF.  Now appears near euvolemic status.  On Lasix.  Losartan on hold given elevated creatinine.  Follows with  Dr. [name].  Echo as per 6/23 showed EF of less than 07%, grade 2 diastolic dysfunction, moderate left ventricular hypertrophy, hypokinetic global wall motion, moderate MR.  She will follow-up with Dr. [name] as an outpatient.  CKD stage IIIa: Creatinine of 1.23 on admission, baseline creatinine normal.  Losartan on hold.  Continue to monitor  Hypertension: Currently blood pressure slightly up..  On losartan, Toprol, amlodipine.  Patient does not want to change any of her blood pressure medications  Hypothyroidism: Continue Synthyroid.  Recent thyroid function test showed high TSH of 9.8, low T3, elevated T4.  Do a repeat TFT in 6 to 8 weeks.  Paroxysmal A-fib: On NSR.  Continue Eliquis, Toprol, amiodarone.  Left elbow pain: X-ray of the left elbow did not show any fracture or dislocation.      DVT prophylaxis:     Code Status: DNR  Family Communication: Called son on phone on 03/30/22  Patient status:Inpatient  Patient is from :Home  Anticipated discharge to:SNF  Estimated DC date: As soon as possible   Consultants: None  Procedures:None  Antimicrobials:  Anti-infectives (From admission, onward)    Start     Dose/Rate Route Frequency Ordered Stop   03/29/22 2200  cephALEXin (KEFLEX) capsule 500 mg        500 mg Oral 2 times daily 03/29/22 2155         Subjective: Patient seen and examined at bedside today.  Appears comfortable, sitting in the chair.  Denies any new complaints except for left elbow pain.  Because  Objective: Vitals:   03/30/22 2042 03/31/22 0500 03/31/22 0632 03/31/22 0928  BP: (!) 146/80  Marland Kitchen)  156/94 (!) 144/72  Pulse: 63  65 64  Resp: '16  16 17  '$ Temp:    97.7 F (36.5 C)  TempSrc:      SpO2: 97%  98% 96%  Weight:  55.8 kg    Height:        Intake/Output Summary (Last 24 hours) at 03/31/2022 1420 Last data filed at 03/31/2022 1019 Gross per 24 hour  Intake 960 ml  Output 1200 ml  Net -240 ml   Filed Weights   03/29/22 1916 03/30/22 0500  03/31/22 0500  Weight: 56.7 kg 54.7 kg 55.8 kg    Examination:  General exam: Overall comfortable, not in distress, deconditioned, weak HEENT: PERRL Respiratory system:  no wheezes or crackles  Cardiovascular system: S1 & S2 heard, RRR.  Gastrointestinal system: Abdomen is nondistended, soft and nontender. Central nervous system: Alert and oriented Extremities: No edema, no clubbing ,no cyanosis Skin: Superficial skin breakdown, scattered ecchymosis   Data Reviewed: I have personally reviewed following labs and imaging studies  CBC: Recent Labs  Lab 03/27/22 1439 03/28/22 0413 03/29/22 0438 03/29/22 2007 03/30/22 0402  WBC 6.3 5.5 5.0 7.3 5.4  NEUTROABS 5.1 3.8 3.6 6.0  --   HGB 13.3 11.7* 11.9* 11.8* 11.3*  HCT 44.9 39.2 39.7 39.2 37.4  MCV 94.1 93.8 92.8 92.0 92.8  PLT 196 167 174 178 790*   Basic Metabolic Panel: Recent Labs  Lab 03/28/22 0413 03/29/22 0438 03/29/22 2007 03/30/22 0402 03/31/22 0405  NA 143 141 136 139 136  K 2.8* 4.5 4.5 4.0 3.6  CL 106 105 102 104 100  CO2 '29 29 24 29 26  '$ GLUCOSE 97 102* 105* 93 93  BUN 20 20 25* 25* 27*  CREATININE 0.99 1.19* 1.23* 1.06* 1.14*  CALCIUM 8.5* 8.2* 8.3* 8.3* 8.2*  MG 1.9 2.3  --   --   --      Recent Results (from the past 240 hour(s))  Blood culture (routine x 2)     Status: None (Preliminary result)   Collection Time: 03/27/22  2:15 PM   Specimen: Right Antecubital; Blood  Result Value Ref Range Status   Specimen Description   Final    RIGHT ANTECUBITAL BLOOD Performed at [facility], [address] [place], [place] [address]    Special Requests   Final    BOTTLES DRAWN AEROBIC AND ANAEROBIC Blood Culture adequate volume Performed at [facility] [address], [place], [place] [address]    Culture   Final    NO GROWTH 4 DAYS Performed at [facility], [place] [address], [place], [place] [address]    Report Status PENDING  Incomplete  Blood culture (routine x  2)     Status: None (Preliminary result)   Collection Time: 03/27/22  2:30 PM   Specimen: BLOOD RIGHT ARM  Result Value Ref Range Status   Specimen Description   Final    BLOOD RIGHT ARM Performed at [facility] [address] [place], [place] [address]    Special Requests   Final    BOTTLES DRAWN AEROBIC AND ANAEROBIC Blood Culture adequate volume Performed at [facility] [address], [place], [place] [address]    Culture   Final    NO GROWTH 4 DAYS Performed at [facility], [place] [address], [place], [place] [address]    Report Status PENDING  Incomplete  Urine Culture     Status: None  Collection Time: 03/29/22  7:22 PM   Specimen: Urine, Clean Catch  Result Value Ref Range Status   Specimen Description   Final    URINE, CLEAN CATCH Performed at [facility], [place] [address] [place], [place] [address]    Special Requests   Final    NONE Performed at [facility], [place] [address] [place], [place] [address]    Culture   Final    NO GROWTH Performed at [facility], [place] [address] [place], [place] [address]    Report Status 03/31/2022 FINAL  Final     Radiology Studies: DG Elbow 2 Views Left  Result Date: 03/31/2022 CLINICAL DATA:  Fall.  Left elbow injury.  Initial encounter. EXAM: LEFT ELBOW - 2 VIEW COMPARISON:  Left elbow radiographs 02/14/2022 FINDINGS: There is again diffuse decreased bone mineralization. Postsurgical changes are again seen of remote resection of the proximal radial head and neck. There is again severe humeral-ulnar arthritis including bone-on-bone contact, moderate cortical erosion, sclerosis, and peripheral bony spurring/irregularity. Mild-to-moderate regional soft tissue swelling is similar to prior. No acute fracture is seen. IMPRESSION: No significant change from prior. Prior resection of the left radial head and neck. Severe humeral-ulnar osteoarthritis  with bone-on-bone contact and cortical erosions. Electronically Signed   By: Yvonne Kendall M.D.   On: 03/31/2022 12:29   CT Head Wo Contrast  Result Date: 03/29/2022 CLINICAL DATA:  Headache.  Fall. EXAM: CT HEAD WITHOUT CONTRAST CT CERVICAL SPINE WITHOUT CONTRAST TECHNIQUE: Multidetector CT imaging of the head and cervical spine was performed following the standard protocol without intravenous contrast. Multiplanar CT image reconstructions of the cervical spine were also generated. RADIATION DOSE REDUCTION: This exam was performed according to the departmental dose-optimization program which includes automated exposure control, adjustment of the mA and/or kV according to patient size and/or use of iterative reconstruction technique. COMPARISON:  Head CT dated 02/14/2022. FINDINGS: CT HEAD FINDINGS Brain: Mild age-related atrophy and chronic microvascular ischemic changes. There is no acute intracranial hemorrhage. No mass effect or midline shift. No extra-axial fluid collection. Small partially calcified left frontal meningioma. Vascular: No hyperdense vessel or unexpected calcification. Skull: Normal. Negative for fracture or focal lesion. Sinuses/Orbits: No acute finding. Other: None CT CERVICAL SPINE FINDINGS Alignment: No acute subluxation. Skull base and vertebrae: No acute fracture.  Osteopenia. Soft tissues and spinal canal: No prevertebral fluid or swelling. No visible canal hematoma. Disc levels:  No acute findings.  Degenerative changes. Upper chest: Negative. Other: Bilateral carotid bulb calcified plaques. IMPRESSION: 1. No acute intracranial pathology. Mild age-related atrophy and chronic microvascular ischemic changes. 2. No acute cervical spine fracture or subluxation. Electronically Signed   By: Anner Crete M.D.   On: 03/29/2022 20:09   CT Cervical Spine Wo Contrast  Result Date: 03/29/2022 CLINICAL DATA:  Headache.  Fall. EXAM: CT HEAD WITHOUT CONTRAST CT CERVICAL SPINE WITHOUT CONTRAST  TECHNIQUE: Multidetector CT imaging of the head and cervical spine was performed following the standard protocol without intravenous contrast. Multiplanar CT image reconstructions of the cervical spine were also generated. RADIATION DOSE REDUCTION: This exam was performed according to the departmental dose-optimization program which includes automated exposure control, adjustment of the mA and/or kV according to patient size and/or use of iterative reconstruction technique. COMPARISON:  Head CT dated 02/14/2022. FINDINGS: CT HEAD FINDINGS Brain: Mild age-related atrophy and chronic microvascular ischemic changes. There is no acute intracranial hemorrhage. No mass effect or midline shift. No extra-axial fluid collection. Small partially  calcified left frontal meningioma. Vascular: No hyperdense vessel or unexpected calcification. Skull: Normal. Negative for fracture or focal lesion. Sinuses/Orbits: No acute finding. Other: None CT CERVICAL SPINE FINDINGS Alignment: No acute subluxation. Skull base and vertebrae: No acute fracture.  Osteopenia. Soft tissues and spinal canal: No prevertebral fluid or swelling. No visible canal hematoma. Disc levels:  No acute findings.  Degenerative changes. Upper chest: Negative. Other: Bilateral carotid bulb calcified plaques. IMPRESSION: 1. No acute intracranial pathology. Mild age-related atrophy and chronic microvascular ischemic changes. 2. No acute cervical spine fracture or subluxation. Electronically Signed   By: Anner Crete M.D.   On: 03/29/2022 20:09   DG Chest 1 View  Result Date: 03/29/2022 CLINICAL DATA:  Fever EXAM: CHEST  1 VIEW COMPARISON:  03/27/2022 FINDINGS: Cardiomegaly. No acute airspace disease or effusion. Aortic atherosclerosis. No pneumothorax. IMPRESSION: No active disease.  Cardiomegaly. Electronically Signed   By: Donavan Foil M.D.   On: 03/29/2022 19:45    Scheduled Meds:  amiodarone  100 mg Oral Daily   amLODipine  2.5 mg Oral BID    cephALEXin  500 mg Oral BID   furosemide  40 mg Oral BID   [START ON 04/01/2022] furosemide  40 mg Oral Daily   levothyroxine  125 mcg Oral Q0600   losartan  25 mg Oral Daily   metoprolol succinate  25 mg Oral Daily   Continuous Infusions:   LOS: 0 days   Shelly Coss, MD [facility] P9/01/2022, 2:20 PM

## 2022-04-01 DIAGNOSIS — R1314 Dysphagia, pharyngoesophageal phase: Secondary | ICD-10-CM | POA: Diagnosis not present

## 2022-04-01 DIAGNOSIS — U071 COVID-19: Secondary | ICD-10-CM | POA: Diagnosis not present

## 2022-04-01 DIAGNOSIS — R531 Weakness: Secondary | ICD-10-CM | POA: Diagnosis not present

## 2022-04-01 DIAGNOSIS — R5381 Other malaise: Secondary | ICD-10-CM | POA: Diagnosis not present

## 2022-04-01 DIAGNOSIS — N189 Chronic kidney disease, unspecified: Secondary | ICD-10-CM | POA: Diagnosis not present

## 2022-04-01 DIAGNOSIS — R296 Repeated falls: Secondary | ICD-10-CM | POA: Diagnosis not present

## 2022-04-01 DIAGNOSIS — G8929 Other chronic pain: Secondary | ICD-10-CM | POA: Diagnosis not present

## 2022-04-01 DIAGNOSIS — E039 Hypothyroidism, unspecified: Secondary | ICD-10-CM | POA: Diagnosis not present

## 2022-04-01 DIAGNOSIS — Z7401 Bed confinement status: Secondary | ICD-10-CM | POA: Diagnosis not present

## 2022-04-01 DIAGNOSIS — I480 Paroxysmal atrial fibrillation: Secondary | ICD-10-CM | POA: Diagnosis not present

## 2022-04-01 DIAGNOSIS — I10 Essential (primary) hypertension: Secondary | ICD-10-CM | POA: Diagnosis not present

## 2022-04-01 DIAGNOSIS — I5040 Unspecified combined systolic (congestive) and diastolic (congestive) heart failure: Secondary | ICD-10-CM | POA: Diagnosis not present

## 2022-04-01 DIAGNOSIS — L03115 Cellulitis of right lower limb: Secondary | ICD-10-CM | POA: Diagnosis not present

## 2022-04-01 DIAGNOSIS — N1831 Chronic kidney disease, stage 3a: Secondary | ICD-10-CM | POA: Diagnosis not present

## 2022-04-01 LAB — BASIC METABOLIC PANEL
Anion gap: 8 (ref 5–15)
BUN: 24 mg/dL — ABNORMAL HIGH (ref 8–23)
CO2: 29 mmol/L (ref 22–32)
Calcium: 7.9 mg/dL — ABNORMAL LOW (ref 8.9–10.3)
Chloride: 101 mmol/L (ref 98–111)
Creatinine, Ser: 1.11 mg/dL — ABNORMAL HIGH (ref 0.44–1.00)
GFR, Estimated: 49 mL/min — ABNORMAL LOW (ref >60)
Glucose, Bld: 92 mg/dL (ref 70–99)
Potassium: 3 mmol/L — ABNORMAL LOW (ref 3.5–5.1)
Sodium: 138 mmol/L (ref 135–145)

## 2022-04-01 LAB — CULTURE, BLOOD (ROUTINE X 2)
Culture: NO GROWTH
Culture: NO GROWTH
Special Requests: ADEQUATE
Special Requests: ADEQUATE

## 2022-04-01 MED ORDER — METOPROLOL SUCCINATE ER 25 MG PO TB24: 25.0000 mg | ORAL_TABLET | Freq: Every day | ORAL | Status: DC

## 2022-04-01 MED ORDER — SENNOSIDES-DOCUSATE SODIUM 8.6-50 MG PO TABS: 1.0000 | ORAL_TABLET | Freq: Every evening | ORAL | Status: AC | PRN

## 2022-04-01 MED ORDER — CEPHALEXIN 500 MG PO CAPS: 500.0000 mg | ORAL_CAPSULE | Freq: Two times a day (BID) | ORAL | 0 refills | Status: AC

## 2022-04-01 MED ORDER — POTASSIUM CHLORIDE CRYS ER 20 MEQ PO TBCR
40.0000 meq | EXTENDED_RELEASE_TABLET | ORAL | Status: AC
  Administered 2022-04-01 (×2): 40 meq via ORAL
  Filled 2022-04-01 (×2): qty 2

## 2022-04-01 MED ORDER — FUROSEMIDE 40 MG PO TABS: 40.0000 mg | ORAL_TABLET | Freq: Every day | ORAL | Status: DC

## 2022-04-01 MED ORDER — LOSARTAN POTASSIUM 25 MG PO TABS: 25.0000 mg | ORAL_TABLET | Freq: Every day | ORAL | Status: DC

## 2022-04-01 MED ORDER — AMLODIPINE BESYLATE 2.5 MG PO TABS: 2.5000 mg | ORAL_TABLET | Freq: Two times a day (BID) | ORAL | Status: DC

## 2022-04-01 MED ORDER — POTASSIUM CHLORIDE CRYS ER 20 MEQ PO TBCR: 20.0000 meq | EXTENDED_RELEASE_TABLET | Freq: Every day | ORAL | Status: AC

## 2022-04-01 NOTE — Progress Notes (Signed)
Physical Therapy Treatment Patient Details Name: Heidi Adams MRN: [id number] DOB: 1935/09/10 Today's Date: 04/01/2022   History of Present Illness 86 yo female admitted with debility, weakness. Multiple falls at home after d/c home on 03/29/22. Hx of CHF, Afib, CKD, cellulitis, breast ca, PVCs, skin cancer, TKA, THA    PT Comments    General Comments: AxO x 3 pleasant.  Assisted OOB to amb a limited distance in hallway was difficult.  General Gait Details: Assist to stabilize pt throughout distance. Distance limited by weakness, fatigue. Pt reported knees feeling weak. Unsteady.  HIGH FALL RISK. Pt will need ST Rehab at SNF to address mobility and functional decline prior to safely returning home.   Recommendations for follow up therapy are one component of a multi-disciplinary discharge planning process, led by the attending physician.  Recommendations may be updated based on patient status, additional functional criteria and insurance authorization.  Follow Up Recommendations  Skilled nursing-short term rehab (<3 hours/day)     Assistance Recommended at Discharge Frequent or constant Supervision/Assistance  Patient can return home with the following A little help with walking and/or transfers;A little help with bathing/dressing/bathroom;Assist for transportation;Assistance with cooking/housework;Help with stairs or ramp for entrance   Equipment Recommendations  None recommended by PT    Recommendations for Other Services       Precautions / Restrictions Precautions Precautions: Fall Precaution Comments: pt reports multiple fall history Restrictions Weight Bearing Restrictions: No     Mobility  Bed Mobility Overal bed mobility: Needs Assistance Bed Mobility: Supine to Sit, Sit to Supine     Supine to sit: Mod assist Sit to supine: Mod assist   General bed mobility comments: Assist for trunk and to scoot to EOB. Increased time. Cues for technique.    Transfers Overall  transfer level: Needs assistance Equipment used: Rolling walker (2 wheels) Transfers: Sit to/from Stand Sit to Stand: Min assist, From elevated surface           General transfer comment: Assist to power up, stabilize, control descent. Cues for safety, technique, hand placement. Increased time.    Ambulation/Gait Ambulation/Gait assistance: Min assist Gait Distance (Feet): 22 Feet Assistive device: Rolling walker (2 wheels) Gait Pattern/deviations: Step-through pattern, Decreased stride length, Decreased step length - right, Decreased step length - left       General Gait Details: Assist to stabilize pt throughout distance. Distance limited by weakness, fatigue. Pt reported knees feeling weak. Unsteady.  HIGH FALL RISK.   Stairs             Wheelchair Mobility    Modified Rankin (Stroke Patients Only)       Balance                                            Cognition Arousal/Alertness: Awake/alert Behavior During Therapy: WFL for tasks assessed/performed Overall Cognitive Status: Within Functional Limits for tasks assessed                                 General Comments: AxO x 3 pleasant        Exercises      General Comments        Pertinent Vitals/Pain Pain Assessment Pain Assessment: No/denies pain    Home Living  Prior Function            PT Goals (current goals can now be found in the care plan section) Progress towards PT goals: Progressing toward goals    Frequency    Min 2X/week      PT Plan Current plan remains appropriate    Co-evaluation              AM-PAC PT "6 Clicks" Mobility   Outcome Measure  Help needed turning from your back to your side while in a flat bed without using bedrails?: A Little Help needed moving from lying on your back to sitting on the side of a flat bed without using bedrails?: A Little Help needed moving to and from a  bed to a chair (including a wheelchair)?: A Little Help needed standing up from a chair using your arms (e.g., wheelchair or bedside chair)?: A Little Help needed to walk in hospital room?: A Lot Help needed climbing 3-5 steps with a railing? : Total 6 Click Score: 15    End of Session Equipment Utilized During Treatment: Gait belt Activity Tolerance: Patient limited by fatigue;Patient tolerated treatment well Patient left: in bed;with call bell/phone within reach;with bed alarm set;with family/visitor present Nurse Communication: Mobility status PT Visit Diagnosis: Difficulty in walking, not elsewhere classified (R26.2);History of falling (Z91.81);Repeated falls (R29.6);Muscle weakness (generalized) (M62.81)     Time: 1219-1229 PT Time Calculation (min) (ACUTE ONLY): 10 min  Charges:  $Gait Training: 8-22 mins                     Rica Koyanagi  PTA [place] [facility] M-F          [phone] Weekend pager [phone]

## 2022-04-01 NOTE — Discharge Summary (Signed)
Physician Discharge Summary  Heidi Adams HKV:[id number] DOB: 1935-10-06 DOA: 03/29/2022  PCP: [name], MD  Admit date: 03/29/2022 Discharge date: 04/01/2022  Admitted From: Home Disposition:  Home  Discharge Condition:Stable CODE STATUS:DNR Diet recommendation: Heart Healthy  Brief/Interim Summary: Patient is a 86 year old female with history of chronic combined systolic/diastolic CHF, hypothyroidism, paroxysmal A-fib on Eliquis, CKD stage IIIa, recent admission for acute CHF and was discharged on 9/5 presented on the same day with complaints of multiple falls at home.  On her last admission, she was treated with IV diuresis, right leg cellulitis.  On presentation ,she was febrile up to 38 degrees, saturating well on room air with stable blood pressure.  No acute findings noted on CT head or CT cervical spine.  Chest x-ray negative for acute cardiopulmonary disease.  Patient was readmitted for recurrent falls with intention of PT evaluation.  PT/OT recommended skilled nursing facility.  Medically stable for discharge.  Following problems were addressed during hospitalization:    Recurrent falls/debility: Returns to ED on the same day of discharge after recurrent falls at home, feeling weak.  No acute findings on the head CT. PT/OT consulted.  Recommendation is skilled nursing facility.   Fever/suspicion for UTI: She was mildly febrile on presentation.  Did not appear septic on presentation.  She was recently treated for right lower extremity cellulitis.  Complains of dysuria.  UA was not reassuring.  Urine culture did not show any growth   Recent right lower extremity cellulitis: Treated with ceftriaxone during last hospitalization, now on Keflex.  Continue this antibiotic to finish the course   Chronic combined systolic/diastolic CHF: Recently admitted for acute CHF.  Now appears near euvolemic status.  On Lasix.  Losartan on hold given elevated creatinine.  Follows with Dr. [name].  Echo  as per 6/23 showed EF of less than 56%, grade 2 diastolic dysfunction, moderate left ventricular hypertrophy, hypokinetic global wall motion, moderate MR.  She will follow-up with Dr. [name] as an outpatient.   CKD stage IIIa: Creatinine of 1.23 on admission, baseline creatinine normal.  Do a BMP test in a week  Hypertension: Currently blood pressure slightly up..  On losartan, Toprol, amlodipine.  Patient does not want to change any of her blood pressure medications   Hypothyroidism: Continue Synthyroid.  Recent thyroid function test showed high TSH of 9.8, low T3, elevated T4.  Do a repeat TFT in 6 to 8 weeks.   Paroxysmal A-fib: On NSR.  Continue Eliquis, Toprol, amiodarone.   Left elbow pain: X-ray of the left elbow did not show any fracture or dislocation.    Discharge Diagnoses:  Principal Problem:   Debility Active Problems:   Cellulitis   Hypothyroidism   AF (paroxysmal atrial fibrillation) (HCC)   Chronic systolic heart failure (HCC)   Fever   Stage 3a chronic kidney disease (CKD) (HCC)    Discharge Instructions  Discharge Instructions     Diet - low sodium heart healthy   Complete by: As directed    Discharge instructions   Complete by: As directed    1)Please take prescribed medications as instructed 2)Do  a BMP test in a week to check your potassium level.   Increase activity slowly   Complete by: As directed    No wound care   Complete by: As directed       Allergies as of 04/01/2022       Reactions   Codeine Nausea Only   Epinephrine    Tachcardyia, chest  pains tremors   Hydrocodone-acetaminophen Other (See Comments)   Other reaction(s): sick on stomach per patient   Tenormin [atenolol] Rash        Medication List     TAKE these medications    acetaminophen 500 MG tablet Commonly known as: TYLENOL Take 1,000 mg by mouth 3 (three) times daily as needed for moderate pain.   amiodarone 200 MG tablet Commonly known as: PACERONE Take 0.5  tablets (100 mg total) by mouth daily.   amLODipine 2.5 MG tablet Commonly known as: NORVASC Take 1 tablet (2.5 mg total) by mouth 2 (two) times daily.   cephALEXin 500 MG capsule Commonly known as: KEFLEX Take 1 capsule (500 mg total) by mouth 2 (two) times daily for 2 days.   Eliquis 2.5 MG Tabs tablet Generic drug: apixaban TAKE 1 TABLET BY MOUTH 2 TIMES A DAY What changed: how much to take   furosemide 40 MG tablet Commonly known as: LASIX Take 1 tablet (40 mg total) by mouth daily. Start taking on: April 02, 2022 What changed:  medication strength how much to take additional instructions   ipratropium-albuterol 0.5-2.5 (3) MG/3ML Soln Commonly known as: DUONEB Take 3 mLs by nebulization every 6 (six) hours as needed (shortness of breath or wheezing).   levothyroxine 125 MCG tablet Commonly known as: SYNTHROID Take 125 mcg by mouth daily.   losartan 25 MG tablet Commonly known as: COZAAR Take 1 tablet (25 mg total) by mouth daily. Start taking on: April 02, 2022 What changed:  medication strength See the new instructions.   metoprolol succinate 25 MG 24 hr tablet Commonly known as: TOPROL-XL Take 1 tablet (25 mg total) by mouth daily. Start taking on: April 02, 2022 What changed: when to take this   pantoprazole 40 MG tablet Commonly known as: PROTONIX Take 40 mg by mouth daily as needed (indigestion/heartburn.).   potassium chloride SA 20 MEQ tablet Commonly known as: KLOR-CON M Take 1 tablet (20 mEq total) by mouth daily. Take 2 tablets( 40 meq) for next 3 days then continue taking once daily Start taking on: April 02, 2022 What changed:  medication strength when to take this additional instructions   senna-docusate 8.6-50 MG tablet Commonly known as: Senokot-S Take 1 tablet by mouth at bedtime as needed for mild constipation.        Contact information for after-discharge care     Destination     [facility] .   Service:  Skilled Nursing Contact information: [place] [address] [phone]                    Allergies  Allergen Reactions   Codeine Nausea Only   Epinephrine     Tachcardyia, chest pains tremors   Hydrocodone-Acetaminophen Other (See Comments)    Other reaction(s): sick on stomach per patient   Tenormin [Atenolol] Rash    Consultations: None   Procedures/Studies: DG Elbow 2 Views Left  Result Date: 03/31/2022 CLINICAL DATA:  Fall.  Left elbow injury.  Initial encounter. EXAM: LEFT ELBOW - 2 VIEW COMPARISON:  Left elbow radiographs 02/14/2022 FINDINGS: There is again diffuse decreased bone mineralization. Postsurgical changes are again seen of remote resection of the proximal radial head and neck. There is again severe humeral-ulnar arthritis including bone-on-bone contact, moderate cortical erosion, sclerosis, and peripheral bony spurring/irregularity. Mild-to-moderate regional soft tissue swelling is similar to prior. No acute fracture is seen. IMPRESSION: No significant change from prior. Prior resection of the  left radial head and neck. 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 Electronically Signed   By: Yvonne Kendall M.D.   On: 03/31/2022 12:29   CT Head Wo Contrast  Result Date: 03/29/2022 CLINICAL DATA:  Headache.  Fall. EXAM: CT HEAD WITHOUT CONTRAST CT CERVICAL SPINE WITHOUT CONTRAST TECHNIQUE: Multidetector CT imaging of the head and cervical spine was performed following the standard protocol without intravenous contrast. Multiplanar CT image reconstructions of the cervical spine were also generated. RADIATION DOSE REDUCTION: This exam was performed according to the departmental dose-optimization program which includes automated exposure control, adjustment of the mA and/or kV according to patient size and/or use of iterative reconstruction technique. COMPARISON:  Head CT dated 02/14/2022. FINDINGS: CT HEAD  FINDINGS Brain: Mild age-related atrophy and chronic microvascular ischemic changes. There is no acute intracranial hemorrhage. No mass effect or midline shift. No extra-axial fluid collection. Small partially calcified left frontal meningioma. Vascular: No hyperdense vessel or unexpected calcification. Skull: Normal. Negative for fracture or focal lesion. Sinuses/Orbits: No acute finding. Other: None CT CERVICAL SPINE FINDINGS Alignment: No acute subluxation. Skull base and vertebrae: No acute fracture.  Osteopenia. Soft tissues and spinal canal: No prevertebral fluid or swelling. No visible canal hematoma. Disc levels:  No acute findings.  Degenerative changes. Upper chest: Negative. Other: Bilateral carotid bulb calcified plaques. IMPRESSION: 1. No acute intracranial pathology. Mild age-related atrophy and chronic microvascular ischemic changes. 2. No acute cervical spine fracture or subluxation. Electronically Signed   By: Anner Crete M.D.   On: 03/29/2022 20:09   CT Cervical Spine Wo Contrast  Result Date: 03/29/2022 CLINICAL DATA:  Headache.  Fall. EXAM: CT HEAD WITHOUT CONTRAST CT CERVICAL SPINE WITHOUT CONTRAST TECHNIQUE: Multidetector CT imaging of the head and cervical spine was performed following the standard protocol without intravenous contrast. Multiplanar CT image reconstructions of the cervical spine were also generated. RADIATION DOSE REDUCTION: This exam was performed according to the departmental dose-optimization program which includes automated exposure control, adjustment of the mA and/or kV according to patient size and/or use of iterative reconstruction technique. COMPARISON:  Head CT dated 02/14/2022. FINDINGS: CT HEAD FINDINGS Brain: Mild age-related atrophy and chronic microvascular ischemic changes. There is no acute intracranial hemorrhage. No mass effect or midline shift. No extra-axial fluid collection. Small partially calcified left frontal meningioma. Vascular: No hyperdense  vessel or unexpected calcification. Skull: Normal. Negative for fracture or focal lesion. Sinuses/Orbits: No acute finding. Other: None CT CERVICAL SPINE FINDINGS Alignment: No acute subluxation. Skull base and vertebrae: No acute fracture.  Osteopenia. Soft tissues and spinal canal: No prevertebral fluid or swelling. No visible canal hematoma. Disc levels:  No acute findings.  Degenerative changes. Upper chest: Negative. Other: Bilateral carotid bulb calcified plaques. IMPRESSION: 1. No acute intracranial pathology. Mild age-related atrophy and chronic microvascular ischemic changes. 2. No acute cervical spine fracture or subluxation. Electronically Signed   By: Anner Crete M.D.   On: 03/29/2022 20:09   DG Chest 1 View  Result Date: 03/29/2022 CLINICAL DATA:  Fever EXAM: CHEST  1 VIEW COMPARISON:  03/27/2022 FINDINGS: Cardiomegaly. No acute airspace disease or effusion. Aortic atherosclerosis. No pneumothorax. IMPRESSION: No active disease.  Cardiomegaly. Electronically Signed   By: Donavan Foil M.D.   On: 03/29/2022 19:45   DG Tibia/Fibula Right  Result Date: 03/27/2022 CLINICAL DATA:  Swelling, open wounds and skin EXAM: RIGHT TIBIA AND FIBULA - 2 VIEW COMPARISON:  Ankle radiographs done on 08/26/2019 FINDINGS: There is diffuse soft tissue swelling in subcutaneous  plane throughout right lower leg extending to the ankle. There is previous right knee arthroplasty. No recent fracture or dislocation is seen. There are no focal lytic lesions. Dense calcification is seen in the plantar fascia. Small plantar spur is seen in calcaneus. Degenerative changes are noted in the intertarsal joints. IMPRESSION: No recent fracture or dislocation is seen. There are no focal lytic lesions. If there is clinical suspicion for osteomyelitis, follow-up MRI may be considered. Other findings as described in the body of the report. Electronically Signed   By: Elmer Picker M.D.   On: 03/27/2022 15:48   DG Chest 2  View  Result Date: 03/27/2022 CLINICAL DATA:  Chest pain and shortness of breath. EXAM: CHEST - 2 VIEW COMPARISON:  03/06/2022 FINDINGS: The cardio pericardial silhouette is enlarged. There is pulmonary vascular congestion without overt pulmonary edema. Prominent skin fold noted over the lateral left lung. The visualized bony structures of the thorax are unremarkable. Telemetry leads overlie the chest. IMPRESSION: Enlarged cardiopericardial silhouette with pulmonary vascular congestion. Electronically Signed   By: Misty Stanley M.D.   On: 03/27/2022 13:38   DG Chest Port 1 View  Result Date: 03/06/2022 CLINICAL DATA:  sob EXAM: PORTABLE CHEST 1 VIEW COMPARISON:  November 08, 2021 FINDINGS: Again seen is the moderate cardiomegaly with some calcifications at the arch of the aorta. Lungs remain clear. IMPRESSION: Moderate cardiomegaly.  No acute pleural or pulmonary process. Electronically Signed   By: Frazier Richards M.D.   On: 03/06/2022 09:22      Subjective: Patient seen and examined at the bedside today.  Hemodynamically stable without any complaints.  Medically stable for discharge to skilled nursing facility.   Discharge Exam: Vitals:   03/31/22 2230 04/01/22 0522  BP: 138/78 (!) 161/81  Pulse: (!) 58 62  Resp: 16 18  Temp: 97.8 F (36.6 C) 97.9 F (36.6 C)  SpO2: 94% 98%   Vitals:   03/31/22 0928 03/31/22 1453 03/31/22 2230 04/01/22 0522  BP: (!) 144/72 (!) 147/88 138/78 (!) 161/81  Pulse: 64 (!) 59 (!) 58 62  Resp: '17 18 16 18  '$ Temp: 97.7 F (36.5 C) (!) 97.5 F (36.4 C) 97.8 F (36.6 C) 97.9 F (36.6 C)  TempSrc:  Oral Oral Oral  SpO2: 96% 98% 94% 98%  Weight:    57 kg  Height:        General: Pt is alert, awake, not in acute distress Cardiovascular: RRR, S1/S2 +, no rubs, no gallops Respiratory: CTA bilaterally, no wheezing, no rhonchi Abdominal: Soft, NT, ND, bowel sounds + Extremities: Trace bilateral lower extremity edema, minor skin breakdowns, scattered  ecchymosis    The results of significant diagnostics from this hospitalization (including imaging, microbiology, ancillary and laboratory) are listed below for reference.     Microbiology: Recent Results (from the past 240 hour(s))  Blood culture (routine x 2)     Status: None   Collection Time: 03/27/22  2:15 PM   Specimen: Right Antecubital; Blood  Result Value Ref Range Status   Specimen Description   Final    RIGHT ANTECUBITAL BLOOD Performed at [facility], [address], [place], [place] [address]    Special Requests   Final    BOTTLES DRAWN AEROBIC AND ANAEROBIC Blood Culture adequate volume Performed at [place] [facility] [place] [address] [place], [place] [address]    Culture   Final    NO GROWTH 5 DAYS Performed at [facility], [place]  [address], [place], [place] [address]    Report Status 04/01/2022 FINAL  Final  Blood culture (routine x 2)     Status: None   Collection Time: 03/27/22  2:30 PM   Specimen: BLOOD RIGHT ARM  Result Value Ref Range Status   Specimen Description   Final    BLOOD RIGHT ARM Performed at [place] [address], [place], [place] [address]    Special Requests   Final    BOTTLES DRAWN AEROBIC AND ANAEROBIC Blood Culture adequate volume Performed at [facility] [address], [place], [place] [address]    Culture   Final    NO GROWTH 5 DAYS Performed at [facility], [place] [address] [place], [place] [address]    Report Status 04/01/2022 FINAL  Final  Urine Culture     Status: None   Collection Time: 03/29/22  7:22 PM   Specimen: Urine, Clean Catch  Result Value Ref Range Status   Specimen Description   Final    URINE, CLEAN CATCH Performed at [facility], [place] [address] [place], [place] [address]    Special Requests   Final    NONE Performed at [facility], [place] [address], [place], [place] [address]    Culture    Final    NO GROWTH Performed at [facility], [place] [address] [place], [place] [address]    Report Status 03/31/2022 FINAL  Final     Labs: BNP (last 3 results) Recent Labs    03/06/22 0859 03/27/22 1440 03/29/22 0438  BNP 2,638.9* 2,134.0* 9,935.7*   Basic Metabolic Panel: Recent Labs  Lab 03/28/22 0413 03/29/22 0438 03/29/22 2007 03/30/22 0402 03/31/22 0405 04/01/22 0407  NA 143 141 136 139 136 138  K 2.8* 4.5 4.5 4.0 3.6 3.0*  CL 106 105 102 104 100 101  CO2 '29 29 24 29 26 29  '$ GLUCOSE 97 102* 105* 93 93 92  BUN 20 20 25* 25* 27* 24*  CREATININE 0.99 1.19* 1.23* 1.06* 1.14* 1.11*  CALCIUM 8.5* 8.2* 8.3* 8.3* 8.2* 7.9*  MG 1.9 2.3  --   --   --   --    Liver Function Tests: No results for input(s): "AST", "ALT", "ALKPHOS", "BILITOT", "PROT", "ALBUMIN" in the last 168 hours. No results for input(s): "LIPASE", "AMYLASE" in the last 168 hours. No results for input(s): "AMMONIA" in the last 168 hours. CBC: Recent Labs  Lab 03/27/22 1439 03/28/22 0413 03/29/22 0438 03/29/22 2007 03/30/22 0402  WBC 6.3 5.5 5.0 7.3 5.4  NEUTROABS 5.1 3.8 3.6 6.0  --   HGB 13.3 11.7* 11.9* 11.8* 11.3*  HCT 44.9 39.2 39.7 39.2 37.4  MCV 94.1 93.8 92.8 92.0 92.8  PLT 196 167 174 178 147*   Cardiac Enzymes: No results for input(s): "CKTOTAL", "CKMB", "CKMBINDEX", "TROPONINI" in the last 168 hours. BNP: Invalid input(s): "POCBNP" CBG: No results for input(s): "GLUCAP" in the last 168 hours. D-Dimer No results for input(s): "DDIMER" in the last 72 hours. Hgb A1c No results for input(s): "HGBA1C" in the last 72 hours. Lipid Profile No results for input(s): "CHOL", "HDL", "LDLCALC", "TRIG", "CHOLHDL", "LDLDIRECT" in the last 72 hours. Thyroid function studies No results for input(s): "TSH", "T4TOTAL", "T3FREE", "THYROIDAB" in the last 72 hours.  Invalid input(s): "FREET3" Anemia work up No results for input(s): "VITAMINB12", "FOLATE", "FERRITIN", "TIBC", "IRON",  "RETICCTPCT" in the last 72 hours. Urinalysis    Component Value Date/Time  COLORURINE YELLOW 03/29/2022 1922   APPEARANCEUR CLEAR 03/29/2022 1922   LABSPEC 1.014 03/29/2022 [place] 5.0 03/29/2022 1922   GLUCOSEU NEGATIVE 03/29/2022 1922   HGBUR SMALL (A) 03/29/2022 [place] NEGATIVE 03/29/2022 [place] NEGATIVE 03/29/2022 [place] NEGATIVE 03/29/2022 1922   NITRITE NEGATIVE 03/29/2022 1922   LEUKOCYTESUR NEGATIVE 03/29/2022 1922   Sepsis Labs Recent Labs  Lab 03/28/22 0413 03/29/22 0438 03/29/22 2007 03/30/22 0402  WBC 5.5 5.0 7.3 5.4   Microbiology Recent Results (from the past 240 hour(s))  Blood culture (routine x 2)     Status: None   Collection Time: 03/27/22  2:15 PM   Specimen: Right Antecubital; Blood  Result Value Ref Range Status   Specimen Description   Final    RIGHT ANTECUBITAL BLOOD Performed at [facility], [address] [place], [place] [address]    Special Requests   Final    BOTTLES DRAWN AEROBIC AND ANAEROBIC Blood Culture adequate volume Performed at [facility] [address] [place], [place] [address]    Culture   Final    NO GROWTH 5 DAYS Performed at [facility], [place] [address], [place], [place] [address]    Report Status 04/01/2022 FINAL  Final  Blood culture (routine x 2)     Status: None   Collection Time: 03/27/22  2:30 PM   Specimen: BLOOD RIGHT ARM  Result Value Ref Range Status   Specimen Description   Final    BLOOD RIGHT ARM Performed at [facility] [address] [place], [place] [address]    Special Requests   Final    BOTTLES DRAWN AEROBIC AND ANAEROBIC Blood Culture adequate volume Performed at [place] [address], [place], [place] [address]    Culture   Final    NO GROWTH 5 DAYS Performed at [facility], [place] [address] [place], [place] [address]    Report Status 04/01/2022 FINAL  Final   Urine Culture     Status: None   Collection Time: 03/29/22  7:22 PM   Specimen: Urine, Clean Catch  Result Value Ref Range Status   Specimen Description   Final    URINE, CLEAN CATCH Performed at [facility], [place] [address], [place], [place] [address]    Special Requests   Final    NONE Performed at [facility], [place] [address] [place], [place] [address]    Culture   Final    NO GROWTH Performed at [facility], [place] [address] [place],  [address]    Report Status 03/31/2022 FINAL  Final    Please note: You were cared for by a hospitalist during your hospital stay. Once you are discharged, your primary care physician will handle any further medical issues. Please note that NO REFILLS for any discharge medications will be authorized once you are discharged, as it is imperative that you return to your primary care physician (or establish a relationship with a primary care physician if you do not have one) for your post hospital discharge needs so that they can reassess your need for medications and monitor your lab values.    Time coordinating discharge: 40 minutes  SIGNED:   Shelly Coss, MD  [facility] 04/01/2022, 10:00 AM Pager [phone]  If 7PM-7AM, please contact night-coverage [email or web address] Password [email or web address]

## 2022-04-01 NOTE — TOC Transition Note (Addendum)
Transition of Care [facility]) - CM/SW Discharge Note   Patient Details  Name: Heidi Adams MRN: [id number] Date of Birth: 30-Dec-1935  [facility] CM/SW Contact:  Servando Snare, LCSW Phone Number: 04/01/2022, 11:39 AM   Clinical Narrative:   Patient to dc to [facility] in [place] via [facility]. Daughter notified. [facility] arranged for 2pm.   Insurance auth [id number], Enzo Bi [id number]  RN # for report [phone] [place]    Final next level of care: [place] Barriers to Discharge: No Barriers Identified   Patient Goals and CMS Choice Patient states their goals for this hospitalization and ongoing recovery are:: Go to short-term rehab CMS Medicare.gov Compare Post Acute Care list provided to:: Patient Choice offered to / list presented to : Patient, Adult Children  Discharge Placement              Patient [facility] bed at: [facility] Patient to be transferred to facility by: [place] Name of family member notified: Eilleen Kempf [phone] Patient and family notified of of transfer: 04/01/22  Discharge Plan and Services In-house Referral: Clinical Social Work   Post Acute Care Choice: [place]          DME Arranged: N/A DME Agency: NA       HH Arranged: NA HH Agency: NA        Social Determinants of Health (SDOH) Interventions Food Insecurity Interventions: Patient Refused   Readmission Risk Interventions     No data to display

## 2022-04-01 NOTE — Plan of Care (Signed)
Plan of care reviewed and discussed. °

## 2022-04-01 NOTE — Progress Notes (Signed)
Granddaughter Magda Paganini called and aware that pt has been picked up to go to SNF.

## 2022-04-01 NOTE — Progress Notes (Addendum)
Report called to Tyler at [facility].  Granddaughter Magda Paganini informed that pt is scheduled for pick up at 2pm. Will call Leanne Chang once Corey Harold has arrived. [phone]

## 2022-04-05 DIAGNOSIS — E039 Hypothyroidism, unspecified: Secondary | ICD-10-CM | POA: Diagnosis not present

## 2022-04-05 DIAGNOSIS — I5040 Unspecified combined systolic (congestive) and diastolic (congestive) heart failure: Secondary | ICD-10-CM | POA: Diagnosis not present

## 2022-04-05 DIAGNOSIS — R296 Repeated falls: Secondary | ICD-10-CM | POA: Diagnosis not present

## 2022-04-05 DIAGNOSIS — N189 Chronic kidney disease, unspecified: Secondary | ICD-10-CM | POA: Diagnosis not present

## 2022-04-05 DIAGNOSIS — L03115 Cellulitis of right lower limb: Secondary | ICD-10-CM | POA: Diagnosis not present

## 2022-04-05 DIAGNOSIS — I10 Essential (primary) hypertension: Secondary | ICD-10-CM | POA: Diagnosis not present

## 2022-04-08 DIAGNOSIS — I5040 Unspecified combined systolic (congestive) and diastolic (congestive) heart failure: Secondary | ICD-10-CM | POA: Diagnosis not present

## 2022-04-08 DIAGNOSIS — R296 Repeated falls: Secondary | ICD-10-CM | POA: Diagnosis not present

## 2022-04-08 DIAGNOSIS — E039 Hypothyroidism, unspecified: Secondary | ICD-10-CM | POA: Diagnosis not present

## 2022-04-08 DIAGNOSIS — U071 COVID-19: Secondary | ICD-10-CM | POA: Diagnosis not present

## 2022-04-12 DIAGNOSIS — U071 COVID-19: Secondary | ICD-10-CM | POA: Diagnosis not present

## 2022-04-15 DIAGNOSIS — Z515 Encounter for palliative care: Secondary | ICD-10-CM | POA: Diagnosis not present

## 2022-04-15 DIAGNOSIS — I071 Rheumatic tricuspid insufficiency: Secondary | ICD-10-CM | POA: Diagnosis not present

## 2022-04-15 DIAGNOSIS — Z66 Do not resuscitate: Secondary | ICD-10-CM | POA: Diagnosis not present

## 2022-04-15 DIAGNOSIS — I5042 Chronic combined systolic (congestive) and diastolic (congestive) heart failure: Secondary | ICD-10-CM | POA: Diagnosis not present

## 2022-04-15 DIAGNOSIS — I517 Cardiomegaly: Secondary | ICD-10-CM | POA: Diagnosis not present

## 2022-04-17 DIAGNOSIS — Y998 Other external cause status: Secondary | ICD-10-CM | POA: Diagnosis not present

## 2022-04-17 DIAGNOSIS — M7989 Other specified soft tissue disorders: Secondary | ICD-10-CM | POA: Diagnosis not present

## 2022-04-17 DIAGNOSIS — S61411A Laceration without foreign body of right hand, initial encounter: Secondary | ICD-10-CM | POA: Diagnosis not present

## 2022-04-17 DIAGNOSIS — S0993XA Unspecified injury of face, initial encounter: Secondary | ICD-10-CM | POA: Diagnosis not present

## 2022-04-17 DIAGNOSIS — Z7901 Long term (current) use of anticoagulants: Secondary | ICD-10-CM | POA: Diagnosis not present

## 2022-04-17 DIAGNOSIS — R944 Abnormal results of kidney function studies: Secondary | ICD-10-CM | POA: Diagnosis not present

## 2022-04-17 DIAGNOSIS — S0181XA Laceration without foreign body of other part of head, initial encounter: Secondary | ICD-10-CM | POA: Diagnosis not present

## 2022-04-17 DIAGNOSIS — W19XXXD Unspecified fall, subsequent encounter: Secondary | ICD-10-CM | POA: Diagnosis not present

## 2022-04-17 DIAGNOSIS — S61206A Unspecified open wound of right little finger without damage to nail, initial encounter: Secondary | ICD-10-CM | POA: Diagnosis not present

## 2022-04-17 DIAGNOSIS — M79641 Pain in right hand: Secondary | ICD-10-CM | POA: Diagnosis not present

## 2022-04-17 DIAGNOSIS — Z043 Encounter for examination and observation following other accident: Secondary | ICD-10-CM | POA: Diagnosis not present

## 2022-04-17 DIAGNOSIS — M25522 Pain in left elbow: Secondary | ICD-10-CM | POA: Diagnosis not present

## 2022-04-17 DIAGNOSIS — S0990XA Unspecified injury of head, initial encounter: Secondary | ICD-10-CM | POA: Diagnosis not present

## 2022-04-17 DIAGNOSIS — Z23 Encounter for immunization: Secondary | ICD-10-CM | POA: Diagnosis not present

## 2022-04-17 DIAGNOSIS — S61219A Laceration without foreign body of unspecified finger without damage to nail, initial encounter: Secondary | ICD-10-CM | POA: Diagnosis not present

## 2022-04-17 DIAGNOSIS — W010XXA Fall on same level from slipping, tripping and stumbling without subsequent striking against object, initial encounter: Secondary | ICD-10-CM | POA: Diagnosis not present

## 2022-04-17 DIAGNOSIS — R791 Abnormal coagulation profile: Secondary | ICD-10-CM | POA: Diagnosis not present

## 2022-04-17 DIAGNOSIS — S51811A Laceration without foreign body of right forearm, initial encounter: Secondary | ICD-10-CM | POA: Diagnosis not present

## 2022-04-25 DIAGNOSIS — I110 Hypertensive heart disease with heart failure: Secondary | ICD-10-CM | POA: Diagnosis not present

## 2022-04-25 DIAGNOSIS — I5042 Chronic combined systolic (congestive) and diastolic (congestive) heart failure: Secondary | ICD-10-CM | POA: Diagnosis not present

## 2022-04-25 DIAGNOSIS — I480 Paroxysmal atrial fibrillation: Secondary | ICD-10-CM | POA: Diagnosis not present

## 2022-04-25 DIAGNOSIS — R296 Repeated falls: Secondary | ICD-10-CM | POA: Diagnosis not present

## 2022-04-25 DIAGNOSIS — E039 Hypothyroidism, unspecified: Secondary | ICD-10-CM | POA: Diagnosis not present

## 2022-04-25 DIAGNOSIS — R2681 Unsteadiness on feet: Secondary | ICD-10-CM | POA: Diagnosis not present

## 2022-04-25 DIAGNOSIS — R600 Localized edema: Secondary | ICD-10-CM | POA: Diagnosis not present

## 2022-04-26 DIAGNOSIS — S61214A Laceration without foreign body of right ring finger without damage to nail, initial encounter: Secondary | ICD-10-CM | POA: Diagnosis not present

## 2022-04-29 ENCOUNTER — Ambulatory Visit: Payer: PPO | Admitting: Cardiology

## 2022-05-06 ENCOUNTER — Other Ambulatory Visit: Payer: Self-pay | Admitting: Cardiology

## 2022-05-06 DIAGNOSIS — Z515 Encounter for palliative care: Secondary | ICD-10-CM | POA: Diagnosis not present

## 2022-05-06 DIAGNOSIS — Z681 Body mass index (BMI) 19 or less, adult: Secondary | ICD-10-CM | POA: Diagnosis not present

## 2022-05-06 DIAGNOSIS — I5020 Unspecified systolic (congestive) heart failure: Secondary | ICD-10-CM | POA: Diagnosis not present

## 2022-05-17 ENCOUNTER — Ambulatory Visit: Payer: PPO | Admitting: Podiatry

## 2022-05-24 ENCOUNTER — Encounter: Payer: Self-pay | Admitting: Cardiology

## 2022-05-30 NOTE — Telephone Encounter (Signed)
error 

## 2022-06-06 ENCOUNTER — Encounter: Payer: Self-pay | Admitting: Cardiology

## 2022-06-06 ENCOUNTER — Ambulatory Visit: Payer: PPO | Admitting: Cardiology

## 2022-06-06 VITALS — BP 116/68 | HR 73 | Resp 16 | Ht 64.0 in | Wt 106.2 lb

## 2022-06-06 DIAGNOSIS — I5022 Chronic systolic (congestive) heart failure: Secondary | ICD-10-CM | POA: Diagnosis not present

## 2022-06-06 DIAGNOSIS — I951 Orthostatic hypotension: Secondary | ICD-10-CM | POA: Diagnosis not present

## 2022-06-06 DIAGNOSIS — I10 Essential (primary) hypertension: Secondary | ICD-10-CM | POA: Diagnosis not present

## 2022-06-06 DIAGNOSIS — I480 Paroxysmal atrial fibrillation: Secondary | ICD-10-CM

## 2022-06-06 NOTE — Progress Notes (Signed)
Primary Physician/Referring:  [name], MD  Patient ID: Heidi Adams, female    DOB: 1935-08-03, 86 y.o.   MRN: [id number]  Chief Complaint  Patient presents with   Congestive Heart Failure   Follow-up    3 month    HPI:    Heidi Adams  is a 86 y.o. female  with bilateral hip replacement and knee replacement and left breast lumpectomy in 2018 without chemotherapy or radiation therapy for breast cancer and also remote hysterectomy due to uterine cancer in 1991, supine hypertension and orthostatic hypotension, paroxysmal atrial fibrillation on chronic Eliquis, chronic dizziness. Has not been able to tolerate any BP medications due to severe dizzy spells.  Remote GI bleed in 2021 with no recurrence.  She is presently doing well from cardiac standpoint and has not had any recent hospitalization due to heart failure.  Social History   Tobacco Use   Smoking status: Former    Packs/day: 1.00    Years: 20.00    Total pack years: 20.00    Types: Cigarettes    Quit date: 07/26/1987    Years since quitting: 34.8   Smokeless tobacco: Never  Substance Use Topics   Alcohol use: No    Alcohol/week: 0.0 standard drinks of alcohol  Marital Status: Married   ROS   Review of Systems  Cardiovascular:  Positive for dyspnea on exertion (improved) and leg swelling (intermittent, stable and mild). Negative for chest pain, orthopnea, palpitations and syncope.   Objective  Blood pressure 116/68, pulse 73, resp. rate 16, height '5\' 4"'$  (1.626 m), weight 106 lb 3.2 oz (48.2 kg), last menstrual period 07/25/1988, SpO2 96 %.     06/06/2022   10:46 AM 04/01/2022    1:59 PM 04/01/2022    5:22 AM  Vitals with BMI  Height '5\' 4"'$     Weight 106 lbs 3 oz  125 lbs 11 oz  BMI 18.29  93.71  Systolic 696 789 381  Diastolic 68 77 81  Pulse 73 65 62    Orthostatic VS for the past 72 hrs (Last 3 readings):  Orthostatic BP Patient Position BP Location Cuff Size  06/06/22 1135 116/60 Standing Left Arm --   06/06/22 1133 168/80 Sitting Left Arm --  06/06/22 1046 -- Standing Left Arm Normal    Physical Exam Vitals reviewed.  Constitutional:      Comments: Moderately build, frail  Neck:     Vascular: No carotid bruit or JVD.  Cardiovascular:     Rate and Rhythm: Normal rate and regular rhythm.     Pulses: Intact distal pulses.          Radial pulses are 2+ on the right side and 2+ on the left side.       Dorsalis pedis pulses are 1+ on the right side and 1+ on the left side.       Posterior tibial pulses are 1+ on the right side and 1+ on the left side.     Heart sounds: S1 normal and S2 normal. Murmur heard.     Blowing midsystolic murmur is present with a grade of 2/6 at the apex.     Blowing decrescendo early diastolic murmur is present with a grade of 3/4 radiating to the apex.     No gallop.  Pulmonary:     Effort: No accessory muscle usage or respiratory distress.     Breath sounds: Examination of the right-lower field reveals rhonchi. Examination of the left-lower field  reveals rhonchi. Rhonchi (bibasilar coarse crackles, right worse, at the bases) present.  Abdominal:     General: Bowel sounds are normal.     Palpations: Abdomen is soft.  Musculoskeletal:     Right lower leg: No edema.     Left lower leg: No edema.    Laboratory examination:   Recent Labs    03/30/22 0402 03/31/22 0405 04/01/22 0407  NA 139 136 138  K 4.0 3.6 3.0*  CL 104 100 101  CO2 '29 26 29  '$ GLUCOSE 93 93 92  BUN 25* 27* 24*  CREATININE 1.06* 1.14* 1.11*  CALCIUM 8.3* 8.2* 7.9*  GFRNONAA 52* 48* 49*   CrCl cannot be calculated (Patient's most recent lab result is older than the maximum 21 days allowed.).     Latest Ref Rng & Units 04/01/2022    4:07 AM 03/31/2022    4:05 AM 03/30/2022    4:02 AM  CMP  Glucose 70 - 99 mg/dL 92  93  93   BUN 8 - 23 mg/dL '24  27  25   '$ Creatinine 0.44 - 1.00 mg/dL 1.11  1.14  1.06   Sodium 135 - 145 mmol/L 138  136  139   Potassium 3.5 - 5.1 mmol/L 3.0  3.6   4.0   Chloride 98 - 111 mmol/L 101  100  104   CO2 22 - 32 mmol/L '29  26  29   '$ Calcium 8.9 - 10.3 mg/dL 7.9  8.2  8.3       Latest Ref Rng & Units 03/30/2022    4:02 AM 03/29/2022    8:07 PM 03/29/2022    4:38 AM  CBC  WBC 4.0 - 10.5 K/uL 5.4  7.3  5.0   Hemoglobin 12.0 - 15.0 g/dL 11.3  11.8  11.9   Hematocrit 36.0 - 46.0 % 37.4  39.2  39.7   Platelets 150 - 400 K/uL 147  178  174     TSH Recent Labs    07/27/21 1016 03/28/22 0413  TSH 17.200* 9.891*   BNP (last 3 results) Recent Labs    03/06/22 0859 03/27/22 1440 03/29/22 0438  BNP 2,638.9* 2,134.0* 1,987.9*   External labs:   Lipid Panel completed 04/09/2019 HDL 38 MG/DL 04/09/2019 LDL 80.000 mg 04/09/2019 Cholesterol, total 143.000 m 04/09/2019 Triglycerides 127.000 04/09/2019  A1C 5.000 07/31/2018  Glucose Random 110.000 m 11/04/2019 MicroAlbumin Urine 9.000 04/25/2019 MicroAlbumin/Creat 16.8 MG/ 04/25/2019 BUN 18.000 mg 11/04/2019 Creatinine, Serum 0.700 mg/ 11/04/2019  TSH 2.620 micr 11/04/2019  Allergies   Allergies  Allergen Reactions   Codeine Nausea Only   Epinephrine     Tachcardyia, chest pains tremors   Hydrocodone-Acetaminophen Other (See Comments)    Other reaction(s): sick on stomach per patient   Tenormin [Atenolol] Rash     Final Medications at End of Visit     Current Outpatient Medications:    acetaminophen (TYLENOL) 500 MG tablet, Take 1,000 mg by mouth 3 (three) times daily as needed for moderate pain. , Disp: , Rfl:    amiodarone (PACERONE) 200 MG tablet, Take 0.5 tablets (100 mg total) by mouth daily., Disp: 90 tablet, Rfl: 3   amLODipine (NORVASC) 2.5 MG tablet, Take 1 tablet (2.5 mg total) by mouth 2 (two) times daily., Disp: , Rfl:    ELIQUIS 2.5 MG TABS tablet, TAKE 1 TABLET BY MOUTH 2 TIMES A DAY (Patient taking differently: Take 2.5 mg by mouth 2 (two) times daily.), Disp: 60 tablet, Rfl: 3  furosemide (LASIX) 40 MG tablet, Take 1 tablet (40 mg total) by mouth daily. (Patient taking  differently: Take 20 mg by mouth daily.), Disp: 30 tablet, Rfl:    levothyroxine (SYNTHROID) 125 MCG tablet, Take 125 mcg by mouth daily., Disp: , Rfl:    losartan (COZAAR) 25 MG tablet, Take 1 tablet (25 mg total) by mouth daily., Disp: , Rfl:    metoprolol succinate (TOPROL-XL) 25 MG 24 hr tablet, Take 1 tablet (25 mg total) by mouth daily., Disp: , Rfl:    pantoprazole (PROTONIX) 40 MG tablet, Take 40 mg by mouth daily as needed (indigestion/heartburn.). , Disp: , Rfl:    potassium chloride (KLOR-CON M) 20 MEQ tablet, Take 1 tablet (20 mEq total) by mouth daily. Take 2 tablets( 40 meq) for next 3 days then continue taking once daily (Patient taking differently: Take 10 mEq by mouth daily. Take 2 tablets( 40 meq) for next 3 days then continue taking once daily), Disp: , Rfl:    senna-docusate (SENOKOT-S) 8.6-50 MG tablet, Take 1 tablet by mouth at bedtime as needed for mild constipation., Disp: , Rfl:     Radiology:   No results found.  Cardiac Studies:    Lexiscan Myoview stress test  04/23/2018: 1. The resting electrocardiogram demonstrated normal sinus rhythm, normal resting conduction and no resting arrhythmias.  Stress EKG is non-diagnostic for ischemia as it a pharmacologic stress using Lexiscan. Occasional PAC and PVC. The patient developed significant symptoms which included Chest Pain, Stomach pain, Headache.  2. The overall quality of the study is good. There is no evidence of abnormal lung activity. Stress and rest SPECT images demonstrate homogeneous tracer distribution throughout the myocardium. Gated SPECT imaging reveals diffuse global hypokinesis. The left ventricular ejection fraction was markedly depressed at (26%).   3. This is a high risk study due to severe LV systolic dysfunction. Findings may represent non ischemic cardiomyopathy.  PCV ECHOCARDIOGRAM COMPLETE 12/30/2021  Narrative Echocardiogram 12/30/2021: Severely depressed LV systolic function with visual EF <20%.  Left ventricle cavity is dilated. Moderate left ventricular hypertrophy. Hypokinetic global wall motion. Doppler evidence of grade III (restrictive) diastolic dysfunction, elevated LAP. Left atrial cavity is severely dilated. Native trileaflet aortic valve.  Moderate (Grade II) aortic regurgitation. Aortic valve sclerosis without stenosis. Moderate (Grade II) mitral regurgitation. Mild tricuspid regurgitation. No evidence of pulmonary hypertension. RVSP measures 31 mmHg. Mild to moderate pulmonic regurgitation. Pericardium is normal. Insignificant pericardial effusion. There is no hemodynamic significance. IVC is dilated with a respiratory response of <50%. Compared to 03/04/2021 no significant change.    EKG   EKG 03/27/2022: Normal sinus rhythm at rate of 85 bpm, left axis deviation, left anterior fascicular block.  Incomplete right bundle branch block.  Anteroseptal infarct old.  Nonspecific T abnormality.  Compared to 01/26/2022, no significant change.   Assessment     ICD-10-CM   1. Chronic systolic heart failure (HCC)  I50.22 PCV ECHOCARDIOGRAM COMPLETE    2. AF (paroxysmal atrial fibrillation) (HCC)  I48.0     3. Orthostatic hypotension  I95.1     4. Primary hypertension  I10       CHA2DS2-VASc Score is 5.  Yearly risk of stroke: 7.2% (A, F, HTN, CHF).  Score of 1=0.6; 2=2.2; 3=3.2; 4=4.8; 5=7.2; 6=9.8; 7=>9.8) -(CHF; HTN; vasc disease DM,  Female = 1; Age <65 =0; 65-74 = 1,  >75 =2; stroke/embolism= 2).   No orders of the defined types were placed in this encounter.    Medications Discontinued During  This Encounter  Medication Reason   ipratropium-albuterol (DUONEB) 0.5-2.5 (3) MG/3ML SOLN No longer needed (for PRN medications)     Orders Placed This Encounter  Procedures   PCV ECHOCARDIOGRAM COMPLETE    Standing Status:   Future    Standing Expiration Date:   06/07/2023   Recommendations:   Heidi Adams  is a 86 y.o. female  with bilateral hip replacement and  knee replacement and left breast lumpectomy in 2018 without chemotherapy or radiation therapy for breast cancer and also remote hysterectomy due to uterine cancer in 1991, supine hypertension and orthostatic hypotension, paroxysmal atrial fibrillation on chronic Eliquis, chronic dizziness. Has not been able to tolerate any BP medications due to severe dizzy spells.  Remote GI bleed in 2021 with no recurrence.  Chronic systolic heart failure ([place]) Patient is presently very well compensated.  Lungs are clear, she has no leg edema and no JVD.  Continue present medical therapy.  Suspecting that her LVEF may have improved.  We will repeat echocardiogram.  Mitral regurgitation murmur is also less prominent.  No change in aortic regurgitation murmur.  She is taking furosemide on a daily basis.  AF (paroxysmal atrial fibrillation) (HCC) Presently maintained sinus rhythm.  Presently on anticoagulation.  CBC has remained stable.  Orthostatic hypotension She is severely orthostatic.  She is presently on very low-dose of amlodipine at 2.5 mg daily, I discussed with her that we could certainly discontinue this however supine hypertension would be a major problem as well.  She is willing to continue this for now.  Primary hypertension As dictated above, blood pressure is well controlled on standing position.  We will treat only orthostatic blood pressure.  Office visit in 3 months or sooner if problems.    Adrian Prows, MD, [facility] 06/06/2022, 11:45 AM Office: [phone] Fax: [phone] Pager: [phone]

## 2022-06-10 ENCOUNTER — Other Ambulatory Visit: Payer: Self-pay | Admitting: Cardiology

## 2022-06-10 ENCOUNTER — Ambulatory Visit: Payer: PPO | Admitting: Podiatry

## 2022-06-13 DIAGNOSIS — E039 Hypothyroidism, unspecified: Secondary | ICD-10-CM | POA: Diagnosis not present

## 2022-06-13 DIAGNOSIS — I10 Essential (primary) hypertension: Secondary | ICD-10-CM | POA: Diagnosis not present

## 2022-06-13 DIAGNOSIS — R7989 Other specified abnormal findings of blood chemistry: Secondary | ICD-10-CM | POA: Diagnosis not present

## 2022-06-13 DIAGNOSIS — D649 Anemia, unspecified: Secondary | ICD-10-CM | POA: Diagnosis not present

## 2022-06-13 DIAGNOSIS — E785 Hyperlipidemia, unspecified: Secondary | ICD-10-CM | POA: Diagnosis not present

## 2022-06-20 ENCOUNTER — Telehealth: Payer: Self-pay

## 2022-06-20 ENCOUNTER — Other Ambulatory Visit: Payer: Self-pay

## 2022-06-20 DIAGNOSIS — C50919 Malignant neoplasm of unspecified site of unspecified female breast: Secondary | ICD-10-CM | POA: Diagnosis not present

## 2022-06-20 DIAGNOSIS — I110 Hypertensive heart disease with heart failure: Secondary | ICD-10-CM | POA: Diagnosis not present

## 2022-06-20 DIAGNOSIS — I5042 Chronic combined systolic (congestive) and diastolic (congestive) heart failure: Secondary | ICD-10-CM | POA: Diagnosis not present

## 2022-06-20 DIAGNOSIS — R600 Localized edema: Secondary | ICD-10-CM | POA: Diagnosis not present

## 2022-06-20 DIAGNOSIS — I420 Dilated cardiomyopathy: Secondary | ICD-10-CM | POA: Diagnosis not present

## 2022-06-20 DIAGNOSIS — Z1331 Encounter for screening for depression: Secondary | ICD-10-CM | POA: Diagnosis not present

## 2022-06-20 DIAGNOSIS — R82998 Other abnormal findings in urine: Secondary | ICD-10-CM | POA: Diagnosis not present

## 2022-06-20 DIAGNOSIS — E440 Moderate protein-calorie malnutrition: Secondary | ICD-10-CM | POA: Diagnosis not present

## 2022-06-20 DIAGNOSIS — I872 Venous insufficiency (chronic) (peripheral): Secondary | ICD-10-CM | POA: Diagnosis not present

## 2022-06-20 DIAGNOSIS — I480 Paroxysmal atrial fibrillation: Secondary | ICD-10-CM

## 2022-06-20 DIAGNOSIS — R634 Abnormal weight loss: Secondary | ICD-10-CM | POA: Diagnosis not present

## 2022-06-20 DIAGNOSIS — R2681 Unsteadiness on feet: Secondary | ICD-10-CM | POA: Diagnosis not present

## 2022-06-20 DIAGNOSIS — Z7901 Long term (current) use of anticoagulants: Secondary | ICD-10-CM | POA: Diagnosis not present

## 2022-06-20 DIAGNOSIS — I10 Essential (primary) hypertension: Secondary | ICD-10-CM | POA: Diagnosis not present

## 2022-06-20 DIAGNOSIS — Z0000 Encounter for general adult medical examination without abnormal findings: Secondary | ICD-10-CM | POA: Diagnosis not present

## 2022-06-20 MED ORDER — METOPROLOL SUCCINATE ER 25 MG PO TB24: 25.0000 mg | ORAL_TABLET | Freq: Every day | ORAL | 1 refills | Status: DC

## 2022-06-20 MED ORDER — AMLODIPINE BESYLATE 2.5 MG PO TABS: 2.5000 mg | ORAL_TABLET | Freq: Two times a day (BID) | ORAL | 1 refills | Status: DC

## 2022-06-20 MED ORDER — APIXABAN 2.5 MG PO TABS: 2.5000 mg | ORAL_TABLET | Freq: Two times a day (BID) | ORAL | 1 refills | Status: DC

## 2022-06-20 MED ORDER — AMIODARONE HCL 200 MG PO TABS: 100.0000 mg | ORAL_TABLET | Freq: Every day | ORAL | 1 refills | Status: DC

## 2022-06-20 NOTE — Telephone Encounter (Signed)
We need to send all the medications to [facility] except Lasix

## 2022-06-20 NOTE — Telephone Encounter (Signed)
Patient called and said you mentioned switching her pharmacy to a pharmacy that packages the meds?

## 2022-06-20 NOTE — Telephone Encounter (Signed)
Done

## 2022-07-26 DIAGNOSIS — L821 Other seborrheic keratosis: Secondary | ICD-10-CM | POA: Diagnosis not present

## 2022-07-26 DIAGNOSIS — Z85828 Personal history of other malignant neoplasm of skin: Secondary | ICD-10-CM | POA: Diagnosis not present

## 2022-07-26 DIAGNOSIS — L308 Other specified dermatitis: Secondary | ICD-10-CM | POA: Diagnosis not present

## 2022-07-26 DIAGNOSIS — L570 Actinic keratosis: Secondary | ICD-10-CM | POA: Diagnosis not present

## 2022-08-04 ENCOUNTER — Ambulatory Visit: Payer: PPO

## 2022-08-04 DIAGNOSIS — I5022 Chronic systolic (congestive) heart failure: Secondary | ICD-10-CM | POA: Diagnosis not present

## 2022-08-05 ENCOUNTER — Other Ambulatory Visit: Payer: Self-pay | Admitting: Internal Medicine

## 2022-08-05 DIAGNOSIS — Z1231 Encounter for screening mammogram for malignant neoplasm of breast: Secondary | ICD-10-CM

## 2022-08-09 ENCOUNTER — Other Ambulatory Visit: Payer: Self-pay | Admitting: Cardiology

## 2022-08-10 ENCOUNTER — Other Ambulatory Visit: Payer: Self-pay | Admitting: Internal Medicine

## 2022-08-10 DIAGNOSIS — N63 Unspecified lump in unspecified breast: Secondary | ICD-10-CM

## 2022-08-24 ENCOUNTER — Ambulatory Visit
Admission: RE | Admit: 2022-08-24 | Discharge: 2022-08-24 | Disposition: A | Discharge status: Home / Self Care | Payer: PPO | Source: Ambulatory Visit | Attending: Internal Medicine | Admitting: Internal Medicine

## 2022-08-24 ENCOUNTER — Ambulatory Visit: Payer: PPO

## 2022-08-24 DIAGNOSIS — Z853 Personal history of malignant neoplasm of breast: Secondary | ICD-10-CM | POA: Diagnosis not present

## 2022-08-24 DIAGNOSIS — N63 Unspecified lump in unspecified breast: Secondary | ICD-10-CM

## 2022-08-24 DIAGNOSIS — R922 Inconclusive mammogram: Secondary | ICD-10-CM | POA: Diagnosis not present

## 2022-08-24 DIAGNOSIS — N6489 Other specified disorders of breast: Secondary | ICD-10-CM | POA: Diagnosis not present

## 2022-08-31 ENCOUNTER — Ambulatory Visit: Payer: PPO | Admitting: Cardiology

## 2022-08-31 ENCOUNTER — Encounter: Payer: Self-pay | Admitting: Cardiology

## 2022-08-31 VITALS — BP 140/74 | HR 71 | Resp 17 | Ht 64.0 in | Wt 113.0 lb

## 2022-08-31 DIAGNOSIS — I951 Orthostatic hypotension: Secondary | ICD-10-CM

## 2022-08-31 DIAGNOSIS — I10 Essential (primary) hypertension: Secondary | ICD-10-CM

## 2022-08-31 DIAGNOSIS — I480 Paroxysmal atrial fibrillation: Secondary | ICD-10-CM | POA: Diagnosis not present

## 2022-08-31 DIAGNOSIS — I428 Other cardiomyopathies: Secondary | ICD-10-CM

## 2022-08-31 DIAGNOSIS — I5022 Chronic systolic (congestive) heart failure: Secondary | ICD-10-CM | POA: Diagnosis not present

## 2022-08-31 NOTE — Progress Notes (Signed)
Primary Physician/Referring:  [name], MD  Patient ID: Heidi Adams, female    DOB: [birth year], 87 y.o.   MRN: [id number]  Chief Complaint  Patient presents with   Congestive Heart Failure   Atrial Fibrillation   Hypertension   Follow-up    3 month    HPI:    Heidi Adams  is a 87 y.o. female  with bilateral hip replacement and knee replacement and left breast lumpectomy in 2018 without chemotherapy or radiation therapy for breast cancer and also remote hysterectomy due to uterine cancer in 1991, supine hypertension and orthostatic hypotension, paroxysmal atrial fibrillation on chronic Eliquis, chronic dizziness. Has not been able to tolerate any BP medications due to severe dizzy spells.  Remote GI bleed in 2021 with no recurrence.  She is presently doing well from cardiac standpoint and has not had any recent hospitalization due to heart failure.  Social History   Tobacco Use   Smoking status: Former    Packs/day: 1.00    Years: 20.00    Total pack years: 20.00    Types: Cigarettes    Quit date: 07/26/1987    Years since quitting: 35.1   Smokeless tobacco: Never  Substance Use Topics   Alcohol use: No    Alcohol/week: 0.0 standard drinks of alcohol  Marital Status: Married   ROS   Review of Systems  Cardiovascular:  Positive for leg swelling (intermittent, stable and mild). Negative for chest pain, dyspnea on exertion, orthopnea and syncope.  Neurological:  Positive for light-headedness.   Objective  Blood pressure (!) 140/74, pulse 71, resp. rate 17, height '5\' 4"'$  (1.626 m), weight 113 lb (51.3 kg), last menstrual period 07/25/1988, SpO2 96 %.     08/31/2022    9:34 AM 06/06/2022   10:46 AM 04/01/2022    1:59 PM  Vitals with BMI  Height '5\' 4"'$  '5\' 4"'$    Weight 113 lbs 106 lbs 3 oz   BMI 25.42 70.62   Systolic 376 283 151  Diastolic 74 68 77  Pulse 71 73 65    Orthostatic VS for the past 72 hrs (Last 3 readings):  Orthostatic BP Patient Position BP Location  Cuff Size Orthostatic Pulse  08/31/22 0946 151/65 Standing Left Arm Normal 72  08/31/22 0945 162/79 Sitting Left Arm Normal 69  08/31/22 0943 (!) 208/90 Supine Left Arm Normal 71    Physical Exam Vitals reviewed.  Constitutional:      Comments: Moderately build, frail  Neck:     Vascular: No carotid bruit or JVD.  Cardiovascular:     Rate and Rhythm: Normal rate and regular rhythm.     Pulses: Intact distal pulses.          Radial pulses are 2+ on the right side and 2+ on the left side.       Dorsalis pedis pulses are 1+ on the right side and 1+ on the left side.       Posterior tibial pulses are 1+ on the right side and 1+ on the left side.     Heart sounds: S1 normal and S2 normal. Murmur heard.     Blowing midsystolic murmur is present with a grade of 2/6 at the apex.     Blowing decrescendo early diastolic murmur is present with a grade of 3/4 radiating to the apex.     No gallop.  Pulmonary:     Effort: Pulmonary effort is normal. No accessory muscle usage.  Breath sounds: Normal breath sounds.  Abdominal:     General: Bowel sounds are normal.     Palpations: Abdomen is soft.  Musculoskeletal:     Right lower leg: No edema.     Left lower leg: No edema.    Laboratory examination:   Recent Labs    03/30/22 0402 03/31/22 0405 04/01/22 0407  NA 139 136 138  K 4.0 3.6 3.0*  CL 104 100 101  CO2 '29 26 29  '$ GLUCOSE 93 93 92  BUN 25* 27* 24*  CREATININE 1.06* 1.14* 1.11*  CALCIUM 8.3* 8.2* 7.9*  GFRNONAA 52* 48* 49*      Latest Ref Rng & Units 04/01/2022    4:07 AM 03/31/2022    4:05 AM 03/30/2022    4:02 AM  CMP  Glucose 70 - 99 mg/dL 92  93  93   BUN 8 - 23 mg/dL '24  27  25   '$ Creatinine 0.44 - 1.00 mg/dL 1.11  1.14  1.06   Sodium 135 - 145 mmol/L 138  136  139   Potassium 3.5 - 5.1 mmol/L 3.0  3.6  4.0   Chloride 98 - 111 mmol/L 101  100  104   CO2 22 - 32 mmol/L '29  26  29   '$ Calcium 8.9 - 10.3 mg/dL 7.9  8.2  8.3       Latest Ref Rng & Units 03/30/2022     4:02 AM 03/29/2022    8:07 PM 03/29/2022    4:38 AM  CBC  WBC 4.0 - 10.5 K/uL 5.4  7.3  5.0   Hemoglobin 12.0 - 15.0 g/dL 11.3  11.8  11.9   Hematocrit 36.0 - 46.0 % 37.4  39.2  39.7   Platelets 150 - 400 K/uL 147  178  174     TSH Recent Labs    03/28/22 0413  TSH 9.891*   BNP (last 3 results) Recent Labs    03/06/22 0859 03/27/22 1440 03/29/22 0438  BNP 2,638.9* 2,134.0* 1,987.9*   Lab Results  Component Value Date   CHOL 142 07/31/2018   HDL 45 07/31/2018   LDLCALC 83 07/31/2018   TRIG 72 07/31/2018   CHOLHDL 3.2 07/31/2018    Lab Results  Component Value Date   TSH 9.891 (H) 03/28/2022    Allergies   Allergies  Allergen Reactions   Codeine Nausea Only   Epinephrine     Tachcardyia, chest pains tremors   Hydrocodone-Acetaminophen Other (See Comments)    Other reaction(s): sick on stomach per patient   Tenormin [Atenolol] Rash     Final Medications at End of Visit     Current Outpatient Medications:    acetaminophen (TYLENOL) 500 MG tablet, Take 1,000 mg by mouth 3 (three) times daily as needed for moderate pain. , Disp: , Rfl:    amiodarone (PACERONE) 200 MG tablet, Take 0.5 tablets (100 mg total) by mouth daily., Disp: 45 tablet, Rfl: 1   amLODipine (NORVASC) 2.5 MG tablet, Take 1 tablet (2.5 mg total) by mouth 2 (two) times daily., Disp: 180 tablet, Rfl: 1   amoxicillin (AMOXIL) 250 MG capsule, Take 250 mg by mouth 3 (three) times daily. Patient is talking as needed only, Disp: , Rfl:    apixaban (ELIQUIS) 2.5 MG TABS tablet, TAKE 1 TABLET BY MOUTH 2 TIMES A DAY, Disp: 180 tablet, Rfl: 1   furosemide (LASIX) 40 MG tablet, Take 1 tablet (40 mg total) by mouth daily. (Patient taking differently: Take  20 mg by mouth as needed.), Disp: 30 tablet, Rfl:    levothyroxine (SYNTHROID) 125 MCG tablet, Take 125 mcg by mouth daily., Disp: , Rfl:    losartan (COZAAR) 25 MG tablet, Take 1 tablet (25 mg total) by mouth daily., Disp: , Rfl:    metoprolol succinate  (TOPROL-XL) 25 MG 24 hr tablet, Take 1 tablet (25 mg total) by mouth daily., Disp: 90 tablet, Rfl: 1   pantoprazole (PROTONIX) 40 MG tablet, Take 40 mg by mouth daily as needed (indigestion/heartburn.). , Disp: , Rfl:    potassium chloride (KLOR-CON M) 20 MEQ tablet, Take 1 tablet (20 mEq total) by mouth daily. Take 2 tablets( 40 meq) for next 3 days then continue taking once daily (Patient taking differently: Take 10 mEq by mouth as needed.), Disp: , Rfl:    senna-docusate (SENOKOT-S) 8.6-50 MG tablet, Take 1 tablet by mouth at bedtime as needed for mild constipation. (Patient not taking: Reported on 08/31/2022), Disp: , Rfl:     Radiology:   No results found.  Cardiac Studies:    Lexiscan Myoview stress test  04/23/2018: 1. The resting electrocardiogram demonstrated normal sinus rhythm, normal resting conduction and no resting arrhythmias.  Stress EKG is non-diagnostic for ischemia as it a pharmacologic stress using Lexiscan. Occasional PAC and PVC. The patient developed significant symptoms which included Chest Pain, Stomach pain, Headache.  2. The overall quality of the study is good. There is no evidence of abnormal lung activity. Stress and rest SPECT images demonstrate homogeneous tracer distribution throughout the myocardium. Gated SPECT imaging reveals diffuse global hypokinesis. The left ventricular ejection fraction was markedly depressed at (26%).   3. This is a high risk study due to severe LV systolic dysfunction. Findings may represent non ischemic cardiomyopathy.  PCV ECHOCARDIOGRAM COMPLETE 08/04/2022  Narrative Echocardiogram 08/04/2022: Left ventricle cavity is mildly dilated. Normal left ventricular wall thickness. Severe global hypokinesis. LVEF 35-30%. Doppler evidence of grade II (pseudonormal) diastolic dysfunction, elevated LAP. Left atrial cavity is mildly dilated. Structurally normal trileaflet aortic valve.  Moderate (Grade II) aortic regurgitation. Mild tricuspid  regurgitation. No evidence of pulmonary hypertension. Previous study on 12/30/2021 reported LVEF <20%, grade III DD, moderate MR, trace pericardial effusion. estimated PASP 31 mmHg.   EKG   EKG 08/31/2022: Normal sinus rhythm at a rate of 68 bpm, left axis deviation, left anterior fascicular block.  Poor R progression, cannot exclude anteroseptal infarct old.  Borderline criteria for LVH.  Nonspecific T abnormality.  Compared to 01/26/2022, no significant change.   Assessment     ICD-10-CM   1. Chronic systolic heart failure (HCC)  I50.22 EKG 12-Lead    2. Nonischemic cardiomyopathy (HCC)  I42.8     3. Paroxysmal atrial fibrillation (HCC)  I48.0     4. Orthostatic hypotension  I95.1     5. Supine hypertension  I10       CHA2DS2-VASc Score is 5.  Yearly risk of stroke: 7.2% (A, F, HTN, CHF).  Score of 1=0.6; 2=2.2; 3=3.2; 4=4.8; 5=7.2; 6=9.8; 7=>9.8) -(CHF; HTN; vasc disease DM,  Female = 1; Age <65 =0; 65-74 = 1,  >75 =2; stroke/embolism= 2).   No orders of the defined types were placed in this encounter.    Medications Discontinued During This Encounter  Medication Reason   potassium chloride (KLOR-CON) 10 MEQ tablet Patient Preference     Orders Placed This Encounter  Procedures   EKG 12-Lead   Recommendations:   EMELIA SANDOVAL  is a 87 y.o.  female  with bilateral hip replacement and knee replacement and left breast lumpectomy in 2018 without chemotherapy or radiation therapy for breast cancer and also remote hysterectomy due to uterine cancer in 1991, supine hypertension and orthostatic hypotension, paroxysmal atrial fibrillation on chronic Eliquis, chronic dizziness. Has not been able to tolerate any BP medications due to severe dizzy spells.  Remote GI bleed in 2021 with no recurrence.  1. Chronic systolic heart failure [facility]) Patient with chronic systolic heart failure, she is presently doing the best she has in quite a while.  She has no JVD, no leg edema as she is wearing  support stockings regularly, lungs are very clear.  Her LVEF is also slightly improved compared to previous, presently 35% compared to <20% previous.  2. Nonischemic cardiomyopathy (HCC) Nonischemic cardiomyopathy again discussed with the patient.  She has no significant coronary disease.  Fortunately she is doing well clinically.  3. Paroxysmal atrial fibrillation (HCC) She is maintaining sinus rhythm and is presently on anticoagulation.  Continue the same.  4. Orthostatic hypotension She is severely orthostatic, dropping the blood pressure by about 50-60 points on standing.  She occasionally still gets dizziness with amlodipine but however advised her that patient balance between long-term effects of hypertension and orthostatic hypotension symptoms.  Advised her in the event that she is severely orthostatic and having dizziness, she can certainly hold the blood pressure medication.  Her husband is present.  5. Supine hypertension Supine hypertension discussed, patient sleeps reclined, no changes in the medications were done today.  I will see her back in 6 months or sooner if problems.     Adrian Prows, MD, [facility] 08/31/2022, 10:52 AM Office: [phone] Fax: [phone] Pager: [phone]

## 2022-09-01 ENCOUNTER — Ambulatory Visit: Payer: PPO | Admitting: Cardiology

## 2022-09-22 ENCOUNTER — Telehealth: Payer: Self-pay

## 2022-09-22 DIAGNOSIS — I480 Paroxysmal atrial fibrillation: Secondary | ICD-10-CM

## 2022-09-22 MED ORDER — AMIODARONE HCL 200 MG PO TABS: 100.0000 mg | ORAL_TABLET | Freq: Every day | ORAL | 1 refills | Status: DC

## 2022-09-22 NOTE — Telephone Encounter (Signed)
ICD-10-CM   1. Paroxysmal atrial fibrillation (HCC)  I48.0 amiodarone (PACERONE) 200 MG tablet     Meds ordered this encounter  Medications   amiodarone (PACERONE) 200 MG tablet    Sig: Take 0.5 tablets (100 mg total) by mouth daily.    Dispense:  45 tablet    Refill:  1

## 2022-09-22 NOTE — Telephone Encounter (Signed)
Can you send her amiodarone for 90 days to [facility]?

## 2022-10-03 ENCOUNTER — Ambulatory Visit: Payer: PPO | Admitting: Podiatry

## 2022-10-04 ENCOUNTER — Ambulatory Visit: Payer: PPO | Admitting: Podiatry

## 2022-10-25 ENCOUNTER — Ambulatory Visit: Payer: PPO | Admitting: Podiatry

## 2022-10-25 DIAGNOSIS — L600 Ingrowing nail: Secondary | ICD-10-CM

## 2022-10-25 DIAGNOSIS — L03032 Cellulitis of left toe: Secondary | ICD-10-CM

## 2022-10-25 DIAGNOSIS — M79676 Pain in unspecified toe(s): Secondary | ICD-10-CM

## 2022-10-25 NOTE — Patient Instructions (Signed)

## 2022-10-25 NOTE — Progress Notes (Signed)
Subjective:  Patient ID: Heidi Adams, female    DOB: 1936-04-27,  MRN: [id number]  Chief Complaint  Patient presents with   Ingrown Toenail    Patient came in today for left hallux medial border ingrown toenail, started few months ago, patient has had some  drainage     87 y.o. female presents with concern for left hallux medial border ingrown.  She does report having had the medial border removed in the past for ingrown.  It always comes back.  She does report some drainage from the area.  Does take Eliquis.  Past Medical History:  Diagnosis Date   Anemia    hx of anemia   Arthritis    Breast cancer ([place])    Left breast   Chronic combined systolic and diastolic CHF (congestive heart failure) ([place]) 09/22/2018   History of skin cancer    Hypertension    Hypothyroidism    PVC (premature ventricular contraction)     Allergies  Allergen Reactions   Codeine Nausea Only   Epinephrine     Tachcardyia, chest pains tremors   Hydrocodone-Acetaminophen Other (See Comments)    Other reaction(s): sick on stomach per patient   Tenormin [Atenolol] Rash    ROS: Negative except as per HPI above  Objective:  General: AAO x3, NAD  Dermatological: Incurvation is present along the medial nail border of the left great toe. There is localized edema without any erythema or increase in warmth around the nail border. There is no drainage or pus. There is no ascending cellulitis. No malodor. No open lesions or pre-ulcerative lesions.  The   Vascular:  Dorsalis Pedis artery and Posterior Tibial artery pedal pulses are 2/4 bilateral.  Capillary fill time < 3 sec to all digits.   Neruologic: Grossly intact via light touch bilateral. Protective threshold intact to all sites bilateral.   Musculoskeletal: No gross boney pedal deformities bilateral. No pain, crepitus, or limitation noted with foot and ankle range of motion bilateral. Muscular strength 5/5 in all groups tested bilateral.  Gait:  Unassisted, Nonantalgic.   No images are attached to the encounter.  Assessment:   1. Ingrown nail of great toe of left foot   2. Paronychia of great toe, left   3. Pain around toenail      Plan:  Patient was evaluated and treated and all questions answered.    Ingrown Nail, left -Patient elects to proceed with minor surgery to remove ingrown toenail today. Consent reviewed and signed by patient. -Ingrown nail excised. See procedure note. -Educated on post-procedure care including soaking. Written instructions provided and reviewed. -Patient to follow up in 2 weeks for nail check.  Procedure: Excision of Ingrown Toenail Location: Left 1st toe medial nail borders. Anesthesia: Lidocaine 1% plain; 1.5 mL and Marcaine 0.5% plain; 1.5 mL, digital block. Skin Prep: Betadine. Dressing: Silvadene; telfa; dry, sterile, compression dressing. Technique: Following skin prep, the toe was exsanguinated and a tourniquet was secured at the base of the toe. The affected nail border was freed, split with a nail splitter, and excised. Chemical matrixectomy was then performed with phenol and irrigated out with alcohol. The tourniquet was then removed and sterile dressing applied. Disposition: Patient tolerated procedure well. Patient to return in 2 weeks for follow-up.    Return in about 2 years (around 10/24/2024) for left hallux nail check .          Everitt Amber, DPM [facility]

## 2022-11-08 ENCOUNTER — Ambulatory Visit: Payer: PPO | Admitting: Podiatry

## 2022-11-21 DIAGNOSIS — I420 Dilated cardiomyopathy: Secondary | ICD-10-CM | POA: Diagnosis not present

## 2022-11-21 DIAGNOSIS — D649 Anemia, unspecified: Secondary | ICD-10-CM | POA: Diagnosis not present

## 2022-11-21 DIAGNOSIS — E785 Hyperlipidemia, unspecified: Secondary | ICD-10-CM | POA: Diagnosis not present

## 2022-11-21 DIAGNOSIS — D6869 Other thrombophilia: Secondary | ICD-10-CM | POA: Diagnosis not present

## 2022-11-21 DIAGNOSIS — I480 Paroxysmal atrial fibrillation: Secondary | ICD-10-CM | POA: Diagnosis not present

## 2022-11-21 DIAGNOSIS — I7090 Unspecified atherosclerosis: Secondary | ICD-10-CM | POA: Diagnosis not present

## 2022-11-21 DIAGNOSIS — E440 Moderate protein-calorie malnutrition: Secondary | ICD-10-CM | POA: Diagnosis not present

## 2022-11-21 DIAGNOSIS — M25531 Pain in right wrist: Secondary | ICD-10-CM | POA: Diagnosis not present

## 2022-11-21 DIAGNOSIS — J449 Chronic obstructive pulmonary disease, unspecified: Secondary | ICD-10-CM | POA: Diagnosis not present

## 2022-11-21 DIAGNOSIS — I5042 Chronic combined systolic (congestive) and diastolic (congestive) heart failure: Secondary | ICD-10-CM | POA: Diagnosis not present

## 2022-11-21 DIAGNOSIS — I110 Hypertensive heart disease with heart failure: Secondary | ICD-10-CM | POA: Diagnosis not present

## 2022-11-21 DIAGNOSIS — E039 Hypothyroidism, unspecified: Secondary | ICD-10-CM | POA: Diagnosis not present

## 2022-12-02 ENCOUNTER — Other Ambulatory Visit: Payer: Self-pay | Admitting: Cardiology

## 2022-12-13 ENCOUNTER — Telehealth: Payer: Self-pay

## 2022-12-13 NOTE — Telephone Encounter (Signed)
Prefer full tablet

## 2022-12-13 NOTE — Telephone Encounter (Signed)
Patient says you told her to cut her losartan in half. I don't see that in your note. Please advise.

## 2022-12-15 ENCOUNTER — Other Ambulatory Visit: Payer: Self-pay

## 2022-12-15 MED ORDER — LOSARTAN POTASSIUM 25 MG PO TABS: 25.0000 mg | ORAL_TABLET | Freq: Every day | ORAL | 3 refills | Status: DC

## 2023-01-03 DIAGNOSIS — L821 Other seborrheic keratosis: Secondary | ICD-10-CM | POA: Diagnosis not present

## 2023-01-03 DIAGNOSIS — D0471 Carcinoma in situ of skin of right lower limb, including hip: Secondary | ICD-10-CM | POA: Diagnosis not present

## 2023-01-03 DIAGNOSIS — L853 Xerosis cutis: Secondary | ICD-10-CM | POA: Diagnosis not present

## 2023-01-03 DIAGNOSIS — Z85828 Personal history of other malignant neoplasm of skin: Secondary | ICD-10-CM | POA: Diagnosis not present

## 2023-01-03 DIAGNOSIS — L720 Epidermal cyst: Secondary | ICD-10-CM | POA: Diagnosis not present

## 2023-01-03 DIAGNOSIS — D485 Neoplasm of uncertain behavior of skin: Secondary | ICD-10-CM | POA: Diagnosis not present

## 2023-01-25 ENCOUNTER — Ambulatory Visit: Payer: PPO | Admitting: Podiatry

## 2023-02-06 DIAGNOSIS — Z515 Encounter for palliative care: Secondary | ICD-10-CM | POA: Diagnosis not present

## 2023-02-06 DIAGNOSIS — E039 Hypothyroidism, unspecified: Secondary | ICD-10-CM | POA: Diagnosis not present

## 2023-02-07 DIAGNOSIS — L570 Actinic keratosis: Secondary | ICD-10-CM | POA: Diagnosis not present

## 2023-02-07 DIAGNOSIS — Z85828 Personal history of other malignant neoplasm of skin: Secondary | ICD-10-CM | POA: Diagnosis not present

## 2023-02-07 DIAGNOSIS — D0471 Carcinoma in situ of skin of right lower limb, including hip: Secondary | ICD-10-CM | POA: Diagnosis not present

## 2023-02-15 ENCOUNTER — Other Ambulatory Visit: Payer: Self-pay

## 2023-02-15 DIAGNOSIS — I480 Paroxysmal atrial fibrillation: Secondary | ICD-10-CM

## 2023-02-15 MED ORDER — FUROSEMIDE 40 MG PO TABS: 40.0000 mg | ORAL_TABLET | Freq: Every day | ORAL | Status: AC

## 2023-02-15 MED ORDER — AMIODARONE HCL 200 MG PO TABS: 100.0000 mg | ORAL_TABLET | Freq: Every day | ORAL | 1 refills | Status: DC

## 2023-02-15 MED ORDER — LOSARTAN POTASSIUM 25 MG PO TABS: 25.0000 mg | ORAL_TABLET | Freq: Every day | ORAL | 3 refills | Status: DC

## 2023-02-15 MED ORDER — METOPROLOL SUCCINATE ER 25 MG PO TB24: 25.0000 mg | ORAL_TABLET | Freq: Every day | ORAL | 1 refills | Status: DC

## 2023-02-15 MED ORDER — APIXABAN 2.5 MG PO TABS: 2.5000 mg | ORAL_TABLET | Freq: Two times a day (BID) | ORAL | 1 refills | Status: AC

## 2023-02-20 ENCOUNTER — Telehealth: Payer: Self-pay | Admitting: Cardiology

## 2023-02-20 NOTE — Telephone Encounter (Signed)
Cancel O2 Rx. Patient does her own things!

## 2023-02-23 DIAGNOSIS — R300 Dysuria: Secondary | ICD-10-CM | POA: Diagnosis not present

## 2023-02-23 DIAGNOSIS — N390 Urinary tract infection, site not specified: Secondary | ICD-10-CM | POA: Diagnosis not present

## 2023-03-01 ENCOUNTER — Ambulatory Visit: Payer: PPO | Admitting: Cardiology

## 2023-03-07 ENCOUNTER — Ambulatory Visit: Payer: PPO | Admitting: Cardiology

## 2023-03-08 ENCOUNTER — Other Ambulatory Visit: Payer: Self-pay | Admitting: Cardiology

## 2023-03-08 DIAGNOSIS — I480 Paroxysmal atrial fibrillation: Secondary | ICD-10-CM

## 2023-03-08 NOTE — Telephone Encounter (Signed)
Please refill.

## 2023-03-09 ENCOUNTER — Ambulatory Visit: Payer: PPO | Admitting: Cardiology

## 2023-03-09 DIAGNOSIS — N390 Urinary tract infection, site not specified: Secondary | ICD-10-CM | POA: Diagnosis not present

## 2023-03-17 DIAGNOSIS — Z87440 Personal history of urinary (tract) infections: Secondary | ICD-10-CM | POA: Diagnosis not present

## 2023-03-23 ENCOUNTER — Ambulatory Visit: Payer: PPO | Admitting: Cardiology

## 2023-03-28 DIAGNOSIS — I480 Paroxysmal atrial fibrillation: Secondary | ICD-10-CM | POA: Diagnosis not present

## 2023-03-28 DIAGNOSIS — D6869 Other thrombophilia: Secondary | ICD-10-CM | POA: Diagnosis not present

## 2023-03-28 DIAGNOSIS — Z7901 Long term (current) use of anticoagulants: Secondary | ICD-10-CM | POA: Diagnosis not present

## 2023-03-28 DIAGNOSIS — J449 Chronic obstructive pulmonary disease, unspecified: Secondary | ICD-10-CM | POA: Diagnosis not present

## 2023-03-28 DIAGNOSIS — I110 Hypertensive heart disease with heart failure: Secondary | ICD-10-CM | POA: Diagnosis not present

## 2023-03-28 DIAGNOSIS — M0600 Rheumatoid arthritis without rheumatoid factor, unspecified site: Secondary | ICD-10-CM | POA: Diagnosis not present

## 2023-03-28 DIAGNOSIS — I5042 Chronic combined systolic (congestive) and diastolic (congestive) heart failure: Secondary | ICD-10-CM | POA: Diagnosis not present

## 2023-03-28 DIAGNOSIS — Z9981 Dependence on supplemental oxygen: Secondary | ICD-10-CM | POA: Diagnosis not present

## 2023-03-28 DIAGNOSIS — I83019 Varicose veins of right lower extremity with ulcer of unspecified site: Secondary | ICD-10-CM | POA: Diagnosis not present

## 2023-03-28 DIAGNOSIS — Z23 Encounter for immunization: Secondary | ICD-10-CM | POA: Diagnosis not present

## 2023-03-28 DIAGNOSIS — I700 Atherosclerosis of aorta: Secondary | ICD-10-CM | POA: Diagnosis not present

## 2023-03-28 DIAGNOSIS — I872 Venous insufficiency (chronic) (peripheral): Secondary | ICD-10-CM | POA: Diagnosis not present

## 2023-03-28 DIAGNOSIS — E441 Mild protein-calorie malnutrition: Secondary | ICD-10-CM | POA: Diagnosis not present

## 2023-04-15 DIAGNOSIS — M85862 Other specified disorders of bone density and structure, left lower leg: Secondary | ICD-10-CM | POA: Diagnosis not present

## 2023-04-15 DIAGNOSIS — M19012 Primary osteoarthritis, left shoulder: Secondary | ICD-10-CM | POA: Diagnosis not present

## 2023-04-15 DIAGNOSIS — Z7901 Long term (current) use of anticoagulants: Secondary | ICD-10-CM | POA: Diagnosis not present

## 2023-04-15 DIAGNOSIS — Z96652 Presence of left artificial knee joint: Secondary | ICD-10-CM | POA: Diagnosis not present

## 2023-04-15 DIAGNOSIS — I6782 Cerebral ischemia: Secondary | ICD-10-CM | POA: Diagnosis not present

## 2023-04-15 DIAGNOSIS — M85822 Other specified disorders of bone density and structure, left upper arm: Secondary | ICD-10-CM | POA: Diagnosis not present

## 2023-04-15 DIAGNOSIS — S42292A Other displaced fracture of upper end of left humerus, initial encounter for closed fracture: Secondary | ICD-10-CM | POA: Diagnosis not present

## 2023-04-15 DIAGNOSIS — Z79899 Other long term (current) drug therapy: Secondary | ICD-10-CM | POA: Diagnosis not present

## 2023-04-15 DIAGNOSIS — S80919A Unspecified superficial injury of unspecified knee, initial encounter: Secondary | ICD-10-CM | POA: Diagnosis not present

## 2023-04-15 DIAGNOSIS — S0990XA Unspecified injury of head, initial encounter: Secondary | ICD-10-CM | POA: Diagnosis not present

## 2023-04-15 DIAGNOSIS — R609 Edema, unspecified: Secondary | ICD-10-CM | POA: Diagnosis not present

## 2023-04-15 DIAGNOSIS — M25562 Pain in left knee: Secondary | ICD-10-CM | POA: Diagnosis not present

## 2023-04-15 DIAGNOSIS — Z043 Encounter for examination and observation following other accident: Secondary | ICD-10-CM | POA: Diagnosis not present

## 2023-04-15 DIAGNOSIS — M129 Arthropathy, unspecified: Secondary | ICD-10-CM | POA: Diagnosis not present

## 2023-04-15 DIAGNOSIS — S80212A Abrasion, left knee, initial encounter: Secondary | ICD-10-CM | POA: Diagnosis not present

## 2023-04-15 DIAGNOSIS — S42212A Unspecified displaced fracture of surgical neck of left humerus, initial encounter for closed fracture: Secondary | ICD-10-CM | POA: Diagnosis not present

## 2023-04-15 DIAGNOSIS — D320 Benign neoplasm of cerebral meninges: Secondary | ICD-10-CM | POA: Diagnosis not present

## 2023-04-15 DIAGNOSIS — S199XXA Unspecified injury of neck, initial encounter: Secondary | ICD-10-CM | POA: Diagnosis not present

## 2023-04-15 DIAGNOSIS — M19022 Primary osteoarthritis, left elbow: Secondary | ICD-10-CM | POA: Diagnosis not present

## 2023-04-15 DIAGNOSIS — M25519 Pain in unspecified shoulder: Secondary | ICD-10-CM | POA: Diagnosis not present

## 2023-04-15 DIAGNOSIS — M25512 Pain in left shoulder: Secondary | ICD-10-CM | POA: Diagnosis not present

## 2023-04-15 DIAGNOSIS — Z743 Need for continuous supervision: Secondary | ICD-10-CM | POA: Diagnosis not present

## 2023-04-18 DIAGNOSIS — S42202A Unspecified fracture of upper end of left humerus, initial encounter for closed fracture: Secondary | ICD-10-CM | POA: Diagnosis not present

## 2023-04-25 DIAGNOSIS — S42202A Unspecified fracture of upper end of left humerus, initial encounter for closed fracture: Secondary | ICD-10-CM | POA: Diagnosis not present

## 2023-04-26 NOTE — Progress Notes (Signed)
Consider patient for Power HF heart failure clinical trial.

## 2023-04-27 ENCOUNTER — Ambulatory Visit: Payer: PPO | Attending: Cardiology | Admitting: Cardiology

## 2023-04-27 ENCOUNTER — Encounter: Payer: Self-pay | Admitting: Cardiology

## 2023-04-27 VITALS — BP 120/60 | HR 69 | Resp 18 | Ht 64.0 in | Wt 114.6 lb

## 2023-04-27 DIAGNOSIS — I480 Paroxysmal atrial fibrillation: Secondary | ICD-10-CM

## 2023-04-27 DIAGNOSIS — I5022 Chronic systolic (congestive) heart failure: Secondary | ICD-10-CM

## 2023-04-27 DIAGNOSIS — I951 Orthostatic hypotension: Secondary | ICD-10-CM | POA: Diagnosis not present

## 2023-04-27 NOTE — Patient Instructions (Addendum)
Medication Instructions:  Your physician has recommended you make the following change in your medication:   1) STOP amlodipine 2) STOP losartan 3) STOP metoprolol succinate  *If you need a refill on your cardiac medications before your next appointment, please call your pharmacy*  Lab Work: None ordered today.  Testing/Procedures: None ordered today.  Follow-Up: At [facility], you and your health needs are our priority.  As part of our continuing mission to provide you with exceptional heart care, we have created designated Provider Care Teams.  These Care Teams include your primary Cardiologist (physician) and Advanced Practice Providers (APPs -  Physician Assistants and Nurse Practitioners) who all work together to provide you with the care you need, when you need it.  Your next appointment:   6 month(s)  The format for your next appointment:   In Person  Provider:   Yates Decamp, MD

## 2023-04-27 NOTE — Progress Notes (Signed)
Cardiology Office Note:  .   Date:  04/27/2023  ID:  Heidi Adams, DOB 1935-11-22, MRN [id number] PCP: Creola Corn, MD  [facility] Providers Cardiologist:  Yates Decamp, MD {  History of Present Illness: .   Heidi Adams is a 87 y.o. female  with bilateral hip replacement and knee replacement and left breast lumpectomy in 2018 without chemotherapy or radiation therapy for breast cancer and also remote hysterectomy due to uterine cancer in 1991, supine hypertension and orthostatic hypotension, paroxysmal atrial fibrillation on chronic Eliquis, chronic dizziness. Has not been able to tolerate any BP medications due to severe dizzy spells.  Remote GI bleed in 2021 with no recurrence.  Discussed the use of AI scribe software for clinical note transcription with the patient, who gave verbal consent to proceed.  History of Present Illness   The patient, with a history of atrial fibrillation and heart failure, presents with orthostatic hypotension. She recently fell at home, resulting in a left humerus fracture. The patient reports feeling dizzy upon standing, which she attributes to her current blood pressure medications. She is currently taking losartan, amlodipine, and metoprolol succinate for blood pressure management, as well as amiodarone for atrial fibrillation. The patient also reports hyperactive bowel sounds, but denies any associated diarrhea or other gastrointestinal symptoms.      Review of Systems  Cardiovascular:  Negative for chest pain, dyspnea on exertion, leg swelling and syncope.  Neurological:  Positive for dizziness.    Physical Exam Vitals reviewed.  Constitutional:      Comments: Moderately build, frail  Neck:     Vascular: No carotid bruit or JVD.  Cardiovascular:     Rate and Rhythm: Normal rate and regular rhythm.     Pulses: Intact distal pulses.          Dorsalis pedis pulses are 1+ on the right side and 1+ on the left side.       Posterior tibial pulses  are 1+ on the right side and 1+ on the left side.     Heart sounds: S1 normal and S2 normal. Murmur heard.     Blowing midsystolic murmur is present with a grade of 2/6 at the apex.     Blowing decrescendo early diastolic murmur is present with a grade of 3/4 radiating to the apex.     No gallop.  Pulmonary:     Effort: Pulmonary effort is normal. No accessory muscle usage.     Breath sounds: Normal breath sounds.  Abdominal:     General: Bowel sounds are normal.     Palpations: Abdomen is soft.  Musculoskeletal:     Right lower leg: No edema.     Left lower leg: No edema.      Risk Assessment/Calculations:    CHA2DS2-VASc Score = 5   This indicates a 7.2% annual risk of stroke. The patient's score is based upon: CHF History: 1 HTN History: 1 Diabetes History: 0 Stroke History: 0 Vascular Disease History: 0 Age Score: 2 Gender Score: 1             Lab Results  Component Value Date   CHOL 142 07/31/2018   HDL 45 07/31/2018   LDLCALC 83 07/31/2018   TRIG 72 07/31/2018   CHOLHDL 3.2 07/31/2018   Lab Results  Component Value Date   NA 138 04/01/2022   K 3.0 (L) 04/01/2022   CO2 29 04/01/2022   GLUCOSE 92 04/01/2022   BUN 24 (H) 04/01/2022  CREATININE 1.11 (H) 04/01/2022   CALCIUM 7.9 (L) 04/01/2022   GFRNONAA 49 (L) 04/01/2022   Lab Results  Component Value Date   WBC 5.4 03/30/2022   HGB 11.3 (L) 03/30/2022   HCT 37.4 03/30/2022   MCV 92.8 03/30/2022   PLT 147 (L) 03/30/2022   External Labs:   Labs 04/26/2022:  Serum glucose 88 mg, BUN 31, creatinine 0.9, EGFR 59 mL, potassium 4.2, LFTs normal.  Hb 12.4/HCT 1, platelets 261.  Labs 10/27/2021:  TSH elevated at 8.78 (0.40-4.20).  Free T41.1, normal.  NT-proBNP 84,696.  Hb 9.5/HCT 30.3, platelets 216.  Serum glucose 81 mg, BUN 21, creatinine 0.7, EGFR 80 mL, potassium 3.6.  Labs 06/14/2022:  Total cholesterol 131, triglycerides 98, HDL 48, LDL 63.  Physical Exam:   VS:  BP 120/60 (BP  Location: Left Arm, Patient Position: Sitting, Cuff Size: Normal)   Pulse 69   Resp 18   Ht 5\' 4"  (1.626 m)   Wt 114 lb 9.6 oz (52 kg)   LMP 07/25/1988 Comment: full   SpO2 97%   BMI 19.67 kg/m    Wt Readings from Last 3 Encounters:  04/27/23 114 lb 9.6 oz (52 kg)  08/31/22 113 lb (51.3 kg)  06/06/22 106 lb 3.2 oz (48.2 kg)     Vitals reviewed.  Constitutional:      Comments: Moderately build, frail  Neck:     Vascular: No carotid bruit or JVD.  Pulmonary:     Effort: Pulmonary effort is normal. No accessory muscle usage.     Breath sounds: Normal breath sounds.  Cardiovascular:     Normal rate. Regular rhythm. S1 normal and S2 normal.     Murmurs: There is a grade 2/6 blowing midsystolic murmur at the apex. There is a grade 3/4 blowing decrescendo, early diastolic murmur, radiating to the apex.     No gallop.   Pulses:    Intact distal pulses.     Dorsalis pedis: 1+ bilaterally.    Posterior tibial: 1+ bilaterally. Edema:    Pretibial: no edema of the left pretibial area and no edema of the right pretibial area. Abdominal:     General: Bowel sounds are normal.     Palpations: Abdomen is soft.      Studies Reviewed: Marland Kitchen   EKG Interpretation Date/Time:  Thursday April 27 2023 11:35:51 EDT Ventricular Rate:  67 PR Interval:  220 QRS Duration:  104 QT Interval:  438 QTC Calculation: 462 R Axis:   262  Text Interpretation: EKG 04/27/2023: Sinus rhythm with first-degree AV block with sinus arrhythmia at the rate of 67 bpm, left anterior fascicular block.  Incomplete right bundle branch block.  Poor R progression, cannot exclude anteroseptal infarct 4.  No evidence of ischemia.  Compared to 09/20/2022, no significant change. Confirmed by Delrae Rend [id number]) on 04/27/2023 12:02:14 PM   EKG 08/31/2022: Normal sinus rhythm at a rate of 68 bpm, left axis deviation, left anterior fascicular block.  Poor R progression, cannot exclude anteroseptal infarct old.  Borderline  criteria for LVH.  Nonspecific T abnormality.  Compared to 01/26/2022, no significant change.  Echocardiogram 08/04/2022: Left ventricle cavity is mildly dilated. Normal left ventricular wall thickness. Severe global hypokinesis. LVEF 35-30%. Doppler evidence of grade II (pseudonormal) diastolic dysfunction, elevated LAP. Left atrial cavity is mildly dilated. Structurally normal trileaflet aortic valve.  Moderate (Grade II) aortic regurgitation. Mild tricuspid regurgitation. No evidence of pulmonary hypertension. Previous study on 12/30/2021 reported LVEF <20%, grade III DD,  moderate MR, trace pericardial effusion. estimated PASP 31 mmHg.  ASSESSMENT AND PLAN: .      ICD-10-CM   1. Orthostatic hypotension  I95.1 EKG 12-Lead    2. Paroxysmal atrial fibrillation (HCC)  I48.0     3. Chronic systolic heart failure (HCC)  Z61.09      CHA2DS2-VASc Score = 5 [CHF History: 1, HTN History: 1, Diabetes History: 0, Stroke History: 0, Vascular Disease History: 0, Age Score: 2, Gender Score: 1].  Therefore, the patient's annual risk of stroke is 7.2 %.     Assessment and Plan    Left Humerus Fracture Occurred on 04/15/2023 due to a fall at home. Patient reports the bone has slipped down and needs to work its way back to its original position. -No specific plan discussed in the conversation.  Orthostatic Hypotension Patient reports feeling dizzy. Blood pressure drops significantly upon standing. -Discontinue Losartan, Amlodipine, and Metoprolol due to orthostatic hypotension.  Atrial Fibrillation Maintaining regular rhythm on Amiodarone. -Continue Amiodarone 100mg  once daily. -Continue Eliquis, renal function has remained stable, CBC has remained stable, no bleeding diathesis.  Heart Failure with reduced LVEF Despite having low EF, patient is doing well with no signs of heart failure on examination. -Continue current management. -I do not think she needs repeat echocardiogram although her last  echocardiogram was almost a year ago.  Unless she has clinical decompensation, then we will consider repeating echocardiogram. -Although she has heart failure with reduced EF, I am discontinuing losartan, metoprolol succinate which were at low dose, due to severe orthostatic hypotension.  She was severely orthostatic and complaints of marked dizziness and has been debilitating for her.  Hence she is not on guideline directed medical therapy however she is 87 years of age, at this point best option is conservative management and symptom related management.  Follow-up in 6 months.   Signed,  Yates Decamp, MD, [facility] 04/27/2023, 12:37 PM

## 2023-05-12 ENCOUNTER — Encounter: Payer: Self-pay | Admitting: Hematology and Oncology

## 2023-05-12 NOTE — Telephone Encounter (Signed)
TC

## 2023-05-15 ENCOUNTER — Emergency Department (HOSPITAL_COMMUNITY)
Admission: EM | Admit: 2023-05-15 | Discharge: 2023-05-16 | Disposition: A | Discharge status: Home / Self Care | Payer: PPO | Attending: Emergency Medicine | Admitting: Emergency Medicine

## 2023-05-15 ENCOUNTER — Emergency Department (HOSPITAL_COMMUNITY): Payer: PPO

## 2023-05-15 ENCOUNTER — Other Ambulatory Visit: Payer: Self-pay

## 2023-05-15 ENCOUNTER — Encounter (HOSPITAL_COMMUNITY): Payer: Self-pay

## 2023-05-15 DIAGNOSIS — I110 Hypertensive heart disease with heart failure: Secondary | ICD-10-CM | POA: Diagnosis not present

## 2023-05-15 DIAGNOSIS — R300 Dysuria: Secondary | ICD-10-CM | POA: Diagnosis not present

## 2023-05-15 DIAGNOSIS — S42292A Other displaced fracture of upper end of left humerus, initial encounter for closed fracture: Secondary | ICD-10-CM | POA: Diagnosis not present

## 2023-05-15 DIAGNOSIS — G9389 Other specified disorders of brain: Secondary | ICD-10-CM | POA: Diagnosis not present

## 2023-05-15 DIAGNOSIS — R29818 Other symptoms and signs involving the nervous system: Secondary | ICD-10-CM | POA: Diagnosis not present

## 2023-05-15 DIAGNOSIS — E039 Hypothyroidism, unspecified: Secondary | ICD-10-CM | POA: Insufficient documentation

## 2023-05-15 DIAGNOSIS — R531 Weakness: Secondary | ICD-10-CM | POA: Diagnosis not present

## 2023-05-15 DIAGNOSIS — I4891 Unspecified atrial fibrillation: Secondary | ICD-10-CM | POA: Diagnosis not present

## 2023-05-15 DIAGNOSIS — R4781 Slurred speech: Secondary | ICD-10-CM | POA: Diagnosis not present

## 2023-05-15 DIAGNOSIS — E860 Dehydration: Secondary | ICD-10-CM | POA: Diagnosis not present

## 2023-05-15 DIAGNOSIS — I517 Cardiomegaly: Secondary | ICD-10-CM | POA: Diagnosis not present

## 2023-05-15 DIAGNOSIS — Z7901 Long term (current) use of anticoagulants: Secondary | ICD-10-CM | POA: Insufficient documentation

## 2023-05-15 DIAGNOSIS — I6782 Cerebral ischemia: Secondary | ICD-10-CM | POA: Diagnosis not present

## 2023-05-15 DIAGNOSIS — R9082 White matter disease, unspecified: Secondary | ICD-10-CM | POA: Diagnosis not present

## 2023-05-15 DIAGNOSIS — R9089 Other abnormal findings on diagnostic imaging of central nervous system: Secondary | ICD-10-CM | POA: Diagnosis not present

## 2023-05-15 DIAGNOSIS — R609 Edema, unspecified: Secondary | ICD-10-CM | POA: Diagnosis not present

## 2023-05-15 DIAGNOSIS — Z79899 Other long term (current) drug therapy: Secondary | ICD-10-CM | POA: Diagnosis not present

## 2023-05-15 DIAGNOSIS — R4182 Altered mental status, unspecified: Secondary | ICD-10-CM | POA: Diagnosis not present

## 2023-05-15 DIAGNOSIS — I509 Heart failure, unspecified: Secondary | ICD-10-CM | POA: Insufficient documentation

## 2023-05-15 LAB — CBC WITH DIFFERENTIAL/PLATELET
Abs Immature Granulocytes: 0.02 10*3/uL (ref 0.00–0.07)
Basophils Absolute: 0 10*3/uL (ref 0.0–0.1)
Basophils Relative: 0 %
Eosinophils Absolute: 0.2 10*3/uL (ref 0.0–0.5)
Eosinophils Relative: 2 %
HCT: 34.8 % — ABNORMAL LOW (ref 36.0–46.0)
Hemoglobin: 10.8 g/dL — ABNORMAL LOW (ref 12.0–15.0)
Immature Granulocytes: 0 %
Lymphocytes Relative: 16 %
Lymphs Abs: 1.1 10*3/uL (ref 0.7–4.0)
MCH: 30.1 pg (ref 26.0–34.0)
MCHC: 31 g/dL (ref 30.0–36.0)
MCV: 96.9 fL (ref 80.0–100.0)
Monocytes Absolute: 0.4 10*3/uL (ref 0.1–1.0)
Monocytes Relative: 6 %
Neutro Abs: 5.1 10*3/uL (ref 1.7–7.7)
Neutrophils Relative %: 76 %
Platelets: 257 10*3/uL (ref 150–400)
RBC: 3.59 MIL/uL — ABNORMAL LOW (ref 3.87–5.11)
RDW: 14.4 % (ref 11.5–15.5)
WBC: 6.8 10*3/uL (ref 4.0–10.5)
nRBC: 0 % (ref 0.0–0.2)

## 2023-05-15 LAB — COMPREHENSIVE METABOLIC PANEL
ALT: 9 U/L (ref 0–44)
AST: 16 U/L (ref 15–41)
Albumin: 3.1 g/dL — ABNORMAL LOW (ref 3.5–5.0)
Alkaline Phosphatase: 80 U/L (ref 38–126)
Anion gap: 12 (ref 5–15)
BUN: 25 mg/dL — ABNORMAL HIGH (ref 8–23)
CO2: 26 mmol/L (ref 22–32)
Calcium: 9.5 mg/dL (ref 8.9–10.3)
Chloride: 106 mmol/L (ref 98–111)
Creatinine, Ser: 0.95 mg/dL (ref 0.44–1.00)
GFR, Estimated: 59 mL/min — ABNORMAL LOW (ref >60)
Glucose, Bld: 122 mg/dL — ABNORMAL HIGH (ref 70–99)
Potassium: 3.3 mmol/L — ABNORMAL LOW (ref 3.5–5.1)
Sodium: 144 mmol/L (ref 135–145)
Total Bilirubin: 0.5 mg/dL (ref 0.3–1.2)
Total Protein: 6 g/dL — ABNORMAL LOW (ref 6.5–8.1)

## 2023-05-15 LAB — URINALYSIS, ROUTINE W REFLEX MICROSCOPIC
Bacteria, UA: NONE SEEN
Bilirubin Urine: NEGATIVE
Glucose, UA: NEGATIVE mg/dL
Hgb urine dipstick: NEGATIVE
Ketones, ur: NEGATIVE mg/dL
Nitrite: NEGATIVE
Protein, ur: NEGATIVE mg/dL
Specific Gravity, Urine: 1.011 (ref 1.005–1.030)
pH: 7 (ref 5.0–8.0)

## 2023-05-15 LAB — POC OCCULT BLOOD, ED: Fecal Occult Bld: NEGATIVE

## 2023-05-15 MED ORDER — POTASSIUM CHLORIDE CRYS ER 20 MEQ PO TBCR
40.0000 meq | EXTENDED_RELEASE_TABLET | Freq: Once | ORAL | Status: AC
  Administered 2023-05-15: 40 meq via ORAL
  Filled 2023-05-15: qty 2

## 2023-05-15 MED ORDER — SODIUM CHLORIDE 0.9 % IV BOLUS
500.0000 mL | Freq: Once | INTRAVENOUS | Status: AC
Start: 2023-05-15 — End: 2023-05-15
  Administered 2023-05-15: 500 mL via INTRAVENOUS

## 2023-05-15 MED ORDER — ACETAMINOPHEN 500 MG PO TABS
1000.0000 mg | ORAL_TABLET | Freq: Once | ORAL | Status: AC
  Administered 2023-05-15: 1000 mg via ORAL
  Filled 2023-05-15: qty 2

## 2023-05-15 NOTE — ED Provider Notes (Signed)
Patient signed out to me at shift change pending MRI.  Had reported slurred speech for that past couple of days that her PCP noticed.  Sent ED for evaluation.  If MRI negative plan for discharge home.  Consider PT/OT home health.  Discussed with Dr. Particia Nearing, who has also seen the patient and agrees with plan.  Patient reassessed.  She is alert and oriented.  Speaks clearly to me.  MRI and CT negative for stroke.  I don't think she any further workup tonight and can be discharged home.   Roxy Horseman, PA-C 05/16/23 [id number]    Ernie Avena, MD 05/16/23 [phone]

## 2023-05-15 NOTE — ED Triage Notes (Signed)
Pt BIBA from home, c/o weakness since 2 days ago.  Pt came in because she was worried about having a stroke.  A/Ox4.  Pt takes eliquis.  Urine frequency, dark colored urine, and dysuria. Left shoulder is broken.    BP 160/90 HR 76 O2 96 CBG 150

## 2023-05-15 NOTE — ED Notes (Signed)
Pt gone to CT 

## 2023-05-15 NOTE — ED Provider Notes (Signed)
[place] [facility] DEPARTMENT AT [facility] Provider Note   CSN: [id number] Arrival date & time: 05/15/23  1602     History  Chief Complaint  Patient presents with   Weakness   Dysuria    Heidi Adams is a 87 y.o. female with past medical history of CHF (EF 35%), hypertension, hypothyroidism, A-fib (on Eliquis) presents to ED via EMS for evaluation of weakness and dysuria over the past 2 days.  Patient called EMS due to having generalized weakness causing her to have difficulty walking requiring has been to hold her arm to keep her steady and being concerned for stroke.  She denies back pain, fever, abd pain.  She was seen at PCP today who recommended her to go to hospital due to slurred speech and weakness. She also complains of "dark colored underwear", dark colored urine, and dysuria. She denies blood in stool (onm Elliquis).    Weakness Associated symptoms: dysuria   Associated symptoms: no abdominal pain, no chest pain, no cough, no diarrhea, no dizziness, no fever, no headaches, no nausea, no seizures, no shortness of breath and no vomiting   Dysuria Associated symptoms: no abdominal pain, no fever, no nausea and no vomiting        Home Medications Prior to Admission medications   Medication Sig Start Date End Date Taking? Authorizing Provider  acetaminophen (TYLENOL) 500 MG tablet Take 1,000 mg by mouth 3 (three) times daily as needed for moderate pain.     [provider]  amiodarone (PACERONE) 200 MG tablet TAKE 1/2 TABLET BY MOUTH EVERY DAY 03/08/23   Yates Decamp, MD  amoxicillin (AMOXIL) 250 MG capsule Take 250 mg by mouth 3 (three) times daily. Patient is talking as needed only Patient not taking: Reported on 04/27/2023 06/28/22   [provider]  apixaban (ELIQUIS) 2.5 MG TABS tablet Take 1 tablet (2.5 mg total) by mouth 2 (two) times daily. 02/15/23   Yates Decamp, MD  furosemide (LASIX) 40 MG tablet Take 1 tablet (40 mg total) by mouth  daily. 02/15/23   Yates Decamp, MD  levothyroxine (SYNTHROID) 125 MCG tablet Take 125 mcg by mouth daily. 10/27/21   [provider]  pantoprazole (PROTONIX) 40 MG tablet Take 40 mg by mouth daily as needed (indigestion/heartburn.).  01/20/16   [provider]  potassium chloride (KLOR-CON M) 20 MEQ tablet Take 1 tablet (20 mEq total) by mouth daily. Take 2 tablets( 40 meq) for next 3 days then continue taking once daily Patient not taking: Reported on 04/27/2023 04/02/22   Burnadette Pop, MD  senna-docusate (SENOKOT-S) 8.6-50 MG tablet Take 1 tablet by mouth at bedtime as needed for mild constipation. Patient not taking: Reported on 04/27/2023 04/01/22   Burnadette Pop, MD      Allergies    Codeine, Epinephrine, Hydrocodone-acetaminophen, and Tenormin [atenolol]    Review of Systems   Review of Systems  Constitutional:  Negative for chills, fatigue and fever.  Respiratory:  Negative for cough, chest tightness, shortness of breath and wheezing.   Cardiovascular:  Negative for chest pain and palpitations.  Gastrointestinal:  Positive for blood in stool. Negative for abdominal pain, constipation, diarrhea, nausea and vomiting.  Genitourinary:  Positive for dysuria.  Neurological:  Positive for speech difficulty and weakness. Negative for dizziness, seizures, light-headedness, numbness and headaches.    Physical Exam Updated Vital Signs BP (!) 183/160   Pulse 77   Temp 98.4 F (36.9 C)   Resp 17   Ht  5\' 4"  (1.626 m)   Wt 50.8 kg   LMP 07/25/1988 Comment: full   SpO2 100%   BMI 19.22 kg/m  Physical Exam Vitals and nursing note reviewed.  Constitutional:      General: She is not in acute distress.    Appearance: Normal appearance.  HENT:     Head: Normocephalic and atraumatic.     Mouth/Throat:     Mouth: Mucous membranes are dry.  Eyes:     General:        Right eye: No discharge.        Left eye: No discharge.     Extraocular Movements: Extraocular movements  intact.     Conjunctiva/sclera: Conjunctivae normal.     Pupils: Pupils are equal, round, and reactive to light.  Cardiovascular:     Rate and Rhythm: Normal rate.  Pulmonary:     Effort: Pulmonary effort is normal. No respiratory distress.  Abdominal:     General: There is no distension.     Palpations: Abdomen is soft.     Tenderness: There is no abdominal tenderness. There is no guarding.  Musculoskeletal:     Cervical back: Normal range of motion and neck supple. No rigidity or tenderness.     Comments: Unable to lift left arm secondary to previous proximal humerus fracture  Skin:    General: Skin is warm.     Capillary Refill: Capillary refill takes less than 2 seconds.     Coloration: Skin is not jaundiced or pale.  Neurological:     Mental Status: She is alert and oriented to person, place, and time. Mental status is at baseline.     Cranial Nerves: No cranial nerve deficit.     Sensory: No sensory deficit.     Motor: Weakness present.     ED Results / Procedures / Treatments   Labs (all labs ordered are listed, but only abnormal results are displayed) Labs Reviewed  CBC WITH DIFFERENTIAL/PLATELET - Abnormal; Notable for the following components:      Result Value   RBC 3.59 (*)    Hemoglobin 10.8 (*)    HCT 34.8 (*)    All other components within normal limits  COMPREHENSIVE METABOLIC PANEL - Abnormal; Notable for the following components:   Potassium 3.3 (*)    Glucose, Bld 122 (*)    BUN 25 (*)    Total Protein 6.0 (*)    Albumin 3.1 (*)    GFR, Estimated 59 (*)    All other components within normal limits  URINALYSIS, ROUTINE W REFLEX MICROSCOPIC - Abnormal; Notable for the following components:   Leukocytes,Ua TRACE (*)    All other components within normal limits  TSH  POC OCCULT BLOOD, ED    EKG None  Radiology CT Head Wo Contrast  Result Date: 05/15/2023 CLINICAL DATA:  Acute neurological deficit. Stroke suspected. Weakness for 2 days. EXAM: CT  HEAD WITHOUT CONTRAST TECHNIQUE: Contiguous axial images were obtained from the base of the skull through the vertex without intravenous contrast. RADIATION DOSE REDUCTION: This exam was performed according to the departmental dose-optimization program which includes automated exposure control, adjustment of the mA and/or kV according to patient size and/or use of iterative reconstruction technique. COMPARISON:  04/17/2022 FINDINGS: Brain: Diffuse cerebral atrophy. Ventricular dilatation consistent with central atrophy. Low-attenuation changes in the deep white matter consistent with small vessel ischemia. There is a 7 mm diameter extra-axial calcified lesion in the right anterior frontal region likely representing a  meningioma. No change since prior study. No abnormal extra-axial fluid collections. No mass effect or midline shift. Gray-white matter junctions are distinct. Basal cisterns are not effaced. No acute intracranial hemorrhage. Vascular: No hyperdense vessel or unexpected calcification. Skull: Normal. Negative for fracture or focal lesion. Sinuses/Orbits: No acute finding. Other: None. IMPRESSION: 1. No acute intracranial abnormalities. 2. Chronic atrophy and small vessel ischemic changes. 3. 7 mm extra-axial lesion along the left anterior frontal region likely representing a calcified meningioma. No change. Electronically Signed   By: Burman Nieves M.D.   On: 05/15/2023 20:28   DG Chest Portable 1 View  Result Date: 05/15/2023 CLINICAL DATA:  Altered mental status, weakness, possible stroke. Left shoulder fracture. EXAM: PORTABLE CHEST 1 VIEW COMPARISON:  03/29/2022, 04/15/2023. FINDINGS: The heart is enlarged and the mediastinal contour is within normal limits. There is atherosclerotic calcification of the aorta. No consolidation, effusion, or pneumothorax. There is an impaction fracture of the left humeral neck with some bone callus formation. The remaining bony structures appear intact  IMPRESSION: 1. No active disease. 2. Cardiomegaly. 3. Impaction fracture of the left humeral neck with some bone callus formation. Electronically Signed   By: Thornell Sartorius M.D.   On: 05/15/2023 20:25    Procedures Procedures    Medications Ordered in ED Medications  sodium chloride 0.9 % bolus 500 mL (0 mLs Intravenous Stopped 05/15/23 1844)    ED Course/ Medical Decision Making/ A&P                                 Medical Decision Making    Patient presents to the ED for concern of weakness, dysuria, slurred speech, this involves an extensive number of treatment options, and is a complaint that carries with it a high risk of complications and morbidity.  The differential diagnosis includes CVA, UTI, electrolyte abnormality, PNA. This is not an exhaustive list.    Additional history obtained:  Additional history obtained from EMS, Family, Nursing, Outside Medical Records, and Past Admission   External records from outside source obtained and reviewed including primary care provider's note that recommended her to go to emergency department for slurred speech   Lab Tests:  I Ordered, and personally interpreted labs (CBC, CMP, UA).  The pertinent results include: Hgb of 10.8 which is similar to prior 1 year ago   Imaging Studies ordered:  I ordered imaging studies including CXR, CT head, MR head I independently visualized and interpreted imaging which showed no acute cardiopulmonary disease and negative ICH MRI head WO contrast pending upon sign out I agree with the radiologist interpretation   Cardiac Monitoring:  The patient was maintained on a cardiac monitor.  I personally viewed and interpreted the cardiac monitored which showed an underlying rhythm of: Sinus rhythm with no STE   Medicines ordered and prescription drug management:  Did not order BP medications to allow for "permissive HTN" if patient has ischemic stroke    Problem List / ED  Course:  Generalized weakness Slurred speech   Reevaluation:  After the interventions noted above, I reevaluated the patient and found that they have :stayed the same   Dispostion:  I spoke to husband who reports that patient has been acting her normal over the past two days.  She had weakness this morning causing her to use him for stability while walking causing her to be concerned about a stroke. However, he did not have significant  information regarding current event and did not mention her visiting her PCP.   She is A&O x 3 and able to follow commands appropriately.  On 04/15/2023 she was seen in ED for fall with normal head CT but was diagnosed with left shoulder fx. She is currently being managed by ortho for this.  During ED care, no slurred speech noted and has been neurologically intact (see PE). As patient complains of weakness and dysuria - will obtain UA out UTI.  Will obtain lab work and EKG as patient has weakness of the past 2 days.  Upon exam, patient has generalized weakness equally.  Blood pressure initially was 161/69 has increased to 213/77.  Upon chart review, cardiology DC'd prescribed losartan, amlodipine, metoprolol due to orthostatic hypotension. While awaiting CT results to r/o ICH, allowed for permissive HTN.  Lab work negative for leukocytosis and electrolyte abnormality requiring ED intervention.  UA negative for infection. Occult stool negative. Hgb within patient's baseline. CT head negative for ICH.  Will ambulate patient to see how weakness is.  Patient ambulate with nursing staff. Was able to stand without shaking and make small shuffles with walker. She reports that prior to two days ago she was able to walk with cane or husband on one side of her.  Will obtain MRI to rule out ischemic stroke as reason for weakness and episode of slurred speech this morning. I explained current treatment plan with patient who expresses understanding. She reports she is not  interested in SNF placement and is able to manage at home. She is interested in home PT to improve her strength.  MRI pending. If negative, patient can be discharged home to follow up with PCP. Sign out to Roxy Horseman PA  Dr. Particia Nearing individually assessed patient and agrees with plan.        Final Clinical Impression(s) / ED Diagnoses Final diagnoses:  Slurred speech  Generalized weakness    Rx / DC Orders ED Discharge Orders     None         Judithann Sheen, PA 05/15/23 2240    Jacalyn Lefevre, MD 05/16/23 1625

## 2023-05-16 ENCOUNTER — Telehealth: Payer: Self-pay | Admitting: Cardiology

## 2023-05-16 LAB — TSH: TSH: 9.04 u[IU]/mL — ABNORMAL HIGH (ref 0.350–4.500)

## 2023-05-16 NOTE — Telephone Encounter (Signed)
Spoke with patient and she states yesterday she was experiemcing slurred speech and weakness. She went to see her PCP who sent her to ED.  Her BP was 186/160. She states while in ED they did advise she should start back on her BP medication. She did seem confused about what medication she is supposed to be taking.  Discussed what medications were stopped at last visit and what she should still be taking. Did advised to continue monitoring BP at home. ED precautions discussed. She verbalized understanding

## 2023-05-16 NOTE — ED Notes (Signed)
Attempted to call Pt's son but received no answer

## 2023-05-16 NOTE — Telephone Encounter (Signed)
Patient states she went to the ED yesterday as advised by her PCP. She would like to know if she can discuss her medication changes. Patient mentions today her BP was 167/80 and 288 systolic when she was in the hospital yesterday.

## 2023-05-16 NOTE — Discharge Instructions (Signed)
Your CT scan and MRI showed no evidence of stroke.  Please follow-up with your doctor.  Return for any new or worsening symptoms.

## 2023-05-16 NOTE — ED Notes (Signed)
Pt reported that she had been told that she could "stay in the ED all night" from someone earlier in the day, and was unaware that she may be potentially discharged. Attempted to call Pt's husband to get the Pt a ride home without success.

## 2023-05-16 NOTE — ED Notes (Signed)
Reached Pt's granddaughter, who stated that either her or her uncle would come to get the Pt

## 2023-05-16 NOTE — ED Notes (Signed)
Holding discharge for family arrival

## 2023-05-18 ENCOUNTER — Telehealth: Payer: Self-pay | Admitting: Cardiology

## 2023-05-18 NOTE — Telephone Encounter (Signed)
Pt states she needs a list of her current medications. Please advise

## 2023-05-18 NOTE — Telephone Encounter (Signed)
Spoke with the patient and reviewed her medication list with her. She states that she has only been taking Lasix as needed for leg swelling. She has not had any recent issues so she has not been taking it. I have updated her medication list.

## 2023-05-24 ENCOUNTER — Encounter: Payer: PPO | Admitting: Nurse Practitioner

## 2023-05-24 ENCOUNTER — Telehealth: Payer: Self-pay | Admitting: Cardiology

## 2023-05-24 NOTE — Telephone Encounter (Signed)
Granddaughter calling in patient bp is 195/103, calling to see what they would need to do. Should patient stop taking their medication or should they increase the dosage. Please advise

## 2023-05-24 NOTE — Telephone Encounter (Signed)
Spoke with Pt who gave me permission to speak with her granddaughter Verlon Au. Verlon Au states pt is dizzy and weak but denied other symptoms. BP 195/103 and HR 74. Pt took losartan 50 mg earlier today. Family states they will not be taking her to the hospital. Daisey Must I would see what I could do.  Pt was taken off of many medications at last visit on 10/3 for low BP. Spoke with Dr Elease Hashimoto who suggested Starting back the losartan 25mg  daily (starting tomorrow since she took 50mg  today) and metoprolol 25 mg daily, and to see a DOD next week.  Called Verlon Au back and let her know the recommendations and set up a 11/7 appointment with DOD.

## 2023-05-24 NOTE — Telephone Encounter (Signed)
Spoke with Verlon Au. Said pt's blood pressure was lower when she went back over. Daisey Must to start both medications tomorrow. Stated again to think about taking her to ED to be elevated. Verlon Au said they were not going to go to ED. Told her to call PCP as Dr Elease Hashimoto advised. Stated she would call PCP.

## 2023-05-24 NOTE — Telephone Encounter (Signed)
Patient's granddaughter is following up requesting to speak with Judeth Cornfield, RN, again if possible.

## 2023-05-25 DIAGNOSIS — R531 Weakness: Secondary | ICD-10-CM | POA: Diagnosis not present

## 2023-05-25 DIAGNOSIS — E039 Hypothyroidism, unspecified: Secondary | ICD-10-CM | POA: Diagnosis not present

## 2023-05-25 DIAGNOSIS — K219 Gastro-esophageal reflux disease without esophagitis: Secondary | ICD-10-CM | POA: Diagnosis not present

## 2023-05-25 DIAGNOSIS — M5030 Other cervical disc degeneration, unspecified cervical region: Secondary | ICD-10-CM | POA: Diagnosis not present

## 2023-05-25 DIAGNOSIS — S42202D Unspecified fracture of upper end of left humerus, subsequent encounter for fracture with routine healing: Secondary | ICD-10-CM | POA: Diagnosis not present

## 2023-05-25 DIAGNOSIS — I498 Other specified cardiac arrhythmias: Secondary | ICD-10-CM | POA: Diagnosis not present

## 2023-05-25 DIAGNOSIS — R55 Syncope and collapse: Secondary | ICD-10-CM | POA: Diagnosis not present

## 2023-05-25 DIAGNOSIS — I6782 Cerebral ischemia: Secondary | ICD-10-CM | POA: Diagnosis not present

## 2023-05-25 DIAGNOSIS — I517 Cardiomegaly: Secondary | ICD-10-CM | POA: Diagnosis not present

## 2023-05-25 DIAGNOSIS — W19XXXA Unspecified fall, initial encounter: Secondary | ICD-10-CM | POA: Diagnosis not present

## 2023-05-25 DIAGNOSIS — I10 Essential (primary) hypertension: Secondary | ICD-10-CM | POA: Diagnosis not present

## 2023-05-25 DIAGNOSIS — R9089 Other abnormal findings on diagnostic imaging of central nervous system: Secondary | ICD-10-CM | POA: Diagnosis not present

## 2023-05-25 DIAGNOSIS — S42202A Unspecified fracture of upper end of left humerus, initial encounter for closed fracture: Secondary | ICD-10-CM | POA: Diagnosis not present

## 2023-05-25 DIAGNOSIS — I4891 Unspecified atrial fibrillation: Secondary | ICD-10-CM | POA: Diagnosis not present

## 2023-05-25 DIAGNOSIS — S41021A Laceration with foreign body of right shoulder, initial encounter: Secondary | ICD-10-CM | POA: Diagnosis not present

## 2023-05-26 DIAGNOSIS — Z9181 History of falling: Secondary | ICD-10-CM | POA: Diagnosis not present

## 2023-05-26 DIAGNOSIS — I951 Orthostatic hypotension: Secondary | ICD-10-CM | POA: Diagnosis not present

## 2023-05-26 DIAGNOSIS — W19XXXA Unspecified fall, initial encounter: Secondary | ICD-10-CM | POA: Diagnosis not present

## 2023-05-26 DIAGNOSIS — I480 Paroxysmal atrial fibrillation: Secondary | ICD-10-CM | POA: Diagnosis not present

## 2023-05-26 DIAGNOSIS — I5022 Chronic systolic (congestive) heart failure: Secondary | ICD-10-CM | POA: Diagnosis not present

## 2023-05-26 DIAGNOSIS — I110 Hypertensive heart disease with heart failure: Secondary | ICD-10-CM | POA: Diagnosis not present

## 2023-05-26 DIAGNOSIS — Z8679 Personal history of other diseases of the circulatory system: Secondary | ICD-10-CM | POA: Diagnosis not present

## 2023-05-26 DIAGNOSIS — I10 Essential (primary) hypertension: Secondary | ICD-10-CM | POA: Diagnosis not present

## 2023-05-27 DIAGNOSIS — S51811A Laceration without foreign body of right forearm, initial encounter: Secondary | ICD-10-CM | POA: Diagnosis not present

## 2023-05-27 DIAGNOSIS — E890 Postprocedural hypothyroidism: Secondary | ICD-10-CM | POA: Diagnosis not present

## 2023-05-27 DIAGNOSIS — Z9181 History of falling: Secondary | ICD-10-CM | POA: Diagnosis not present

## 2023-05-27 DIAGNOSIS — K219 Gastro-esophageal reflux disease without esophagitis: Secondary | ICD-10-CM | POA: Diagnosis not present

## 2023-05-27 DIAGNOSIS — Z85828 Personal history of other malignant neoplasm of skin: Secondary | ICD-10-CM | POA: Diagnosis not present

## 2023-05-27 DIAGNOSIS — I480 Paroxysmal atrial fibrillation: Secondary | ICD-10-CM | POA: Diagnosis not present

## 2023-05-27 DIAGNOSIS — I110 Hypertensive heart disease with heart failure: Secondary | ICD-10-CM | POA: Diagnosis not present

## 2023-05-27 DIAGNOSIS — Z79890 Hormone replacement therapy (postmenopausal): Secondary | ICD-10-CM | POA: Diagnosis not present

## 2023-05-27 DIAGNOSIS — W19XXXA Unspecified fall, initial encounter: Secondary | ICD-10-CM | POA: Diagnosis not present

## 2023-05-27 DIAGNOSIS — Z7901 Long term (current) use of anticoagulants: Secondary | ICD-10-CM | POA: Diagnosis not present

## 2023-05-27 DIAGNOSIS — I5022 Chronic systolic (congestive) heart failure: Secondary | ICD-10-CM | POA: Diagnosis not present

## 2023-05-27 DIAGNOSIS — I081 Rheumatic disorders of both mitral and tricuspid valves: Secondary | ICD-10-CM | POA: Diagnosis not present

## 2023-05-27 DIAGNOSIS — Z743 Need for continuous supervision: Secondary | ICD-10-CM | POA: Diagnosis not present

## 2023-05-27 DIAGNOSIS — I951 Orthostatic hypotension: Secondary | ICD-10-CM | POA: Diagnosis not present

## 2023-05-27 DIAGNOSIS — Z87891 Personal history of nicotine dependence: Secondary | ICD-10-CM | POA: Diagnosis not present

## 2023-05-27 DIAGNOSIS — I509 Heart failure, unspecified: Secondary | ICD-10-CM | POA: Diagnosis not present

## 2023-05-27 DIAGNOSIS — E039 Hypothyroidism, unspecified: Secondary | ICD-10-CM | POA: Diagnosis not present

## 2023-05-27 DIAGNOSIS — R531 Weakness: Secondary | ICD-10-CM | POA: Diagnosis not present

## 2023-05-27 DIAGNOSIS — R296 Repeated falls: Secondary | ICD-10-CM | POA: Diagnosis not present

## 2023-05-27 DIAGNOSIS — I4891 Unspecified atrial fibrillation: Secondary | ICD-10-CM | POA: Diagnosis not present

## 2023-05-27 DIAGNOSIS — Z79899 Other long term (current) drug therapy: Secondary | ICD-10-CM | POA: Diagnosis not present

## 2023-05-28 DIAGNOSIS — I110 Hypertensive heart disease with heart failure: Secondary | ICD-10-CM | POA: Diagnosis not present

## 2023-05-28 DIAGNOSIS — W19XXXA Unspecified fall, initial encounter: Secondary | ICD-10-CM | POA: Diagnosis not present

## 2023-05-28 DIAGNOSIS — I509 Heart failure, unspecified: Secondary | ICD-10-CM | POA: Diagnosis not present

## 2023-05-28 DIAGNOSIS — K219 Gastro-esophageal reflux disease without esophagitis: Secondary | ICD-10-CM | POA: Diagnosis not present

## 2023-05-28 DIAGNOSIS — I4891 Unspecified atrial fibrillation: Secondary | ICD-10-CM | POA: Diagnosis not present

## 2023-05-29 DIAGNOSIS — W19XXXA Unspecified fall, initial encounter: Secondary | ICD-10-CM | POA: Diagnosis not present

## 2023-05-29 DIAGNOSIS — I5022 Chronic systolic (congestive) heart failure: Secondary | ICD-10-CM | POA: Diagnosis not present

## 2023-05-29 DIAGNOSIS — I110 Hypertensive heart disease with heart failure: Secondary | ICD-10-CM | POA: Diagnosis not present

## 2023-05-29 DIAGNOSIS — I951 Orthostatic hypotension: Secondary | ICD-10-CM | POA: Diagnosis not present

## 2023-05-29 DIAGNOSIS — I4891 Unspecified atrial fibrillation: Secondary | ICD-10-CM | POA: Diagnosis not present

## 2023-05-29 DIAGNOSIS — Z7901 Long term (current) use of anticoagulants: Secondary | ICD-10-CM | POA: Diagnosis not present

## 2023-05-30 DIAGNOSIS — I951 Orthostatic hypotension: Secondary | ICD-10-CM | POA: Diagnosis not present

## 2023-05-30 DIAGNOSIS — W19XXXA Unspecified fall, initial encounter: Secondary | ICD-10-CM | POA: Diagnosis not present

## 2023-05-30 DIAGNOSIS — R296 Repeated falls: Secondary | ICD-10-CM | POA: Diagnosis not present

## 2023-05-30 DIAGNOSIS — K219 Gastro-esophageal reflux disease without esophagitis: Secondary | ICD-10-CM | POA: Diagnosis not present

## 2023-05-30 DIAGNOSIS — I509 Heart failure, unspecified: Secondary | ICD-10-CM | POA: Diagnosis not present

## 2023-05-30 DIAGNOSIS — I4891 Unspecified atrial fibrillation: Secondary | ICD-10-CM | POA: Diagnosis not present

## 2023-05-30 DIAGNOSIS — I110 Hypertensive heart disease with heart failure: Secondary | ICD-10-CM | POA: Diagnosis not present

## 2023-05-30 DIAGNOSIS — E039 Hypothyroidism, unspecified: Secondary | ICD-10-CM | POA: Diagnosis not present

## 2023-06-01 ENCOUNTER — Ambulatory Visit: Payer: PPO | Admitting: Cardiovascular Disease

## 2023-06-01 DIAGNOSIS — D649 Anemia, unspecified: Secondary | ICD-10-CM | POA: Diagnosis not present

## 2023-06-01 DIAGNOSIS — Z743 Need for continuous supervision: Secondary | ICD-10-CM | POA: Diagnosis not present

## 2023-06-01 DIAGNOSIS — W19XXXA Unspecified fall, initial encounter: Secondary | ICD-10-CM | POA: Diagnosis not present

## 2023-06-01 DIAGNOSIS — I10 Essential (primary) hypertension: Secondary | ICD-10-CM | POA: Diagnosis not present

## 2023-06-01 DIAGNOSIS — Z79899 Other long term (current) drug therapy: Secondary | ICD-10-CM | POA: Diagnosis not present

## 2023-06-01 DIAGNOSIS — I480 Paroxysmal atrial fibrillation: Secondary | ICD-10-CM | POA: Diagnosis not present

## 2023-06-01 DIAGNOSIS — I509 Heart failure, unspecified: Secondary | ICD-10-CM | POA: Diagnosis not present

## 2023-06-01 DIAGNOSIS — R350 Frequency of micturition: Secondary | ICD-10-CM | POA: Diagnosis not present

## 2023-06-01 DIAGNOSIS — R531 Weakness: Secondary | ICD-10-CM | POA: Diagnosis not present

## 2023-06-01 DIAGNOSIS — E119 Type 2 diabetes mellitus without complications: Secondary | ICD-10-CM | POA: Diagnosis not present

## 2023-06-01 DIAGNOSIS — I4891 Unspecified atrial fibrillation: Secondary | ICD-10-CM | POA: Diagnosis not present

## 2023-06-01 DIAGNOSIS — I951 Orthostatic hypotension: Secondary | ICD-10-CM | POA: Diagnosis not present

## 2023-06-01 DIAGNOSIS — R296 Repeated falls: Secondary | ICD-10-CM | POA: Diagnosis not present

## 2023-06-01 DIAGNOSIS — I5040 Unspecified combined systolic (congestive) and diastolic (congestive) heart failure: Secondary | ICD-10-CM | POA: Diagnosis not present

## 2023-06-01 DIAGNOSIS — E039 Hypothyroidism, unspecified: Secondary | ICD-10-CM | POA: Diagnosis not present

## 2023-06-01 DIAGNOSIS — R8290 Unspecified abnormal findings in urine: Secondary | ICD-10-CM | POA: Diagnosis not present

## 2023-06-01 DIAGNOSIS — R300 Dysuria: Secondary | ICD-10-CM | POA: Diagnosis not present

## 2023-06-01 DIAGNOSIS — K219 Gastro-esophageal reflux disease without esophagitis: Secondary | ICD-10-CM | POA: Diagnosis not present

## 2023-06-01 DIAGNOSIS — K5900 Constipation, unspecified: Secondary | ICD-10-CM | POA: Diagnosis not present

## 2023-06-01 DIAGNOSIS — R42 Dizziness and giddiness: Secondary | ICD-10-CM | POA: Diagnosis not present

## 2023-06-01 DIAGNOSIS — R1310 Dysphagia, unspecified: Secondary | ICD-10-CM | POA: Diagnosis not present

## 2023-06-01 NOTE — Progress Notes (Deleted)
  Cardiology Office Note:    Date:  06/01/2023   ID:  KEELYN Adams, DOB 08/05/1935, MRN [id number]  PCP:  Creola Corn, MD   [facility] Providers Cardiologist:  Yates Decamp, MD { Click to update primary MD,subspecialty MD or APP then REFRESH:1}    Referring MD: Creola Corn, MD   No chief complaint on file. ***  History of Present Illness:    Heidi Adams is a 87 y.o. female with a hx of ***   Current Medications: No outpatient medications have been marked as taking for the 06/01/23 encounter (Appointment) with Tonny Bollman, MD.     Allergies:   Codeine, Epinephrine, Hydrocodone-acetaminophen, and Tenormin [atenolol]   ROS:   Please see the history of present illness.    *** All other systems reviewed and are negative.  EKGs/Labs/Other Studies Reviewed:    The following studies were reviewed today: Cardiac Studies & Procedures       ECHOCARDIOGRAM  PCV ECHOCARDIOGRAM COMPLETE 08/04/2022  Narrative Echocardiogram 08/04/2022: Left ventricle cavity is mildly dilated. Normal left ventricular wall thickness. Severe global hypokinesis. LVEF 35-30%. Doppler evidence of grade II (pseudonormal) diastolic dysfunction, elevated LAP. Left atrial cavity is mildly dilated. Structurally normal trileaflet aortic valve.  Moderate (Grade II) aortic regurgitation. Mild tricuspid regurgitation. No evidence of pulmonary hypertension. Previous study on 12/30/2021 reported LVEF <20%, grade III DD, trace pericardial effusion. estimated PASP 31 mmHg.             EKG:        Recent Labs: 05/15/2023: ALT 9; BUN 25; Creatinine, Ser 0.95; Hemoglobin 10.8; Platelets 257; Potassium 3.3; Sodium 144; TSH 9.040  Recent Lipid Panel    Component Value Date/Time   CHOL 142 07/31/2018 0655   TRIG 72 07/31/2018 0655   HDL 45 07/31/2018 0655   CHOLHDL 3.2 07/31/2018 0655   VLDL 14 07/31/2018 0655   LDLCALC 83 07/31/2018 0655     Risk Assessment/Calculations:   {Does this patient  have ATRIAL FIBRILLATION?[phone]}  No BP recorded.  {Refresh Note OR Click here to enter BP  :1}***         Physical Exam:    VS:  LMP 07/25/1988 Comment: full     Wt Readings from Last 3 Encounters:  05/15/23 112 lb (50.8 kg)  04/27/23 114 lb 9.6 oz (52 kg)  08/31/22 113 lb (51.3 kg)     GEN: *** Well nourished, well developed in no acute distress HEENT: Normal NECK: No JVD; No carotid bruits LYMPHATICS: No lymphadenopathy CARDIAC: ***RRR, no murmurs, rubs, gallops RESPIRATORY:  Clear to auscultation without rales, wheezing or rhonchi  ABDOMEN: Soft, non-tender, non-distended MUSCULOSKELETAL:  No edema; No deformity  SKIN: Warm and dry NEUROLOGIC:  Alert and oriented x 3 PSYCHIATRIC:  Normal affect   Assessment & Plan        {Are you ordering a CV Procedure (e.g. stress test, cath, DCCV, TEE, etc)?   Press F2        :[id number]}    Medication Adjustments/Labs and Tests Ordered: Current medicines are reviewed at length with the patient today.  Concerns regarding medicines are outlined above.  No orders of the defined types were placed in this encounter.  No orders of the defined types were placed in this encounter.   There are no Patient Instructions on file for this visit.   Signed, Tonny Bollman, MD  06/01/2023 8:24 AM    [facility]

## 2023-06-02 DIAGNOSIS — D649 Anemia, unspecified: Secondary | ICD-10-CM | POA: Diagnosis not present

## 2023-06-05 DIAGNOSIS — E039 Hypothyroidism, unspecified: Secondary | ICD-10-CM | POA: Diagnosis not present

## 2023-06-05 DIAGNOSIS — I509 Heart failure, unspecified: Secondary | ICD-10-CM | POA: Diagnosis not present

## 2023-06-05 DIAGNOSIS — I10 Essential (primary) hypertension: Secondary | ICD-10-CM | POA: Diagnosis not present

## 2023-06-05 DIAGNOSIS — I4891 Unspecified atrial fibrillation: Secondary | ICD-10-CM | POA: Diagnosis not present

## 2023-06-05 DIAGNOSIS — R296 Repeated falls: Secondary | ICD-10-CM | POA: Diagnosis not present

## 2023-06-05 DIAGNOSIS — I951 Orthostatic hypotension: Secondary | ICD-10-CM | POA: Diagnosis not present

## 2023-06-05 DIAGNOSIS — R350 Frequency of micturition: Secondary | ICD-10-CM | POA: Diagnosis not present

## 2023-06-05 DIAGNOSIS — K5900 Constipation, unspecified: Secondary | ICD-10-CM | POA: Diagnosis not present

## 2023-06-05 DIAGNOSIS — R1310 Dysphagia, unspecified: Secondary | ICD-10-CM | POA: Diagnosis not present

## 2023-06-05 DIAGNOSIS — K219 Gastro-esophageal reflux disease without esophagitis: Secondary | ICD-10-CM | POA: Diagnosis not present

## 2023-06-06 DIAGNOSIS — I5040 Unspecified combined systolic (congestive) and diastolic (congestive) heart failure: Secondary | ICD-10-CM | POA: Diagnosis not present

## 2023-06-06 DIAGNOSIS — I951 Orthostatic hypotension: Secondary | ICD-10-CM | POA: Diagnosis not present

## 2023-06-06 DIAGNOSIS — I480 Paroxysmal atrial fibrillation: Secondary | ICD-10-CM | POA: Diagnosis not present

## 2023-06-06 DIAGNOSIS — E039 Hypothyroidism, unspecified: Secondary | ICD-10-CM | POA: Diagnosis not present

## 2023-06-06 DIAGNOSIS — R296 Repeated falls: Secondary | ICD-10-CM | POA: Diagnosis not present

## 2023-06-08 DIAGNOSIS — I480 Paroxysmal atrial fibrillation: Secondary | ICD-10-CM | POA: Diagnosis not present

## 2023-06-08 DIAGNOSIS — I951 Orthostatic hypotension: Secondary | ICD-10-CM | POA: Diagnosis not present

## 2023-06-08 DIAGNOSIS — R42 Dizziness and giddiness: Secondary | ICD-10-CM | POA: Diagnosis not present

## 2023-06-12 DIAGNOSIS — I509 Heart failure, unspecified: Secondary | ICD-10-CM | POA: Diagnosis not present

## 2023-06-12 DIAGNOSIS — E039 Hypothyroidism, unspecified: Secondary | ICD-10-CM | POA: Diagnosis not present

## 2023-06-12 DIAGNOSIS — I10 Essential (primary) hypertension: Secondary | ICD-10-CM | POA: Diagnosis not present

## 2023-06-12 DIAGNOSIS — I951 Orthostatic hypotension: Secondary | ICD-10-CM | POA: Diagnosis not present

## 2023-06-12 DIAGNOSIS — K219 Gastro-esophageal reflux disease without esophagitis: Secondary | ICD-10-CM | POA: Diagnosis not present

## 2023-06-12 DIAGNOSIS — K5900 Constipation, unspecified: Secondary | ICD-10-CM | POA: Diagnosis not present

## 2023-06-12 DIAGNOSIS — I4891 Unspecified atrial fibrillation: Secondary | ICD-10-CM | POA: Diagnosis not present

## 2023-06-12 DIAGNOSIS — R1310 Dysphagia, unspecified: Secondary | ICD-10-CM | POA: Diagnosis not present

## 2023-06-14 DIAGNOSIS — R1310 Dysphagia, unspecified: Secondary | ICD-10-CM | POA: Diagnosis not present

## 2023-06-14 DIAGNOSIS — I110 Hypertensive heart disease with heart failure: Secondary | ICD-10-CM | POA: Diagnosis not present

## 2023-06-14 DIAGNOSIS — F0390 Unspecified dementia without behavioral disturbance: Secondary | ICD-10-CM | POA: Diagnosis not present

## 2023-06-14 DIAGNOSIS — S41101D Unspecified open wound of right upper arm, subsequent encounter: Secondary | ICD-10-CM | POA: Diagnosis not present

## 2023-06-14 DIAGNOSIS — Z9181 History of falling: Secondary | ICD-10-CM | POA: Diagnosis not present

## 2023-06-14 DIAGNOSIS — S41102D Unspecified open wound of left upper arm, subsequent encounter: Secondary | ICD-10-CM | POA: Diagnosis not present

## 2023-06-14 DIAGNOSIS — I951 Orthostatic hypotension: Secondary | ICD-10-CM | POA: Diagnosis not present

## 2023-06-14 DIAGNOSIS — Z85828 Personal history of other malignant neoplasm of skin: Secondary | ICD-10-CM | POA: Diagnosis not present

## 2023-06-14 DIAGNOSIS — E890 Postprocedural hypothyroidism: Secondary | ICD-10-CM | POA: Diagnosis not present

## 2023-06-14 DIAGNOSIS — S42302D Unspecified fracture of shaft of humerus, left arm, subsequent encounter for fracture with routine healing: Secondary | ICD-10-CM | POA: Diagnosis not present

## 2023-06-14 DIAGNOSIS — I509 Heart failure, unspecified: Secondary | ICD-10-CM | POA: Diagnosis not present

## 2023-06-14 DIAGNOSIS — I4891 Unspecified atrial fibrillation: Secondary | ICD-10-CM | POA: Diagnosis not present

## 2023-06-14 DIAGNOSIS — K219 Gastro-esophageal reflux disease without esophagitis: Secondary | ICD-10-CM | POA: Diagnosis not present

## 2023-06-14 DIAGNOSIS — Z7901 Long term (current) use of anticoagulants: Secondary | ICD-10-CM | POA: Diagnosis not present

## 2023-06-16 DIAGNOSIS — T148XXA Other injury of unspecified body region, initial encounter: Secondary | ICD-10-CM | POA: Diagnosis not present

## 2023-06-16 DIAGNOSIS — Z9981 Dependence on supplemental oxygen: Secondary | ICD-10-CM | POA: Diagnosis not present

## 2023-06-16 DIAGNOSIS — I10 Essential (primary) hypertension: Secondary | ICD-10-CM | POA: Diagnosis not present

## 2023-06-16 DIAGNOSIS — R296 Repeated falls: Secondary | ICD-10-CM | POA: Diagnosis not present

## 2023-06-16 DIAGNOSIS — S42302P Unspecified fracture of shaft of humerus, left arm, subsequent encounter for fracture with malunion: Secondary | ICD-10-CM | POA: Diagnosis not present

## 2023-06-16 DIAGNOSIS — R790 Abnormal level of blood mineral: Secondary | ICD-10-CM | POA: Diagnosis not present

## 2023-06-16 DIAGNOSIS — I951 Orthostatic hypotension: Secondary | ICD-10-CM | POA: Diagnosis not present

## 2023-06-16 DIAGNOSIS — R627 Adult failure to thrive: Secondary | ICD-10-CM | POA: Diagnosis not present

## 2023-06-16 DIAGNOSIS — R0989 Other specified symptoms and signs involving the circulatory and respiratory systems: Secondary | ICD-10-CM | POA: Diagnosis not present

## 2023-06-16 DIAGNOSIS — J449 Chronic obstructive pulmonary disease, unspecified: Secondary | ICD-10-CM | POA: Diagnosis not present

## 2023-06-16 DIAGNOSIS — E039 Hypothyroidism, unspecified: Secondary | ICD-10-CM | POA: Diagnosis not present

## 2023-06-16 DIAGNOSIS — R42 Dizziness and giddiness: Secondary | ICD-10-CM | POA: Diagnosis not present

## 2023-06-21 DIAGNOSIS — I4891 Unspecified atrial fibrillation: Secondary | ICD-10-CM | POA: Diagnosis not present

## 2023-06-21 DIAGNOSIS — M6281 Muscle weakness (generalized): Secondary | ICD-10-CM | POA: Diagnosis not present

## 2023-06-21 DIAGNOSIS — R627 Adult failure to thrive: Secondary | ICD-10-CM | POA: Diagnosis not present

## 2023-06-21 DIAGNOSIS — J449 Chronic obstructive pulmonary disease, unspecified: Secondary | ICD-10-CM | POA: Diagnosis not present

## 2023-06-21 DIAGNOSIS — R2681 Unsteadiness on feet: Secondary | ICD-10-CM | POA: Diagnosis not present

## 2023-06-21 DIAGNOSIS — K219 Gastro-esophageal reflux disease without esophagitis: Secondary | ICD-10-CM | POA: Diagnosis not present

## 2023-06-21 DIAGNOSIS — E039 Hypothyroidism, unspecified: Secondary | ICD-10-CM | POA: Diagnosis not present

## 2023-06-21 DIAGNOSIS — I951 Orthostatic hypotension: Secondary | ICD-10-CM | POA: Diagnosis not present

## 2023-06-21 DIAGNOSIS — I509 Heart failure, unspecified: Secondary | ICD-10-CM | POA: Diagnosis not present

## 2023-06-21 DIAGNOSIS — R1310 Dysphagia, unspecified: Secondary | ICD-10-CM | POA: Diagnosis not present

## 2023-06-23 DIAGNOSIS — I10 Essential (primary) hypertension: Secondary | ICD-10-CM | POA: Diagnosis not present

## 2023-06-23 DIAGNOSIS — I951 Orthostatic hypotension: Secondary | ICD-10-CM | POA: Diagnosis not present

## 2023-06-23 DIAGNOSIS — K219 Gastro-esophageal reflux disease without esophagitis: Secondary | ICD-10-CM | POA: Diagnosis not present

## 2023-06-23 DIAGNOSIS — I4891 Unspecified atrial fibrillation: Secondary | ICD-10-CM | POA: Diagnosis not present

## 2023-06-23 DIAGNOSIS — R1310 Dysphagia, unspecified: Secondary | ICD-10-CM | POA: Diagnosis not present

## 2023-06-23 DIAGNOSIS — E039 Hypothyroidism, unspecified: Secondary | ICD-10-CM | POA: Diagnosis not present

## 2023-06-23 DIAGNOSIS — I509 Heart failure, unspecified: Secondary | ICD-10-CM | POA: Diagnosis not present

## 2023-06-28 DIAGNOSIS — E039 Hypothyroidism, unspecified: Secondary | ICD-10-CM | POA: Diagnosis not present

## 2023-06-30 DIAGNOSIS — S42202D Unspecified fracture of upper end of left humerus, subsequent encounter for fracture with routine healing: Secondary | ICD-10-CM | POA: Diagnosis not present

## 2023-06-30 DIAGNOSIS — M79622 Pain in left upper arm: Secondary | ICD-10-CM | POA: Diagnosis not present

## 2023-07-01 DIAGNOSIS — I10 Essential (primary) hypertension: Secondary | ICD-10-CM | POA: Diagnosis not present

## 2023-07-03 DIAGNOSIS — I951 Orthostatic hypotension: Secondary | ICD-10-CM | POA: Diagnosis not present

## 2023-07-03 DIAGNOSIS — R4189 Other symptoms and signs involving cognitive functions and awareness: Secondary | ICD-10-CM | POA: Diagnosis not present

## 2023-07-06 ENCOUNTER — Encounter: Payer: PPO | Admitting: Obstetrics and Gynecology

## 2023-07-10 DIAGNOSIS — E032 Hypothyroidism due to medicaments and other exogenous substances: Secondary | ICD-10-CM | POA: Diagnosis not present

## 2023-07-10 DIAGNOSIS — I951 Orthostatic hypotension: Secondary | ICD-10-CM | POA: Diagnosis not present

## 2023-07-10 DIAGNOSIS — I480 Paroxysmal atrial fibrillation: Secondary | ICD-10-CM | POA: Diagnosis not present

## 2023-07-10 DIAGNOSIS — I10 Essential (primary) hypertension: Secondary | ICD-10-CM | POA: Diagnosis not present

## 2023-07-17 DIAGNOSIS — Z9181 History of falling: Secondary | ICD-10-CM | POA: Diagnosis not present

## 2023-07-17 DIAGNOSIS — S42202A Unspecified fracture of upper end of left humerus, initial encounter for closed fracture: Secondary | ICD-10-CM | POA: Diagnosis not present

## 2023-07-17 DIAGNOSIS — R1310 Dysphagia, unspecified: Secondary | ICD-10-CM | POA: Diagnosis not present

## 2023-07-17 DIAGNOSIS — I110 Hypertensive heart disease with heart failure: Secondary | ICD-10-CM | POA: Diagnosis not present

## 2023-07-17 DIAGNOSIS — I4891 Unspecified atrial fibrillation: Secondary | ICD-10-CM | POA: Diagnosis not present

## 2023-07-17 DIAGNOSIS — Z85828 Personal history of other malignant neoplasm of skin: Secondary | ICD-10-CM | POA: Diagnosis not present

## 2023-07-17 DIAGNOSIS — I951 Orthostatic hypotension: Secondary | ICD-10-CM | POA: Diagnosis not present

## 2023-07-17 DIAGNOSIS — Z7901 Long term (current) use of anticoagulants: Secondary | ICD-10-CM | POA: Diagnosis not present

## 2023-07-17 DIAGNOSIS — I509 Heart failure, unspecified: Secondary | ICD-10-CM | POA: Diagnosis not present

## 2023-07-17 DIAGNOSIS — E890 Postprocedural hypothyroidism: Secondary | ICD-10-CM | POA: Diagnosis not present

## 2023-07-17 DIAGNOSIS — S42302D Unspecified fracture of shaft of humerus, left arm, subsequent encounter for fracture with routine healing: Secondary | ICD-10-CM | POA: Diagnosis not present

## 2023-07-17 DIAGNOSIS — K219 Gastro-esophageal reflux disease without esophagitis: Secondary | ICD-10-CM | POA: Diagnosis not present

## 2023-07-21 DIAGNOSIS — K219 Gastro-esophageal reflux disease without esophagitis: Secondary | ICD-10-CM | POA: Diagnosis not present

## 2023-07-21 DIAGNOSIS — I10 Essential (primary) hypertension: Secondary | ICD-10-CM | POA: Diagnosis not present

## 2023-07-21 DIAGNOSIS — I4891 Unspecified atrial fibrillation: Secondary | ICD-10-CM | POA: Diagnosis not present

## 2023-07-21 DIAGNOSIS — I951 Orthostatic hypotension: Secondary | ICD-10-CM | POA: Diagnosis not present

## 2023-07-21 DIAGNOSIS — E039 Hypothyroidism, unspecified: Secondary | ICD-10-CM | POA: Diagnosis not present

## 2023-07-21 DIAGNOSIS — R1310 Dysphagia, unspecified: Secondary | ICD-10-CM | POA: Diagnosis not present

## 2023-07-21 DIAGNOSIS — I509 Heart failure, unspecified: Secondary | ICD-10-CM | POA: Diagnosis not present

## 2023-07-22 DIAGNOSIS — R296 Repeated falls: Secondary | ICD-10-CM | POA: Diagnosis not present

## 2023-07-24 DIAGNOSIS — I509 Heart failure, unspecified: Secondary | ICD-10-CM | POA: Diagnosis not present

## 2023-07-24 DIAGNOSIS — I4891 Unspecified atrial fibrillation: Secondary | ICD-10-CM | POA: Diagnosis not present

## 2023-07-24 DIAGNOSIS — E039 Hypothyroidism, unspecified: Secondary | ICD-10-CM | POA: Diagnosis not present

## 2023-07-24 DIAGNOSIS — I10 Essential (primary) hypertension: Secondary | ICD-10-CM | POA: Diagnosis not present

## 2023-07-24 DIAGNOSIS — I951 Orthostatic hypotension: Secondary | ICD-10-CM | POA: Diagnosis not present

## 2023-08-09 DIAGNOSIS — S41101A Unspecified open wound of right upper arm, initial encounter: Secondary | ICD-10-CM | POA: Diagnosis not present

## 2023-08-09 DIAGNOSIS — S41102A Unspecified open wound of left upper arm, initial encounter: Secondary | ICD-10-CM | POA: Diagnosis not present

## 2023-08-10 DIAGNOSIS — S42302P Unspecified fracture of shaft of humerus, left arm, subsequent encounter for fracture with malunion: Secondary | ICD-10-CM | POA: Diagnosis not present

## 2023-08-10 DIAGNOSIS — I110 Hypertensive heart disease with heart failure: Secondary | ICD-10-CM | POA: Diagnosis not present

## 2023-08-10 DIAGNOSIS — R296 Repeated falls: Secondary | ICD-10-CM | POA: Diagnosis not present

## 2023-08-10 DIAGNOSIS — D6869 Other thrombophilia: Secondary | ICD-10-CM | POA: Diagnosis not present

## 2023-08-10 DIAGNOSIS — R42 Dizziness and giddiness: Secondary | ICD-10-CM | POA: Diagnosis not present

## 2023-08-10 DIAGNOSIS — T148XXA Other injury of unspecified body region, initial encounter: Secondary | ICD-10-CM | POA: Diagnosis not present

## 2023-08-10 DIAGNOSIS — M0600 Rheumatoid arthritis without rheumatoid factor, unspecified site: Secondary | ICD-10-CM | POA: Diagnosis not present

## 2023-08-10 DIAGNOSIS — J449 Chronic obstructive pulmonary disease, unspecified: Secondary | ICD-10-CM | POA: Diagnosis not present

## 2023-08-10 DIAGNOSIS — I480 Paroxysmal atrial fibrillation: Secondary | ICD-10-CM | POA: Diagnosis not present

## 2023-08-10 DIAGNOSIS — I951 Orthostatic hypotension: Secondary | ICD-10-CM | POA: Diagnosis not present

## 2023-08-10 DIAGNOSIS — C50919 Malignant neoplasm of unspecified site of unspecified female breast: Secondary | ICD-10-CM | POA: Diagnosis not present

## 2023-08-10 DIAGNOSIS — I5042 Chronic combined systolic (congestive) and diastolic (congestive) heart failure: Secondary | ICD-10-CM | POA: Diagnosis not present

## 2023-08-15 DIAGNOSIS — I509 Heart failure, unspecified: Secondary | ICD-10-CM | POA: Diagnosis not present

## 2023-08-15 DIAGNOSIS — R1310 Dysphagia, unspecified: Secondary | ICD-10-CM | POA: Diagnosis not present

## 2023-08-15 DIAGNOSIS — S41102D Unspecified open wound of left upper arm, subsequent encounter: Secondary | ICD-10-CM | POA: Diagnosis not present

## 2023-08-15 DIAGNOSIS — Z7901 Long term (current) use of anticoagulants: Secondary | ICD-10-CM | POA: Diagnosis not present

## 2023-08-15 DIAGNOSIS — S41101D Unspecified open wound of right upper arm, subsequent encounter: Secondary | ICD-10-CM | POA: Diagnosis not present

## 2023-08-15 DIAGNOSIS — F0390 Unspecified dementia without behavioral disturbance: Secondary | ICD-10-CM | POA: Diagnosis not present

## 2023-08-15 DIAGNOSIS — I951 Orthostatic hypotension: Secondary | ICD-10-CM | POA: Diagnosis not present

## 2023-08-15 DIAGNOSIS — S42302D Unspecified fracture of shaft of humerus, left arm, subsequent encounter for fracture with routine healing: Secondary | ICD-10-CM | POA: Diagnosis not present

## 2023-08-15 DIAGNOSIS — I4891 Unspecified atrial fibrillation: Secondary | ICD-10-CM | POA: Diagnosis not present

## 2023-08-15 DIAGNOSIS — E890 Postprocedural hypothyroidism: Secondary | ICD-10-CM | POA: Diagnosis not present

## 2023-08-15 DIAGNOSIS — I110 Hypertensive heart disease with heart failure: Secondary | ICD-10-CM | POA: Diagnosis not present

## 2023-08-15 DIAGNOSIS — K219 Gastro-esophageal reflux disease without esophagitis: Secondary | ICD-10-CM | POA: Diagnosis not present

## 2023-08-25 DIAGNOSIS — S41101A Unspecified open wound of right upper arm, initial encounter: Secondary | ICD-10-CM | POA: Diagnosis not present

## 2023-08-25 DIAGNOSIS — S41102A Unspecified open wound of left upper arm, initial encounter: Secondary | ICD-10-CM | POA: Diagnosis not present

## 2023-08-31 DIAGNOSIS — S41102A Unspecified open wound of left upper arm, initial encounter: Secondary | ICD-10-CM | POA: Diagnosis not present

## 2023-08-31 DIAGNOSIS — S41101A Unspecified open wound of right upper arm, initial encounter: Secondary | ICD-10-CM | POA: Diagnosis not present

## 2023-09-05 ENCOUNTER — Ambulatory Visit: Payer: PPO | Admitting: Neurology

## 2023-09-07 DIAGNOSIS — S41101A Unspecified open wound of right upper arm, initial encounter: Secondary | ICD-10-CM | POA: Diagnosis not present

## 2023-09-07 DIAGNOSIS — S41102A Unspecified open wound of left upper arm, initial encounter: Secondary | ICD-10-CM | POA: Diagnosis not present

## 2023-09-28 DIAGNOSIS — J449 Chronic obstructive pulmonary disease, unspecified: Secondary | ICD-10-CM | POA: Diagnosis not present

## 2023-09-28 DIAGNOSIS — R42 Dizziness and giddiness: Secondary | ICD-10-CM | POA: Diagnosis not present

## 2023-09-28 DIAGNOSIS — E441 Mild protein-calorie malnutrition: Secondary | ICD-10-CM | POA: Diagnosis not present

## 2023-09-28 DIAGNOSIS — I5042 Chronic combined systolic (congestive) and diastolic (congestive) heart failure: Secondary | ICD-10-CM | POA: Diagnosis not present

## 2023-09-28 DIAGNOSIS — I10 Essential (primary) hypertension: Secondary | ICD-10-CM | POA: Diagnosis not present

## 2023-09-28 DIAGNOSIS — R0989 Other specified symptoms and signs involving the circulatory and respiratory systems: Secondary | ICD-10-CM | POA: Diagnosis not present

## 2023-09-28 DIAGNOSIS — S42302P Unspecified fracture of shaft of humerus, left arm, subsequent encounter for fracture with malunion: Secondary | ICD-10-CM | POA: Diagnosis not present

## 2023-09-28 DIAGNOSIS — I480 Paroxysmal atrial fibrillation: Secondary | ICD-10-CM | POA: Diagnosis not present

## 2023-09-28 DIAGNOSIS — Z9181 History of falling: Secondary | ICD-10-CM | POA: Diagnosis not present

## 2023-09-28 DIAGNOSIS — I110 Hypertensive heart disease with heart failure: Secondary | ICD-10-CM | POA: Diagnosis not present

## 2023-09-28 DIAGNOSIS — M0600 Rheumatoid arthritis without rheumatoid factor, unspecified site: Secondary | ICD-10-CM | POA: Diagnosis not present

## 2023-09-28 DIAGNOSIS — E039 Hypothyroidism, unspecified: Secondary | ICD-10-CM | POA: Diagnosis not present

## 2023-10-12 DIAGNOSIS — J449 Chronic obstructive pulmonary disease, unspecified: Secondary | ICD-10-CM | POA: Diagnosis not present

## 2023-10-12 DIAGNOSIS — I5042 Chronic combined systolic (congestive) and diastolic (congestive) heart failure: Secondary | ICD-10-CM | POA: Diagnosis not present

## 2023-10-12 DIAGNOSIS — E039 Hypothyroidism, unspecified: Secondary | ICD-10-CM | POA: Diagnosis not present

## 2023-10-12 DIAGNOSIS — R627 Adult failure to thrive: Secondary | ICD-10-CM | POA: Diagnosis not present

## 2023-10-12 DIAGNOSIS — Z9981 Dependence on supplemental oxygen: Secondary | ICD-10-CM | POA: Diagnosis not present

## 2023-10-12 DIAGNOSIS — I10 Essential (primary) hypertension: Secondary | ICD-10-CM | POA: Diagnosis not present

## 2023-10-12 DIAGNOSIS — R0989 Other specified symptoms and signs involving the circulatory and respiratory systems: Secondary | ICD-10-CM | POA: Diagnosis not present

## 2023-10-12 DIAGNOSIS — S42302P Unspecified fracture of shaft of humerus, left arm, subsequent encounter for fracture with malunion: Secondary | ICD-10-CM | POA: Diagnosis not present

## 2023-10-12 DIAGNOSIS — R2681 Unsteadiness on feet: Secondary | ICD-10-CM | POA: Diagnosis not present

## 2023-10-12 DIAGNOSIS — I480 Paroxysmal atrial fibrillation: Secondary | ICD-10-CM | POA: Diagnosis not present

## 2023-10-12 DIAGNOSIS — Z9181 History of falling: Secondary | ICD-10-CM | POA: Diagnosis not present

## 2023-10-12 DIAGNOSIS — R42 Dizziness and giddiness: Secondary | ICD-10-CM | POA: Diagnosis not present

## 2023-10-12 DIAGNOSIS — I951 Orthostatic hypotension: Secondary | ICD-10-CM | POA: Diagnosis not present

## 2023-10-12 DIAGNOSIS — I110 Hypertensive heart disease with heart failure: Secondary | ICD-10-CM | POA: Diagnosis not present

## 2023-10-18 ENCOUNTER — Ambulatory Visit: Payer: PPO | Attending: Cardiology | Admitting: Cardiology

## 2023-10-18 ENCOUNTER — Telehealth: Payer: Self-pay | Admitting: Cardiology

## 2023-10-19 ENCOUNTER — Encounter: Payer: Self-pay | Admitting: Cardiology

## 2023-10-24 ENCOUNTER — Other Ambulatory Visit: Payer: Self-pay | Admitting: Cardiology

## 2023-10-25 NOTE — Telephone Encounter (Signed)
 Patient's care has been transferred to [facility] in view of proximity.

## 2023-11-09 DIAGNOSIS — R079 Chest pain, unspecified: Secondary | ICD-10-CM | POA: Diagnosis not present

## 2023-11-09 DIAGNOSIS — R296 Repeated falls: Secondary | ICD-10-CM | POA: Diagnosis not present

## 2023-11-09 DIAGNOSIS — N1831 Chronic kidney disease, stage 3a: Secondary | ICD-10-CM | POA: Diagnosis not present

## 2023-11-09 DIAGNOSIS — Z79899 Other long term (current) drug therapy: Secondary | ICD-10-CM | POA: Diagnosis not present

## 2023-11-09 DIAGNOSIS — Z9049 Acquired absence of other specified parts of digestive tract: Secondary | ICD-10-CM | POA: Diagnosis not present

## 2023-11-09 DIAGNOSIS — Z7901 Long term (current) use of anticoagulants: Secondary | ICD-10-CM | POA: Diagnosis not present

## 2023-11-09 DIAGNOSIS — K449 Diaphragmatic hernia without obstruction or gangrene: Secondary | ICD-10-CM | POA: Diagnosis not present

## 2023-11-09 DIAGNOSIS — I6529 Occlusion and stenosis of unspecified carotid artery: Secondary | ICD-10-CM | POA: Diagnosis not present

## 2023-11-09 DIAGNOSIS — D320 Benign neoplasm of cerebral meninges: Secondary | ICD-10-CM | POA: Diagnosis not present

## 2023-11-09 DIAGNOSIS — I517 Cardiomegaly: Secondary | ICD-10-CM | POA: Diagnosis not present

## 2023-11-09 DIAGNOSIS — Z5329 Procedure and treatment not carried out because of patient's decision for other reasons: Secondary | ICD-10-CM | POA: Diagnosis not present

## 2023-11-09 DIAGNOSIS — I440 Atrioventricular block, first degree: Secondary | ICD-10-CM | POA: Diagnosis not present

## 2023-11-09 DIAGNOSIS — K5909 Other constipation: Secondary | ICD-10-CM | POA: Diagnosis not present

## 2023-11-09 DIAGNOSIS — I130 Hypertensive heart and chronic kidney disease with heart failure and stage 1 through stage 4 chronic kidney disease, or unspecified chronic kidney disease: Secondary | ICD-10-CM | POA: Diagnosis not present

## 2023-11-09 DIAGNOSIS — I080 Rheumatic disorders of both mitral and aortic valves: Secondary | ICD-10-CM | POA: Diagnosis not present

## 2023-11-09 DIAGNOSIS — Z96643 Presence of artificial hip joint, bilateral: Secondary | ICD-10-CM | POA: Diagnosis not present

## 2023-11-09 DIAGNOSIS — K922 Gastrointestinal hemorrhage, unspecified: Secondary | ICD-10-CM | POA: Diagnosis not present

## 2023-11-09 DIAGNOSIS — M8588 Other specified disorders of bone density and structure, other site: Secondary | ICD-10-CM | POA: Diagnosis not present

## 2023-11-09 DIAGNOSIS — R9082 White matter disease, unspecified: Secondary | ICD-10-CM | POA: Diagnosis not present

## 2023-11-09 DIAGNOSIS — M25552 Pain in left hip: Secondary | ICD-10-CM | POA: Diagnosis not present

## 2023-11-09 DIAGNOSIS — Z8673 Personal history of transient ischemic attack (TIA), and cerebral infarction without residual deficits: Secondary | ICD-10-CM | POA: Diagnosis not present

## 2023-11-09 DIAGNOSIS — W19XXXA Unspecified fall, initial encounter: Secondary | ICD-10-CM | POA: Diagnosis not present

## 2023-11-09 DIAGNOSIS — I480 Paroxysmal atrial fibrillation: Secondary | ICD-10-CM | POA: Diagnosis not present

## 2023-11-09 DIAGNOSIS — R0902 Hypoxemia: Secondary | ICD-10-CM | POA: Diagnosis not present

## 2023-11-09 DIAGNOSIS — I5022 Chronic systolic (congestive) heart failure: Secondary | ICD-10-CM | POA: Diagnosis not present

## 2023-11-09 DIAGNOSIS — Z85828 Personal history of other malignant neoplasm of skin: Secondary | ICD-10-CM | POA: Diagnosis not present

## 2023-11-09 DIAGNOSIS — R195 Other fecal abnormalities: Secondary | ICD-10-CM | POA: Diagnosis not present

## 2023-11-09 DIAGNOSIS — G319 Degenerative disease of nervous system, unspecified: Secondary | ICD-10-CM | POA: Diagnosis not present

## 2023-11-09 DIAGNOSIS — D638 Anemia in other chronic diseases classified elsewhere: Secondary | ICD-10-CM | POA: Diagnosis not present

## 2023-11-09 DIAGNOSIS — R54 Age-related physical debility: Secondary | ICD-10-CM | POA: Diagnosis not present

## 2023-11-09 DIAGNOSIS — Z96642 Presence of left artificial hip joint: Secondary | ICD-10-CM | POA: Diagnosis not present

## 2023-11-09 DIAGNOSIS — D509 Iron deficiency anemia, unspecified: Secondary | ICD-10-CM | POA: Diagnosis not present

## 2023-11-09 DIAGNOSIS — Z87891 Personal history of nicotine dependence: Secondary | ICD-10-CM | POA: Diagnosis not present

## 2023-11-09 DIAGNOSIS — I951 Orthostatic hypotension: Secondary | ICD-10-CM | POA: Diagnosis not present

## 2023-11-09 DIAGNOSIS — Z7401 Bed confinement status: Secondary | ICD-10-CM | POA: Diagnosis not present

## 2023-11-09 DIAGNOSIS — E039 Hypothyroidism, unspecified: Secondary | ICD-10-CM | POA: Diagnosis not present

## 2023-11-09 DIAGNOSIS — R42 Dizziness and giddiness: Secondary | ICD-10-CM | POA: Diagnosis not present

## 2023-11-09 DIAGNOSIS — R2681 Unsteadiness on feet: Secondary | ICD-10-CM | POA: Diagnosis not present

## 2023-11-09 DIAGNOSIS — R29818 Other symptoms and signs involving the nervous system: Secondary | ICD-10-CM | POA: Diagnosis not present

## 2023-11-09 DIAGNOSIS — D649 Anemia, unspecified: Secondary | ICD-10-CM | POA: Diagnosis not present

## 2023-11-09 DIAGNOSIS — K219 Gastro-esophageal reflux disease without esophagitis: Secondary | ICD-10-CM | POA: Diagnosis not present

## 2023-11-09 DIAGNOSIS — M25512 Pain in left shoulder: Secondary | ICD-10-CM | POA: Diagnosis not present

## 2023-11-09 DIAGNOSIS — J449 Chronic obstructive pulmonary disease, unspecified: Secondary | ICD-10-CM | POA: Diagnosis not present

## 2023-11-09 DIAGNOSIS — G4489 Other headache syndrome: Secondary | ICD-10-CM | POA: Diagnosis not present

## 2023-11-09 DIAGNOSIS — S0990XA Unspecified injury of head, initial encounter: Secondary | ICD-10-CM | POA: Diagnosis not present

## 2023-11-09 DIAGNOSIS — R198 Other specified symptoms and signs involving the digestive system and abdomen: Secondary | ICD-10-CM | POA: Diagnosis not present

## 2023-11-21 DIAGNOSIS — K219 Gastro-esophageal reflux disease without esophagitis: Secondary | ICD-10-CM | POA: Diagnosis not present

## 2023-11-21 DIAGNOSIS — I480 Paroxysmal atrial fibrillation: Secondary | ICD-10-CM | POA: Diagnosis not present
